# Patient Record
Sex: Male | Born: 1950 | Race: White | Hispanic: No | Smoking: Former smoker
Health system: Southern US, Community
[De-identification: ages and names within clinical notes are randomized; demographics above are authoritative.]

## PROBLEM LIST (undated history)

## (undated) DIAGNOSIS — I4821 Permanent atrial fibrillation: Secondary | ICD-10-CM

## (undated) DIAGNOSIS — I219 Acute myocardial infarction, unspecified: Secondary | ICD-10-CM

## (undated) DIAGNOSIS — J449 Chronic obstructive pulmonary disease, unspecified: Secondary | ICD-10-CM

## (undated) DIAGNOSIS — I4891 Unspecified atrial fibrillation: Secondary | ICD-10-CM

## (undated) DIAGNOSIS — K635 Polyp of colon: Secondary | ICD-10-CM

## (undated) DIAGNOSIS — E785 Hyperlipidemia, unspecified: Secondary | ICD-10-CM

## (undated) DIAGNOSIS — J8410 Pulmonary fibrosis, unspecified: Secondary | ICD-10-CM

## (undated) DIAGNOSIS — J45909 Unspecified asthma, uncomplicated: Secondary | ICD-10-CM

## (undated) DIAGNOSIS — K219 Gastro-esophageal reflux disease without esophagitis: Secondary | ICD-10-CM

## (undated) DIAGNOSIS — Z9289 Personal history of other medical treatment: Secondary | ICD-10-CM

## (undated) DIAGNOSIS — D649 Anemia, unspecified: Secondary | ICD-10-CM

## (undated) DIAGNOSIS — I2510 Atherosclerotic heart disease of native coronary artery without angina pectoris: Secondary | ICD-10-CM

## (undated) DIAGNOSIS — I10 Essential (primary) hypertension: Secondary | ICD-10-CM

## (undated) HISTORY — PX: CORONARY ARTERY BYPASS GRAFT: SHX141

## (undated) HISTORY — DX: Permanent atrial fibrillation: I48.21

## (undated) HISTORY — PX: CARDIAC CATHETERIZATION: SHX172

## (undated) HISTORY — PX: TESTICLE SURGERY: SHX794

## (undated) HISTORY — DX: Pulmonary fibrosis, unspecified: J84.10

## (undated) HISTORY — DX: Atherosclerotic heart disease of native coronary artery without angina pectoris: I25.10

## (undated) HISTORY — DX: Hyperlipidemia, unspecified: E78.5

## (undated) HISTORY — PX: COLONOSCOPY: SHX174

## (undated) HISTORY — DX: Morbid (severe) obesity due to excess calories: E66.01

## (undated) HISTORY — DX: Polyp of colon: K63.5

## (undated) HISTORY — PX: WRIST SURGERY: SHX841

## (undated) HISTORY — DX: Chronic obstructive pulmonary disease, unspecified: J44.9

## (undated) HISTORY — DX: Unspecified asthma, uncomplicated: J45.909

## (undated) HISTORY — PX: TUMOR EXCISION: SHX421

## (undated) HISTORY — DX: Anemia, unspecified: D64.9

## (undated) HISTORY — DX: Gastro-esophageal reflux disease without esophagitis: K21.9

## (undated) HISTORY — DX: Essential (primary) hypertension: I10

## (undated) HISTORY — DX: Personal history of other medical treatment: Z92.89

## (undated) HISTORY — PX: ANKLE SURGERY: SHX546

---

## 2004-10-07 ENCOUNTER — Emergency Department: Payer: Self-pay | Admitting: General Practice

## 2005-07-20 ENCOUNTER — Other Ambulatory Visit: Payer: Self-pay

## 2005-07-26 ENCOUNTER — Ambulatory Visit: Payer: Self-pay | Admitting: Urology

## 2006-08-04 ENCOUNTER — Emergency Department: Payer: Self-pay | Admitting: Emergency Medicine

## 2007-01-25 ENCOUNTER — Other Ambulatory Visit: Payer: Self-pay

## 2007-02-01 ENCOUNTER — Ambulatory Visit: Payer: Self-pay | Admitting: Orthopedic Surgery

## 2010-04-27 ENCOUNTER — Encounter: Payer: Self-pay | Admitting: Cardiovascular Disease

## 2010-04-27 LAB — CONVERTED CEMR LAB: Hgb A1c MFr Bld: 6.2 %

## 2010-05-18 HISTORY — PX: CARDIAC CATHETERIZATION: SHX172

## 2010-05-27 ENCOUNTER — Encounter: Payer: Self-pay | Admitting: Cardiovascular Disease

## 2010-05-27 LAB — CONVERTED CEMR LAB
ALT: 32 units/L
ALT: 49 units/L
AST: 49 units/L
Albumin: 3.9 g/dL
Alkaline Phosphatase: 67 units/L
BUN: 20 mg/dL
CO2: 29 meq/L
Chloride: 110 meq/L
Cholesterol: 105 mg/dL
Creatinine, Ser: 1.2 mg/dL
Eosinophils Relative: 0.2 %
GFR calc Af Amer: 79 mL/min
GFR calc non Af Amer: 66 mL/min
HCT: 42.9 %
HDL: 25 mg/dL
Hemoglobin: 14.2 g/dL
LDL Cholesterol: 31 mg/dL
Lymphocytes, automated: 4.1 %
MCV: 86 fL
Monocytes Relative: 0.7 %
Neutrophils Relative %: 5 %
Platelets: 183 10*3/uL
Potassium: 3.6 meq/L
RBC: 4.96 M/uL
Sodium: 144 meq/L
TSH: 2.59 microintl units/mL
Total Protein: 6.5 g/dL
Triglyceride fasting, serum: 249 mg/dL
WBC: 9.9 10*3/uL

## 2010-06-03 ENCOUNTER — Ambulatory Visit: Payer: Self-pay | Admitting: Cardiovascular Disease

## 2010-06-03 DIAGNOSIS — E785 Hyperlipidemia, unspecified: Secondary | ICD-10-CM | POA: Insufficient documentation

## 2010-06-03 DIAGNOSIS — R0602 Shortness of breath: Secondary | ICD-10-CM | POA: Insufficient documentation

## 2010-06-03 DIAGNOSIS — I4891 Unspecified atrial fibrillation: Secondary | ICD-10-CM | POA: Insufficient documentation

## 2010-06-03 DIAGNOSIS — E1169 Type 2 diabetes mellitus with other specified complication: Secondary | ICD-10-CM | POA: Insufficient documentation

## 2010-06-07 ENCOUNTER — Observation Stay: Payer: Self-pay | Admitting: Internal Medicine

## 2010-06-07 ENCOUNTER — Telehealth: Payer: Self-pay | Admitting: Cardiovascular Disease

## 2010-06-07 ENCOUNTER — Ambulatory Visit: Payer: Self-pay | Admitting: Cardiovascular Disease

## 2010-06-08 ENCOUNTER — Encounter: Payer: Self-pay | Admitting: Cardiovascular Disease

## 2010-06-09 ENCOUNTER — Encounter: Payer: Self-pay | Admitting: Cardiovascular Disease

## 2010-06-10 ENCOUNTER — Encounter: Payer: Self-pay | Admitting: Cardiovascular Disease

## 2010-06-16 ENCOUNTER — Ambulatory Visit: Payer: Self-pay | Admitting: Cardiovascular Disease

## 2010-06-16 DIAGNOSIS — I25111 Atherosclerotic heart disease of native coronary artery with angina pectoris with documented spasm: Secondary | ICD-10-CM | POA: Insufficient documentation

## 2010-06-23 ENCOUNTER — Emergency Department: Payer: Self-pay | Admitting: Hematology & Oncology

## 2010-06-27 ENCOUNTER — Encounter: Payer: Self-pay | Admitting: Cardiovascular Disease

## 2010-06-30 ENCOUNTER — Ambulatory Visit: Payer: Self-pay | Admitting: Cardiovascular Disease

## 2010-07-27 ENCOUNTER — Telehealth: Payer: Self-pay | Admitting: Cardiovascular Disease

## 2010-07-27 ENCOUNTER — Ambulatory Visit: Payer: Self-pay | Admitting: Internal Medicine

## 2010-07-27 ENCOUNTER — Inpatient Hospital Stay (HOSPITAL_COMMUNITY): Admission: EM | Admit: 2010-07-27 | Discharge: 2010-07-30 | Payer: Self-pay | Admitting: Emergency Medicine

## 2010-08-11 ENCOUNTER — Telehealth: Payer: Self-pay | Admitting: Cardiovascular Disease

## 2010-08-12 ENCOUNTER — Ambulatory Visit: Payer: Self-pay | Admitting: Cardiovascular Disease

## 2010-09-20 ENCOUNTER — Ambulatory Visit: Payer: Self-pay | Admitting: Cardiovascular Disease

## 2010-09-20 DIAGNOSIS — R0789 Other chest pain: Secondary | ICD-10-CM | POA: Insufficient documentation

## 2010-09-20 DIAGNOSIS — R079 Chest pain, unspecified: Secondary | ICD-10-CM | POA: Insufficient documentation

## 2010-09-21 ENCOUNTER — Encounter: Payer: Self-pay | Admitting: Cardiovascular Disease

## 2010-09-21 ENCOUNTER — Emergency Department: Payer: Self-pay | Admitting: Internal Medicine

## 2010-10-10 ENCOUNTER — Encounter: Payer: Self-pay | Admitting: Cardiovascular Disease

## 2010-10-10 ENCOUNTER — Ambulatory Visit: Payer: Self-pay | Admitting: Cardiovascular Disease

## 2011-01-17 NOTE — Letter (Signed)
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 [address]   [place], [place] [address]   Phone: [phone]  Fax: [phone]    09/20/2010  TO: Leodis Sias IT MAY CONCERN   RE: Ernest [name] [address] [facility] [address]   The above named individual is under my medical care and may return to work on: October 5,2011 without restrictions.   If you have any further questions or need additional information, please call.     Sincerely,     Dossie Arbour, MD

## 2011-01-17 NOTE — Letter (Signed)
Summary: Discharge Summary  Discharge Summary   Imported By: Park Breed 06/13/2010 08:38:40  _____________________________________________________________________  External Attachment:    Type:   Image     Comment:   External Document

## 2011-01-17 NOTE — Assessment & Plan Note (Signed)
Summary: F3W/AMD   Visit Type:  Follow-up Primary Provider:  Dossie Arbour  CC:  c/o always having chest pain;" somedays worse than others"..  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 Dossie Arbour, h/o chronic atrial fibrillation, diabetes, hypertension, OSA who does not use CPAP, hyperlipidemia, had a cardiac catheterization at [facility] 8 years ago at which time he was told his arteries are narrowing., With recent cardiac catheterization at [facility] , also with repeat cath at [facility] this summer for chest pain. He presents for routine followup.  On his last visit to the clinic, he had significant chest pain. He could not go to the emergency room that day and had no relief with nitroglycerin. He went to the emergency room the next day and had relief with morphine. He was started on Ranexa and continued on Imdur 90 mg daily. Since then, he reports very occasional episodes of chest discomfort and feels well. He is active, and working.  The cardiac cath in 2011 showed small tone/narrowed vessels.  mild dampening on engagement of the left main, improved after intracoronary nitroglycerin. 30% left main disease, diffuse 50% mid third LAD disease, with moderate diffuse plaquing. The first diagonal had 30% disease at the ostium. The circumflex had mild to moderate disease diffusely in the proximal to mid region, 60% stenosis in the mid third of the vessel. The RCA had diffuse 50% disease in the mid third of the vessel. Normal LV gram. Medical management was recommended.  EKG shows atrial fibrillation with ventricular rate 70s beats per minute, nonspecific ST and T wave changes in leads 2, 3, aVF  Current Medications (verified): 1)  Aspirin 81 Mg Tbec (Aspirin) .... Take One Tablet By Mouth Daily 2)  Atenolol 25 Mg Tabs (Atenolol) .... Take One Tablet By Mouth Daily 3)  Gabapentin 600 Mg Tabs  (Gabapentin) .... Take 2-3  Tablet By Mouth At Bedtime 4)  Metformin Hcl 500 Mg Tabs (Metformin Hcl) .... Take 1 Tablet By Mouth Two Times A Day 5)  Ramipril 5 Mg Caps (Ramipril) .... Take One Capsule By Mouth Daily 6)  Lipitor 40 Mg Tabs (Atorvastatin Calcium) .... Take One Tablet By Mouth Daily. 7)  Digoxin 0.25 Mg Tabs (Digoxin) .... Take Two Tablets X One Dose Today Then Take One Tablet By Mouth Daily 8)  Nitroglycerin 0.4 Mg/hr Pt24 (Nitroglycerin) .... As Needed 9)  Isosorbide Mononitrate Cr 30 Mg Xr24h-Tab (Isosorbide Mononitrate) .... Take Three Tablets Once Daily 10)  Amlodipine Besylate 10 Mg Tabs (Amlodipine Besylate) .... Take One Tablet By Mouth Daily 11)  Furosemide 40 Mg Tabs (Furosemide) .... Take 1 Tablet By Mouth Once A Day Can Take 1 Extra Dose Daily As Needed For Sob or Edema. 12)  Potassium Chloride Crys Cr 20 Meq Cr-Tabs (Potassium Chloride Crys Cr) .... Take One Tablet By Mouth Twice A Day 13)  Ranexa 500 Mg Xr12h-Tab (Ranolazine) .... One Tablet Once Daily 14)  Coumadin 5 Mg Tabs (Warfarin Sodium) .... Take As Directed 15)  Ranexa 1000 Mg Xr12h-Tab (Ranolazine) .... Take One Tablet By Mouth Twice A Day 16)  Prilosec 20 Mg Cpdr (Omeprazole) .... Take 1 Tablet By Mouth Once A Day  Allergies (verified): No Known Drug Allergies  Past History:  Past Medical History: Last updated: 06/16/2010 Hypertension Hyperlipidemia CAD Atrial Fibrillation G E R D sleep apnea Type II Diabetes  Past Surgical History: Last updated: 06/16/2010 wrist surg. benign  tumor neck and finger  Family History: Last updated: 06/03/2010 Family History of Alcoholism:  Family History of Coronary Artery Disease:  Family History of Depression:  Family History of Diabetes:  Family History of Hyperlipidemia:  Family History of Hypertension:   Social History: Last updated: 06/16/2010 Full Time Married  Tobacco Use - Former.  Alcohol Use - no Regular Exercise - no Drug Use -  no  Risk Factors: Exercise: no (06/03/2010)  Risk Factors: Smoking Status: quit (06/03/2010)  Review of Systems  The patient denies fever, weight loss, weight gain, vision loss, decreased hearing, hoarseness, chest pain, syncope, dyspnea on exertion, peripheral edema, prolonged cough, abdominal pain, incontinence, muscle weakness, depression, and enlarged lymph nodes.         Rare chest pains  Vital Signs:  Patient profile:   60 year old male Height:      70 inches Weight:      253 pounds BMI:     36.43 Pulse (ortho):   80 / minute BP sitting:   110 / 72  (left arm) Cuff size:   large  Vitals Entered By: Bishop Dublin, CMA (October 10, 2010 11:26 AM)  Physical Exam  General:  Well developed, well nourished, in no acute distress. Head:  normocephalic and atraumatic Neck:  Neck supple, no JVD. No masses, thyromegaly or abnormal cervical nodes. Lungs:  Clear bilaterally to auscultation and percussion. Heart:  Non-displaced PMI, chest non-tender; regular rate and rhythm, S1, S2 without murmurs, rubs or gallops. Carotid upstroke normal, no bruit.  Pedals normal pulses. Trace edema, no varicosities. Abdomen:   abdomen soft and non-tender without masses, obese Msk:  Back normal, normal gait. Muscle strength and tone normal. Pulses:  pulses normal in all 4 extremities Extremities:  No clubbing or cyanosis. Neurologic:  Alert and oriented x 3. Skin:  Intact without lesions or rashes. Psych:  Normal affect.   Impression & Recommendations:  Problem # 1:  CHEST PAIN-UNSPECIFIED (ICD-786.50) no significant chest pain since his last visit. He appears to be doing well on Ranexa and isosorbide. We will continue these medications.  His updated medication list for this problem includes:    Aspirin 81 Mg Tbec (Aspirin) .Marland Kitchen... Take one tablet by mouth daily    Atenolol 25 Mg Tabs (Atenolol) .Marland Kitchen... Take one tablet by mouth daily    Ramipril 5 Mg Caps (Ramipril) .Marland Kitchen... Take one capsule by  mouth daily    Nitroglycerin 0.4 Mg/hr Pt24 (Nitroglycerin) .Marland Kitchen... As needed    Isosorbide Mononitrate Cr 30 Mg Xr24h-tab (Isosorbide mononitrate) .Marland Kitchen... Take three tablets once daily    Amlodipine Besylate 10 Mg Tabs (Amlodipine besylate) .Marland Kitchen... Take one tablet by mouth daily    Ranexa 1000 Mg Xr12h-tab (Ranolazine) .Marland Kitchen... Take 1 tablet by mouth two times a day    Coumadin 5 Mg Tabs (Warfarin sodium) .Marland Kitchen... Take as directed    Ranexa 1000 Mg Xr12h-tab (Ranolazine) .Marland Kitchen... Take one tablet by mouth twice a day  Problem # 2:  CAD, NATIVE VESSEL (ICD-414.01) He does have mild diffuse disease with significant narrowing/small tapered vessels. Cardiac catheter x2 showing no lesions to intervene upon.  His updated medication list for this problem includes:    Aspirin 81 Mg Tbec (Aspirin) .Marland Kitchen... Take one tablet by mouth daily    Atenolol 25 Mg Tabs (Atenolol) .Marland Kitchen... Take one tablet by mouth daily    Ramipril 5 Mg Caps (Ramipril) .Marland Kitchen... Take one capsule by mouth daily    Nitroglycerin 0.4 Mg/hr Pt24 (Nitroglycerin) .Marland Kitchen... As  needed    Isosorbide Mononitrate Cr 30 Mg Xr24h-tab (Isosorbide mononitrate) .Marland Kitchen... Take three tablets once daily    Amlodipine Besylate 10 Mg Tabs (Amlodipine besylate) .Marland Kitchen... Take one tablet by mouth daily    Ranexa 1000 Mg Xr12h-tab (Ranolazine) .Marland Kitchen... Take 1 tablet by mouth two times a day    Coumadin 5 Mg Tabs (Warfarin sodium) .Marland Kitchen... Take as directed    Ranexa 1000 Mg Xr12h-tab (Ranolazine) .Marland Kitchen... Take one tablet by mouth twice a day  Problem # 3:  HYPERLIPIDEMIA-MIXED (ICD-272.4) We'll continue him on Lipitor and this is becoming generic medication later this year.  His updated medication list for this problem includes:    Lipitor 40 Mg Tabs (Atorvastatin calcium) .Marland Kitchen... Take one tablet by mouth daily.  Problem # 4:  ATRIAL FIBRILLATION (ICD-427.31) He has good rate control. He is on anticoagulation. We have mentioned to him that if he would like to consider a cardioversion next  month, if he has shortness of breath, this could be done. He would like to think about this.  His updated medication list for this problem includes:    Aspirin 81 Mg Tbec (Aspirin) .Marland Kitchen... Take one tablet by mouth daily    Atenolol 25 Mg Tabs (Atenolol) .Marland Kitchen... Take one tablet by mouth daily    Digoxin 0.25 Mg Tabs (Digoxin) .Marland Kitchen... Take two tablets x one dose today then take one tablet by mouth daily    Coumadin 5 Mg Tabs (Warfarin sodium) .Marland Kitchen... Take as directed  Patient Instructions: 1)  Your physician has recommended you make the following change in your medication: INCREASE ranexa 1000mg  two times a day  2)  Your physician wants you to follow-up in:   6 months You will receive a reminder letter in the mail two months in advance. If you don't receive a letter, please call our office to schedule the follow-up appointment. Prescriptions: RANEXA 1000 MG XR12H-TAB (RANOLAZINE) Take 1 tablet by mouth two times a day  #60 x 12   Entered by:   Benedict Needy, RN   Authorized by:   Dossie Arbour MD   Signed by:   Benedict Needy, RN on 10/10/2010   Method used:   Electronically to        [facility]. # [phone]* (retail)       [address]       [place], [place]  [address]       Ph: [phone]       Fax: [phone]   RxID:   [id number]

## 2011-01-17 NOTE — Assessment & Plan Note (Signed)
Summary: F/U POST ER/ALT   Visit Type:  post ER Primary Provider:  Dossie Arbour  CC:  chest pain everyday....sob does says it is better though now that he is on Lasix....no edema.  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  The cardiac catheterization showed a left coronary system that had small tone/narrowed vessels. There was mild dampening on engagement of the left main, improved after intracoronary nitroglycerin. There was 30% left main disease, diffuse 50% mid third LAD disease, with moderate diffuse plaquing. The first diagonal had 30% disease at the ostium. The circumflex had mild to moderate disease diffusely in the proximal to mid region, 60% stenosis in the mid third of the vessel. The RCA had diffuse 50% disease in the mid third of the vessel. Normal LV gram, mild mitral regurgitation. Medical management was recommended with the start of long-acting nitroglycerin.  He was recently evaluated in the emergency room after a episode of significant shortness of breath with some syncope. He reports at nighttime having shortness of breath and having to breathe very hard. In the emergency room, I discussed the case with the hospitalist and his Imdur was increased to b.i.d. and Lasix was continued on a daily basis.  Today he reports having continued shortness of breath though it is improved. He is very hot at work, working in [employer] and has drink plenty of fluids. He is able to sleep better, does have significant snoring but does not use his CPAP.  EKG shows atrial  fibrillation with ventricular rate 62 beats per minute, ST-T wave changes seen in V1 through V6, 2, 3, aVF  Current Medications (verified): 1)  Aspirin 81 Mg Tbec (Aspirin) .... Take One Tablet By Mouth Daily 2)  Atenolol 25 Mg Tabs (Atenolol) .... Take One Tablet By Mouth Daily 3)  Gabapentin 600 Mg Tabs (Gabapentin) .... Take 2-3  Tablet By Mouth At Bedtime 4)  Metformin Hcl 500 Mg Tabs (Metformin Hcl) .... Take 1 Tablet By Mouth Two Times A Day 5)  Ramipril 5 Mg Caps (Ramipril) .... Take One Capsule By Mouth Daily 6)  Lipitor 40 Mg Tabs (Atorvastatin Calcium) .... Take One Tablet By Mouth Daily. 7)  Pradaxa 150 Mg Caps (Dabigatran Etexilate Mesylate) .... Take 1 Tablet By Mouth Two Times A Day 8)  Digoxin 0.25 Mg Tabs (Digoxin) .... Take Two Tablets X One Dose Today Then Take One Tablet By Mouth Daily 9)  Nitroglycerin 0.4 Mg/hr Pt24 (Nitroglycerin) .... As Needed 10)  Imdur 30 Mg Xr24h-Tab (Isosorbide Mononitrate) .Marland Kitchen.. 1 Once Daily 11)  Isosorbide Mononitrate Cr 30 Mg Xr24h-Tab (Isosorbide Mononitrate) .... Take 1 Tablet By Mouth Two Times A Day 12)  Amlodipine Besylate 10 Mg Tabs (Amlodipine Besylate) .... Take One Tablet By Mouth Daily 13)  Furosemide 20 Mg Tabs (Furosemide) .Marland Kitchen.. 1 Tab Once Daily  Allergies: No Known Drug Allergies  Past History:  Past Surgical History: Last updated: 06/16/2010 wrist surg. benign tumor neck and finger  Review of Systems       The patient complains of syncope, dyspnea on exertion, and peripheral edema.  The patient denies fever, weight loss, weight gain, vision  loss, decreased hearing, hoarseness, chest pain, prolonged cough, abdominal pain, incontinence, muscle weakness, depression, and enlarged lymph nodes.    Vital Signs:  Patient profile:   60 year old male Height:      70 inches Weight:      268 pounds BMI:     38.59 Pulse rate:   62 / minute Pulse rhythm:   irregular BP sitting:   157 / 76  (left arm) Cuff size:   large  Vitals  Entered By: Danielle Rankin, CMA (June 30, 2010 10:38 AM)  Physical Exam  General:  Well developed, well nourished, in no acute distress. Head:  normocephalic and atraumatic Neck:  Neck supple, no JVD. No masses, thyromegaly or abnormal cervical nodes. Lungs:  Clear bilaterally to auscultation and percussion. Heart:  Non-displaced PMI, chest non-tender; regular rate and rhythm, S1, S2 without murmurs, rubs or gallops. Carotid upstroke normal, no bruit. Pedals normal pulses. 1 to 2+ edema LE b/l, no varicosities. Abdomen:  Bowel sounds positive; abdomen soft and non-tender without masses Msk:  Back normal, normal gait. Muscle strength and tone normal. Pulses:  pulses normal in all 4 extremities Extremities:  No clubbing or cyanosis. Neurologic:  Alert and oriented x 3. Skin:  Intact without lesions or rashes. Psych:  Normal affect.    Impression & Recommendations:  Problem # 1:  SHORTNESS OF BREATH (ICD-786.05) shortness of breath is likely due to underlying diastolic dysfunction from his atrial fibrillation. We will increase his Lasix to 20 mg b.i.d. and add potassium 20 mEq b.i.d. We have talked to him about salt and water/fluid intake and that he needs to check his legs for edema to determine when he can go back to one water pill a day.  The following medications were removed from the medication list:    Aspirin 81 Mg Tbec (Aspirin) .Marland Kitchen... Take one tablet by mouth daily His updated medication list for this problem includes:    Aspirin 81 Mg Tbec (Aspirin) .Marland Kitchen... Take one tablet by mouth daily    Atenolol 25 Mg Tabs (Atenolol) .Marland Kitchen... Take one tablet by mouth daily    Ramipril 5 Mg Caps (Ramipril) .Marland Kitchen... Take one capsule by mouth daily    Digoxin 0.25 Mg Tabs (Digoxin) .Marland Kitchen... Take two tablets x one dose today then take one tablet by mouth daily    Amlodipine Besylate 10 Mg Tabs (Amlodipine besylate) .Marland Kitchen... Take one tablet by mouth daily    Furosemide 20 Mg Tabs (Furosemide) .Marland Kitchen... Take 1  tablet by mouth two times a day  Problem # 2:  ATRIAL FIBRILLATION (ICD-427.31) We discussed cardioversion. He does have obstructive sleep apnea and has fluid overload currently. He would likely not hold his sinus rhythm if we need to diuresis him more. He is not interested in going on his CPAP on a consistent basis. This would likely make him prone to convert back to atrial fibrillation in the future. He will call us in the next few weeks if you would like to consider cardioversion. He needs to have a foot surgery in the near future and will need to come off his anticoagulation.  The following medications were removed from the medication list:    Aspirin 81 Mg Tbec (Aspirin) .Marland Kitchen... Take one tablet by mouth daily His updated medication list for this problem includes:    Aspirin 81 Mg Tbec (Aspirin) .Marland Kitchen... Take one tablet by mouth daily    Atenolol 25 Mg Tabs (Atenolol) .Marland Kitchen... Take one tablet by mouth daily  Digoxin 0.25 Mg Tabs (Digoxin) .Marland Kitchen... Take two tablets x one dose today then take one tablet by mouth daily  Problem # 3:  CAD, NATIVE VESSEL (ICD-414.01) He has improved chest discomfort on the higher dose isosorbide b.i.d. We'll continue to watch him and treat him aggressively  The following medications were removed from the medication list:    Aspirin 81 Mg Tbec (Aspirin) .Marland Kitchen... Take one tablet by mouth daily His updated medication list for this problem includes:    Aspirin 81 Mg Tbec (Aspirin) .Marland Kitchen... Take one tablet by mouth daily    Atenolol 25 Mg Tabs (Atenolol) .Marland Kitchen... Take one tablet by mouth daily    Ramipril 5 Mg Caps (Ramipril) .Marland Kitchen... Take one capsule by mouth daily    Nitroglycerin 0.4 Mg/hr Pt24 (Nitroglycerin) .Marland Kitchen... As needed    Imdur 30 Mg Xr24h-tab (Isosorbide mononitrate) .Marland Kitchen... 1 once daily    Isosorbide Mononitrate Cr 30 Mg Xr24h-tab (Isosorbide mononitrate) .Marland Kitchen... Take 1 tablet by mouth two times a day    Amlodipine Besylate 10 Mg Tabs (Amlodipine besylate) .Marland Kitchen... Take one tablet  by mouth daily  Problem # 4:  HYPERLIPIDEMIA-MIXED (ICD-272.4) Goal LDL is less than 70, total cholesterol less than 150. We'll continue the Lipitor for now  His updated medication list for this problem includes:    Lipitor 40 Mg Tabs (Atorvastatin calcium) .Marland Kitchen... Take one tablet by mouth daily.  Patient Instructions: 1)  Your physician recommends that you schedule a follow-up appointment in: 6 months 2)  Your physician has recommended you make the following change in your medication: change furosemide to two times a day and start Potassium 20 MEQ two times a day  Prescriptions: POTASSIUM CHLORIDE CRYS CR 20 MEQ CR-TABS (POTASSIUM CHLORIDE CRYS CR) Take one tablet by mouth twice a day  #60 x 6   Entered by:   Hardin Negus, RMA   Authorized by:   Dossie Arbour MD   Signed by:   Hardin Negus, RMA on 06/30/2010   Method used:   Electronically to        [facility]. # [phone]* (retail)       [address]       [place], [place]  [address]       Ph: [phone]       Fax: [phone]   RxID:   [id number] FUROSEMIDE 20 MG TABS (FUROSEMIDE) Take 1 tablet by mouth two times a day  #60 x 6   Entered by:   Hardin Negus, RMA   Authorized by:   Dossie Arbour MD   Signed by:   Hardin Negus, RMA on 06/30/2010   Method used:   Electronically to        [facility]. # [phone]* (retail)       [address]       [place], [place]  [address]       Ph: [phone]       Fax: [phone]   RxID:   [id number]

## 2011-01-17 NOTE — Letter (Signed)
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 [address]   [place], [place] [address]   Phone: [phone]  Fax: [phone]    06/10/2010  TO: Leodis Sias IT MAY CONCERN   RE: Ernest [name] [address] [id number]   The above named individual is under my medical care and may return to work on:  June 13, 2010 with no restrictions.  After cardiac catherization on June 21,2011 no heavy lifting for 1 week.   If you have any further questions or need additional information, please call.     Sincerely,      Dossie Arbour, MD

## 2011-01-17 NOTE — Letter (Signed)
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 [address]   [place], [place] [address]   Phone: [phone]  Fax: [phone]    06/27/2010  TO: Leodis Sias IT MAY CONCERN   RE: Ernest [name] [address] [id number]   The above named individual is under my medical care and may return to work on: June 27, 2010 with no restrictions  If you have any further questions or need additional information, please call.     Sincerely,      Dossie Arbour, MD

## 2011-01-17 NOTE — Consult Note (Signed)
SummaryScientist, physiological [facility]   [facility]   Imported By: Roderic Ovens 10/03/2010 10:58:13  _____________________________________________________________________  External Attachment:    Type:   Image     Comment:   External Document

## 2011-01-17 NOTE — Letter (Signed)
Summary: PHI  PHI   Imported By: Harlon Flor 06/08/2010 08:32:50  _____________________________________________________________________  External Attachment:    Type:   Image     Comment:   External Document

## 2011-01-17 NOTE — Cardiovascular Report (Signed)
Summary: Cardiac Cath Other  Cardiac Cath Other   Imported By: West Carbo 06/16/2010 07:41:52  _____________________________________________________________________  External Attachment:    Type:   Image     Comment:   External Document

## 2011-01-17 NOTE — Medication Information (Signed)
Summary: Tax adviser   Imported By: West Carbo 06/16/2010 14:28:55  _____________________________________________________________________  External Attachment:    Type:   Image     Comment:   External Document

## 2011-01-17 NOTE — Progress Notes (Signed)
  Phone Note Call from Patient   Caller: Patient Call For: [facility] Summary of Call: PT called is having chest pain that started last night is also having numbness in hand.  Advised him to go to ER to be evaluated.  Initial call taken by: Benedict Needy, RN,  June 07, 2010 1:19 PM     Appended Document:  Seen at [facility]. Cath on Thursday

## 2011-01-17 NOTE — Progress Notes (Signed)
Summary: Chest pain  Phone Note Call from Patient   Caller: Patient Call For: [place] Summary of Call: Pt called reporting sharpe chest pain, nausea and tingling in left hand. The pain is worse when he is working and moving but does not completly go away with rest.  Has prescription for nitro but pt does not have the medication with him. Asked pt to go to ER for evaluation.  Initial call taken by: Benedict Needy, RN,  July 27, 2010 12:16 PM     Appended Document: Chest pain At [facility] for cath tomrrow

## 2011-01-17 NOTE — Assessment & Plan Note (Signed)
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 Dossie [name], h/o recent chronic atrial fibrillation, diabetes, hypertension, OSA, hyperlipidemia, had a cardiac catheterization at [facility] 8 years ago at which time he was told his arteries are narrowing., With recent cardiac catheterization at [facility] , also with repeat cath at [facility] this past month for chest pain. He presents for routine followup.  he states that since he has left the hospital, he has been feeling well. He denies having to use nitroglycerin for chest pain. He is tolerating his medications well. He sometimes has lower extremity edema, sometimes shortness of breath. He has been back at work and just goes slower as not able to keep his usual.  The cardiac catheterization showed a left coronary system that had small tone/narrowed vessels. There was mild dampening on engagement of the left main, improved after intracoronary nitroglycerin. There was 30% left main disease, diffuse 50% mid third LAD disease, with moderate diffuse plaquing. The first diagonal had 30% disease at the ostium. The circumflex had mild to moderate disease diffusely in the proximal to mid region, 60% stenosis in the mid third of the vessel. The RCA had diffuse 50% disease in the mid third of the vessel. Normal LV gram, mild mitral regurgitation. Medical management was recommended with the start of long-acting nitroglycerin.   EKG shows atrial fibrillation with ventricular rate 60 beats per minute, nonspecific ST and T wave changes in leads 2, 3, aVF  Current Medications (verified): 1)  Aspirin 81 Mg Tbec (Aspirin) .... Take One Tablet By Mouth Daily 2)  Atenolol 25 Mg Tabs (Atenolol) .... Take One Tablet By Mouth Daily 3)  Gabapentin 600 Mg  Tabs (Gabapentin) .... Take 2-3  Tablet By Mouth At Bedtime 4)  Metformin Hcl 500 Mg Tabs (Metformin Hcl) .... Take 1 Tablet By Mouth Two Times A Day 5)  Ramipril 5 Mg Caps (Ramipril) .... Take One Capsule By Mouth Daily 6)  Lipitor 40 Mg Tabs (Atorvastatin Calcium) .... Take One Tablet By Mouth Daily. 7)  Digoxin 0.25 Mg Tabs (Digoxin) .... Take Two Tablets X One Dose Today Then Take One Tablet By Mouth Daily 8)  Nitroglycerin 0.4 Mg/hr Pt24 (Nitroglycerin) .... As Needed 9)  Isosorbide Mononitrate Cr 30 Mg Xr24h-Tab (Isosorbide Mononitrate) .... Take Three Tablets Once Daily 10)  Amlodipine Besylate 10 Mg Tabs (Amlodipine Besylate) .... Take One Tablet By Mouth Daily 11)  Furosemide 40 Mg Tabs (Furosemide) .... Take One Tablet Once Daily 12)  Potassium Chloride Crys Cr 20 Meq Cr-Tabs (Potassium Chloride Crys Cr) .... Take One Tablet By Mouth Twice A Day 13)  Ranexa 500 Mg Xr12h-Tab (Ranolazine) .... One Tablet Once Daily 14)  Coumadin 6 Mg Tabs (Warfarin Sodium) .... One Tablet Once Daily  Allergies (verified): No Known Drug Allergies  Past History:  Past Medical History: Last updated: 06/16/2010 Hypertension Hyperlipidemia CAD Atrial Fibrillation G E R D sleep apnea Type II Diabetes  Past Surgical History: Last updated: 06/16/2010 wrist surg. benign tumor neck and finger  Family History: Last updated: 06/03/2010 Family History of Alcoholism:  Family History of Coronary Artery Disease:  Family History of Depression:  Family History of Diabetes:  Family History of Hyperlipidemia:  Family History of Hypertension:   Social History: Last updated: 06/16/2010 Full Time Married  Tobacco  Use - Former.  Alcohol Use - no Regular Exercise - no Drug Use - no  Risk Factors: Exercise: no (06/03/2010)  Risk Factors: Smoking Status: quit (06/03/2010)  Review of Systems       The patient complains of dyspnea on exertion and peripheral edema.  The patient denies fever,  weight loss, weight gain, vision loss, decreased hearing, hoarseness, chest pain, syncope, prolonged cough, abdominal pain, incontinence, muscle weakness, depression, and enlarged lymph nodes.    Vital Signs:  Patient profile:   60 year old male Height:      70 inches Weight:      258 pounds BMI:     37.15 Pulse rate:   75 / minute BP sitting:   116 / 73  (left arm) Cuff size:   large  Vitals Entered By: Bishop Dublin, CMA (August 12, 2010 10:17 AM)  Physical Exam  General:  Well developed, well nourished, in no acute distress. Obese Head:  normocephalic and atraumatic Neck:  Neck supple, no JVD. No masses, thyromegaly or abnormal cervical nodes. Lungs:  Clear bilaterally to auscultation and percussion. Heart:  Non-displaced PMI, chest non-tender; regular rate and rhythm, S1, S2 without murmurs, rubs or gallops. Carotid upstroke normal, no bruit.  Pedals normal pulses. Trace edema, no varicosities. Abdomen:   abdomen soft and non-tender without masses Msk:  Back normal, normal gait. Muscle strength and tone normal. Pulses:  pulses normal in all 4 extremities Extremities:  No clubbing or cyanosis. Neurologic:  Alert and oriented x 3. Skin:  Intact without lesions or rashes. Psych:  Normal affect.   Impression & Recommendations:  Problem # 1:  CAD, NATIVE VESSEL (ICD-414.01) we will continue him on aggressive lipid management, nitrates, ranexa for small vessel disease, decreased tone of the coronary arterie also with suspected coronary spasm. Symptoms are relatively stable. We will increase his ranexa to 1000 mg b.i.d. with a co-pay Card.  The following medications were removed from the medication list:    Imdur 30 Mg Xr24h-tab (Isosorbide mononitrate) .Marland Kitchen... 1 once daily His updated medication list for this problem includes:    Aspirin 81 Mg Tbec (Aspirin) .Marland Kitchen... Take one tablet by mouth daily    Atenolol 25 Mg Tabs (Atenolol) .Marland Kitchen... Take one tablet by mouth daily    Ramipril 5  Mg Caps (Ramipril) .Marland Kitchen... Take one capsule by mouth daily    Nitroglycerin 0.4 Mg/hr Pt24 (Nitroglycerin) .Marland Kitchen... As needed    Isosorbide Mononitrate Cr 30 Mg Xr24h-tab (Isosorbide mononitrate) .Marland Kitchen... Take three tablets once daily    Amlodipine Besylate 10 Mg Tabs (Amlodipine besylate) .Marland Kitchen... Take one tablet by mouth daily    Ranexa 500 Mg Xr12h-tab (Ranolazine) ..... One tablet once daily    Coumadin 6 Mg Tabs (Warfarin sodium) ..... One tablet once daily    Ranexa 1000 Mg Xr12h-tab (Ranolazine) .Marland Kitchen... Take one tablet by mouth twice a day  Orders: EKG w/ Interpretation (93000)  Problem # 2:  HYPERLIPIDEMIA-MIXED (ICD-272.4) Goal LDL is less than 70, total cholesterol less than 150.  His updated medication list for this problem includes:    Lipitor 40 Mg Tabs (Atorvastatin calcium) .Marland Kitchen... Take one tablet by mouth daily.  Problem # 3:  ATRIAL FIBRILLATION (ICD-427.31) He continues to be in atrial fibrillation with rate controlled. No significant symptoms. We will not pursue cardioversion at this time as he is asymptomatic and I am concerned that it would not hold.  His updated medication list for this problem includes:    Aspirin 81 Mg  Tbec (Aspirin) .Marland Kitchen... Take one tablet by mouth daily    Atenolol 25 Mg Tabs (Atenolol) .Marland Kitchen... Take one tablet by mouth daily    Digoxin 0.25 Mg Tabs (Digoxin) .Marland Kitchen... Take two tablets x one dose today then take one tablet by mouth daily    Coumadin 6 Mg Tabs (Warfarin sodium) ..... One tablet once daily  Orders: EKG w/ Interpretation (93000)  Problem # 4:  SHORTNESS OF BREATH (ICD-786.05) He does not have a smoking history. I suspect his shortness of breath is due to underlying obesity, deconditioning, mild fluid overload with diastolic dysfunction  His updated medication list for this problem includes:    Aspirin 81 Mg Tbec (Aspirin) .Marland Kitchen... Take one tablet by mouth daily    Atenolol 25 Mg Tabs (Atenolol) .Marland Kitchen... Take one tablet by mouth daily    Ramipril 5 Mg  Caps (Ramipril) .Marland Kitchen... Take one capsule by mouth daily    Digoxin 0.25 Mg Tabs (Digoxin) .Marland Kitchen... Take two tablets x one dose today then take one tablet by mouth daily    Amlodipine Besylate 10 Mg Tabs (Amlodipine besylate) .Marland Kitchen... Take one tablet by mouth daily    Furosemide 40 Mg Tabs (Furosemide) .Marland Kitchen... Take 1 tablet by mouth once a day can take 1 extra dose daily as needed for sob or edema.  Patient Instructions: 1)  Your physician has recommended you make the following change in your medication: START ranexa, can also take extra dose of lasix for sob or edema.  2)  Your physician wants you to follow-up in:   6 months You will receive a reminder letter in the mail two months in advance. If you don't receive a letter, please call our office to schedule the follow-up appointment. Prescriptions: RANEXA 1000 MG XR12H-TAB (RANOLAZINE) Take one tablet by mouth twice a day  #60 x 4   Entered by:   Benedict Needy, RN   Authorized by:   Ernest Arbour MD   Signed by:   Benedict Needy, RN on 08/12/2010   Method used:   Electronically to        [facility] [address] (retail)       [address], [address]       [place], [place]  [address]       Ph: [phone]       Fax: [phone]   RxID:   [id number]

## 2011-01-17 NOTE — Progress Notes (Signed)
Summary: rx verification  Phone Note From Pharmacy   Caller: Patient Reason for Call: Talk to Nurse Caller: Candice Camp. # [phone]* Summary of Call: pt's rx from 8-13 for lasix-was written for 1 tab to take an extra 1/2 tab the 13th, 14th and 15th, however he just brought it in today-so they want to verfiy the instructions-pls call [phone] Initial call taken by: Glynda Jaeger,  August 11, 2010 2:09 PM  Follow-up for Phone Call        discharge paperwork says  .Lasix 40 mg p.o. daily (please take 20 mg p.o. q.p.m. for the next     3 days, July 30, 2010, July 31, 2010, and August 01, 2010).     Appended Document: rx verification attempted to call back line busy  Appended Document: rx verification pharmacy aware of MD recomendation for furosemide

## 2011-01-17 NOTE — Assessment & Plan Note (Signed)
Summary: POST HOSPITAL F/U 1 WEEK   Visit Type:  Follow-up Primary Provider:  Crissman  CC:  c/o chest pain .  History of Present Illness: 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 Given his stuttering symptoms of pain, he had a cardiac catheter.  The cardiac catheterization showed a left coronary system that had small tone/narrowed vessels. There was mild dampening on engagement of the left main, improved after intracoronary nitroglycerin. There was 30% left main disease, diffuse 50% mid third LAD disease, with moderate diffuse plaquing. The first diagonal had 30% disease at the ostium. The circumflex had mild to moderate disease diffusely in the proximal to mid region, 60% stenosis in the mid third of the vessel. The RCA had diffuse 50% disease in the mid third of the vessel. Normal LV gram, mild mitral regurgitation. Medical management was recommended with the start of long-acting nitroglycerin for his decreased tone.  at discharge, he was started on Imdur 30 mg daily. His Peridex was only being taken 150 mg daily and we will change this to 150 mg b.i.d. He has had good rate control on atenolol 25 mg daily with heart rates in the 60s to 70s. Overall he is back at work or driving his truck with very rare episodes of chest pain. He had to take his nitroglycerin on one occasion but otherwise feels well.  he does have a history of obstructive sleep apnea but does not use his CPAP on a regular basis   EKG shows atrial fibrillation with ventricular rate of 74 beats per minute, T-wave abnormality through the  anterior and precordial leads, leads 2, 3, aVF  Current Medications (verified): 1)  Aspirin 81 Mg Tbec (Aspirin) .... Take One Tablet By Mouth Daily 2)  Atenolol 25 Mg Tabs (Atenolol) .... Take One Tablet By Mouth Daily 3)  Gabapentin 600 Mg Tabs (Gabapentin) .... Take 2-3  Tablet By Mouth At Bedtime 4)  Hydrochlorothiazide 25 Mg Tabs (Hydrochlorothiazide) .... Take One Tablet By Mouth Daily. 5)  Metformin Hcl 500 Mg Tabs (Metformin Hcl) .... Take 1 Tablet By Mouth Two Times A Day 6)  Ramipril 5 Mg Caps (Ramipril) .... Take One Capsule By Mouth Daily 7)  Lipitor 40 Mg Tabs (Atorvastatin Calcium) .... Take One Tablet By Mouth Daily. 8)  Pradaxa 150 Mg Caps (Dabigatran Etexilate Mesylate) .... Take 1 Tablet By Mouth Once A Day 9)  Digoxin 0.25 Mg Tabs (Digoxin) .... Take Two Tablets X One Dose Today Then Take One Tablet By Mouth Daily 10)  Nitroglycerin 0.4 Mg/hr Pt24 (Nitroglycerin) .... As Needed 11)  Imdur 30 Mg Xr24h-Tab (Isosorbide Mononitrate) .Marland Kitchen.. 1 Once Daily  Allergies (verified): No Known Drug Allergies  Past History:  Family History: Last updated: 06/03/2010 Family History of Alcoholism:  Family History of Coronary Artery Disease:  Family History of Depression:  Family History of Diabetes:  Family History of Hyperlipidemia:  Family History of Hypertension:   Social History: Last updated: 06/16/2010 Full Time Married  Tobacco Use - Former.  Alcohol Use - no Regular Exercise - no Drug Use - no  Risk Factors: Exercise: no (06/03/2010)  Risk Factors: Smoking Status: quit (06/03/2010)  Past  Medical History: Hypertension Hyperlipidemia CAD Atrial Fibrillation G E R D sleep apnea Type II Diabetes  Past Surgical History: wrist surg. benign tumor neck and finger  Social History: Full Time Married  Tobacco Use - Former.  Alcohol Use - no Regular Exercise - no Drug Use - no  Review of Systems       The patient complains of chest pain.  The patient  denies fever, weight loss, weight gain, vision loss, decreased hearing, hoarseness, syncope, dyspnea on exertion, peripheral edema, prolonged cough, abdominal pain, incontinence, muscle weakness, depression, and enlarged lymph nodes.    Vital Signs:  Patient profile:   60 year old male Height:      70 inches Weight:      265 pounds BMI:     38.16 Pulse rate:   89 / minute BP sitting:   135 / 94  (right arm) Cuff size:   large  Vitals Entered By: Bishop Dublin, CMA (June 16, 2010 10:08 AM)  Physical Exam  General:  Well developed, well nourished, in no acute distress. Head:  normocephalic and atraumatic Neck:  Neck supple, no JVD. No masses, thyromegaly or abnormal cervical nodes. Lungs:  Clear bilaterally to auscultation and percussion. Heart:  Non-displaced PMI, chest non-tender; regular rate and rhythm, S1, S2 without murmurs, rubs or gallops. Carotid upstroke normal, no bruit. Pedals normal pulses. No edema, no varicosities. Abdomen:  Bowel sounds positive; abdomen soft and non-tender without masses Msk:  Back normal, normal gait. Muscle strength and tone normal. Pulses:  pulses normal in all 4 extremities Extremities:  No clubbing or cyanosis. Neurologic:  Alert and oriented x 3. Skin:  Intact without lesions or rashes. Psych:  Normal affect.   Impression & Recommendations:  Problem # 1:  CAD, NATIVE VESSEL (ICD-414.01) cardiac catheterization showing moderate diffuse three-vessel disease with significant decreased tone of his left main, LAD and left circumflex. His chest pain has improved on long-acting nitroglycerin though he continues to have some symptoms.  We will increase his isosorbide nitrate to 30 mg b.i.d. We'll also change his HCTZ to amlodipine 10 mg daily to help reduce any spasm.  I have asked him to contact me if he continues to have worsening episodes of chest discomfort.  His updated medication list for this problem includes:    Aspirin 81 Mg Tbec  (Aspirin) .Marland Kitchen... Take one tablet by mouth daily    Atenolol 25 Mg Tabs (Atenolol) .Marland Kitchen... Take one tablet by mouth daily    Ramipril 5 Mg Caps (Ramipril) .Marland Kitchen... Take one capsule by mouth daily    Nitroglycerin 0.4 Mg/hr Pt24 (Nitroglycerin) .Marland Kitchen... As needed    Imdur 30 Mg Xr24h-tab (Isosorbide mononitrate) .Marland Kitchen... 1 once daily    Isosorbide Mononitrate Cr 30 Mg Xr24h-tab (Isosorbide mononitrate) .Marland Kitchen... Take 1 tablet by mouth two times a day    Amlodipine Besylate 10 Mg Tabs (Amlodipine besylate) .Marland Kitchen... Take one tablet by mouth daily  Problem # 2:  ATRIAL FIBRILLATION (ICD-427.31) We will change his prescription for Pradaxa to 150 mg by mouth two times a day. He has reasonable rate control on atenolol We will also add aspirin 81 mg daily given his underlying coronary disease.  His updated medication list for this problem includes:    Aspirin 81 Mg Tbec (Aspirin) .Marland Kitchen... Take one tablet by mouth daily    Atenolol 25 Mg Tabs (Atenolol) .Marland Kitchen... Take one tablet by mouth daily    Digoxin 0.25 Mg Tabs (Digoxin) .Marland Kitchen... Take two tablets x one dose  today then take one tablet by mouth daily  Problem # 3:  HYPERLIPIDEMIA-MIXED (ICD-272.4) cholesterol is well controlled on Lipitor.  His updated medication list for this problem includes:    Lipitor 40 Mg Tabs (Atorvastatin calcium) .Marland Kitchen... Take one tablet by mouth daily.  Patient Instructions: 1)  Your physician has recommended you make the following change in your medication: HOLD Hctz until further notice. START pradaxa two times a day, isosorbide two times a day and amolodipine daily  2)  Your physician wants you to follow-up in: 6 months   You will receive a reminder letter in the mail two months in advance. If you don't receive a letter, please call our office to schedule the follow-up appointment. Prescriptions: PRADAXA 150 MG CAPS (DABIGATRAN ETEXILATE MESYLATE) Take 1 tablet by mouth two times a day  #90 x 0   Entered by:   Benedict Needy, RN   Authorized  by:   Dossie Arbour MD   Signed by:   Dossie Arbour MD on 06/16/2010   Method used:   Samples Given   RxID:   [id number] AMLODIPINE BESYLATE 10 MG TABS (AMLODIPINE BESYLATE) Take one tablet by mouth daily  #30 x 6   Entered by:   Benedict Needy, RN   Authorized by:   Dossie Arbour MD   Signed by:   Benedict Needy, RN on 06/16/2010   Method used:   Electronically to        [facility]  [address] (retail)       [address]       [place], [place]  [address]       Ph: [phone]       Fax: [phone]   RxID:   [id number] ISOSORBIDE MONONITRATE CR 30 MG XR24H-TAB (ISOSORBIDE MONONITRATE) Take 1 tablet by mouth two times a day  #60 x 6   Entered by:   Benedict Needy, RN   Authorized by:   Dossie Arbour MD   Signed by:   Benedict Needy, RN on 06/16/2010   Method used:   Electronically to        [facility]  [address] (retail)       [address]       [place], [place]  [address]       Ph: [phone]       Fax: [phone]   RxID:   [id number] PRADAXA 150 MG CAPS (DABIGATRAN ETEXILATE MESYLATE) Take 1 tablet by mouth two times a day  #60 x 6   Entered by:   Benedict Needy, RN   Authorized by:   Dossie Arbour MD   Signed by:   Benedict Needy, RN on 06/16/2010   Method used:   Electronically to        [facility]  [address] (retail)       [address]       [place], [place]  [address]       Ph: [phone]       Fax: [phone]   RxID:   [id number]   Appended Document: [facility] F/U 1 WEEK echocardiogram from 06/08/2010: Normal LV and RV size and systolic function, ejection fraction greater than 55%, normal left atrium and right atrium. Trace mitral and tricuspid valve regurgitation. Normal right ventricular systolic pressure.

## 2011-01-17 NOTE — Letter (Signed)
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 [address]   [place], [place] [address]   Phone: [phone]  Fax: [phone]    June 03, 2010   Employee:  DELEON PASSE    To Whom It May Concern:   For Medical reasons, please excuse the above named employee from work for the following dates:  Start:   05/27/10  End:   06/05/10    Patient may return to work on 06/06/10.  If you need additional information, please feel free to contact our office.         Sincerely,       Dossie Arbour, M.D.

## 2011-01-17 NOTE — Assessment & Plan Note (Signed)
Summary: NEW PT   Visit Type:  Initial Consult Primary Provider:  Crissman  CC:  Adnormal heartrate.  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 He has a history of diabetes, hypertension, OSA, hyperlipidemia, had a cardiac catheterization at [facility] 8 years ago at which time he was told his arteries are narrowing.  He reports having shortness of breath for the past several months. He is unable to work hard on his job and 2 to moderate fatigue and breathing problems with exertion. He was recently changed from atenolol 25 mg to metoprolol XL 50 mg daily.he denies any coughing, no lower extremity edema, no significant lightheadedness or dizziness. Predominantly his symptoms are shortness of breath with exertion.  he does have a history of obstructive sleep apnea but does not use his CPAP on a regular basis as he takes at Dr. Terrilee Croak and toes on the floor.  EKG from June 10 shows atrial fibrillation with ventricular rate 92 beats per minute, rare PVC, diffuse ST and T wave abnormality noted in anterolateral leads, inferiorly  Preventive Screening-Counseling & Management  Alcohol-Tobacco     Smoking Status: quit  Caffeine-Diet-Exercise     Does Patient Exercise: no      Drug Use:  no.    Current Medications (verified): 1)  Aspirin 81 Mg Tbec (Aspirin) .... Take One Tablet By Mouth Daily 2)  Atenolol 25 Mg Tabs (Atenolol) .... Take One Tablet By Mouth Daily 3)  Gabapentin 600 Mg Tabs (Gabapentin) .... Take 2-3  Tablet By Mouth At Bedtime 4)  Hydrochlorothiazide 25 Mg Tabs (Hydrochlorothiazide) .... Take One Tablet By Mouth Daily. 5)  Metformin Hcl 500 Mg Tabs (Metformin Hcl) .... Take 1 Tablet By Mouth Two Times A Day 6)  Ramipril 5 Mg Caps (Ramipril) .... Take One Capsule By Mouth Daily 7)  Lipitor 40 Mg Tabs  (Atorvastatin Calcium) .... Take One Tablet By Mouth Daily. 8)  Pradaxa 150 Mg Caps (Dabigatran Etexilate Mesylate) .... Take 1 Tablet By Mouth Once A Day  Allergies (verified): No Known Drug Allergies  Family History: Family History of Alcoholism:  Family History of Coronary Artery Disease:  Family History of Depression:  Family History of Diabetes:  Family History of Hyperlipidemia:  Family History of Hypertension:   Social History: Full Time Married  Tobacco Use - Former. Stopped 16 years ago Alcohol Use - no Regular Exercise - no Drug Use - no Smoking Status:  quit Does Patient Exercise:  no Drug Use:  no  Review of Systems       The patient complains of dyspnea on exertion.  The patient denies fever, weight loss, weight gain, vision loss, decreased hearing, hoarseness, chest pain, syncope, peripheral edema, prolonged cough, abdominal pain, incontinence, muscle weakness, depression, and enlarged lymph nodes.    Vital Signs:  Patient profile:   60 year old male Height:      70 inches Weight:      267 pounds BMI:     38.45 Pulse rate:   66 / minute BP sitting:   132 / 87  (left arm) Cuff size:   large  Vitals Entered By: Ernest Needy, RN (June 03, 2010 3:04 PM)  Physical Exam  General:  Well developed, well nourished, in no acute distress. Head:  normocephalic and atraumatic Neck:  Neck supple, no JVD. No masses, thyromegaly or abnormal cervical  nodes. Lungs:  Clear bilaterally to auscultation and percussion. Heart:  Non-displaced PMI, chest non-tender; regular rate and rhythm, S1, S2 without murmurs, rubs or gallops. Carotid upstroke normal, no bruit. Normal abdominal aortic size, no bruits. . Pedals normal pulses. No edema, no varicosities. Abdomen:  Bowel sounds positive; abdomen soft and non-tender without masses Msk:  Back normal, normal gait. Muscle strength and tone normal. Pulses:  pulses normal in all 4 extremities Extremities:  No clubbing or  cyanosis. Neurologic:  Alert and oriented x 3. Skin:  Intact without lesions or rashes. Psych:  Normal affect.   Impression & Recommendations:  Problem # 1:  ATRIAL FIBRILLATION (ICD-427.31) atrial fibrillation of uncertain duration, likely several months as he has had dyslipidemia for this time. His heart rate is mildly elevated and we will add digoxin to his regiment.  He does have a history of obstructive sleep apnea and is not using his CPAP. This may have contributed to the onset of his atrial fibrillation. We will obtain an echocardiogram to evaluate his valves, left atrial size to determine if we could cardiovert him in the future. He is on pradaxa with no symptoms.  we will try to obtain the cardiac catheter report from 8 years ago at [facility]. He is possible that he may have underlying ischemia and we will talk to him about a stress test on followup evaluation.  His updated medication list for this problem includes:    Aspirin 81 Mg Tbec (Aspirin) .Marland Kitchen... Take one tablet by mouth daily   Metoprolol XL 50 mg by mouth daily    Digoxin 0.25 Mg Tabs (Digoxin) .Marland Kitchen... Take two tablets x one dose today then take one tablet by mouth daily  Orders: Echocardiogram (Echo)  Problem # 2:  HYPERLIPIDEMIA-MIXED (ICD-272.4) currently tolerating Lipitor well. Most recent LDL is 31, total cholesterol 105. Triglycerides 249. I did mention to him that a very low cholesterol may also decrease his testosterone level though to talk with Dr. Dossie Arbour whether he needs a testosterone level check.  His updated medication list for this problem includes:    Lipitor 40 Mg Tabs (Atorvastatin calcium) .Marland Kitchen... Take one tablet by mouth daily.  Patient Instructions: 1)  Your physician recommends that you schedule a follow-up appointment in: 1 month 2)  Your physician has requested that you have an echocardiogram.  Echocardiography is a painless test that uses sound waves to create images of your heart. It  provides your doctor with information about the size and shape of your heart and how well your heart's chambers and valves are working.  This procedure takes approximately one hour. There are no restrictions for this procedure. 3)  Your physician has recommended you make the following change in your medication: Start taking Digoxin 0.25mg  take two tablets x 1 dose today then Take 1 tablet by mouth once a day  Prescriptions: DIGOXIN 0.25 MG TABS (DIGOXIN) Take two tablets x one dose today then take one tablet by mouth daily  #32 x 6   Entered by:   Ernest [name], CMA   Authorized by:   Dossie Arbour MD   Signed by:   Ernest [name], CMA on 06/03/2010   Method used:   Electronically to        [facility]  [address] (retail)       [address]       [place], [place]  [address]       Ph:  [phone]       Fax: [phone]   RxID:   [id number]

## 2011-01-17 NOTE — Assessment & Plan Note (Signed)
Summary: F1M/AMD   Visit Type:  Follow-up Primary Ernest [name]:  Ernest [name]  CC:  c/o chest pain and shortness of breath with feeling fatigue.  Symptoms started yesterday and had some chest pain while waiting in the lobby on a scale from 1-10 a (7); denies chest pain now. He also states had some pounding in chest last night. .  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 He presents for routine followup.  He reports that he has been feeling very fatigued and with malaise the past 2 days. Symptoms started on Sunday. He had similar symptoms yesterday while at work. People at work reported that he did not look well. He reported having tachycardia palpitations. Today in the office, he reports having 7/10 chest pain. It started earlier this morning as well as a little bit last night. Nitroglycerin spray x3 was given in the office with no improvement. We offered to transition him to the emergency room but he preferred to go home. We did discuss that the emergency room would likely start him on a blood thinner, nitro paste, check cardiac enzymes and possibly perform a chest CT.   work has given him a difficult time about missing time due to doctor visits.  The cardiac cath showed small tone/narrowed vessels.  mild dampening on engagement of the left main, improved after intracoronary nitroglycerin. 30% left main disease, diffuse 50% mid third LAD disease, with moderate diffuse plaquing. The first diagonal had 30% disease at the ostium. The circumflex had mild to moderate disease  diffusely in the proximal to mid region, 60% stenosis in the mid third of the vessel. The RCA had diffuse 50% disease in the mid third of the vessel. Normal LV gram. Medical management was recommended.  EKG shows atrial fibrillation with ventricular rate 70s beats per minute, nonspecific ST and T wave changes in leads 2, 3, aVF  Current Medications (verified): 1)  Aspirin 81 Mg Tbec (Aspirin) .... Take One Tablet By Mouth Daily 2)  Atenolol 25 Mg Tabs (Atenolol) .... Take One Tablet By Mouth Daily 3)  Gabapentin 600 Mg Tabs (Gabapentin) .... Take 2-3  Tablet By Mouth At Bedtime 4)  Metformin Hcl 500 Mg Tabs (Metformin Hcl) .... Take 1 Tablet By Mouth Two Times A Day 5)  Ramipril 5 Mg Caps (Ramipril) .... Take One Capsule By Mouth Daily 6)  Lipitor 40 Mg Tabs (Atorvastatin Calcium) .... Take One Tablet By Mouth Daily. 7)  Digoxin 0.25 Mg Tabs (Digoxin) .... Take Two Tablets X One Dose Today Then Take One Tablet By Mouth Daily 8)  Nitroglycerin 0.4 Mg/hr Pt24 (Nitroglycerin) .... As Needed 9)  Isosorbide Mononitrate Cr 30 Mg Xr24h-Tab (Isosorbide Mononitrate) .... Take Three Tablets Once Daily 10)  Amlodipine Besylate 10 Mg Tabs (Amlodipine Besylate) .... Take One Tablet By Mouth Daily 11)  Furosemide 40 Mg Tabs (Furosemide) .... Take 1 Tablet By Mouth Once A Day Can Take 1 Extra Dose Daily As Needed For Sob or Edema. 12)  Potassium Chloride Crys Cr 20 Meq Cr-Tabs (Potassium Chloride Crys Cr) .... Take One Tablet By Mouth Twice A Day 13)  Ranexa 500  Mg Xr12h-Tab (Ranolazine) .... One Tablet Once Daily 14)  Coumadin 6 Mg Tabs (Warfarin Sodium) .... One Tablet Once Daily 15)  Ranexa 1000 Mg Xr12h-Tab (Ranolazine) .... Take One Tablet By Mouth Twice A Day  Allergies (verified): No Known Drug Allergies  Past History:  Past Medical History: Last updated: 06/16/2010 Hypertension Hyperlipidemia CAD Atrial Fibrillation G E R D sleep apnea Type II Diabetes  Past Surgical History: Last  updated: 06/16/2010 wrist surg. benign tumor neck and finger  Family History: Last updated: 06/03/2010 Family History of Alcoholism:  Family History of Coronary Artery Disease:  Family History of Depression:  Family History of Diabetes:  Family History of Hyperlipidemia:  Family History of Hypertension:   Social History: Last updated: 06/16/2010 Full Time Married  Tobacco Use - Former.  Alcohol Use - no Regular Exercise - no Drug Use - no  Risk Factors: Exercise: no (06/03/2010)  Risk Factors: Smoking Status: quit (06/03/2010)  Review of Systems       The patient complains of chest pain and peripheral edema.  The patient denies fever, weight loss, weight gain, vision loss, decreased hearing, hoarseness, syncope, dyspnea on exertion, prolonged cough, abdominal pain, incontinence, muscle weakness, depression, and enlarged lymph nodes.    Vital Signs:  Patient profile:   59 year old male Height:      70 inches Weight:      255 pounds BMI:     36.72 Pulse rate:   76 / minute BP sitting:   130 / 72  (left arm) Cuff size:   large  Vitals Entered By: Bishop Dublin, CMA (September 20, 2010 11:50 AM)  Physical Exam  General:  Well developed, well nourished, in no acute distress. Obese Head:  normocephalic and atraumatic Neck:  Neck supple, no JVD. No masses, thyromegaly or abnormal cervical nodes. Lungs:  Clear bilaterally to auscultation and percussion. Heart:  Non-displaced PMI, chest non-tender; regular rate and rhythm, S1, S2 without murmurs, rubs or gallops. Carotid upstroke normal, no bruit.  Pedals normal pulses. Trace edema, no varicosities. Abdomen:   abdomen soft and non-tender without masses Msk:  Back normal, normal gait. Muscle strength and tone normal. Pulses:  pulses normal in all 4 extremities Extremities:  No clubbing or cyanosis. Neurologic:  Alert and oriented x 3. Skin:  Intact without lesions or rashes. Psych:  Normal affect.   Impression &  Recommendations:  Problem # 1:  CHEST PAIN-UNSPECIFIED (ICD-786.50) etiology of his chest pain is uncertain. He does have coronary spasm. Recent cardiac catheter x2 has shown no specific lesions requiring intervention. He has otherwise been doing well on long-acting nitrates and ranexa.  We did give him nitroglycerin x3 with no improvement of his discomfort. No significant EKG changes apart from chronic atrial fibrillation. We have suggested that he could go to the emergency room. He prefers to go home. We will have him call us in several hours time to let us know if there is further workup that needs to be done at the hospital. We have excused him from work today and suggested if he is pain free, he could go back tomorrow.  His updated medication list for this problem includes:    Aspirin 81 Mg Tbec (Aspirin) .Marland Kitchen... Take one tablet by mouth daily    Atenolol 25 Mg Tabs (Atenolol) .Marland Kitchen... Take one tablet by mouth daily    Ramipril 5 Mg Caps (Ramipril) .Marland Kitchen... Take one capsule by mouth daily    Nitroglycerin 0.4 Mg/hr Pt24 (Nitroglycerin) .Marland Kitchen... As  needed    Isosorbide Mononitrate Cr 30 Mg Xr24h-tab (Isosorbide mononitrate) .Marland Kitchen... Take three tablets once daily    Amlodipine Besylate 10 Mg Tabs (Amlodipine besylate) .Marland Kitchen... Take one tablet by mouth daily    Ranexa 500 Mg Xr12h-tab (Ranolazine) ..... One tablet once daily    Coumadin 6 Mg Tabs (Warfarin sodium) ..... One tablet once daily    Ranexa 1000 Mg Xr12h-tab (Ranolazine) .Marland Kitchen... Take one tablet by mouth twice a day  Problem # 2:  CAD, NATIVE VESSEL (ICD-414.01) Mild diffuse disease. We'll continue aggressive medical management.  His updated medication list for this problem includes:    Aspirin 81 Mg Tbec (Aspirin) .Marland Kitchen... Take one tablet by mouth daily    Atenolol 25 Mg Tabs (Atenolol) .Marland Kitchen... Take one tablet by mouth daily    Ramipril 5 Mg Caps (Ramipril) .Marland Kitchen... Take one capsule by mouth daily    Nitroglycerin 0.4 Mg/hr Pt24 (Nitroglycerin) .Marland Kitchen...  As needed    Isosorbide Mononitrate Cr 30 Mg Xr24h-tab (Isosorbide mononitrate) .Marland Kitchen... Take three tablets once daily    Amlodipine Besylate 10 Mg Tabs (Amlodipine besylate) .Marland Kitchen... Take one tablet by mouth daily    Ranexa 500 Mg Xr12h-tab (Ranolazine) ..... One tablet once daily    Coumadin 6 Mg Tabs (Warfarin sodium) ..... One tablet once daily    Ranexa 1000 Mg Xr12h-tab (Ranolazine) .Marland Kitchen... Take one tablet by mouth twice a day  Problem # 3:  ATRIAL FIBRILLATION (ICD-427.31) He did run out of his warfarin for one week and has recently restarted. We did discuss whether he would like a cardioversion that we cannot perform this for at least one month  His updated medication list for this problem includes:    Aspirin 81 Mg Tbec (Aspirin) .Marland Kitchen... Take one tablet by mouth daily    Atenolol 25 Mg Tabs (Atenolol) .Marland Kitchen... Take one tablet by mouth daily    Digoxin 0.25 Mg Tabs (Digoxin) .Marland Kitchen... Take two tablets x one dose today then take one tablet by mouth daily    Coumadin 6 Mg Tabs (Warfarin sodium) ..... One tablet once daily

## 2011-03-03 LAB — CARDIAC PANEL(CRET KIN+CKTOT+MB+TROPI)
CK, MB: 1 ng/mL (ref 0.3–4.0)
CK, MB: 1.1 ng/mL (ref 0.3–4.0)
CK, MB: 1.3 ng/mL (ref 0.3–4.0)
Relative Index: INVALID (ref 0.0–2.5)
Relative Index: INVALID (ref 0.0–2.5)
Relative Index: INVALID (ref 0.0–2.5)
Total CK: 58 U/L (ref 7–232)
Total CK: 70 U/L (ref 7–232)
Total CK: 78 U/L (ref 7–232)
Troponin I: 0.01 ng/mL (ref 0.00–0.06)
Troponin I: 0.01 ng/mL (ref 0.00–0.06)
Troponin I: <0.01 ng/mL (ref 0.00–0.06)

## 2011-03-03 LAB — CBC
HCT: 38.6 % — ABNORMAL LOW (ref 39.0–52.0)
HCT: 40.1 % (ref 39.0–52.0)
HCT: 40.1 % (ref 39.0–52.0)
Hemoglobin: 12.9 g/dL — ABNORMAL LOW (ref 13.0–17.0)
Hemoglobin: 13.1 g/dL (ref 13.0–17.0)
Hemoglobin: 13.7 g/dL (ref 13.0–17.0)
MCH: 28.5 pg (ref 26.0–34.0)
MCH: 28.9 pg (ref 26.0–34.0)
MCH: 29.3 pg (ref 26.0–34.0)
MCHC: 32.7 g/dL (ref 30.0–36.0)
MCHC: 33.4 g/dL (ref 30.0–36.0)
MCHC: 34.2 g/dL (ref 30.0–36.0)
MCV: 85.7 fL (ref 78.0–100.0)
MCV: 86.5 fL (ref 78.0–100.0)
MCV: 87.4 fL (ref 78.0–100.0)
Platelets: 187 10*3/uL (ref 150–400)
Platelets: 189 10*3/uL (ref 150–400)
Platelets: 201 10*3/uL (ref 150–400)
RBC: 4.46 MIL/uL (ref 4.22–5.81)
RBC: 4.59 MIL/uL (ref 4.22–5.81)
RBC: 4.68 MIL/uL (ref 4.22–5.81)
RDW: 14 % (ref 11.5–15.5)
RDW: 14.2 % (ref 11.5–15.5)
RDW: 14.4 % (ref 11.5–15.5)
WBC: 8 10*3/uL (ref 4.0–10.5)
WBC: 8.3 10*3/uL (ref 4.0–10.5)
WBC: 9.1 10*3/uL (ref 4.0–10.5)

## 2011-03-03 LAB — POCT CARDIAC MARKERS
CKMB, poc: <1 ng/mL — ABNORMAL LOW (ref 1.0–8.0)
CKMB, poc: <1 ng/mL — ABNORMAL LOW (ref 1.0–8.0)
Myoglobin, poc: 60.5 ng/mL (ref 12–200)
Myoglobin, poc: 70.1 ng/mL (ref 12–200)
Troponin i, poc: <0.05 ng/mL (ref 0.00–0.09)
Troponin i, poc: <0.05 ng/mL (ref 0.00–0.09)

## 2011-03-03 LAB — CK TOTAL AND CKMB (NOT AT ARMC)
CK, MB: 1.2 ng/mL (ref 0.3–4.0)
Relative Index: INVALID (ref 0.0–2.5)
Total CK: 89 U/L (ref 7–232)

## 2011-03-03 LAB — BASIC METABOLIC PANEL
BUN: 13 mg/dL (ref 6–23)
BUN: 14 mg/dL (ref 6–23)
BUN: 9 mg/dL (ref 6–23)
CO2: 29 mEq/L (ref 19–32)
CO2: 31 mEq/L (ref 19–32)
CO2: 32 mEq/L (ref 19–32)
Calcium: 8.5 mg/dL (ref 8.4–10.5)
Calcium: 8.7 mg/dL (ref 8.4–10.5)
Calcium: 8.8 mg/dL (ref 8.4–10.5)
Chloride: 101 mEq/L (ref 96–112)
Chloride: 101 mEq/L (ref 96–112)
Chloride: 103 mEq/L (ref 96–112)
Creatinine, Ser: 1 mg/dL (ref 0.4–1.5)
Creatinine, Ser: 1.13 mg/dL (ref 0.4–1.5)
Creatinine, Ser: 1.17 mg/dL (ref 0.4–1.5)
GFR calc Af Amer: >60 mL/min (ref >60)
GFR calc Af Amer: >60 mL/min (ref >60)
GFR calc Af Amer: >60 mL/min (ref >60)
GFR calc non Af Amer: >60 mL/min (ref >60)
GFR calc non Af Amer: >60 mL/min (ref >60)
GFR calc non Af Amer: >60 mL/min (ref >60)
Glucose, Bld: 118 mg/dL — ABNORMAL HIGH (ref 70–99)
Glucose, Bld: 139 mg/dL — ABNORMAL HIGH (ref 70–99)
Glucose, Bld: 159 mg/dL — ABNORMAL HIGH (ref 70–99)
Potassium: 3.5 mEq/L (ref 3.5–5.1)
Potassium: 3.6 mEq/L (ref 3.5–5.1)
Potassium: 3.9 mEq/L (ref 3.5–5.1)
Sodium: 139 mEq/L (ref 135–145)
Sodium: 141 mEq/L (ref 135–145)
Sodium: 142 mEq/L (ref 135–145)

## 2011-03-03 LAB — DIGOXIN LEVEL: Digoxin Level: 1 ng/mL (ref 0.8–2.0)

## 2011-03-03 LAB — GLUCOSE, CAPILLARY
Glucose-Capillary: 108 mg/dL — ABNORMAL HIGH (ref 70–99)
Glucose-Capillary: 117 mg/dL — ABNORMAL HIGH (ref 70–99)
Glucose-Capillary: 130 mg/dL — ABNORMAL HIGH (ref 70–99)
Glucose-Capillary: 137 mg/dL — ABNORMAL HIGH (ref 70–99)
Glucose-Capillary: 146 mg/dL — ABNORMAL HIGH (ref 70–99)
Glucose-Capillary: 151 mg/dL — ABNORMAL HIGH (ref 70–99)

## 2011-03-03 LAB — DIFFERENTIAL
Basophils Absolute: 0 10*3/uL (ref 0.0–0.1)
Basophils Relative: 0 % (ref 0–1)
Eosinophils Absolute: 0.1 10*3/uL (ref 0.0–0.7)
Eosinophils Relative: 2 % (ref 0–5)
Lymphocytes Relative: 37 % (ref 12–46)
Lymphs Abs: 3.3 10*3/uL (ref 0.7–4.0)
Monocytes Absolute: 0.7 10*3/uL (ref 0.1–1.0)
Monocytes Relative: 8 % (ref 3–12)
Neutro Abs: 4.8 10*3/uL (ref 1.7–7.7)
Neutrophils Relative %: 54 % (ref 43–77)

## 2011-03-03 LAB — POCT I-STAT, CHEM 8
BUN: 18 mg/dL (ref 6–23)
Calcium, Ion: 1.08 mmol/L — ABNORMAL LOW (ref 1.12–1.32)
Chloride: 106 mEq/L (ref 96–112)
Creatinine, Ser: 1.1 mg/dL (ref 0.4–1.5)
Glucose, Bld: 111 mg/dL — ABNORMAL HIGH (ref 70–99)
HCT: 40 % (ref 39.0–52.0)
Hemoglobin: 13.6 g/dL (ref 13.0–17.0)
Potassium: 3.7 mEq/L (ref 3.5–5.1)
Sodium: 143 mEq/L (ref 135–145)
TCO2: 28 mmol/L (ref 0–100)

## 2011-03-03 LAB — APTT: aPTT: 33 seconds (ref 24–37)

## 2011-03-03 LAB — BRAIN NATRIURETIC PEPTIDE: Pro B Natriuretic peptide (BNP): 222 pg/mL — ABNORMAL HIGH (ref 0.0–100.0)

## 2011-03-03 LAB — TROPONIN I: Troponin I: 0.01 ng/mL (ref 0.00–0.06)

## 2011-03-03 LAB — MRSA PCR SCREENING: MRSA by PCR: NEGATIVE

## 2011-03-03 LAB — PROTIME-INR
INR: 1.11 (ref 0.00–1.49)
Prothrombin Time: 14.5 seconds (ref 11.6–15.2)

## 2011-03-16 ENCOUNTER — Ambulatory Visit: Payer: Self-pay | Admitting: Nephrology

## 2011-04-10 ENCOUNTER — Emergency Department: Payer: Self-pay | Admitting: Emergency Medicine

## 2011-07-06 ENCOUNTER — Encounter: Payer: Self-pay | Admitting: Cardiovascular Disease

## 2012-03-13 ENCOUNTER — Ambulatory Visit: Payer: Self-pay | Admitting: Family Medicine

## 2012-09-12 ENCOUNTER — Ambulatory Visit: Payer: Self-pay | Admitting: Gastroenterology

## 2012-09-13 LAB — PATHOLOGY REPORT

## 2013-12-26 ENCOUNTER — Ambulatory Visit: Payer: Self-pay

## 2014-01-02 ENCOUNTER — Ambulatory Visit: Payer: Self-pay | Admitting: Family Medicine

## 2014-03-03 ENCOUNTER — Ambulatory Visit: Payer: Self-pay | Admitting: Gastroenterology

## 2014-07-18 HISTORY — PX: CARDIAC CATHETERIZATION: SHX172

## 2014-07-20 ENCOUNTER — Encounter: Payer: Self-pay | Admitting: *Deleted

## 2014-07-21 ENCOUNTER — Ambulatory Visit (INDEPENDENT_AMBULATORY_CARE_PROVIDER_SITE_OTHER): Payer: BC Managed Care – PPO | Admitting: Cardiovascular Disease

## 2014-07-21 ENCOUNTER — Encounter: Payer: Self-pay | Admitting: Cardiovascular Disease

## 2014-07-21 ENCOUNTER — Encounter (INDEPENDENT_AMBULATORY_CARE_PROVIDER_SITE_OTHER): Payer: Self-pay

## 2014-07-21 VITALS — BP 136/92 | HR 100 | Ht 70.0 in | Wt 242.2 lb

## 2014-07-21 DIAGNOSIS — E785 Hyperlipidemia, unspecified: Secondary | ICD-10-CM

## 2014-07-21 DIAGNOSIS — R079 Chest pain, unspecified: Secondary | ICD-10-CM

## 2014-07-21 DIAGNOSIS — I4891 Unspecified atrial fibrillation: Secondary | ICD-10-CM

## 2014-07-21 DIAGNOSIS — I2510 Atherosclerotic heart disease of native coronary artery without angina pectoris: Secondary | ICD-10-CM

## 2014-07-21 DIAGNOSIS — I25119 Atherosclerotic heart disease of native coronary artery with unspecified angina pectoris: Secondary | ICD-10-CM

## 2014-07-21 DIAGNOSIS — I209 Angina pectoris, unspecified: Secondary | ICD-10-CM

## 2014-07-21 MED ORDER — METOPROLOL TARTRATE 25 MG PO TABS: 25.0000 mg | ORAL_TABLET | Freq: Two times a day (BID) | ORAL | Status: DC

## 2014-07-21 NOTE — Assessment & Plan Note (Signed)
The patient has known history of moderate nonobstructive coronary artery disease with no recent cardiac evaluation. He is currently having symptoms with some anginal and some atypical features. I recommend evaluation with a pharmacologic nuclear stress test. He is not able to exercise on a treadmill to get his heart rate up high enough and also he has chronic atrial fibrillation. I will have a low threshold for cardiac catheterization.

## 2014-07-21 NOTE — Assessment & Plan Note (Signed)
Continue treatment with atorvastatin with a target LDL of less than 70. 

## 2014-07-21 NOTE — Patient Instructions (Addendum)
Your physician has recommended you make the following change in your medication:  Start Metoprolol 25 mg one tablet twice daily   [place] caregiver has ordered a Stress Test with nuclear imaging. The purpose of this test is to evaluate the blood supply to your heart muscle. This procedure is referred to as a "Non-Invasive Stress Test." This is because other than having an IV started in your vein, nothing is inserted or "invades" your body. Cardiac stress tests are done to find areas of poor blood flow to the heart by determining the extent of coronary artery disease (CAD). Some patients exercise on a treadmill, which naturally increases the blood flow to your heart, while others who are  unable to walk on a treadmill due to physical limitations have a pharmacologic/chemical stress agent called Lexiscan . This medicine will mimic walking on a treadmill by temporarily increasing your coronary blood flow.   Please note: these test may take anywhere between 2-4 hours to complete  PLEASE REPORT TO [facility] AT THE FIRST DESK WILL DIRECT YOU WHERE TO GO  Date of Procedure:_______8/11/15______________________________  Arrival Time for Procedure:_________0745am_____________________  Instructions regarding medication:   __x__ : Hold diabetes medication morning of procedure    PLEASE NOTIFY THE OFFICE AT LEAST 24 HOURS IN ADVANCE IF YOU ARE UNABLE TO KEEP YOUR APPOINTMENT.  [phone] AND  PLEASE NOTIFY NUCLEAR MEDICINE AT [facility] AT LEAST 24 HOURS IN ADVANCE IF YOU ARE UNABLE TO KEEP YOUR APPOINTMENT. [phone]  How to prepare for your Myoview test:  1. Do not eat or drink after midnight 2. No caffeine for 24 hours prior to test 3. No smoking 24 hours prior to test. 4. Your medication may be taken with water.  If your doctor stopped a medication because of this test, do not take that medication. 5. Ladies, please do not wear dresses.  Skirts or  pants are appropriate. Please wear a short sleeve shirt. 6. No perfume, cologne or lotion. 7. Wear comfortable walking shoes. No heels!

## 2014-07-21 NOTE — Assessment & Plan Note (Signed)
He has chronic atrial fibrillation. Ventricular rate seems to be on the high side. I started metoprolol 25 mg twice daily. Continue anticoagulation with Eliquis.

## 2014-07-21 NOTE — Progress Notes (Signed)
Primary care physician: Dr. 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 He used to be seen by Dr. Rockey Situ but has not been seen since 2011. He has known history of chronic atrial fibrillation, diabetes, hypertension, OSA who does not use CPAP, hyperlipidemia, and coronary artery disease. He had a cardiac catheterization multiple times in the past. Most recent cardiac catheterization in 2011 showed mild dampening on engagement of the left main, improved after intracoronary nitroglycerin. 30% left main disease, diffuse 50% mid third LAD disease, with moderate diffuse plaquing. The first diagonal had 30% disease at the ostium. The circumflex had mild to moderate disease diffusely in the proximal to mid region, 60% stenosis in the mid third of the vessel. The RCA had diffuse 50% disease in the mid third of the vessel. He had noticed recent episodes of substernal chest pain and jaw discomfort with dizziness. He reports having these symptoms both at rest and with physical activities. He is not aware of palpitations. The symptoms are associated with significant dyspnea. He does have family history of premature coronary artery disease.   No Known Allergies   Current Outpatient Prescriptions on File Prior to Visit  Medication Sig Dispense Refill  . amLODipine (NORVASC) 10 MG tablet Take 10 mg by mouth daily.        . furosemide (LASIX) 40 MG tablet Take 40 mg by mouth 2 (two) times daily.       Marland Kitchen gabapentin (NEURONTIN) 600 MG tablet Take 1,200 mg by mouth at bedtime.       . metFORMIN (GLUCOPHAGE) 500 MG tablet Take 1,000 mg by mouth 2 (two) times daily.       Marland Kitchen omeprazole (PRILOSEC) 20 MG capsule Take 20 mg by mouth 2 (two) times daily before a meal.       . ramipril (ALTACE) 5 MG capsule Take 5 mg by mouth daily.         No current facility-administered medications on file  prior to visit.     Past Medical History  Diagnosis Date  . Hypertension   . Hyperlipidemia   . Coronary artery disease   . Atrial fibrillation   . GERD (gastroesophageal reflux disease)   . Sleep apnea   . Diabetes mellitus     Type II  . Asthma   . Carotid artery occlusion      Past Surgical History  Procedure Laterality Date  . Wrist surgery    . Tumor excision      Neck and finger; benign  . Cardiac catheterization  05/2010    [facility]  . Cardiac catheterization      [facility]  . Cardiac catheterization      [facility]  . Ankle surgery       Family History  Problem Relation Age of Onset  . Diabetes Other   . Depression Other   . Coronary artery disease Other   . Alcohol abuse Other   . Hypertension Other   . Hyperlipidemia Other      History   Social History  . Marital Status: Married    Spouse Name: N/A    Number of Children: N/A  . Years of Education: N/A   Occupational History  . Full time    Social History Main Topics  . Smoking status: Former Research scientist (life sciences)  . Smokeless tobacco: Not on file  . Alcohol Use: No  .  Drug Use: No  . Sexual Activity: Not on file   Other Topics Concern  . Not on file   Social History Narrative   No regular exercise.     ROS A 10 point review of system was performed. It is negative other than that mentioned in the history of present illness.   PHYSICAL EXAM   BP 136/92  Pulse 100  Ht 5\' 10"  (1.778 m)  Wt 242 lb 4 oz (109.884 kg)  BMI 34.76 kg/m2 Constitutional: He is oriented to person, place, and time. He appears well-developed and well-nourished. No distress.  HENT: No nasal discharge.  Head: Normocephalic and atraumatic.  Eyes: Pupils are equal and round.  No discharge. Neck: Normal range of motion. Neck supple. No JVD present. No thyromegaly present.  Cardiovascular: Normal rate, regular rhythm, normal heart sounds. Exam reveals no gallop and no friction rub. No murmur heard.  Pulmonary/Chest: Effort normal and  breath sounds normal. No stridor. No respiratory distress. He has no wheezes. He has no rales. He exhibits no tenderness.  Abdominal: Soft. Bowel sounds are normal. He exhibits no distension. There is no tenderness. There is no rebound and no guarding.  Musculoskeletal: Normal range of motion. He exhibits no edema and no tenderness.  Neurological: He is alert and oriented to person, place, and time. Coordination normal.  Skin: Skin is warm and dry. No rash noted. He is not diaphoretic. No erythema. No pallor.  Psychiatric: He has a normal mood and affect. His behavior is normal. Judgment and thought content normal.       KVQ:QVZDGL fibrillation  -irregular conduction  -Nonspecific ST depression   +   Diffuse nonspecific T-abnormality  -Nondiagnostic.   ABNORMAL     ASSESSMENT AND PLAN

## 2014-07-24 ENCOUNTER — Encounter: Payer: Self-pay | Admitting: Nurse Practitioner

## 2014-07-24 ENCOUNTER — Inpatient Hospital Stay: Payer: Self-pay | Admitting: Internal Medicine

## 2014-07-24 DIAGNOSIS — I4891 Unspecified atrial fibrillation: Secondary | ICD-10-CM

## 2014-07-24 DIAGNOSIS — I200 Unstable angina: Secondary | ICD-10-CM

## 2014-07-24 DIAGNOSIS — I10 Essential (primary) hypertension: Secondary | ICD-10-CM

## 2014-07-24 LAB — CBC WITH DIFFERENTIAL/PLATELET
Basophil #: 0.1 10*3/uL (ref 0.0–0.1)
Basophil %: 0.8 %
Eosinophil #: 0.2 10*3/uL (ref 0.0–0.7)
Eosinophil %: 2.5 %
HCT: 40 % (ref 40.0–52.0)
HGB: 12.9 g/dL — ABNORMAL LOW (ref 13.0–18.0)
Lymphocyte #: 4.9 10*3/uL — ABNORMAL HIGH (ref 1.0–3.6)
Lymphocyte %: 52.2 %
MCH: 26.2 pg (ref 26.0–34.0)
MCHC: 32.2 g/dL (ref 32.0–36.0)
MCV: 81 fL (ref 80–100)
Monocyte #: 0.5 x10 3/mm (ref 0.2–1.0)
Monocyte %: 5.2 %
Neutrophil #: 3.7 10*3/uL (ref 1.4–6.5)
Neutrophil %: 39.3 %
Platelet: 170 10*3/uL (ref 150–440)
RBC: 4.91 10*6/uL (ref 4.40–5.90)
RDW: 15.1 % — ABNORMAL HIGH (ref 11.5–14.5)
WBC: 9.3 10*3/uL (ref 3.8–10.6)

## 2014-07-24 LAB — CBC
HCT: 41.5 % (ref 40.0–52.0)
HGB: 13.5 g/dL (ref 13.0–18.0)
MCH: 26.6 pg (ref 26.0–34.0)
MCHC: 32.6 g/dL (ref 32.0–36.0)
MCV: 82 fL (ref 80–100)
Platelet: 172 10*3/uL (ref 150–440)
RBC: 5.08 10*6/uL (ref 4.40–5.90)
RDW: 14.8 % — ABNORMAL HIGH (ref 11.5–14.5)
WBC: 8.5 10*3/uL (ref 3.8–10.6)

## 2014-07-24 LAB — CK TOTAL AND CKMB (NOT AT ARMC)
CK, Total: 111 U/L
CK, Total: 102 U/L
CK, Total: 96 U/L
CK-MB: 1.4 ng/mL (ref 0.5–3.6)
CK-MB: 1.3 ng/mL (ref 0.5–3.6)
CK-MB: 1.2 ng/mL (ref 0.5–3.6)

## 2014-07-24 LAB — BASIC METABOLIC PANEL
Anion Gap: 6 — ABNORMAL LOW (ref 7–16)
BUN: 20 mg/dL — ABNORMAL HIGH (ref 7–18)
Calcium, Total: 8.6 mg/dL (ref 8.5–10.1)
Chloride: 106 mmol/L (ref 98–107)
Co2: 30 mmol/L (ref 21–32)
Creatinine: 1.39 mg/dL — ABNORMAL HIGH (ref 0.60–1.30)
EGFR (African American): >60
EGFR (Non-African Amer.): 53 — ABNORMAL LOW
Glucose: 151 mg/dL — ABNORMAL HIGH (ref 65–99)
Osmolality: 289 (ref 275–301)
Potassium: 3.7 mmol/L (ref 3.5–5.1)
Sodium: 142 mmol/L (ref 136–145)

## 2014-07-24 LAB — DIGOXIN LEVEL: Digoxin: <0.1 ng/mL — ABNORMAL LOW

## 2014-07-24 LAB — PROTIME-INR
INR: 1
INR: 1.1
Prothrombin Time: 13.4 secs (ref 11.5–14.7)
Prothrombin Time: 13.9 secs (ref 11.5–14.7)

## 2014-07-24 LAB — APTT: Activated PTT: 31.8 secs (ref 23.6–35.9)

## 2014-07-24 LAB — HEPARIN LEVEL (UNFRACTIONATED): Anti-Xa(Unfractionated): 0.43 IU/mL (ref 0.30–0.70)

## 2014-07-24 LAB — PRO B NATRIURETIC PEPTIDE: B-Type Natriuretic Peptide: 604 pg/mL — ABNORMAL HIGH (ref 0–125)

## 2014-07-24 LAB — TROPONIN I
Troponin-I: <0.02 ng/mL
Troponin-I: <0.02 ng/mL
Troponin-I: <0.02 ng/mL

## 2014-07-25 LAB — BASIC METABOLIC PANEL
Anion Gap: 5 — ABNORMAL LOW (ref 7–16)
BUN: 16 mg/dL (ref 7–18)
Calcium, Total: 8.9 mg/dL (ref 8.5–10.1)
Chloride: 105 mmol/L (ref 98–107)
Co2: 29 mmol/L (ref 21–32)
Creatinine: 1.21 mg/dL (ref 0.60–1.30)
EGFR (African American): >60
EGFR (Non-African Amer.): >60
Glucose: 125 mg/dL — ABNORMAL HIGH (ref 65–99)
Osmolality: 280 (ref 275–301)
Potassium: 3.7 mmol/L (ref 3.5–5.1)
Sodium: 139 mmol/L (ref 136–145)

## 2014-07-25 LAB — CBC WITH DIFFERENTIAL/PLATELET
Basophil #: 0.1 10*3/uL (ref 0.0–0.1)
Basophil %: 0.6 %
Eosinophil #: 0.2 10*3/uL (ref 0.0–0.7)
Eosinophil %: 2.5 %
HCT: 40.7 % (ref 40.0–52.0)
HGB: 13.6 g/dL (ref 13.0–18.0)
Lymphocyte #: 4.8 10*3/uL — ABNORMAL HIGH (ref 1.0–3.6)
Lymphocyte %: 52.7 %
MCH: 27.1 pg (ref 26.0–34.0)
MCHC: 33.4 g/dL (ref 32.0–36.0)
MCV: 81 fL (ref 80–100)
Monocyte #: 0.4 x10 3/mm (ref 0.2–1.0)
Monocyte %: 4.4 %
Neutrophil #: 3.6 10*3/uL (ref 1.4–6.5)
Neutrophil %: 39.8 %
Platelet: 165 10*3/uL (ref 150–440)
RBC: 5.02 10*6/uL (ref 4.40–5.90)
RDW: 15.4 % — ABNORMAL HIGH (ref 11.5–14.5)
WBC: 9.2 10*3/uL (ref 3.8–10.6)

## 2014-07-25 LAB — TSH: Thyroid Stimulating Horm: 2.53 u[IU]/mL

## 2014-07-25 LAB — LIPID PANEL
Cholesterol: 104 mg/dL (ref 0–200)
HDL Cholesterol: 25 mg/dL — ABNORMAL LOW (ref 40–60)
Ldl Cholesterol, Calc: 30 mg/dL (ref 0–100)
Triglycerides: 243 mg/dL — ABNORMAL HIGH (ref 0–200)
VLDL Cholesterol, Calc: 49 mg/dL — ABNORMAL HIGH (ref 5–40)

## 2014-07-25 LAB — HEPARIN LEVEL (UNFRACTIONATED): Anti-Xa(Unfractionated): 0.38 IU/mL (ref 0.30–0.70)

## 2014-07-25 LAB — MAGNESIUM: Magnesium: 1.6 mg/dL — ABNORMAL LOW

## 2014-07-25 LAB — HEMOGLOBIN A1C: Hemoglobin A1C: 7.5 % — ABNORMAL HIGH (ref 4.2–6.3)

## 2014-07-26 LAB — CBC WITH DIFFERENTIAL/PLATELET
Basophil #: 0 10*3/uL (ref 0.0–0.1)
Basophil %: 0.4 %
Eosinophil #: 0.2 10*3/uL (ref 0.0–0.7)
Eosinophil %: 2.4 %
HCT: 42.9 % (ref 40.0–52.0)
HGB: 14.4 g/dL (ref 13.0–18.0)
Lymphocyte #: 5 10*3/uL — ABNORMAL HIGH (ref 1.0–3.6)
Lymphocyte %: 51.8 %
MCH: 27.1 pg (ref 26.0–34.0)
MCHC: 33.7 g/dL (ref 32.0–36.0)
MCV: 81 fL (ref 80–100)
Monocyte #: 0.5 x10 3/mm (ref 0.2–1.0)
Monocyte %: 5.4 %
Neutrophil #: 3.9 10*3/uL (ref 1.4–6.5)
Neutrophil %: 40 %
Platelet: 170 10*3/uL (ref 150–440)
RBC: 5.32 10*6/uL (ref 4.40–5.90)
RDW: 14.8 % — ABNORMAL HIGH (ref 11.5–14.5)
WBC: 9.7 10*3/uL (ref 3.8–10.6)

## 2014-07-26 LAB — HEPARIN LEVEL (UNFRACTIONATED)
Anti-Xa(Unfractionated): 0.23 IU/mL — ABNORMAL LOW (ref 0.30–0.70)
Anti-Xa(Unfractionated): 0.32 IU/mL (ref 0.30–0.70)

## 2014-07-27 ENCOUNTER — Encounter: Payer: Self-pay | Admitting: Cardiovascular Disease

## 2014-07-27 DIAGNOSIS — I2510 Atherosclerotic heart disease of native coronary artery without angina pectoris: Secondary | ICD-10-CM

## 2014-07-27 DIAGNOSIS — I10 Essential (primary) hypertension: Secondary | ICD-10-CM

## 2014-07-27 DIAGNOSIS — I4891 Unspecified atrial fibrillation: Secondary | ICD-10-CM

## 2014-07-27 LAB — CBC WITH DIFFERENTIAL/PLATELET
Basophil #: 0 10*3/uL (ref 0.0–0.1)
Basophil %: 0.4 %
Eosinophil #: 0.2 10*3/uL (ref 0.0–0.7)
Eosinophil %: 1.6 %
HCT: 43.8 % (ref 40.0–52.0)
HGB: 14.4 g/dL (ref 13.0–18.0)
Lymphocyte #: 4.7 10*3/uL — ABNORMAL HIGH (ref 1.0–3.6)
Lymphocyte %: 43.8 %
MCH: 26.7 pg (ref 26.0–34.0)
MCHC: 32.9 g/dL (ref 32.0–36.0)
MCV: 81 fL (ref 80–100)
Monocyte #: 0.7 x10 3/mm (ref 0.2–1.0)
Monocyte %: 6.5 %
Neutrophil #: 5.2 10*3/uL (ref 1.4–6.5)
Neutrophil %: 47.7 %
Platelet: 186 10*3/uL (ref 150–440)
RBC: 5.4 10*6/uL (ref 4.40–5.90)
RDW: 15 % — ABNORMAL HIGH (ref 11.5–14.5)
WBC: 10.8 10*3/uL — ABNORMAL HIGH (ref 3.8–10.6)

## 2014-07-27 LAB — HEPARIN LEVEL (UNFRACTIONATED): Anti-Xa(Unfractionated): 0.1 IU/mL — ABNORMAL LOW (ref 0.30–0.70)

## 2014-07-28 ENCOUNTER — Telehealth: Payer: Self-pay | Admitting: *Deleted

## 2014-07-28 NOTE — Telephone Encounter (Signed)
  Patient contacted regarding discharge from [facility] on 07/27/14.  Patient understands to follow up with provider Arida on  at 08/11/14 at [facility] . Patient understands discharge instructions? YES Patient understands medications and regiment? YES Patient understands to bring all medications to this visit? YES   Patients [place] discharge instructions state that patient was discharged on Digoxin .25 mg once daily  He states that he was not told about this and is currently not taking this medication  He will take it if needed but wanted me to check with the doctor first

## 2014-07-29 ENCOUNTER — Encounter: Payer: Self-pay | Admitting: Cardiovascular Disease

## 2014-07-29 NOTE — Telephone Encounter (Signed)
Stay on Digoxin until I see him.

## 2014-07-29 NOTE — Telephone Encounter (Signed)
Patients phone number is not currently working 8/12

## 2014-08-04 ENCOUNTER — Ambulatory Visit: Payer: Self-pay | Admitting: Cardiovascular Disease

## 2014-08-05 ENCOUNTER — Encounter: Payer: Self-pay | Admitting: *Deleted

## 2014-08-10 NOTE — Telephone Encounter (Signed)
LVM 8/24

## 2014-08-11 ENCOUNTER — Encounter: Payer: Self-pay | Admitting: Cardiovascular Disease

## 2014-08-11 ENCOUNTER — Ambulatory Visit (INDEPENDENT_AMBULATORY_CARE_PROVIDER_SITE_OTHER): Payer: BC Managed Care – PPO | Admitting: Cardiovascular Disease

## 2014-08-11 VITALS — BP 120/80 | HR 89 | Ht 68.0 in | Wt 262.5 lb

## 2014-08-11 DIAGNOSIS — I209 Angina pectoris, unspecified: Secondary | ICD-10-CM

## 2014-08-11 DIAGNOSIS — I25118 Atherosclerotic heart disease of native coronary artery with other forms of angina pectoris: Secondary | ICD-10-CM

## 2014-08-11 DIAGNOSIS — I10 Essential (primary) hypertension: Secondary | ICD-10-CM | POA: Insufficient documentation

## 2014-08-11 DIAGNOSIS — I2510 Atherosclerotic heart disease of native coronary artery without angina pectoris: Secondary | ICD-10-CM

## 2014-08-11 DIAGNOSIS — I4891 Unspecified atrial fibrillation: Secondary | ICD-10-CM

## 2014-08-11 DIAGNOSIS — R0602 Shortness of breath: Secondary | ICD-10-CM

## 2014-08-11 MED ORDER — AMLODIPINE BESYLATE 5 MG PO TABS: 5.0000 mg | ORAL_TABLET | Freq: Every day | ORAL | Status: DC

## 2014-08-11 MED ORDER — METOPROLOL TARTRATE 50 MG PO TABS: 50.0000 mg | ORAL_TABLET | Freq: Two times a day (BID) | ORAL | Status: DC

## 2014-08-11 NOTE — Assessment & Plan Note (Signed)
The etiology of this is still not entirely clear. Cardiac findings do not explain his symptoms. Possibilities include lung disease or physical deconditioning. Given his occupational exposure, on referring him to pulmonary for evaluation.

## 2014-08-11 NOTE — Assessment & Plan Note (Signed)
I decreased the dose of amlodipine 5 mg daily and increased metoprolol as outlined above.

## 2014-08-11 NOTE — Assessment & Plan Note (Signed)
Continue long-term anticoagulation and rate control. I stopped digoxin and increase metoprolol to 50 mg twice daily.

## 2014-08-11 NOTE — Patient Instructions (Addendum)
Your physician has recommended you make the following change in your medication:  Stop Digoxin  Increase Metoprolol to 50 mg twice daily  Decrease Amlodipine to 5 mg once daily   You have been referred to Pulmonary Clinic  They will call you for appt  If you do not hear from them within a week call me [place] physician recommends that you schedule a follow-up appointment in:  3 months

## 2014-08-11 NOTE — Progress Notes (Signed)
Primary care physician: Dr. Jeananne [name]  HPI  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 He has known history of chronic atrial fibrillation, diabetes, hypertension, OSA who does not use CPAP, hyperlipidemia, and coronary artery disease. He had a cardiac catheterization multiple times in the past.  He was seen recently for episodes of substernal chest pain and jaw discomfort with dizziness. I requested a nuclear stress test. However, he presented to the emergency room at [facility] with worsening chest pain and shortness of breath at rest suggestive of unstable angina. Cardiac enzymes were negative. I proceeded with cardiac catheterization which showed mild left main and RCA disease with no evidence of obstructive disease to explain his symptoms. Left ventricular end-diastolic pressure was slightly elevated at 14. Echocardiogram showed normal LV systolic function with mild tricuspid regurgitation and no evidence of pulmonary hypertension. Digoxin was added before hospital discharge for better rate control. He continues to have dyspnea with minimal activities but no chest pain. He is exposed to dust at work. He quit smoking many years ago.  No Known Allergies   Current Outpatient Prescriptions on File Prior to Visit  Medication Sig Dispense Refill  . amLODipine (NORVASC) 10 MG tablet Take 10 mg by mouth daily.        Ernest [name] apixaban (ELIQUIS) 5 MG TABS tablet Take 5 mg by mouth 2 (two) times daily.      Ernest [name] atorvastatin (LIPITOR) 40 MG tablet Take 40 mg by mouth daily.      . digoxin (LANOXIN) 0.25 MG tablet Take 0.25 mg by mouth daily.      . furosemide (LASIX) 40 MG tablet Take 20 mg by mouth 2 (two) times daily.       Ernest [name] gabapentin (NEURONTIN) 600 MG tablet Take 1,200 mg by mouth at bedtime.       Ernest [name] glipiZIDE (GLUCOTROL) 5 MG tablet Take 5 mg by mouth daily before  breakfast.      . isosorbide mononitrate (IMDUR) 60 MG 24 hr tablet Take 60 mg by mouth daily.      . metFORMIN (GLUCOPHAGE) 500 MG tablet Take 1,000 mg by mouth 2 (two) times daily.       . metoprolol tartrate (LOPRESSOR) 25 MG tablet Take 1 tablet (25 mg total) by mouth 2 (two) times daily.  180 tablet  3  . omeprazole (PRILOSEC) 20 MG capsule Take 20 mg by mouth 2 (two) times daily before a meal.       . ramipril (ALTACE) 5 MG capsule Take 5 mg by mouth daily.        . saxagliptin HCl (ONGLYZA) 5 MG TABS tablet Take 5 mg by mouth daily.       No current facility-administered medications on file prior to visit.     Past Medical History  Diagnosis Date  . Hypertension   . Hyperlipidemia   . Coronary artery disease     a. 05/2010 Cath: LM 30, LAD 50 diff, D1 30 ost, LCX 68m, RCA 45m.  Ernest [name] Atrial fibrillation     a. previously on pradaxa-->switched to eliquis.  Ernest [name] GERD (gastroesophageal reflux disease)   . Sleep apnea     a. does not use CPAP.  . Diabetes mellitus     Type II  . Asthma   . Carotid artery occlusion      Past Surgical History  Procedure Laterality Date  .  Wrist surgery    . Tumor excision      Neck and finger; benign  . Cardiac catheterization  05/2010    [facility]  . Cardiac catheterization      [facility]  . Cardiac catheterization      [facility]  . Ankle surgery    . Cardiac catheterization  07/2014    [facility]     Family History  Problem Relation Age of Onset  . Diabetes Other   . Depression Other   . Coronary artery disease Other   . Alcohol abuse Other   . Hypertension Other   . Hyperlipidemia Other   . Hypertension Mother   . Hyperlipidemia Mother   . Diabetes Mother   . Heart disease Mother     CABG     History   Social History  . Marital Status: Widowed    Spouse Name: N/A    Number of Children: N/A  . Years of Education: N/A   Occupational History  . Full time    Social History Main Topics  . Smoking status: Former Research scientist (life sciences)  . Smokeless tobacco:  Not on file  . Alcohol Use: No  . Drug Use: No  . Sexual Activity: Not on file   Other Topics Concern  . Not on file   Social History Narrative   No regular exercise.     ROS A 10 point review of system was performed. It is negative other than that mentioned in the history of present illness.   PHYSICAL EXAM   BP 120/80  Pulse 89  Ht 5\' 8"  (1.727 m)  Wt 262 lb 8 oz (119.069 kg)  BMI 39.92 kg/m2 Constitutional: He is oriented to person, place, and time. He appears well-developed and well-nourished. No distress.  HENT: No nasal discharge.  Head: Normocephalic and atraumatic.  Eyes: Pupils are equal and round.  No discharge. Neck: Normal range of motion. Neck supple. No JVD present. No thyromegaly present.  Cardiovascular: Normal rate, regular rhythm, normal heart sounds. Exam reveals no gallop and no friction rub. No murmur heard.  Pulmonary/Chest: Effort normal and breath sounds normal. No stridor. No respiratory distress. He has no wheezes. He has no rales. He exhibits no tenderness.  Abdominal: Soft. Bowel sounds are normal. He exhibits no distension. There is no tenderness. There is no rebound and no guarding.  Musculoskeletal: Normal range of motion. He exhibits no edema and no tenderness.  Neurological: He is alert and oriented to person, place, and time. Coordination normal.  Skin: Skin is warm and dry. No rash noted. He is not diaphoretic. No erythema. No pallor.  Psychiatric: He has a normal mood and affect. His behavior is normal. Judgment and thought content normal.  Right radial pulse is normal no hematoma     FFM:BWGYKZ fibrillation  -irregular conduction  - occasional ectopic ventricular beat    -Nonspecific ST depression   +   Nonspecific T-abnormality  -Nondiagnostic.   ABNORMAL     ASSESSMENT AND PLAN

## 2014-08-11 NOTE — Assessment & Plan Note (Signed)
Recent cardiac catheterization showed no significant progression of disease. If anything, there was improvement likely due to previous spasm. Continue medical therapy.

## 2014-08-14 ENCOUNTER — Telehealth: Payer: Self-pay | Admitting: *Deleted

## 2014-08-14 NOTE — Telephone Encounter (Signed)
Informed patient of his appt with Dr. Stevenson Clinch  Sept 8, 2015 at 2:30

## 2014-08-21 NOTE — Telephone Encounter (Signed)
Patient seen in clinic

## 2014-08-25 ENCOUNTER — Ambulatory Visit (INDEPENDENT_AMBULATORY_CARE_PROVIDER_SITE_OTHER): Payer: BC Managed Care – PPO | Admitting: Internal Medicine

## 2014-08-25 ENCOUNTER — Encounter (INDEPENDENT_AMBULATORY_CARE_PROVIDER_SITE_OTHER): Payer: Self-pay

## 2014-08-25 ENCOUNTER — Encounter: Payer: Self-pay | Admitting: Internal Medicine

## 2014-08-25 VITALS — BP 128/70 | HR 83 | Ht 68.0 in | Wt 258.0 lb

## 2014-08-25 DIAGNOSIS — E669 Obesity, unspecified: Secondary | ICD-10-CM

## 2014-08-25 DIAGNOSIS — R0609 Other forms of dyspnea: Secondary | ICD-10-CM

## 2014-08-25 DIAGNOSIS — R0989 Other specified symptoms and signs involving the circulatory and respiratory systems: Secondary | ICD-10-CM

## 2014-08-25 DIAGNOSIS — R05 Cough: Secondary | ICD-10-CM

## 2014-08-25 DIAGNOSIS — R0600 Dyspnea, unspecified: Secondary | ICD-10-CM

## 2014-08-25 DIAGNOSIS — Z9989 Dependence on other enabling machines and devices: Secondary | ICD-10-CM

## 2014-08-25 DIAGNOSIS — R059 Cough, unspecified: Secondary | ICD-10-CM | POA: Insufficient documentation

## 2014-08-25 DIAGNOSIS — R0602 Shortness of breath: Secondary | ICD-10-CM

## 2014-08-25 DIAGNOSIS — G4733 Obstructive sleep apnea (adult) (pediatric): Secondary | ICD-10-CM

## 2014-08-25 DIAGNOSIS — Z6835 Body mass index (BMI) 35.0-35.9, adult: Secondary | ICD-10-CM | POA: Insufficient documentation

## 2014-08-25 DIAGNOSIS — Z6841 Body Mass Index (BMI) 40.0 and over, adult: Secondary | ICD-10-CM | POA: Insufficient documentation

## 2014-08-25 NOTE — Assessment & Plan Note (Signed)
Multifactorial Plan as stated with dyspnea on exertion

## 2014-08-25 NOTE — Assessment & Plan Note (Signed)
Patient states that he wears a CPAP on a nightly basis. From the history it seems as though he may have missed a CPAP titration study this past May, will obtain records from his last sleep study and adjust as necessary.   Encouraged proper weight management.  Excessive weight may contribute to snoring.  Monitor sedative use.  Discussed driving precautions and its relationship with hypersomnolence.  Discussed operating dangerous equipment and its relationship with hypersomnolence.  Discussed sleep hygiene, and benefits of a fixed sleep waked time.  The importance of getting eight or more hours of sleep discussed with patient.  Discussed limiting the use of the computer and television before bedtime.  Decrease naps during the day, so night time sleep will become enhanced.  Limit caffeine, and sleep deprivation.  HTN, stroke, and heart failure are potential risk factors.

## 2014-08-25 NOTE — Assessment & Plan Note (Signed)
see plan as stated for dyspnea on exertion

## 2014-08-25 NOTE — Progress Notes (Signed)
Date: 08/25/2014  MRN# [id number] Ernest [name] [birth year]  Referring Physician:   Norval [name] is a 63 y.o. old male seen in consultation for worsening shortness of breath with exertion  CC:  Chief Complaint  Patient presents with  . Advice Only    Referred for SOB.    HPI:  Patient is a pleasant 63 year old male with a past medical history as stated below primarily known for atrial fibrillation acid reflux disease, sleep apnea, and diabetes. He is seen in consultation for worsening shortness of breath with exertion over the past 2-3 months. Of note patient does have a significant history of coronary artery disease with a recent cardiac cath that showed mild diffuse stenosis (see summary below). Patient states about 2-3 months ago he started noticing worsening shortness of breath, can walk about 20-30 yards before having take a break, the shortness of breath is not associated with any cough or sputum production mostly associated with exertion. He has noted intermittent leg swelling for which his Lasix dose has been increased by his cardiologist. He does admit any weight gain, fever, night sweats. He states that in January of 2015, he had a upper respiratory tract infection and was laced on antibiotics for 7 days at that time. He cannot isolate any sole event that may have been an inciting factor to his shortness of breath. Patient states that he smoked for about 28 years at the highest 4 packs per day, however he has quit for the last 21 years. He admits to having 2 cats at home. 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 He states that he was diagnosed with obstructive sleep apnea in early 2000, he wears a CPAP intermittently , had a sleep titration study done about 2 years ago but cannot recall  his AHI or settings for his CPAP machine.   Past Medical History  Diagnosis Date  . Hypertension   . Hyperlipidemia   . Atrial fibrillation     a. previously on pradaxa-->switched to eliquis.  Ernest [name] GERD (gastroesophageal reflux disease)   . Sleep apnea     a. does not use CPAP.  . Diabetes mellitus     Type II  . Asthma   . Carotid artery occlusion   . Coronary artery disease     a. 05/2010 Cath: LM 30, LAD 50 diff, D1 30 ost, LCX 23m, RCA 62m. . Cardiac catheterization in August 2015 showed 30% ostial left main stenosis and 30% proximal and mid RCA stenosis. LVEDP was 14.   Surgical Hx:  Past Surgical History  Procedure Laterality Date  . Wrist surgery    . Tumor excision      Neck and finger; benign  . Cardiac catheterization  05/2010    [facility]  . Cardiac catheterization      [facility]  . Cardiac catheterization      [facility]  . Ankle surgery    . Cardiac catheterization  07/2014    [facility]   Family Hx:  Family History  Problem Relation Age of Onset  . Diabetes Other   . Depression Other   . Coronary artery disease Other   . Alcohol abuse Other   . Hypertension Other   . Hyperlipidemia Other   . Hypertension Mother   . Hyperlipidemia Mother   .  Diabetes Mother   . Heart disease Mother     CABG   Social Hx:   History  Substance Use Topics  . Smoking status: Former Smoker -- 4.00 packs/day for 28 years    Types: Cigarettes    Quit date: 08/25/1993  . Smokeless tobacco: Never Used  . Alcohol Use: No   Medication:   Current Outpatient Rx  Name  Route  Sig  Dispense  Refill  . amLODipine (NORVASC) 5 MG tablet   Oral   Take 1 tablet (5 mg total) by mouth daily.   30 tablet   6   . apixaban (ELIQUIS) 5 MG TABS tablet   Oral   Take 5 mg by mouth 2 (two) times daily.         Ernest [name] atorvastatin (LIPITOR) 40 MG tablet   Oral   Take 40 mg by mouth daily.         . furosemide (LASIX) 40 MG tablet   Oral   Take 20 mg by mouth 2 (two) times daily.          Ernest [name] gabapentin  (NEURONTIN) 600 MG tablet   Oral   Take 1,200 mg by mouth at bedtime.          Ernest [name] glipiZIDE (GLUCOTROL) 5 MG tablet   Oral   Take 5 mg by mouth daily before breakfast.         . isosorbide mononitrate (IMDUR) 60 MG 24 hr tablet   Oral   Take 60 mg by mouth daily.         . metFORMIN (GLUCOPHAGE) 500 MG tablet   Oral   Take 1,000 mg by mouth 2 (two) times daily.          . metoprolol (LOPRESSOR) 50 MG tablet   Oral   Take 1 tablet (50 mg total) by mouth 2 (two) times daily.   60 tablet   6   . omeprazole (PRILOSEC) 20 MG capsule   Oral   Take 20 mg by mouth 2 (two) times daily before a meal.          . ramipril (ALTACE) 5 MG capsule   Oral   Take 5 mg by mouth daily.           . saxagliptin HCl (ONGLYZA) 5 MG TABS tablet   Oral   Take 5 mg by mouth daily.             Allergies:  Review of patient's allergies indicates no known allergies.  Review of Systems: Gen:  Denies  fever, sweats, chills HEENT: Denies blurred vision, double vision, ear pain, eye pain, hearing loss, nose bleeds, sore throat Cvc:  No dizziness, chest pain or heaviness Resp:   Denies cough or sputum porduction, shortness of breath Gi: Denies swallowing difficulty, stomach pain, nausea or vomiting, diarrhea, constipation, bowel incontinence Gu:  Denies bladder incontinence, burning urine Ext:   No Joint pain, stiffness or swelling Skin: No skin rash, easy bruising or bleeding or hives Endoc:  No polyuria, polydipsia , polyphagia or weight change Psych: No depression, insomnia or hallucinations  Other:  All other systems negative  Physical Examination:   VS: BP 128/70  Pulse 83  Ht 5\' 8"  (1.727 m)  Wt 258 lb (117.028 kg)  BMI 39.24 kg/m2  SpO2 100%  General Appearance: No distress  Neuro:without focal findings, mental status, speech normal, alert and oriented, cranial nerves 2-12 intact, reflexes normal and symmetric, sensation grossly normal  HEENT:  PERRLA, EOM intact, no  ptosis, no other lesions noticed; Mallampati 3 Pulmonary: normal breath sounds., diaphragmatic excursion normal.No wheezing, No rales;   Sputum Production:   CardiovascularNormal S1,S2.  No m/r/g.  Abdominal aorta pulsation normal.    Abdomen: Benign, Soft, non-tender, No masses, hepatosplenomegaly, No lymphadenopathy, obese abdomen Renal:  No costovertebral tenderness  GU:  No performed at this time. Endoc: No evident thyromegaly, no signs of acromegaly or Cushing features Skin:   warm, no rashes, no ecchymosis  Extremities: normal, no cyanosis, clubbing, no edema, warm with normal capillary refill.    Labs results:   Rad results:    EKG:     Other:   Assessment and Plan: DOE (dyspnea on exertion) Multifactorial: Obesity, deconditioning, history of heart disease, OSA, environmental factors  Plan: -check PFTs -Will also check 6 minute walk test - Patient counseled on diet and exercise, he states that he has bad knees and probably cannot walk 30 minutes 3 times a week. Advised that he can do exercises in the swimming pool, then this will take pressure off is joints; he said he would look into it. -Review of his previous chest x-rays over the last 4-5 months does not show any interstitial process, hyperexpansion, alveolar or bronchial process. 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 However, his symptoms are semiacute and current chest x-rays does not show a chronic process. This can be further evaluated in the future with possible CT scan of the chest. 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 -Review of his echo showed he does have left atrial dilation a small left ventricular cavity and mild right atrial dilation; this could also be adding to dyspnea. He is currently being followed by cardiology, and his  heart regiment is optimize.  SHORTNESS OF BREATH see plan as stated for dyspnea on exertion  OSA on CPAP Patient states that he wears a CPAP on a nightly basis. From the history it seems as though he may have missed a CPAP titration study this past May, will obtain records from his last sleep study and adjust as necessary.   Encouraged proper weight management.  Excessive weight may contribute to snoring.  Monitor sedative use.  Discussed driving precautions and its relationship with hypersomnolence.  Discussed operating dangerous equipment and its relationship with hypersomnolence.  Discussed sleep hygiene, and benefits of a fixed sleep waked time.  The importance of getting eight or more hours of sleep discussed with patient.  Discussed limiting the use of the computer and television before bedtime.  Decrease naps during the day, so night time sleep will become enhanced.  Limit caffeine, and sleep deprivation.  HTN, stroke, and heart failure are potential risk factors.    Cough Mild cough Multifactorial Plan as stated in dyspnea  SOB (shortness of breath) Multifactorial Plan as stated with dyspnea on exertion  Obesity, unspecified OBESITY  Wt: 258 lbs BMI: 39 Discussed importance of weight reduction.  Educated regarding limitation of  intake of greasy/fried foods.  Instructed on benefit of  a low-impact exercise program, starting slowly.  Discussed benefits of 30-45 minutes of some form of exercise daily as well as benefit of supervised exercise program.       Updated Medication List Outpatient Encounter Prescriptions as of 08/25/2014  Medication Sig  . amLODipine (NORVASC)  5 MG tablet Take 1 tablet (5 mg total) by mouth daily.  Ernest [name] apixaban (ELIQUIS) 5 MG TABS tablet Take 5 mg by mouth 2 (two) times daily.  Ernest [name] atorvastatin (LIPITOR) 40 MG tablet Take 40 mg by mouth daily.  . furosemide (LASIX) 40 MG tablet Take 20 mg by mouth 2 (two) times daily.   Ernest [name] gabapentin  (NEURONTIN) 600 MG tablet Take 1,200 mg by mouth at bedtime.   Ernest [name] glipiZIDE (GLUCOTROL) 5 MG tablet Take 5 mg by mouth daily before breakfast.  . isosorbide mononitrate (IMDUR) 60 MG 24 hr tablet Take 60 mg by mouth daily.  . metFORMIN (GLUCOPHAGE) 500 MG tablet Take 1,000 mg by mouth 2 (two) times daily.   . metoprolol (LOPRESSOR) 50 MG tablet Take 1 tablet (50 mg total) by mouth 2 (two) times daily.  Ernest [name] omeprazole (PRILOSEC) 20 MG capsule Take 20 mg by mouth 2 (two) times daily before a meal.   . ramipril (ALTACE) 5 MG capsule Take 5 mg by mouth daily.    . saxagliptin HCl (ONGLYZA) 5 MG TABS tablet Take 5 mg by mouth daily.    Orders for this visit: Orders Placed This Encounter  Procedures  . Pulmonary function test    Standing Status: Future     Number of Occurrences:      Standing Expiration Date: 08/26/2015    Scheduling Instructions:     Please schedule at [facility].  Thank you.    Order Specific Question:  Where should this test be performed?    Answer:  Other    Order Specific Question:  Full PFT: includes the following: basic spirometry, spirometry pre & post bronchodilator, diffusion capacity (DLCO), lung volumes    Answer:  Full PFT    Order Specific Question:  MIP/MEP    Answer:  No    Order Specific Question:  6 minute walk    Answer:  Yes    Order Specific Question:  ABG    Answer:  No    Order Specific Question:  Diffusion capacity (DLCO)    Answer:  No    Order Specific Question:  Lung volumes    Answer:  No    Order Specific Question:  Methacholine challenge    Answer:  No     Thank  you for the consultation and for allowing [facility] to assist in the care of your patient. Our recommendations are noted above.  Please contact us if we can be of further service.   Vilinda Boehringer, MD [facility] Office Number: [phone]

## 2014-08-25 NOTE — Patient Instructions (Signed)
We will schedule you for a pulmonary function test and 6 minute walk at [facility]. We will request your sleep study records.  We will follow up in 1 month to review findings.

## 2014-08-25 NOTE — Assessment & Plan Note (Signed)
OBESITY  Wt: 258 lbs BMI: 39 Discussed importance of weight reduction.  Educated regarding limitation of  intake of greasy/fried foods.  Instructed on benefit of  a low-impact exercise program, starting slowly.  Discussed benefits of 30-45 minutes of some form of exercise daily as well as benefit of supervised exercise program.

## 2014-08-25 NOTE — Assessment & Plan Note (Signed)
Multifactorial: Obesity, deconditioning, history of heart disease, OSA, environmental factors  Plan: -check PFTs -Will also check 6 minute walk test - Patient counseled on diet and exercise, he states that he has bad knees and probably cannot walk 30 minutes 3 times a week. Advised that he can do exercises in the swimming pool, then this will take pressure off is joints; he said he would look into it. -Review of his previous chest x-rays over the last 4-5 months does not show any interstitial process, hyperexpansion, alveolar or bronchial process. 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 However, his symptoms are semiacute and current chest x-rays does not show a chronic process. This can be further evaluated in the future with possible CT scan of the chest. 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 -Review of his echo showed he does have left atrial dilation a small left ventricular cavity and mild right atrial dilation; this could also be adding to dyspnea. He is currently being followed by cardiology, and his heart regiment is optimize.

## 2014-08-25 NOTE — Assessment & Plan Note (Signed)
Mild cough Multifactorial Plan as stated in dyspnea

## 2014-08-28 ENCOUNTER — Ambulatory Visit: Payer: BC Managed Care – PPO | Admitting: Cardiovascular Disease

## 2014-08-31 ENCOUNTER — Ambulatory Visit: Payer: Self-pay | Admitting: Internal Medicine

## 2014-09-23 ENCOUNTER — Encounter: Payer: BC Managed Care – PPO | Admitting: Internal Medicine

## 2014-09-23 NOTE — Progress Notes (Signed)
Erroneous encounter. Disregard.

## 2014-09-24 ENCOUNTER — Ambulatory Visit: Payer: BC Managed Care – PPO | Admitting: Internal Medicine

## 2014-11-16 ENCOUNTER — Ambulatory Visit: Payer: BC Managed Care – PPO | Admitting: Cardiovascular Disease

## 2014-11-27 ENCOUNTER — Encounter: Payer: Self-pay | Admitting: Cardiovascular Disease

## 2014-11-27 ENCOUNTER — Ambulatory Visit (INDEPENDENT_AMBULATORY_CARE_PROVIDER_SITE_OTHER): Payer: BC Managed Care – PPO | Admitting: Cardiovascular Disease

## 2014-11-27 VITALS — BP 120/84 | HR 76 | Ht 68.0 in | Wt 255.0 lb

## 2014-11-27 DIAGNOSIS — E785 Hyperlipidemia, unspecified: Secondary | ICD-10-CM

## 2014-11-27 DIAGNOSIS — I4891 Unspecified atrial fibrillation: Secondary | ICD-10-CM

## 2014-11-27 DIAGNOSIS — I10 Essential (primary) hypertension: Secondary | ICD-10-CM

## 2014-11-27 NOTE — Patient Instructions (Signed)
Continue same medications.   Your physician wants you to follow-up in: 6 months.  You will receive a reminder letter in the mail two months in advance. If you don't receive a letter, please call our office to schedule the follow-up appointment.  

## 2014-11-27 NOTE — Progress Notes (Signed)
Primary care physician: Dr. Jeananne Rama  HPI  Ernest [name] is a pleasant 63year-old gentleman who works at [employer] . He is here today for a followup visit regarding shortness of breath and chronic atrial fibrillation. He has known history of chronic atrial fibrillation, diabetes, hypertension, OSA who does not use CPAP, hyperlipidemia, and coronary artery disease. He had a cardiac catheterization multiple times in the past.  Most recent cardiac catheterization in August of this year showed mild left main and RCA disease with no evidence of obstructive disease . Left ventricular end-diastolic pressure was slightly elevated at 14. Echocardiogram showed normal LV systolic function with mild tricuspid regurgitation and no evidence of pulmonary hypertension. Digoxin was added before hospital discharge for better rate control. Subsequently, I stopped the digoxin and increased metoprolol. He was referred to pulmonary for evaluation of dyspnea but did not go to his follow-up appointment. Overall, he is feeling better and denies any chest pain. He reports improvement in dyspnea.  No Known Allergies   Current Outpatient Prescriptions on File Prior to Visit  Medication Sig Dispense Refill  . amLODipine (NORVASC) 5 MG tablet Take 1 tablet (5 mg total) by mouth daily. 30 tablet 6  . apixaban (ELIQUIS) 5 MG TABS tablet Take 5 mg by mouth 2 (two) times daily.    Marland Kitchen atorvastatin (LIPITOR) 40 MG tablet Take 40 mg by mouth daily.    . furosemide (LASIX) 40 MG tablet Take 20 mg by mouth 2 (two) times daily.     Marland Kitchen gabapentin (NEURONTIN) 600 MG tablet Take 1,200 mg by mouth at bedtime.     Marland Kitchen glipiZIDE (GLUCOTROL) 5 MG tablet Take 5 mg by mouth daily before breakfast.    . isosorbide mononitrate (IMDUR) 60 MG 24 hr tablet Take 60 mg by mouth daily.    . metFORMIN (GLUCOPHAGE) 500 MG tablet Take 1,000 mg by mouth 2 (two) times daily.     . metoprolol (LOPRESSOR) 50 MG tablet Take 1 tablet (50 mg total) by mouth  2 (two) times daily. 60 tablet 6  . omeprazole (PRILOSEC) 20 MG capsule Take 20 mg by mouth 2 (two) times daily before a meal.     . ramipril (ALTACE) 5 MG capsule Take 5 mg by mouth daily.      . saxagliptin HCl (ONGLYZA) 5 MG TABS tablet Take 5 mg by mouth daily.     No current facility-administered medications on file prior to visit.     Past Medical History  Diagnosis Date  . Hypertension   . Hyperlipidemia   . Atrial fibrillation     a. previously on pradaxa-->switched to eliquis.  Marland Kitchen GERD (gastroesophageal reflux disease)   . Sleep apnea     a. does not use CPAP.  . Diabetes mellitus     Type II  . Asthma   . Carotid artery occlusion   . Coronary artery disease     a. 05/2010 Cath: LM 30, LAD 50 diff, D1 30 ost, LCX 28m, RCA 43m. . Cardiac catheterization in August 2015 showed 30% ostial left main stenosis and 30% proximal and mid RCA stenosis. LVEDP was 14.     Past Surgical History  Procedure Laterality Date  . Wrist surgery    . Tumor excision      Neck and finger; benign  . Cardiac catheterization  05/2010    [facility]  . Cardiac catheterization      [facility]  . Cardiac catheterization      [facility]  . Ankle  surgery    . Cardiac catheterization  07/2014    [facility]     Family History  Problem Relation Age of Onset  . Diabetes Other   . Depression Other   . Coronary artery disease Other   . Alcohol abuse Other   . Hypertension Other   . Hyperlipidemia Other   . Hypertension Mother   . Hyperlipidemia Mother   . Diabetes Mother   . Heart disease Mother     CABG     History   Social History  . Marital Status: Widowed    Spouse Name: N/A    Number of Children: N/A  . Years of Education: N/A   Occupational History  . Full time    Social History Main Topics  . Smoking status: Former Smoker -- 4.00 packs/day for 28 years    Types: Cigarettes    Quit date: 08/25/1993  . Smokeless tobacco: Never Used  . Alcohol Use: No  . Drug Use: No  . Sexual Activity: Not on  file   Other Topics Concern  . Not on file   Social History Narrative   No regular exercise.     ROS A 10 point review of system was performed. It is negative other than that mentioned in the history of present illness.   PHYSICAL EXAM   BP 120/84 mmHg  Pulse 76  Ht 5\' 8"  (1.727 m)  Wt 255 lb (115.667 kg)  BMI 38.78 kg/m2 Constitutional: He is oriented to person, place, and time. He appears well-developed and well-nourished. No distress.  HENT: No nasal discharge.  Head: Normocephalic and atraumatic.  Eyes: Pupils are equal and round.  No discharge. Neck: Normal range of motion. Neck supple. No JVD present. No thyromegaly present.  Cardiovascular: Normal rate, regular rhythm, normal heart sounds. Exam reveals no gallop and no friction rub. No murmur heard.  Pulmonary/Chest: Effort normal and breath sounds normal. No stridor. No respiratory distress. He has no wheezes. He has no rales. He exhibits no tenderness.  Abdominal: Soft. Bowel sounds are normal. He exhibits no distension. There is no tenderness. There is no rebound and no guarding.  Musculoskeletal: Normal range of motion. He exhibits no edema and no tenderness.  Neurological: He is alert and oriented to person, place, and time. Coordination normal.  Skin: Skin is warm and dry. No rash noted. He is not diaphoretic. No erythema. No pallor.  Psychiatric: He has a normal mood and affect. His behavior is normal. Judgment and thought content normal.      EKG: Atrial fibrillation  -irregular conduction  -  Nonspecific T-abnormality.   ABNORMAL      ASSESSMENT AND PLAN

## 2014-11-29 NOTE — Assessment & Plan Note (Signed)
Blood pressure is well controlled on current medications. 

## 2014-11-29 NOTE — Assessment & Plan Note (Addendum)
He is doing well from a cardiac standpoint with controlled ventricular rate. He is tolerating anticoagulation with no reported side effects. Exertional dyspnea has improved.

## 2014-11-29 NOTE — Assessment & Plan Note (Signed)
Continue treatment with atorvastatin with a target LDL of less than 100. 

## 2015-01-05 NOTE — Telephone Encounter (Signed)
This encounter was created in error - please disregard.

## 2015-04-10 NOTE — Consult Note (Signed)
Chief Complaint:  Subjective/Chief Complaint No CP or SOB   VITAL SIGNS/ANCILLARY NOTES: **Vital Signs.:   09-Aug-15 13:13  Temperature Temperature (F) 97.4  Celsius 36.3  Temperature Source oral  Pulse Pulse 82  Respirations Respirations 18  Systolic BP Systolic BP 090  Diastolic BP (mmHg) Diastolic BP (mmHg) 70  Mean BP 82  Pulse Ox % Pulse Ox % 96  Pulse Ox Activity Level  At rest  Oxygen Delivery Room Air/ 21 %   Physical Exam:  GEN no acute distress   RESP clear BS   CARD irregular rate  No LE edema   ABD denies tenderness   Lab Results: Thyroid:  08-Aug-15 04:25   Thyroid Stimulating Hormone 2.53 (0.45-4.50 (International Unit)  ----------------------- Pregnant patients have  different reference  ranges for TSH:  - - - - - - - - - -  Pregnant, first trimetser:  0.36 - 2.50 uIU/mL)  LabObservation:  08-Aug-15 07:12   OBSERVATION Reason for Test  Cardiology:  08-Aug-15 07:12   Echo Doppler REASON FOR EXAM:     COMMENTS:     PROCEDURE: [facility] - ECHO DOPPLER COMPLETE(TRANSTHOR)  - Jul 25 2014  7:12AM   RESULT: Echocardiogram Report  Patient Name:   Ernest [name] Date of Exam: 07/25/2014 Medical Rec #:  [id number]              Custom1: Date of Birth:  05/05/1951           Height:       68.0 in Patient Age:    64 years            Weight:       260.0 lb Patient Gender: M                   BSA:          2.29 m??  Indications: Atrial Fib Sonographer:    Arville Go RDCS Referring Phys: Myrtis Ser, P  Sonographer Comments: Technically difficult study due to poor echo  windows.  Summary:  1. Left ventricular ejection fraction, by visual estimation, is 55 to  60%.  2. Moderate to severely increased left ventricular internal cavity size.  3. Moderately dilated left atrium.  4. Mildly dilated right atrium.  5. Mild aortic valve sclerosis without stenosis.  6. Mildly increased left ventricular posterior wall thickness.  7. Mild tricuspid  regurgitation. 2D AND M-MODE MEASUREMENTS (normal ranges within parentheses): Left Ventricle:          Normal IVSd (2D):      1.10 cm (0.7-1.1) LVPWd (2D):     1.13 cm (0.7-1.1) Aorta/LA:                  Normal LVIDd (2D):     5.91 cm (3.4-5.7) Aortic Root (2D): 3.10 cm (2.4-3.7) LVIDs (2D):     4.17 cm           Left Atrium (2D): 4.80 cm (1.9-4.0) LV FS (2D):     29.4 %   (>25%) LV EF (2D):     55.6 %   (>50%)                                   Right Ventricle:  RVd (2D): LV DIASTOLIC FUNCTION: MV Peak E: 1.18 m/s SPECTRAL DOPPLER ANALYSIS (where applicable): Aortic Valve: AoV Max Vel: 1.49 m/s AoV Peak PG: 8.9 mmHg AoV Mean PG: LVOT Vmax: 0.73 m/s LVOT VTI:  LVOT Diameter: 1.90 cm AoV Area, Vmax: 1.38 cm?? AoVArea, VTI:  AoV Area, Vmn: Tricuspid Valve and PA/RV Systolic Pressure: TR Max Velocity: 2.13 m/s RA  Pressure: 5 mmHg RVSP/PASP: 23.1 mmHg Pulmonic Valve: PV Max Velocity: 1.00 m/s PV Max PG: 4.0 mmHg PV Mean PG:  PHYSICIAN INTERPRETATION: Left Ventricle: The left ventricular internal cavity size was moderate to  severely increased. LV septal wall thickness was normal. LV posterior  wall thickness was mildly increased. Left ventricular ejection fraction,  by visual estimation, is 55 to60%. Left Atrium: The left atrium is moderately dilated. Right Atrium: The right atrium is mildly dilated. Pericardium: There is no evidence of pericardial effusion. Mitral Valve: The mitral valve is normal in structure. Trace mitral valve  regurgitation is seen. Tricuspid Valve: The tricuspid valve is normal. Mild tricuspid  regurgitation is visualized. The tricuspid regurgitant velocity is 2.13  m/s, and with an assumed right atrial pressure of 5 mmHg, the estimated  right ventricular systolic pressure is normal at 23.1 mmHg. Aortic Valve: Mild aortic valve sclerosis is present, with no evidence of  aortic valve stenosis. No evidence of aortic valve  regurgitation is seen. Pulmonic Valve: The pulmonic valve is not well seen. No indication of  pulmonic valve regurgitation. Venous: The inferior vena cava was normal.  [id number] Dorris Carnes MD Electronically signed by [id number] Dorris Carnes MD Signature Date/Time: 07/25/2014/2:01:35 PM  *** Final *** IMPRESSION: .    Verified PH:XTAVW Alcus Dad, M.D., MD  Routine Chem:  08-Aug-15 04:25   Magnesium, Serum  1.6 (1.8-2.4 THERAPEUTIC RANGE: 4-7 mg/dL TOXIC: > 10 mg/dL  -----------------------)  Glucose, Serum  125  BUN 16  Creatinine (comp) 1.21  Sodium, Serum 139  Potassium, Serum 3.7  Chloride, Serum 105  CO2, Serum 29  Calcium (Total), Serum 8.9  Anion Gap  5  Osmolality (calc) 280  eGFR (African American) >60  eGFR (Non-African American) >60 (eGFR values <61m/min/1.73 m2 may be an indication of chronic kidney disease (CKD). Calculated eGFR is useful in patients with stable renal function. The eGFR calculation will not be reliable in acutely ill patients when serum creatinine is changing rapidly. It is not useful in  patients on dialysis. The eGFR calculation may not be applicable to patients at the low and high extremes of body sizes, pregnant women, and vegetarians.)  Hemoglobin A1c ([facility])  7.5 (The American Diabetes Association recommends that a primary goal of therapy should be <7% and that physicians should reevaluate the treatment regimen in patients with HbA1c values consistently >8%.)  Cholesterol, Serum 104  Triglycerides, Serum  243  HDL (INHOUSE)  25  VLDL Cholesterol Calculated  49  LDL Cholesterol Calculated 30 (Result(s) reported on 25 Jul 2014 at 05:01AM.)  Routine Hem:  08-Aug-15 04:25   WBC (CBC) 9.2  RBC (CBC) 5.02  Hemoglobin (CBC) 13.6  Hematocrit (CBC) 40.7  Platelet Count (CBC) 165  MCV 81  MCH 27.1  MCHC 33.4  RDW  15.4  Neutrophil % 39.8  Lymphocyte % 52.7  Monocyte % 4.4  Eosinophil % 2.5  Basophil % 0.6  Neutrophil # 3.6  Lymphocyte #  4.8   Monocyte # 0.4  Eosinophil # 0.2  Basophil # 0.1 (Result(s) reported on 25 Jul 2014 at 04:51AM.)   Assessment/Plan:  Assessment/Plan:  Assessment 1.  Unstable angina.  Remains rel symptom free.  Plan for L heart cath to define anatomy  2.  DM  Per IM  3.  HTN  Follow  4.  Renal  WIll need to follow post cath.  5.  Afib  Rates controlled  On heparin.   Electronic Signatures: Dorris Carnes (MD)  (Signed 09-Aug-15 13:28)  Authored: Chief Complaint, VITAL SIGNS/ANCILLARY NOTES, Physical Exam, Lab Results, Assessment/Plan   Last Updated: 09-Aug-15 13:28 by Dorris Carnes (MD)

## 2015-04-10 NOTE — Discharge Summary (Signed)
PATIENT NAME:  Ernest [name], Ernest [name] MR#:  [id number] DATE OF BIRTH:  [birth year]  DATE OF ADMISSION:  07/24/2014 DATE OF DISCHARGE:  07/27/2014  PRESENTING COMPLAINT: Chest pain.   DISCHARGE DIAGNOSES: 1.  Unstable angina, status post cardiac catheterization with stable coronary artery disease.  2.  History of hypertension.  3.  Type 2 diabetes.  4.  Morbid obesity.   PROCEDURES: Cardiac catheterization showed no angiographic evidence for occlusive coronary artery disease, mild left main and RCA disease, which is unchanged from 2011. EF by echocardiogram is 60%.   CODE STATUS: Full code.  CONSULTATION:  Cardiology consultation with [facility] A. Fletcher Anon, MD  MEDICATIONS: 1.  Lipitor 40 mg at bedtime.  2.  Amlodipine 10 mg daily.  3.  Neurontin 600 mg 2 tablets daily at bedtime.  4.  Imdur 60 mg extended release p.o. daily.  5.  Prilosec 20 mg at bedtime.  6.  Ferrous sulfate 325 p.o. b.i.d.  7.  Ramipril 5 mg at bedtime.  8.  Onglyza 5 mg at bedtime.  9.  Glipizide 5 mg at bedtime.  10.  Lasix 20 mg p.o. b.i.d.  11.  Metoprolol 25 mg b.i.d.  12.  Metformin 500 mg 2 tablets 2 times a day from August 12th.  13.  Eliquis 5 mg 1 tablet b.i.d., begin from 07/28/2014.  14.  Digoxin 250 mcg p.o. daily.   FOLLOWUP:   1.  With Dr. Fletcher Anon in 1-2 weeks.   2.  Follow up with Dr. Jeananne Rama in 2 weeks.   BRIEF SUMMARY OF HOSPITAL COURSE: Mr. Nin is a 64 year old Caucasian gentleman with history of hypertension, CAD, and diabetes, comes in with:  1.  Unstable angina. The patient from was admitted on medical telemetry floor, started on IV heparin drip. Continued his cardiac meds including beta blockers, nitrates, and statins. He underwent cardiac catheterization which was done by Dr. Fletcher Anon.  It showed chronic CAD, no acute intervention needed. The patient was discharged to home and was asked to resume his Eliquis on 07/28/2014. Follow up with Dr. Fletcher Anon as outpatient.  2.  Chronic atrial  fibrillation, rate controlled. Continue beta blockers and digoxin and Eliquis was resumed at discharge.  3.  Hypertension, remained stable.  4.  Hyperlipidemia. Continue statins.  5. Type 2 diabetes. The patient was resumed back on his insulin along with metformin, which will be started 48 hours after discharge.  6.  History of obstructive sleep apnea; however, patient is not compliant with CPAP. He will follow up with primary care physician and cardiology as instructed.   TIME SPENT: Forty minutes.    ____________________________ Hart Rochester Posey Pronto, MD sap:LT D: 07/28/2014 14:50:14 ET T: 07/28/2014 16:56:53 ET JOB#: [id number]  cc: Clois Treanor A. Posey Pronto, MD, <Dictator> Ilda Basset MD ELECTRONICALLY SIGNED 07/29/2014 13:51

## 2015-04-10 NOTE — Consult Note (Signed)
General Aspect 64 y/o male with a h/o nonobstructive CAD with recent increase in chest pain who presented to the ED today 2/2 worsening angina. ________________  Past Medical History  1.  Nonobstructive CAD      a. ~ 2003 Cath @ [facility] - nonobs dzs.      b. 05/2010 Cath @ [facility]: LM 30, LAD 50 diff, D1 30 ost, LCX 78m RCA 547m>Med Rx. 2.  DM 3.  HTN 4.  HL 5.  Carotid Occlusion 6.  OSA - not using CPAP 7.  Permanent Afib      a. CHA2DS2VASc = 3-->On eliquis. 8.  Iron Deficiency Anemia      a. 02/2014 Colonoscopy - poor prep, no bleeding, EGD - normal. __________________   Present Illness [age]/o male with the above complex problem list.  He was recently seen in clinic by Dr. ArFletcher Anonn 8/4 secondary to progressive dyspnea and chest pain occurring on an almost daily basis over the past few mos.  Ss have been requiring less and less provocation prior to occurring.  Decision was made to pursue a lexiscan myoview with a low threshold to pursue cath.  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  He was then taken to ED by co-worker for evaluation.  Here, he was treated with NTG patch.  He says c/p is just now easing off, after 3 hrs of persistent Ss.  Initial troponin is nl.  ECG is non-acute.  C/p currently rated as 1-2/10.   Physical Exam:  GEN pleasant, nad.   HEENT pink conjunctivae, moist oral mucosa   NECK supple  no bruits, obese - difficult to assess jvp.   RESP normal resp effort  clear BS   CARD Regular rate and rhythm  Normal, S1, S2  No murmur   ABD denies tenderness  soft  normal BS   LYMPH negative neck   EXTR negative cyanosis/clubbing, negative edema    SKIN normal to palpation   NEURO cranial nerves intact, motor/sensory function intact   PSYCH alert, A+O to time, place, person, good insight   Review of Systems:  General: No Complaints   Skin: No Complaints   ENT: No Complaints   Eyes: No Complaints   Neck: No Complaints   Respiratory: DOE   Cardiovascular: Exertional c/p and dyspnea as above.  Also presyncope today.   Gastrointestinal: Has had nausea with chest pain in past - not today.   Genitourinary: Difficulty starting stream.   Vascular: No Complaints   Musculoskeletal: No Complaints   Neurologic: No Complaints   Hematologic: No Complaints   Endocrine: No Complaints   Psychiatric: No Complaints   Review of Systems: All other systems were reviewed and found to be negative   Medications/Allergies Reviewed Medications/Allergies reviewed   Family & Social History:  Family and Social History:  Family History +++ CAD on mother's side.  Mother had CABG as did two aunts. Father and brother died of cancer.  + FH of depression , DM, HTN, HL.   Social History Truck drGeophysicist/field seismologist Quit smoking in 1995.  No etoh/drugs.   Place of Living Home   Lab Results:  TDMs:  07-Aug-15 11:11   Digoxin, Serum  < 0.1 (Therapeutic range for digoxin in patients with atrial fibrillation: 0.8 - 2.0 ng/mL. In patients with congestive heart failure a therapeutic range of 0.5 - 0.8 ng/mL is suggested as higher levels are associated with an increased risk of toxicity without clear evidence of enhanced efficacy. Digoxin toxicity is commonly associated with serum levels > 2.0 ng/mL but may occur with lower levels, including those in the therapeutic range. Blood samples should be obtained 6-8 hours after administration to assure a reasonable volume of distribution.)  Routine Chem:  07-Aug-15 11:11   Glucose, Serum  151  BUN  20  Creatinine (comp)  1.39  Sodium, Serum 142  Potassium, Serum 3.7  Chloride, Serum 106  CO2, Serum 30   Calcium (Total), Serum 8.6  Anion Gap  6  Osmolality (calc) 289  eGFR (African American) >60  eGFR (Non-African American)  53 (eGFR values <27m/min/1.73 m2 may be an indication of chronic kidney disease (CKD). Calculated eGFR is useful in patients with stable renal function. The eGFR calculation will not be reliable in acutely ill patients when serum creatinine is changing rapidly. It is not useful in  patients on dialysis. The eGFR calculation may not be applicable to patients at the low and high extremes of body sizes, pregnant women, and vegetarians.)  B-Type Natriuretic Peptide ([facility])  604 (Result(s) reported on 24 Jul 2014 at 11:38AM.)  Cardiac:  07-Aug-15 11:11   Troponin I < 0.02 (0.00-0.05 0.05 ng/mL or less: NEGATIVE  Repeat testing in 3-6 hrs  if clinically indicated. >0.05 ng/mL: POTENTIAL  MYOCARDIAL INJURY. Repeat  testing in 3-6 hrs if  clinically indicated. NOTE: An increase or decrease  of 30% or more on serial  testing suggests a  clinically important change)  Routine Coag:  07-Aug-15 11:11   Prothrombin 13.4  INR 1.0 (INR reference interval applies to patients on anticoagulant therapy. A single INR therapeutic range for coumarins is not optimal for all indications; however, the suggested range for most indications is 2.0 - 3.0. Exceptions to the INR Reference Range may include: Prosthetic heart valves, acute myocardial infarction, prevention of myocardial infarction, and combinations of aspirin and anticoagulant. The need for a higher or lower target INR must be assessed individually. Reference: The Pharmacology and Management of the Vitamin K  antagonists: the seventh ACCP Conference on Antithrombotic and Thrombolytic Therapy. CVPXTG.6269Sept:126 (3suppl): [id number] A HCT value >55% may artifactually increase the PT.  In one study,  the increase was an average of 25%. Reference:  "Effect on Routine and Special Coagulation Testing Values of Citrate  Anticoagulant Adjustment in Patients with High HCT Values." American Journal of Clinical Pathology 2006;126:400-405.)  Routine Hem:  07-Aug-15 11:11   WBC (CBC) 8.5  RBC (CBC) 5.08  Hemoglobin (CBC) 13.5  Hematocrit (CBC) 41.5  Platelet Count (CBC) 172 (Result(s) reported on 24 Jul 2014 at 11:25AM.)  MCV 82  MCH 26.6  MCHC 32.6  RDW  14.8   EKG:  EKG Interp. by me   Interpretation afib, 100, no acute st/t changes.   Radiology Results: XRay:    07-Aug-15 11:22, Chest Portable Single View  Chest Portable Single View   REASON FOR EXAM:    chest tightness  COMMENTS:       PROCEDURE: DXR - DXR PORTABLE CHEST SINGLE VIEW  - Jul 24 2014 11:22AM     CLINICAL DATA:  Chest pain.    EXAM:  PORTABLE CHEST - 1 VIEW  COMPARISON:  01/02/2014    FINDINGS:  Cardiac silhouette remains mildly to moderately enlarged, unchanged.  There is improved aeration of the lung bases. There is no evidence  of new airspace opacity, pleural effusion, edema, or pneumothorax.  No acute osseous abnormality is seen.     IMPRESSION:  No activedisease.      Electronically Signed    By: Logan Bores    On: 07/24/2014 11:40         Verified By: Ferol Luz, M.D.,    Ibuprofen: Other   Impression 1.  [place]/CAD:  Pt presents with a several month h/o progressive exertional angina and dyspnea that worsened today and was associated with diaphoresis and presyncope.  He has a h/o nonobs CAD by his last cath in 05/2010.  Ongoing RF include HTN, HL, and morbid obesity.  Given duration and severity of Ss, would not be surprised if he rules in.  Recommend - admit, cycle CE (initial nl), cont bb, nitrate, statin.  Add ASA and heparin and hold eliquis.  Hold metformin.  Plan cath for Monday.  2.  Afib:  Permanent.  Rate-controlled.  Cont BB/Digoxin.  Hold eliquis pending cath.  Heparin as above.  3.  HTN:  Stable.  Cont BB/CCB.  4.  HL:  Check lipids/lft's.  Cont statin - increase dose to 60m daily in  setting of ACS.  5.  DM:  Hold metformin.  Add SSI.  6.  Morbid Obesity:  Would benefit from cardiac rehab.  7.  OSA:  Not using CPAP.  Will need outpt discussion and f/u.   Electronic Signatures for Addendum Section:  AKathlyn Sacramento(MD) (Signed Addendum 07-Aug-15 16:41)  The patient was seen and examined. Agree with the above. He presented with worsening exertional chest pain with a prolonged episode at rest.  Symptoms are suggestive of unstable angina. Facial droop was reported but I don;t appreciate that on exam. Also no other neuro symptoms. CT heard was negative.  Hold Eliquis. Continue Heparin.  Obtain echo.  Cardiac cath on Monday.   Electronic Signatures: AKathlyn Sacramento(MD)  (Signed 07-Aug-15 16:41)  Co-Signer: General Aspect/Present Illness, Family & Social History, Home Medications, Allergies BRogelia Mire(NP)  (Signed 07-Aug-15 13:54)  Authored: General Aspect/Present Illness, History and Physical Exam, Review of System, Family & Social History, Home Medications, Labs, EKG , Radiology, Allergies, Impression/Plan   Last Updated: 07-Aug-15 16:41 by AKathlyn Sacramento(MD)

## 2015-04-10 NOTE — H&P (Signed)
PATIENT NAME:  Ernest [name], Ernest [name] MR#:  [id number] DATE OF BIRTH:  03-09-1951  DATE OF ADMISSION:  07/24/2014  REFERRING PHYSICIAN: Dr. Wynetta Emery   CHIEF COMPLAINT: Chest pain.   HISTORY OF PRESENT ILLNESS: This is very pleasant 64 year old man with past medical history of coronary artery disease nonobstructive by cardiac catheterization in June 2011, atrial fibrillation on Eliquis, and diabetes presents with worsening anginal symptoms over the past few weeks with an acute episode of anginal type chest pain with exertion this morning. 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 He reports that at that time he was stumbling, unable to walk when his friend found him and insisted that he come to the Emergency Department. He reports that he has had similar symptoms over the past few days which have been accompanied by profound diaphoresis, nausea, no vomiting. Some of these episodes have occurred at rest.   PAST MEDICAL HISTORY:  1.  Coronary artery disease with last cardiac catheterization June 2011 at [facility] showing nonobstructive disease.  2.  Diabetes mellitus.  3.  Hypertension.  4.  Hyperlipidemia.  5.  Carotid occlusion.  6.  Obstructive sleep apnea, not using CPAP.  7.  Permanent atrial fibrillation, on Eliquis. 8.  Iron deficiency anemia with a colonoscopy in March of 2015 and EGD March 2015, both normal.  HOME MEDICATIONS:  1.  Ramipril 5 mg once a day.  2.  Prilosec 20 mg once a day.  3.  Onglyza 5 mg once a day.  4.  Neurontin 600 mg 3 tablets once a day at bedtime.  5.  Metoprolol tartrate 25 mg twice a day.  6.  Metformin 500 mg 2 tablets twice a day.  7.  Lipitor 40 mg once a day.  8.  Imdur 60 mg once a day.  9.  Glipizide 5 mg 1 tablet once a day.  10.  Furosemide 20 mg 1 tablet twice a day.  11.  Ferrous sulfate 325 mg 1 tablet twice a day.  12.  Amlodipine  10 mg 1 tablet once a day.  13.  Eliquis 5 mg 1 tablet twice a day.   ALLERGIES: IBUPROFEN.   SOCIAL HISTORY: The patient denies current smoking, quit in 1995. He denies alcohol or drug use. He does state that he very rarely drinks alcohol. He is a current truck Geophysicist/field seismologist. He lives at home with his daughter. He ambulates without any assistance.  FAMILY HISTORY: He reports that there are multiple members of his mother's side of the family with coronary artery disease. His mother had a coronary artery bypass surgery. He reports that his father and brother died of cancer, brother with colon cancer.   REVIEW OF SYSTEMS: CONSTITUTIONAL: Positive for diaphoresis. Negative for weakness, fevers, chills, weight change.  HEENT: Negative for change in vision, pain in the eyes, change in hearing, pain in the ears, oral pain, difficulty swallowing.  PULMONARY: Positive for episodes of shortness of breath. No cough, wheezing, orthopnea, hemoptysis.  CARDIOVASCULAR: Positive for chest pain, as described above. Negative for palpitations. GASTROENTEROLOGY: Positive for episodes of nausea. Negative for vomiting and diarrhea, no hematemesis, no hematochezia, no abdominal pain.  GENITOURINARY: Negative for frequency or dysuria.  MUSCULOSKELETAL: Positive for pain in the jaw during episodes of chest pain. Negative for any tender or swollen joints. Negative for any history of trauma. No sore muscles.  NEUROLOGIC:  Negative for seizure, headache, focal numbness or weakness.  PSYCHIATRIC: Negative for signs of anxiety or depression.   PHYSICAL EXAMINATION: VITAL SIGNS: Temperature 98.1, pulse 82, respirations 16, blood pressure 132/82, pulse 98.  GENERAL: The patient is alert, oriented, resting comfortably in the bed.  HEENT: Pupils are equal, round, and reactive. Extraocular motion is intact. Left ptosis is noted. Oral mucosa are pink and moist, poor dentition, no oral lesions, oropharynx is clear, trachea is midline.   PULMONARY: Lungs are clear to auscultation bilaterally with good air movement.  CARDIOVASCULAR: Irregularly irregular rhythm, no murmurs, rubs or gallops, no edema.  SKIN: No rash, normal to palpation. NEUROLOGIC: Cranial nerves are grossly intact. Motor and sensory function is intact.  PSYCHIATRIC: The patient is alert, normal affect, normal mentation, good insight.  DIAGNOSTIC DATA: Labs: Sodium 142, potassium 3.7, chloride 106, bicarb 30, BUN 20, creatinine 1.39, glucose 151. Troponin 0.02. BNP 604. Digoxin level is less than 0.1. White blood cells 8.5, hemoglobin 13.5, platelets 172,000, MCV 82. INR is 1.   Imaging: CT scan of the head shows no acute intracranial changes.  Chest x-ray shows no active cardiopulmonary disease.   ASSESSMENT AND PLAN: 1.  Unstable angina with known coronary artery disease: Cardiology is following. [place] cardiology consultation. We will continue to cycle cardiac enzymes. We will continue beta blocker, nitrates, statin. We will add aspirin and heparin and hold Eliquis at this time. We will hold metformin in preparation for cardiac catheterization, most likely on Monday.  2.  Atrial fibrillation: Rate controlled at this time. Continue with beta blocker and digoxin. Holding Eliquis pending catheterization. 3.  Hypertension: Blood pressure is stable at this time. Continue with home regimen.  4.  Hyperlipidemia: Check LFTs in the morning. Continue statin. Increase dose as recommended by cardiology to 80 mg daily in the setting of acute coronary syndrome.   5.  Diabetes: Hold metformin, add sliding scale insulin, carb modified diet.  6.  Obstructive sleep apnea: Currently not compliant with CPAP at home. We will discuss with his primary care physician.  TIME SPENT ON THIS ADMISSION: 45 minutes. ____________________________ Earleen Newport. Volanda Napoleon, MD cpw:sb D: 07/24/2014 15:25:38 ET T: 07/24/2014 16:19:02 ET JOB#: [id number]  cc: Barnetta Chapel P. Volanda Napoleon, MD,  <Dictator> Aldean Jewett MD ELECTRONICALLY SIGNED 08/01/2014 13:55

## 2015-04-10 NOTE — Consult Note (Signed)
Chief Complaint:  Subjective/Chief Complaint A little dizzy while bathing (sponge bath).  Tightness in chest earlier  None now.  No SOB   VITAL SIGNS/ANCILLARY NOTES: **Vital Signs.:   08-Aug-15 11:15  Vital Signs Type Routine  Temperature Temperature (F) 97.8  Celsius 36.5  Temperature Source oral  Pulse Pulse 78  Respirations Respirations 20  Systolic BP Systolic BP 382  Diastolic BP (mmHg) Diastolic BP (mmHg) 71  Mean BP 87  Pulse Ox % Pulse Ox % 92  Pulse Ox Activity Level  At rest  Oxygen Delivery 2L   Physical Exam:  GEN no acute distress, obese   NECK JVP is normal   RESP clear BS   CARD regular rate  no murmur   ABD Obese   EXTR negative edema   Lab Results: Thyroid:  08-Aug-15 04:25   Thyroid Stimulating Hormone 2.53 (0.45-4.50 (International Unit)  ----------------------- Pregnant patients have  different reference  ranges for TSH:  - - - - - - - - - -  Pregnant, first trimetser:  0.36 - 2.50 uIU/mL)  LabObservation:  08-Aug-15 07:41   OBSERVATION Reason for Test  Routine Chem:  08-Aug-15 04:25   Magnesium, Serum  1.6 (1.8-2.4 THERAPEUTIC RANGE: 4-7 mg/dL TOXIC: > 10 mg/dL  -----------------------)  Glucose, Serum  125  BUN 16  Creatinine (comp) 1.21  Sodium, Serum 139  Potassium, Serum 3.7  Chloride, Serum 105  CO2, Serum 29  Calcium (Total), Serum 8.9  Anion Gap  5  Osmolality (calc) 280  eGFR (African American) >60  eGFR (Non-African American) >60 (eGFR values <36m/min/1.73 m2 may be an indication of chronic kidney disease (CKD). Calculated eGFR is useful in patients with stable renal function. The eGFR calculation will not be reliable in acutely ill patients when serum creatinine is changing rapidly. It is not useful in  patients on dialysis. The eGFR calculation may not be applicable to patients at the low and high extremes of body sizes, pregnant women, and vegetarians.)  Hemoglobin A1c ([facility])  7.5 (The American Diabetes  Association recommends that a primary goal of therapy should be <7% and that physicians should reevaluate the treatment regimen in patients with HbA1c values consistently >8%.)  Cholesterol, Serum 104  Triglycerides, Serum  243  HDL (INHOUSE)  25  VLDL Cholesterol Calculated  49  LDL Cholesterol Calculated 30 (Result(s) reported on 25 Jul 2014 at 05:01AM.)  Cardiac:  07-Aug-15 20:03   Troponin I < 0.02 (0.00-0.05 0.05 ng/mL or less: NEGATIVE  Repeat testing in 3-6 hrs  if clinically indicated. >0.05 ng/mL: POTENTIAL  MYOCARDIAL INJURY. Repeat  testing in 3-6 hrs if  clinically indicated. NOTE: An increase or decrease  of 30% or more on serial  testing suggests a  clinically important change)  CK, Total 96 (39-308 NOTE: NEW REFERENCE RANGE  01/19/2014)  CPK-MB, Serum 1.2 (Result(s) reported on 24 Jul 2014 at 08:33PM.)  Routine Hem:  08-Aug-15 04:25   WBC (CBC) 9.2  RBC (CBC) 5.02  Hemoglobin (CBC) 13.6  Hematocrit (CBC) 40.7  Platelet Count (CBC) 165  MCV 81  MCH 27.1  MCHC 33.4  RDW  15.4  Neutrophil % 39.8  Lymphocyte % 52.7  Monocyte % 4.4  Eosinophil % 2.5  Basophil % 0.6  Neutrophil # 3.6  Lymphocyte #  4.8  Monocyte # 0.4  Eosinophil # 0.2  Basophil # 0.1 (Result(s) reported on 25 Jul 2014 at 04:51AM.)   Assessment/Plan:  Assessment/Plan:  Assessment 1.  Unstable angina  patient with  history of nonobstructive CAD (last cath 2011)  Now with symptoms concerning for unstable angina Plan for heparin, NTG  Cath on Monday 11 beat NSVT  Follow on tele.    2.  HTN  Follow  SOme dizziness with bathing  Will follow today  3.  CV disease  Continue meds   4.  OSA  Will need to reeval CPAP as outpt  5  Afib  Rates good  On heparin  Eliquis on hold   Electronic Signatures: Dorris Carnes (MD)  (Signed 08-Aug-15 13:11)  Authored: Chief Complaint, VITAL SIGNS/ANCILLARY NOTES, Physical Exam, Lab Results, Assessment/Plan   Last Updated: 08-Aug-15 13:11 by Dorris Carnes (MD)

## 2015-05-21 ENCOUNTER — Other Ambulatory Visit (INDEPENDENT_AMBULATORY_CARE_PROVIDER_SITE_OTHER): Payer: Medicare Other

## 2015-05-21 ENCOUNTER — Encounter (INDEPENDENT_AMBULATORY_CARE_PROVIDER_SITE_OTHER): Payer: Self-pay

## 2015-05-21 VITALS — BP 114/68 | HR 94

## 2015-05-21 DIAGNOSIS — I482 Chronic atrial fibrillation, unspecified: Secondary | ICD-10-CM

## 2015-05-21 LAB — COAGUCHEK XS/INR WAIVED
INR: 2 — ABNORMAL HIGH (ref 0.9–1.1)
Prothrombin Time: 23.7 s

## 2015-05-21 MED ORDER — WARFARIN SODIUM 5 MG PO TABS: 5.0000 mg | ORAL_TABLET | Freq: Every day | ORAL | Status: DC

## 2015-05-21 NOTE — Progress Notes (Unsigned)
Filed Vitals:   05/21/15 1117  BP: 114/68  Pulse: 94      CC: Coumadin management  Subjective:  This patient is a 64 year old male who presents for coumadin management. The expected duration of coumadin treatment long term   The reason for anticoagulation is. H6 mech heart valve  Present Coumadin dose:5 Goal: 2.5-3.5 Excessive bruising: no H8  Nose bleeding: noH8  Rectal bleeding: noH8   Eating diet with consistent amounts of foods containing Vitamin K:no Any recent antibiotic use? no  Objective: General:normal Skin:normal  Current INR:  INR: 2  Last CBC: HGB: 14.7 on 12/23/2014   HCT: 44.1 on 12/23/2014      Assessment:valve  Plan: Discussed current plan face-to-face with patient. For coumadin dosing, elected to cont and repeat in  2 weeks

## 2015-05-21 NOTE — Progress Notes (Unsigned)
Current results: INR 2.0 PT 23.7

## 2015-06-04 ENCOUNTER — Other Ambulatory Visit: Payer: Medicare Other

## 2015-07-06 ENCOUNTER — Ambulatory Visit (INDEPENDENT_AMBULATORY_CARE_PROVIDER_SITE_OTHER): Payer: Medicare Other | Admitting: Family Medicine

## 2015-07-06 ENCOUNTER — Encounter: Payer: Self-pay | Admitting: Family Medicine

## 2015-07-06 DIAGNOSIS — E785 Hyperlipidemia, unspecified: Secondary | ICD-10-CM | POA: Diagnosis not present

## 2015-07-06 DIAGNOSIS — I482 Chronic atrial fibrillation, unspecified: Secondary | ICD-10-CM

## 2015-07-06 DIAGNOSIS — E1159 Type 2 diabetes mellitus with other circulatory complications: Secondary | ICD-10-CM | POA: Insufficient documentation

## 2015-07-06 DIAGNOSIS — I10 Essential (primary) hypertension: Secondary | ICD-10-CM | POA: Diagnosis not present

## 2015-07-06 DIAGNOSIS — E1122 Type 2 diabetes mellitus with diabetic chronic kidney disease: Secondary | ICD-10-CM | POA: Insufficient documentation

## 2015-07-06 DIAGNOSIS — I152 Hypertension secondary to endocrine disorders: Secondary | ICD-10-CM | POA: Insufficient documentation

## 2015-07-06 DIAGNOSIS — I2510 Atherosclerotic heart disease of native coronary artery without angina pectoris: Secondary | ICD-10-CM

## 2015-07-06 DIAGNOSIS — I129 Hypertensive chronic kidney disease with stage 1 through stage 4 chronic kidney disease, or unspecified chronic kidney disease: Secondary | ICD-10-CM

## 2015-07-06 DIAGNOSIS — N183 Chronic kidney disease, stage 3 (moderate): Secondary | ICD-10-CM

## 2015-07-06 DIAGNOSIS — E119 Type 2 diabetes mellitus without complications: Secondary | ICD-10-CM

## 2015-07-06 MED ORDER — WARFARIN SODIUM 4 MG PO TABS: 4.0000 mg | ORAL_TABLET | Freq: Every day | ORAL | Status: DC

## 2015-07-06 NOTE — Assessment & Plan Note (Addendum)
The current medical regimen is effective;  continue present plan and medications. Continue monthly INR INR 4.1 today which is way too high Patient's diet has changed dramatically as his pocketbook is empty and eating awful thin. We will hold warfarin for 2 days then restarted at 4 mg until patient gets back on his regular diet then we'll restart 5 mg as patient has been well controlled on that.

## 2015-07-06 NOTE — Progress Notes (Signed)
BP 120/76 mmHg  Pulse 103  Temp(Src) 98.2 F (36.8 C)  Ht 5' 8.6" (1.742 m)  Wt 252 lb (114.306 kg)  BMI 37.67 kg/m2  SpO2 97%   Subjective:    Patient ID: Ernest [name], male    DOB: 04/23/1951, 64 y.o.   MRN: [id number]  HPI: Ernest [name] is a 64 y.o. male  Chief Complaint  Patient presents with  . Atrial Fibrillation  . Diabetes  . Hyperlipidemia  . Hypertension   Patient for recheck medical problems doing well but not taking Onglyza. Has stopped due to insurance difficulty. Should [place] get prescription back at some point. Other medical issues stable with good blood pressure. Relevant past medical, surgical, family and social history reviewed and updated as indicated. Interim medical history since our last visit reviewed. Allergies and medications reviewed and updated.  Review of Systems  Constitutional: Negative.   Respiratory: Negative.   Cardiovascular: Negative.     Per HPI unless specifically indicated above     Objective:    BP 120/76 mmHg  Pulse 103  Temp(Src) 98.2 F (36.8 C)  Ht 5' 8.6" (1.742 m)  Wt 252 lb (114.306 kg)  BMI 37.67 kg/m2  SpO2 97%  Wt Readings from Last 3 Encounters:  07/06/15 252 lb (114.306 kg)  03/22/15 261 lb (118.389 kg)  11/27/14 255 lb (115.667 kg)    Physical Exam  Constitutional: He is oriented to person, place, and time. He appears well-developed and well-nourished. No distress.  HENT:  Head: Normocephalic and atraumatic.  Right Ear: Hearing normal.  Left Ear: Hearing normal.  Nose: Nose normal.  Eyes: Conjunctivae and lids are normal. Right eye exhibits no discharge. Left eye exhibits no discharge. No scleral icterus.  Cardiovascular: Normal rate, regular rhythm and normal heart sounds.   Pulmonary/Chest: Effort normal and breath sounds normal. No respiratory distress.  Musculoskeletal: Normal range of motion.  Neurological: He is alert and oriented to person, place, and time.  Skin: Skin is intact.  No rash noted.  Psychiatric: He has a normal mood and affect. His speech is normal and behavior is normal. Judgment and thought content normal. Cognition and memory are normal.    Results for orders placed or performed in visit on 05/21/15  CoaguChek XS/INR Waived  Result Value Ref Range   INR 2.0 (H) 0.9 - 1.1   Prothrombin Time 23.7 sec      Assessment & Plan:   Problem List Items Addressed This Visit      Cardiovascular and Mediastinum   Atrial fibrillation    The current medical regimen is effective;  continue present plan and medications. Continue monthly INR INR 4.1 today which is way too high Patient's diet has changed dramatically as his pocketbook is empty and eating awful thin. We will hold warfarin for 2 days then restarted at 4 mg until patient gets back on his regular diet then we'll restart 5 mg as patient has been well controlled on that.       Relevant Medications   amLODipine (NORVASC) 10 MG tablet   warfarin (COUMADIN) 4 MG tablet   Essential hypertension    The current medical regimen is effective;  continue present plan and medications.       Relevant Medications   amLODipine (NORVASC) 10 MG tablet   warfarin (COUMADIN) 4 MG tablet     Endocrine   Diabetes mellitus - Primary    Discussed with patient good control for now but is been off  Onglyza for 3 weeks patient will shop her insurance may cover that or one of its cousins will notify me at that point and I'll be up to refill        Other   Hyperlipidemia    The current medical regimen is effective;  continue present plan and medications.       Relevant Medications   amLODipine (NORVASC) 10 MG tablet   warfarin (COUMADIN) 4 MG tablet    Other Visit Diagnoses    Diabetes mellitus without complication        Relevant Orders    Basic metabolic panel    Bayer DCA Hb A1c Waived    LP+ALT+AST Piccolo, Waived    Hyperlipemia        Relevant Medications    amLODipine (NORVASC) 10 MG tablet     warfarin (COUMADIN) 4 MG tablet    Other Relevant Orders    Basic metabolic panel    Bayer DCA Hb A1c Waived    LP+ALT+AST Piccolo, Waived    Disease of cardiovascular system        Relevant Medications    amLODipine (NORVASC) 10 MG tablet    warfarin (COUMADIN) 4 MG tablet    Other Relevant Orders    Basic metabolic panel    Bayer DCA Hb A1c Waived    LP+ALT+AST Piccolo, Waived        Follow up plan: Return in about 3 months (around 10/06/2015), or if symptoms worsen or fail to improve, for INR 2 weeks DM 3 mo.

## 2015-07-06 NOTE — Assessment & Plan Note (Signed)
Discussed with patient good control for now but is been off Onglyza for 3 weeks patient will shop her insurance may cover that or one of its cousins will notify me at that point and I'll be up to refill

## 2015-07-06 NOTE — Assessment & Plan Note (Signed)
The current medical regimen is effective;  continue present plan and medications.  

## 2015-07-07 LAB — LP+ALT+AST PICCOLO, WAIVED
ALT (SGPT) Piccolo, Waived: 35 U/L (ref 10–47)
AST (SGOT) Piccolo, Waived: 36 U/L (ref 11–38)
Chol/HDL Ratio Piccolo,Waive: 2.9 mg/dL
Cholesterol Piccolo, Waived: 91 mg/dL (ref <200)
HDL Chol Piccolo, Waived: 31 mg/dL — ABNORMAL LOW (ref >59)
LDL Chol Calc Piccolo Waived: 27 mg/dL (ref <100)
Triglycerides Piccolo,Waived: 167 mg/dL — ABNORMAL HIGH (ref <150)
VLDL Chol Calc Piccolo,Waive: 33 mg/dL — ABNORMAL HIGH (ref <30)

## 2015-07-07 LAB — BASIC METABOLIC PANEL
BUN/Creatinine Ratio: 9 — ABNORMAL LOW (ref 10–22)
BUN: 11 mg/dL (ref 8–27)
CO2: 24 mmol/L (ref 18–29)
Calcium: 9.3 mg/dL (ref 8.6–10.2)
Chloride: 103 mmol/L (ref 97–108)
Creatinine, Ser: 1.28 mg/dL — ABNORMAL HIGH (ref 0.76–1.27)
GFR calc Af Amer: 67 mL/min/{1.73_m2} (ref >59)
GFR calc non Af Amer: 58 mL/min/{1.73_m2} — ABNORMAL LOW (ref >59)
Glucose: 121 mg/dL — ABNORMAL HIGH (ref 65–99)
Potassium: 3.5 mmol/L (ref 3.5–5.2)
Sodium: 146 mmol/L — ABNORMAL HIGH (ref 134–144)

## 2015-07-07 LAB — PROTIME-INR
INR: 4.1 — ABNORMAL HIGH (ref 0.8–1.2)
Prothrombin Time: 49.2 s — ABNORMAL HIGH (ref 9.1–12.0)

## 2015-07-07 LAB — BAYER DCA HB A1C WAIVED: HB A1C (BAYER DCA - WAIVED): 6 % (ref <7.0)

## 2015-07-20 ENCOUNTER — Ambulatory Visit (INDEPENDENT_AMBULATORY_CARE_PROVIDER_SITE_OTHER): Payer: Medicare Other | Admitting: Family Medicine

## 2015-07-20 ENCOUNTER — Encounter: Payer: Self-pay | Admitting: Family Medicine

## 2015-07-20 VITALS — BP 142/90 | HR 98 | Temp 98.0°F | Wt 257.8 lb

## 2015-07-20 DIAGNOSIS — I2510 Atherosclerotic heart disease of native coronary artery without angina pectoris: Secondary | ICD-10-CM

## 2015-07-20 DIAGNOSIS — I4891 Unspecified atrial fibrillation: Secondary | ICD-10-CM

## 2015-07-20 LAB — COAGUCHEK XS/INR WAIVED
INR: 2.4 — ABNORMAL HIGH (ref 0.9–1.1)
Prothrombin Time: 28.2 s

## 2015-07-20 NOTE — Progress Notes (Signed)
   BP 142/90 mmHg  Pulse 98  Temp(Src) 98 F (36.7 C)  Wt 257 lb 12.8 oz (116.937 kg)  SpO2 97%   Subjective:    Patient ID: Ernest [name], male    DOB: 1951-08-03, 64 y.o.   MRN: [id number]  CC: Coumadin management  HPI: This patient is a 64 y.o. male who presents for coumadin management. The expected duration of coumadin treatment is lifelong The reason for anticoagulation is  A. Fib.  Present Coumadin dose: Goal: 2.0-3.0  Excessive bruising: no Nose bleeding: no Rectal bleeding: no Prolonged menstrual cycles: N/A Eating diet with consistent amounts of foods containing Vitamin K:yes Any recent antibiotic use? no  ROS: Per HPI unless specifically indicated above     Objective:    BP 142/90 mmHg  Pulse 98  Temp(Src) 98 F (36.7 C)  Wt 257 lb 12.8 oz (116.937 kg)  SpO2 97%  Wt Readings from Last 3 Encounters:  07/20/15 257 lb 12.8 oz (116.937 kg)  07/06/15 252 lb (114.306 kg)  03/22/15 261 lb (118.389 kg)     General: Well appearing, well nourished in no distress.  Normal mood and affect. Skin: No excessive bruising or rash  Last INR: 2.4 Last PT: 28.2   Last CBC:  Lab Results  Component Value Date   WBC 10.8* 07/27/2014   HGB 14.4 07/27/2014   HCT 43.8 07/27/2014   MCV 81 07/27/2014   PLT 186 07/27/2014    Results for orders placed or performed in visit on 07/06/15  Protime-INR  Result Value Ref Range   INR 4.1 (H) 0.8 - 1.2   Prothrombin Time 49.2 (H) 9.1 - 12.0 sec  Basic metabolic panel  Result Value Ref Range   Glucose 121 (H) 65 - 99 mg/dL   BUN 11 8 - 27 mg/dL   Creatinine, Ser 1.28 (H) 0.76 - 1.27 mg/dL   GFR calc non Af Amer 58 (L) >59 mL/min/1.73   GFR calc Af Amer 67 >59 mL/min/1.73   BUN/Creatinine Ratio 9 (L) 10 - 22   Sodium 146 (H) 134 - 144 mmol/L   Potassium 3.5 3.5 - 5.2 mmol/L   Chloride 103 97 - 108 mmol/L   CO2 24 18 - 29 mmol/L   Calcium 9.3 8.6 - 10.2 mg/dL  Bayer DCA Hb A1c Waived  Result Value Ref Range   Bayer  DCA Hb A1c Waived 6.0 <7.0 %  LP+ALT+AST Piccolo, Waived  Result Value Ref Range   ALT (SGPT) Piccolo, Waived 35 10 - 47 U/L   AST (SGOT) Piccolo, Waived 36 11 - 38 U/L   Cholesterol Piccolo, Waived 91 <200 mg/dL   HDL Chol Piccolo, Waived 31 (L) >59 mg/dL   Triglycerides Piccolo,Waived 167 (H) <150 mg/dL   Chol/HDL Ratio Piccolo,Waive 2.9 mg/dL   LDL Chol Calc Piccolo Waived 27 <100 mg/dL   VLDL Chol Calc Piccolo,Waive 33 (H) <30 mg/dL       Assessment:     ICD-9-CM ICD-10-CM   1. Atrial fibrillation, unspecified 427.31 I48.91 CoaguChek XS/INR Waived    Plan:   Discussed current plan face-to-face with patient. For coumadin dosing, elected to continue current dose. Will plan to recheck INR in 1 month.

## 2015-07-29 ENCOUNTER — Telehealth: Payer: Self-pay | Admitting: Family Medicine

## 2015-07-29 MED ORDER — SITAGLIPTIN PHOSPHATE 100 MG PO TABS: 100.0000 mg | ORAL_TABLET | Freq: Every day | ORAL | Status: DC

## 2015-07-29 NOTE — Telephone Encounter (Signed)
Pt called stated Onglyza is too expensive, wants to know if something else can be sent to the pharmacy. Pharmacy is [facility] in [place]. Thanks.

## 2015-07-29 NOTE — Telephone Encounter (Signed)
Pt came in and would like to have the cheapest version of onglyza sent to [facility] [place]

## 2015-08-19 ENCOUNTER — Ambulatory Visit: Payer: Medicare Other | Admitting: Family Medicine

## 2015-10-06 ENCOUNTER — Ambulatory Visit: Payer: Medicare Other | Admitting: Family Medicine

## 2015-10-10 ENCOUNTER — Other Ambulatory Visit: Payer: Self-pay | Admitting: Family Medicine

## 2015-10-11 ENCOUNTER — Other Ambulatory Visit: Payer: Self-pay | Admitting: Family Medicine

## 2015-10-12 ENCOUNTER — Telehealth: Payer: Self-pay | Admitting: Cardiovascular Disease

## 2015-10-12 NOTE — Telephone Encounter (Signed)
3 attempts made to schedule OD fu appt.  LMOV to call office for scheduling    Deleting Recall.

## 2015-11-17 ENCOUNTER — Encounter: Payer: Self-pay | Admitting: Family Medicine

## 2015-11-17 ENCOUNTER — Ambulatory Visit (INDEPENDENT_AMBULATORY_CARE_PROVIDER_SITE_OTHER): Payer: Medicare Other | Admitting: Family Medicine

## 2015-11-17 VITALS — BP 120/77 | HR 89 | Temp 97.7°F | Ht 68.3 in | Wt 254.0 lb

## 2015-11-17 DIAGNOSIS — E119 Type 2 diabetes mellitus without complications: Secondary | ICD-10-CM | POA: Diagnosis not present

## 2015-11-17 DIAGNOSIS — I482 Chronic atrial fibrillation, unspecified: Secondary | ICD-10-CM

## 2015-11-17 DIAGNOSIS — Z23 Encounter for immunization: Secondary | ICD-10-CM

## 2015-11-17 LAB — BAYER DCA HB A1C WAIVED: HB A1C (BAYER DCA - WAIVED): 5.5 % (ref <7.0)

## 2015-11-17 LAB — COAGUCHEK XS/INR WAIVED
INR: 1.5 — ABNORMAL HIGH (ref 0.9–1.1)
Prothrombin Time: 18.5 s

## 2015-11-17 MED ORDER — WARFARIN SODIUM 5 MG PO TABS: 5.0000 mg | ORAL_TABLET | Freq: Every day | ORAL | Status: DC

## 2015-11-17 NOTE — Assessment & Plan Note (Signed)
Discussed with patient risks benefits of warfarin and need for intense monitoring and continuous monitoring. Will restart warfarin 5 mg Recheck 2 weeks for INR

## 2015-11-17 NOTE — Assessment & Plan Note (Signed)
Patient doing real well with diabetes no hypoglycemic spells will continue current care. Recheck hemoglobin A1c 3 months

## 2015-11-17 NOTE — Progress Notes (Signed)
BP 120/77 mmHg  Pulse 89  Temp(Src) 97.7 F (36.5 C)  Ht 5' 8.3" (1.735 m)  Wt 254 lb (115.214 kg)  BMI 38.27 kg/m2  SpO2 99%   Subjective:    Patient ID: Ernest [name], male    DOB: 07-Dec-1951, 64 y.o.   MRN: [id number]  HPI: Ernest [name] is a 64 y.o. male  Chief Complaint  Patient presents with  . Diabetes  . Anticoagulation   Asian due to looking after his daughter with severe coronary artery disease has been out of touch with the office since July. Diabetes is done well and noted low blood sugar spells Hemoglobin A1c here today of 5.5 Patient is lost some weight Eating a little healthier Patient on chart review for INR patient was to stay on 4 mg total his diet improved and then go back to 5 mg patient is out of 5 mg tablets and INR is low at 1.5. Discussed forcefully with patient risk of stroke etc. with poor control. Reiterated he would be unable to help his daughter at all.  Relevant past medical, surgical, family and social history reviewed and updated as indicated. Interim medical history since our last visit reviewed. Allergies and medications reviewed and updated.  Review of Systems  Constitutional: Negative.   Respiratory: Negative.   Cardiovascular: Negative.     Per HPI unless specifically indicated above     Objective:    BP 120/77 mmHg  Pulse 89  Temp(Src) 97.7 F (36.5 C)  Ht 5' 8.3" (1.735 m)  Wt 254 lb (115.214 kg)  BMI 38.27 kg/m2  SpO2 99%  Wt Readings from Last 3 Encounters:  11/17/15 254 lb (115.214 kg)  07/20/15 257 lb 12.8 oz (116.937 kg)  07/06/15 252 lb (114.306 kg)    Physical Exam  Constitutional: He is oriented to person, place, and time. He appears well-developed and well-nourished. No distress.  HENT:  Head: Normocephalic and atraumatic.  Right Ear: Hearing normal.  Left Ear: Hearing normal.  Nose: Nose normal.  Eyes: Conjunctivae and lids are normal. Right eye exhibits no discharge. Left eye exhibits no  discharge. No scleral icterus.  Cardiovascular: Normal heart sounds.   Pulmonary/Chest: Effort normal and breath sounds normal. No respiratory distress.  Musculoskeletal: Normal range of motion.  Neurological: He is alert and oriented to person, place, and time.  Skin: Skin is intact. No rash noted.  Psychiatric: He has a normal mood and affect. His speech is normal and behavior is normal. Judgment and thought content normal. Cognition and memory are normal.    Results for orders placed or performed in visit on 07/20/15  CoaguChek XS/INR Waived  Result Value Ref Range   INR 2.4 (H) 0.9 - 1.1   Prothrombin Time 28.2 sec      Assessment & Plan:   Problem List Items Addressed This Visit      Cardiovascular and Mediastinum   Atrial fibrillation ([place])    Discussed with patient risks benefits of warfarin and need for intense monitoring and continuous monitoring. Will restart warfarin 5 mg Recheck 2 weeks for INR      Relevant Medications   warfarin (COUMADIN) 5 MG tablet   Other Relevant Orders   CoaguChek XS/INR Waived    Other Visit Diagnoses    Diabetes mellitus without complication ([place])    -  Primary    Relevant Medications    ONGLYZA 5 MG TABS tablet    Other Relevant Orders    Bayer [facility]  Hb A1c Waived    Immunization due        Relevant Orders    Flu Vaccine QUAD 36+ mos PF IM (Fluarix & Fluzone Quad PF) (Completed)        Follow up plan: Return for Will schedule physical, needs INR in 2 weeks.

## 2015-11-25 ENCOUNTER — Ambulatory Visit (INDEPENDENT_AMBULATORY_CARE_PROVIDER_SITE_OTHER): Payer: Medicare Other | Admitting: Family Medicine

## 2015-11-25 ENCOUNTER — Encounter: Payer: Self-pay | Admitting: Family Medicine

## 2015-11-25 VITALS — BP 114/79 | HR 94 | Temp 98.5°F | Ht 68.8 in | Wt 254.0 lb

## 2015-11-25 DIAGNOSIS — I482 Chronic atrial fibrillation, unspecified: Secondary | ICD-10-CM

## 2015-11-25 DIAGNOSIS — Z23 Encounter for immunization: Secondary | ICD-10-CM | POA: Diagnosis not present

## 2015-11-25 DIAGNOSIS — N183 Chronic kidney disease, stage 3 (moderate): Secondary | ICD-10-CM

## 2015-11-25 DIAGNOSIS — I25111 Atherosclerotic heart disease of native coronary artery with angina pectoris with documented spasm: Secondary | ICD-10-CM

## 2015-11-25 DIAGNOSIS — Z0000 Encounter for general adult medical examination without abnormal findings: Secondary | ICD-10-CM | POA: Diagnosis not present

## 2015-11-25 DIAGNOSIS — I10 Essential (primary) hypertension: Secondary | ICD-10-CM

## 2015-11-25 DIAGNOSIS — E785 Hyperlipidemia, unspecified: Secondary | ICD-10-CM

## 2015-11-25 DIAGNOSIS — G2581 Restless legs syndrome: Secondary | ICD-10-CM

## 2015-11-25 DIAGNOSIS — E1122 Type 2 diabetes mellitus with diabetic chronic kidney disease: Secondary | ICD-10-CM

## 2015-11-25 DIAGNOSIS — K219 Gastro-esophageal reflux disease without esophagitis: Secondary | ICD-10-CM

## 2015-11-25 DIAGNOSIS — N400 Enlarged prostate without lower urinary tract symptoms: Secondary | ICD-10-CM | POA: Insufficient documentation

## 2015-11-25 DIAGNOSIS — I129 Hypertensive chronic kidney disease with stage 1 through stage 4 chronic kidney disease, or unspecified chronic kidney disease: Secondary | ICD-10-CM | POA: Diagnosis not present

## 2015-11-25 DIAGNOSIS — Z113 Encounter for screening for infections with a predominantly sexual mode of transmission: Secondary | ICD-10-CM

## 2015-11-25 LAB — URINALYSIS, ROUTINE W REFLEX MICROSCOPIC
Bilirubin, UA: NEGATIVE
Glucose, UA: NEGATIVE
Ketones, UA: NEGATIVE
Nitrite, UA: NEGATIVE
Protein, UA: NEGATIVE
Specific Gravity, UA: 1.015 (ref 1.005–1.030)
Urobilinogen, Ur: 1 mg/dL (ref 0.2–1.0)
pH, UA: 7 (ref 5.0–7.5)

## 2015-11-25 LAB — MICROSCOPIC EXAMINATION

## 2015-11-25 MED ORDER — METFORMIN HCL 500 MG PO TABS: 1000.0000 mg | ORAL_TABLET | Freq: Two times a day (BID) | ORAL | Status: DC

## 2015-11-25 MED ORDER — RAMIPRIL 5 MG PO CAPS: 5.0000 mg | ORAL_CAPSULE | Freq: Every day | ORAL | Status: DC

## 2015-11-25 MED ORDER — GLIPIZIDE 5 MG PO TABS
5.0000 mg | ORAL_TABLET | Freq: Every day | ORAL | Status: DC
Start: 2015-11-25 — End: 2016-09-26

## 2015-11-25 MED ORDER — ATORVASTATIN CALCIUM 40 MG PO TABS: 40.0000 mg | ORAL_TABLET | Freq: Every day | ORAL | Status: DC

## 2015-11-25 MED ORDER — ONGLYZA 5 MG PO TABS: 5.0000 mg | ORAL_TABLET | Freq: Every day | ORAL | Status: DC

## 2015-11-25 MED ORDER — GABAPENTIN 600 MG PO TABS: 1200.0000 mg | ORAL_TABLET | Freq: Every day | ORAL | Status: DC

## 2015-11-25 MED ORDER — FUROSEMIDE 40 MG PO TABS: 20.0000 mg | ORAL_TABLET | Freq: Two times a day (BID) | ORAL | Status: DC

## 2015-11-25 MED ORDER — SITAGLIPTIN PHOSPHATE 100 MG PO TABS: 100.0000 mg | ORAL_TABLET | Freq: Every day | ORAL | Status: DC

## 2015-11-25 MED ORDER — METOPROLOL TARTRATE 50 MG PO TABS
50.0000 mg | ORAL_TABLET | Freq: Two times a day (BID) | ORAL | Status: DC
Start: 2015-11-25 — End: 2017-01-30

## 2015-11-25 MED ORDER — ISOSORBIDE MONONITRATE ER 60 MG PO TB24: 60.0000 mg | ORAL_TABLET | Freq: Every day | ORAL | Status: DC

## 2015-11-25 MED ORDER — AMLODIPINE BESYLATE 10 MG PO TABS: 10.0000 mg | ORAL_TABLET | Freq: Every day | ORAL | Status: DC

## 2015-11-25 NOTE — Assessment & Plan Note (Signed)
No chest pain takes I'm door faithfully

## 2015-11-25 NOTE — Assessment & Plan Note (Addendum)
The current medical regimen is effective;  continue present plan and medications. Takes OTC prilosec

## 2015-11-25 NOTE — Assessment & Plan Note (Signed)
Controlled with gabapentin

## 2015-11-25 NOTE — Assessment & Plan Note (Signed)
Taking wafarin

## 2015-11-25 NOTE — Progress Notes (Signed)
BP 114/79 mmHg  Pulse 94  Temp(Src) 98.5 F (36.9 C)  Ht 5' 8.8" (1.748 m)  Wt 254 lb (115.214 kg)  BMI 37.71 kg/m2  SpO2 98%   Subjective:    Patient ID: Ernest [name], male    DOB: 07/02/1951, 64 y.o.   MRN: [id number]  HPI: Ernest [name] is a 64 y.o. male  Chief Complaint  Patient presents with  . Annual Exam   patient also for recheck medical problems been doing well no complaints of low blood sugars taking medications for diabetes cholesterol hypertension atrial fibrillation all doing well. Takes medicines faithfully and with no side effects.  Relevant past medical, surgical, family and social history reviewed and updated as indicated. Interim medical history since our last visit reviewed. Allergies and medications reviewed and updated.  Review of Systems  Constitutional: Negative.   HENT: Negative.   Eyes: Negative.   Respiratory: Negative.   Cardiovascular: Negative.   Gastrointestinal: Negative.   Endocrine: Negative.   Genitourinary: Negative.   Musculoskeletal: Negative.   Skin: Negative.   Allergic/Immunologic: Negative.   Neurological: Negative.   Hematological: Negative.   Psychiatric/Behavioral: Negative.     Per HPI unless specifically indicated above     Objective:    BP 114/79 mmHg  Pulse 94  Temp(Src) 98.5 F (36.9 C)  Ht 5' 8.8" (1.748 m)  Wt 254 lb (115.214 kg)  BMI 37.71 kg/m2  SpO2 98%  Wt Readings from Last 3 Encounters:  11/25/15 254 lb (115.214 kg)  11/17/15 254 lb (115.214 kg)  07/20/15 257 lb 12.8 oz (116.937 kg)    Physical Exam  Constitutional: He is oriented to person, place, and time. He appears well-developed and well-nourished.  HENT:  Head: Normocephalic and atraumatic.  Right Ear: External ear normal.  Left Ear: External ear normal.  Eyes: Conjunctivae and EOM are normal. Pupils are equal, round, and reactive to light.  Neck: Normal range of motion. Neck supple.  Cardiovascular: Normal rate, regular  rhythm, normal heart sounds and intact distal pulses.   Pulmonary/Chest: Effort normal and breath sounds normal.  Abdominal: Soft. Bowel sounds are normal. There is no splenomegaly or hepatomegaly.  Genitourinary: Rectum normal and penis normal.  Prostate enlarged  Musculoskeletal: Normal range of motion.  Neurological: He is alert and oriented to person, place, and time. He has normal reflexes.  Skin: No rash noted. No erythema.  Psychiatric: He has a normal mood and affect. His behavior is normal. Judgment and thought content normal.    Results for orders placed or performed in visit on 11/17/15  Bayer DCA Hb A1c Waived  Result Value Ref Range   Bayer DCA Hb A1c Waived 5.5 <7.0 %  CoaguChek XS/INR Waived  Result Value Ref Range   INR 1.5 (H) 0.9 - 1.1   Prothrombin Time 18.5 sec      Assessment & Plan:   Problem List Items Addressed This Visit      Cardiovascular and Mediastinum   Type 2 DM with CKD stage 3 and hypertension ([place])    The current medical regimen is effective;  continue present plan and medications.       Relevant Medications   furosemide (LASIX) 40 MG tablet   metFORMIN (GLUCOPHAGE) 500 MG tablet   ramipril (ALTACE) 5 MG capsule   atorvastatin (LIPITOR) 40 MG tablet   glipiZIDE (GLUCOTROL) 5 MG tablet   metoprolol (LOPRESSOR) 50 MG tablet   amLODipine (NORVASC) 10 MG tablet   isosorbide mononitrate (IMDUR)  60 MG 24 hr tablet   sitaGLIPtin (JANUVIA) 100 MG tablet   ONGLYZA 5 MG TABS tablet   Essential hypertension    The current medical regimen is effective;  continue present plan and medications.       Relevant Medications   furosemide (LASIX) 40 MG tablet   ramipril (ALTACE) 5 MG capsule   atorvastatin (LIPITOR) 40 MG tablet   metoprolol (LOPRESSOR) 50 MG tablet   amLODipine (NORVASC) 10 MG tablet   isosorbide mononitrate (IMDUR) 60 MG 24 hr tablet   Other Relevant Orders   Comprehensive metabolic panel   CBC with Differential/Platelet    Urinalysis, Routine w reflex microscopic (not at [facility])   TSH   CAD, NATIVE VESSEL    No chest pain takes I'm door faithfully      Relevant Medications   furosemide (LASIX) 40 MG tablet   ramipril (ALTACE) 5 MG capsule   atorvastatin (LIPITOR) 40 MG tablet   metoprolol (LOPRESSOR) 50 MG tablet   amLODipine (NORVASC) 10 MG tablet   isosorbide mononitrate (IMDUR) 60 MG 24 hr tablet   Atrial fibrillation (HCC)    Taking wafarin      Relevant Medications   furosemide (LASIX) 40 MG tablet   ramipril (ALTACE) 5 MG capsule   atorvastatin (LIPITOR) 40 MG tablet   metoprolol (LOPRESSOR) 50 MG tablet   amLODipine (NORVASC) 10 MG tablet   isosorbide mononitrate (IMDUR) 60 MG 24 hr tablet   Other Relevant Orders   Comprehensive metabolic panel   CBC with Differential/Platelet   Urinalysis, Routine w reflex microscopic (not at [facility])   TSH     Digestive   GERD (gastroesophageal reflux disease)    The current medical regimen is effective;  continue present plan and medications. Takes OTC prilosec        Genitourinary   BPH (benign prostatic hyperplasia)   Relevant Orders   PSA     Other   Restless legs    Controlled with gabapentin      Relevant Medications   gabapentin (NEURONTIN) 600 MG tablet   Other Relevant Orders   TSH   Hyperlipidemia    The current medical regimen is effective;  continue present plan and medications.       Relevant Medications   furosemide (LASIX) 40 MG tablet   ramipril (ALTACE) 5 MG capsule   atorvastatin (LIPITOR) 40 MG tablet   metoprolol (LOPRESSOR) 50 MG tablet   amLODipine (NORVASC) 10 MG tablet   isosorbide mononitrate (IMDUR) 60 MG 24 hr tablet   Other Relevant Orders   Comprehensive metabolic panel   Lipid panel   CBC with Differential/Platelet   Urinalysis, Routine w reflex microscopic (not at [facility])   TSH    Other Visit Diagnoses    Routine screening for STI (sexually transmitted infection)    -  Primary    Relevant Orders     Hepatitis C Antibody    Immunization due        Relevant Orders    Pneumococcal conjugate vaccine 13-valent IM    PE (physical exam), annual            Follow up plan: Return in about 3 months (around 02/23/2016) for PT INR 1-2 weeks 3 mo for a1c.

## 2015-11-25 NOTE — Assessment & Plan Note (Signed)
The current medical regimen is effective;  continue present plan and medications.  

## 2015-11-26 LAB — COMPREHENSIVE METABOLIC PANEL
ALT: 19 IU/L (ref 0–44)
AST: 17 IU/L (ref 0–40)
Albumin/Globulin Ratio: 1.9 (ref 1.1–2.5)
Albumin: 4.4 g/dL (ref 3.6–4.8)
Alkaline Phosphatase: 85 IU/L (ref 39–117)
BUN/Creatinine Ratio: 15 (ref 10–22)
BUN: 13 mg/dL (ref 8–27)
Bilirubin Total: 0.8 mg/dL (ref 0.0–1.2)
CO2: 25 mmol/L (ref 18–29)
Calcium: 8.8 mg/dL (ref 8.6–10.2)
Chloride: 101 mmol/L (ref 97–106)
Creatinine, Ser: 0.87 mg/dL (ref 0.76–1.27)
GFR calc Af Amer: 105 mL/min/{1.73_m2} (ref >59)
GFR calc non Af Amer: 91 mL/min/{1.73_m2} (ref >59)
Globulin, Total: 2.3 g/dL (ref 1.5–4.5)
Glucose: 112 mg/dL — ABNORMAL HIGH (ref 65–99)
Potassium: 3.9 mmol/L (ref 3.5–5.2)
Sodium: 143 mmol/L (ref 136–144)
Total Protein: 6.7 g/dL (ref 6.0–8.5)

## 2015-11-26 LAB — CBC WITH DIFFERENTIAL/PLATELET
Basophils Absolute: 0 10*3/uL (ref 0.0–0.2)
Basos: 0 %
EOS (ABSOLUTE): 0.2 10*3/uL (ref 0.0–0.4)
Eos: 2 %
Hematocrit: 41.8 % (ref 37.5–51.0)
Hemoglobin: 14.1 g/dL (ref 12.6–17.7)
Immature Grans (Abs): 0 10*3/uL (ref 0.0–0.1)
Immature Granulocytes: 0 %
Lymphocytes Absolute: 4.7 10*3/uL — ABNORMAL HIGH (ref 0.7–3.1)
Lymphs: 45 %
MCH: 28 pg (ref 26.6–33.0)
MCHC: 33.7 g/dL (ref 31.5–35.7)
MCV: 83 fL (ref 79–97)
Monocytes Absolute: 0.6 10*3/uL (ref 0.1–0.9)
Monocytes: 6 %
Neutrophils Absolute: 4.9 10*3/uL (ref 1.4–7.0)
Neutrophils: 47 %
Platelets: 226 10*3/uL (ref 150–379)
RBC: 5.04 x10E6/uL (ref 4.14–5.80)
RDW: 14.9 % (ref 12.3–15.4)
WBC: 10.5 10*3/uL (ref 3.4–10.8)

## 2015-11-26 LAB — LIPID PANEL
Chol/HDL Ratio: 4 ratio units (ref 0.0–5.0)
Cholesterol, Total: 109 mg/dL (ref 100–199)
HDL: 27 mg/dL — ABNORMAL LOW (ref >39)
LDL Calculated: 36 mg/dL (ref 0–99)
Triglycerides: 228 mg/dL — ABNORMAL HIGH (ref 0–149)
VLDL Cholesterol Cal: 46 mg/dL — ABNORMAL HIGH (ref 5–40)

## 2015-11-26 LAB — TSH: TSH: 3.34 u[IU]/mL (ref 0.450–4.500)

## 2015-11-26 LAB — HEPATITIS C ANTIBODY: Hep C Virus Ab: <0.1 s/co ratio (ref 0.0–0.9)

## 2015-11-26 LAB — PSA: Prostate Specific Ag, Serum: 0.9 ng/mL (ref 0.0–4.0)

## 2015-11-29 ENCOUNTER — Encounter: Payer: Self-pay | Admitting: Family Medicine

## 2015-12-01 ENCOUNTER — Encounter: Payer: Self-pay | Admitting: Family Medicine

## 2015-12-01 ENCOUNTER — Ambulatory Visit (INDEPENDENT_AMBULATORY_CARE_PROVIDER_SITE_OTHER): Payer: Medicare Other | Admitting: Family Medicine

## 2015-12-01 VITALS — BP 118/79 | HR 88 | Temp 98.5°F | Ht 68.8 in | Wt 256.0 lb

## 2015-12-01 DIAGNOSIS — I482 Chronic atrial fibrillation, unspecified: Secondary | ICD-10-CM

## 2015-12-01 LAB — COAGUCHEK XS/INR WAIVED
INR: 2.3 — ABNORMAL HIGH (ref 0.9–1.1)
Prothrombin Time: 27.7 s

## 2015-12-01 NOTE — Assessment & Plan Note (Signed)
Patient doing well on warfarin INR of 2.3 today will continue current medication and diet recheck INR in 1 month

## 2015-12-01 NOTE — Progress Notes (Signed)
BP 118/79 mmHg  Pulse 88  Temp(Src) 98.5 F (36.9 C)  Ht 5' 8.8" (1.748 m)  Wt 256 lb (116.121 kg)  BMI 38.00 kg/m2  SpO2 98%   Subjective:    Patient ID: Ernest [name], male    DOB: 1951-04-23, 64 y.o.   MRN: [id number]  HPI: Ernest [name] is a 64 y.o. male  Chief Complaint  Patient presents with  . Atrial Fibrillation   patient doing well taking warfarin faithfully with no side effects no bleeding no bruising no complaints or concerns Diet is been steady  Relevant past medical, surgical, family and social history reviewed and updated as indicated. Interim medical history since our last visit reviewed. Allergies and medications reviewed and updated.  Review of Systems  Constitutional: Negative.   Respiratory: Negative.   Cardiovascular: Negative.     Per HPI unless specifically indicated above     Objective:    BP 118/79 mmHg  Pulse 88  Temp(Src) 98.5 F (36.9 C)  Ht 5' 8.8" (1.748 m)  Wt 256 lb (116.121 kg)  BMI 38.00 kg/m2  SpO2 98%  Wt Readings from Last 3 Encounters:  12/01/15 256 lb (116.121 kg)  11/25/15 254 lb (115.214 kg)  11/17/15 254 lb (115.214 kg)    Physical Exam  Constitutional: He is oriented to person, place, and time. He appears well-developed and well-nourished. No distress.  HENT:  Head: Normocephalic and atraumatic.  Right Ear: Hearing normal.  Left Ear: Hearing normal.  Nose: Nose normal.  Eyes: Conjunctivae and lids are normal. Right eye exhibits no discharge. Left eye exhibits no discharge. No scleral icterus.  Pulmonary/Chest: Effort normal. No respiratory distress.  Musculoskeletal: Normal range of motion.  Neurological: He is alert and oriented to person, place, and time.  Skin: Skin is intact. No rash noted.  Psychiatric: He has a normal mood and affect. His speech is normal and behavior is normal. Judgment and thought content normal. Cognition and memory are normal.    Results for orders placed or performed in  visit on 11/25/15  Microscopic Examination  Result Value Ref Range   WBC, UA 0-5 0 -  5 /hpf   RBC, UA 0-2 0 -  2 /hpf   Epithelial Cells (non renal) 0-10 0 - 10 /hpf   Bacteria, UA Few None seen/Few  Hepatitis C Antibody  Result Value Ref Range   Hep C Virus Ab <0.1 0.0 - 0.9 s/co ratio  Comprehensive metabolic panel  Result Value Ref Range   Glucose 112 (H) 65 - 99 mg/dL   BUN 13 8 - 27 mg/dL   Creatinine, Ser 0.87 0.76 - 1.27 mg/dL   GFR calc non Af Amer 91 >59 mL/min/1.73   GFR calc Af Amer 105 >59 mL/min/1.73   BUN/Creatinine Ratio 15 10 - 22   Sodium 143 136 - 144 mmol/L   Potassium 3.9 3.5 - 5.2 mmol/L   Chloride 101 97 - 106 mmol/L   CO2 25 18 - 29 mmol/L   Calcium 8.8 8.6 - 10.2 mg/dL   Total Protein 6.7 6.0 - 8.5 g/dL   Albumin 4.4 3.6 - 4.8 g/dL   Globulin, Total 2.3 1.5 - 4.5 g/dL   Albumin/Globulin Ratio 1.9 1.1 - 2.5   Bilirubin Total 0.8 0.0 - 1.2 mg/dL   Alkaline Phosphatase 85 39 - 117 IU/L   AST 17 0 - 40 IU/L   ALT 19 0 - 44 IU/L  Lipid panel  Result Value Ref Range  Cholesterol, Total 109 100 - 199 mg/dL   Triglycerides 228 (H) 0 - 149 mg/dL   HDL 27 (L) >39 mg/dL   VLDL Cholesterol Cal 46 (H) 5 - 40 mg/dL   LDL Calculated 36 0 - 99 mg/dL   Chol/HDL Ratio 4.0 0.0 - 5.0 ratio units  CBC with Differential/Platelet  Result Value Ref Range   WBC 10.5 3.4 - 10.8 x10E3/uL   RBC 5.04 4.14 - 5.80 x10E6/uL   Hemoglobin 14.1 12.6 - 17.7 g/dL   Hematocrit 41.8 37.5 - 51.0 %   MCV 83 79 - 97 fL   MCH 28.0 26.6 - 33.0 pg   MCHC 33.7 31.5 - 35.7 g/dL   RDW 14.9 12.3 - 15.4 %   Platelets 226 150 - 379 x10E3/uL   Neutrophils 47 %   Lymphs 45 %   Monocytes 6 %   Eos 2 %   Basos 0 %   Neutrophils Absolute 4.9 1.4 - 7.0 x10E3/uL   Lymphocytes Absolute 4.7 (H) 0.7 - 3.1 x10E3/uL   Monocytes Absolute 0.6 0.1 - 0.9 x10E3/uL   EOS (ABSOLUTE) 0.2 0.0 - 0.4 x10E3/uL   Basophils Absolute 0.0 0.0 - 0.2 x10E3/uL   Immature Granulocytes 0 %   Immature Grans (Abs)  0.0 0.0 - 0.1 x10E3/uL  Urinalysis, Routine w reflex microscopic (not at [facility])  Result Value Ref Range   Specific Gravity, UA 1.015 1.005 - 1.030   pH, UA 7.0 5.0 - 7.5   Color, UA Yellow Yellow   Appearance Ur Clear Clear   Leukocytes, UA Trace (A) Negative   Protein, UA Negative Negative/Trace   Glucose, UA Negative Negative   Ketones, UA Negative Negative   RBC, UA Trace (A) Negative   Bilirubin, UA Negative Negative   Urobilinogen, Ur 1.0 0.2 - 1.0 mg/dL   Nitrite, UA Negative Negative   Microscopic Examination See below:   PSA  Result Value Ref Range   Prostate Specific Ag, Serum 0.9 0.0 - 4.0 ng/mL  TSH  Result Value Ref Range   TSH 3.340 0.450 - 4.500 uIU/mL      Assessment & Plan:   Problem List Items Addressed This Visit      Cardiovascular and Mediastinum   Atrial fibrillation ([place]) - Primary    Patient doing well on warfarin INR of 2.3 today will continue current medication and diet recheck INR in 1 month      Relevant Orders   CoaguChek XS/INR Waived       Follow up plan: Return in about 4 weeks (around 12/29/2015) for PT INR.

## 2015-12-30 ENCOUNTER — Ambulatory Visit: Payer: Medicare Other | Admitting: Family Medicine

## 2016-01-14 ENCOUNTER — Encounter: Payer: Self-pay | Admitting: Family Medicine

## 2016-02-23 ENCOUNTER — Telehealth: Payer: Self-pay | Admitting: Family Medicine

## 2016-02-23 ENCOUNTER — Encounter: Payer: Self-pay | Admitting: Family Medicine

## 2016-02-23 ENCOUNTER — Ambulatory Visit (INDEPENDENT_AMBULATORY_CARE_PROVIDER_SITE_OTHER): Payer: Medicare Other | Admitting: Family Medicine

## 2016-02-23 VITALS — BP 114/76 | HR 88 | Temp 98.1°F | Ht 68.7 in | Wt 252.0 lb

## 2016-02-23 DIAGNOSIS — N183 Chronic kidney disease, stage 3 (moderate): Secondary | ICD-10-CM | POA: Diagnosis not present

## 2016-02-23 DIAGNOSIS — I10 Essential (primary) hypertension: Secondary | ICD-10-CM | POA: Diagnosis not present

## 2016-02-23 DIAGNOSIS — E1122 Type 2 diabetes mellitus with diabetic chronic kidney disease: Secondary | ICD-10-CM | POA: Diagnosis not present

## 2016-02-23 DIAGNOSIS — I482 Chronic atrial fibrillation, unspecified: Secondary | ICD-10-CM

## 2016-02-23 DIAGNOSIS — I25111 Atherosclerotic heart disease of native coronary artery with angina pectoris with documented spasm: Secondary | ICD-10-CM | POA: Diagnosis not present

## 2016-02-23 DIAGNOSIS — I129 Hypertensive chronic kidney disease with stage 1 through stage 4 chronic kidney disease, or unspecified chronic kidney disease: Secondary | ICD-10-CM | POA: Diagnosis not present

## 2016-02-23 LAB — BAYER DCA HB A1C WAIVED: HB A1C (BAYER DCA - WAIVED): 6.3 % (ref <7.0)

## 2016-02-23 LAB — COAGUCHEK XS/INR WAIVED
INR: 1.4 — ABNORMAL HIGH (ref 0.9–1.1)
Prothrombin Time: 16.6 s

## 2016-02-23 MED ORDER — RIVAROXABAN 20 MG PO TABS: 20.0000 mg | ORAL_TABLET | Freq: Every day | ORAL | Status: DC

## 2016-02-23 NOTE — Assessment & Plan Note (Signed)
Due to atrial fibrillation risk of stroke and other available agents. Patient has insurance now will start Xarelto discontinue warfarin tonight and start Xarelto tonight one a day Discuss risks benefits of Xarelto

## 2016-02-23 NOTE — Assessment & Plan Note (Signed)
The current medical regimen is effective;  continue present plan and medications.  

## 2016-02-23 NOTE — Telephone Encounter (Signed)
Prior Authorization submitted and is pending review.

## 2016-02-23 NOTE — Progress Notes (Signed)
BP 114/76 mmHg  Pulse 88  Temp(Src) 98.1 F (36.7 C)  Ht 5' 8.7" (1.745 m)  Wt 252 lb (114.306 kg)  BMI 37.54 kg/m2  SpO2 96%   Subjective:    Patient ID: Ernest [name], male    DOB: 1951/08/14, 65 y.o.   MRN: [id number]  HPI: Ernest [name] is a 65 y.o. male  Chief Complaint  Patient presents with  . Atrial Fibrillation  . Diabetes   Patient all in all doing well diabetes blood pressure cluster all. No chest pain chest tightness noted low blood sugar spells taking medications faithfully including warfarin takes every evening without fail. Diet is remaining consistent Review patient's INR is low at 1.4 today last time was normal time before that was low. Viewed patient consistently anticoagulated less than half the time and risk of stroke.  Relevant past medical, surgical, family and social history reviewed and updated as indicated. Interim medical history since our last visit reviewed. Allergies and medications reviewed and updated.  Review of Systems  Constitutional: Negative.   Respiratory: Negative.   Cardiovascular: Negative.     Per HPI unless specifically indicated above     Objective:    BP 114/76 mmHg  Pulse 88  Temp(Src) 98.1 F (36.7 C)  Ht 5' 8.7" (1.745 m)  Wt 252 lb (114.306 kg)  BMI 37.54 kg/m2  SpO2 96%  Wt Readings from Last 3 Encounters:  02/23/16 252 lb (114.306 kg)  12/01/15 256 lb (116.121 kg)  11/25/15 254 lb (115.214 kg)    Physical Exam  Constitutional: He is oriented to person, place, and time. He appears well-developed and well-nourished. No distress.  HENT:  Head: Normocephalic and atraumatic.  Right Ear: Hearing normal.  Left Ear: Hearing normal.  Nose: Nose normal.  Eyes: Conjunctivae and lids are normal. Right eye exhibits no discharge. Left eye exhibits no discharge. No scleral icterus.  Cardiovascular: Normal rate, regular rhythm and normal heart sounds.   Pulmonary/Chest: Effort normal and breath sounds normal.  No respiratory distress.  Musculoskeletal: Normal range of motion.  Neurological: He is alert and oriented to person, place, and time.  Skin: Skin is intact. No rash noted.  Psychiatric: He has a normal mood and affect. His speech is normal and behavior is normal. Judgment and thought content normal. Cognition and memory are normal.    Results for orders placed or performed in visit on 12/01/15  CoaguChek XS/INR Waived  Result Value Ref Range   INR 2.3 (H) 0.9 - 1.1   Prothrombin Time 27.7 sec      Assessment & Plan:   Problem List Items Addressed This Visit      Cardiovascular and Mediastinum   Type 2 DM with CKD stage 3 and hypertension ([place])    The current medical regimen is effective;  continue present plan and medications.       Relevant Medications   rivaroxaban (XARELTO) 20 MG TABS tablet   Other Relevant Orders   Bayer DCA Hb A1c Waived   Essential hypertension    The current medical regimen is effective;  continue present plan and medications.       Relevant Medications   rivaroxaban (XARELTO) 20 MG TABS tablet   CAD, NATIVE VESSEL    The current medical regimen is effective;  continue present plan and medications.       Relevant Medications   rivaroxaban (XARELTO) 20 MG TABS tablet   Atrial fibrillation (HCC) - Primary    Due to  atrial fibrillation risk of stroke and other available agents. Patient has insurance now will start Xarelto discontinue warfarin tonight and start Xarelto tonight one a day Discuss risks benefits of Xarelto      Relevant Medications   rivaroxaban (XARELTO) 20 MG TABS tablet   Other Relevant Orders   CoaguChek XS/INR Waived       Follow up plan: Return in about 3 months (around 05/25/2016) for CBC, BMP, lipids, ALt, ast, a1c.

## 2016-02-23 NOTE — Telephone Encounter (Signed)
Pt came in stated he needs Prior Authorization for his Xarelto RX. Pharm is [facility] libr[facility] in [place] is [employer] Thanks.

## 2016-06-08 ENCOUNTER — Ambulatory Visit (INDEPENDENT_AMBULATORY_CARE_PROVIDER_SITE_OTHER): Payer: Medicare Other | Admitting: Family Medicine

## 2016-06-08 ENCOUNTER — Encounter: Payer: Self-pay | Admitting: Family Medicine

## 2016-06-08 VITALS — BP 118/79 | HR 98 | Temp 97.9°F | Ht 68.7 in | Wt 263.0 lb

## 2016-06-08 DIAGNOSIS — E785 Hyperlipidemia, unspecified: Secondary | ICD-10-CM | POA: Diagnosis not present

## 2016-06-08 DIAGNOSIS — I10 Essential (primary) hypertension: Secondary | ICD-10-CM

## 2016-06-08 DIAGNOSIS — I482 Chronic atrial fibrillation, unspecified: Secondary | ICD-10-CM

## 2016-06-08 DIAGNOSIS — E1122 Type 2 diabetes mellitus with diabetic chronic kidney disease: Secondary | ICD-10-CM | POA: Diagnosis not present

## 2016-06-08 DIAGNOSIS — I129 Hypertensive chronic kidney disease with stage 1 through stage 4 chronic kidney disease, or unspecified chronic kidney disease: Secondary | ICD-10-CM

## 2016-06-08 DIAGNOSIS — N183 Chronic kidney disease, stage 3 (moderate): Secondary | ICD-10-CM | POA: Diagnosis not present

## 2016-06-08 LAB — CBC WITH DIFFERENTIAL/PLATELET
Hematocrit: 42.1 % (ref 37.5–51.0)
Hemoglobin: 14.5 g/dL (ref 12.6–17.7)
Lymphocytes Absolute: 4.8 10*3/uL — ABNORMAL HIGH (ref 0.7–3.1)
Lymphs: 51 %
MCH: 28.8 pg (ref 26.6–33.0)
MCHC: 34.4 g/dL (ref 31.5–35.7)
MCV: 84 fL (ref 79–97)
MID (Absolute): 0.6 10*3/uL (ref 0.1–1.6)
MID: 6 %
Neutrophils Absolute: 4 10*3/uL (ref 1.4–7.0)
Neutrophils: 43 %
Platelets: 208 10*3/uL (ref 150–379)
RBC: 5.04 x10E6/uL (ref 4.14–5.80)
RDW: 15 % (ref 12.3–15.4)
WBC: 9.4 10*3/uL (ref 3.4–10.8)

## 2016-06-08 LAB — BAYER DCA HB A1C WAIVED: HB A1C (BAYER DCA - WAIVED): 6.1 % (ref <7.0)

## 2016-06-08 LAB — LP+ALT+AST PICCOLO, WAIVED
ALT (SGPT) Piccolo, Waived: 34 U/L (ref 10–47)
AST (SGOT) Piccolo, Waived: 39 U/L — ABNORMAL HIGH (ref 11–38)
Chol/HDL Ratio Piccolo,Waive: 3.9 mg/dL
Cholesterol Piccolo, Waived: 120 mg/dL (ref <200)
HDL Chol Piccolo, Waived: 31 mg/dL — ABNORMAL LOW (ref >59)
LDL Chol Calc Piccolo Waived: 38 mg/dL (ref <100)
Triglycerides Piccolo,Waived: 253 mg/dL — ABNORMAL HIGH (ref <150)
VLDL Chol Calc Piccolo,Waive: 51 mg/dL — ABNORMAL HIGH (ref <30)

## 2016-06-08 NOTE — Assessment & Plan Note (Signed)
The current medical regimen is effective;  continue present plan and medications.  

## 2016-06-08 NOTE — Progress Notes (Signed)
BP 118/79 mmHg  Pulse 98  Temp(Src) 97.9 F (36.6 C)  Ht 5' 8.7" (1.745 m)  Wt 263 lb (119.296 kg)  BMI 39.18 kg/m2  SpO2 98%   Subjective:    Patient ID: Ernest [name], male    DOB: Feb 27, 1951, 65 y.o.   MRN: [id number]  HPI: Ernest [name] is a 65 y.o. male  Chief Complaint  Patient presents with  . Diabetes  . Hypertension  . Hyperlipidemia  . Atrial Fibrillation   Patient presents today for routine follow up.  A fib - on xarelto, no bleeding or bruising issues noted HTN - BPs WNL when checked. No CP, dizziness, or syncopal episodes DM - BS doing well, no low blood sugar spells.  Hyperlipidemia - Continuing to work on diet and exercise Compliant with all medications, no side effects reported   Relevant past medical, surgical, family and social history reviewed and updated as indicated. Interim medical history since our last visit reviewed. Allergies and medications reviewed and updated.  Review of Systems  Constitutional: Negative.   HENT: Negative.   Eyes: Negative.   Respiratory: Negative.   Cardiovascular: Negative.   Gastrointestinal: Negative.   Endocrine: Negative.   Genitourinary: Negative.   Musculoskeletal: Negative.   Skin: Negative.   Allergic/Immunologic: Negative.   Neurological: Negative.   Hematological: Negative.   Psychiatric/Behavioral: Negative.     Per HPI unless specifically indicated above     Objective:    BP 118/79 mmHg  Pulse 98  Temp(Src) 97.9 F (36.6 C)  Ht 5' 8.7" (1.745 m)  Wt 263 lb (119.296 kg)  BMI 39.18 kg/m2  SpO2 98%  Wt Readings from Last 3 Encounters:  06/08/16 263 lb (119.296 kg)  02/23/16 252 lb (114.306 kg)  12/01/15 256 lb (116.121 kg)    Physical Exam  Constitutional: He is oriented to person, place, and time. He appears well-developed and well-nourished. No distress.  HENT:  Head: Normocephalic and atraumatic.  Right Ear: Hearing normal.  Left Ear: Hearing normal.  Nose: Nose normal.   Eyes: Conjunctivae and lids are normal. Right eye exhibits no discharge. Left eye exhibits no discharge. No scleral icterus.  Cardiovascular: Normal heart sounds.   Pulmonary/Chest: Effort normal and breath sounds normal. No respiratory distress.  Musculoskeletal: Normal range of motion.  Neurological: He is alert and oriented to person, place, and time.  Skin: Skin is intact. No rash noted.  Psychiatric: He has a normal mood and affect. His speech is normal and behavior is normal. Judgment and thought content normal. Cognition and memory are normal.    Results for orders placed or performed in visit on 02/23/16  CoaguChek XS/INR Waived  Result Value Ref Range   INR 1.4 (H) 0.9 - 1.1   Prothrombin Time 16.6 sec  Bayer DCA Hb A1c Waived  Result Value Ref Range   Bayer DCA Hb A1c Waived 6.3 <7.0 %      Assessment & Plan:   Problem List Items Addressed This Visit      Cardiovascular and Mediastinum   Atrial fibrillation ([place])    The current medical regimen is effective;  continue present plan and medications.       Relevant Orders   CBC With Differential/Platelet   LP+ALT+AST Piccolo, Waived   Bayer DCA Hb A1c Waived   Basic metabolic panel   Essential hypertension - Primary    The current medical regimen is effective;  continue present plan and medications.  Relevant Orders   CBC With Differential/Platelet   LP+ALT+AST Piccolo, Waived   Bayer DCA Hb A1c Waived   Basic metabolic panel   Type 2 DM with CKD stage 3 and hypertension (H[facility])    The current medical regimen is effective;  continue present plan and medications.       Relevant Orders   CBC With Differential/Platelet   LP+ALT+AST Piccolo, Waived   Bayer DCA Hb A1c Waived   Basic metabolic panel     Other   Hyperlipidemia    The current medical regimen is effective;  continue present plan and medications.           Follow up plan: Return in about 3 months (around 09/08/2016) for  A1c.

## 2016-06-09 ENCOUNTER — Encounter: Payer: Self-pay | Admitting: Family Medicine

## 2016-06-09 LAB — BASIC METABOLIC PANEL WITH GFR
BUN/Creatinine Ratio: 18 (ref 10–24)
BUN: 17 mg/dL (ref 8–27)
CO2: 24 mmol/L (ref 18–29)
Calcium: 10.1 mg/dL (ref 8.6–10.2)
Chloride: 99 mmol/L (ref 96–106)
Creatinine, Ser: 0.95 mg/dL (ref 0.76–1.27)
GFR calc Af Amer: 96 mL/min/{1.73_m2}
GFR calc non Af Amer: 83 mL/min/{1.73_m2}
Glucose: 129 mg/dL — ABNORMAL HIGH (ref 65–99)
Potassium: 4.4 mmol/L (ref 3.5–5.2)
Sodium: 142 mmol/L (ref 134–144)

## 2016-07-10 ENCOUNTER — Ambulatory Visit (INDEPENDENT_AMBULATORY_CARE_PROVIDER_SITE_OTHER): Payer: Medicare Other | Admitting: Family Medicine

## 2016-07-10 ENCOUNTER — Encounter: Payer: Self-pay | Admitting: Family Medicine

## 2016-07-10 VITALS — BP 137/81 | HR 89 | Temp 98.0°F | Wt 263.0 lb

## 2016-07-10 DIAGNOSIS — J181 Lobar pneumonia, unspecified organism: Principal | ICD-10-CM

## 2016-07-10 DIAGNOSIS — J189 Pneumonia, unspecified organism: Secondary | ICD-10-CM

## 2016-07-10 MED ORDER — AZITHROMYCIN 250 MG PO TABS: 250.0000 mg | ORAL_TABLET | Freq: Every day | ORAL | 0 refills | Status: DC

## 2016-07-10 MED ORDER — ALBUTEROL SULFATE HFA 108 (90 BASE) MCG/ACT IN AERS: 2.0000 | INHALATION_SPRAY | Freq: Four times a day (QID) | RESPIRATORY_TRACT | 0 refills | Status: DC | PRN

## 2016-07-10 MED ORDER — BENZONATATE 100 MG PO CAPS: 100.0000 mg | ORAL_CAPSULE | Freq: Three times a day (TID) | ORAL | 0 refills | Status: DC | PRN

## 2016-07-10 MED ORDER — HYDROCOD POLST-CPM POLST ER 10-8 MG/5ML PO SUER: 5.0000 mL | Freq: Two times a day (BID) | ORAL | 0 refills | Status: DC | PRN

## 2016-07-10 NOTE — Progress Notes (Signed)
   BP 137/81   Pulse 89   Temp 98 F (36.7 C)   Wt 263 lb (119.3 kg)   SpO2 98%   BMI 39.18 kg/m    Subjective:    Patient ID: Ernest [name], male    DOB: 12/29/1950, 65 y.o.   MRN: [id number]  HPI: Ernest [name] is a 65 y.o. male  Chief Complaint  Patient presents with  . Cough    Started yesterday, chest congestion, some head congestion and sore throat to start with, coughing up some blood. No fever.     Patient c/o 2 weeks of productive cough, fatigue, wheezing, SOB, chest soreness. Has been coughing up blood since yesterday - started as brown sputum and now is frank red blood. Unsure if he has run a fever. Has been on vacation with grandkids over the past few weeks and some of them had been sick. Taking nyquil to help him sleep at night.   Relevant past medical, surgical, family and social history reviewed and updated as indicated. Interim medical history since our last visit reviewed. Allergies and medications reviewed and updated.  Review of Systems  Constitutional: Positive for fatigue.  HENT: Positive for sore throat.   Eyes: Negative.   Respiratory: Positive for cough, chest tightness, shortness of breath and wheezing.   Cardiovascular: Positive for chest pain (soreness).  Gastrointestinal: Negative for nausea and vomiting.  Genitourinary: Negative.   Musculoskeletal: Negative.   Neurological: Negative.   Psychiatric/Behavioral: Negative.     Per HPI unless specifically indicated above     Objective:    BP 137/81   Pulse 89   Temp 98 F (36.7 C)   Wt 263 lb (119.3 kg)   SpO2 98%   BMI 39.18 kg/m   Wt Readings from Last 3 Encounters:  07/10/16 263 lb (119.3 kg)  06/08/16 263 lb (119.3 kg)  02/23/16 252 lb (114.3 kg)    Physical Exam  Constitutional: He is oriented to person, place, and time. He appears well-developed and well-nourished.  HENT:  Head: Atraumatic.  B/l TMs intact, non-erythematous Oropharynx erythematous without exudate   Neck: Normal range of motion. Neck supple.  Cardiovascular: Normal rate and normal heart sounds.   Pulmonary/Chest: Effort normal. He has rales (RLL).  Abdominal: Soft. Bowel sounds are normal.  Musculoskeletal: Normal range of motion.  Lymphadenopathy:    He has no cervical adenopathy.  Neurological: He is alert and oriented to person, place, and time.  Skin: Skin is warm and dry.  Psychiatric: He has a normal mood and affect. His behavior is normal.  Nursing note and vitals reviewed.       Assessment & Plan:   Problem List Items Addressed This Visit    None    Visit Diagnoses    Right lower lobe pneumonia    -  Primary   Relevant Medications   azithromycin (ZITHROMAX) 250 MG tablet   benzonatate (TESSALON) 100 MG capsule   albuterol (PROVENTIL HFA;VENTOLIN HFA) 108 (90 Base) MCG/ACT inhaler   chlorpheniramine-HYDROcodone (TUSSIONEX PENNKINETIC ER) 10-8 MG/5ML SUER     Discussed utility of CXR as I will treat him regardless given duration and severity of symptoms. Azithromycin sent, along with symptomatic treatments for cough and SOB. Discussed careful use of tussionex and not driving while taking. Patient to f/u in the next 3 days if no improvement for further investigation.    Follow up plan: No Follow-up on file.

## 2016-07-10 NOTE — Patient Instructions (Signed)
Follow up if no improvement in the next few days.

## 2016-07-11 ENCOUNTER — Telehealth: Payer: Self-pay | Admitting: Family Medicine

## 2016-07-11 NOTE — Telephone Encounter (Signed)
Pt called stating he had forgotten to get a work note yesterday.  I can type it up if you give me the days you'd like him out of work.

## 2016-07-12 ENCOUNTER — Ambulatory Visit
Admission: RE | Admit: 2016-07-12 | Discharge: 2016-07-12 | Disposition: A | Discharge status: Home / Self Care | Payer: Medicare Other | Source: Ambulatory Visit | Attending: Family Medicine | Admitting: Family Medicine

## 2016-07-12 ENCOUNTER — Telehealth: Payer: Self-pay | Admitting: Family Medicine

## 2016-07-12 ENCOUNTER — Other Ambulatory Visit: Payer: Self-pay | Admitting: Family Medicine

## 2016-07-12 ENCOUNTER — Encounter: Payer: Self-pay | Admitting: Family Medicine

## 2016-07-12 DIAGNOSIS — R042 Hemoptysis: Secondary | ICD-10-CM

## 2016-07-12 DIAGNOSIS — R05 Cough: Secondary | ICD-10-CM | POA: Diagnosis not present

## 2016-07-12 DIAGNOSIS — I517 Cardiomegaly: Secondary | ICD-10-CM | POA: Diagnosis not present

## 2016-07-12 DIAGNOSIS — R0602 Shortness of breath: Secondary | ICD-10-CM | POA: Diagnosis not present

## 2016-07-12 NOTE — Telephone Encounter (Signed)
Patient notified. He states he is feeling much better today.

## 2016-07-12 NOTE — Telephone Encounter (Signed)
Please let patient know that his chest x-ray showed bronchitis, no pneumonia. Continue plan as discussed and follow up if no improvement. Thanks

## 2016-07-12 NOTE — Telephone Encounter (Signed)
Ernest [name] notified patient to go have the chest xray done. He will go this afternoon.

## 2016-07-12 NOTE — Telephone Encounter (Signed)
Please call pt and ask him to go for a CXR sometime today if possible.

## 2016-07-12 NOTE — Telephone Encounter (Signed)
I had told him to at least refrain from working yesterday, if not longer depending on his response to medication. I'm ok with whatever he decided on that - thanks!

## 2016-08-16 DIAGNOSIS — E119 Type 2 diabetes mellitus without complications: Secondary | ICD-10-CM | POA: Diagnosis not present

## 2016-08-16 LAB — HM DIABETES EYE EXAM

## 2016-09-20 ENCOUNTER — Encounter: Payer: Self-pay | Admitting: Family Medicine

## 2016-09-20 ENCOUNTER — Ambulatory Visit (INDEPENDENT_AMBULATORY_CARE_PROVIDER_SITE_OTHER): Payer: Medicare Other | Admitting: Family Medicine

## 2016-09-20 VITALS — BP 119/80 | HR 109 | Temp 98.0°F | Ht 69.5 in | Wt 268.8 lb

## 2016-09-20 DIAGNOSIS — I10 Essential (primary) hypertension: Secondary | ICD-10-CM | POA: Diagnosis not present

## 2016-09-20 DIAGNOSIS — Z23 Encounter for immunization: Secondary | ICD-10-CM

## 2016-09-20 DIAGNOSIS — I129 Hypertensive chronic kidney disease with stage 1 through stage 4 chronic kidney disease, or unspecified chronic kidney disease: Secondary | ICD-10-CM

## 2016-09-20 DIAGNOSIS — Z6839 Body mass index (BMI) 39.0-39.9, adult: Secondary | ICD-10-CM | POA: Diagnosis not present

## 2016-09-20 DIAGNOSIS — I482 Chronic atrial fibrillation, unspecified: Secondary | ICD-10-CM

## 2016-09-20 DIAGNOSIS — E1122 Type 2 diabetes mellitus with diabetic chronic kidney disease: Secondary | ICD-10-CM | POA: Diagnosis not present

## 2016-09-20 DIAGNOSIS — N183 Chronic kidney disease, stage 3 unspecified: Secondary | ICD-10-CM

## 2016-09-20 LAB — BAYER DCA HB A1C WAIVED: HB A1C (BAYER DCA - WAIVED): 6.3 % (ref <7.0)

## 2016-09-20 LAB — MICROALBUMIN, URINE WAIVED
Creatinine, Urine Waived: 100 mg/dL (ref 10–300)
Microalb, Ur Waived: 80 mg/L — ABNORMAL HIGH (ref 0–19)

## 2016-09-20 NOTE — Assessment & Plan Note (Addendum)
discuss diet exercise weight loss nutrition

## 2016-09-20 NOTE — Assessment & Plan Note (Signed)
The current medical regimen is effective;  continue present plan and medications.  

## 2016-09-20 NOTE — Progress Notes (Signed)
BP 119/80 (BP Location: Left Arm, Patient Position: Sitting, Cuff Size: Normal)   Pulse (!) 109   Temp 98 F (36.7 C)   Ht 5' 9.5" (1.765 m)   Wt 268 lb 12.8 oz (121.9 kg)   SpO2 95%   BMI 39.13 kg/m    Subjective:    Patient ID: Ernest [name], male    DOB: [birth year], 65 y.o.   MRN: [id number]  HPI: Ernest [name] is a 65 y.o. male  Chief Complaint  Patient presents with  . Follow-up  . Diabetes  . Hypertension   Patient follow-up doing well no complaints taking blood thinner without any issues no bleeding bruising concerns or problems. Blood sugar doing well no complaints or issues with medications noted low blood sugar spells Blood pressure good control no problems with medications again Relevant past medical, surgical, family and social history reviewed and updated as indicated. Interim medical history since our last visit reviewed. Allergies and medications reviewed and updated.  Review of Systems  Constitutional: Negative.   Respiratory: Negative.   Cardiovascular: Negative.     Per HPI unless specifically indicated above     Objective:    BP 119/80 (BP Location: Left Arm, Patient Position: Sitting, Cuff Size: Normal)   Pulse (!) 109   Temp 98 F (36.7 C)   Ht 5' 9.5" (1.765 m)   Wt 268 lb 12.8 oz (121.9 kg)   SpO2 95%   BMI 39.13 kg/m   Wt Readings from Last 3 Encounters:  09/20/16 268 lb 12.8 oz (121.9 kg)  07/10/16 263 lb (119.3 kg)  06/08/16 263 lb (119.3 kg)    Physical Exam  Constitutional: He is oriented to person, place, and time. He appears well-developed and well-nourished. No distress.  HENT:  Head: Normocephalic and atraumatic.  Right Ear: Hearing normal.  Left Ear: Hearing normal.  Nose: Nose normal.  Eyes: Conjunctivae and lids are normal. Right eye exhibits no discharge. Left eye exhibits no discharge. No scleral icterus.  Cardiovascular: Normal heart sounds.   Pulmonary/Chest: Effort normal and breath sounds normal. No  respiratory distress.  Musculoskeletal: Normal range of motion.  Neurological: He is alert and oriented to person, place, and time.  Skin: Skin is intact. No rash noted.  Psychiatric: He has a normal mood and affect. His speech is normal and behavior is normal. Judgment and thought content normal. Cognition and memory are normal.    Results for orders placed or performed in visit on 08/17/16  HM DIABETES EYE EXAM  Result Value Ref Range   HM Diabetic Eye Exam No Retinopathy No Retinopathy      Assessment & Plan:   Problem List Items Addressed This Visit      Cardiovascular and Mediastinum   Atrial fibrillation ([place])    The current medical regimen is effective;  continue present plan and medications.       Relevant Medications   atorvastatin (LIPITOR) 40 MG tablet   Essential hypertension    The current medical regimen is effective;  continue present plan and medications.       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       Relevant Medications   atorvastatin (LIPITOR) 40 MG tablet     Other   BMI 39.0-39.9,adult    discuss diet exercise weight loss  nutrition       Other Visit Diagnoses    Diabetes mellitus with stage 3 chronic kidney disease ([place])    -  Primary   Relevant Medications   atorvastatin (LIPITOR) 40 MG tablet   Other Relevant Orders   Bayer DCA Hb A1c Waived   Microalbumin, Urine Waived   Need for influenza vaccination       Relevant Orders   Flu vaccine HIGH DOSE PF (Fluzone High dose) (Completed)       Follow up plan: Return in about 3 months (around 12/21/2016) for Physical Exam, Hemoglobin A1c.

## 2016-09-20 NOTE — Patient Instructions (Signed)

## 2016-09-22 ENCOUNTER — Other Ambulatory Visit: Payer: Self-pay | Admitting: Family Medicine

## 2016-09-22 NOTE — Telephone Encounter (Signed)
Pt came by stated pharmacy has been sending requests for refills on his Xarelto. Pt needs this ASAP. Took his last one this morning. [facility] [place]. [employer]

## 2016-09-22 NOTE — Telephone Encounter (Signed)
Routing to provider  

## 2016-09-26 ENCOUNTER — Other Ambulatory Visit: Payer: Self-pay

## 2016-09-26 DIAGNOSIS — I129 Hypertensive chronic kidney disease with stage 1 through stage 4 chronic kidney disease, or unspecified chronic kidney disease: Secondary | ICD-10-CM

## 2016-09-26 DIAGNOSIS — E1122 Type 2 diabetes mellitus with diabetic chronic kidney disease: Secondary | ICD-10-CM

## 2016-09-26 DIAGNOSIS — I482 Chronic atrial fibrillation, unspecified: Secondary | ICD-10-CM

## 2016-09-26 DIAGNOSIS — N183 Chronic kidney disease, stage 3 (moderate): Secondary | ICD-10-CM

## 2016-09-26 DIAGNOSIS — I10 Essential (primary) hypertension: Secondary | ICD-10-CM

## 2016-09-26 DIAGNOSIS — G2581 Restless legs syndrome: Secondary | ICD-10-CM

## 2016-09-26 MED ORDER — SITAGLIPTIN PHOSPHATE 100 MG PO TABS: 100.0000 mg | ORAL_TABLET | Freq: Every day | ORAL | 4 refills | Status: DC

## 2016-09-26 MED ORDER — METFORMIN HCL 500 MG PO TABS: 1000.0000 mg | ORAL_TABLET | Freq: Two times a day (BID) | ORAL | 4 refills | Status: DC

## 2016-09-26 MED ORDER — AMLODIPINE BESYLATE 10 MG PO TABS: 10.0000 mg | ORAL_TABLET | Freq: Every day | ORAL | 4 refills | Status: DC

## 2016-09-26 MED ORDER — ATORVASTATIN CALCIUM 40 MG PO TABS: 40.0000 mg | ORAL_TABLET | Freq: Every day | ORAL | 4 refills | Status: DC

## 2016-09-26 MED ORDER — ISOSORBIDE MONONITRATE ER 60 MG PO TB24: 60.0000 mg | ORAL_TABLET | Freq: Every day | ORAL | 4 refills | Status: DC

## 2016-09-26 MED ORDER — GABAPENTIN 600 MG PO TABS: 1200.0000 mg | ORAL_TABLET | Freq: Every day | ORAL | 4 refills | Status: DC

## 2016-09-26 MED ORDER — GLIPIZIDE 5 MG PO TABS: 5.0000 mg | ORAL_TABLET | Freq: Every day | ORAL | 4 refills | Status: DC

## 2016-09-26 MED ORDER — RAMIPRIL 5 MG PO CAPS: 5.0000 mg | ORAL_CAPSULE | Freq: Every day | ORAL | 4 refills | Status: DC

## 2016-09-26 NOTE — Telephone Encounter (Signed)
Medication refill. Multiple.  Last date written 11/2016 [facility].

## 2016-10-18 ENCOUNTER — Ambulatory Visit (INDEPENDENT_AMBULATORY_CARE_PROVIDER_SITE_OTHER): Payer: Medicare Other | Admitting: Family Medicine

## 2016-10-18 ENCOUNTER — Encounter: Payer: Self-pay | Admitting: Family Medicine

## 2016-10-18 VITALS — BP 145/84 | HR 102 | Temp 98.4°F | Wt 269.0 lb

## 2016-10-18 DIAGNOSIS — J40 Bronchitis, not specified as acute or chronic: Secondary | ICD-10-CM | POA: Diagnosis not present

## 2016-10-18 MED ORDER — BENZONATATE 100 MG PO CAPS: 200.0000 mg | ORAL_CAPSULE | Freq: Three times a day (TID) | ORAL | 0 refills | Status: DC | PRN

## 2016-10-18 MED ORDER — ALBUTEROL SULFATE HFA 108 (90 BASE) MCG/ACT IN AERS: 2.0000 | INHALATION_SPRAY | Freq: Four times a day (QID) | RESPIRATORY_TRACT | 3 refills | Status: DC | PRN

## 2016-10-18 MED ORDER — HYDROCOD POLST-CPM POLST ER 10-8 MG/5ML PO SUER: 5.0000 mL | Freq: Two times a day (BID) | ORAL | 0 refills | Status: DC | PRN

## 2016-10-18 MED ORDER — AZITHROMYCIN 250 MG PO TABS: ORAL_TABLET | ORAL | 0 refills | Status: DC

## 2016-10-18 NOTE — Progress Notes (Signed)
BP (!) 145/84   Pulse (!) 102   Temp 98.4 F (36.9 C)   Wt 269 lb (122 kg)   SpO2 95%   BMI 39.15 kg/m    Subjective:    Patient ID: Ernest [name], male    DOB: Jan 01, 1951, 65 y.o.   MRN: [id number]  HPI: Ernest [name] is a 65 y.o. male  Chief Complaint  Patient presents with  . Cough    x 2 weeks, but yesterday started with productive cough with some blood, chest congestion, some head congestion, some sore throat. Felt like he did have some fever in the beginning.    Patient presents with 2 week history of productive cough, fever/chills, congestion, and sore throat. Fevers were mostly in the first few days. Now is mostly just having severe, persistent coughing, SOB, chest tightness, and red tinged sputum. Ran out of his albuterol. Has not been taking anything OTC. Very similar scenario occurred back in July. CXR showed bronchitis, treated with z-pak, cough medicines, and albuterol with good relief. Does have a hx of asthma.   Past Medical History:  Diagnosis Date  . Asthma   . Diabetes mellitus    Type II  . GERD (gastroesophageal reflux disease)   . Hyperlipidemia    Social History   Social History  . Marital status: Widowed    Spouse name: N/A  . Number of children: N/A  . Years of education: N/A   Occupational History  . Full time    Social History Main Topics  . Smoking status: Former Smoker    Packs/day: 4.00    Years: 28.00    Types: Cigarettes    Quit date: 08/25/1993  . Smokeless tobacco: Never Used  . Alcohol use No  . Drug use: No  . Sexual activity: Not on file   Other Topics Concern  . Not on file   Social History Narrative   No regular exercise.    Relevant past medical, surgical, family and social history reviewed and updated as indicated. Interim medical history since our last visit reviewed. Allergies and medications reviewed and updated.  Review of Systems  Constitutional: Positive for chills and fever.  HENT: Positive for  congestion, postnasal drip and sore throat.   Eyes: Negative.   Respiratory: Positive for cough, chest tightness, shortness of breath and wheezing.   Gastrointestinal: Negative.   Genitourinary: Negative.   Musculoskeletal: Negative.   Skin: Negative.   Neurological: Negative.   Psychiatric/Behavioral: Negative.     Per HPI unless specifically indicated above     Objective:    BP (!) 145/84   Pulse (!) 102   Temp 98.4 F (36.9 C)   Wt 269 lb (122 kg)   SpO2 95%   BMI 39.15 kg/m   Wt Readings from Last 3 Encounters:  10/18/16 269 lb (122 kg)  09/20/16 268 lb 12.8 oz (121.9 kg)  07/10/16 263 lb (119.3 kg)    Physical Exam  Constitutional: He is oriented to person, place, and time. He appears well-developed and well-nourished.  HENT:  Head: Atraumatic.  Right Ear: External ear normal.  Left Ear: External ear normal.  Nose: Nose normal.  Oropharynx erythematous  Eyes: Conjunctivae are normal. No scleral icterus.  Neck: Normal range of motion. Neck supple.  Pulmonary/Chest: Effort normal. He has wheezes (Mild expiratory wheezes diffusely).  Abdominal: Soft. Bowel sounds are normal.  Musculoskeletal: Normal range of motion.  Neurological: He is alert and oriented to person, place, and time.  Skin: Skin is warm and dry.  Psychiatric: He has a normal mood and affect. His behavior is normal.  Nursing note and vitals reviewed.      Assessment & Plan:   Problem List Items Addressed This Visit    None    Visit Diagnoses    Bronchitis    -  Primary   Given duration and severity, will treat with z-pak, tessalon, tussionex, and albuterol. Recommended rest, good hydration.     Suspect asthma exacerbation from severe URI. Follow up for CXR in about a week if no improvement.   Follow up plan: Return if symptoms worsen or fail to improve.

## 2016-10-18 NOTE — Patient Instructions (Signed)
Follow up as needed

## 2016-12-06 ENCOUNTER — Other Ambulatory Visit: Payer: Self-pay | Admitting: Family Medicine

## 2016-12-06 DIAGNOSIS — G2581 Restless legs syndrome: Secondary | ICD-10-CM

## 2016-12-06 DIAGNOSIS — E1122 Type 2 diabetes mellitus with diabetic chronic kidney disease: Secondary | ICD-10-CM

## 2016-12-06 DIAGNOSIS — N183 Chronic kidney disease, stage 3 (moderate): Secondary | ICD-10-CM

## 2016-12-06 DIAGNOSIS — E785 Hyperlipidemia, unspecified: Secondary | ICD-10-CM

## 2016-12-06 DIAGNOSIS — I482 Chronic atrial fibrillation, unspecified: Secondary | ICD-10-CM

## 2016-12-06 DIAGNOSIS — I10 Essential (primary) hypertension: Secondary | ICD-10-CM

## 2016-12-06 DIAGNOSIS — I129 Hypertensive chronic kidney disease with stage 1 through stage 4 chronic kidney disease, or unspecified chronic kidney disease: Secondary | ICD-10-CM

## 2016-12-06 NOTE — Telephone Encounter (Signed)
Tried calling patient.  Both of patient's numbers in his chart are not the right numbers.  The 512 and the 525. Other people answered explaining I had the wrong number.   The number he called from [phone], when I call it says "Welcome to [facility]."

## 2016-12-06 NOTE — Telephone Encounter (Signed)
Patient called to see if we had received the refill requests from [facility]. He states he is completely out of his medications.  Thank You  Ernest [name]

## 2016-12-07 ENCOUNTER — Other Ambulatory Visit: Payer: Self-pay | Admitting: Family Medicine

## 2016-12-07 DIAGNOSIS — E785 Hyperlipidemia, unspecified: Secondary | ICD-10-CM

## 2016-12-07 DIAGNOSIS — G2581 Restless legs syndrome: Secondary | ICD-10-CM

## 2016-12-07 DIAGNOSIS — N183 Chronic kidney disease, stage 3 (moderate): Principal | ICD-10-CM

## 2016-12-07 DIAGNOSIS — E1122 Type 2 diabetes mellitus with diabetic chronic kidney disease: Secondary | ICD-10-CM

## 2016-12-07 DIAGNOSIS — I10 Essential (primary) hypertension: Secondary | ICD-10-CM

## 2016-12-07 DIAGNOSIS — I129 Hypertensive chronic kidney disease with stage 1 through stage 4 chronic kidney disease, or unspecified chronic kidney disease: Secondary | ICD-10-CM

## 2016-12-07 DIAGNOSIS — I482 Chronic atrial fibrillation, unspecified: Secondary | ICD-10-CM

## 2016-12-07 NOTE — Telephone Encounter (Deleted)
Dr. Jeananne Rama,   On our end you gave the patient a 1 year supply.  But in their system it said patient has no further refills.

## 2016-12-26 ENCOUNTER — Telehealth: Payer: Self-pay | Admitting: Family Medicine

## 2016-12-26 NOTE — Telephone Encounter (Signed)
Patient was given a prescription for Ernest [name] and out of pocket expense was 290.00 Patient is unable to purchase at this time. Please advise alternative resources such as  new prescription until patient is able to purchase on 01/03/2017.  Thank You  [name]

## 2016-12-26 NOTE — Telephone Encounter (Signed)
Routing to provider  

## 2016-12-26 NOTE — Telephone Encounter (Signed)
Rx for eliquis to take BID for the week until he is able to pick up his Rx

## 2017-01-09 ENCOUNTER — Encounter: Payer: Medicare Other | Admitting: Family Medicine

## 2017-01-30 ENCOUNTER — Ambulatory Visit (INDEPENDENT_AMBULATORY_CARE_PROVIDER_SITE_OTHER): Payer: Medicare Other | Admitting: Family Medicine

## 2017-01-30 ENCOUNTER — Encounter: Payer: Self-pay | Admitting: Family Medicine

## 2017-01-30 VITALS — BP 138/78 | HR 85 | Ht 70.08 in | Wt 276.0 lb

## 2017-01-30 DIAGNOSIS — N401 Enlarged prostate with lower urinary tract symptoms: Secondary | ICD-10-CM

## 2017-01-30 DIAGNOSIS — Z125 Encounter for screening for malignant neoplasm of prostate: Secondary | ICD-10-CM | POA: Diagnosis not present

## 2017-01-30 DIAGNOSIS — Z0000 Encounter for general adult medical examination without abnormal findings: Secondary | ICD-10-CM | POA: Diagnosis not present

## 2017-01-30 DIAGNOSIS — I129 Hypertensive chronic kidney disease with stage 1 through stage 4 chronic kidney disease, or unspecified chronic kidney disease: Secondary | ICD-10-CM | POA: Diagnosis not present

## 2017-01-30 DIAGNOSIS — Z1329 Encounter for screening for other suspected endocrine disorder: Secondary | ICD-10-CM | POA: Diagnosis not present

## 2017-01-30 DIAGNOSIS — E785 Hyperlipidemia, unspecified: Secondary | ICD-10-CM

## 2017-01-30 DIAGNOSIS — G4733 Obstructive sleep apnea (adult) (pediatric): Secondary | ICD-10-CM

## 2017-01-30 DIAGNOSIS — I10 Essential (primary) hypertension: Secondary | ICD-10-CM

## 2017-01-30 DIAGNOSIS — G2581 Restless legs syndrome: Secondary | ICD-10-CM | POA: Diagnosis not present

## 2017-01-30 DIAGNOSIS — N183 Chronic kidney disease, stage 3 unspecified: Secondary | ICD-10-CM

## 2017-01-30 DIAGNOSIS — Z9989 Dependence on other enabling machines and devices: Secondary | ICD-10-CM | POA: Diagnosis not present

## 2017-01-30 DIAGNOSIS — R042 Hemoptysis: Secondary | ICD-10-CM | POA: Insufficient documentation

## 2017-01-30 DIAGNOSIS — E1122 Type 2 diabetes mellitus with diabetic chronic kidney disease: Secondary | ICD-10-CM

## 2017-01-30 DIAGNOSIS — I482 Chronic atrial fibrillation, unspecified: Secondary | ICD-10-CM

## 2017-01-30 MED ORDER — SITAGLIPTIN PHOSPHATE 100 MG PO TABS: 100.0000 mg | ORAL_TABLET | Freq: Every day | ORAL | 4 refills | Status: DC

## 2017-01-30 MED ORDER — ATORVASTATIN CALCIUM 40 MG PO TABS: 40.0000 mg | ORAL_TABLET | Freq: Every day | ORAL | 4 refills | Status: DC

## 2017-01-30 MED ORDER — FUROSEMIDE 40 MG PO TABS: 20.0000 mg | ORAL_TABLET | Freq: Two times a day (BID) | ORAL | 4 refills | Status: DC

## 2017-01-30 MED ORDER — ISOSORBIDE MONONITRATE ER 60 MG PO TB24: 60.0000 mg | ORAL_TABLET | Freq: Every day | ORAL | 4 refills | Status: DC

## 2017-01-30 MED ORDER — RAMIPRIL 5 MG PO CAPS: 5.0000 mg | ORAL_CAPSULE | Freq: Every day | ORAL | 4 refills | Status: DC

## 2017-01-30 MED ORDER — METFORMIN HCL 500 MG PO TABS: 1000.0000 mg | ORAL_TABLET | Freq: Two times a day (BID) | ORAL | 4 refills | Status: DC

## 2017-01-30 MED ORDER — GABAPENTIN 600 MG PO TABS: 1200.0000 mg | ORAL_TABLET | Freq: Every day | ORAL | 4 refills | Status: DC

## 2017-01-30 MED ORDER — METOPROLOL TARTRATE 50 MG PO TABS: 50.0000 mg | ORAL_TABLET | Freq: Two times a day (BID) | ORAL | 4 refills | Status: DC

## 2017-01-30 MED ORDER — AMLODIPINE BESYLATE 10 MG PO TABS: 10.0000 mg | ORAL_TABLET | Freq: Every day | ORAL | 4 refills | Status: DC

## 2017-01-30 MED ORDER — ONGLYZA 5 MG PO TABS: 5.0000 mg | ORAL_TABLET | Freq: Every day | ORAL | 4 refills | Status: DC

## 2017-01-30 MED ORDER — GLIPIZIDE 5 MG PO TABS: 5.0000 mg | ORAL_TABLET | Freq: Every day | ORAL | 4 refills | Status: DC

## 2017-01-30 MED ORDER — RIVAROXABAN 20 MG PO TABS: 20.0000 mg | ORAL_TABLET | Freq: Every day | ORAL | 7 refills | Status: DC

## 2017-01-30 NOTE — Assessment & Plan Note (Signed)
Not using

## 2017-01-30 NOTE — Assessment & Plan Note (Signed)
The current medical regimen is effective;  continue present plan and medications.  

## 2017-01-30 NOTE — Progress Notes (Signed)
BP 138/78 (BP Location: Left Arm)   Pulse 85   Ht 5' 10.08" (1.78 m)   Wt 276 lb (125.2 kg)   SpO2 97%   BMI 39.51 kg/m    Subjective:    Patient ID: Ernest [name], male    DOB: 1951-02-28, 66 y.o.   MRN: [id number]  HPI: Ernest [name] is a 66 y.o. male  Chief Complaint  Patient presents with  . Annual Exam   Patient with complaints of coughing and tickle in his throat with sometimes hard coughing and then coughing up a little bit of blood tinged sputum. This is been ongoing off and on since this summer. Maybe a little bit worse this winter with some mild URI symptoms. Patient is a nonsmoker. Quit smoking over 20 years ago. Reviewed chest x-ray from this summer which was normal. 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 Patient taking Xarelto faithfully. Which is taking for atrial fibrillation. Other medical problems diabetes noted low blood sugar spells no issues with medications. Blood pressure cholesterol also doing well with no issues. Gabapentin helps restless legs and some back discomfort.  Relevant past medical, surgical, family and social history reviewed and updated as indicated. Interim medical history since our last visit reviewed. Allergies and medications reviewed and updated.  Review of Systems  Constitutional: Negative.   HENT: Negative.   Eyes: Negative.   Respiratory: Negative.   Cardiovascular: Negative.   Gastrointestinal: Negative.   Endocrine: Negative.   Genitourinary: Negative.   Musculoskeletal: Negative.   Skin: Negative.   Allergic/Immunologic: Negative.   Neurological: Negative.   Hematological: Negative.   Psychiatric/Behavioral: Negative.     Per HPI unless specifically indicated above     Objective:    BP 138/78 (BP Location: Left Arm)   Pulse 85   Ht 5' 10.08" (1.78 m)   Wt 276 lb (125.2 kg)   SpO2 97%   BMI 39.51 kg/m   Wt Readings from Last 3 Encounters:  01/30/17 276 lb (125.2 kg)    10/18/16 269 lb (122 kg)  09/20/16 268 lb 12.8 oz (121.9 kg)    Physical Exam  Constitutional: He is oriented to person, place, and time. He appears well-developed and well-nourished.  HENT:  Head: Normocephalic and atraumatic.  Right Ear: External ear normal.  Left Ear: External ear normal.  Eyes: Conjunctivae and EOM are normal. Pupils are equal, round, and reactive to light.  Neck: Normal range of motion. Neck supple.  Cardiovascular: Normal rate, regular rhythm, normal heart sounds and intact distal pulses.   Pulmonary/Chest: Effort normal and breath sounds normal.  Abdominal: Soft. Bowel sounds are normal. There is no splenomegaly or hepatomegaly.  Genitourinary: Rectum normal and penis normal.  Genitourinary Comments: BPH changes  Musculoskeletal: Normal range of motion.  Neurological: He is alert and oriented to person, place, and time. He has normal reflexes.  Skin: No rash noted. No erythema.  Psychiatric: He has a normal mood and affect. His behavior is normal. Judgment and thought content normal.    Results for orders placed or performed in visit on 09/20/16  Bayer DCA Hb A1c Waived  Result Value Ref Range   Bayer DCA Hb A1c Waived 6.3 <7.0 %  Microalbumin, Urine Waived  Result Value Ref Range   Microalb, Ur Waived 80 (H) 0 - 19 mg/L   Creatinine, Urine Waived 100 10 - 300 mg/dL   Microalb/Creat Ratio 30-300 (H) <30 mg/g  Assessment & Plan:   Problem List Items Addressed This Visit      Cardiovascular and Mediastinum   Atrial fibrillation [facility])    The current medical regimen is effective;  continue present plan and medications.       Relevant Medications   amLODipine (NORVASC) 10 MG tablet   atorvastatin (LIPITOR) 40 MG tablet   furosemide (LASIX) 40 MG tablet   isosorbide mononitrate (IMDUR) 60 MG 24 hr tablet   metoprolol (LOPRESSOR) 50 MG tablet   ramipril (ALTACE) 5 MG capsule   rivaroxaban (XARELTO) 20 MG TABS tablet   Essential hypertension     The current medical regimen is effective;  continue present plan and medications.       Relevant Medications   amLODipine (NORVASC) 10 MG tablet   atorvastatin (LIPITOR) 40 MG tablet   furosemide (LASIX) 40 MG tablet   isosorbide mononitrate (IMDUR) 60 MG 24 hr tablet   metoprolol (LOPRESSOR) 50 MG tablet   ramipril (ALTACE) 5 MG capsule   rivaroxaban (XARELTO) 20 MG TABS tablet   Other Relevant Orders   Bayer DCA Hb A1c Waived   Comprehensive metabolic panel   CBC with Differential/Platelet   Lipid panel   Urinalysis, Routine w reflex microscopic   Type 2 DM with CKD stage 3 and hypertension ([facility])    The current medical regimen is effective;  continue present plan and medications.       Relevant Medications   amLODipine (NORVASC) 10 MG tablet   atorvastatin (LIPITOR) 40 MG tablet   furosemide (LASIX) 40 MG tablet   glipiZIDE (GLUCOTROL) 5 MG tablet   isosorbide mononitrate (IMDUR) 60 MG 24 hr tablet   metoprolol (LOPRESSOR) 50 MG tablet   metFORMIN (GLUCOPHAGE) 500 MG tablet   ONGLYZA 5 MG TABS tablet   ramipril (ALTACE) 5 MG capsule   sitaGLIPtin (JANUVIA) 100 MG tablet   rivaroxaban (XARELTO) 20 MG TABS tablet   Other Relevant Orders   Bayer DCA Hb A1c Waived   Comprehensive metabolic panel   CBC with Differential/Platelet   Lipid panel   Urinalysis, Routine w reflex microscopic     Respiratory   OSA on CPAP    Not using        Genitourinary   BPH (benign prostatic hyperplasia)     Other   Hyperlipidemia    The current medical regimen is effective;  continue present plan and medications.       Relevant Medications   amLODipine (NORVASC) 10 MG tablet   atorvastatin (LIPITOR) 40 MG tablet   furosemide (LASIX) 40 MG tablet   isosorbide mononitrate (IMDUR) 60 MG 24 hr tablet   metoprolol (LOPRESSOR) 50 MG tablet   ramipril (ALTACE) 5 MG capsule   rivaroxaban (XARELTO) 20 MG TABS tablet   Other Relevant Orders   Bayer DCA Hb A1c Waived   Comprehensive  metabolic panel   CBC with Differential/Platelet   Lipid panel   Urinalysis, Routine w reflex microscopic   Restless legs   Relevant Medications   gabapentin (NEURONTIN) 600 MG tablet   Cough with hemoptysis     because of chronic nature of hemoptysis not getting better with antibiotics remote history of smoking we will schedule for CT of lungs. Patient will continue Xarelto for now.      Relevant Orders   CT Chest W Contrast    Other Visit Diagnoses    Annual physical exam    -  Primary   Relevant Orders   Bayer  DCA Hb A1c Waived   Comprehensive metabolic panel   CBC with Differential/Platelet   Lipid panel   PSA   TSH   Urinalysis, Routine w reflex microscopic   Prostate cancer screening       Relevant Orders   PSA   Thyroid disorder screen       Relevant Orders   TSH       Follow up plan: Return for BMP,  Lipids, ALT, AST, Hemoglobin A1c.

## 2017-01-30 NOTE — Assessment & Plan Note (Addendum)
because of chronic nature of hemoptysis not getting better with antibiotics remote history of smoking we will schedule for CT of lungs. Patient will continue Xarelto for now.

## 2017-01-31 ENCOUNTER — Encounter: Payer: Self-pay | Admitting: Family Medicine

## 2017-01-31 LAB — CBC WITH DIFFERENTIAL/PLATELET
Basophils Absolute: 0 10*3/uL (ref 0.0–0.2)
Basos: 1 %
EOS (ABSOLUTE): 0.2 10*3/uL (ref 0.0–0.4)
Eos: 3 %
Hematocrit: 39.9 % (ref 37.5–51.0)
Hemoglobin: 13.4 g/dL (ref 13.0–17.7)
Immature Grans (Abs): 0 10*3/uL (ref 0.0–0.1)
Immature Granulocytes: 0 %
Lymphocytes Absolute: 4.3 10*3/uL — ABNORMAL HIGH (ref 0.7–3.1)
Lymphs: 50 %
MCH: 26.5 pg — ABNORMAL LOW (ref 26.6–33.0)
MCHC: 33.6 g/dL (ref 31.5–35.7)
MCV: 79 fL (ref 79–97)
Monocytes Absolute: 0.5 10*3/uL (ref 0.1–0.9)
Monocytes: 5 %
Neutrophils Absolute: 3.6 10*3/uL (ref 1.4–7.0)
Neutrophils: 41 %
Platelets: 205 10*3/uL (ref 150–379)
RBC: 5.06 x10E6/uL (ref 4.14–5.80)
RDW: 15.8 % — ABNORMAL HIGH (ref 12.3–15.4)
WBC: 8.6 10*3/uL (ref 3.4–10.8)

## 2017-01-31 LAB — URINALYSIS, ROUTINE W REFLEX MICROSCOPIC
Bilirubin, UA: NEGATIVE
Glucose, UA: NEGATIVE
Ketones, UA: NEGATIVE
Leukocytes, UA: NEGATIVE
Nitrite, UA: NEGATIVE
Protein, UA: NEGATIVE
Specific Gravity, UA: 1.01 (ref 1.005–1.030)
Urobilinogen, Ur: 0.2 mg/dL (ref 0.2–1.0)
pH, UA: 6 (ref 5.0–7.5)

## 2017-01-31 LAB — COMPREHENSIVE METABOLIC PANEL
ALT: 29 IU/L (ref 0–44)
AST: 32 IU/L (ref 0–40)
Albumin/Globulin Ratio: 1.7 (ref 1.2–2.2)
Albumin: 4.5 g/dL (ref 3.6–4.8)
Alkaline Phosphatase: 81 IU/L (ref 39–117)
BUN/Creatinine Ratio: 14 (ref 10–24)
BUN: 14 mg/dL (ref 8–27)
Bilirubin Total: 0.9 mg/dL (ref 0.0–1.2)
CO2: 25 mmol/L (ref 18–29)
Calcium: 9.8 mg/dL (ref 8.6–10.2)
Chloride: 100 mmol/L (ref 96–106)
Creatinine, Ser: 0.97 mg/dL (ref 0.76–1.27)
GFR calc Af Amer: 94 mL/min/{1.73_m2} (ref >59)
GFR calc non Af Amer: 81 mL/min/{1.73_m2} (ref >59)
Globulin, Total: 2.6 g/dL (ref 1.5–4.5)
Glucose: 147 mg/dL — ABNORMAL HIGH (ref 65–99)
Potassium: 4.1 mmol/L (ref 3.5–5.2)
Sodium: 143 mmol/L (ref 134–144)
Total Protein: 7.1 g/dL (ref 6.0–8.5)

## 2017-01-31 LAB — LIPID PANEL
Chol/HDL Ratio: 3.7 ratio units (ref 0.0–5.0)
Cholesterol, Total: 112 mg/dL (ref 100–199)
HDL: 30 mg/dL — ABNORMAL LOW (ref >39)
LDL Calculated: 44 mg/dL (ref 0–99)
Triglycerides: 191 mg/dL — ABNORMAL HIGH (ref 0–149)
VLDL Cholesterol Cal: 38 mg/dL (ref 5–40)

## 2017-01-31 LAB — TSH: TSH: 2.76 u[IU]/mL (ref 0.450–4.500)

## 2017-01-31 LAB — PSA: Prostate Specific Ag, Serum: 1 ng/mL (ref 0.0–4.0)

## 2017-01-31 LAB — BAYER DCA HB A1C WAIVED: HB A1C (BAYER DCA - WAIVED): 6.8 % (ref <7.0)

## 2017-02-07 ENCOUNTER — Other Ambulatory Visit: Payer: Self-pay | Admitting: Family Medicine

## 2017-02-07 ENCOUNTER — Ambulatory Visit
Admission: RE | Admit: 2017-02-07 | Discharge: 2017-02-07 | Disposition: A | Discharge status: Home / Self Care | Payer: Medicare Other | Source: Ambulatory Visit | Attending: Family Medicine | Admitting: Family Medicine

## 2017-02-07 DIAGNOSIS — K760 Fatty (change of) liver, not elsewhere classified: Secondary | ICD-10-CM | POA: Diagnosis not present

## 2017-02-07 DIAGNOSIS — R042 Hemoptysis: Secondary | ICD-10-CM | POA: Insufficient documentation

## 2017-02-07 DIAGNOSIS — I2510 Atherosclerotic heart disease of native coronary artery without angina pectoris: Secondary | ICD-10-CM | POA: Diagnosis not present

## 2017-02-07 DIAGNOSIS — K8020 Calculus of gallbladder without cholecystitis without obstruction: Secondary | ICD-10-CM | POA: Diagnosis not present

## 2017-02-07 DIAGNOSIS — I700 Atherosclerosis of aorta: Secondary | ICD-10-CM | POA: Diagnosis not present

## 2017-02-07 DIAGNOSIS — I517 Cardiomegaly: Secondary | ICD-10-CM | POA: Diagnosis not present

## 2017-02-07 DIAGNOSIS — R918 Other nonspecific abnormal finding of lung field: Secondary | ICD-10-CM | POA: Diagnosis not present

## 2017-02-07 DIAGNOSIS — I318 Other specified diseases of pericardium: Secondary | ICD-10-CM | POA: Insufficient documentation

## 2017-02-07 MED ORDER — IOPAMIDOL (ISOVUE-300) INJECTION 61%
75.0000 mL | Freq: Once | INTRAVENOUS | Status: AC | PRN
  Administered 2017-02-07: 75 mL via INTRAVENOUS

## 2017-02-07 NOTE — Progress Notes (Signed)
Phone call Discussed with patient results of CT not showing any cause of hemoptysis. Incidentally found was some pericardial calcium which may cause some restrictive heart disease. Discussed with patient Will refer to cardiology to further evaluate. If hemoptysis persists will consider pulmonary consult.

## 2017-02-07 NOTE — Assessment & Plan Note (Signed)
Shows up on lung CT scan February 2018 will refer to cardiology.

## 2017-02-13 ENCOUNTER — Encounter: Payer: Self-pay | Admitting: Internal Medicine

## 2017-02-13 ENCOUNTER — Ambulatory Visit (INDEPENDENT_AMBULATORY_CARE_PROVIDER_SITE_OTHER): Payer: Medicare Other | Admitting: Internal Medicine

## 2017-02-13 VITALS — BP 150/84 | HR 88 | Ht 70.0 in | Wt 272.5 lb

## 2017-02-13 DIAGNOSIS — I318 Other specified diseases of pericardium: Secondary | ICD-10-CM

## 2017-02-13 DIAGNOSIS — I482 Chronic atrial fibrillation: Secondary | ICD-10-CM | POA: Diagnosis not present

## 2017-02-13 DIAGNOSIS — R0789 Other chest pain: Secondary | ICD-10-CM

## 2017-02-13 DIAGNOSIS — I4821 Permanent atrial fibrillation: Secondary | ICD-10-CM

## 2017-02-13 DIAGNOSIS — I10 Essential (primary) hypertension: Secondary | ICD-10-CM | POA: Diagnosis not present

## 2017-02-13 MED ORDER — METOPROLOL TARTRATE 75 MG PO TABS: 1.0000 | ORAL_TABLET | Freq: Two times a day (BID) | ORAL | 3 refills | Status: DC

## 2017-02-13 NOTE — Progress Notes (Signed)
Follow-up Outpatient Visit Date: 02/13/2017  Primary Care Provider: Golden Pop, MD [address] [place] [address]  Chief Complaint: Abnormal chest CT  HPI:  Ernest [name] is a 66 y.o. year-old male with history of permanent atrial fibrillation, nonobstructive coronary artery disease, hypertension, hyperlipidemia, diabetes mellitus, chronic kidney disease stage III, and obstruct sleep apnea, who presents for evaluation of incidentally noted pericardial calcification on chest CT. He was previously followed in our office by Dr. Fletcher Anon, having last been seen in 11/2014. Over the last few years, [name] has noted low energy at times. This typically happens once or twice a day forces him to sit down and rest for a while. He is then able to continue with his usual activities. Overall, frequency and severity has not changed for over a year. He also notes occasional chest pain that he describes as a sharp sensation under the sternum. The last for a few seconds and then resolves. It is different than the tightness that he experienced at the time of his heart catheterization in 2015. His current pain can occur both with exertion or when resting, including lying down and at times is associated with brief palpitations.. Mr. [name] notes some exertional dyspnea and shortness of breath when bending over. He also has occasional orthostatic lightheadedness. He remains compliant with his medications, including rivaroxaban for permanent atrial fibrillation.  The patient denies history of chest surgery. He is. Chest wall trauma many years ago with several rib fractures. He denies a history of tuberculosis or other fungal infections. 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 He is able to carry out his usual activities without significant limitation.  --------------------------------------------------------------------------------------------------  Cardiovascular History &  Procedures: Cardiovascular Problems:  Pericardial calcification  Nonobstructive coronary artery disease  Permanent atrial fibrillation  Risk Factors:  Known coronary artery disease, hypertension, hyperlipidemia, diabetes mellitus, male gender, and age greater than 41  Cath/PCI:  LHC (07/27/14): Ostial LMCA with 30% stenosis. RCA with 20-30% proximal and mid vessel disease. No significant change from 2011. LVEF 60% with mildly elevated LVEDP.  CV Surgery:  None  EP Procedures and Devices:  None  Non-Invasive Evaluation(s):  TTE (07/25/14): Moderate to severely dilated left ventricle with LVEF 55-60%. Mild to moderate biatrial enlargement. Aortic sclerosis without stenosis. Mild TR.  Recent CV Pertinent Labs: Lab Results  Component Value Date   CHOL 112 01/30/2017   CHOL 120 06/08/2016   CHOL 104 07/25/2014   HDL 30 (L) 01/30/2017   HDL 25 (L) 07/25/2014   LDLCALC 44 01/30/2017   LDLCALC 30 07/25/2014   TRIG 191 (H) 01/30/2017   TRIG 253 (H) 06/08/2016   TRIG 243 (H) 07/25/2014   TRIG 249 05/27/2010   CHOLHDL 3.7 01/30/2017   INR 1.4 (H) 02/23/2016   INR 1.1 07/24/2014   BNP 604 (H) 07/24/2014   K 4.1 01/30/2017   K 3.7 07/25/2014   MG 1.6 (L) 07/25/2014   BUN 14 01/30/2017   BUN 16 07/25/2014   CREATININE 0.97 01/30/2017   CREATININE 1.21 07/25/2014    Past medical and surgical history were reviewed and updated in EPIC.  Outpatient Encounter Prescriptions as of 02/13/2017  Medication Sig  . albuterol (PROVENTIL HFA;VENTOLIN HFA) 108 (90 Base) MCG/ACT inhaler Inhale 2 puffs into the lungs every 6 (six) hours as needed for wheezing or shortness of breath.  Marland Kitchen amLODipine (NORVASC) 10 MG tablet Take 1 tablet (10 mg total) by mouth daily.  Marland Kitchen atorvastatin (LIPITOR)  40 MG tablet Take 1 tablet (40 mg total) by mouth daily at 6 PM.  . furosemide (LASIX) 40 MG tablet Take 0.5 tablets (20 mg total) by mouth 2 (two) times daily.  Marland Kitchen gabapentin (NEURONTIN) 600 MG tablet  Take 2 tablets (1,200 mg total) by mouth at bedtime.  Marland Kitchen glipiZIDE (GLUCOTROL) 5 MG tablet Take 1 tablet (5 mg total) by mouth daily before breakfast.  . isosorbide mononitrate (IMDUR) 60 MG 24 hr tablet Take 1 tablet (60 mg total) by mouth daily.  . metFORMIN (GLUCOPHAGE) 500 MG tablet Take 2 tablets (1,000 mg total) by mouth 2 (two) times daily.  . metoprolol (LOPRESSOR) 50 MG tablet Take 1 tablet (50 mg total) by mouth 2 (two) times daily.  Marland Kitchen omeprazole (PRILOSEC) 20 MG capsule Take 20 mg by mouth 2 (two) times daily before a meal.   . ONGLYZA 5 MG TABS tablet Take 1 tablet (5 mg total) by mouth daily.  . ramipril (ALTACE) 5 MG capsule Take 1 capsule (5 mg total) by mouth daily.  . rivaroxaban (XARELTO) 20 MG TABS tablet Take 1 tablet (20 mg total) by mouth daily with supper.  . sitaGLIPtin (JANUVIA) 100 MG tablet Take 1 tablet (100 mg total) by mouth daily.   No facility-administered encounter medications on file as of 02/13/2017.     Allergies: Patient has no known allergies.  Social History   Social History  . Marital status: Widowed    Spouse name: N/A  . Number of children: N/A  . Years of education: N/A   Occupational History  . Full time    Social History Main Topics  . Smoking status: Former Smoker    Packs/day: 4.00    Years: 28.00    Types: Cigarettes    Quit date: 08/25/1993  . Smokeless tobacco: Never Used  . Alcohol use No  . Drug use: No  . Sexual activity: Not on file   Other Topics Concern  . Not on file   Social History Narrative   No regular exercise.    Family History  Problem Relation Age of Onset  . Hypertension Mother   . Hyperlipidemia Mother   . Diabetes Mother   . Heart disease Mother     CABG  . Pneumonia Father     rare type  . Diabetes Other   . Depression Other   . Coronary artery disease Other   . Alcohol abuse Other   . Hypertension Other   . Hyperlipidemia Other   . Cancer Brother   . Kidney Stones Brother     Review of  Systems: Patient reports occasional vesicles that he pops along the dorsum of his left foot. He denies claudication. Otherwise, a 12-system review of systems was performed and was negative except as noted in the HPI.  --------------------------------------------------------------------------------------------------  Physical Exam: BP (!) 150/84 (BP Location: Right Arm, Patient Position: Sitting, Cuff Size: Normal)   Pulse 88   Ht 5\' 10"  (1.778 m)   Wt 272 lb 8 oz (123.6 kg)   BMI 39.10 kg/m   General:  Obese man, seated comfortably in the exam room. HEENT: No conjunctival pallor or scleral icterus.  Moist mucous membranes.  OP clear. Neck: Supple without lymphadenopathy, thyromegaly, JVD, or HJR.  No carotid bruit. Lungs: Normal work of breathing.  Clear to auscultation bilaterally without wheezes or crackles. Heart: Irregularly irregular rhythm without murmurs, rubs, or gallops.  Non-displaced PMI. Abd: Bowel sounds present.  Soft, NT/ND without hepatosplenomegaly Ext: No lower  extremity edema.  Radial, PT, and DP pulses are 2+ bilaterally. Skin: warm and dry without rash  EKG:  Atrial fibrillation (ventricular rate 88 bpm) with nonspecific ST/T changes. No significant change from prior tracing on 11/27/14 (I have personally reviewed both tracings).  Lab Results  Component Value Date   WBC 8.6 01/30/2017   HGB 14.4 07/27/2014   HCT 39.9 01/30/2017   MCV 79 01/30/2017   PLT 205 01/30/2017    Lab Results  Component Value Date   NA 143 01/30/2017   K 4.1 01/30/2017   CL 100 01/30/2017   CO2 25 01/30/2017   BUN 14 01/30/2017   CREATININE 0.97 01/30/2017   GLUCOSE 147 (H) 01/30/2017   ALT 29 01/30/2017    Lab Results  Component Value Date   CHOL 112 01/30/2017   HDL 30 (L) 01/30/2017   LDLCALC 44 01/30/2017   TRIG 191 (H) 01/30/2017   CHOLHDL 3.7 01/30/2017     --------------------------------------------------------------------------------------------------  ASSESSMENT AND PLAN: Pericardial calcification Findings are nonspecific and could be seen with prior pericarditis, including infectious cause, chest wall trauma leading to hemopericardium, or idiopathic etiology. The patient notes some fatigue but does not have clinical evidence of constrictive physiology (no lower extremity edema or JVP with Kussmaul sign). I personally reviewed his chest CT and noted the calcification to be quite focal along the anterior pericardium behind the sternum. The pericardium otherwise does not appear significantly thickened. There is no pericardial effusion on the CT. We have agreed to obtain a transthoracic echocardiogram for further evaluation, including evidence of restrictive/constrictive physiology.  Permanent atrial fibrillation Patient remains in atrial fibrillation with adequate rate control. He is tolerating anticoagulation with rivaroxaban well. Heart rate is reasonably well controlled, though we certainly have some room for escalation of rate control. Given his blood pressure is elevated today, we will increase metoprolol tartrate to 75 mg twice a day.  Atypical chest pain The patient reports intermittent brief episodes of sharp pain with accompanying palpitations. I question if he is having brief episodes of rapid ventricular response that are uncomfortable. He does, notably, have nonobstructive CAD noted by catheterization in 2015. We will proceed with echocardiogram first. If there is no evidence of constriction, we can consider myocardial perfusion stress test. If possible constriction is noted, we may instead proceed with left and right heart catheterization to better define his coronary anatomy and hemodynamics.  Hypertension Blood pressure is suboptimally controlled. We will increase metoprolol tartrate to 75 mg twice a day.  Follow-up: Return to  clinic in 6 weeks.  Nelva Bush, MD 02/14/2017 7:48 PM

## 2017-02-13 NOTE — Patient Instructions (Addendum)
Medication Instructions:  Your physician has recommended you make the following change in your medication:  1- INCREASE Metoprolol to 75 mg by mouth 2 times a day.   Labwork: none  Testing/Procedures: Your physician has requested that you have an echocardiogram. Echocardiography is a painless test that uses sound waves to create images of your heart. It provides your doctor with information about the size and shape of your heart and how well your heart's chambers and valves are working. This procedure takes approximately one hour. There are no restrictions for this procedure.    Follow-Up: Your physician recommends that you schedule a follow-up appointment in: [place].   If you need a refill on your cardiac medications before your next appointment, please call your pharmacy.   Echocardiogram An echocardiogram, or echocardiography, uses sound waves (ultrasound) to produce an image of your heart. The echocardiogram is simple, painless, obtained within a short period of time, and offers valuable information to your health care provider. The images from an echocardiogram can provide information such as:  Evidence of coronary artery disease (CAD).  Heart size.  Heart muscle function.  Heart valve function.  Aneurysm detection.  Evidence of a past heart attack.  Fluid buildup around the heart.  Heart muscle thickening.  Assess heart valve function. Tell a health care provider about:  Any allergies you have.  All medicines you are taking, including vitamins, herbs, eye drops, creams, and over-the-counter medicines.  Any problems you or family members have had with anesthetic medicines.  Any blood disorders you have.  Any surgeries you have had.  Any medical conditions you have.  Whether you are pregnant or may be pregnant. What happens before the procedure? No special preparation is needed. Eat and drink normally. What happens during the procedure?  In  order to produce an image of your heart, gel will be applied to your chest and a wand-like tool (transducer) will be moved over your chest. The gel will help transmit the sound waves from the transducer. The sound waves will harmlessly bounce off your heart to allow the heart images to be captured in real-time motion. These images will then be recorded.  You may need an IV to receive a medicine that improves the quality of the pictures. What happens after the procedure? You may return to your normal schedule including diet, activities, and medicines, unless your health care provider tells you otherwise. This information is not intended to replace advice given to you by your health care provider. Make sure you discuss any questions you have with your health care provider. Document Released: 12/01/2000 Document Revised: 07/22/2016 Document Reviewed: 08/11/2013 Elsevier Interactive Patient Education  2017 [employer].

## 2017-03-12 ENCOUNTER — Other Ambulatory Visit: Payer: Self-pay

## 2017-03-12 ENCOUNTER — Ambulatory Visit (INDEPENDENT_AMBULATORY_CARE_PROVIDER_SITE_OTHER): Payer: Medicare Other

## 2017-03-12 DIAGNOSIS — I318 Other specified diseases of pericardium: Secondary | ICD-10-CM | POA: Diagnosis not present

## 2017-03-28 ENCOUNTER — Encounter: Payer: Self-pay | Admitting: Cardiovascular Disease

## 2017-03-28 ENCOUNTER — Ambulatory Visit (INDEPENDENT_AMBULATORY_CARE_PROVIDER_SITE_OTHER): Payer: Medicare Other | Admitting: Cardiovascular Disease

## 2017-03-28 VITALS — BP 128/68 | HR 76 | Ht 70.0 in | Wt 275.8 lb

## 2017-03-28 DIAGNOSIS — I4821 Permanent atrial fibrillation: Secondary | ICD-10-CM

## 2017-03-28 DIAGNOSIS — R0789 Other chest pain: Secondary | ICD-10-CM | POA: Diagnosis not present

## 2017-03-28 DIAGNOSIS — I482 Chronic atrial fibrillation: Secondary | ICD-10-CM

## 2017-03-28 DIAGNOSIS — I318 Other specified diseases of pericardium: Secondary | ICD-10-CM

## 2017-03-28 DIAGNOSIS — I10 Essential (primary) hypertension: Secondary | ICD-10-CM

## 2017-03-28 DIAGNOSIS — I25111 Atherosclerotic heart disease of native coronary artery with angina pectoris with documented spasm: Secondary | ICD-10-CM | POA: Diagnosis not present

## 2017-03-28 DIAGNOSIS — E1122 Type 2 diabetes mellitus with diabetic chronic kidney disease: Secondary | ICD-10-CM | POA: Diagnosis not present

## 2017-03-28 DIAGNOSIS — N183 Chronic kidney disease, stage 3 (moderate): Secondary | ICD-10-CM

## 2017-03-28 DIAGNOSIS — I700 Atherosclerosis of aorta: Secondary | ICD-10-CM | POA: Diagnosis not present

## 2017-03-28 DIAGNOSIS — I129 Hypertensive chronic kidney disease with stage 1 through stage 4 chronic kidney disease, or unspecified chronic kidney disease: Secondary | ICD-10-CM | POA: Diagnosis not present

## 2017-03-28 DIAGNOSIS — E785 Hyperlipidemia, unspecified: Secondary | ICD-10-CM

## 2017-03-28 MED ORDER — METOPROLOL SUCCINATE ER 50 MG PO TB24: 50.0000 mg | ORAL_TABLET | Freq: Every day | ORAL | 3 refills | Status: DC

## 2017-03-28 MED ORDER — DILTIAZEM HCL ER 120 MG PO CP12: 120.0000 mg | ORAL_CAPSULE | Freq: Two times a day (BID) | ORAL | 3 refills | Status: DC

## 2017-03-28 NOTE — Progress Notes (Signed)
Cardiology Office Note  Date:  03/28/2017   ID:  Ernest [name], DOB 07/26/1951, MRN [id number]  PCP:  Golden Pop, MD   Chief Complaint  Patient presents with  . other    6 week follow up. Patient c/o swelling in both legs. Patient stated right leg swells worse. Meds reviewed verbally with patient.     HPI:  Ernest [name] is a 66 y.o. 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 Doses was making him tired On his last clinic visit dose was increased up to 75 mg but again continue to take this only in the evening. On the higher dose, has worsening fatigue in the daytime, struggling to get through, stay awake  Periodically has tachycardia, typically resolves with resting Does not take extra metoprolol for breakthrough palpitations  EKG personally reviewed by myself on todays visit Shows atrial fibrillation with ventricular rate 76 bpm, nonspecific T wave abnormality  Other past medical history reviewed heart catheterization in 2015.    PMH:   has a past medical history of Asthma; Diabetes mellitus; GERD (gastroesophageal reflux disease); Hyperlipidemia; and Hypertension.  PSH:    Past Surgical History:  Procedure Laterality Date  . ANKLE SURGERY    . CARDIAC CATHETERIZATION  05/2010   [facility]  . CARDIAC CATHETERIZATION     [facility]  . CARDIAC CATHETERIZATION     [facility]  . CARDIAC CATHETERIZATION  07/2014   [facility]  . TESTICLE SURGERY  70's  . TUMOR EXCISION     Neck and finger; benign  . WRIST SURGERY      Current Outpatient Prescriptions   Medication Sig Dispense Refill  . albuterol (PROVENTIL HFA;VENTOLIN HFA) 108 (90 Base) MCG/ACT inhaler Inhale 2 puffs into the lungs every 6 (six) hours as needed for wheezing or shortness of breath. 1 Inhaler 3  . atorvastatin (LIPITOR) 40 MG tablet Take 1 tablet (40 mg total) by mouth daily at 6 PM. 90 tablet 4  . furosemide (LASIX) 40 MG tablet Take 0.5 tablets (20 mg total) by mouth 2 (two) times daily. 180 tablet 4  . gabapentin (NEURONTIN) 600 MG tablet Take 2 tablets (1,200 mg total) by mouth at bedtime. 180 tablet 4  . glipiZIDE (GLUCOTROL) 5 MG tablet Take 1 tablet (5 mg total) by mouth daily before breakfast. 90 tablet 4  . isosorbide mononitrate (IMDUR) 60 MG 24 hr tablet Take 1 tablet (60 mg total) by mouth daily. 90 tablet 4  . metFORMIN (GLUCOPHAGE) 500 MG tablet Take 2 tablets (1,000 mg total) by mouth 2 (two) times daily. 360 tablet 4  . omeprazole (PRILOSEC) 20 MG capsule Take 20 mg by mouth 2 (two) times daily before a meal.     . ONGLYZA 5 MG TABS tablet Take 1 tablet (5 mg total) by mouth daily. 90 tablet 4  . ramipril (ALTACE) 5 MG capsule Take 1 capsule (5 mg total) by mouth daily. 90 capsule 4  . rivaroxaban (XARELTO) 20 MG TABS tablet Take 1 tablet (20 mg total) by mouth daily with supper. 30 tablet 7  . sitaGLIPtin (JANUVIA) 100 MG tablet Take 1 tablet (100 mg total)  by mouth daily. 90 tablet 4  . diltiazem (CARDIZEM SR) 120 MG 12 hr capsule Take 1 capsule (120 mg total) by mouth 2 (two) times daily. 180 capsule 3  . metoprolol succinate (TOPROL-XL) 50 MG 24 hr tablet Take 1 tablet (50 mg total) by mouth daily. Take with or immediately following a meal. 90 tablet 3   No current facility-administered medications for this visit.      Allergies:   Patient has no known allergies.   Social History:  The patient  reports that he quit smoking about 23 years ago. His smoking use included Cigarettes. He has a 112.00 pack-year smoking history. He has never used smokeless  tobacco. He reports that he does not drink alcohol or use drugs.   Family History:   family history includes Alcohol abuse in his other; Cancer in his brother; Coronary artery disease in his other; Depression in his other; Diabetes in his mother and other; Heart disease in his mother; Hyperlipidemia in his mother and other; Hypertension in his mother and other; Kidney Stones in his brother; Pneumonia in his father.    Review of Systems: Review of Systems  Constitutional: Positive for malaise/fatigue.  Respiratory: Negative.   Cardiovascular: Positive for palpitations.  Gastrointestinal: Negative.   Musculoskeletal: Negative.   Neurological: Negative.   Psychiatric/Behavioral: Negative.   All other systems reviewed and are negative.    PHYSICAL EXAM: VS:  BP 128/68 (BP Location: Left Arm, Patient Position: Sitting, Cuff Size: Normal)   Pulse 76   Ht 5\' 10"  (1.778 m)   Wt 275 lb 12 oz (125.1 kg)   BMI 39.57 kg/m  , BMI Body mass index is 39.57 kg/m. GEN: Well nourished, well developed, in no acute distress  HEENT: normal  Neck: no JVD, carotid bruits, or masses Cardiac: RRR; no murmurs, rubs, or gallops,no edema  Respiratory:  clear to auscultation bilaterally, normal work of breathing GI: soft, nontender, nondistended, + BS MS: no deformity or atrophy  Skin: warm and dry, no rash Neuro:  Strength and sensation are intact Psych: euthymic mood, full affect    Recent Labs: 01/30/2017: ALT 29; BUN 14; Creatinine, Ser 0.97; Platelets 205; Potassium 4.1; Sodium 143; TSH 2.760    Lipid Panel Lab Results  Component Value Date   CHOL 112 01/30/2017   HDL 30 (L) 01/30/2017   LDLCALC 44 01/30/2017   TRIG 191 (H) 01/30/2017      Wt Readings from Last 3 Encounters:  03/28/17 275 lb 12 oz (125.1 kg)  02/13/17 272 lb 8 oz (123.6 kg)  01/30/17 276 lb (125.2 kg)       ASSESSMENT AND PLAN:  Pericardial calcification - Plan: EKG 12-Lead Seen on CT scan  Calcification of  abdominal aorta (HCC) - Plan: EKG 12-Lead Cholesterol at goal  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  Essential hypertension - Plan: EKG 12-Lead Significant fatigue on metoprolol, skipping doses, only taking this once a day Recommended we hold the metoprolol tartrate, Recommended he try metoprolol succinate 50 mg in the evening Hold the amlodipine and change to diltiazem 120 mg daily If he continues to have significant fatigue, we could decrease the metoprolol succinate down to 25 mg in the evening. Diltiazem could be titrated upwards if needed but he does have mild lower extremity edema, trace pitting  Atherosclerosis of native coronary artery of native heart with  angina pectoris with documented spasm ([place]) - Plan: EKG 12-Lead  Permanent atrial fibrillation ([place]) - Plan: EKG 12-Lead Medication changes as above, Made to minimize fatigue, control heart rate  Hyperlipidemia, unspecified hyperlipidemia type - Plan: EKG 12-Lead Cholesterol is at goal on the current lipid regimen. No changes to the medications were made.  Atypical chest pain - Plan: EKG 12-Lead Denies having any significant chest pain on today's visit  Chronic diastolic CHF [id number] mg Lasix twice a day, unable to tolerate potassium pill Occasionally takes extra Lasix for ankle swelling Likely exacerbated by underlying atrial fibrillation   Total encounter time more than 25 minutes  Greater than 50% was spent in counseling and coordination of care with the patient    Disposition:   F/U  6 months  Orders Placed This Encounter  Procedures  . EKG 12-Lead     Signed, Esmond Plants, M.D., Ph.D. 03/28/2017  [place], [place]

## 2017-03-28 NOTE — Patient Instructions (Addendum)
Medication Instructions:   Stop the metoprolol tartrate Stop the amlodipine  Start metoprolol succinate 50 one a day Start diltiazem 120 mg one a day  If you have significant fatigue, Cut the metoprolol succinate in 1/2 daily   Labwork:  No new labs needed  Testing/Procedures:  No further testing at this time   I recommend watching educational videos on topics of interest to you at:       [email or web address].goemmi.com  Enter code: [email or web address]    Follow-Up: It was a pleasure seeing you in the office today. Please call us if you have new issues that need to be addressed before your next appt.  [phone]  Your physician wants you to follow-up in:  3 months with Dr. Saunders Revel   If you need a refill on your cardiac medications before your next appointment, please call your pharmacy.

## 2017-08-01 ENCOUNTER — Encounter: Payer: Self-pay | Admitting: Family Medicine

## 2017-08-01 ENCOUNTER — Ambulatory Visit (INDEPENDENT_AMBULATORY_CARE_PROVIDER_SITE_OTHER): Payer: Medicare Other | Admitting: Family Medicine

## 2017-08-01 VITALS — BP 129/81 | HR 86 | Temp 98.1°F | Ht 70.0 in | Wt 273.3 lb

## 2017-08-01 DIAGNOSIS — I10 Essential (primary) hypertension: Secondary | ICD-10-CM | POA: Diagnosis not present

## 2017-08-01 DIAGNOSIS — N183 Chronic kidney disease, stage 3 (moderate): Secondary | ICD-10-CM | POA: Diagnosis not present

## 2017-08-01 DIAGNOSIS — E785 Hyperlipidemia, unspecified: Secondary | ICD-10-CM

## 2017-08-01 DIAGNOSIS — I129 Hypertensive chronic kidney disease with stage 1 through stage 4 chronic kidney disease, or unspecified chronic kidney disease: Secondary | ICD-10-CM | POA: Diagnosis not present

## 2017-08-01 DIAGNOSIS — I482 Chronic atrial fibrillation, unspecified: Secondary | ICD-10-CM

## 2017-08-01 DIAGNOSIS — E1122 Type 2 diabetes mellitus with diabetic chronic kidney disease: Secondary | ICD-10-CM | POA: Diagnosis not present

## 2017-08-01 MED ORDER — RIVAROXABAN 20 MG PO TABS: 20.0000 mg | ORAL_TABLET | Freq: Every day | ORAL | 7 refills | Status: DC

## 2017-08-01 MED ORDER — CLOTRIMAZOLE-BETAMETHASONE 1-0.05 % EX CREA: 1.0000 "application " | TOPICAL_CREAM | Freq: Two times a day (BID) | CUTANEOUS | 0 refills | Status: DC

## 2017-08-01 NOTE — Assessment & Plan Note (Signed)
The current medical regimen is effective;  continue present plan and medications.  

## 2017-08-01 NOTE — Progress Notes (Signed)
BP 129/81   Pulse 86   Temp 98.1 F (36.7 C)   Ht 5\' 10"  (1.778 m)   Wt 273 lb 4.8 oz (124 kg)   BMI 39.21 kg/m    Subjective:    Patient ID: Ernest [name], male    DOB: 25-Dec-1950, 66 y.o.   MRN: [id number]  HPI: Ernest [name] is a 66 y.o. male  Chief Complaint  Patient presents with  . Follow-up  Patient with intermittent chronic blistering itching burning sensation of his foot area of increased calluses. Has been using some over-the-counter cream 2-3 times a day for 2 months hasn't done anything. Otherwise diabetes blood pressures been doing well no complaints noted low blood sugar spells no bleeding bruising issues with Xarelto which he takes faithfully. Relevant past medical, surgical, family and social history reviewed and updated as indicated. Interim medical history since our last visit reviewed. Allergies and medications reviewed and updated.  Review of Systems  Constitutional: Negative.   Respiratory: Negative.   Cardiovascular: Negative.     Per HPI unless specifically indicated above     Objective:    BP 129/81   Pulse 86   Temp 98.1 F (36.7 C)   Ht 5\' 10"  (1.778 m)   Wt 273 lb 4.8 oz (124 kg)   BMI 39.21 kg/m   Wt Readings from Last 3 Encounters:  08/01/17 273 lb 4.8 oz (124 kg)  03/28/17 275 lb 12 oz (125.1 kg)  02/13/17 272 lb 8 oz (123.6 kg)    Physical Exam  Constitutional: He is oriented to person, place, and time. He appears well-developed and well-nourished.  HENT:  Head: Normocephalic and atraumatic.  Eyes: Conjunctivae and EOM are normal.  Neck: Normal range of motion.  Cardiovascular: Normal rate, regular rhythm and normal heart sounds.   Pulmonary/Chest: Effort normal and breath sounds normal.  Musculoskeletal: Normal range of motion.  Neurological: He is alert and oriented to person, place, and time.  Skin: No erythema.  Changes of tinea on foot  Psychiatric: He has a normal mood and affect. His behavior is normal.  Judgment and thought content normal.    Results for orders placed or performed in visit on 01/30/17  Bayer DCA Hb A1c Waived  Result Value Ref Range   Bayer DCA Hb A1c Waived 6.8 <7.0 %  Comprehensive metabolic panel  Result Value Ref Range   Glucose 147 (H) 65 - 99 mg/dL   BUN 14 8 - 27 mg/dL   Creatinine, Ser 0.97 0.76 - 1.27 mg/dL   GFR calc non Af Amer 81 >59 mL/min/1.73   GFR calc Af Amer 94 >59 mL/min/1.73   BUN/Creatinine Ratio 14 10 - 24   Sodium 143 134 - 144 mmol/L   Potassium 4.1 3.5 - 5.2 mmol/L   Chloride 100 96 - 106 mmol/L   CO2 25 18 - 29 mmol/L   Calcium 9.8 8.6 - 10.2 mg/dL   Total Protein 7.1 6.0 - 8.5 g/dL   Albumin 4.5 3.6 - 4.8 g/dL   Globulin, Total 2.6 1.5 - 4.5 g/dL   Albumin/Globulin Ratio 1.7 1.2 - 2.2   Bilirubin Total 0.9 0.0 - 1.2 mg/dL   Alkaline Phosphatase 81 39 - 117 IU/L   AST 32 0 - 40 IU/L   ALT 29 0 - 44 IU/L  CBC with Differential/Platelet  Result Value Ref Range   WBC 8.6 3.4 - 10.8 x10E3/uL   RBC 5.06 4.14 - 5.80 x10E6/uL   Hemoglobin 13.4  13.0 - 17.7 g/dL   Hematocrit 39.9 37.5 - 51.0 %   MCV 79 79 - 97 fL   MCH 26.5 (L) 26.6 - 33.0 pg   MCHC 33.6 31.5 - 35.7 g/dL   RDW 15.8 (H) 12.3 - 15.4 %   Platelets 205 150 - 379 x10E3/uL   Neutrophils 41 Not Estab. %   Lymphs 50 Not Estab. %   Monocytes 5 Not Estab. %   Eos 3 Not Estab. %   Basos 1 Not Estab. %   Neutrophils Absolute 3.6 1.4 - 7.0 x10E3/uL   Lymphocytes Absolute 4.3 (H) 0.7 - 3.1 x10E3/uL   Monocytes Absolute 0.5 0.1 - 0.9 x10E3/uL   EOS (ABSOLUTE) 0.2 0.0 - 0.4 x10E3/uL   Basophils Absolute 0.0 0.0 - 0.2 x10E3/uL   Immature Granulocytes 0 Not Estab. %   Immature Grans (Abs) 0.0 0.0 - 0.1 x10E3/uL  Lipid panel  Result Value Ref Range   Cholesterol, Total 112 100 - 199 mg/dL   Triglycerides 191 (H) 0 - 149 mg/dL   HDL 30 (L) >39 mg/dL   VLDL Cholesterol Cal 38 5 - 40 mg/dL   LDL Calculated 44 0 - 99 mg/dL   Chol/HDL Ratio 3.7 0.0 - 5.0 ratio units  PSA  Result  Value Ref Range   Prostate Specific Ag, Serum 1.0 0.0 - 4.0 ng/mL  TSH  Result Value Ref Range   TSH 2.760 0.450 - 4.500 uIU/mL  Urinalysis, Routine w reflex microscopic  Result Value Ref Range   Specific Gravity, UA 1.010 1.005 - 1.030   pH, UA 6.0 5.0 - 7.5   Color, UA Yellow Yellow   Appearance Ur Clear Clear   Leukocytes, UA Negative Negative   Protein, UA Negative Negative/Trace   Glucose, UA Negative Negative   Ketones, UA Negative Negative   RBC, UA Trace (A) Negative   Bilirubin, UA Negative Negative   Urobilinogen, Ur 0.2 0.2 - 1.0 mg/dL   Nitrite, UA Negative Negative      Assessment & Plan:   Problem List Items Addressed This Visit      Cardiovascular and Mediastinum   Atrial fibrillation ([place])    The current medical regimen is effective;  continue present plan and medications.       Relevant Medications   rivaroxaban (XARELTO) 20 MG TABS tablet   Essential hypertension    The current medical regimen is effective;  continue present plan and medications.       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       Relevant Medications   rivaroxaban (XARELTO) 20 MG TABS tablet   Other Relevant Orders   Bayer DCA Hb A1c Waived   Basic metabolic panel   LP+ALT+AST Piccolo, Waived     Other   Hyperlipidemia    The current medical regimen is effective;  continue present plan and medications.       Relevant Medications   rivaroxaban (XARELTO) 20 MG TABS tablet   Other Relevant Orders   Bayer DCA Hb A1c Waived   Basic metabolic panel   LP+ALT+AST Piccolo, Waived       Follow up plan: Return in about 3 months (  around 11/01/2017) for Hemoglobin A1c.

## 2017-08-02 ENCOUNTER — Encounter: Payer: Self-pay | Admitting: Family Medicine

## 2017-08-02 LAB — BASIC METABOLIC PANEL
BUN/Creatinine Ratio: 14 (ref 10–24)
BUN: 15 mg/dL (ref 8–27)
CO2: 23 mmol/L (ref 20–29)
Calcium: 9.3 mg/dL (ref 8.6–10.2)
Chloride: 103 mmol/L (ref 96–106)
Creatinine, Ser: 1.08 mg/dL (ref 0.76–1.27)
GFR calc Af Amer: 82 mL/min/{1.73_m2} (ref >59)
GFR calc non Af Amer: 71 mL/min/{1.73_m2} (ref >59)
Glucose: 143 mg/dL — ABNORMAL HIGH (ref 65–99)
Potassium: 3.9 mmol/L (ref 3.5–5.2)
Sodium: 142 mmol/L (ref 134–144)

## 2017-08-02 LAB — LP+ALT+AST PICCOLO, WAIVED
ALT (SGPT) Piccolo, Waived: 33 U/L (ref 10–47)
AST (SGOT) Piccolo, Waived: 34 U/L (ref 11–38)
Chol/HDL Ratio Piccolo,Waive: 6.3 mg/dL — ABNORMAL HIGH
Cholesterol Piccolo, Waived: 195 mg/dL (ref <200)
HDL Chol Piccolo, Waived: 31 mg/dL — ABNORMAL LOW (ref >59)
LDL Chol Calc Piccolo Waived: 110 mg/dL — ABNORMAL HIGH (ref <100)
Triglycerides Piccolo,Waived: 268 mg/dL — ABNORMAL HIGH (ref <150)
VLDL Chol Calc Piccolo,Waive: 54 mg/dL — ABNORMAL HIGH (ref <30)

## 2017-08-02 LAB — BAYER DCA HB A1C WAIVED: HB A1C (BAYER DCA - WAIVED): 6.1 % (ref <7.0)

## 2017-10-13 ENCOUNTER — Other Ambulatory Visit: Payer: Self-pay | Admitting: Family Medicine

## 2017-10-13 DIAGNOSIS — I482 Chronic atrial fibrillation, unspecified: Secondary | ICD-10-CM

## 2017-11-01 ENCOUNTER — Ambulatory Visit: Payer: Medicare Other | Admitting: Family Medicine

## 2017-11-02 ENCOUNTER — Ambulatory Visit (INDEPENDENT_AMBULATORY_CARE_PROVIDER_SITE_OTHER): Payer: Medicare Other | Admitting: Family Medicine

## 2017-11-02 ENCOUNTER — Encounter: Payer: Self-pay | Admitting: Family Medicine

## 2017-11-02 VITALS — BP 138/86 | HR 93 | Wt 270.0 lb

## 2017-11-02 DIAGNOSIS — I129 Hypertensive chronic kidney disease with stage 1 through stage 4 chronic kidney disease, or unspecified chronic kidney disease: Secondary | ICD-10-CM

## 2017-11-02 DIAGNOSIS — N183 Chronic kidney disease, stage 3 (moderate): Secondary | ICD-10-CM

## 2017-11-02 DIAGNOSIS — I482 Chronic atrial fibrillation: Secondary | ICD-10-CM | POA: Diagnosis not present

## 2017-11-02 DIAGNOSIS — E1122 Type 2 diabetes mellitus with diabetic chronic kidney disease: Secondary | ICD-10-CM

## 2017-11-02 DIAGNOSIS — I10 Essential (primary) hypertension: Secondary | ICD-10-CM | POA: Diagnosis not present

## 2017-11-02 DIAGNOSIS — Z23 Encounter for immunization: Secondary | ICD-10-CM | POA: Diagnosis not present

## 2017-11-02 DIAGNOSIS — I4821 Permanent atrial fibrillation: Secondary | ICD-10-CM

## 2017-11-02 LAB — BAYER DCA HB A1C WAIVED: HB A1C (BAYER DCA - WAIVED): 6.8 % (ref <7.0)

## 2017-11-02 NOTE — Progress Notes (Signed)
BP 138/86 (BP Location: Left Arm)   Pulse 93   Wt 270 lb (122.5 kg)   SpO2 99%   BMI 38.74 kg/m    Subjective:    Patient ID: Ernest [name], male    DOB: [birth year], 66 y.o.   MRN: [id number]  HPI: Ernest [name] is a 66 y.o. male  Recheck diabetes medical problems. Blood sugar doing well with no complaints in spite of under great deal of stress with loss of his daughter and birth of her granddaughter. taking Xarelto without problems bleeding or bruising. No low blood sugar spells. Blood pressure doing stable without issues taking medications without problems and faithfully.  Relevant past medical, surgical, family and social history reviewed and updated as indicated. Interim medical history since our last visit reviewed. Allergies and medications reviewed and updated.  Review of Systems  Constitutional: Negative.   Respiratory: Negative.   Cardiovascular: Negative.     Per HPI unless specifically indicated above     Objective:    BP 138/86 (BP Location: Left Arm)   Pulse 93   Wt 270 lb (122.5 kg)   SpO2 99%   BMI 38.74 kg/m   Wt Readings from Last 3 Encounters:  11/02/17 270 lb (122.5 kg)  08/01/17 273 lb 4.8 oz (124 kg)  03/28/17 275 lb 12 oz (125.1 kg)    Physical Exam  Constitutional: He is oriented to person, place, and time. He appears well-developed and well-nourished.  HENT:  Head: Normocephalic and atraumatic.  Eyes: Conjunctivae and EOM are normal.  Neck: Normal range of motion.  Cardiovascular: Normal rate, regular rhythm and normal heart sounds.  Pulmonary/Chest: Effort normal and breath sounds normal.  Musculoskeletal: Normal range of motion.  Neurological: He is alert and oriented to person, place, and time.  Skin: No erythema.  Psychiatric: He has a normal mood and affect. His behavior is normal. Judgment and thought content normal.    Results for orders placed or performed in visit on 08/01/17  Bayer DCA Hb A1c Waived  Result  Value Ref Range   Bayer DCA Hb A1c Waived 6.1 <0.0 %  Basic metabolic panel  Result Value Ref Range   Glucose 143 (H) 65 - 99 mg/dL   BUN 15 8 - 27 mg/dL   Creatinine, Ser 1.08 0.76 - 1.27 mg/dL   GFR calc non Af Amer 71 >59 mL/min/1.73   GFR calc Af Amer 82 >59 mL/min/1.73   BUN/Creatinine Ratio 14 10 - 24   Sodium 142 134 - 144 mmol/L   Potassium 3.9 3.5 - 5.2 mmol/L   Chloride 103 96 - 106 mmol/L   CO2 23 20 - 29 mmol/L   Calcium 9.3 8.6 - 10.2 mg/dL  LP+ALT+AST Piccolo, Waived  Result Value Ref Range   ALT (SGPT) Piccolo, Waived 33 10 - 47 U/L   AST (SGOT) Piccolo, Waived 34 11 - 38 U/L   Cholesterol Piccolo, Waived 195 <200 mg/dL   HDL Chol Piccolo, Waived 31 (L) >59 mg/dL   Triglycerides Piccolo,Waived 268 (H) <150 mg/dL   Chol/HDL Ratio Piccolo,Waive 6.3 (H) mg/dL   LDL Chol Calc Piccolo Waived 110 (H) <100 mg/dL   VLDL Chol Calc Piccolo,Waive 54 (H) <30 mg/dL      Assessment & Plan:   Problem List Items Addressed This Visit      Cardiovascular and Mediastinum   Atrial fibrillation ([facility])    Stable on Xarelto with no bleeding bruising issues      Essential hypertension -  Primary    The current medical regimen is effective;  continue present plan and medications.       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       Relevant Orders   Bayer DCA Hb A1c Waived    Other Visit Diagnoses    Needs flu shot       Relevant Orders   Flu vaccine HIGH DOSE PF (Fluzone High dose) (Completed)       Follow up plan: Return in about 3 months (around 02/02/2018) for Physical Exam, Hemoglobin A1c.

## 2017-11-02 NOTE — Assessment & Plan Note (Signed)
The current medical regimen is effective;  continue present plan and medications.  

## 2017-11-02 NOTE — Assessment & Plan Note (Signed)
Stable on Xarelto with no bleeding bruising issues

## 2017-12-13 ENCOUNTER — Encounter: Payer: Self-pay | Admitting: Family Medicine

## 2018-01-31 ENCOUNTER — Encounter: Payer: Medicare Other | Admitting: Family Medicine

## 2018-02-01 ENCOUNTER — Ambulatory Visit (INDEPENDENT_AMBULATORY_CARE_PROVIDER_SITE_OTHER): Payer: Medicare Other

## 2018-02-01 VITALS — BP 138/72 | HR 88 | Temp 97.9°F | Resp 16 | Ht 70.0 in | Wt 278.4 lb

## 2018-02-01 DIAGNOSIS — R5383 Other fatigue: Secondary | ICD-10-CM

## 2018-02-01 DIAGNOSIS — E785 Hyperlipidemia, unspecified: Secondary | ICD-10-CM

## 2018-02-01 DIAGNOSIS — Z0000 Encounter for general adult medical examination without abnormal findings: Secondary | ICD-10-CM

## 2018-02-01 DIAGNOSIS — N400 Enlarged prostate without lower urinary tract symptoms: Secondary | ICD-10-CM

## 2018-02-01 DIAGNOSIS — I10 Essential (primary) hypertension: Secondary | ICD-10-CM | POA: Diagnosis not present

## 2018-02-01 DIAGNOSIS — E119 Type 2 diabetes mellitus without complications: Secondary | ICD-10-CM | POA: Diagnosis not present

## 2018-02-01 LAB — URINALYSIS, ROUTINE W REFLEX MICROSCOPIC
Bilirubin, UA: NEGATIVE
Leukocytes, UA: NEGATIVE
Nitrite, UA: NEGATIVE
Specific Gravity, UA: 1.025 (ref 1.005–1.030)
Urobilinogen, Ur: 2 mg/dL — ABNORMAL HIGH (ref 0.2–1.0)
pH, UA: 6 (ref 5.0–7.5)

## 2018-02-01 LAB — MICROSCOPIC EXAMINATION

## 2018-02-01 NOTE — Progress Notes (Signed)
Subjective:   Ernest [name] is a 67 y.o. male who presents for Medicare Annual/Subsequent preventive examination.  Review of Systems:   Cardiac Risk Factors include: hypertension;advanced age (>20men, >32 women);male gender;diabetes mellitus;obesity (BMI >30kg/m2);dyslipidemia;smoking/ tobacco exposure     Objective:    Vitals: BP 138/72 (BP Location: Left Arm, Patient Position: Sitting)   Pulse 88   Temp 97.9 F (36.6 C) (Temporal)   Resp 16   Ht 5\' 10"  (1.778 m)   Wt 278 lb 6.4 oz (126.3 kg)   BMI 39.95 kg/m   Body mass index is 39.95 kg/m.  Advanced Directives 02/01/2018  Does Patient Have a Medical Advance Directive? No  Would patient like information on creating a medical advance directive? Yes (MAU/Ambulatory/Procedural Areas - Information given)    Tobacco Social History   Tobacco Use  Smoking Status Former Smoker  . Packs/day: 4.00  . Years: 28.00  . Pack years: 112.00  . Types: Cigarettes  . Last attempt to quit: 08/25/1993  . Years since quitting: 24.4  Smokeless Tobacco Never Used     Counseling given: Not Answered   Clinical Intake:  Pre-visit preparation completed: Yes  Pain : No/denies pain     Nutritional Status: BMI > 30  Obese Nutritional Risks: None Diabetes: Yes CBG done?: No Did pt. bring in CBG monitor from home?: No  How often do you need to have someone help you when you read instructions, pamphlets, or other written materials from your doctor or pharmacy?: 1 - Never What is the last grade level you completed in school?: 8th grade  Interpreter Needed?: No  Information entered by :: Marigene Erler,LPN   Past Medical History:  Diagnosis Date  . Asthma   . Diabetes mellitus    Type II  . GERD (gastroesophageal reflux disease)   . Hyperlipidemia   . Hypertension    Past Surgical History:  Procedure Laterality Date  . ANKLE SURGERY    . CARDIAC CATHETERIZATION  05/2010   [facility]  . CARDIAC CATHETERIZATION     [facility]  .  CARDIAC CATHETERIZATION     [facility]  . CARDIAC CATHETERIZATION  07/2014   [facility]  . TESTICLE SURGERY  70's  . TUMOR EXCISION     Neck and finger; benign  . WRIST SURGERY     Family History  Problem Relation Age of Onset  . Hypertension Mother   . Hyperlipidemia Mother   . Diabetes Mother   . Heart disease Mother        CABG  . Pneumonia Father        rare type  . Diabetes Other   . Depression Other   . Coronary artery disease Other   . Alcohol abuse Other   . Hypertension Other   . Hyperlipidemia Other   . Cancer Brother   . Kidney Stones Brother    Social History   Socioeconomic History  . Marital status: Widowed    Spouse name: None  . Number of children: None  . Years of education: None  . Highest education level: None  Social Needs  . Financial resource strain: Not hard at all  . Food insecurity - worry: Never true  . Food insecurity - inability: Never true  . Transportation needs - medical: No  . Transportation needs - non-medical: No  Occupational History  . Occupation: Full time  Tobacco Use  . Smoking status: Former Smoker    Packs/day: 4.00    Years: 28.00  Pack years: 112.00    Types: Cigarettes    Last attempt to quit: 08/25/1993    Years since quitting: 24.4  . Smokeless tobacco: Never Used  Substance and Sexual Activity  . Alcohol use: No  . Drug use: No  . Sexual activity: None  Other Topics Concern  . None  Social History Narrative   No regular exercise.    Outpatient Encounter Medications as of 02/01/2018  Medication Sig  . albuterol (PROVENTIL HFA;VENTOLIN HFA) 108 (90 Base) MCG/ACT inhaler Inhale 2 puffs into the lungs every 6 (six) hours as needed for wheezing or shortness of breath.  Marland Kitchen atorvastatin (LIPITOR) 40 MG tablet Take 1 tablet (40 mg total) by mouth daily at 6 PM.  . clotrimazole-betamethasone (LOTRISONE) cream Apply 1 application topically 2 (two) times daily.  Marland Kitchen diltiazem (CARDIZEM SR) 120 MG 12 hr capsule Take 1 capsule (120 mg  total) by mouth 2 (two) times daily.  . furosemide (LASIX) 40 MG tablet Take 0.5 tablets (20 mg total) by mouth 2 (two) times daily.  Marland Kitchen gabapentin (NEURONTIN) 600 MG tablet Take 2 tablets (1,200 mg total) by mouth at bedtime.  Marland Kitchen glipiZIDE (GLUCOTROL) 5 MG tablet Take 1 tablet (5 mg total) by mouth daily before breakfast.  . isosorbide mononitrate (IMDUR) 60 MG 24 hr tablet Take 1 tablet (60 mg total) by mouth daily.  . metFORMIN (GLUCOPHAGE) 500 MG tablet Take 2 tablets (1,000 mg total) by mouth 2 (two) times daily.  Marland Kitchen omeprazole (PRILOSEC) 20 MG capsule Take 20 mg by mouth 2 (two) times daily before a meal.   . ramipril (ALTACE) 5 MG capsule Take 1 capsule (5 mg total) by mouth daily.  . rivaroxaban (XARELTO) 20 MG TABS tablet Take 1 tablet (20 mg total) by mouth daily with supper.  . sitaGLIPtin (JANUVIA) 100 MG tablet Take 1 tablet (100 mg total) by mouth daily.  . [DISCONTINUED] ONGLYZA 5 MG TABS tablet Take 1 tablet (5 mg total) by mouth daily. (Patient not taking: Reported on 02/01/2018)   No facility-administered encounter medications on file as of 02/01/2018.     Activities of Daily Living In your present state of health, do you have any difficulty performing the following activities: 02/01/2018  Hearing? 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 Y  Difficulty concentrating or making decisions? N  Walking or climbing stairs? N  Dressing or bathing? N  Doing errands, shopping? N  Preparing Food and eating ? N  Using the Toilet? N  In the past six months, have you accidently leaked urine? N  Do you have problems with loss of bowel control? N  Managing your Medications? N  Managing your Finances? N  Housekeeping or managing your Housekeeping? N  Some recent data might be hidden    Patient Care Team: Guadalupe Maple, MD as PCP - General (Family Medicine)   Assessment:   This is a routine wellness examination for [employer].  Exercise Activities and Dietary  recommendations Current Exercise Habits: The patient does not participate in regular exercise at present, Exercise limited by: None identified  Goals    . DIET - INCREASE WATER INTAKE      recommend drinking at least 6-8 glasses of water a day        Fall Risk Fall Risk  02/01/2018 11/02/2017 01/30/2017 11/25/2015  Falls in the past year? Yes No No Yes  Number falls in past yr: 1 - - 1  Injury with Fall? Yes - -  Yes  Risk Factor Category  High Fall Risk - - -  Follow up Falls prevention discussed - - -   Is the patient's home free of loose throw rugs in walkways, pet beds, electrical cords, etc?   no      Grab bars in the bathroom? no      Handrails on the stairs?   yes      Adequate lighting?   yes  Timed Get Up and Go Performed: Completed in 8 seconds with no use of assistive devices, steady gait. No intervention needed at this time.   Depression Screen PHQ 2/9 Scores 02/01/2018 11/02/2017 01/30/2017 11/25/2015  PHQ - 2 Score 0 0 0 0    Cognitive Function     6CIT Screen 02/01/2018  What Year? 0 points  What month? 0 points  What time? 0 points  Count back from 20 0 points  Months in reverse 0 points  Repeat phrase 2 points  Total Score 2    Immunization History  Administered Date(s) Administered  . Influenza Split 08/25/2013  . Influenza, High Dose Seasonal PF 09/20/2016, 11/02/2017  . Influenza,inj,Quad PF,6+ Mos 11/17/2015  . Influenza-Unspecified 12/23/2014  . Pneumococcal Conjugate-13 11/25/2015  . Pneumococcal Polysaccharide-23 10/25/2013  . Pneumococcal-Unspecified 10/26/2009  . Tdap 11/06/2012  . Zoster 02/04/2013    Qualifies for Shingles Vaccine? Yes, discussed shingrix vaccine   Screening Tests Health Maintenance  Topic Date Due  . FOOT EXAM  11/24/2016  . OPHTHALMOLOGY EXAM  08/16/2017  . COLONOSCOPY  02/01/2019 (Originally 09/12/2017)  . HEMOGLOBIN A1C  05/02/2018  . PNA vac Low Risk Adult (2 of 2 - PPSV23) 10/25/2018  . TETANUS/TDAP   11/06/2022  . INFLUENZA VACCINE  Completed  . Hepatitis C Screening  Completed   Cancer Screenings: Lung: Low Dose CT Chest recommended if Age 62-80 years, 30 pack-year currently smoking OR have quit w/in 15years. Patient does not qualify. Colorectal: completed 09/19/2012, declined rescreening   Additional Screenings:  Hepatitis B/HIV/Syphillis: not indicated Hepatitis C Screening: completed 01/25/2015    Plan:    I have personally reviewed and addressed the Medicare Annual Wellness questionnaire and have noted the following in the patient's chart:  A. Medical and social history B. Use of alcohol, tobacco or illicit drugs  C. Current medications and supplements D. Functional ability and status E.  Nutritional status F.  Physical activity G. Advance directives H. List of other physicians I.  Hospitalizations, surgeries, and ER visits in previous 12 months J.  [place] such as hearing and vision if needed, cognitive and depression L. Referrals and appointments   In addition, I have reviewed and discussed with patient certain preventive protocols, quality metrics, and best practice recommendations. A written personalized care plan for preventive services as well as general preventive health recommendations were provided to patient.   Signed,  Tyler Aas, LPN Nurse Health Advisor   Nurse Notes: needs refill on albuterol inhaler , due for diabetic foot exam.

## 2018-02-01 NOTE — Patient Instructions (Signed)
Ernest [name] , Thank you for taking time to come for your Medicare Wellness Visit. I appreciate your ongoing commitment to your health goals. Please review the following plan we discussed and let me know if I can assist you in the future.   Screening recommendations/referrals: Colonoscopy: completed 09/19/2012 Recommended yearly ophthalmology/optometry visit for glaucoma screening and checkup Recommended yearly dental visit for hygiene and checkup  Vaccinations: Influenza vaccine: up to date Pneumococcal vaccine: up to date Tdap vaccine: up to date Shingles vaccine: up to date  Advanced directives: Advance directive discussed with you today. I have provided a copy for you to complete at home and have notarized. Once this is complete please bring a copy in to our office so we can scan it into your chart.  Conditions/risks identified: recommend drinking at least 6-8 glasses of water a day   Next appointment: Follow up on 02/07/2018 at 1:00pm with Dr.Crissman, Follow up in one year for your annual wellness exam.   Preventive Care 65 Years and Older, Male Preventive care refers to lifestyle choices and visits with your health care provider that can promote health and wellness. What does preventive care include?  A yearly physical exam. This is also called an annual well check.  Dental exams once or twice a year.  Routine eye exams. Ask your health care provider how often you should have your eyes checked.  Personal lifestyle choices, including:  Daily care of your teeth and gums.  Regular physical activity.  Eating a healthy diet.  Avoiding tobacco and drug use.  Limiting alcohol use.  Practicing safe sex.  Taking low doses of aspirin every day.  Taking vitamin and mineral supplements as recommended by your health care provider. What happens during an annual well check? The services and screenings done by your health care provider during your annual well check will depend on  your age, overall health, lifestyle risk factors, and family history of disease. Counseling  Your health care provider may ask you questions about your:  Alcohol use.  Tobacco use.  Drug use.  Emotional well-being.  Home and relationship well-being.  Sexual activity.  Eating habits.  History of falls.  Memory and ability to understand (cognition).  Work and work Statistician. Screening  You may have the following tests or measurements:  Height, weight, and BMI.  Blood pressure.  Lipid and cholesterol levels. These may be checked every 5 years, or more frequently if you are over 64 years old.  Skin check.  Lung cancer screening. You may have this screening every year starting at age 46 if you have a 30-pack-year history of smoking and currently smoke or have quit within the past 15 years.  Fecal occult blood test (FOBT) of the stool. You may have this test every year starting at age 37.  Flexible sigmoidoscopy or colonoscopy. You may have a sigmoidoscopy every 5 years or a colonoscopy every 10 years starting at age 44.  Prostate cancer screening. Recommendations will vary depending on your family history and other risks.  Hepatitis C blood test.  Hepatitis B blood test.  Sexually transmitted disease (STD) testing.  Diabetes screening. This is done by checking your blood sugar (glucose) after you have not eaten for a while (fasting). You may have this done every 1-3 years.  Abdominal aortic aneurysm (AAA) screening. You may need this if you are a current or former smoker.  Osteoporosis. You may be screened starting at age 80 if you are at high risk. Talk with  your health care provider about your test results, treatment options, and if necessary, the need for more tests. Vaccines  Your health care provider may recommend certain vaccines, such as:  Influenza vaccine. This is recommended every year.  Tetanus, diphtheria, and acellular pertussis (Tdap, Td) vaccine.  You may need a Td booster every 10 years.  Zoster vaccine. You may need this after age 47.  Pneumococcal 13-valent conjugate (PCV13) vaccine. One dose is recommended after age 34.  Pneumococcal polysaccharide (PPSV23) vaccine. One dose is recommended after age 34. Talk to your health care provider about which screenings and vaccines you need and how often you need them. This information is not intended to replace advice given to you by your health care provider. Make sure you discuss any questions you have with your health care provider. Document Released: 12/31/2015 Document Revised: 08/23/2016 Document Reviewed: 10/05/2015 Elsevier Interactive Patient Education  2017 [place] Prevention in the Home Falls can cause injuries. They can happen to people of all ages. There are many things you can do to make your home safe and to help prevent falls. What can I do on the outside of my home?  Regularly fix the edges of walkways and driveways and fix any cracks.  Remove anything that might make you trip as you walk through a door, such as a raised step or threshold.  Trim any bushes or trees on the path to your home.  Use bright outdoor lighting.  Clear any walking paths of anything that might make someone trip, such as rocks or tools.  Regularly check to see if handrails are loose or broken. Make sure that both sides of any steps have handrails.  Any raised decks and porches should have guardrails on the edges.  Have any leaves, snow, or ice cleared regularly.  Use sand or salt on walking paths during winter.  Clean up any spills in your garage right away. This includes oil or grease spills. What can I do in the bathroom?  Use night lights.  Install grab bars by the toilet and in the tub and shower. Do not use towel bars as grab bars.  Use non-skid mats or decals in the tub or shower.  If you need to sit down in the shower, use a plastic, non-slip stool.  Keep the  floor dry. Clean up any water that spills on the floor as soon as it happens.  Remove soap buildup in the tub or shower regularly.  Attach bath mats securely with double-sided non-slip rug tape.  Do not have throw rugs and other things on the floor that can make you trip. What can I do in the bedroom?  Use night lights.  Make sure that you have a light by your bed that is easy to reach.  Do not use any sheets or blankets that are too big for your bed. They should not hang down onto the floor.  Have a firm chair that has side arms. You can use this for support while you get dressed.  Do not have throw rugs and other things on the floor that can make you trip. What can I do in the kitchen?  Clean up any spills right away.  Avoid walking on wet floors.  Keep items that you use a lot in easy-to-reach places.  If you need to reach something above you, use a strong step stool that has a grab bar.  Keep electrical cords out of the way.  Do not  use floor polish or wax that makes floors slippery. If you must use wax, use non-skid floor wax.  Do not have throw rugs and other things on the floor that can make you trip. What can I do with my stairs?  Do not leave any items on the stairs.  Make sure that there are handrails on both sides of the stairs and use them. Fix handrails that are broken or loose. Make sure that handrails are as long as the stairways.  Check any carpeting to make sure that it is firmly attached to the stairs. Fix any carpet that is loose or worn.  Avoid having throw rugs at the top or bottom of the stairs. If you do have throw rugs, attach them to the floor with carpet tape.  Make sure that you have a light switch at the top of the stairs and the bottom of the stairs. If you do not have them, ask someone to add them for you. What else can I do to help prevent falls?  Wear shoes that:  Do not have high heels.  Have rubber bottoms.  Are comfortable and fit  you well.  Are closed at the toe. Do not wear sandals.  If you use a stepladder:  Make sure that it is fully opened. Do not climb a closed stepladder.  Make sure that both sides of the stepladder are locked into place.  Ask someone to hold it for you, if possible.  Clearly mark and make sure that you can see:  Any grab bars or handrails.  First and last steps.  Where the edge of each step is.  Use tools that help you move around (mobility aids) if they are needed. These include:  Canes.  Walkers.  Scooters.  Crutches.  Turn on the lights when you go into a dark area. Replace any light bulbs as soon as they burn out.  Set up your furniture so you have a clear path. Avoid moving your furniture around.  If any of your floors are uneven, fix them.  If there are any pets around you, be aware of where they are.  Review your medicines with your doctor. Some medicines can make you feel dizzy. This can increase your chance of falling. Ask your doctor what other things that you can do to help prevent falls. This information is not intended to replace advice given to you by your health care provider. Make sure you discuss any questions you have with your health care provider. Document Released: 09/30/2009 Document Revised: 05/11/2016 Document Reviewed: 01/08/2015 Elsevier Interactive Patient Education  2017 [employer].

## 2018-02-02 LAB — CBC WITH DIFFERENTIAL/PLATELET
Basophils Absolute: 0 10*3/uL (ref 0.0–0.2)
Basos: 0 %
EOS (ABSOLUTE): 0.2 10*3/uL (ref 0.0–0.4)
Eos: 2 %
Hematocrit: 39.7 % (ref 37.5–51.0)
Hemoglobin: 12.7 g/dL — ABNORMAL LOW (ref 13.0–17.7)
Immature Grans (Abs): 0 10*3/uL (ref 0.0–0.1)
Immature Granulocytes: 0 %
Lymphocytes Absolute: 3.2 10*3/uL — ABNORMAL HIGH (ref 0.7–3.1)
Lymphs: 41 %
MCH: 25.6 pg — ABNORMAL LOW (ref 26.6–33.0)
MCHC: 32 g/dL (ref 31.5–35.7)
MCV: 80 fL (ref 79–97)
Monocytes Absolute: 0.4 10*3/uL (ref 0.1–0.9)
Monocytes: 5 %
Neutrophils Absolute: 4.1 10*3/uL (ref 1.4–7.0)
Neutrophils: 52 %
Platelets: 190 10*3/uL (ref 150–379)
RBC: 4.97 x10E6/uL (ref 4.14–5.80)
RDW: 15.7 % — ABNORMAL HIGH (ref 12.3–15.4)
WBC: 7.9 10*3/uL (ref 3.4–10.8)

## 2018-02-02 LAB — COMPREHENSIVE METABOLIC PANEL
ALT: 23 IU/L (ref 0–44)
AST: 24 IU/L (ref 0–40)
Albumin/Globulin Ratio: 1.7 (ref 1.2–2.2)
Albumin: 4.3 g/dL (ref 3.6–4.8)
Alkaline Phosphatase: 83 IU/L (ref 39–117)
BUN/Creatinine Ratio: 18 (ref 10–24)
BUN: 20 mg/dL (ref 8–27)
Bilirubin Total: 0.7 mg/dL (ref 0.0–1.2)
CO2: 24 mmol/L (ref 20–29)
Calcium: 9.2 mg/dL (ref 8.6–10.2)
Chloride: 101 mmol/L (ref 96–106)
Creatinine, Ser: 1.11 mg/dL (ref 0.76–1.27)
GFR calc Af Amer: 79 mL/min/{1.73_m2} (ref >59)
GFR calc non Af Amer: 68 mL/min/{1.73_m2} (ref >59)
Globulin, Total: 2.5 g/dL (ref 1.5–4.5)
Glucose: 203 mg/dL — ABNORMAL HIGH (ref 65–99)
Potassium: 3.8 mmol/L (ref 3.5–5.2)
Sodium: 143 mmol/L (ref 134–144)
Total Protein: 6.8 g/dL (ref 6.0–8.5)

## 2018-02-02 LAB — LIPID PANEL W/O CHOL/HDL RATIO
Cholesterol, Total: 105 mg/dL (ref 100–199)
HDL: 26 mg/dL — ABNORMAL LOW (ref >39)
LDL Calculated: 34 mg/dL (ref 0–99)
Triglycerides: 224 mg/dL — ABNORMAL HIGH (ref 0–149)
VLDL Cholesterol Cal: 45 mg/dL — ABNORMAL HIGH (ref 5–40)

## 2018-02-02 LAB — PSA: Prostate Specific Ag, Serum: 0.8 ng/mL (ref 0.0–4.0)

## 2018-02-02 LAB — TSH: TSH: 2.07 u[IU]/mL (ref 0.450–4.500)

## 2018-02-05 ENCOUNTER — Ambulatory Visit (INDEPENDENT_AMBULATORY_CARE_PROVIDER_SITE_OTHER): Payer: Medicare Other | Admitting: Family Medicine

## 2018-02-05 ENCOUNTER — Telehealth: Payer: Self-pay | Admitting: Family Medicine

## 2018-02-05 ENCOUNTER — Other Ambulatory Visit: Payer: Medicare Other

## 2018-02-05 ENCOUNTER — Encounter: Payer: Self-pay | Admitting: Family Medicine

## 2018-02-05 VITALS — BP 158/100 | HR 103 | Wt 278.0 lb

## 2018-02-05 DIAGNOSIS — D649 Anemia, unspecified: Secondary | ICD-10-CM

## 2018-02-05 DIAGNOSIS — I129 Hypertensive chronic kidney disease with stage 1 through stage 4 chronic kidney disease, or unspecified chronic kidney disease: Secondary | ICD-10-CM | POA: Diagnosis not present

## 2018-02-05 DIAGNOSIS — E119 Type 2 diabetes mellitus without complications: Secondary | ICD-10-CM

## 2018-02-05 DIAGNOSIS — E1122 Type 2 diabetes mellitus with diabetic chronic kidney disease: Secondary | ICD-10-CM | POA: Diagnosis not present

## 2018-02-05 DIAGNOSIS — N183 Chronic kidney disease, stage 3 unspecified: Secondary | ICD-10-CM

## 2018-02-05 DIAGNOSIS — I700 Atherosclerosis of aorta: Secondary | ICD-10-CM | POA: Diagnosis not present

## 2018-02-05 DIAGNOSIS — I10 Essential (primary) hypertension: Secondary | ICD-10-CM | POA: Diagnosis not present

## 2018-02-05 DIAGNOSIS — I482 Chronic atrial fibrillation: Secondary | ICD-10-CM

## 2018-02-05 DIAGNOSIS — I4821 Permanent atrial fibrillation: Secondary | ICD-10-CM

## 2018-02-05 LAB — COAGUCHEK XS/INR WAIVED
INR: 1.1 (ref 0.9–1.1)
Prothrombin Time: 13.3 s

## 2018-02-05 LAB — BAYER DCA HB A1C WAIVED: HB A1C (BAYER DCA - WAIVED): 7.3 % — ABNORMAL HIGH (ref <7.0)

## 2018-02-05 NOTE — Telephone Encounter (Signed)
Phone call Discussed with patient anemia developing we will check iron studies plus hemoglobin A1c

## 2018-02-05 NOTE — Assessment & Plan Note (Signed)
Takes Xarelto without problems except for cost

## 2018-02-05 NOTE — Progress Notes (Signed)
BP (!) 158/100   Pulse (!) 103   Wt 278 lb (126.1 kg)   SpO2 95%   BMI 39.89 kg/m    Subjective:    Patient ID: Ernest [name], male    DOB: 02-07-1951, 67 y.o.   MRN: [id number]  HPI: [name] is a 67 y.o. male  Chief Complaint  Patient presents with  . Coagulation Disorder   Patient follow-up requesting INR because of some concern about bleeding also eye issues reviewed INR is normal and really not necessary unless he does take warfarin or some specialty type conditions. Patient taking Xarelto with no signs of bleeding bruising type issues Patient had repeat CBC today and iron studies to review for iron deficiency anemia secondary to chronic low-grade bleeding. Hemoglobin A1c also being checked today especially at recommendation of patient's eye doctor as he has had some bilateral vision changes.  Relevant past medical, surgical, family and social history reviewed and updated as indicated. Interim medical history since our last visit reviewed. Allergies and medications reviewed and updated.  Review of Systems  Constitutional: Negative.   Respiratory: Negative.   Cardiovascular: Negative.     Per HPI unless specifically indicated above     Objective:    BP (!) 158/100   Pulse (!) 103   Wt 278 lb (126.1 kg)   SpO2 95%   BMI 39.89 kg/m   Wt Readings from Last 3 Encounters:  02/05/18 278 lb (126.1 kg)  02/01/18 278 lb 6.4 oz (126.3 kg)  11/02/17 270 lb (122.5 kg)    Physical Exam  Constitutional: He is oriented to person, place, and time. He appears well-developed and well-nourished.  HENT:  Head: Normocephalic and atraumatic.  Eyes: Conjunctivae and EOM are normal.  Neck: Normal range of motion.  Cardiovascular: Normal rate, regular rhythm and normal heart sounds.  Pulmonary/Chest: Effort normal and breath sounds normal.  Musculoskeletal: Normal range of motion.  Neurological: He is alert and oriented to person, place, and time.  Skin: No  erythema.  Psychiatric: He has a normal mood and affect. His behavior is normal. Judgment and thought content normal.    Results for orders placed or performed in visit on 02/01/18  Microscopic Examination  Result Value Ref Range   WBC, UA 0-5 0 - 5 /hpf   RBC, UA 3-10 (A) 0 - 2 /hpf   Epithelial Cells (non renal) 0-10 0 - 10 /hpf   Bacteria, UA Few (A) None seen/Few  CBC with Differential  Result Value Ref Range   WBC 7.9 3.4 - 10.8 x10E3/uL   RBC 4.97 4.14 - 5.80 x10E6/uL   Hemoglobin 12.7 (L) 13.0 - 17.7 g/dL   Hematocrit 39.7 37.5 - 51.0 %   MCV 80 79 - 97 fL   MCH 25.6 (L) 26.6 - 33.0 pg   MCHC 32.0 31.5 - 35.7 g/dL   RDW 15.7 (H) 12.3 - 15.4 %   Platelets 190 150 - 379 x10E3/uL   Neutrophils 52 Not Estab. %   Lymphs 41 Not Estab. %   Monocytes 5 Not Estab. %   Eos 2 Not Estab. %   Basos 0 Not Estab. %   Neutrophils Absolute 4.1 1.4 - 7.0 x10E3/uL   Lymphocytes Absolute 3.2 (H) 0.7 - 3.1 x10E3/uL   Monocytes Absolute 0.4 0.1 - 0.9 x10E3/uL   EOS (ABSOLUTE) 0.2 0.0 - 0.4 x10E3/uL   Basophils Absolute 0.0 0.0 - 0.2 x10E3/uL   Immature Granulocytes 0 Not Estab. %  Immature Grans (Abs) 0.0 0.0 - 0.1 x10E3/uL  Comp Met (CMET)  Result Value Ref Range   Glucose 203 (H) 65 - 99 mg/dL   BUN 20 8 - 27 mg/dL   Creatinine, Ser 1.11 0.76 - 1.27 mg/dL   GFR calc non Af Amer 68 >59 mL/min/1.73   GFR calc Af Amer 79 >59 mL/min/1.73   BUN/Creatinine Ratio 18 10 - 24   Sodium 143 134 - 144 mmol/L   Potassium 3.8 3.5 - 5.2 mmol/L   Chloride 101 96 - 106 mmol/L   CO2 24 20 - 29 mmol/L   Calcium 9.2 8.6 - 10.2 mg/dL   Total Protein 6.8 6.0 - 8.5 g/dL   Albumin 4.3 3.6 - 4.8 g/dL   Globulin, Total 2.5 1.5 - 4.5 g/dL   Albumin/Globulin Ratio 1.7 1.2 - 2.2   Bilirubin Total 0.7 0.0 - 1.2 mg/dL   Alkaline Phosphatase 83 39 - 117 IU/L   AST 24 0 - 40 IU/L   ALT 23 0 - 44 IU/L  Lipid Panel w/o Chol/HDL Ratio  Result Value Ref Range   Cholesterol, Total 105 100 - 199 mg/dL    Triglycerides 224 (H) 0 - 149 mg/dL   HDL 26 (L) >39 mg/dL   VLDL Cholesterol Cal 45 (H) 5 - 40 mg/dL   LDL Calculated 34 0 - 99 mg/dL  TSH  Result Value Ref Range   TSH 2.070 0.450 - 4.500 uIU/mL  PSA  Result Value Ref Range   Prostate Specific Ag, Serum 0.8 0.0 - 4.0 ng/mL  Urinalysis, Routine w reflex microscopic  Result Value Ref Range   Specific Gravity, UA 1.025 1.005 - 1.030   pH, UA 6.0 5.0 - 7.5   Color, UA Yellow Yellow   Appearance Ur Clear Clear   Leukocytes, UA Negative Negative   Protein, UA 1+ (A) Negative/Trace   Glucose, UA 1+ (A) Negative   Ketones, UA Trace Negative   RBC, UA Trace Negative   Bilirubin, UA Negative Negative   Urobilinogen, Ur 2.0 (H) 0.2 - 1.0 mg/dL   Nitrite, UA Negative Negative   Microscopic Examination See below:       Assessment & Plan:   Problem List Items Addressed This Visit      Cardiovascular and Mediastinum   Atrial fibrillation ([place]) - Primary    Takes Xarelto without problems except for cost      Relevant Orders   CoaguChek XS/INR Waived   Essential hypertension    The current medical regimen is effective;  continue present plan and medications.       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  Reviewed diet nutrition weight loss to control diabetes      Relevant Orders   Bayer DCA Hb A1c Waived   Calcification of abdominal aorta (HCC)    Stable         Other   Anemia, unspecified    Anemia is noted elsewhere in the chart will check labs      Relevant Orders   Ferritin   Reticulocytes   Iron and TIBC   CBC with Differential/Platelet       Follow up plan: Return for Physical Exam, As scheduled.

## 2018-02-05 NOTE — Assessment & Plan Note (Signed)
Discussed diabetes poor control and cost of medications patient not buying Januvia because of too expensive.  Reviewed diet nutrition weight loss to control diabetes

## 2018-02-05 NOTE — Assessment & Plan Note (Signed)
The current medical regimen is effective;  continue present plan and medications.  

## 2018-02-05 NOTE — Assessment & Plan Note (Signed)
Discussed patient's declining hemoglobin will check for iron deficiency as patient is on blood thinners.

## 2018-02-05 NOTE — Assessment & Plan Note (Signed)
Stable

## 2018-02-05 NOTE — Assessment & Plan Note (Signed)
Anemia is noted elsewhere in the chart will check labs

## 2018-02-06 ENCOUNTER — Telehealth: Payer: Self-pay | Admitting: Family Medicine

## 2018-02-06 DIAGNOSIS — D509 Iron deficiency anemia, unspecified: Secondary | ICD-10-CM

## 2018-02-06 LAB — CBC WITH DIFFERENTIAL/PLATELET
Basophils Absolute: 0 10*3/uL (ref 0.0–0.2)
Basos: 0 %
EOS (ABSOLUTE): 0.2 10*3/uL (ref 0.0–0.4)
Eos: 2 %
Hematocrit: 40.7 % (ref 37.5–51.0)
Hemoglobin: 13.4 g/dL (ref 13.0–17.7)
Immature Grans (Abs): 0 10*3/uL (ref 0.0–0.1)
Immature Granulocytes: 0 %
Lymphocytes Absolute: 3.2 10*3/uL — ABNORMAL HIGH (ref 0.7–3.1)
Lymphs: 44 %
MCH: 25.1 pg — ABNORMAL LOW (ref 26.6–33.0)
MCHC: 32.9 g/dL (ref 31.5–35.7)
MCV: 76 fL — ABNORMAL LOW (ref 79–97)
Monocytes Absolute: 0.4 10*3/uL (ref 0.1–0.9)
Monocytes: 6 %
Neutrophils Absolute: 3.5 10*3/uL (ref 1.4–7.0)
Neutrophils: 48 %
Platelets: 182 10*3/uL (ref 150–379)
RBC: 5.34 x10E6/uL (ref 4.14–5.80)
RDW: 15.6 % — ABNORMAL HIGH (ref 12.3–15.4)
WBC: 7.3 10*3/uL (ref 3.4–10.8)

## 2018-02-06 LAB — RETICULOCYTES: Retic Ct Pct: 1.5 % (ref 0.6–2.6)

## 2018-02-06 LAB — IRON AND TIBC
Iron Saturation: 8 % — CL (ref 15–55)
Iron: 45 ug/dL (ref 38–169)
Total Iron Binding Capacity: 535 ug/dL — ABNORMAL HIGH (ref 250–450)
UIBC: 490 ug/dL — ABNORMAL HIGH (ref 111–343)

## 2018-02-06 LAB — FERRITIN: Ferritin: 35 ng/mL (ref 30–400)

## 2018-02-06 MED ORDER — FERROUS SULFATE 325 (65 FE) MG PO TABS: 325.0000 mg | ORAL_TABLET | Freq: Every day | ORAL | 2 refills | Status: DC

## 2018-02-06 NOTE — Telephone Encounter (Signed)
Phone call Discussed with patient iron deficiency anemia start iron GI further evaluate.

## 2018-02-07 ENCOUNTER — Encounter: Payer: Medicare Other | Admitting: Family Medicine

## 2018-02-13 ENCOUNTER — Other Ambulatory Visit: Payer: Self-pay | Admitting: Family Medicine

## 2018-02-13 DIAGNOSIS — G2581 Restless legs syndrome: Secondary | ICD-10-CM

## 2018-02-13 DIAGNOSIS — E785 Hyperlipidemia, unspecified: Secondary | ICD-10-CM

## 2018-02-19 ENCOUNTER — Ambulatory Visit: Payer: Medicare Other | Admitting: Gastroenterology

## 2018-02-19 ENCOUNTER — Other Ambulatory Visit: Payer: Self-pay

## 2018-02-19 ENCOUNTER — Encounter: Payer: Self-pay | Admitting: Gastroenterology

## 2018-02-19 VITALS — BP 142/93 | HR 96 | Ht 70.0 in | Wt 277.2 lb

## 2018-02-19 DIAGNOSIS — D500 Iron deficiency anemia secondary to blood loss (chronic): Secondary | ICD-10-CM | POA: Diagnosis not present

## 2018-02-19 DIAGNOSIS — Q2730 Arteriovenous malformation, site unspecified: Secondary | ICD-10-CM | POA: Diagnosis not present

## 2018-02-19 NOTE — Progress Notes (Signed)
Ernest [name]  [place], [place] [address]  Main: [phone]  Fax: [phone]   Gastroenterology Consultation  Referring Provider:     Guadalupe Maple, MD Primary Care Physician:  Ernest Maple, MD Primary Gastroenterologist:  Dr. [name] Reason for Consultation:     Iron deficiency anemia        HPI:   Ernest [name] is a 67 y.o. y/o male referred for consultation & management  by Dr. Jeananne Rama, Ernest How, MD.  Patient reports intermittent BRBPR that he states started about 3 months ago.  Occurs once a week to once a month.  No correlation with straining.  Reports soft bowel movements daily.  Patient does report that he has noticed hemoptysis, occurring intermittently over the last 2 months.  No shortness of breath.  No fever or chills.  He states he discussed this with his primary care doctor as well.  He does not smoke, quit smoking 28 years ago.  He is on Xarelto for A. fib.  Reports intermittent dysphagia with solid foods.  Denies any fatigue, abdominal pain, altered bowel habits, loss of appetite or loss of weight.  Denies any family history of colon cancer.  Denies any nausea vomiting or dyspepsia.  Denies any heartburn.  Please see below records for previous endoscopies.  Patient had a colonoscopy and EGD in March 2015 by Dr. Latron [name] for iron deficiency anemia and heme positive stool.  EGD was reported to be normal.  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  Treated with APC.  Colonoscopy prep was noted to be poor.  Patient also had a colonoscopy for heme positive stool and EGD for heartburn in September 2013 by Dr. Candace [name].  Single sigmoid polyp was removed with cold snare during the colonoscopy.  Pathology report showed fibroblastic polyp EGD was normal.  Past Medical History:  Diagnosis Date  . Asthma   . Diabetes mellitus    Type II  . GERD (gastroesophageal reflux disease)   .  Hyperlipidemia   . Hypertension     Past Surgical History:  Procedure Laterality Date  . ANKLE SURGERY    . CARDIAC CATHETERIZATION  05/2010   [facility]  . CARDIAC CATHETERIZATION     [facility]  . CARDIAC CATHETERIZATION     [facility]  . CARDIAC CATHETERIZATION  07/2014   [facility]  . TESTICLE SURGERY  70's  . TUMOR EXCISION     Neck and finger; benign  . WRIST SURGERY      Prior to Admission medications   Medication Sig Start Date End Date Taking? Authorizing Provider  albuterol (PROVENTIL HFA;VENTOLIN HFA) 108 (90 Base) MCG/ACT inhaler Inhale 2 puffs into the lungs every 6 (six) hours as needed for wheezing or shortness of breath. 10/18/16   Volney American, PA-C  atorvastatin (LIPITOR) 40 MG tablet TAKE 1 TABLET BY MOUTH  DAILY AT 6 PM. 02/14/18   Ernest Maple, MD  clotrimazole-betamethasone (LOTRISONE) cream Apply 1 application topically 2 (two) times daily. 08/01/17   Ernest Maple, MD  diltiazem (CARDIZEM SR) 120 MG 12 hr capsule Take 1 capsule (120 mg total) by mouth 2 (two) times daily. 03/28/17   Minna Merritts, MD  ferrous sulfate 325 (65 FE) MG tablet Take 1 tablet (325 mg total) by mouth daily with breakfast. 02/06/18   Ernest Maple, MD  furosemide (LASIX) 40 MG tablet Take 0.5 tablets (20 mg total) by mouth 2 (two) times  daily. 01/30/17   Ernest Maple, MD  gabapentin (NEURONTIN) 600 MG tablet TAKE 2 TABLETS BY MOUTH AT  BEDTIME 02/14/18   Ernest Maple, MD  glipiZIDE (GLUCOTROL) 5 MG tablet Take 1 tablet (5 mg total) by mouth daily before breakfast. 01/30/17   Ernest Maple, MD  isosorbide mononitrate (IMDUR) 60 MG 24 hr tablet Take 1 tablet (60 mg total) by mouth daily. 01/30/17   Ernest Maple, MD  metFORMIN (GLUCOPHAGE) 500 MG tablet Take 2 tablets (1,000 mg total) by mouth 2 (two) times daily. 01/30/17   Ernest Maple, MD  omeprazole (PRILOSEC) 20 MG capsule Take 20 mg by mouth 2 (two) times daily before a meal.     [provider]  ramipril (ALTACE) 5  MG capsule Take 1 capsule (5 mg total) by mouth daily. 01/30/17   Ernest Maple, MD  rivaroxaban (XARELTO) 20 MG TABS tablet Take 1 tablet (20 mg total) by mouth daily with supper. 08/01/17   Ernest Maple, MD  sitaGLIPtin (JANUVIA) 100 MG tablet Take 1 tablet (100 mg total) by mouth daily. 01/30/17   Ernest Maple, MD    Family History  Problem Relation Age of Onset  . Hypertension Mother   . Hyperlipidemia Mother   . Diabetes Mother   . Heart disease Mother        CABG  . Pneumonia Father        rare type  . Diabetes Other   . Depression Other   . Coronary artery disease Other   . Alcohol abuse Other   . Hypertension Other   . Hyperlipidemia Other   . Cancer Brother   . Kidney Stones Brother      Social History   Tobacco Use  . Smoking status: Former Smoker    Packs/day: 4.00    Years: 28.00    Pack years: 112.00    Types: Cigarettes    Last attempt to quit: 08/25/1993    Years since quitting: 24.5  . Smokeless tobacco: Never Used  Substance Use Topics  . Alcohol use: No  . Drug use: No    Allergies as of 02/19/2018  . (No Known Allergies)    Review of Systems:    All systems reviewed and negative except where noted in HPI.   Physical Exam:  There were no vitals taken for this visit. No LMP for male patient. Psych:  Alert and cooperative. Normal mood and affect. General:   Alert,  Well-developed, well-nourished, pleasant and cooperative in NAD Head:  Normocephalic and atraumatic. Eyes:  Sclera clear, no icterus.   Conjunctiva pink. Ears:  Normal auditory acuity. Nose:  No deformity, discharge, or lesions. Mouth:  No deformity or lesions,oropharynx pink & moist. Neck:  Supple; no masses or thyromegaly. Lungs:  Respirations even and unlabored.  Clear throughout to auscultation.   No wheezes, crackles, or rhonchi. No acute distress. Heart:  Regular rate and rhythm; no murmurs, clicks, rubs, or gallops. Abdomen:  Normal bowel sounds.  No bruits.  Soft,  non-tender and non-distended without masses, hepatosplenomegaly or hernias noted.  No guarding or rebound tenderness.    Msk:  Symmetrical without gross deformities. Good, equal movement & strength bilaterally. Pulses:  Normal pulses noted. Extremities:  No clubbing or edema.  No cyanosis. Neurologic:  Alert and oriented x3;  grossly normal neurologically. Skin:  Intact without significant lesions or rashes. No jaundice. Lymph Nodes:  No significant cervical adenopathy. Psych:  Alert and cooperative. Normal  mood and affect.   Labs: CBC    Component Value Date/Time   WBC 7.3 02/05/2018 1456   WBC 10.8 (H) 07/27/2014 0502   WBC 8.3 WHITE COUNT CONFIRMED ON SMEAR 07/30/2010 0345   RBC 5.34 02/05/2018 1456   RBC 5.40 07/27/2014 0502   RBC 4.59 07/30/2010 0345   HGB 13.4 02/05/2018 1456   HCT 40.7 02/05/2018 1456   PLT 182 02/05/2018 1456   MCV 76 (L) 02/05/2018 1456   MCV 81 07/27/2014 0502   MCH 25.1 (L) 02/05/2018 1456   MCH 26.7 07/27/2014 0502   MCH 28.5 07/30/2010 0345   MCHC 32.9 02/05/2018 1456   MCHC 32.9 07/27/2014 0502   MCHC 32.7 07/30/2010 0345   RDW 15.6 (H) 02/05/2018 1456   RDW 15.0 (H) 07/27/2014 0502   LYMPHSABS 3.2 (H) 02/05/2018 1456   LYMPHSABS 4.7 (H) 07/27/2014 0502   MONOABS 0.7 07/27/2014 0502   EOSABS 0.2 02/05/2018 1456   EOSABS 0.2 07/27/2014 0502   BASOSABS 0.0 02/05/2018 1456   BASOSABS 0.0 07/27/2014 0502   CMP     Component Value Date/Time   NA 143 02/01/2018 0846   NA 139 07/25/2014 0425   K 3.8 02/01/2018 0846   K 3.7 07/25/2014 0425   CL 101 02/01/2018 0846   CL 105 07/25/2014 0425   CO2 24 02/01/2018 0846   CO2 29 07/25/2014 0425   GLUCOSE 203 (H) 02/01/2018 0846   GLUCOSE 125 (H) 07/25/2014 0425   BUN 20 02/01/2018 0846   BUN 16 07/25/2014 0425   CREATININE 1.11 02/01/2018 0846   CREATININE 1.21 07/25/2014 0425   CALCIUM 9.2 02/01/2018 0846   CALCIUM 8.9 07/25/2014 0425   PROT 6.8 02/01/2018 0846   ALBUMIN 4.3 02/01/2018  0846   AST 24 02/01/2018 0846   AST 34 08/01/2017 0902   ALT 23 02/01/2018 0846   ALT 33 08/01/2017 0902   ALKPHOS 83 02/01/2018 0846   BILITOT 0.7 02/01/2018 0846   GFRNONAA 68 02/01/2018 0846   GFRNONAA >60 07/25/2014 0425   GFRAA 79 02/01/2018 0846   GFRAA >60 07/25/2014 0425    Imaging Studies: No results found.  Assessment and Plan:   Aryeh Butterfield is a 67 y.o. y/o male has been referred for iron deficiency anemia, with reports of intermittent bright red blood per rectum, and recent intermittent hemoptysis (that he states he discussed with his primary care provider) with last colonoscopy and EGD in March 2015 for iron deficiency anemia and heme positive stool showing angiodysplasia in the cecum and ascending colon status post APC  Iron deficiency anemia likely due to underlying AVMs Intermittent bright red blood per rectum likely due to hemorrhoids Prior to any endoscopies, patient will need pulmonology referral, and evaluation with his primary care doctor for his intermittent hemoptysis.  He does not have any shortness of breath, or alarming symptoms at this time to indicate emergent workup for this.  However should be evaluated by primary care provider and pulmonology.  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 patient may need small bowel capsule if these are negative to evaluate for any small bowel AVMs.  We will refer to hematology due to iron deficiency anemia as well to see if he is a candidate for IV iron transfusions Avoid  NSAIDs  I have discussed alternative options, risks & benefits,  which include, but are not limited to, bleeding, infection, perforation,respiratory complication & drug reaction.  The patient agrees with this plan & written  consent will be obtained.     Dr Ernest Antigua

## 2018-02-20 ENCOUNTER — Telehealth: Payer: Self-pay

## 2018-02-21 ENCOUNTER — Ambulatory Visit (INDEPENDENT_AMBULATORY_CARE_PROVIDER_SITE_OTHER): Payer: Medicare Other | Admitting: Family Medicine

## 2018-02-21 ENCOUNTER — Encounter: Payer: Self-pay | Admitting: Family Medicine

## 2018-02-21 VITALS — BP 153/81 | HR 92 | Temp 98.5°F | Wt 280.3 lb

## 2018-02-21 DIAGNOSIS — R042 Hemoptysis: Secondary | ICD-10-CM

## 2018-02-21 LAB — COAGUCHEK XS/INR WAIVED
INR: 1.1 (ref 0.9–1.1)
Prothrombin Time: 13.3 s

## 2018-02-21 NOTE — Assessment & Plan Note (Signed)
CT last year was normal. Given 58-76 pack year smoking history and continued hemoptysis, will obtain CT chest and will check labs including quantiferon. He does have some blood in his nose, so this may be coming from his sinuses. Labs drawn today. Call with any concerns.

## 2018-02-21 NOTE — Patient Instructions (Addendum)
Hemoptysis Hemoptysis is coughing up blood. It can be mild or serious. With mild hemoptysis, you may cough up blood-streaked saliva and mucus (sputum) when you have an infection in your nose, throat, or lungs (respiratory tract). Coughing up 1-2 cups (240-480 mL) of blood within 24 hours (massive hemoptysis) is a medical emergency. The most common cause of hemoptysis is a respiratory tract infection, such as bronchitis or pneumonia. Other common causes include:  A lung tumor or upper airway tumor.  A medical condition that damages the large air passageways (bronchiectasis).  A blood clot in the lungs (pulmonary embolism).  A medical condition that keeps your blood from clotting normally.  Breathing in (inhaling) a small foreign object.  Sometimes the cause is not known. Hemoptysis can be a sign of a minor or serious medical condition, so it is important to see your health care provider. Follow these instructions at home:  Watch your condition for any changes.  Take over-the-counter and prescription medicines only as told by your health care provider.  If you were prescribed an antibiotic medicine, take it as told by your health care provider. Do not stop taking the antibiotic even if you start to feel better.  Return to your normal activities as told by your health care provider. Ask your health care provider what activities are safe for you.  Do not use any products that contain nicotine or tobacco, such as cigarettes and e-cigarettes. If you need help quitting, ask your health care provider.  Keep all follow-up visits as told by your health care provider. This is important. Contact a health care provider if:  You have a fever.  You cough up blood-streaked (blood-tinged) sputum. Get help right away if:  You cough up fresh blood or blood clots.  You have trouble breathing.  You have chest pain. This information is not intended to replace advice given to you by your health care  provider. Make sure you discuss any questions you have with your health care provider. Document Released: 02/12/2002 Document Revised: 09/01/2016 Document Reviewed: 09/01/2016 Elsevier Interactive Patient Education  2018 [employer] Inc[employer]  

## 2018-02-21 NOTE — Progress Notes (Signed)
BP (!) 153/81 (BP Location: Left Arm, Patient Position: Sitting, Cuff Size: Large)   Pulse 92   Temp 98.5 F (36.9 C)   Wt 280 lb 5 oz (127.1 kg)   SpO2 96%   BMI 40.22 kg/m    Subjective:    Patient ID: Ernest [name], male    DOB: 06-Jan-1951, 67 y.o.   MRN: [id number]  HPI: Ernest [name] is a 67 y.o. male  No chief complaint on file.  Has been coughing up blood occasionally over the last 6-7 months. Will go away for a little bit, then comes back. Usually about every other week. Coughing up good sized clots. He usually does it a couple of times He went to GI for evaluation for BRBPR for the last 3 months and they wanted him worked up for the hemoptysis. He notes that he has not had any SOB, no fevers or chills, no weight loss. He is a former smoker, but quit 28 years ago. Was smoking 4 ppd for 10 years, was smoking for 28 years (1-2ppd during that time). He is on xarelto for a. Fib. Not noticing any other bruising.  Relevant past medical, surgical, family and social history reviewed and updated as indicated. Interim medical history since our last visit reviewed. Allergies and medications reviewed and updated.  Review of Systems  Constitutional: Negative.   HENT: Positive for congestion, nosebleeds and rhinorrhea. Negative for dental problem, drooling, ear discharge, ear pain, facial swelling, hearing loss, mouth sores, postnasal drip, sinus pressure, sinus pain, sneezing, sore throat, tinnitus, trouble swallowing and voice change.   Respiratory: Positive for shortness of breath. Negative for apnea, cough, choking, chest tightness, wheezing and stridor.   Cardiovascular: Negative.   Psychiatric/Behavioral: Negative.     Per HPI unless specifically indicated above     Objective:    BP (!) 153/81 (BP Location: Left Arm, Patient Position: Sitting, Cuff Size: Large)   Pulse 92   Temp 98.5 F (36.9 C)   Wt 280 lb 5 oz (127.1 kg)   SpO2 96%   BMI 40.22 kg/m   Wt  Readings from Last 3 Encounters:  02/21/18 280 lb 5 oz (127.1 kg)  02/19/18 277 lb 3.2 oz (125.7 kg)  02/05/18 278 lb (126.1 kg)    Physical Exam  Constitutional: He is oriented to person, place, and time. He appears well-developed and well-nourished. No distress.  HENT:  Head: Normocephalic and atraumatic.  Right Ear: Hearing and external ear normal.  Left Ear: Hearing and external ear normal.  Nose: Mucosal edema and rhinorrhea present. No nose lacerations, sinus tenderness, nasal deformity, septal deviation or nasal septal hematoma. Epistaxis is observed.  No foreign bodies. Right sinus exhibits no maxillary sinus tenderness and no frontal sinus tenderness. Left sinus exhibits no maxillary sinus tenderness and no frontal sinus tenderness.  Mouth/Throat: Uvula is midline, oropharynx is clear and moist and mucous membranes are normal. No oropharyngeal exudate.  Eyes: Conjunctivae, EOM and lids are normal. Pupils are equal, round, and reactive to light. Right eye exhibits no discharge. Left eye exhibits no discharge. No scleral icterus.  Neck: Normal range of motion. Neck supple. No JVD present. No tracheal deviation present. No thyromegaly present.  Cardiovascular: Normal rate, regular rhythm, normal heart sounds and intact distal pulses. Exam reveals no gallop and no friction rub.  No murmur heard. Pulmonary/Chest: Effort normal and breath sounds normal. No stridor. No respiratory distress. He has no wheezes. He has no rales. He exhibits no tenderness.  Musculoskeletal: Normal range of motion.  Lymphadenopathy:    He has no cervical adenopathy.  Neurological: He is alert and oriented to person, place, and time.  Skin: Skin is intact. No rash noted.  Psychiatric: He has a normal mood and affect. His speech is normal and behavior is normal. Judgment and thought content normal. Cognition and memory are normal.  Nursing note and vitals reviewed.   Results for orders placed or performed in  visit on 02/05/18  CoaguChek XS/INR Waived  Result Value Ref Range   INR 1.1 0.9 - 1.1   Prothrombin Time 13.3 sec  Bayer DCA Hb A1c Waived  Result Value Ref Range   Bayer DCA Hb A1c Waived 7.3 (H) <7.0 %  Ferritin  Result Value Ref Range   Ferritin 35 30 - 400 ng/mL  Reticulocytes  Result Value Ref Range   Retic Ct Pct 1.5 0.6 - 2.6 %  Iron and TIBC  Result Value Ref Range   Total Iron Binding Capacity 535 (H) 250 - 450 ug/dL   UIBC 490 (H) 111 - 343 ug/dL   Iron 45 38 - 169 ug/dL   Iron Saturation 8 (LL) 15 - 55 %  CBC with Differential/Platelet  Result Value Ref Range   WBC 7.3 3.4 - 10.8 x10E3/uL   RBC 5.34 4.14 - 5.80 x10E6/uL   Hemoglobin 13.4 13.0 - 17.7 g/dL   Hematocrit 40.7 37.5 - 51.0 %   MCV 76 (L) 79 - 97 fL   MCH 25.1 (L) 26.6 - 33.0 pg   MCHC 32.9 31.5 - 35.7 g/dL   RDW 15.6 (H) 12.3 - 15.4 %   Platelets 182 150 - 379 x10E3/uL   Neutrophils 48 Not Estab. %   Lymphs 44 Not Estab. %   Monocytes 6 Not Estab. %   Eos 2 Not Estab. %   Basos 0 Not Estab. %   Neutrophils Absolute 3.5 1.4 - 7.0 x10E3/uL   Lymphocytes Absolute 3.2 (H) 0.7 - 3.1 x10E3/uL   Monocytes Absolute 0.4 0.1 - 0.9 x10E3/uL   EOS (ABSOLUTE) 0.2 0.0 - 0.4 x10E3/uL   Basophils Absolute 0.0 0.0 - 0.2 x10E3/uL   Immature Granulocytes 0 Not Estab. %   Immature Grans (Abs) 0.0 0.0 - 0.1 x10E3/uL      Assessment & Plan:   Problem List Items Addressed This Visit      Other   Cough with hemoptysis - Primary    CT last year was normal. Given 58-76 pack year smoking history and continued hemoptysis, will obtain CT chest and will check labs including quantiferon. He does have some blood in his nose, so this may be coming from his sinuses. Labs drawn today. Call with any concerns.        Other Visit Diagnoses    Hemoptysis       Relevant Orders   CBC with Differential/Platelet   CoaguChek XS/INR Waived   QuantiFERON-TB Gold Plus   Basic metabolic panel   CT Chest Wo Contrast        Follow up plan: Return if symptoms worsen or fail to improve.

## 2018-02-22 NOTE — Telephone Encounter (Signed)
Left message for pt to contact PCP next week.

## 2018-02-22 NOTE — Telephone Encounter (Signed)
-----   Message from Virgel Manifold, MD sent at 02/19/2018  3:32 PM EST ----- Great Thank you Dr. Wynetta Emery! Jackelyn Poling, can you please let him know to call his PCP! Thank you!  ----- Message ----- From: Valerie Roys, DO Sent: 02/19/2018   2:15 PM To: Virgel Manifold, MD  Dr. Bonna Gains,  Dr. Jeananne Rama is out of the office for another 2 weeks. I'll be happy to see him and get him evaluated. I've got my front office calling him, but if you speak to him, just have him call and we'll get him in ASAP. Have a great day and thanks for taking care of him!  ----- Message ----- From: Virgel Manifold, MD Sent: 02/19/2018   1:45 PM To: Guadalupe Maple, MD  Hi Dr. Jeananne Rama, Thank you for referring this patient to me.  He does need an EGD and colonoscopy.  But, he states he has been having intermittent hemoptysis for the last 2 months.  Could you please evaluate him for this?  We will need this addressed prior to any endoscopies, as they entail risks of sedation.  I will refer him to pulmonology as well.  Thank you

## 2018-02-25 ENCOUNTER — Telehealth: Payer: Self-pay | Admitting: Family Medicine

## 2018-02-25 ENCOUNTER — Telehealth: Payer: Self-pay

## 2018-02-25 DIAGNOSIS — R042 Hemoptysis: Secondary | ICD-10-CM

## 2018-02-25 NOTE — Telephone Encounter (Signed)
I had left message for pt to contact PCP and he has and been seen.

## 2018-02-25 NOTE — Telephone Encounter (Signed)
Pt has been seen by PCP for coughing with blood noted.

## 2018-02-25 NOTE — Telephone Encounter (Signed)
Transferring boxes

## 2018-02-25 NOTE — Telephone Encounter (Signed)
Left message on machine for pt to return call to the office.  

## 2018-02-25 NOTE — Telephone Encounter (Signed)
Patient was called from imaging center in regards to his appt. On the 03/05/2018. Pt stated that imaging center informed him to contact his provider in regards to appointment that was made. Patient unsure of why. Informed patient I would let the provider know.   Pt. Saw Dr. Wynetta Emery 02/21/2018.  Please Advise.  Thank you

## 2018-02-25 NOTE — Telephone Encounter (Signed)
I don't know what's going on here. He's getting the CT scan for his coughing up blood- as I discussed with him at his appointment. Can you find out what he needs?

## 2018-02-26 ENCOUNTER — Other Ambulatory Visit: Payer: Self-pay | Admitting: Family Medicine

## 2018-02-26 ENCOUNTER — Encounter: Payer: Self-pay | Admitting: Family Medicine

## 2018-02-26 DIAGNOSIS — I129 Hypertensive chronic kidney disease with stage 1 through stage 4 chronic kidney disease, or unspecified chronic kidney disease: Secondary | ICD-10-CM

## 2018-02-26 DIAGNOSIS — I10 Essential (primary) hypertension: Secondary | ICD-10-CM

## 2018-02-26 DIAGNOSIS — I482 Chronic atrial fibrillation, unspecified: Secondary | ICD-10-CM

## 2018-02-26 DIAGNOSIS — N183 Chronic kidney disease, stage 3 (moderate): Principal | ICD-10-CM

## 2018-02-26 DIAGNOSIS — E1122 Type 2 diabetes mellitus with diabetic chronic kidney disease: Secondary | ICD-10-CM

## 2018-02-26 LAB — QUANTIFERON-TB GOLD PLUS
QuantiFERON Mitogen Value: >10 IU/mL
QuantiFERON Nil Value: 0.02 IU/mL
QuantiFERON TB1 Ag Value: 0.02 IU/mL
QuantiFERON TB2 Ag Value: 0.02 IU/mL
QuantiFERON-TB Gold Plus: NEGATIVE

## 2018-02-26 LAB — BASIC METABOLIC PANEL
BUN/Creatinine Ratio: 18 (ref 10–24)
BUN: 17 mg/dL (ref 8–27)
CO2: 25 mmol/L (ref 20–29)
Calcium: 9.7 mg/dL (ref 8.6–10.2)
Chloride: 100 mmol/L (ref 96–106)
Creatinine, Ser: 0.96 mg/dL (ref 0.76–1.27)
GFR calc Af Amer: 94 mL/min/{1.73_m2} (ref >59)
GFR calc non Af Amer: 81 mL/min/{1.73_m2} (ref >59)
Glucose: 168 mg/dL — ABNORMAL HIGH (ref 65–99)
Potassium: 4.1 mmol/L (ref 3.5–5.2)
Sodium: 141 mmol/L (ref 134–144)

## 2018-02-26 LAB — CBC WITH DIFFERENTIAL/PLATELET
Basophils Absolute: 0 10*3/uL (ref 0.0–0.2)
Basos: 1 %
EOS (ABSOLUTE): 0.1 10*3/uL (ref 0.0–0.4)
Eos: 2 %
Hematocrit: 40 % (ref 37.5–51.0)
Hemoglobin: 12.9 g/dL — ABNORMAL LOW (ref 13.0–17.7)
Immature Grans (Abs): 0 10*3/uL (ref 0.0–0.1)
Immature Granulocytes: 0 %
Lymphocytes Absolute: 3.9 10*3/uL — ABNORMAL HIGH (ref 0.7–3.1)
Lymphs: 44 %
MCH: 25.2 pg — ABNORMAL LOW (ref 26.6–33.0)
MCHC: 32.3 g/dL (ref 31.5–35.7)
MCV: 78 fL — ABNORMAL LOW (ref 79–97)
Monocytes Absolute: 0.4 10*3/uL (ref 0.1–0.9)
Monocytes: 5 %
Neutrophils Absolute: 4.2 10*3/uL (ref 1.4–7.0)
Neutrophils: 48 %
Platelets: 227 10*3/uL (ref 150–379)
RBC: 5.11 x10E6/uL (ref 4.14–5.80)
RDW: 16.1 % — ABNORMAL HIGH (ref 12.3–15.4)
WBC: 8.7 10*3/uL (ref 3.4–10.8)

## 2018-02-27 NOTE — Telephone Encounter (Signed)
Left message on machine for pt to return call to the office.  

## 2018-03-05 ENCOUNTER — Telehealth: Payer: Self-pay | Admitting: Family Medicine

## 2018-03-05 ENCOUNTER — Ambulatory Visit
Admission: RE | Admit: 2018-03-05 | Discharge: 2018-03-05 | Disposition: A | Discharge status: Home / Self Care | Payer: Medicare Other | Source: Ambulatory Visit | Attending: Family Medicine | Admitting: Family Medicine

## 2018-03-05 DIAGNOSIS — I2510 Atherosclerotic heart disease of native coronary artery without angina pectoris: Secondary | ICD-10-CM | POA: Diagnosis not present

## 2018-03-05 DIAGNOSIS — I700 Atherosclerosis of aorta: Secondary | ICD-10-CM | POA: Insufficient documentation

## 2018-03-05 DIAGNOSIS — R0602 Shortness of breath: Secondary | ICD-10-CM | POA: Diagnosis not present

## 2018-03-05 DIAGNOSIS — K760 Fatty (change of) liver, not elsewhere classified: Secondary | ICD-10-CM | POA: Insufficient documentation

## 2018-03-05 DIAGNOSIS — I517 Cardiomegaly: Secondary | ICD-10-CM | POA: Insufficient documentation

## 2018-03-05 DIAGNOSIS — K8020 Calculus of gallbladder without cholecystitis without obstruction: Secondary | ICD-10-CM | POA: Diagnosis not present

## 2018-03-05 DIAGNOSIS — R042 Hemoptysis: Secondary | ICD-10-CM | POA: Insufficient documentation

## 2018-03-05 NOTE — Telephone Encounter (Signed)
Please let him know that his CT of his chest was normal. Everything looks good.

## 2018-03-05 NOTE — Telephone Encounter (Signed)
Patient notified

## 2018-03-13 ENCOUNTER — Encounter: Payer: Self-pay | Admitting: Family Medicine

## 2018-03-13 ENCOUNTER — Ambulatory Visit (INDEPENDENT_AMBULATORY_CARE_PROVIDER_SITE_OTHER): Payer: Medicare Other | Admitting: Family Medicine

## 2018-03-13 VITALS — BP 139/90 | HR 87 | Ht 70.0 in | Wt 280.0 lb

## 2018-03-13 DIAGNOSIS — E1122 Type 2 diabetes mellitus with diabetic chronic kidney disease: Secondary | ICD-10-CM

## 2018-03-13 DIAGNOSIS — I10 Essential (primary) hypertension: Secondary | ICD-10-CM | POA: Diagnosis not present

## 2018-03-13 DIAGNOSIS — Z7189 Other specified counseling: Secondary | ICD-10-CM

## 2018-03-13 DIAGNOSIS — I129 Hypertensive chronic kidney disease with stage 1 through stage 4 chronic kidney disease, or unspecified chronic kidney disease: Secondary | ICD-10-CM | POA: Diagnosis not present

## 2018-03-13 DIAGNOSIS — R042 Hemoptysis: Secondary | ICD-10-CM | POA: Diagnosis not present

## 2018-03-13 DIAGNOSIS — N183 Chronic kidney disease, stage 3 (moderate): Secondary | ICD-10-CM | POA: Diagnosis not present

## 2018-03-13 DIAGNOSIS — I482 Chronic atrial fibrillation, unspecified: Secondary | ICD-10-CM

## 2018-03-13 DIAGNOSIS — Z6841 Body Mass Index (BMI) 40.0 and over, adult: Secondary | ICD-10-CM

## 2018-03-13 DIAGNOSIS — I4821 Permanent atrial fibrillation: Secondary | ICD-10-CM

## 2018-03-13 MED ORDER — RAMIPRIL 5 MG PO CAPS: 5.0000 mg | ORAL_CAPSULE | Freq: Every day | ORAL | 4 refills | Status: DC

## 2018-03-13 MED ORDER — METFORMIN HCL 500 MG PO TABS: 1000.0000 mg | ORAL_TABLET | Freq: Two times a day (BID) | ORAL | 4 refills | Status: DC

## 2018-03-13 MED ORDER — FUROSEMIDE 40 MG PO TABS: 20.0000 mg | ORAL_TABLET | Freq: Two times a day (BID) | ORAL | 4 refills | Status: DC

## 2018-03-13 MED ORDER — SITAGLIPTIN PHOSPHATE 100 MG PO TABS: 100.0000 mg | ORAL_TABLET | Freq: Every day | ORAL | 4 refills | Status: DC

## 2018-03-13 MED ORDER — RIVAROXABAN 20 MG PO TABS: 20.0000 mg | ORAL_TABLET | Freq: Every day | ORAL | 7 refills | Status: DC

## 2018-03-13 NOTE — Assessment & Plan Note (Signed)
As discussed last month

## 2018-03-13 NOTE — Assessment & Plan Note (Signed)
Discussed diet weight loss nutrition

## 2018-03-13 NOTE — Assessment & Plan Note (Signed)
The current medical regimen is effective;  continue present plan and medications.  

## 2018-03-13 NOTE — Progress Notes (Signed)
BP 139/90   Pulse 87   Ht 5\' 10"  (1.778 m)   Wt 280 lb (127 kg)   SpO2 98%   BMI 40.18 kg/m    Subjective:    Patient ID: Ernest [name], male    DOB: 1951-06-11, 67 y.o.   MRN: [id number]  HPI: Ernest [name] is a 67 y.o. male  Chief Complaint  Patient presents with  . Annual Exam  . Fall    Pt fell yesterday at work, got tangled in a water hose and fell. Hurt Right shoulder.   in arm splint from fall yesterday doing okay. Hemoptysis has stopped for now.  Reviewed chest CT and no evidence of lung lesions. Patient's other multiple medical problems stable for now.  Has not gained weight has not been able to lose weight yet. Better with diabetes.  At least he is thinking about it.  Relevant past medical, surgical, family and social history reviewed and updated as indicated. Interim medical history since our last visit reviewed. Allergies and medications reviewed and updated.  Review of Systems  Constitutional: Negative.   HENT: Negative.   Eyes: Negative.   Respiratory: Negative.   Cardiovascular: Negative.   Gastrointestinal: Negative.   Endocrine: Negative.   Genitourinary: Negative.   Musculoskeletal: Negative.   Skin: Negative.   Allergic/Immunologic: Negative.   Neurological: Negative.   Hematological: Negative.   Psychiatric/Behavioral: Negative.     Per HPI unless specifically indicated above     Objective:    BP 139/90   Pulse 87   Ht 5\' 10"  (1.778 m)   Wt 280 lb (127 kg)   SpO2 98%   BMI 40.18 kg/m   Wt Readings from Last 3 Encounters:  03/13/18 280 lb (127 kg)  02/21/18 280 lb 5 oz (127.1 kg)  02/19/18 277 lb 3.2 oz (125.7 kg)    Physical Exam  Constitutional: He is oriented to person, place, and time. He appears well-developed and well-nourished.  HENT:  Head: Normocephalic and atraumatic.  Right Ear: External ear normal.  Left Ear: External ear normal.  Eyes: Pupils are equal, round, and reactive to light. Conjunctivae and EOM  are normal.  Neck: Normal range of motion. Neck supple.  Cardiovascular: Normal rate, regular rhythm, normal heart sounds and intact distal pulses.  Pulmonary/Chest: Effort normal and breath sounds normal.  Abdominal: Soft. Bowel sounds are normal. There is no splenomegaly or hepatomegaly.  Genitourinary: Rectum normal, prostate normal and penis normal.  Musculoskeletal: Normal range of motion.  Right arm in sling  Neurological: He is alert and oriented to person, place, and time. He has normal reflexes.  Skin: No rash noted. No erythema.  Psychiatric: He has a normal mood and affect. His behavior is normal. Judgment and thought content normal.    Results for orders placed or performed in visit on 02/21/18  CBC with Differential/Platelet  Result Value Ref Range   WBC 8.7 3.4 - 10.8 x10E3/uL   RBC 5.11 4.14 - 5.80 x10E6/uL   Hemoglobin 12.9 (L) 13.0 - 17.7 g/dL   Hematocrit 40.0 37.5 - 51.0 %   MCV 78 (L) 79 - 97 fL   MCH 25.2 (L) 26.6 - 33.0 pg   MCHC 32.3 31.5 - 35.7 g/dL   RDW 16.1 (H) 12.3 - 15.4 %   Platelets 227 150 - 379 x10E3/uL   Neutrophils 48 Not Estab. %   Lymphs 44 Not Estab. %   Monocytes 5 Not Estab. %   Eos 2 Not  Estab. %   Basos 1 Not Estab. %   Neutrophils Absolute 4.2 1.4 - 7.0 x10E3/uL   Lymphocytes Absolute 3.9 (H) 0.7 - 3.1 x10E3/uL   Monocytes Absolute 0.4 0.1 - 0.9 x10E3/uL   EOS (ABSOLUTE) 0.1 0.0 - 0.4 x10E3/uL   Basophils Absolute 0.0 0.0 - 0.2 x10E3/uL   Immature Granulocytes 0 Not Estab. %   Immature Grans (Abs) 0.0 0.0 - 0.1 x10E3/uL  CoaguChek XS/INR Waived  Result Value Ref Range   INR 1.1 0.9 - 1.1   Prothrombin Time 13.3 sec  QuantiFERON-TB Gold Plus  Result Value Ref Range   QuantiFERON Incubation Incubation performed.    QuantiFERON Criteria Comment    QuantiFERON TB1 Ag Value 0.02 IU/mL   QuantiFERON TB2 Ag Value 0.02 IU/mL   QuantiFERON Nil Value 0.02 IU/mL   QuantiFERON Mitogen Value >10.00 IU/mL   QuantiFERON-TB Gold Plus  Negative Negative  Basic metabolic panel  Result Value Ref Range   Glucose 168 (H) 65 - 99 mg/dL   BUN 17 8 - 27 mg/dL   Creatinine, Ser 0.96 0.76 - 1.27 mg/dL   GFR calc non Af Amer 81 >59 mL/min/1.73   GFR calc Af Amer 94 >59 mL/min/1.73   BUN/Creatinine Ratio 18 10 - 24   Sodium 141 134 - 144 mmol/L   Potassium 4.1 3.5 - 5.2 mmol/L   Chloride 100 96 - 106 mmol/L   CO2 25 20 - 29 mmol/L   Calcium 9.7 8.6 - 10.2 mg/dL      Assessment & Plan:   Problem List Items Addressed This Visit      Cardiovascular and Mediastinum   Atrial fibrillation (HCC)    The current medical regimen is effective;  continue present plan and medications.       Relevant Medications   rivaroxaban (XARELTO) 20 MG TABS tablet   ramipril (ALTACE) 5 MG capsule   furosemide (LASIX) 40 MG tablet   Essential hypertension    The current medical regimen is effective;  continue present plan and medications.       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

## 2018-03-13 NOTE — Assessment & Plan Note (Signed)
A voluntary discussion about advance care planning including the explanation and discussion of advance directives was extensively discussed  with the patient.  Explanation about the health care proxy and Living will was reviewed and packet with forms with explanation of how to fill them out was given.    Time spent:   Encounter 16+ min     Individuals present pt  

## 2018-03-13 NOTE — Assessment & Plan Note (Signed)
Stable for now and taking iron tablets

## 2018-03-14 ENCOUNTER — Ambulatory Visit
Admit: 2018-03-14 | Discharge: 2018-03-14 | Disposition: A | Discharge status: Home / Self Care | Payer: Medicare Other | Attending: Emergency Medicine | Admitting: Emergency Medicine

## 2018-03-14 ENCOUNTER — Ambulatory Visit: Payer: Self-pay | Admitting: *Deleted

## 2018-03-14 ENCOUNTER — Encounter: Payer: Self-pay | Admitting: *Deleted

## 2018-03-14 ENCOUNTER — Ambulatory Visit
Admission: EM | Admit: 2018-03-14 | Discharge: 2018-03-14 | Disposition: A | Discharge status: Home / Self Care | Payer: Medicare Other | Attending: Family Medicine | Admitting: Family Medicine

## 2018-03-14 DIAGNOSIS — I2510 Atherosclerotic heart disease of native coronary artery without angina pectoris: Secondary | ICD-10-CM | POA: Diagnosis not present

## 2018-03-14 DIAGNOSIS — Z79899 Other long term (current) drug therapy: Secondary | ICD-10-CM | POA: Insufficient documentation

## 2018-03-14 DIAGNOSIS — J45909 Unspecified asthma, uncomplicated: Secondary | ICD-10-CM | POA: Insufficient documentation

## 2018-03-14 DIAGNOSIS — R42 Dizziness and giddiness: Secondary | ICD-10-CM | POA: Insufficient documentation

## 2018-03-14 DIAGNOSIS — Z8249 Family history of ischemic heart disease and other diseases of the circulatory system: Secondary | ICD-10-CM | POA: Diagnosis not present

## 2018-03-14 DIAGNOSIS — Z7984 Long term (current) use of oral hypoglycemic drugs: Secondary | ICD-10-CM | POA: Diagnosis not present

## 2018-03-14 DIAGNOSIS — Z87891 Personal history of nicotine dependence: Secondary | ICD-10-CM | POA: Insufficient documentation

## 2018-03-14 DIAGNOSIS — Z833 Family history of diabetes mellitus: Secondary | ICD-10-CM | POA: Insufficient documentation

## 2018-03-14 DIAGNOSIS — R51 Headache: Secondary | ICD-10-CM | POA: Diagnosis present

## 2018-03-14 DIAGNOSIS — Z811 Family history of alcohol abuse and dependence: Secondary | ICD-10-CM | POA: Diagnosis not present

## 2018-03-14 DIAGNOSIS — E785 Hyperlipidemia, unspecified: Secondary | ICD-10-CM | POA: Insufficient documentation

## 2018-03-14 DIAGNOSIS — G4733 Obstructive sleep apnea (adult) (pediatric): Secondary | ICD-10-CM | POA: Diagnosis not present

## 2018-03-14 DIAGNOSIS — K219 Gastro-esophageal reflux disease without esophagitis: Secondary | ICD-10-CM | POA: Insufficient documentation

## 2018-03-14 DIAGNOSIS — D509 Iron deficiency anemia, unspecified: Secondary | ICD-10-CM | POA: Insufficient documentation

## 2018-03-14 DIAGNOSIS — N400 Enlarged prostate without lower urinary tract symptoms: Secondary | ICD-10-CM | POA: Insufficient documentation

## 2018-03-14 DIAGNOSIS — S0231XA Fracture of orbital floor, right side, initial encounter for closed fracture: Secondary | ICD-10-CM | POA: Diagnosis not present

## 2018-03-14 DIAGNOSIS — W01198A Fall on same level from slipping, tripping and stumbling with subsequent striking against other object, initial encounter: Secondary | ICD-10-CM | POA: Diagnosis not present

## 2018-03-14 DIAGNOSIS — I129 Hypertensive chronic kidney disease with stage 1 through stage 4 chronic kidney disease, or unspecified chronic kidney disease: Secondary | ICD-10-CM | POA: Diagnosis not present

## 2018-03-14 DIAGNOSIS — Z818 Family history of other mental and behavioral disorders: Secondary | ICD-10-CM | POA: Diagnosis not present

## 2018-03-14 DIAGNOSIS — E1122 Type 2 diabetes mellitus with diabetic chronic kidney disease: Secondary | ICD-10-CM | POA: Insufficient documentation

## 2018-03-14 DIAGNOSIS — W06XXXA Fall from bed, initial encounter: Secondary | ICD-10-CM | POA: Diagnosis not present

## 2018-03-14 DIAGNOSIS — N183 Chronic kidney disease, stage 3 (moderate): Secondary | ICD-10-CM | POA: Diagnosis not present

## 2018-03-14 DIAGNOSIS — Z7901 Long term (current) use of anticoagulants: Secondary | ICD-10-CM | POA: Diagnosis not present

## 2018-03-14 DIAGNOSIS — I4891 Unspecified atrial fibrillation: Secondary | ICD-10-CM | POA: Diagnosis not present

## 2018-03-14 DIAGNOSIS — S0993XA Unspecified injury of face, initial encounter: Secondary | ICD-10-CM | POA: Diagnosis not present

## 2018-03-14 DIAGNOSIS — S0083XA Contusion of other part of head, initial encounter: Secondary | ICD-10-CM | POA: Diagnosis not present

## 2018-03-14 DIAGNOSIS — G2581 Restless legs syndrome: Secondary | ICD-10-CM | POA: Diagnosis not present

## 2018-03-14 DIAGNOSIS — S0990XA Unspecified injury of head, initial encounter: Secondary | ICD-10-CM | POA: Diagnosis not present

## 2018-03-14 NOTE — ED Provider Notes (Signed)
[facility]    CSN: [id number] Arrival date & time: 03/14/18  1027     History   Chief Complaint Chief Complaint  Patient presents with  . Fall  . Facial Pain  . Dizziness    HPI Ernest [name] is a 67 y.o. male.   HPI 67 year old male presents with a trauma to his right eye  with a hematoma under his right eye, a small laceration just adjacent to the nose bridge.  States that about 2 AM this morning he went  to lie down in bed when he became very dizzy and in order to abate the dizziness  sat up, lost his balance fell out of the bed striking his right eye on a corner of a [employer].  The patient is taking Xarelto and had difficulty stopping the bleeding from his eye as well as inside his nose in the right nares.  Does not have any recollection of loss of consciousness.  Now he presents with a large hematoma of the infraorbital and lateral portion of his eye, scleral hemorrhage small laceration on the bridge of his nose on the right.  The nosebleed has stopped.  He has swelling on that side of the face along with tenderness; no palpable deformity is seen.  He is oriented to 3.  He has not noticed any vision changes and specifically has had no double vision.  He has a shoulder injury on the right from an industrial injury at work.              Past Medical History:  Diagnosis Date  . Asthma   . Diabetes mellitus    Type II  . GERD (gastroesophageal reflux disease)   . Hyperlipidemia   . Hypertension     Patient Active Problem List   Diagnosis Date Noted  . Advanced care planning/counseling discussion 03/13/2018  . Anemia, unspecified 02/05/2018  . Pericardial calcification 02/07/2017  . Calcification of abdominal aorta (HCC) 02/07/2017  . Cough with hemoptysis 01/30/2017  . GERD (gastroesophageal reflux disease) 11/25/2015  . Restless legs 11/25/2015  . BPH (benign prostatic hyperplasia) 11/25/2015  . Type 2 DM with CKD stage 3 and  hypertension ([place]) 07/06/2015  . OSA on CPAP 08/25/2014  . BMI 40.0-44.9, adult ([place]) 08/25/2014  . Essential hypertension 08/11/2014  . Atypical chest pain 09/20/2010  . CAD, NATIVE VESSEL 06/16/2010  . Hyperlipidemia 06/03/2010  . Atrial fibrillation ([place]) 06/03/2010    Past Surgical History:  Procedure Laterality Date  . ANKLE SURGERY    . CARDIAC CATHETERIZATION  05/2010   [facility]  . CARDIAC CATHETERIZATION     [facility]  . CARDIAC CATHETERIZATION     [facility]  . CARDIAC CATHETERIZATION  07/2014   [facility]  . TESTICLE SURGERY  70's  . TUMOR EXCISION     Neck and finger; benign  . WRIST SURGERY         Home Medications    Prior to Admission medications   Medication Sig Start Date End Date Taking? Authorizing Provider  atorvastatin (LIPITOR) 40 MG tablet TAKE 1 TABLET BY MOUTH  DAILY AT 6 PM. 02/14/18  Yes Crissman, Jeannette How, MD  diltiazem (CARDIZEM SR) 120 MG 12 hr capsule Take 1 capsule (120 mg total) by mouth 2 (two) times daily. 03/28/17  Yes Minna Merritts, MD  ferrous sulfate 325 (65 FE) MG tablet Take 1 tablet (325 mg total) by mouth daily with breakfast. 02/06/18  Yes Crissman, Jeannette How, MD  furosemide (LASIX) 40 MG tablet Take 0.5 tablets (20 mg total) by mouth 2 (two) times daily. 03/13/18  Yes Crissman, Jeannette How, MD  gabapentin (NEURONTIN) 600 MG tablet TAKE 2 TABLETS BY MOUTH AT  BEDTIME 02/14/18  Yes Crissman, Mark A, MD  glipiZIDE (GLUCOTROL) 5 MG tablet TAKE 1 TABLET BY MOUTH  DAILY BEFORE BREAKFAST 02/26/18  Yes Crissman, Jeannette How, MD  isosorbide mononitrate (IMDUR) 60 MG 24 hr tablet TAKE 1 TABLET BY MOUTH  DAILY 02/26/18  Yes Crissman, Jeannette How, MD  metFORMIN (GLUCOPHAGE) 500 MG tablet Take 2 tablets (1,000 mg total) by mouth 2 (two) times daily. 03/13/18  Yes Crissman, Jeannette How, MD  omeprazole (PRILOSEC) 20 MG capsule Take 20 mg by mouth 2 (two) times daily before a meal.    Yes [provider]  ramipril (ALTACE) 5 MG capsule Take 1 capsule (5 mg total) by mouth daily. 03/13/18   Yes Crissman, Jeannette How, MD  rivaroxaban (XARELTO) 20 MG TABS tablet Take 1 tablet (20 mg total) by mouth daily with supper. 03/13/18  Yes Crissman, Jeannette How, MD  clotrimazole-betamethasone (LOTRISONE) cream Apply 1 application topically 2 (two) times daily. 08/01/17   Guadalupe Maple, MD    Family History Family History  Problem Relation Age of Onset  . Hypertension Mother   . Hyperlipidemia Mother   . Diabetes Mother   . Heart disease Mother        CABG  . Pneumonia Father        rare type  . Diabetes Other   . Depression Other   . Coronary artery disease Other   . Alcohol abuse Other   . Hypertension Other   . Hyperlipidemia Other   . Cancer Brother   . Kidney Stones Brother     Social History Social History   Tobacco Use  . Smoking status: Former Smoker    Packs/day: 4.00    Years: 28.00    Pack years: 112.00    Types: Cigarettes    Last attempt to quit: 08/25/1993    Years since quitting: 24.5  . Smokeless tobacco: Never Used  Substance Use Topics  . Alcohol use: No  . Drug use: No     Allergies   Patient has no known allergies.   Review of Systems Review of Systems  Constitutional: Positive for activity change. Negative for chills, fatigue and fever.  HENT: Positive for facial swelling.   Eyes: Positive for pain and redness. Negative for photophobia and visual disturbance.  All other systems reviewed and are negative.    Physical Exam Triage Vital Signs ED Triage Vitals  Enc Vitals Group     BP 03/14/18 1114 (!) 161/88     Pulse Rate 03/14/18 1114 86     Resp 03/14/18 1114 16     Temp 03/14/18 1114 98.2 F (36.8 C)     Temp Source 03/14/18 1114 Oral     SpO2 03/14/18 1114 96 %     Weight 03/14/18 1117 280 lb (127 kg)     Height 03/14/18 1117 5\' 10"  (1.778 m)     Head Circumference --      Peak Flow --      Pain Score 03/14/18 1117 8     Pain Loc --      Pain Edu? --      Excl. in [place]? --    No data found.  Updated Vital Signs BP (!) 161/88  (BP Location: Left Arm)   Pulse  86   Temp 98.2 F (36.8 C) (Oral)   Resp 16   Ht 5\' 10"  (1.778 m)   Wt 280 lb (127 kg)   SpO2 96%   BMI 40.18 kg/m   Visual Acuity Right Eye Distance: 20/40 Left Eye Distance: 20/25 Bilateral Distance: 20/20  Right Eye Near:   Left Eye Near:    Bilateral Near:     Physical Exam  Constitutional: He is oriented to person, place, and time. He appears well-developed and well-nourished.  HENT:  Head: Normocephalic.  Right Ear: External ear normal.  Left Ear: External ear normal.  Mouth/Throat: Oropharynx is clear and moist.  Eyes: Pupils are equal, round, and reactive to light. Right eye exhibits no discharge. Left eye exhibits no discharge.  PERRLA.  Show a questionable lag vertical EOM  Compared to the uninvolved left eye.  Large hematoma infra orbital and  lateral right eye.  There is tenderness and swelling in this area.  Small laceration on the bridge of the nose on the right not requiring sutures.  There is scleral hemorrhage does not affect  the iris.  Visual acuity is 20/40 right eye 20/25 left eye 20/20 bilateral.  No crepitus of the orbit.  He does have tenderness along the heel aspect of the infraorbital as well as the lateral where the hematoma is largest.  Lateral gaze to the right does cause him to have pain in the eye that he localizes over the lateral canthus area.  Neck: Normal range of motion. Neck supple.  Is no tenderness to palpation of the cervical spine.  There is no deformities palpable of the skull.  Pulmonary/Chest: Effort normal and breath sounds normal.  Musculoskeletal: Normal range of motion.  Patient's right arm is in a sling from previous injury  Neurological: He is alert and oriented to person, place, and time.  Skin: Skin is warm and dry.  Psychiatric: He has a normal mood and affect. His behavior is normal. Judgment and thought content normal.  Nursing note and vitals reviewed.        UC Treatments / Results    Labs (all labs ordered are listed, but only abnormal results are displayed) Labs Reviewed - No data to display  EKG None Radiology Ct Head Wo Contrast  Result Date: 03/14/2018 CLINICAL DATA:  Head trauma on anti coagulation. EXAM: CT HEAD WITHOUT CONTRAST CT MAXILLOFACIAL WITHOUT CONTRAST TECHNIQUE: Multidetector CT imaging of the head and maxillofacial structures were performed using the standard protocol without intravenous contrast. Multiplanar CT image reconstructions of the maxillofacial structures were also generated. COMPARISON:  None. FINDINGS: CT HEAD FINDINGS Brain: No mass lesion, intraparenchymal hemorrhage or extra-axial collection. No evidence of acute cortical infarct. Brain parenchyma and CSF-containing spaces are normal for age. Vascular: No hyperdense vessel or unexpected calcification. Skull: No calvarial fracture. Normal skull base. CT MAXILLOFACIAL FINDINGS Osseous: --Complex facial fracture types: No LeFort, zygomaticomaxillary complex or nasoorbitoethmoidal fracture. --Simple fracture types: Minimally displaced fracture of the floor of the right orbit that traverses the infraorbital foramen. No herniation of the extraocular muscles. Intraconal structures are normal. --Mandible, hard palate and teeth: No acute abnormality. Orbits: The globes are intact. Normal appearance of the intra- and extraconal fat. Symmetric extraocular muscles. Fracture of the right orbital floor as above. Sinuses: Mild mucosal thickening in and probable blood products in the right maxillary sinus. Soft tissues: Small right periorbital hematoma. IMPRESSION: 1. No intracranial abnormality. 2. Fracture of the right orbital floor, traversing the infraorbital foramen, with small associated  periorbital hematoma. Electronically Signed   By: Ulyses Jarred M.D.   On: 03/14/2018 13:40   Ct Maxillofacial Wo Contrast  Result Date: 03/14/2018 CLINICAL DATA:  Head trauma on anti coagulation. EXAM: CT HEAD WITHOUT  CONTRAST CT MAXILLOFACIAL WITHOUT CONTRAST TECHNIQUE: Multidetector CT imaging of the head and maxillofacial structures were performed using the standard protocol without intravenous contrast. Multiplanar CT image reconstructions of the maxillofacial structures were also generated. COMPARISON:  None. FINDINGS: CT HEAD FINDINGS Brain: No mass lesion, intraparenchymal hemorrhage or extra-axial collection. No evidence of acute cortical infarct. Brain parenchyma and CSF-containing spaces are normal for age. Vascular: No hyperdense vessel or unexpected calcification. Skull: No calvarial fracture. Normal skull base. CT MAXILLOFACIAL FINDINGS Osseous: --Complex facial fracture types: No LeFort, zygomaticomaxillary complex or nasoorbitoethmoidal fracture. --Simple fracture types: Minimally displaced fracture of the floor of the right orbit that traverses the infraorbital foramen. No herniation of the extraocular muscles. Intraconal structures are normal. --Mandible, hard palate and teeth: No acute abnormality. Orbits: The globes are intact. Normal appearance of the intra- and extraconal fat. Symmetric extraocular muscles. Fracture of the right orbital floor as above. Sinuses: Mild mucosal thickening in and probable blood products in the right maxillary sinus. Soft tissues: Small right periorbital hematoma. IMPRESSION: 1. No intracranial abnormality. 2. Fracture of the right orbital floor, traversing the infraorbital foramen, with small associated periorbital hematoma. Electronically Signed   By: Ulyses Jarred M.D.   On: 03/14/2018 13:40    Procedures Procedures (including critical care time)  Medications Ordered in UC Medications - No data to display   Initial Impression / Assessment and Plan / UC Course  I have reviewed the triage vital signs and the nursing notes.  Pertinent labs & imaging results that were available during my care of the patient were reviewed by me and considered in my medical decision  making (see chart for details).   We contacted ENT maxillofacial surgeon on call ,Dr. Constance Holster.  Was advised through his office that the patient could wait 5-7 days and see him in his office.  This was arranged and all information given to the patient so that he may make the appointment.  In addition we have arranged an appointment with [facility] tomorrow in the [facility] office to see Dr. Edison Pace, ophthalmologist.  He develops any double vision or loss of vision any changes increased bleeding etc. he should go immediately to the emergency room.  He understands.  He left our office in stable condition.  Friend of his came to drive him home.  And has tramadol at home that he may take for pain.   Final Clinical Impressions(s) / UC Diagnoses   Final diagnoses:  Closed fracture of right orbital floor, initial encounter [facility])  Hematoma of face, initial encounter    ED Discharge Orders        Ordered    CT HEAD WO CONTRAST     03/14/18 1303    CT MAXILLOFACIAL WO CONTRAST     03/14/18 1303       Controlled Substance Prescriptions  Controlled Substance Registry consulted? Not Applicable   Lorin Picket, PA-C 03/14/18 2037

## 2018-03-14 NOTE — Discharge Instructions (Addendum)
Use ice on the eye 20 minutes out of every 2 hours 4-5 times daily.  Make sure you follow-up with the ear nose and throat physician.  If you have any change in your vision such as double vision or loss of vision go to the emergency room immediately.

## 2018-03-14 NOTE — ED Triage Notes (Signed)
Hx of dizziness. Pt became dizzy while in bed last night and fell out of bed. Now c/o right facial pain, hematoma under right eye with small laceration near nose under right eye.

## 2018-03-14 NOTE — ED Triage Notes (Signed)
[employer] Complete for prior authorization of CT head and CT face without contrast. Spoke with Marjory Lies A. No prior auth required.

## 2018-03-14 NOTE — Telephone Encounter (Signed)
Pt called stating that he fell and hit is face last night; he has a split on the corner of his eye and on his black bubble under his eye, he also has a black eye, and his face is swollen and right eye are swollen; he also states that he takes xarelto; recommendations made per nurse protocol; also spoke with [place] [facility] and confirmed that pt should go to urgent care; he states that he will proceed to urgent care; will route to office for notification of this encounter and pt triaged to ED/urgent care.   Reason for Disposition . Taking Coumadin (warfarin) or other strong blood thinner, or known bleeding disorder (e.g., thrombocytopenia)  Answer Assessment - Initial Assessment Questions 1. MECHANISM: "How did the injury happen?"      03/14/18 0200 2. ONSET: "When did the injury happen?" (Minutes or hours ago)     hours 3. LOCATION: "What part of the face is injured?"     Right eye swollen closed, split lips, facial swelling 4. APPEARANCE of INJURY: "What does the face look like?"     Big black bubble 5. BLEEDING: "Is it bleeding now?" If so, ask, "Is it difficult to stop?"     no 6. PAIN: "Is there pain?" If so, ask: "How bad is the pain?"  (e.g., Scale 1-10; or mild, moderate, severe)     Moderate to severe 7. SIZE: For cuts, bruises, or swelling, ask: "How large is it?" (e.g., inches or centimeters)      1/4 - 1/2 inch 8. TETANUS: For any breaks in the skin, ask: "When was the last tetanus booster?"     unsure 9. OTHER SYMPTOMS: "Do you have any other symptoms?" (e.g., neck pain, headache, loss of consciousness)     no 10. PREGNANCY: "Is there any chance you are pregnant?" "When was your last menstrual period?"      n/a  Protocols used: FACE INJURY-A-AH

## 2018-03-15 DIAGNOSIS — S0281XA Fracture of other specified skull and facial bones, right side, initial encounter for closed fracture: Secondary | ICD-10-CM | POA: Diagnosis not present

## 2018-03-19 DIAGNOSIS — S0230XA Fracture of orbital floor, unspecified side, initial encounter for closed fracture: Secondary | ICD-10-CM | POA: Diagnosis not present

## 2018-04-18 ENCOUNTER — Institutional Professional Consult (permissible substitution): Payer: Medicare Other | Admitting: Internal Medicine

## 2018-04-19 DIAGNOSIS — E119 Type 2 diabetes mellitus without complications: Secondary | ICD-10-CM | POA: Diagnosis not present

## 2018-04-19 LAB — HM DIABETES EYE EXAM

## 2018-04-22 ENCOUNTER — Other Ambulatory Visit: Payer: Self-pay

## 2018-04-22 ENCOUNTER — Encounter: Payer: Self-pay | Admitting: Gastroenterology

## 2018-04-22 ENCOUNTER — Ambulatory Visit: Payer: Medicare Other | Admitting: Gastroenterology

## 2018-04-22 VITALS — BP 149/92 | HR 114 | Temp 97.7°F | Ht 70.0 in | Wt 276.0 lb

## 2018-04-22 DIAGNOSIS — K760 Fatty (change of) liver, not elsewhere classified: Secondary | ICD-10-CM

## 2018-04-22 DIAGNOSIS — D500 Iron deficiency anemia secondary to blood loss (chronic): Secondary | ICD-10-CM

## 2018-04-22 MED ORDER — PEG 3350-KCL-NABCB-NACL-NASULF 236 G PO SOLR: 4000.0000 mL | Freq: Once | ORAL | 0 refills | Status: AC

## 2018-04-22 MED ORDER — BISACODYL 5 MG PO TBEC: 10.0000 mg | DELAYED_RELEASE_TABLET | Freq: Once | ORAL | 0 refills | Status: AC

## 2018-04-22 NOTE — Progress Notes (Signed)
[name], MD [address]  [place]  [place], [place] [address]  Main: [phone]  Fax: [phone]   Primary Care Physician: Guadalupe Maple, MD  Primary Gastroenterologist:  Dr. [name]  Chief Complaint  Patient presents with  . Follow-up    Iron Def. Anemia, coughing blood    HPI: Ernest [name] is a 67 y.o. male here for follow-up of iron deficiency anemia.  Patient was initially seen on February 19, 2018.  Plan was to schedule for EGD and colonoscopy, once he is cleared by primary care pulmonology, as he was complaining of hemoptysis.  The hemoptysis had resolved at this time.  Primary care physician ordered a CT chest that did not show any significant abnormalities to explain his hemoptysis.  He has a pulmonology appointment in this regard in 2 weeks.  Denies any recent blood in stool, or melena.  No abdominal pain.  No heartburn or dysphagia.  No weight loss.  Patient was also recently in the ER on March 14, 2018, with a right thigh hematoma after a fall.  Patient states he has seen surgery since then, and no surgical intervention is planned.  As per previous notes: Patient had a colonoscopy and EGD in March 2015 by Dr. Filiberto Norris for iron deficiency anemia and heme positive stool.  EGD was reported to be normal.  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  Treated with APC.  Colonoscopy prep was noted to be poor.  Patient also had a colonoscopy for heme positive stool and EGD for heartburn in September 2013 by Dr. Candace Cruise.  Single sigmoid polyp was removed with cold snare during the colonoscopy.  Pathology report showed fibroblastic polyp EGD was normal.     Current Outpatient Medications  Medication Sig Dispense Refill  . atorvastatin (LIPITOR) 40 MG tablet TAKE 1 TABLET BY MOUTH  DAILY AT 6 PM. 90 tablet 4  . clotrimazole-betamethasone (LOTRISONE) cream Apply 1 application topically 2 (two) times daily. 30 g 0    . diltiazem (CARDIZEM SR) 120 MG 12 hr capsule Take 1 capsule (120 mg total) by mouth 2 (two) times daily. 180 capsule 3  . ferrous sulfate 325 (65 FE) MG tablet Take 1 tablet (325 mg total) by mouth daily with breakfast. 90 tablet 2  . furosemide (LASIX) 40 MG tablet Take 0.5 tablets (20 mg total) by mouth 2 (two) times daily. 180 tablet 4  . gabapentin (NEURONTIN) 600 MG tablet TAKE 2 TABLETS BY MOUTH AT  BEDTIME 180 tablet 4  . glipiZIDE (GLUCOTROL) 5 MG tablet TAKE 1 TABLET BY MOUTH  DAILY BEFORE BREAKFAST 90 tablet 4  . isosorbide mononitrate (IMDUR) 60 MG 24 hr tablet TAKE 1 TABLET BY MOUTH  DAILY 90 tablet 4  . metFORMIN (GLUCOPHAGE) 500 MG tablet Take 2 tablets (1,000 mg total) by mouth 2 (two) times daily. 360 tablet 4  . omeprazole (PRILOSEC) 20 MG capsule Take 20 mg by mouth 2 (two) times daily before a meal.     . ramipril (ALTACE) 5 MG capsule Take 1 capsule (5 mg total) by mouth daily. 90 capsule 4  . rivaroxaban (XARELTO) 20 MG TABS tablet Take 1 tablet (20 mg total) by mouth daily with supper. 30 tablet 7   No current facility-administered medications for this visit.     Allergies as of 04/22/2018  . (No Known Allergies)    ROS:  General: Negative for anorexia, weight loss, fever, chills, fatigue, weakness. ENT: Negative for  hoarseness, difficulty swallowing , nasal congestion. CV: Negative for chest pain, angina, palpitations, dyspnea on exertion, peripheral edema.  Respiratory: Negative for dyspnea at rest, dyspnea on exertion, cough, sputum, wheezing.  GI: See history of present illness. GU:  Negative for dysuria, hematuria, urinary incontinence, urinary frequency, nocturnal urination.  Endo: Negative for unusual weight change.    Physical Examination:   BP (!) 149/92   Pulse (!) 114   Temp 97.7 F (36.5 C) (Oral)   Ht 5\' 10"  (1.778 m)   Wt 276 lb (125.2 kg)   BMI 39.60 kg/m   General: Well-nourished, well-developed in no acute distress.  Eyes: No icterus.  Conjunctivae pink. Mouth: Oropharyngeal mucosa moist and pink , no lesions erythema or exudate. Neck: Supple, Trachea midline Abdomen: Bowel sounds are normal, nontender, obese, distended abdomen, no hepatosplenomegaly or masses, no abdominal bruits or hernia , no rebound or guarding.   Extremities: No lower extremity edema. No clubbing or deformities. Neuro: Alert and oriented x 3.  Grossly intact. Skin: Warm and dry, no jaundice.   Psych: Alert and cooperative, normal mood and affect.   Labs: CMP     Component Value Date/Time   NA 141 02/21/2018 1533   NA 139 07/25/2014 0425   K 4.1 02/21/2018 1533   K 3.7 07/25/2014 0425   CL 100 02/21/2018 1533   CL 105 07/25/2014 0425   CO2 25 02/21/2018 1533   CO2 29 07/25/2014 0425   GLUCOSE 168 (H) 02/21/2018 1533   GLUCOSE 125 (H) 07/25/2014 0425   BUN 17 02/21/2018 1533   BUN 16 07/25/2014 0425   CREATININE 0.96 02/21/2018 1533   CREATININE 1.21 07/25/2014 0425   CALCIUM 9.7 02/21/2018 1533   CALCIUM 8.9 07/25/2014 0425   PROT 6.8 02/01/2018 0846   ALBUMIN 4.3 02/01/2018 0846   AST 24 02/01/2018 0846   AST 34 08/01/2017 0902   ALT 23 02/01/2018 0846   ALT 33 08/01/2017 0902   ALKPHOS 83 02/01/2018 0846   BILITOT 0.7 02/01/2018 0846   GFRNONAA 81 02/21/2018 1533   GFRNONAA >60 07/25/2014 0425   GFRAA 94 02/21/2018 1533   GFRAA >60 07/25/2014 0425   Lab Results  Component Value Date   WBC 8.7 02/21/2018   HGB 12.9 (L) 02/21/2018   HCT 40.0 02/21/2018   MCV 78 (L) 02/21/2018   PLT 227 02/21/2018    Imaging Studies: No results found.  Assessment and Plan:   Ernest [name] is a 67 y.o. y/o male here for follow-up of iron deficiency anemia  Iron deficiency anemia is chronic, and is likely due to underlying AVMs Hemoptysis is resolved, and CT chest did not show any abnormalities attributing to hemoptysis.  Continue to follow with pulmonology to complete work-up We will schedule for EGD and colonoscopy as hemoptysis  is resolved Will need cardiology clearance about his Xarelto prior to procedure Will refer to hematology to evaluate if he is a candidate for IV iron transfusions  Weight loss diet and exercise encouraged, as CT chest reported fatty liver  No signs of cirrhosis Liver enzymes were normal in February 2019   Dr [name]

## 2018-04-25 ENCOUNTER — Inpatient Hospital Stay: Payer: Medicare Other

## 2018-04-25 ENCOUNTER — Encounter: Payer: Self-pay | Admitting: Oncology

## 2018-04-25 ENCOUNTER — Telehealth: Payer: Self-pay

## 2018-04-25 ENCOUNTER — Other Ambulatory Visit: Payer: Self-pay

## 2018-04-25 ENCOUNTER — Inpatient Hospital Stay: Payer: Medicare Other | Attending: Oncology | Admitting: Oncology

## 2018-04-25 VITALS — BP 160/96 | HR 103 | Temp 98.5°F | Resp 18 | Ht 70.0 in | Wt 274.8 lb

## 2018-04-25 DIAGNOSIS — Z7901 Long term (current) use of anticoagulants: Secondary | ICD-10-CM | POA: Diagnosis not present

## 2018-04-25 DIAGNOSIS — Z7984 Long term (current) use of oral hypoglycemic drugs: Secondary | ICD-10-CM | POA: Diagnosis not present

## 2018-04-25 DIAGNOSIS — Z79899 Other long term (current) drug therapy: Secondary | ICD-10-CM | POA: Diagnosis not present

## 2018-04-25 DIAGNOSIS — I129 Hypertensive chronic kidney disease with stage 1 through stage 4 chronic kidney disease, or unspecified chronic kidney disease: Secondary | ICD-10-CM | POA: Insufficient documentation

## 2018-04-25 DIAGNOSIS — I4891 Unspecified atrial fibrillation: Secondary | ICD-10-CM | POA: Diagnosis not present

## 2018-04-25 DIAGNOSIS — D500 Iron deficiency anemia secondary to blood loss (chronic): Secondary | ICD-10-CM

## 2018-04-25 DIAGNOSIS — K219 Gastro-esophageal reflux disease without esophagitis: Secondary | ICD-10-CM | POA: Insufficient documentation

## 2018-04-25 DIAGNOSIS — N183 Chronic kidney disease, stage 3 (moderate): Secondary | ICD-10-CM | POA: Insufficient documentation

## 2018-04-25 DIAGNOSIS — Z87891 Personal history of nicotine dependence: Secondary | ICD-10-CM | POA: Diagnosis not present

## 2018-04-25 DIAGNOSIS — E1122 Type 2 diabetes mellitus with diabetic chronic kidney disease: Secondary | ICD-10-CM | POA: Insufficient documentation

## 2018-04-25 DIAGNOSIS — E785 Hyperlipidemia, unspecified: Secondary | ICD-10-CM | POA: Diagnosis not present

## 2018-04-25 LAB — URINALYSIS, COMPLETE (UACMP) WITH MICROSCOPIC
Bacteria, UA: NONE SEEN
Bilirubin Urine: NEGATIVE
Glucose, UA: 50 mg/dL — AB
Ketones, ur: NEGATIVE mg/dL
Leukocytes, UA: NEGATIVE
Nitrite: NEGATIVE
Protein, ur: NEGATIVE mg/dL
Specific Gravity, Urine: 1.018 (ref 1.005–1.030)
pH: 5 (ref 5.0–8.0)

## 2018-04-25 LAB — CBC WITH DIFFERENTIAL/PLATELET
Basophils Absolute: 0.1 10*3/uL (ref 0–0.1)
Basophils Relative: 1 %
Eosinophils Absolute: 0.2 10*3/uL (ref 0–0.7)
Eosinophils Relative: 3 %
HCT: 43.2 % (ref 40.0–52.0)
Hemoglobin: 14.8 g/dL (ref 13.0–18.0)
Lymphocytes Relative: 45 %
Lymphs Abs: 3.3 10*3/uL (ref 1.0–3.6)
MCH: 27.3 pg (ref 26.0–34.0)
MCHC: 34.3 g/dL (ref 32.0–36.0)
MCV: 79.6 fL — ABNORMAL LOW (ref 80.0–100.0)
Monocytes Absolute: 0.4 10*3/uL (ref 0.2–1.0)
Monocytes Relative: 5 %
Neutro Abs: 3.5 10*3/uL (ref 1.4–6.5)
Neutrophils Relative %: 46 %
Platelets: 158 10*3/uL (ref 150–440)
RBC: 5.43 MIL/uL (ref 4.40–5.90)
RDW: 16.6 % — ABNORMAL HIGH (ref 11.5–14.5)
WBC: 7.4 10*3/uL (ref 3.8–10.6)

## 2018-04-25 LAB — FERRITIN: Ferritin: 75 ng/mL (ref 24–336)

## 2018-04-25 LAB — FOLATE: Folate: 9.8 ng/mL (ref >5.9)

## 2018-04-25 LAB — IRON AND TIBC
Iron: 57 ug/dL (ref 45–182)
Saturation Ratios: 14 % — ABNORMAL LOW (ref 17.9–39.5)
TIBC: 402 ug/dL (ref 250–450)
UIBC: 345 ug/dL

## 2018-04-25 LAB — VITAMIN B12: Vitamin B-12: 282 pg/mL (ref 180–914)

## 2018-04-25 NOTE — Telephone Encounter (Signed)
-----   Message from Sindy Guadeloupe, MD sent at 04/25/2018  3:13 PM EDT ----- Please have him take 1000 mcg po b12 daily

## 2018-04-25 NOTE — Progress Notes (Signed)
Hematology/Oncology Consult note [facility] [phone] [phone]  Patient Care Team: Guadalupe Maple, MD as PCP - General (Family Medicine)   Name of the patient: Ernest [name]  [id number]  [birth year]    Reason for referral- iron deficiency anemia   Referring physician- Dr. Bonna Gains  Date of visit: 04/25/18   History of presenting illness-patient is a 67 year old male who was recently seen by GI for iron deficiency anemia.  He was also found to have some hemoptysis in March 2019 and is being followed by pulmonary.  CT chest did not reveal any significant abnormalities.  He is on Xarelto for his A. fib.  He has had EGD and colonoscopy in 2013 and 2015.  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  EGD was normal.  He is scheduled to get a repeat endoscopy and colonoscopy by Dr. Bonna Gains after getting pulmonary clearance.  Also his Xarelto needs to be held prior to the procedure.  He has been referred to [place] for iron deficiency anemia.  He reports taking 1 iron pill daily.  Denies any consistent use of NSAIDs.  His stools have been dark since he has been on oral iron.  Denies any bright red blood in stools.  Recent CBC from 02/21/2018 showed white count of 8.7, H&H of 12.9/40 with an MCV of 78 and a platelet count of 227.  Patient's hemoglobin has been between 12-13 over the last 1 year.Last iron studies checked in February 2019 revealed a low normal ferritin of 35.  Iron studies showed an elevated TIBC of 535 and a low iron saturation of 8%  ECOG PS- 1  Pain scale- 0   Review of systems- Review of Systems  Constitutional: Positive for malaise/fatigue. Negative for chills, fever and weight loss.  HENT: Negative for congestion, ear discharge and nosebleeds.   Eyes: Negative for blurred vision.  Respiratory: Negative for cough, hemoptysis, sputum production,  shortness of breath and wheezing.   Cardiovascular: Negative for chest pain, palpitations, orthopnea and claudication.  Gastrointestinal: Negative for abdominal pain, blood in stool, constipation, diarrhea, heartburn, melena, nausea and vomiting.  Genitourinary: Negative for dysuria, flank pain, frequency, hematuria and urgency.  Musculoskeletal: Negative for back pain, joint pain and myalgias.  Skin: Negative for rash.  Neurological: Negative for dizziness, tingling, focal weakness, seizures, weakness and headaches.  Endo/Heme/Allergies: Does not bruise/bleed easily.  Psychiatric/Behavioral: Negative for depression and suicidal ideas. The patient does not have insomnia.     No Known Allergies  Patient Active Problem List   Diagnosis Date Noted  . Iron deficiency anemia due to chronic blood loss 04/25/2018  . Advanced care planning/counseling discussion 03/13/2018  . Anemia, unspecified 02/05/2018  . Pericardial calcification 02/07/2017  . Calcification of abdominal aorta (HCC) 02/07/2017  . Cough with hemoptysis 01/30/2017  . GERD (gastroesophageal reflux disease) 11/25/2015  . Restless legs 11/25/2015  . BPH (benign prostatic hyperplasia) 11/25/2015  . 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 OSA on CPAP 08/25/2014  . BMI 40.0-44.9, adult ([place]) 08/25/2014  . Essential hypertension 08/11/2014  . Atypical chest pain 09/20/2010  . CAD, NATIVE VESSEL 06/16/2010  . Hyperlipidemia 06/03/2010  . Atrial fibrillation ([place]) 06/03/2010     Past Medical History:  Diagnosis Date  . Asthma   . Diabetes mellitus    Type II  . GERD (gastroesophageal reflux disease)   . Hyperlipidemia   .  Hypertension      Past Surgical History:  Procedure Laterality Date  . ANKLE SURGERY    . CARDIAC CATHETERIZATION  05/2010   [facility]  . CARDIAC CATHETERIZATION     [facility]  . CARDIAC CATHETERIZATION     [facility]  . CARDIAC CATHETERIZATION  07/2014   [facility]  . TESTICLE SURGERY  70's    . TUMOR EXCISION     Neck and finger; benign  . WRIST SURGERY      Social History   Socioeconomic History  . Marital status: Widowed    Spouse name: Not on file  . Number of children: Not on file  . Years of education: Not on file  . Highest education level: Not on file  Occupational History  . Occupation: Full time  Social Needs  . Financial resource strain: Not hard at all  . Food insecurity:    Worry: Never true    Inability: Never true  . Transportation needs:    Medical: No    Non-medical: No  Tobacco Use  . Smoking status: Former Smoker    Packs/day: 4.00    Years: 28.00    Pack years: 112.00    Types: Cigarettes    Last attempt to quit: 08/25/1993    Years since quitting: 24.6  . Smokeless tobacco: Never Used  Substance and Sexual Activity  . Alcohol use: No  . Drug use: No  . Sexual activity: Not on file  Lifestyle  . Physical activity:    Days per week: 0 days    Minutes per session: 0 min  . Stress: Not at all  Relationships  . Social connections:    Talks on phone: More than three times a week    Gets together: More than three times a week    Attends religious service: Never    Active member of club or organization: No    Attends meetings of clubs or organizations: Never    Relationship status: Widowed  . Intimate partner violence:    Fear of current or ex partner: No    Emotionally abused: No    Physically abused: No    Forced sexual activity: No  Other Topics Concern  . Not on file  Social History Narrative   No regular exercise.     Family History  Problem Relation Age of Onset  . Hypertension Mother   . Hyperlipidemia Mother   . Diabetes Mother   . Heart disease Mother        CABG  . Pneumonia Father        rare type  . Diabetes Other   . Depression Other   . Coronary artery disease Other   . Alcohol abuse Other   . Hypertension Other   . Hyperlipidemia Other   . Cancer Brother   . Kidney Stones Brother      Current  Outpatient Medications:  .  atorvastatin (LIPITOR) 40 MG tablet, TAKE 1 TABLET BY MOUTH  DAILY AT 6 PM., Disp: 90 tablet, Rfl: 4 .  diltiazem (CARDIZEM SR) 120 MG 12 hr capsule, Take 1 capsule (120 mg total) by mouth 2 (two) times daily., Disp: 180 capsule, Rfl: 3 .  ferrous sulfate 325 (65 FE) MG tablet, Take 1 tablet (325 mg total) by mouth daily with breakfast., Disp: 90 tablet, Rfl: 2 .  furosemide (LASIX) 40 MG tablet, Take 0.5 tablets (20 mg total) by mouth 2 (two) times daily., Disp: 180 tablet, Rfl: 4 .  gabapentin (NEURONTIN)  600 MG tablet, TAKE 2 TABLETS BY MOUTH AT  BEDTIME, Disp: 180 tablet, Rfl: 4 .  glipiZIDE (GLUCOTROL) 5 MG tablet, TAKE 1 TABLET BY MOUTH  DAILY BEFORE BREAKFAST, Disp: 90 tablet, Rfl: 4 .  isosorbide mononitrate (IMDUR) 60 MG 24 hr tablet, TAKE 1 TABLET BY MOUTH  DAILY, Disp: 90 tablet, Rfl: 4 .  metFORMIN (GLUCOPHAGE) 500 MG tablet, Take 2 tablets (1,000 mg total) by mouth 2 (two) times daily., Disp: 360 tablet, Rfl: 4 .  omeprazole (PRILOSEC) 20 MG capsule, Take 20 mg by mouth 2 (two) times daily before a meal. , Disp: , Rfl:  .  ramipril (ALTACE) 5 MG capsule, Take 1 capsule (5 mg total) by mouth daily., Disp: 90 capsule, Rfl: 4 .  rivaroxaban (XARELTO) 20 MG TABS tablet, Take 1 tablet (20 mg total) by mouth daily with supper., Disp: 30 tablet, Rfl: 7 .  clotrimazole-betamethasone (LOTRISONE) cream, Apply 1 application topically 2 (two) times daily. (Patient not taking: Reported on 04/25/2018), Disp: 30 g, Rfl: 0   Physical exam:  Vitals:   04/25/18 0919  BP: (!) 160/96  Pulse: (!) 103  Resp: 18  Temp: 98.5 F (36.9 C)  TempSrc: Tympanic  SpO2: 95%  Weight: 274 lb 12.8 oz (124.6 kg)  Height: 5\' 10"  (1.778 m)   Physical Exam  Constitutional: He is oriented to person, place, and time.  Patient is obese.  Does not appear to be in any acute distress  HENT:  Head: Normocephalic and atraumatic.  Eyes: Pupils are equal, round, and reactive to light. EOM are  normal.  Neck: Normal range of motion.  Cardiovascular: Normal rate and normal heart sounds.  Irregular  Pulmonary/Chest: Effort normal and breath sounds normal.  Abdominal: Soft. Bowel sounds are normal.  Neurological: He is alert and oriented to person, place, and time.  Skin: Skin is warm and dry.       CMP Latest Ref Rng & Units 02/21/2018  Glucose 65 - 99 mg/dL 168(H)  BUN 8 - 27 mg/dL 17  Creatinine 0.76 - 1.27 mg/dL 0.96  Sodium 134 - 144 mmol/L 141  Potassium 3.5 - 5.2 mmol/L 4.1  Chloride 96 - 106 mmol/L 100  CO2 20 - 29 mmol/L 25  Calcium 8.6 - 10.2 mg/dL 9.7  Total Protein 6.0 - 8.5 g/dL -  Total Bilirubin 0.0 - 1.2 mg/dL -  Alkaline Phos 39 - 117 IU/L -  AST 0 - 40 IU/L -  ALT 0 - 44 IU/L -   CBC Latest Ref Rng & Units 04/25/2018  WBC 3.8 - 10.6 K/uL 7.4  Hemoglobin 13.0 - 18.0 g/dL 14.8  Hematocrit 40.0 - 52.0 % 43.2  Platelets 150 - 440 K/uL 158     Assessment and plan- Patient is a 67 y.o. male referred for iron deficiency anemia.  Today I will check repeat CBC, ferritin, iron studies B12 and folate.  He has mild microcytosis and mild anemia based on his recent labs in March 2019.  He has been on oral iron once a day for a while.  If repeat labs from today show evidence of iron deficiency I will proceed with 2 doses of Feraheme 510 mg IV weekly.  Discussed risks and benefits of Feraheme including all but not limited to headache, leg swelling, possible risk of infusion reaction.  Patient understands and agrees to proceed.  I will also check celiac disease panel, stool H. pylori antigen and urinalysis to complete his iron deficiency work-up  I will  see him back in 2 months time with repeat CBC ferritin and iron studies.   Thank you for this kind referral and the opportunity to participate in the care of this patient   Visit Diagnosis 1. Iron deficiency anemia due to chronic blood loss     Dr. [name], MD, MPH [facility] [phone] 04/25/2018

## 2018-04-25 NOTE — Telephone Encounter (Signed)
L/M on the patient voice mail, per Dr Janese Banks the patient b-12 level were low with labs today. Per Dr Janese Banks request the patient need to start B -12 1,000 mcg daily. I Will re call the patient to make sure he understands the message. I left a message to inform the patien if any question or concerns please feel free to contact Dr. Janese Banks office and speak with myself/ [name].

## 2018-04-25 NOTE — Telephone Encounter (Signed)
Pt called to reschedule colonoscopy/EGD on 06/04/18 due to change in provider schedule. Pt agreed to move to 6/19, Almyra Free in scheduling notified and to give Trish message in am.

## 2018-04-26 ENCOUNTER — Other Ambulatory Visit: Payer: Self-pay

## 2018-04-26 DIAGNOSIS — Z87891 Personal history of nicotine dependence: Secondary | ICD-10-CM | POA: Diagnosis not present

## 2018-04-26 DIAGNOSIS — E1122 Type 2 diabetes mellitus with diabetic chronic kidney disease: Secondary | ICD-10-CM | POA: Diagnosis not present

## 2018-04-26 DIAGNOSIS — K219 Gastro-esophageal reflux disease without esophagitis: Secondary | ICD-10-CM | POA: Diagnosis not present

## 2018-04-26 DIAGNOSIS — E785 Hyperlipidemia, unspecified: Secondary | ICD-10-CM | POA: Diagnosis not present

## 2018-04-26 DIAGNOSIS — Z7984 Long term (current) use of oral hypoglycemic drugs: Secondary | ICD-10-CM | POA: Diagnosis not present

## 2018-04-26 DIAGNOSIS — I4891 Unspecified atrial fibrillation: Secondary | ICD-10-CM | POA: Diagnosis not present

## 2018-04-26 DIAGNOSIS — Z7901 Long term (current) use of anticoagulants: Secondary | ICD-10-CM | POA: Diagnosis not present

## 2018-04-26 DIAGNOSIS — Z79899 Other long term (current) drug therapy: Secondary | ICD-10-CM | POA: Diagnosis not present

## 2018-04-26 DIAGNOSIS — D500 Iron deficiency anemia secondary to blood loss (chronic): Secondary | ICD-10-CM | POA: Diagnosis not present

## 2018-04-26 DIAGNOSIS — I129 Hypertensive chronic kidney disease with stage 1 through stage 4 chronic kidney disease, or unspecified chronic kidney disease: Secondary | ICD-10-CM | POA: Diagnosis not present

## 2018-04-26 DIAGNOSIS — N183 Chronic kidney disease, stage 3 (moderate): Secondary | ICD-10-CM | POA: Diagnosis not present

## 2018-04-27 LAB — CELIAC DISEASE PANEL
Endomysial Ab, IgA: NEGATIVE
IgA: 339 mg/dL (ref 61–437)
Tissue Transglutaminase Ab, IgA: <2 U/mL (ref 0–3)

## 2018-04-28 LAB — H. PYLORI ANTIGEN, STOOL: H. Pylori Stool Ag, Eia: NEGATIVE

## 2018-04-29 ENCOUNTER — Institutional Professional Consult (permissible substitution): Payer: Medicare Other | Admitting: Internal Medicine

## 2018-05-02 ENCOUNTER — Inpatient Hospital Stay: Payer: Medicare Other

## 2018-05-02 VITALS — BP 150/93 | HR 93 | Temp 98.1°F | Resp 18

## 2018-05-02 DIAGNOSIS — Z7984 Long term (current) use of oral hypoglycemic drugs: Secondary | ICD-10-CM | POA: Diagnosis not present

## 2018-05-02 DIAGNOSIS — Z7901 Long term (current) use of anticoagulants: Secondary | ICD-10-CM | POA: Diagnosis not present

## 2018-05-02 DIAGNOSIS — E1122 Type 2 diabetes mellitus with diabetic chronic kidney disease: Secondary | ICD-10-CM | POA: Diagnosis not present

## 2018-05-02 DIAGNOSIS — D500 Iron deficiency anemia secondary to blood loss (chronic): Secondary | ICD-10-CM | POA: Diagnosis not present

## 2018-05-02 DIAGNOSIS — Z79899 Other long term (current) drug therapy: Secondary | ICD-10-CM | POA: Diagnosis not present

## 2018-05-02 DIAGNOSIS — E785 Hyperlipidemia, unspecified: Secondary | ICD-10-CM | POA: Diagnosis not present

## 2018-05-02 DIAGNOSIS — N183 Chronic kidney disease, stage 3 (moderate): Secondary | ICD-10-CM | POA: Diagnosis not present

## 2018-05-02 DIAGNOSIS — Z87891 Personal history of nicotine dependence: Secondary | ICD-10-CM | POA: Diagnosis not present

## 2018-05-02 DIAGNOSIS — I129 Hypertensive chronic kidney disease with stage 1 through stage 4 chronic kidney disease, or unspecified chronic kidney disease: Secondary | ICD-10-CM | POA: Diagnosis not present

## 2018-05-02 DIAGNOSIS — K219 Gastro-esophageal reflux disease without esophagitis: Secondary | ICD-10-CM | POA: Diagnosis not present

## 2018-05-02 DIAGNOSIS — I4891 Unspecified atrial fibrillation: Secondary | ICD-10-CM | POA: Diagnosis not present

## 2018-05-02 MED ORDER — SODIUM CHLORIDE 0.9 % IV SOLN
Freq: Once | INTRAVENOUS | Status: AC
  Administered 2018-05-02: 14:00:00 via INTRAVENOUS
  Filled 2018-05-02: qty 1000

## 2018-05-02 MED ORDER — SODIUM CHLORIDE 0.9 % IV SOLN
510.0000 mg | Freq: Once | INTRAVENOUS | Status: AC
  Administered 2018-05-02: 510 mg via INTRAVENOUS
  Filled 2018-05-02: qty 17

## 2018-05-02 NOTE — Progress Notes (Signed)
Pt tolerated infusion well. Pt monitored 30 minutes post infusion. Pt stable at discharge.

## 2018-05-03 ENCOUNTER — Ambulatory Visit: Payer: Medicare Other | Admitting: Internal Medicine

## 2018-05-03 ENCOUNTER — Institutional Professional Consult (permissible substitution): Payer: Medicare Other | Admitting: Internal Medicine

## 2018-05-03 ENCOUNTER — Encounter: Payer: Self-pay | Admitting: Internal Medicine

## 2018-05-03 VITALS — BP 142/78 | HR 78 | Ht 70.0 in | Wt 278.0 lb

## 2018-05-03 DIAGNOSIS — J849 Interstitial pulmonary disease, unspecified: Secondary | ICD-10-CM

## 2018-05-03 DIAGNOSIS — J449 Chronic obstructive pulmonary disease, unspecified: Secondary | ICD-10-CM | POA: Diagnosis not present

## 2018-05-03 MED ORDER — ALBUTEROL SULFATE 108 (90 BASE) MCG/ACT IN AEPB: 2.0000 | INHALATION_SPRAY | RESPIRATORY_TRACT | 12 refills | Status: DC | PRN

## 2018-05-03 MED ORDER — BUDESONIDE-FORMOTEROL FUMARATE 160-4.5 MCG/ACT IN AERO: 2.0000 | INHALATION_SPRAY | Freq: Two times a day (BID) | RESPIRATORY_TRACT | 12 refills | Status: DC

## 2018-05-03 MED ORDER — PREDNISONE 20 MG PO TABS: 20.0000 mg | ORAL_TABLET | Freq: Every day | ORAL | 0 refills | Status: DC

## 2018-05-03 MED ORDER — ALBUTEROL SULFATE HFA 108 (90 BASE) MCG/ACT IN AERS: 2.0000 | INHALATION_SPRAY | RESPIRATORY_TRACT | 6 refills | Status: DC | PRN

## 2018-05-03 NOTE — Patient Instructions (Addendum)
START PREDNISONE 20 mg daily for 10 days START SYMBICORT INHALER TWICE DAILY ALBUTEROL AS NEEDED  Check OFFICE SPIRO at NEXT VISIT

## 2018-05-03 NOTE — Progress Notes (Signed)
Name: Ernest [name] MRN: [id number] DOB: 09-08-1951     CONSULTATION DATE: 5.17.19 REFERRING MD : Jeananne Rama  CHIEF COMPLAINT: SOB and hemoptysis  STUDIES:       3.2019   CT chest Independently reviewed by Me B/l interstitial lung disease Consider early ILD Findings compared to 2018 CT scans-with similar findings   HISTORY OF PRESENT ILLNESS: 67 year old pleasant white male seen today for abnormal CAT scan findings along with chronic shortness of breath chronic dyspnea on exertion with intermittent hemoptysis for the last several years His shortness of breath has been going on for many years Patient had previous history of influenza pneumonia 2 years ago Patient also has associated intermittent wheezing with intermittent cough Patient had's previously smoked 4 packs a day for 20 years however quit 29 years ago patient still works part-time as a Administrator  Overall his shortness of breath and dyspnea on exertion is somewhat debilitating at this time I have reviewed his CAT scan and reviewed findings with the patient as he has early interstitial lung disease and based on his symptoms of wheezing and shortness of breath with cough he may have underlying COPD as well   At this time there is no signs of acute infection at this time There is no signs of acute heart failure at this time   PAST MEDICAL HISTORY :   has a past medical history of Asthma, Diabetes mellitus, GERD (gastroesophageal reflux disease), Hyperlipidemia, and Hypertension.  has a past surgical history that includes Wrist surgery; Tumor excision; Cardiac catheterization (05/2010); Cardiac catheterization; Cardiac catheterization; Ankle surgery; Cardiac catheterization (07/2014); and Testicle surgery (70's). Prior to Admission medications   Medication Sig Start Date End Date Taking? Authorizing Provider  atorvastatin (LIPITOR) 40 MG tablet TAKE 1 TABLET BY MOUTH  DAILY AT 6 PM. 02/14/18  Yes Crissman, Jeannette How, MD    furosemide (LASIX) 40 MG tablet Take 0.5 tablets (20 mg total) by mouth 2 (two) times daily. 03/13/18  Yes Crissman, Jeannette How, MD  gabapentin (NEURONTIN) 600 MG tablet TAKE 2 TABLETS BY MOUTH AT  BEDTIME 02/14/18  Yes Crissman, Mark A, MD  glipiZIDE (GLUCOTROL) 5 MG tablet TAKE 1 TABLET BY MOUTH  DAILY BEFORE BREAKFAST 02/26/18  Yes Crissman, Jeannette How, MD  isosorbide mononitrate (IMDUR) 60 MG 24 hr tablet TAKE 1 TABLET BY MOUTH  DAILY 02/26/18  Yes Crissman, Jeannette How, MD  metFORMIN (GLUCOPHAGE) 500 MG tablet Take 2 tablets (1,000 mg total) by mouth 2 (two) times daily. 03/13/18  Yes Crissman, Jeannette How, MD  omeprazole (PRILOSEC) 20 MG capsule Take 20 mg by mouth 2 (two) times daily before a meal.    Yes [provider]  ramipril (ALTACE) 5 MG capsule Take 1 capsule (5 mg total) by mouth daily. 03/13/18  Yes Crissman, Jeannette How, MD  rivaroxaban (XARELTO) 20 MG TABS tablet Take 1 tablet (20 mg total) by mouth daily with supper. 03/13/18  Yes Crissman, Jeannette How, MD  albuterol (PROVENTIL HFA;VENTOLIN HFA) 108 (90 Base) MCG/ACT inhaler Inhale 2 puffs into the lungs every 4 (four) hours as needed for wheezing or shortness of breath. 05/03/18   Flora Lipps, MD   No Known Allergies  FAMILY HISTORY:  family history includes Alcohol abuse in his other; Cancer in his brother; Coronary artery disease in his other; Depression in his other; Diabetes in his mother and other; Heart disease in his mother; Hyperlipidemia in his mother and other; Hypertension in his mother and other; Kidney Stones in his  brother; Pneumonia in his father. SOCIAL HISTORY:  reports that he quit smoking about 24 years ago. His smoking use included cigarettes. He has a 112.00 pack-year smoking history. He has never used smokeless tobacco. He reports that he does not drink alcohol or use drugs.  REVIEW OF SYSTEMS:   Constitutional: Negative for fever, chills, weight loss, malaise/fatigue and diaphoresis.  HENT: Negative for hearing loss, ear pain,  nosebleeds, congestion, sore throat, neck pain, tinnitus and ear discharge.   Eyes: Negative for blurred vision, double vision, photophobia, pain, discharge and redness.  Respiratory: +cough, +hemoptysis,+ sputum production, +shortness of breath, +wheezing and stridor.   Cardiovascular: Negative for chest pain, palpitations, orthopnea, claudication, leg swelling and PND.  Gastrointestinal: Negative for heartburn, nausea, vomiting, abdominal pain, diarrhea, constipation, blood in stool and melena.  Genitourinary: Negative for dysuria, urgency, frequency, hematuria and flank pain.  Musculoskeletal: Negative for myalgias, back pain, joint pain and falls.  Skin: Negative for itching and rash.  Neurological: Negative for dizziness, tingling, tremors, sensory change, speech change, focal weakness, seizures, loss of consciousness, weakness and headaches.  Endo/Heme/Allergies: Negative for environmental allergies and polydipsia. Does not bruise/bleed easily.  ALL OTHER ROS ARE NEGATIVE   BP (!) 142/78 (BP Location: Left Arm, Cuff Size: Normal)   Pulse 78   Ht 5\' 10"  (1.778 m)   Wt 278 lb (126.1 kg)   SpO2 97%   BMI 39.89 kg/m    Physical Examination:   GENERAL:NAD, no fevers, chills, no weakness no fatigue HEAD: Normocephalic, atraumatic.  EYES: Pupils equal, round, reactive to light. Extraocular muscles intact. No scleral icterus.  MOUTH: Moist mucosal membrane.   EAR, NOSE, THROAT: Clear without exudates. No external lesions.  NECK: Supple. No thyromegaly. No nodules. No JVD.  PULMONARY:CTA B/L +wheezes, no crackles, no rhonchi CARDIOVASCULAR: S1 and S2. Regular rate and rhythm. No murmurs, rubs, or gallops. No edema.  GASTROINTESTINAL: Soft, nontender, nondistended. No masses. Positive bowel sounds.  MUSCULOSKELETAL: No swelling, clubbing, or edema. Range of motion full in all extremities.  NEUROLOGIC: Cranial nerves II through XII are intact. No gross focal neurological deficits.    SKIN: No ulceration, lesions, rashes, or cyanosis. Skin warm and dry. Turgor intact.  PSYCHIATRIC: Mood, affect within normal limits. The patient is awake, alert and oriented x 3. Insight, judgment intact.      ASSESSMENT / PLAN: 68 year old pleasant white male with a history of extensive smoking history with underlying interstitial lung disease consistent with early pulmonary fibrosis on the setting of CT findings with a history of chronic hemoptysis with intermittent wheezing and cough most likely related to underlying obstructive airways disease and COPD in the setting of deconditioned state and morbid obesity  Most of his pulmonary findings with probable COPD with chronic bronchitis and early interstitial lung disease is definitely related to his extensive smoking history  #1 shortness of breath and dyspnea on exertion multifactorial related to deconditioned state, obesity, COPD and ILD  #2 acute wheezing with mild COPD exacerbation Prednisone 20 mg daily for 10 days Recommending starting Symbicort inhaler therapy Also start albuterol inhaler therapy as needed  #3 Obesity -recommend significant weight loss -recommend changing diet  #4 Deconditioned state -Recommend increased daily activity and exercise    Patient satisfied with Plan of action and management. All questions answered Follow up in 3 months   Brandi Armato [name], M.D.  [name]  Medical Director [place] Director [facility] Cardio-Pulmonary Department

## 2018-05-06 ENCOUNTER — Telehealth: Payer: Self-pay | Admitting: Cardiovascular Disease

## 2018-05-06 NOTE — Telephone Encounter (Signed)
Lmov for patient to call back ° °

## 2018-05-06 NOTE — Telephone Encounter (Signed)
-----   Message from Vanessa Ralphs, RN sent at 05/03/2018  3:21 PM EDT ----- Patient needs follow up please.  THanks so much!  ----- Message ----- From: Minna Merritts, MD Sent: 05/03/2018  10:29 AM To: Martie Lee, LPN, [facility] Triage  preop notes from GI coming to wrong basket   triage, He was last seen 03/2017 Needs office visit for preop Previous instructions were for 6 mo f/u, in 09/2017 thx TG   ----- Message ----- From: Martie Lee, LPN Sent: 06/23/8087   3:55 PM To: Minna Merritts, MD

## 2018-05-07 NOTE — Telephone Encounter (Signed)
Pt has been scheduled for 06/14/18    Nothing else needed.

## 2018-05-09 ENCOUNTER — Inpatient Hospital Stay: Payer: Medicare Other

## 2018-05-09 VITALS — BP 148/84 | HR 99 | Temp 98.1°F | Resp 18

## 2018-05-09 DIAGNOSIS — E785 Hyperlipidemia, unspecified: Secondary | ICD-10-CM | POA: Diagnosis not present

## 2018-05-09 DIAGNOSIS — Z7901 Long term (current) use of anticoagulants: Secondary | ICD-10-CM | POA: Diagnosis not present

## 2018-05-09 DIAGNOSIS — Z7984 Long term (current) use of oral hypoglycemic drugs: Secondary | ICD-10-CM | POA: Diagnosis not present

## 2018-05-09 DIAGNOSIS — Z87891 Personal history of nicotine dependence: Secondary | ICD-10-CM | POA: Diagnosis not present

## 2018-05-09 DIAGNOSIS — I129 Hypertensive chronic kidney disease with stage 1 through stage 4 chronic kidney disease, or unspecified chronic kidney disease: Secondary | ICD-10-CM | POA: Diagnosis not present

## 2018-05-09 DIAGNOSIS — N183 Chronic kidney disease, stage 3 (moderate): Secondary | ICD-10-CM | POA: Diagnosis not present

## 2018-05-09 DIAGNOSIS — D500 Iron deficiency anemia secondary to blood loss (chronic): Secondary | ICD-10-CM

## 2018-05-09 DIAGNOSIS — E1122 Type 2 diabetes mellitus with diabetic chronic kidney disease: Secondary | ICD-10-CM | POA: Diagnosis not present

## 2018-05-09 DIAGNOSIS — K219 Gastro-esophageal reflux disease without esophagitis: Secondary | ICD-10-CM | POA: Diagnosis not present

## 2018-05-09 DIAGNOSIS — Z79899 Other long term (current) drug therapy: Secondary | ICD-10-CM | POA: Diagnosis not present

## 2018-05-09 DIAGNOSIS — I4891 Unspecified atrial fibrillation: Secondary | ICD-10-CM | POA: Diagnosis not present

## 2018-05-09 MED ORDER — SODIUM CHLORIDE 0.9 % IV SOLN
510.0000 mg | Freq: Once | INTRAVENOUS | Status: AC
  Administered 2018-05-09: 510 mg via INTRAVENOUS
  Filled 2018-05-09: qty 17

## 2018-05-09 MED ORDER — SODIUM CHLORIDE 0.9 % IV SOLN
Freq: Once | INTRAVENOUS | Status: AC
  Administered 2018-05-09: 14:00:00 via INTRAVENOUS
  Filled 2018-05-09: qty 1000

## 2018-05-09 NOTE — Progress Notes (Signed)
Pt tolerated infusion well. Pt and VS stable at discharge.  

## 2018-05-28 ENCOUNTER — Ambulatory Visit (INDEPENDENT_AMBULATORY_CARE_PROVIDER_SITE_OTHER): Payer: Medicare Other | Admitting: Family Medicine

## 2018-05-28 ENCOUNTER — Encounter: Payer: Self-pay | Admitting: Family Medicine

## 2018-05-28 VITALS — BP 163/97 | HR 112 | Ht 71.0 in | Wt 270.0 lb

## 2018-05-28 DIAGNOSIS — N183 Chronic kidney disease, stage 3 (moderate): Secondary | ICD-10-CM | POA: Diagnosis not present

## 2018-05-28 DIAGNOSIS — I10 Essential (primary) hypertension: Secondary | ICD-10-CM

## 2018-05-28 DIAGNOSIS — I482 Chronic atrial fibrillation: Secondary | ICD-10-CM

## 2018-05-28 DIAGNOSIS — E1122 Type 2 diabetes mellitus with diabetic chronic kidney disease: Secondary | ICD-10-CM

## 2018-05-28 DIAGNOSIS — I129 Hypertensive chronic kidney disease with stage 1 through stage 4 chronic kidney disease, or unspecified chronic kidney disease: Secondary | ICD-10-CM | POA: Diagnosis not present

## 2018-05-28 DIAGNOSIS — I4821 Permanent atrial fibrillation: Secondary | ICD-10-CM

## 2018-05-28 LAB — BAYER DCA HB A1C WAIVED: HB A1C (BAYER DCA - WAIVED): 8.6 % — ABNORMAL HIGH (ref <7.0)

## 2018-05-28 MED ORDER — METOPROLOL SUCCINATE ER 25 MG PO TB24: 25.0000 mg | ORAL_TABLET | Freq: Every day | ORAL | 3 refills | Status: DC

## 2018-05-28 MED ORDER — CANAGLIFLOZIN 300 MG PO TABS: 300.0000 mg | ORAL_TABLET | Freq: Every day | ORAL | 3 refills | Status: DC

## 2018-05-28 MED ORDER — RAMIPRIL 10 MG PO CAPS: 10.0000 mg | ORAL_CAPSULE | Freq: Every day | ORAL | 3 refills | Status: DC

## 2018-05-28 NOTE — Assessment & Plan Note (Signed)
Atrial fibrillation patient will continue blood thinner with rapid pulse today we will add metoprolol 25 mg which will help with blood pressure also.

## 2018-05-28 NOTE — Progress Notes (Signed)
BP (!) 163/97   Pulse (!) 112   Ht 5\' 11"  (1.803 m)   Wt 270 lb (122.5 kg)   SpO2 98%   BMI 37.66 kg/m    Subjective:    Patient ID: Ernest [name], male    DOB: Jun 06, 1951, 67 y.o.   MRN: [id number]  HPI: Ernest [name] is a 67 y.o. male  Neck multiple medical problems. Patient's diabetes patient's been trying to do better has lost some weight but was down at [employer] and enjoyed himself last month. Been taking medications blood pressure Xarelto diabetes medicines without problems or low blood sugar spells.   Relevant past medical, surgical, family and social history reviewed and updated as indicated. Interim medical history since our last visit reviewed. Allergies and medications reviewed and updated.  Review of Systems  Constitutional: Negative.   Respiratory: Negative.   Cardiovascular: Negative.     Per HPI unless specifically indicated above     Objective:    BP (!) 163/97   Pulse (!) 112   Ht 5\' 11"  (1.803 m)   Wt 270 lb (122.5 kg)   SpO2 98%   BMI 37.66 kg/m   Wt Readings from Last 3 Encounters:  05/28/18 270 lb (122.5 kg)  05/03/18 278 lb (126.1 kg)  04/25/18 274 lb 12.8 oz (124.6 kg)    Physical Exam  Constitutional: He is oriented to person, place, and time. He appears well-developed and well-nourished.  HENT:  Head: Normocephalic and atraumatic.  Eyes: Conjunctivae and EOM are normal.  Neck: Normal range of motion.  Cardiovascular: Normal rate, regular rhythm and normal heart sounds.  Pulmonary/Chest: Effort normal and breath sounds normal.  Musculoskeletal: Normal range of motion.  Neurological: He is alert and oriented to person, place, and time.  Skin: No erythema.  Psychiatric: He has a normal mood and affect. His behavior is normal. Judgment and thought content normal.    Results for orders placed or performed in visit on 04/26/18  H. pylori antigen, stool  Result Value Ref Range   H. Pylori Stool Ag, Eia Negative  Negative      Assessment & Plan:   Problem List Items Addressed This Visit      Cardiovascular and Mediastinum   Atrial fibrillation ([place])    Atrial fibrillation patient will continue blood thinner with rapid pulse today we will add metoprolol 25 mg which will help with blood pressure also.      Relevant Medications   ramipril (ALTACE) 10 MG capsule   metoprolol succinate (TOPROL-XL) 25 MG 24 hr tablet   Essential hypertension - Primary    Discussed hypertension poor control will increase Altace to 10 mg patient will take 2 a day of what he has home and give new prescription for 10 mg.      Relevant Medications   ramipril (ALTACE) 10 MG capsule   metoprolol succinate (TOPROL-XL) 25 MG 24 hr tablet   Other Relevant Orders   Bayer DCA Hb A1c Waived   Type 2 DM with CKD stage 3 and hypertension ([place])    Discussed diabetes poor control discussed diet exercise nutrition weight loss will start Invokana is may be covered on patient's insurance.      Relevant Medications   ramipril (ALTACE) 10 MG capsule   canagliflozin (INVOKANA) 300 MG TABS tablet   metoprolol succinate (TOPROL-XL) 25 MG 24 hr tablet   Other Relevant Orders   Bayer DCA Hb A1c Waived       Follow  up plan: Return in about 3 months (around 08/28/2018) for Hemoglobin A1c, BMP.

## 2018-05-28 NOTE — Assessment & Plan Note (Signed)
Discussed hypertension poor control will increase Altace to 10 mg patient will take 2 a day of what he has home and give new prescription for 10 mg.

## 2018-05-28 NOTE — Assessment & Plan Note (Signed)
Discussed diabetes poor control discussed diet exercise nutrition weight loss will start Ernest [name] is may be covered on patient's insurance.

## 2018-06-03 ENCOUNTER — Ambulatory Visit: Payer: Medicare Other | Admitting: Internal Medicine

## 2018-06-04 ENCOUNTER — Encounter: Payer: Self-pay | Admitting: Internal Medicine

## 2018-06-06 ENCOUNTER — Ambulatory Visit: Payer: Medicare Other | Admitting: Internal Medicine

## 2018-06-06 ENCOUNTER — Encounter: Payer: Self-pay | Admitting: Internal Medicine

## 2018-06-06 VITALS — BP 132/76 | HR 110 | Resp 16 | Ht 71.0 in | Wt 267.0 lb

## 2018-06-06 DIAGNOSIS — J449 Chronic obstructive pulmonary disease, unspecified: Secondary | ICD-10-CM | POA: Diagnosis not present

## 2018-06-06 MED ORDER — AZITHROMYCIN 250 MG PO TABS
ORAL_TABLET | ORAL | 0 refills | Status: DC
Start: 2018-06-06 — End: 2018-06-14

## 2018-06-06 MED ORDER — PREDNISONE 20 MG PO TABS: 40.0000 mg | ORAL_TABLET | Freq: Every day | ORAL | 0 refills | Status: DC

## 2018-06-06 NOTE — Progress Notes (Signed)
Name: Ernest [name] MRN: [id number] DOB: 1951-11-28     CONSULTATION DATE: 5.17.19 REFERRING MD : Jeananne Rama  CHIEF COMPLAINT: SOB and hemoptysis  STUDIES:       3.2019   CT chest Independently reviewed by Me B/l interstitial lung disease Consider early ILD Findings compared to 2018 CT scans-with similar findings   PREVIOUS HPI 67 year old pleasant white male seen today for abnormal CAT scan findings along with chronic shortness of breath chronic dyspnea on exertion with intermittent hemoptysis for the last several years His shortness of breath has been going on for many years Patient had previous history of influenza pneumonia 2 years ago Patient also has associated intermittent wheezing with intermittent cough Patient had's previously smoked 4 packs a day for 20 years however quit 29 years ago patient still works part-time as a Administrator  Overall his shortness of breath and dyspnea on exertion is somewhat debilitating at this time I have reviewed his CAT scan and reviewed findings with the patient as he has early interstitial lung disease and based on his symptoms of wheezing and shortness of breath with cough he may have underlying COPD as well  HPI + signs of acute infection at this time +produictive cough and wheezing There is no signs of acute heart failure at this time Feeling bad for 2 weeks He is stubborn and did not call the office Patient states that Symbicort and albuterol has helped alot  REVIEW OF SYSTEMS:   Constitutional: Negative for fever, chills, weight loss, malaise/fatigue and diaphoresis.  HENT: Negative for hearing loss, ear pain, nosebleeds, congestion, sore throat, neck pain, tinnitus and ear discharge.   Eyes: Negative for blurred vision, double vision, photophobia, pain, discharge and redness.  Respiratory: +cough, +hemoptysis,+ sputum production, +shortness of breath, +wheezing and stridor.   Cardiovascular: Negative for chest pain,  palpitations, orthopnea, claudication, leg swelling and PND.  ALL OTHER ROS ARE NEGATIVE   BP 132/76 (BP Location: Left Arm, Cuff Size: Large)   Pulse (!) 110   Resp 16   Ht 5\' 11"  (1.803 m)   Wt 267 lb (121.1 kg)   SpO2 91%   BMI 37.24 kg/m    Physical Examination:   GENERAL:NAD, no fevers, chills, no weakness no fatigue HEAD: Normocephalic, atraumatic.  EYES: Pupils equal, round, reactive to light. Extraocular muscles intact. No scleral icterus.  MOUTH: Moist mucosal membrane.   EAR, NOSE, THROAT: Clear without exudates. No external lesions.  NECK: Supple. No thyromegaly. No nodules. No JVD.  PULMONARY:CTA B/L +wheezes, no crackles, no rhonchi CARDIOVASCULAR: S1 and S2. Regular rate and rhythm. No murmurs, rubs, or gallops. No edema.  GASTROINTESTINAL: Soft, nontender, nondistended. No masses. Positive bowel sounds.  MUSCULOSKELETAL: No swelling, clubbing, or edema. Range of motion full in all extremities.  NEUROLOGIC: Cranial nerves II through XII are intact. No gross focal neurological deficits.  SKIN: No ulceration, lesions, rashes, or cyanosis. Skin warm and dry. Turgor intact.  PSYCHIATRIC: Mood, affect within normal limits. The patient is awake, alert and oriented x 3. Insight, judgment intact.      ASSESSMENT / PLAN: 67 year old pleasant white male with a history of extensive smoking history with underlying interstitial lung disease consistent with early pulmonary fibrosis on the setting of CT findings with a history of chronic hemoptysis with intermittent wheezing and cough  related to underlying obstructive airways disease and COPD in the setting of deconditioned state and morbid obesity  Most of his pulmonary findings with probable COPD with chronic bronchitis and  early interstitial lung disease is definitely related to his extensive smoking history  #1 shortness of breath and dyspnea on exertion multifactorial related to deconditioned state, obesity, COPD and  ILD  #2 acute wheezing with mild COPD exacerbation Prednisone 40 mg daily for 10 days Recommending continuing Symbicort inhaler therapy albuterol inhaler therapy as needed z pak  #3 Obesity -recommend significant weight loss -recommend changing diet  #4 Deconditioned state -Recommend increased daily activity and exercise    Patient satisfied with Plan of action and management. All questions answered Follow up in 3 months   Jacquelyn Shadrick [name], M.D.  [name]  Medical Director [place] Director [facility] Cardio-Pulmonary Department

## 2018-06-06 NOTE — Patient Instructions (Signed)
Start Prednisone 40 mg daily for 10 days Start Z pak

## 2018-06-14 ENCOUNTER — Encounter: Payer: Self-pay | Admitting: Nurse Practitioner

## 2018-06-14 ENCOUNTER — Ambulatory Visit: Payer: Medicare Other | Admitting: Nurse Practitioner

## 2018-06-14 VITALS — BP 150/80 | HR 103 | Ht 70.0 in | Wt 271.0 lb

## 2018-06-14 DIAGNOSIS — I482 Chronic atrial fibrillation: Secondary | ICD-10-CM

## 2018-06-14 DIAGNOSIS — I4821 Permanent atrial fibrillation: Secondary | ICD-10-CM

## 2018-06-14 DIAGNOSIS — I10 Essential (primary) hypertension: Secondary | ICD-10-CM

## 2018-06-14 MED ORDER — DILTIAZEM HCL ER COATED BEADS 180 MG PO CP24: 180.0000 mg | ORAL_CAPSULE | Freq: Every day | ORAL | 3 refills | Status: DC

## 2018-06-14 NOTE — Progress Notes (Signed)
Office Visit    Patient Name: Ernest [name] Date of Encounter: 06/14/2018  Primary Care Provider:  Guadalupe Maple, MD Primary Cardiologist:  Ida Rogue, MD  Chief Complaint    67 year old male with a history of permanent atrial fibrillation, remote tobacco abuse, COPD, early pulmonary fibrosis, GERD, hypertension, hyperlipidemia, obesity, and nonobstructive CAD, who presents for follow-up.  Past Medical History    Past Medical History:  Diagnosis Date  . Asthma   . COPD (chronic obstructive pulmonary disease) ([place])   . Diabetes mellitus    Type II  . Early Pulmonary fibrosis ([place])   . GERD (gastroesophageal reflux disease)   . History of echocardiogram    a. 02/2017 Echo: EF 60-65%, no rwma, mild MR, mod dil LA. Nl RV fxn. PASP 33mmHg.  Marland Kitchen Hyperlipidemia   . Hypertension   . Morbid obesity ([place])   . Non-obstructive CAD (coronary artery disease)    a. 2011 Cath: nonobs dzs; b. 07/2014 Cath: LM 30, LAD nl, LCX nl, RCA 20p, 66m.  Marland Kitchen Permanent atrial fibrillation ([facility])    a. CHA2DS2VASc = 4-->xarelto.   Past Surgical History:  Procedure Laterality Date  . ANKLE SURGERY    . CARDIAC CATHETERIZATION  05/2010   [facility]  . CARDIAC CATHETERIZATION     [facility]  . CARDIAC CATHETERIZATION     [facility]  . CARDIAC CATHETERIZATION  07/2014   [facility]  . TESTICLE SURGERY  70's  . TUMOR EXCISION     Neck and finger; benign  . WRIST SURGERY      Allergies  No Known Allergies  History of Present Illness    67 year old male with the above complex past medical history including permanent atrial fibrillation on chronic Xarelto, remote tobacco abuse, COPD, early pulmonary fibrosis, GERD, hypertension, hyperlipidemia, obesity, and nonobstructive CAD by catheterization in 2015.  He was last seen in cardiology clinic in April 2018.  Since then, he reports doing well from a cardiac standpoint.  He sometimes experiences palpitations and is only on those days that he will actually take a  beta-blocker.  When he was last here, he was advised to try taking metoprolol succinate in the evening and then he was placed on diltiazem.  He says he has no recollection of ever taking diltiazem.  He only takes metoprolol as needed for palpitations because it causes him to feel very fatigued no matter when he takes it.  That said, he is accustomed to having frequent elevations in heart rates, in the low 100s.  Heart rate is 103 today.  He was recently evaluated by pulmonology secondary to ongoing dyspnea on exertion in the setting of COPD.  Recent CT showed early pulmonary fibrosis.  He is currently on a prednisone taper secondary to bronchitis that he developed earlier this month.  He is improving some.  He went to [place] recently and said he was walking quite a bit without any significant dyspnea on exertion.  He only has a cough that is bothersome.  He denies any chest pain, PND, orthopnea, dizziness, syncope, edema, or early satiety.  Home Medications    Prior to Admission medications   Medication Sig Start Date End Date Taking? Authorizing Provider  albuterol (PROVENTIL HFA;VENTOLIN HFA) 108 (90 Base) MCG/ACT inhaler Inhale 2 puffs into the lungs every 4 (four) hours as needed for wheezing or shortness of breath. 05/03/18  Yes Flora Lipps, MD  Albuterol Sulfate 108 (90 Base) MCG/ACT AEPB Inhale 2 Act into the lungs every 4 (  four) hours as needed. 05/03/18  Yes Kasa, Maretta Bees, MD  atorvastatin (LIPITOR) 40 MG tablet TAKE 1 TABLET BY MOUTH  DAILY AT 6 PM. 02/14/18  Yes Crissman, Jeannette How, MD  budesonide-formoterol (SYMBICORT) 160-4.5 MCG/ACT inhaler Inhale 2 puffs into the lungs 2 (two) times daily. 05/03/18  Yes Flora Lipps, MD  canagliflozin (INVOKANA) 300 MG TABS tablet Take 1 tablet (300 mg total) by mouth daily before breakfast. 05/28/18  Yes Crissman, Jeannette How, MD  furosemide (LASIX) 40 MG tablet Take 0.5 tablets (20 mg total) by mouth 2 (two) times daily. 03/13/18  Yes Crissman, Jeannette How, MD    gabapentin (NEURONTIN) 600 MG tablet TAKE 2 TABLETS BY MOUTH AT  BEDTIME 02/14/18  Yes Crissman, Mark A, MD  glipiZIDE (GLUCOTROL) 5 MG tablet TAKE 1 TABLET BY MOUTH  DAILY BEFORE BREAKFAST 02/26/18  Yes Crissman, Jeannette How, MD  isosorbide mononitrate (IMDUR) 60 MG 24 hr tablet TAKE 1 TABLET BY MOUTH  DAILY 02/26/18  Yes Crissman, Jeannette How, MD  metFORMIN (GLUCOPHAGE) 500 MG tablet Take 2 tablets (1,000 mg total) by mouth 2 (two) times daily. 03/13/18  Yes Crissman, Jeannette How, MD  metoprolol succinate (TOPROL-XL) 25 MG 24 hr tablet Take 1 tablet (25 mg total) by mouth daily. 05/28/18  Yes Crissman, Jeannette How, MD  omeprazole (PRILOSEC) 20 MG capsule Take 20 mg by mouth 2 (two) times daily before a meal.    Yes [provider]  predniSONE (DELTASONE) 20 MG tablet Take 2 tablets (40 mg total) by mouth daily with breakfast. 10 days 06/06/18  Yes Flora Lipps, MD  ramipril (ALTACE) 10 MG capsule Take 1 capsule (10 mg total) by mouth daily. 05/28/18  Yes Crissman, Jeannette How, MD  rivaroxaban (XARELTO) 20 MG TABS tablet Take 1 tablet (20 mg total) by mouth daily with supper. 03/13/18  Yes Crissman, Jeannette How, MD  diltiazem (CARDIZEM CD) 180 MG 24 hr capsule Take 1 capsule (180 mg total) by mouth daily. 06/14/18 09/12/18  Theora Gianotti, NP    Review of Systems    Chronic dyspnea on exertion.  Recently dealing with a cough.  Occasional palpitations.  All other systems reviewed and are otherwise negative except as noted above.  Physical Exam    VS:  BP (!) 150/80 (BP Location: Left Arm, Patient Position: Sitting, Cuff Size: Large)   Pulse (!) 103   Ht 5\' 10"  (1.778 m)   Wt 271 lb (122.9 kg)   BMI 38.88 kg/m  , BMI Body mass index is 38.88 kg/m. GEN: Obese, in no acute distress.  HEENT: normal.  Neck: Supple, no JVD, carotid bruits, or masses. Cardiac: Irregularly irregular, no murmurs, rubs, or gallops. No clubbing, cyanosis, edema.  Radials/DP/PT 2+ and equal bilaterally.  Respiratory:  Respirations  regular and unlabored, clear to auscultation bilaterally. GI: Soft, nontender, nondistended, BS + x 4. MS: no deformity or atrophy. Skin: warm and dry, no rash. Neuro:  Strength and sensation are intact. Psych: Normal affect.  Accessory Clinical Findings    ECG -atrial fibrillation, 103, PVC, nonspecific T changes.  Assessment & Plan    1.  Permanent atrial fibrillation: Not ideally rate controlled in the setting of noncompliance with beta-blocker.  He says metoprolol causes him to feel very fatigued and thus he only takes it on as-needed basis if heart rates are markedly elevated.  He is supposed to take diltiazem however, he is not sure that he ever was on this.  Notes indicate that he was taking 120  mg of SR twice daily up until mid May, when the prescription ran out.  Again, he does not think he was ever taking it.  I am going to give him a prescription for diltiazem CD 180 mg daily.  We will see how he tolerates this and consider additional titration.  He remains anticoagulated with Xarelto.  He is pending colonoscopy and may come off of Xarelto 3 days prior to colonoscopy without bridging.  2.  Essential hypertension: Blood pressure elevated today.  Adding diltiazem as outlined above.  Continue ACE inhibitor.  3.  Hyperlipidemia: Continue statin therapy.  4.  CAD with coronary vasospasm: No significant chest pain.  Continue beta-blocker, nitrate, and statin therapy.  No aspirin in the setting of Xarelto.  5.  COPD/early pulmonary fibrosis: Followed by pulmonology.  6.  Disposition: Follow-up in 1 year or sooner if necessary.   Murray Hodgkins, NP 06/14/2018, 12:42 PM

## 2018-06-14 NOTE — Patient Instructions (Signed)
Medication Instructions:  Your physician has recommended you make the following change in your medication:  1. START Diltiazem 180 mg once daily   Follow-Up: Your physician wants you to follow-up in: 1 year with Dr. Rockey Situ. You will receive a reminder letter in the mail two months in advance. If you don't receive a letter, please call our office to schedule the follow-up appointment.   It was a pleasure seeing you today here in the office. Please do not hesitate to give [place] a call back if you have any further questions. [place], BSN

## 2018-06-25 ENCOUNTER — Inpatient Hospital Stay: Payer: Medicare Other | Admitting: Oncology

## 2018-06-25 ENCOUNTER — Inpatient Hospital Stay: Payer: Medicare Other

## 2018-06-26 ENCOUNTER — Encounter
Admission: RE | Disposition: A | Discharge status: Home / Self Care | Payer: Self-pay | Source: Ambulatory Visit | Attending: Gastroenterology

## 2018-06-26 ENCOUNTER — Other Ambulatory Visit: Payer: Self-pay

## 2018-06-26 ENCOUNTER — Ambulatory Visit: Payer: Medicare Other | Admitting: Registered Nurse

## 2018-06-26 ENCOUNTER — Encounter: Payer: Self-pay | Admitting: *Deleted

## 2018-06-26 ENCOUNTER — Ambulatory Visit
Admission: RE | Admit: 2018-06-26 | Discharge: 2018-06-26 | Disposition: A | Discharge status: Home / Self Care | Payer: Medicare Other | Source: Ambulatory Visit | Attending: Gastroenterology | Admitting: Gastroenterology

## 2018-06-26 DIAGNOSIS — Z79899 Other long term (current) drug therapy: Secondary | ICD-10-CM | POA: Insufficient documentation

## 2018-06-26 DIAGNOSIS — I482 Chronic atrial fibrillation: Secondary | ICD-10-CM | POA: Diagnosis not present

## 2018-06-26 DIAGNOSIS — Z87891 Personal history of nicotine dependence: Secondary | ICD-10-CM | POA: Diagnosis not present

## 2018-06-26 DIAGNOSIS — K6389 Other specified diseases of intestine: Secondary | ICD-10-CM

## 2018-06-26 DIAGNOSIS — D125 Benign neoplasm of sigmoid colon: Secondary | ICD-10-CM | POA: Diagnosis not present

## 2018-06-26 DIAGNOSIS — K228 Other specified diseases of esophagus: Secondary | ICD-10-CM | POA: Diagnosis not present

## 2018-06-26 DIAGNOSIS — K317 Polyp of stomach and duodenum: Secondary | ICD-10-CM | POA: Diagnosis not present

## 2018-06-26 DIAGNOSIS — E119 Type 2 diabetes mellitus without complications: Secondary | ICD-10-CM | POA: Insufficient documentation

## 2018-06-26 DIAGNOSIS — I2510 Atherosclerotic heart disease of native coronary artery without angina pectoris: Secondary | ICD-10-CM | POA: Diagnosis not present

## 2018-06-26 DIAGNOSIS — D500 Iron deficiency anemia secondary to blood loss (chronic): Secondary | ICD-10-CM | POA: Diagnosis not present

## 2018-06-26 DIAGNOSIS — D509 Iron deficiency anemia, unspecified: Secondary | ICD-10-CM

## 2018-06-26 DIAGNOSIS — N183 Chronic kidney disease, stage 3 (moderate): Secondary | ICD-10-CM | POA: Diagnosis not present

## 2018-06-26 DIAGNOSIS — K3189 Other diseases of stomach and duodenum: Secondary | ICD-10-CM | POA: Insufficient documentation

## 2018-06-26 DIAGNOSIS — K210 Gastro-esophageal reflux disease with esophagitis: Secondary | ICD-10-CM | POA: Diagnosis not present

## 2018-06-26 DIAGNOSIS — E785 Hyperlipidemia, unspecified: Secondary | ICD-10-CM | POA: Diagnosis not present

## 2018-06-26 DIAGNOSIS — Z7984 Long term (current) use of oral hypoglycemic drugs: Secondary | ICD-10-CM | POA: Insufficient documentation

## 2018-06-26 DIAGNOSIS — Z7901 Long term (current) use of anticoagulants: Secondary | ICD-10-CM | POA: Insufficient documentation

## 2018-06-26 DIAGNOSIS — G4730 Sleep apnea, unspecified: Secondary | ICD-10-CM | POA: Diagnosis not present

## 2018-06-26 DIAGNOSIS — K635 Polyp of colon: Secondary | ICD-10-CM

## 2018-06-26 DIAGNOSIS — Z7951 Long term (current) use of inhaled steroids: Secondary | ICD-10-CM | POA: Diagnosis not present

## 2018-06-26 DIAGNOSIS — D123 Benign neoplasm of transverse colon: Secondary | ICD-10-CM | POA: Diagnosis not present

## 2018-06-26 DIAGNOSIS — I129 Hypertensive chronic kidney disease with stage 1 through stage 4 chronic kidney disease, or unspecified chronic kidney disease: Secondary | ICD-10-CM | POA: Diagnosis not present

## 2018-06-26 DIAGNOSIS — I10 Essential (primary) hypertension: Secondary | ICD-10-CM | POA: Diagnosis not present

## 2018-06-26 DIAGNOSIS — K2289 Other specified disease of esophagus: Secondary | ICD-10-CM

## 2018-06-26 DIAGNOSIS — E1122 Type 2 diabetes mellitus with diabetic chronic kidney disease: Secondary | ICD-10-CM | POA: Diagnosis not present

## 2018-06-26 DIAGNOSIS — J449 Chronic obstructive pulmonary disease, unspecified: Secondary | ICD-10-CM | POA: Diagnosis not present

## 2018-06-26 HISTORY — PX: COLONOSCOPY WITH PROPOFOL: SHX5780

## 2018-06-26 HISTORY — PX: ESOPHAGOGASTRODUODENOSCOPY (EGD) WITH PROPOFOL: SHX5813

## 2018-06-26 LAB — GLUCOSE, CAPILLARY: Glucose-Capillary: 217 mg/dL — ABNORMAL HIGH (ref 70–99)

## 2018-06-26 SURGERY — COLONOSCOPY WITH PROPOFOL
Anesthesia: General

## 2018-06-26 MED ORDER — PROPOFOL 10 MG/ML IV BOLUS
INTRAVENOUS | Status: DC | PRN
  Administered 2018-06-26: 20 mg via INTRAVENOUS
  Administered 2018-06-26: 80 mg via INTRAVENOUS
  Administered 2018-06-26: 30 mg via INTRAVENOUS

## 2018-06-26 MED ORDER — MIDAZOLAM HCL 2 MG/2ML IJ SOLN
INTRAMUSCULAR | Status: AC
  Filled 2018-06-26: qty 2

## 2018-06-26 MED ORDER — SODIUM CHLORIDE 0.9 % IV SOLN
INTRAVENOUS | Status: DC
  Administered 2018-06-26: 11:00:00 via INTRAVENOUS

## 2018-06-26 MED ORDER — PROPOFOL 500 MG/50ML IV EMUL
INTRAVENOUS | Status: AC
  Filled 2018-06-26: qty 50

## 2018-06-26 MED ORDER — PROPOFOL 500 MG/50ML IV EMUL
INTRAVENOUS | Status: DC | PRN
  Administered 2018-06-26: 150 ug/kg/min via INTRAVENOUS

## 2018-06-26 MED ORDER — GLYCOPYRROLATE 0.2 MG/ML IJ SOLN
INTRAMUSCULAR | Status: DC | PRN
  Administered 2018-06-26: 0.2 mg via INTRAVENOUS

## 2018-06-26 MED ORDER — LIDOCAINE HCL (CARDIAC) PF 100 MG/5ML IV SOSY
PREFILLED_SYRINGE | INTRAVENOUS | Status: DC | PRN
  Administered 2018-06-26 (×2): 40 mg via INTRAVENOUS

## 2018-06-26 NOTE — Op Note (Signed)
[facility] Patient Name: Ernest [name] Procedure Date: 06/26/2018 11:32 AM MRN: [id number] Account #: [id number] Date of Birth: 11/26/1951 Admit Type: Outpatient Age: 67 Room: [facility] ENDO ROOM 2 Gender: Male Note Status: Finalized Procedure:            Upper GI endoscopy Indications:          Iron deficiency anemia Providers:            Constantin Hillery B. Bonna Gains MD, MD Referring MD:         Guadalupe Maple, MD (Referring MD) Medicines:            Monitored Anesthesia Care Complications:        No immediate complications. Procedure:            Pre-Anesthesia Assessment:                       - Prior to the procedure, a History and Physical was                        performed, and patient medications, allergies and                        sensitivities were reviewed. The patient's tolerance of                        previous anesthesia was reviewed.                       - The risks and benefits of the procedure and the                        sedation options and risks were discussed with the                        patient. All questions were answered and informed                        consent was obtained.                       - Patient identification and proposed procedure were                        verified prior to the procedure by the physician, the                        nurse, the anesthesiologist, the anesthetist and the                        technician. The procedure was verified in the procedure                        room.                       - ASA Grade Assessment: III - A patient with severe                        systemic disease.  After obtaining informed consent, the endoscope was                        passed under direct vision. Throughout the procedure,                        the patient's blood pressure, pulse, and oxygen                        saturations were monitored continuously. The Endoscope                   was introduced through the mouth, and advanced to the                        second part of duodenum. The upper GI endoscopy was                        accomplished with ease. The patient tolerated the                        procedure well. Findings:      One tongue of salmon-colored mucosa was present from 38 to 39 cm. No       other visible abnormalities were present. The maximum longitudinal       extent of these esophageal mucosal changes was 1 cm in length. Mucosa       was biopsied with a cold forceps for histology in 4 quadrants at the       gastroesophageal junction.      Patchy mildly erythematous mucosa without bleeding was found in the       gastric antrum. Biopsies were taken with a cold forceps for histology.       Biopsies were obtained in the gastric body, at the incisura and in the       gastric antrum with cold forceps for histology.      A few 3 to 4 mm sessile polyps with no bleeding and no stigmata of       recent bleeding were found in the gastric body. Biopsies were taken with       a cold forceps for histology.      The duodenal bulb, second portion of the duodenum and examined duodenum       were normal. Biopsies for histology were taken with a cold forceps for       evaluation of celiac disease. Impression:           - Salmon-colored mucosa suspicious for short-segment                        Barrett's esophagus. Biopsied.                       - Erythematous mucosa in the antrum. Biopsied.                       - A few gastric polyps. Biopsied.                       - Normal duodenal bulb, second portion of the duodenum                        and  examined duodenum. Biopsied.                       - Biopsies were obtained in the gastric body, at the                        incisura and in the gastric antrum. Recommendation:       - Await pathology results.                       - Discharge patient to home (with escort).                       -  Advance diet as tolerated.                       - Continue present medications.                       - Patient has a contact number available for                        emergencies. The signs and symptoms of potential                        delayed complications were discussed with the patient.                        Return to normal activities tomorrow. Written discharge                        instructions were provided to the patient.                       - Discharge patient to home (with escort).                       - The findings and recommendations were discussed with                        the patient.                       - The findings and recommendations were discussed with                        the patient's family. Procedure Code(s):    --- Professional ---                       [phone], Esophagogastroduodenoscopy, flexible, transoral;                        with biopsy, single or multiple Diagnosis Code(s):    --- Professional ---                       K22.8, Other specified diseases of esophagus                       K31.89, Other diseases of stomach and duodenum                       K31.7, Polyp of stomach  and duodenum                       D50.9, Iron deficiency anemia, unspecified CPT copyright 2017 American Medical Association. All rights reserved. The codes documented in this report are preliminary and upon coder review may  be revised to meet current compliance requirements.  [name], MD Margretta Sidle B. Bonna Gains MD, MD 06/26/2018 11:54:48 AM This report has been signed electronically. Number of Addenda: 0 Note Initiated On: 06/26/2018 11:32 AM Estimated Blood Loss: Estimated blood loss: none.      [facility]

## 2018-06-26 NOTE — Op Note (Signed)
[facility]astroenterology Patient Name: Akoni Parton Procedure Date: 06/26/2018 11:31 AM MRN: [id number] Account #: [id number] Date of Birth: 02-26-1951 Admit Type: Outpatient Age: 67 Room: [facility] ENDO ROOM 2 Gender: Male Note Status: Finalized Procedure:            Colonoscopy Indications:          Iron deficiency anemia Providers:            Varnita B. Bonna Gains MD, MD Referring MD:         Guadalupe Maple, MD (Referring MD) Medicines:            Monitored Anesthesia Care Complications:        No immediate complications. Procedure:            Pre-Anesthesia Assessment:                       - ASA Grade Assessment: IV - A patient with severe                        systemic disease that is a constant threat to life.                       - Prior to the procedure, a History and Physical was                        performed, and patient medications, allergies and                        sensitivities were reviewed. The patient's tolerance of                        previous anesthesia was reviewed.                       - The risks and benefits of the procedure and the                        sedation options and risks were discussed with the                        patient. All questions were answered and informed                        consent was obtained.                       - Patient identification and proposed procedure were                        verified prior to the procedure by the physician, the                        nurse, the anesthesiologist, the anesthetist and the                        technician. The procedure was verified in the procedure                        room.  After obtaining informed consent, the colonoscope was                        passed under direct vision. Throughout the procedure,                        the patient's blood pressure, pulse, and oxygen                        saturations were monitored  continuously. The                        Colonoscope was introduced through the anus and                        advanced to the the terminal ileum. The colonoscopy was                        performed with ease. The patient tolerated the                        procedure well. The quality of the bowel preparation                        was fair. Findings:      The perianal and digital rectal examinations were normal.      Three flat polyps were found in the transverse colon. The polyps were 4       to 5 mm in size. These polyps were removed with a cold snare. Resection       and retrieval were complete.      A 5 mm polyp was found in the sigmoid colon. The polyp was flat. The       polyp was removed with a cold snare. Resection and retrieval were       complete.      A localized area of mildly thickened folds of the mucosa was found in       the sigmoid colon. Biopsies were taken with a cold forceps for       histology. This was seen at 28 cm from the anal verge      The exam was otherwise without abnormality.      The rectum, sigmoid colon, descending colon, transverse colon, ascending       colon and cecum appeared normal.      The retroflexed view of the distal rectum and anal verge was normal and       showed no anal or rectal abnormalities. Impression:           - Preparation of the colon was fair.                       - Three 4 to 5 mm polyps in the transverse colon,                        removed with a cold snare. Resected and retrieved.                       - One 5 mm polyp in the sigmoid colon, removed with a  cold snare. Resected and retrieved.                       - Thickened folds of the mucosa in the sigmoid colon.                        Biopsied.                       - The examination was otherwise normal.                       - The rectum, sigmoid colon, descending colon,                        transverse colon, ascending colon and cecum are  normal.                       - The distal rectum and anal verge are normal on                        retroflexion view. Recommendation:       - Discharge patient to home (with escort).                       - Advance diet as tolerated.                       - Continue present medications.                       - Await pathology results.                       - Repeat colonoscopy in 3 years for surveillance with 2                        day prep.                       - The findings and recommendations were discussed with                        the patient.                       - The findings and recommendations were discussed with                        the patient's family.                       - Return to primary care physician as previously                        scheduled.                       - Return to GI clinic as previously scheduled.                       - Discuss small bowel capsule in clinic Procedure Code(s):    --- Professional ---  45385, Colonoscopy, flexible; with removal of tumor(s),                        polyp(s), or other lesion(s) by snare technique                       45380, 59, Colonoscopy, flexible; with biopsy, single                        or multiple Diagnosis Code(s):    --- Professional ---                       D12.3, Benign neoplasm of transverse colon (hepatic                        flexure or splenic flexure)                       D12.5, Benign neoplasm of sigmoid colon                       K63.89, Other specified diseases of intestine                       D50.9, Iron deficiency anemia, unspecified CPT copyright 2017 American Medical Association. All rights reserved. The codes documented in this report are preliminary and upon coder review may  be revised to meet current compliance requirements.  [name], MD Margretta Sidle B. Bonna Gains MD, MD 06/26/2018 12:36:12 PM This report has been signed electronically. Number  of Addenda: 0 Note Initiated On: 06/26/2018 11:31 AM Scope Withdrawal Time: 0 hours 30 minutes 14 seconds  Total Procedure Duration: 0 hours 33 minutes 59 seconds  Estimated Blood Loss: Estimated blood loss: none.      [facility]

## 2018-06-26 NOTE — Anesthesia Post-op Follow-up Note (Signed)
Anesthesia QCDR form completed.        

## 2018-06-26 NOTE — Anesthesia Postprocedure Evaluation (Signed)
Anesthesia Post Note  Patient: Ernest [name]  Procedure(s) Performed: COLONOSCOPY WITH PROPOFOL (N/A ) ESOPHAGOGASTRODUODENOSCOPY (EGD) WITH PROPOFOL (N/A )  Patient location during evaluation: PACU Anesthesia Type: General Level of consciousness: awake and alert Pain management: pain level controlled Vital Signs Assessment: post-procedure vital signs reviewed and stable Respiratory status: spontaneous breathing, nonlabored ventilation, respiratory function stable and patient connected to nasal cannula oxygen Cardiovascular status: blood pressure returned to baseline and stable Postop Assessment: no apparent nausea or vomiting Anesthetic complications: no     Last Vitals:  Vitals:   06/26/18 1056  BP: (!) 140/96  Pulse: (!) 110  Resp: 16  Temp: (!) 36.1 C  SpO2: 98%    Last Pain:  Vitals:   06/26/18 1056  TempSrc: Tympanic  PainSc: 0-No pain                 Durenda Hurt

## 2018-06-26 NOTE — Anesthesia Procedure Notes (Signed)
Date/Time: 06/26/2018 11:34 AM Performed by: Doreen Salvage, CRNA Pre-anesthesia Checklist: Patient identified, Emergency Drugs available, Suction available and Patient being monitored Patient Re-evaluated:Patient Re-evaluated prior to induction Oxygen Delivery Method: Nasal cannula Induction Type: IV induction Dental Injury: Teeth and Oropharynx as per pre-operative assessment  Comments: Nasal cannula with etCO2 monitoring

## 2018-06-26 NOTE — H&P (Signed)
[name], MD [address] [place], [place], [name], [address] [address], [place], [place], [name], [address] Phone: [phone]  Fax: [phone]  Primary Care Physician:  Guadalupe Maple, MD   Pre-Procedure History & Physical: HPI:  Ernest [name] is a 67 y.o. male is here for a colonoscopy and EGD.   Past Medical History:  Diagnosis Date  . Asthma   . COPD (chronic obstructive pulmonary disease) ([place])   . Diabetes mellitus    Type II  . Early Pulmonary fibrosis ([place])   . GERD (gastroesophageal reflux disease)   . History of echocardiogram    a. 02/2017 Echo: EF 60-65%, no rwma, mild MR, mod dil LA. Nl RV fxn. PASP 21mmHg.  Marland Kitchen Hyperlipidemia   . Hypertension   . Morbid obesity ([place])   . Non-obstructive CAD (coronary artery disease)    a. 2011 Cath: nonobs dzs; b. 07/2014 Cath: LM 30, LAD nl, LCX nl, RCA 20p, 69m.  Marland Kitchen Permanent atrial fibrillation (HCC)    a. CHA2DS2VASc = 4-->xarelto.    Past Surgical History:  Procedure Laterality Date  . ANKLE SURGERY    . CARDIAC CATHETERIZATION  05/2010   [facility]  . CARD[facility]AC CAT[facility]TERIZATION     [facility]  . CARD[facility]AC CATHETERIZATION     [facility]  . CARDIAC CATHETERIZATION  07/2014   [facility]  . COLONOSCOPY    . TESTICLE SURGERY  70's  . TUMOR EXCISION     Neck and finger; benign  . WRIST SURGERY      Prior to Admission medications   Medication Sig Start Date End Date Taking? Authorizing Provider  albuterol (PROVENTIL HFA;VENTOLIN HFA) 108 (90 Base) MCG/ACT inhaler Inhale 2 puffs into the lungs every 4 (four) hours as needed for wheezing or shortness of breath. 05/03/18  Yes Flora Lipps, MD  Albuterol Sulfate 108 (90 Base) MCG/ACT AEPB Inhale 2 Act into the lungs every 4 (four) hours as needed. 05/03/18  Yes Kasa, Maretta Bees, MD  atorvastatin (LIPITOR) 40 MG tablet TAKE 1 TABLET BY MOUTH  DAILY AT 6 PM. 02/14/18  Yes Crissman, Jeannette How, MD  budesonide-formoterol (SYMBICORT) 160-4.5 MCG/ACT inhaler Inhale 2 puffs into the lungs 2  (two) times daily. 05/03/18  Yes Flora Lipps, MD  canagliflozin (INVOKANA) 300 MG TABS tablet Take 1 tablet (300 mg total) by mouth daily before breakfast. 05/28/18  Yes Crissman, Jeannette How, MD  diltiazem (CARDIZEM CD) 180 MG 24 hr capsule Take 1 capsule (180 mg total) by mouth daily. 06/14/18 09/12/18 Yes Theora Gianotti, NP  furosemide (LASIX) 40 MG tablet Take 0.5 tablets (20 mg total) by mouth 2 (two) times daily. 03/13/18  Yes Crissman, Jeannette How, MD  gabapentin (NEURONTIN) 600 MG tablet TAKE 2 TABLETS BY MOUTH AT  BEDTIME 02/14/18  Yes Crissman, Mark A, MD  glipiZIDE (GLUCOTROL) 5 MG tablet TAKE 1 TABLET BY MOUTH  DAILY BEFORE BREAKFAST 02/26/18  Yes Crissman, Jeannette How, MD  isosorbide mononitrate (IMDUR) 60 MG 24 hr tablet TAKE 1 TABLET BY MOUTH  DAILY 02/26/18  Yes Crissman, Jeannette How, MD  metFORMIN (GLUCOPHAGE) 500 MG tablet Take 2 tablets (1,000 mg total) by mouth 2 (two) times daily. 03/13/18  Yes Crissman, Jeannette How, MD  metoprolol succinate (TOPROL-XL) 25 MG 24 hr tablet Take 1 tablet (25 mg total) by mouth daily. 05/28/18  Yes Crissman, Jeannette How, MD  omeprazole (PRILOSEC) 20 MG capsule Take 20 mg by mouth 2 (two) times daily before a meal.    Yes [provider]  ramipril (ALTACE) 10 MG capsule Take 1 capsule (10 mg total) by mouth daily. 05/28/18  Yes Crissman, Jeannette How, MD  rivaroxaban (XARELTO) 20 MG TABS tablet Take 1 tablet (20 mg total) by mouth daily with supper. 03/13/18  Yes Crissman, Jeannette How, MD  predniSONE (DELTASONE) 20 MG tablet Take 2 tablets (40 mg total) by mouth daily with breakfast. 10 days Patient not taking: Reported on 06/26/2018 06/06/18   Flora Lipps, MD    Allergies as of 04/23/2018  . (No Known Allergies)    Family History  Problem Relation Age of Onset  . Hypertension Mother   . Hyperlipidemia Mother   . Diabetes Mother   . Heart disease Mother        CABG  . Pneumonia Father        rare type  . Diabetes Other   . Depression Other   . Coronary artery disease  Other   . Alcohol abuse Other   . Hypertension Other   . Hyperlipidemia Other   . Cancer Brother   . Kidney Stones Brother     Social History   Socioeconomic History  . Marital status: Widowed    Spouse name: Not on file  . Number of children: Not on file  . Years of education: Not on file  . Highest education level: Not on file  Occupational History  . Occupation: Full time  Social Needs  . Financial resource strain: Not hard at all  . Food insecurity:    Worry: Never true    Inability: Never true  . Transportation needs:    Medical: No    Non-medical: No  Tobacco Use  . Smoking status: Former Smoker    Packs/day: 4.00    Years: 28.00    Pack years: 112.00    Types: Cigarettes    Last attempt to quit: 08/25/1993    Years since quitting: 24.8  . Smokeless tobacco: Never Used  Substance and Sexual Activity  . Alcohol use: Yes    Comment: rarely  . Drug use: No  . Sexual activity: Not on file  Lifestyle  . Physical activity:    Days per week: 0 days    Minutes per session: 0 min  . Stress: Not at all  Relationships  . Social connections:    Talks on phone: More than three times a week    Gets together: More than three times a week    Attends religious service: Never    Active member of club or organization: No    Attends meetings of clubs or organizations: Never    Relationship status: Widowed  . Intimate partner violence:    Fear of current or ex partner: No    Emotionally abused: No    Physically abused: No    Forced sexual activity: No  Other Topics Concern  . Not on file  Social History Narrative   No regular exercise.    Review of Systems: See HPI, otherwise negative ROS  Physical Exam: BP (!) 140/96   Pulse (!) 110   Temp (!) 96.9 F (36.1 C) (Tympanic) Comment: 96.9  Resp 16   Ht 5\' 10"  (1.778 m)   Wt 271 lb (122.9 kg)   SpO2 98%   BMI 38.88 kg/m  General:   Alert,  pleasant and cooperative in NAD Head:  Normocephalic and  atraumatic. Neck:  Supple; no masses or thyromegaly. Lungs:  Clear throughout to auscultation, normal respiratory effort.    Heart:  +  S1, +S2, Regular rate and rhythm, No edema. Abdomen:  Soft, nontender and nondistended. Normal bowel sounds, without guarding, and without rebound.   Neurologic:  Alert and  oriented x4;  grossly normal neurologically.  Impression/Plan: Ernest [name] is here for a colonoscopy and EGD for iron deficiency anemia.   Risks, benefits, limitations, and alternatives regarding the procedures have been reviewed with the patient.  Questions have been answered.  All parties agreeable.   Virgel Manifold, MD  06/26/2018, 11:06 AM

## 2018-06-26 NOTE — Anesthesia Preprocedure Evaluation (Signed)
Anesthesia Evaluation  Patient identified by MRN, date of birth, ID band Patient awake    Reviewed: Allergy & Precautions, H&P , NPO status , Patient's Chart, lab work & pertinent test results, reviewed documented beta blocker date and time   History of Anesthesia Complications Negative for: history of anesthetic complications  Airway Mallampati: III  TM Distance: >3 FB Neck ROM: full    Dental  (+) Dental Advidsory Given, Edentulous Lower, Edentulous Upper   Pulmonary neg pulmonary ROS, asthma , sleep apnea , COPD, former smoker,     + decreased breath sounds      Cardiovascular Exercise Tolerance: Good hypertension, + CAD (non-obstructive)  negative cardio ROS  (-) dysrhythmias  Rhythm:irregular Rate:Normal     Neuro/Psych negative neurological ROS  negative psych ROS   GI/Hepatic negative GI ROS, Neg liver ROS, GERD  ,  Endo/Other  negative endocrine ROSdiabetes, Type 2  Renal/GU CRFRenal diseasenegative Renal ROS  negative genitourinary   Musculoskeletal   Abdominal   Peds  Hematology negative hematology ROS (+) anemia ,   Anesthesia Other Findings Past Medical History: No date: Asthma No date: COPD (chronic obstructive pulmonary disease) (HCC) No date: Diabetes mellitus     Comment:  Type II No date: Early Pulmonary fibrosis (HCC) No date: GERD (gastroesophageal reflux disease) No date: History of echocardiogram     Comment:  a. 02/2017 Echo: EF 60-65%, no rwma, mild MR, mod dil LA.              Nl RV fxn. PASP 13mmHg. No date: Hyperlipidemia No date: Hypertension No date: Morbid obesity ([place]) No date: Non-obstructive CAD (coronary artery disease)     Comment:  a. 2011 Cath: nonobs dzs; b. 07/2014 Cath: LM 30, LAD nl,              LCX nl, RCA 20p, 1m. No date: Permanent atrial fibrillation (HCC)     Comment:  a. CHA2DS2VASc = 4-->xarelto.  Past Surgical History: No date: ANKLE SURGERY 05/2010:  CARDIAC CATHETERIZATION     Comment:  [facility] No date: CARDIAC CATHETERIZATION     Comment:  [facility] No date: CARDIAC CATHETERIZATION     Comment:  [facility] 07/2014: CARDIAC CATHETERIZATION     Comment:  [facility] No date: COLONOSCOPY 70's: TESTICLE SURGERY No date: TUMOR EXCISION     Comment:  Neck and finger; benign No date: WRIST SURGERY  BMI    Body Mass Index:  38.88 kg/m     Reproductive/Obstetrics negative OB ROS                             Anesthesia Physical Anesthesia Plan  ASA: IV  Anesthesia Plan: General   Post-op Pain Management:    Induction:   PONV Risk Score and Plan:   Airway Management Planned:   Additional Equipment:   Intra-op Plan:   Post-operative Plan:   Informed Consent:   Dental Advisory Given  Plan Discussed with: Anesthesiologist, CRNA and Surgeon  Anesthesia Plan Comments:         Anesthesia Quick Evaluation

## 2018-06-26 NOTE — Transfer of Care (Signed)
Immediate Anesthesia Transfer of Care Note  Patient: Ernest [name]  Procedure(s) Performed: Procedure(s): COLONOSCOPY WITH PROPOFOL (N/A) ESOPHAGOGASTRODUODENOSCOPY (EGD) WITH PROPOFOL (N/A)  Patient Location: PACU and Endoscopy Unit  Anesthesia Type:General  Level of Consciousness: sedated  Airway & Oxygen Therapy: Patient Spontanous Breathing and Patient connected to nasal cannula oxygen  Post-op Assessment: Report given to RN and Post -op Vital signs reviewed and stable  Post vital signs: Reviewed and stable  Last Vitals:  Vitals:   06/26/18 1056 06/26/18 1240  BP: (!) 140/96 109/80  Pulse: (!) 110   Resp: 16 16  Temp: (!) 36.1 C (!) 36.2 C  SpO2: 40% 68%    Complications: No apparent anesthesia complications

## 2018-06-28 ENCOUNTER — Telehealth: Payer: Self-pay

## 2018-06-28 ENCOUNTER — Other Ambulatory Visit: Payer: Self-pay

## 2018-06-28 DIAGNOSIS — D500 Iron deficiency anemia secondary to blood loss (chronic): Secondary | ICD-10-CM

## 2018-06-28 LAB — SURGICAL PATHOLOGY

## 2018-06-28 NOTE — Telephone Encounter (Signed)
LVM for pt to contact office to inform him of capsule study scheduled for 07/11/18.  Thanks [employer]

## 2018-06-30 ENCOUNTER — Encounter: Payer: Self-pay | Admitting: Gastroenterology

## 2018-07-02 ENCOUNTER — Inpatient Hospital Stay (HOSPITAL_BASED_OUTPATIENT_CLINIC_OR_DEPARTMENT_OTHER): Payer: Medicare Other | Admitting: Oncology

## 2018-07-02 ENCOUNTER — Encounter: Payer: Self-pay | Admitting: Oncology

## 2018-07-02 ENCOUNTER — Inpatient Hospital Stay: Payer: Medicare Other | Attending: Oncology

## 2018-07-02 VITALS — BP 130/80 | HR 92 | Temp 96.8°F | Resp 18 | Ht 70.0 in | Wt 275.5 lb

## 2018-07-02 DIAGNOSIS — I482 Chronic atrial fibrillation: Secondary | ICD-10-CM | POA: Diagnosis not present

## 2018-07-02 DIAGNOSIS — J8410 Pulmonary fibrosis, unspecified: Secondary | ICD-10-CM | POA: Insufficient documentation

## 2018-07-02 DIAGNOSIS — E538 Deficiency of other specified B group vitamins: Secondary | ICD-10-CM

## 2018-07-02 DIAGNOSIS — Z7901 Long term (current) use of anticoagulants: Secondary | ICD-10-CM

## 2018-07-02 DIAGNOSIS — I10 Essential (primary) hypertension: Secondary | ICD-10-CM

## 2018-07-02 DIAGNOSIS — K219 Gastro-esophageal reflux disease without esophagitis: Secondary | ICD-10-CM

## 2018-07-02 DIAGNOSIS — Z87891 Personal history of nicotine dependence: Secondary | ICD-10-CM | POA: Insufficient documentation

## 2018-07-02 DIAGNOSIS — Z7984 Long term (current) use of oral hypoglycemic drugs: Secondary | ICD-10-CM

## 2018-07-02 DIAGNOSIS — D509 Iron deficiency anemia, unspecified: Secondary | ICD-10-CM | POA: Insufficient documentation

## 2018-07-02 DIAGNOSIS — Z86010 Personal history of colonic polyps: Secondary | ICD-10-CM | POA: Diagnosis not present

## 2018-07-02 DIAGNOSIS — E785 Hyperlipidemia, unspecified: Secondary | ICD-10-CM | POA: Insufficient documentation

## 2018-07-02 DIAGNOSIS — D500 Iron deficiency anemia secondary to blood loss (chronic): Secondary | ICD-10-CM

## 2018-07-02 DIAGNOSIS — E119 Type 2 diabetes mellitus without complications: Secondary | ICD-10-CM

## 2018-07-02 DIAGNOSIS — J449 Chronic obstructive pulmonary disease, unspecified: Secondary | ICD-10-CM

## 2018-07-02 DIAGNOSIS — Z79899 Other long term (current) drug therapy: Secondary | ICD-10-CM | POA: Diagnosis not present

## 2018-07-02 LAB — CBC WITH DIFFERENTIAL/PLATELET
Basophils Absolute: 0 10*3/uL (ref 0–0.1)
Basophils Relative: 1 %
Eosinophils Absolute: 0.1 10*3/uL (ref 0–0.7)
Eosinophils Relative: 2 %
HCT: 41.1 % (ref 40.0–52.0)
Hemoglobin: 13.9 g/dL (ref 13.0–18.0)
Lymphocytes Relative: 49 %
Lymphs Abs: 3.8 10*3/uL — ABNORMAL HIGH (ref 1.0–3.6)
MCH: 29.4 pg (ref 26.0–34.0)
MCHC: 33.9 g/dL (ref 32.0–36.0)
MCV: 86.7 fL (ref 80.0–100.0)
Monocytes Absolute: 0.5 10*3/uL (ref 0.2–1.0)
Monocytes Relative: 6 %
Neutro Abs: 3.3 10*3/uL (ref 1.4–6.5)
Neutrophils Relative %: 42 %
Platelets: 155 10*3/uL (ref 150–440)
RBC: 4.74 MIL/uL (ref 4.40–5.90)
RDW: 16.5 % — ABNORMAL HIGH (ref 11.5–14.5)
WBC: 7.7 10*3/uL (ref 3.8–10.6)

## 2018-07-02 LAB — IRON AND TIBC
Iron: 73 ug/dL (ref 45–182)
Saturation Ratios: 21 % (ref 17.9–39.5)
TIBC: 351 ug/dL (ref 250–450)
UIBC: 278 ug/dL

## 2018-07-02 LAB — FERRITIN: Ferritin: 385 ng/mL — ABNORMAL HIGH (ref 24–336)

## 2018-07-02 NOTE — Progress Notes (Signed)
Hematology/Oncology Consult note [facility]  [phone] [phone]  Patient Care Team: Guadalupe Maple, MD as PCP - General (Family Medicine) Minna Merritts, MD as PCP - Cardiology (Cardiology)   Name of the patient: Chapman Matteucci  [id number]  04-Nov-1951   Date of visit: 07/02/18  Diagnosis- iron deficiency anemia  Chief complaint/ Reason for visit- routine f/u of iron deficiency anemia  Heme/Onc history: patient is a 67 year old male who was recently seen by GI for iron deficiency anemia.  He was also found to have some hemoptysis in March 2019 and is being followed by pulmonary.  CT chest did not reveal any significant abnormalities.  He is on Xarelto for his A. fib.  He has had EGD and colonoscopy in 2013 and 2015.  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  EGD was normal.      Colonoscopy on 06/26/2018 showed 3 polyps in the transverse colon and one polyp in the sigmoid colon.  They were negative for malignancy.  No active bleeding noted. EGD showed salmon-colored mucosa suspicious for short segment Barrett's esophagus.  Erythematous mucosa in the antrum.  Few gastric polyps.  Normal duodenal bulb  He has been referred to [place] for iron deficiency anemia.  He reports taking 1 iron pill daily.  Denies any consistent use of NSAIDs.  His stools have been dark since he has been on oral iron.  Denies any bright red blood in stools.  Recent CBC from 02/21/2018 showed white count of 8.7, H&H of 12.9/40 with an MCV of 78 and a platelet count of 227.  Patient's hemoglobin has been between 12-13 over the last 1 year.Last iron studies checked in February 2019 revealed a low normal ferritin of 35.  Iron studies showed an elevated TIBC of 535 and a low iron saturation of 8%.  Repeat blood work in May 2019 showed low normal B12 levels at 282.  Celiac disease panel was  negative.  Stool antigen for H. pylori was negative.  Urinalysis did not reveal any hematuria.  Folate level was normal.  Iron saturation was low normal at 14%  Patient received 2 doses of Feraheme in May 2019  Interval history- patient had a good trip to [place] where he walked for 4 miles without any difficulty. Reports his enrgy levels are better  ECOG PS- 0 Pain scale- 0   Review of systems- Review of Systems  Constitutional: Negative for chills, fever, malaise/fatigue and weight loss.  HENT: Negative for congestion, ear discharge and nosebleeds.   Eyes: Negative for blurred vision.  Respiratory: Negative for cough, hemoptysis, sputum production, shortness of breath and wheezing.   Cardiovascular: Negative for chest pain, palpitations, orthopnea and claudication.  Gastrointestinal: Negative for abdominal pain, blood in stool, constipation, diarrhea, heartburn, melena, nausea and vomiting.  Genitourinary: Negative for dysuria, flank pain, frequency, hematuria and urgency.  Musculoskeletal: Negative for back pain, joint pain and myalgias.  Skin: Negative for rash.  Neurological: Negative for dizziness, tingling, focal weakness, seizures, weakness and headaches.  Endo/Heme/Allergies: Does not bruise/bleed easily.  Psychiatric/Behavioral: Negative for depression and suicidal ideas. The patient does not have insomnia.       No Known Allergies   Past Medical History:  Diagnosis Date  . Asthma   . COPD (chronic obstructive pulmonary disease) ([place])   . Diabetes mellitus    Type II  . Early Pulmonary fibrosis ([place])   .  GERD (gastroesophageal reflux disease)   . History of echocardiogram    a. 02/2017 Echo: EF 60-65%, no rwma, mild MR, mod dil LA. Nl RV fxn. PASP 71mmHg.  Marland Kitchen Hyperlipidemia   . Hypertension   . Morbid obesity ([place])   . Non-obstructive CAD (coronary artery disease)    a. 2011 Cath: nonobs dzs; b. 07/2014 Cath: LM 30, LAD nl, LCX nl, RCA 20p, 55m.  Marland Kitchen Permanent atrial  fibrillation (HCC)    a. CHA2DS2VASc = 4-->xarelto.     Past Surgical History:  Procedure Laterality Date  . ANKLE SURGERY    . CARDIAC CATHETERIZATION  05/2010   [facility]  . CARDIAC CATHETERIZATION     [facility]  . C[facility]IZATION     [facility]  . CARDIAC CATHETERIZATION  07/2014   [facility]  . COLONOSCOPY    . COLONOSCOPY WITH PROPOFOL N/A 06/26/2018   Procedure: COLONOSCOPY WITH PROPOFOL;  Surgeon: Virgel Manifold, MD;  Location: [facility] ENDOSCOPY;  Service: Endoscopy;  Laterality: N/A;  . ESOPHAGOGASTRODUODENOSCOPY (EGD) WITH PROPOFOL N/A 06/26/2018   Procedure: ESOPHAGOGASTRODUODENOSCOPY (EGD) WITH PROPOFOL;  Surgeon: Virgel Manifold, MD;  Location: [facility] ENDOSCOPY;  Service: Endoscopy;  Laterality: N/A;  . TESTICLE SURGERY  70's  . TUMOR EXCISION     Neck and finger; benign  . WRIST SURGERY      Social History   Socioeconomic History  . Marital status: Widowed    Spouse name: Not on file  . Number of children: Not on file  . Years of education: Not on file  . Highest education level: Not on file  Occupational History  . Occupation: Full time  Social Needs  . Financial resource strain: Not hard at all  . Food insecurity:    Worry: Never true    Inability: Never true  . Transportation needs:    Medical: No    Non-medical: No  Tobacco Use  . Smoking status: Former Smoker    Packs/day: 4.00    Years: 28.00    Pack years: 112.00    Types: Cigarettes    Last attempt to quit: 08/25/1993    Years since quitting: 24.8  . Smokeless tobacco: Never Used  Substance and Sexual Activity  . Alcohol use: Yes    Comment: rarely  . Drug use: No  . Sexual activity: Not on file  Lifestyle  . Physical activity:    Days per week: 0 days    Minutes per session: 0 min  . Stress: Not at all  Relationships  . Social connections:    Talks on phone: More than three times a week    Gets together: More than three times a week    Attends religious service: Never    Active member of  club or organization: No    Attends meetings of clubs or organizations: Never    Relationship status: Widowed  . Intimate partner violence:    Fear of current or ex partner: No    Emotionally abused: No    Physically abused: No    Forced sexual activity: No  Other Topics Concern  . Not on file  Social History Narrative   No regular exercise.    Family History  Problem Relation Age of Onset  . Hypertension Mother   . Hyperlipidemia Mother   . Diabetes Mother   . Heart disease Mother        CABG  . Pneumonia Father        rare type  . Diabetes Other   .  Depression Other   . Coronary artery disease Other   . Alcohol abuse Other   . Hypertension Other   . Hyperlipidemia Other   . Cancer Brother   . Kidney Stones Brother      Current Outpatient Medications:  .  Albuterol Sulfate 108 (90 Base) MCG/ACT AEPB, Inhale 2 Act into the lungs every 4 (four) hours as needed., Disp: 1 each, Rfl: 12 .  atorvastatin (LIPITOR) 40 MG tablet, TAKE 1 TABLET BY MOUTH  DAILY AT 6 PM., Disp: 90 tablet, Rfl: 4 .  budesonide-formoterol (SYMBICORT) 160-4.5 MCG/ACT inhaler, Inhale 2 puffs into the lungs 2 (two) times daily., Disp: 1 Inhaler, Rfl: 12 .  canagliflozin (INVOKANA) 300 MG TABS tablet, Take 1 tablet (300 mg total) by mouth daily before breakfast., Disp: 30 tablet, Rfl: 3 .  diltiazem (CARDIZEM CD) 180 MG 24 hr capsule, Take 1 capsule (180 mg total) by mouth daily., Disp: 90 capsule, Rfl: 3 .  furosemide (LASIX) 40 MG tablet, Take 0.5 tablets (20 mg total) by mouth 2 (two) times daily., Disp: 180 tablet, Rfl: 4 .  gabapentin (NEURONTIN) 600 MG tablet, TAKE 2 TABLETS BY MOUTH AT  BEDTIME, Disp: 180 tablet, Rfl: 4 .  glipiZIDE (GLUCOTROL) 5 MG tablet, TAKE 1 TABLET BY MOUTH  DAILY BEFORE BREAKFAST, Disp: 90 tablet, Rfl: 4 .  isosorbide mononitrate (IMDUR) 60 MG 24 hr tablet, TAKE 1 TABLET BY MOUTH  DAILY, Disp: 90 tablet, Rfl: 4 .  metFORMIN (GLUCOPHAGE) 500 MG tablet, Take 2 tablets (1,000 mg  total) by mouth 2 (two) times daily., Disp: 360 tablet, Rfl: 4 .  metoprolol succinate (TOPROL-XL) 25 MG 24 hr tablet, Take 1 tablet (25 mg total) by mouth daily., Disp: 30 tablet, Rfl: 3 .  omeprazole (PRILOSEC) 20 MG capsule, Take 20 mg by mouth 2 (two) times daily before a meal. , Disp: , Rfl:  .  ramipril (ALTACE) 10 MG capsule, Take 1 capsule (10 mg total) by mouth daily., Disp: 30 capsule, Rfl: 3 .  rivaroxaban (XARELTO) 20 MG TABS tablet, Take 1 tablet (20 mg total) by mouth daily with supper., Disp: 30 tablet, Rfl: 7 .  albuterol (PROVENTIL HFA;VENTOLIN HFA) 108 (90 Base) MCG/ACT inhaler, Inhale 2 puffs into the lungs every 4 (four) hours as needed for wheezing or shortness of breath. (Patient not taking: Reported on 07/02/2018), Disp: 1 Inhaler, Rfl: 6 .  predniSONE (DELTASONE) 20 MG tablet, Take 2 tablets (40 mg total) by mouth daily with breakfast. 10 days (Patient not taking: Reported on 06/26/2018), Disp: 20 tablet, Rfl: 0  Physical exam:  Vitals:   07/02/18 1426  BP: 130/80  Pulse: 92  Resp: 18  Temp: (!) 96.8 F (36 C)  TempSrc: Tympanic  Weight: 275 lb 8 oz (125 kg)  Height: 5\' 10"  (1.778 m)   Physical Exam  Constitutional: He is oriented to person, place, and time. He appears well-developed and well-nourished.  HENT:  Head: Normocephalic and atraumatic.  Eyes: Pupils are equal, round, and reactive to light. EOM are normal.  Neck: Normal range of motion.  Cardiovascular: Normal rate, regular rhythm and normal heart sounds.  Pulmonary/Chest: Effort normal and breath sounds normal.  Abdominal: Soft. Bowel sounds are normal.  Neurological: He is alert and oriented to person, place, and time.  Skin: Skin is warm and dry.     CMP Latest Ref Rng & Units 02/21/2018  Glucose 65 - 99 mg/dL 168(H)  BUN 8 - 27 mg/dL 17  Creatinine 0.76 - 1.27  mg/dL 0.96  Sodium 134 - 144 mmol/L 141  Potassium 3.5 - 5.2 mmol/L 4.1  Chloride 96 - 106 mmol/L 100  CO2 20 - 29 mmol/L 25  Calcium  8.6 - 10.2 mg/dL 9.7  Total Protein 6.0 - 8.5 g/dL -  Total Bilirubin 0.0 - 1.2 mg/dL -  Alkaline Phos 39 - 117 IU/L -  AST 0 - 40 IU/L -  ALT 0 - 44 IU/L -   CBC Latest Ref Rng & Units 07/02/2018  WBC 3.8 - 10.6 K/uL 7.7  Hemoglobin 13.0 - 18.0 g/dL 13.9  Hematocrit 40.0 - 52.0 % 41.1  Platelets 150 - 440 K/uL 155      Assessment and plan- Patient is a 67 y.o. male with iron deficiency anemia possibley due to barretts esophagus  Microcytosis has resolved. Hb is normal. Iron levels are pending. I do not foresee that he will need IV iron at this time. He is on oral b12 for borderline low b12 levels.  I will repeat cbc ferritin and iron studies in 4 and 8 months. See him back in 8 months. Check b12 in 8 months as well   Visit Diagnosis 1. Iron deficiency anemia, unspecified iron deficiency anemia type   2. B12 deficiency      Dr. [name], MD, MPH [facility] at [facility] [phone] 07/02/2018 2:58 PM

## 2018-07-02 NOTE — Progress Notes (Signed)
No new changes noted today 

## 2018-07-04 NOTE — Telephone Encounter (Signed)
Left message again regarding small bowel caps study is scheduled for 7/25. Also have results from colonoscopy.

## 2018-07-08 ENCOUNTER — Ambulatory Visit (INDEPENDENT_AMBULATORY_CARE_PROVIDER_SITE_OTHER): Payer: Medicare Other | Admitting: Family Medicine

## 2018-07-08 ENCOUNTER — Encounter: Payer: Self-pay | Admitting: Family Medicine

## 2018-07-08 VITALS — BP 146/92 | HR 87 | Temp 98.0°F | Wt 273.4 lb

## 2018-07-08 DIAGNOSIS — J342 Deviated nasal septum: Secondary | ICD-10-CM | POA: Diagnosis not present

## 2018-07-08 DIAGNOSIS — W57XXXA Bitten or stung by nonvenomous insect and other nonvenomous arthropods, initial encounter: Secondary | ICD-10-CM

## 2018-07-08 DIAGNOSIS — L237 Allergic contact dermatitis due to plants, except food: Secondary | ICD-10-CM

## 2018-07-08 MED ORDER — DOXYCYCLINE HYCLATE 100 MG PO TABS: 100.0000 mg | ORAL_TABLET | Freq: Two times a day (BID) | ORAL | 0 refills | Status: DC

## 2018-07-08 MED ORDER — TRIAMCINOLONE ACETONIDE 0.5 % EX OINT: 1.0000 "application " | TOPICAL_OINTMENT | Freq: Two times a day (BID) | CUTANEOUS | 0 refills | Status: DC

## 2018-07-08 MED ORDER — PREDNISONE 50 MG PO TABS: 50.0000 mg | ORAL_TABLET | Freq: Every day | ORAL | 0 refills | Status: DC

## 2018-07-08 NOTE — Telephone Encounter (Signed)
Pt aware and prep instructions given.

## 2018-07-08 NOTE — Progress Notes (Signed)
BP (!) 146/92 (BP Location: Left Arm, Patient Position: Sitting, Cuff Size: Large)   Pulse 87   Temp 98 F (36.7 C)   Wt 273 lb 6 oz (124 kg)   SpO2 97%   BMI 39.23 kg/m    Subjective:    Patient ID: Ernest [name], male    DOB: May 01, 1951, 67 y.o.   MRN: [id number]  HPI: Ernest [name] is a 67 y.o. male  Chief Complaint  Patient presents with  . Rash  . Tick Bite   Bit by a tick about 1.5 weeks ago. No additional joint pain. No fevers. No headaches. Otherwise feeling well.   Has been having trouble breathing out of the L side of his nose since he fell and had a facial injury. Keeping him up at night. He would like to see ENT.  RASH Duration:  1.5 weeks  Location: ankles  Itching: yes Burning: no Redness: yes Oozing: no Scaling: no Blisters: no Painful: no Fevers: no Change in detergents/soaps/personal care products: no Recent illness: no Recent travel:no History of same: no Context: stable Alleviating factors: nothing Treatments attempted:nothing Shortness of breath: no  Throat/tongue swelling: no Myalgias/arthralgias: no   Relevant past medical, surgical, family and social history reviewed and updated as indicated. Interim medical history since our last visit reviewed. Allergies and medications reviewed and updated.  Review of Systems  Constitutional: Negative.   Respiratory: Negative.   Cardiovascular: Negative.   Musculoskeletal: Positive for arthralgias. Negative for back pain, gait problem, joint swelling, myalgias, neck pain and neck stiffness.  Skin: Positive for rash. Negative for color change, pallor and wound.  Hematological: Negative.   Psychiatric/Behavioral: Negative.     Per HPI unless specifically indicated above     Objective:    BP (!) 146/92 (BP Location: Left Arm, Patient Position: Sitting, Cuff Size: Large)   Pulse 87   Temp 98 F (36.7 C)   Wt 273 lb 6 oz (124 kg)   SpO2 97%   BMI 39.23 kg/m   Wt Readings from  Last 3 Encounters:  07/08/18 273 lb 6 oz (124 kg)  07/02/18 275 lb 8 oz (125 kg)  06/26/18 271 lb (122.9 kg)    Physical Exam  Constitutional: He is oriented to person, place, and time. He appears well-developed and well-nourished. No distress.  HENT:  Head: Normocephalic and atraumatic.  Right Ear: Hearing normal.  Left Ear: Hearing normal.  Nose: Nose normal.  Eyes: Conjunctivae and lids are normal. Right eye exhibits no discharge. Left eye exhibits no discharge. No scleral icterus.  Cardiovascular: Normal rate, regular rhythm, normal heart sounds and intact distal pulses. Exam reveals no gallop and no friction rub.  No murmur heard. Pulmonary/Chest: Effort normal and breath sounds normal. No stridor. No respiratory distress. He has no wheezes. He has no rales. He exhibits no tenderness.  Musculoskeletal: Normal range of motion.  Neurological: He is alert and oriented to person, place, and time.  Skin: Skin is warm, dry and intact. Capillary refill takes less than 2 seconds. Rash noted. He is not diaphoretic. No erythema. No pallor.  Psychiatric: He has a normal mood and affect. His speech is normal and behavior is normal. Judgment and thought content normal. Cognition and memory are normal.  Nursing note and vitals reviewed.   Results for orders placed or performed in visit on 07/02/18  Iron and TIBC  Result Value Ref Range   Iron 73 45 - 182 ug/dL   TIBC 351 250 -  450 ug/dL   Saturation Ratios 21 17.9 - 39.5 %   UIBC 278 ug/dL  Ferritin  Result Value Ref Range   Ferritin 385 (H) 24 - 336 ng/mL  CBC with Differential  Result Value Ref Range   WBC 7.7 3.8 - 10.6 K/uL   RBC 4.74 4.40 - 5.90 MIL/uL   Hemoglobin 13.9 13.0 - 18.0 g/dL   HCT 41.1 40.0 - 52.0 %   MCV 86.7 80.0 - 100.0 fL   MCH 29.4 26.0 - 34.0 pg   MCHC 33.9 32.0 - 36.0 g/dL   RDW 16.5 (H) 11.5 - 14.5 %   Platelets 155 150 - 440 K/uL   Neutrophils Relative % 42 %   Neutro Abs 3.3 1.4 - 6.5 K/uL   Lymphocytes  Relative 49 %   Lymphs Abs 3.8 (H) 1.0 - 3.6 K/uL   Monocytes Relative 6 %   Monocytes Absolute 0.5 0.2 - 1.0 K/uL   Eosinophils Relative 2 %   Eosinophils Absolute 0.1 0 - 0.7 K/uL   Basophils Relative 1 %   Basophils Absolute 0.0 0 - 0.1 K/uL      Assessment & Plan:   Problem List Items Addressed This Visit    None    Visit Diagnoses    Allergic contact dermatitis due to plants, except food    -  Primary   Looks like poison ivy. Will treat with burst of prednisone followed by PRN triamcinalone cream. Call with any concerns.    Tick bite, initial encounter       Will treat empirically with doxy. If starts to feel really bad in 4-5 weeks, call and we will do blood work.    Deviated septum       Can't breath out of L nostril. Having a lot of issues with it. Would like to see ENT. Referral generated today.   Relevant Orders   Ambulatory referral to ENT       Follow up plan: Return if symptoms worsen or fail to improve.

## 2018-07-10 ENCOUNTER — Telehealth: Payer: Self-pay | Admitting: Gastroenterology

## 2018-07-10 NOTE — Telephone Encounter (Signed)
Pt left vm to find out what time his procedure is

## 2018-07-11 NOTE — Telephone Encounter (Signed)
I spoke with pt yesterday and due to a conflict procedure was rescheduled to 8/5. He is to call the endo unit for time of procedure the day before.

## 2018-07-19 DIAGNOSIS — R233 Spontaneous ecchymoses: Secondary | ICD-10-CM | POA: Diagnosis not present

## 2018-07-19 DIAGNOSIS — J342 Deviated nasal septum: Secondary | ICD-10-CM | POA: Diagnosis not present

## 2018-07-19 DIAGNOSIS — T444X5A Adverse effect of predominantly alpha-adrenoreceptor agonists, initial encounter: Secondary | ICD-10-CM | POA: Diagnosis not present

## 2018-07-19 DIAGNOSIS — R0981 Nasal congestion: Secondary | ICD-10-CM | POA: Diagnosis not present

## 2018-07-22 ENCOUNTER — Encounter
Admission: RE | Disposition: A | Discharge status: Home / Self Care | Payer: Self-pay | Source: Ambulatory Visit | Attending: Gastroenterology

## 2018-07-22 ENCOUNTER — Ambulatory Visit
Admission: RE | Admit: 2018-07-22 | Discharge: 2018-07-22 | Disposition: A | Discharge status: Home / Self Care | Payer: Medicare Other | Source: Ambulatory Visit | Attending: Gastroenterology | Admitting: Gastroenterology

## 2018-07-22 DIAGNOSIS — D509 Iron deficiency anemia, unspecified: Secondary | ICD-10-CM

## 2018-07-22 SURGERY — IMAGING PROCEDURE, GI TRACT, INTRALUMINAL, VIA CAPSULE
Anesthesia: General

## 2018-07-23 SURGERY — IMAGING PROCEDURE, GI TRACT, INTRALUMINAL, VIA CAPSULE

## 2018-07-30 ENCOUNTER — Encounter: Payer: Self-pay | Admitting: Gastroenterology

## 2018-07-31 ENCOUNTER — Other Ambulatory Visit: Payer: Self-pay

## 2018-07-31 ENCOUNTER — Telehealth: Payer: Self-pay

## 2018-07-31 DIAGNOSIS — K5521 Angiodysplasia of colon with hemorrhage: Secondary | ICD-10-CM

## 2018-07-31 NOTE — Telephone Encounter (Signed)
Pt came by office today (lost number) and scheduled his procedure (enteroscopy) due to findings on his Giv[place] caps study. He had active bleeding AVM's.  This has been scheduled for 08/05/2018 with Dr. Bonna Gains in pm. Pt given prep instructions. I encouraged pt to go to ER if he develops any signs of active GI bleeding. He said he would.

## 2018-08-02 ENCOUNTER — Encounter: Payer: Self-pay | Admitting: Emergency Medicine

## 2018-08-05 ENCOUNTER — Ambulatory Visit: Payer: Medicare Other | Admitting: Anesthesiology

## 2018-08-05 ENCOUNTER — Ambulatory Visit
Admission: RE | Admit: 2018-08-05 | Discharge: 2018-08-05 | Disposition: A | Discharge status: Home / Self Care | Payer: Medicare Other | Source: Ambulatory Visit | Attending: Gastroenterology | Admitting: Gastroenterology

## 2018-08-05 ENCOUNTER — Encounter: Payer: Self-pay | Admitting: *Deleted

## 2018-08-05 ENCOUNTER — Encounter
Admission: RE | Disposition: A | Discharge status: Home / Self Care | Payer: Self-pay | Source: Ambulatory Visit | Attending: Gastroenterology

## 2018-08-05 DIAGNOSIS — Z87891 Personal history of nicotine dependence: Secondary | ICD-10-CM | POA: Diagnosis not present

## 2018-08-05 DIAGNOSIS — I10 Essential (primary) hypertension: Secondary | ICD-10-CM | POA: Insufficient documentation

## 2018-08-05 DIAGNOSIS — Z6838 Body mass index (BMI) 38.0-38.9, adult: Secondary | ICD-10-CM | POA: Insufficient documentation

## 2018-08-05 DIAGNOSIS — G4730 Sleep apnea, unspecified: Secondary | ICD-10-CM | POA: Diagnosis not present

## 2018-08-05 DIAGNOSIS — J8410 Pulmonary fibrosis, unspecified: Secondary | ICD-10-CM | POA: Insufficient documentation

## 2018-08-05 DIAGNOSIS — I482 Chronic atrial fibrillation: Secondary | ICD-10-CM | POA: Diagnosis not present

## 2018-08-05 DIAGNOSIS — E119 Type 2 diabetes mellitus without complications: Secondary | ICD-10-CM | POA: Diagnosis not present

## 2018-08-05 DIAGNOSIS — K5521 Angiodysplasia of colon with hemorrhage: Secondary | ICD-10-CM

## 2018-08-05 DIAGNOSIS — K31819 Angiodysplasia of stomach and duodenum without bleeding: Secondary | ICD-10-CM | POA: Diagnosis not present

## 2018-08-05 DIAGNOSIS — Z7984 Long term (current) use of oral hypoglycemic drugs: Secondary | ICD-10-CM | POA: Insufficient documentation

## 2018-08-05 DIAGNOSIS — I2510 Atherosclerotic heart disease of native coronary artery without angina pectoris: Secondary | ICD-10-CM | POA: Diagnosis not present

## 2018-08-05 DIAGNOSIS — E785 Hyperlipidemia, unspecified: Secondary | ICD-10-CM | POA: Insufficient documentation

## 2018-08-05 DIAGNOSIS — K5520 Angiodysplasia of colon without hemorrhage: Secondary | ICD-10-CM

## 2018-08-05 DIAGNOSIS — Z7901 Long term (current) use of anticoagulants: Secondary | ICD-10-CM | POA: Insufficient documentation

## 2018-08-05 DIAGNOSIS — K31811 Angiodysplasia of stomach and duodenum with bleeding: Secondary | ICD-10-CM | POA: Diagnosis not present

## 2018-08-05 DIAGNOSIS — J449 Chronic obstructive pulmonary disease, unspecified: Secondary | ICD-10-CM | POA: Insufficient documentation

## 2018-08-05 DIAGNOSIS — Z7951 Long term (current) use of inhaled steroids: Secondary | ICD-10-CM | POA: Diagnosis not present

## 2018-08-05 DIAGNOSIS — Q2733 Arteriovenous malformation of digestive system vessel: Secondary | ICD-10-CM

## 2018-08-05 DIAGNOSIS — I129 Hypertensive chronic kidney disease with stage 1 through stage 4 chronic kidney disease, or unspecified chronic kidney disease: Secondary | ICD-10-CM | POA: Diagnosis not present

## 2018-08-05 DIAGNOSIS — E1122 Type 2 diabetes mellitus with diabetic chronic kidney disease: Secondary | ICD-10-CM | POA: Diagnosis not present

## 2018-08-05 DIAGNOSIS — K219 Gastro-esophageal reflux disease without esophagitis: Secondary | ICD-10-CM | POA: Insufficient documentation

## 2018-08-05 DIAGNOSIS — N183 Chronic kidney disease, stage 3 (moderate): Secondary | ICD-10-CM | POA: Diagnosis not present

## 2018-08-05 HISTORY — PX: ENTEROSCOPY: SHX5533

## 2018-08-05 LAB — GLUCOSE, CAPILLARY: Glucose-Capillary: 177 mg/dL — ABNORMAL HIGH (ref 70–99)

## 2018-08-05 SURGERY — ENTEROSCOPY
Anesthesia: General

## 2018-08-05 MED ORDER — PROPOFOL 500 MG/50ML IV EMUL
INTRAVENOUS | Status: DC | PRN
  Administered 2018-08-05: 175 ug/kg/min via INTRAVENOUS

## 2018-08-05 MED ORDER — PROPOFOL 500 MG/50ML IV EMUL
INTRAVENOUS | Status: AC
  Filled 2018-08-05: qty 50

## 2018-08-05 MED ORDER — PROPOFOL 10 MG/ML IV BOLUS
INTRAVENOUS | Status: DC | PRN
  Administered 2018-08-05: 80 mg via INTRAVENOUS

## 2018-08-05 MED ORDER — LIDOCAINE HCL (CARDIAC) PF 100 MG/5ML IV SOSY
PREFILLED_SYRINGE | INTRAVENOUS | Status: DC | PRN
  Administered 2018-08-05: 50 mg via INTRAVENOUS

## 2018-08-05 MED ORDER — SODIUM CHLORIDE 0.9 % IV SOLN
INTRAVENOUS | Status: DC
  Administered 2018-08-05: 13:00:00 via INTRAVENOUS

## 2018-08-05 MED ORDER — LIDOCAINE HCL (PF) 2 % IJ SOLN
INTRAMUSCULAR | Status: AC
  Filled 2018-08-05: qty 10

## 2018-08-05 NOTE — Anesthesia Procedure Notes (Signed)
Date/Time: 08/05/2018 2:10 PM Performed by: Johnna Acosta, CRNA Pre-anesthesia Checklist: Patient identified, Emergency Drugs available, Suction available, Patient being monitored and Timeout performed Patient Re-evaluated:Patient Re-evaluated prior to induction Oxygen Delivery Method: Nasal cannula Preoxygenation: Pre-oxygenation with 100% oxygen

## 2018-08-05 NOTE — Anesthesia Preprocedure Evaluation (Addendum)
Anesthesia Evaluation  Patient identified by MRN, date of birth, ID band Patient awake    Reviewed: Allergy & Precautions, H&P , NPO status , Patient's Chart, lab work & pertinent test results, reviewed documented beta blocker date and time   History of Anesthesia Complications Negative for: history of anesthetic complications  Airway Mallampati: III  TM Distance: >3 FB Neck ROM: full    Dental  (+) Dental Advidsory Given, Edentulous Lower, Edentulous Upper   Pulmonary neg pulmonary ROS, asthma , sleep apnea , COPD, former smoker,     + decreased breath sounds      Cardiovascular Exercise Tolerance: Good hypertension, + CAD (non-obstructive)  negative cardio ROS  (-) dysrhythmias  Rhythm:irregular Rate:Normal     Neuro/Psych negative neurological ROS  negative psych ROS   GI/Hepatic negative GI ROS, Neg liver ROS, GERD  ,  Endo/Other  negative endocrine ROSdiabetes, Type 2  Renal/GU CRFRenal diseasenegative Renal ROS  negative genitourinary   Musculoskeletal   Abdominal   Peds  Hematology negative hematology ROS (+) anemia ,   Anesthesia Other Findings Past Medical History: No date: Asthma No date: COPD (chronic obstructive pulmonary disease) (HCC) No date: Diabetes mellitus     Comment:  Type II No date: Early Pulmonary fibrosis (HCC) No date: GERD (gastroesophageal reflux disease) No date: History of echocardiogram     Comment:  a. 02/2017 Echo: EF 60-65%, no rwma, mild MR, mod dil LA.              Nl RV fxn. PASP 84mmHg. No date: Hyperlipidemia No date: Hypertension No date: Morbid obesity ([place]) No date: Non-obstructive CAD (coronary artery disease)     Comment:  a. 2011 Cath: nonobs dzs; b. 07/2014 Cath: LM 30, LAD nl,              LCX nl, RCA 20p, 75m. No date: Permanent atrial fibrillation (HCC)     Comment:  a. CHA2DS2VASc = 4-->xarelto.  Past Surgical History: No date: ANKLE SURGERY 05/2010:  CARDIAC CATHETERIZATION     Comment:  [facility] No date: CARDIAC CATHETERIZATION     Comment:  [facility] No date: CARDIAC CATHETERIZATION     Comment:  [facility] 07/2014: CARDIAC CATHETERIZATION     Comment:  [facility] No date: COLONOSCOPY 70's: TESTICLE SURGERY No date: TUMOR EXCISION     Comment:  Neck and finger; benign No date: WRIST SURGERY  BMI    Body Mass Index:  38.88 kg/m     Reproductive/Obstetrics negative OB ROS                             Anesthesia Physical  Anesthesia Plan  ASA: III  Anesthesia Plan: General   Post-op Pain Management:    Induction:   PONV Risk Score and Plan: TIVA and Propofol infusion  Airway Management Planned:   Additional Equipment:   Intra-op Plan:   Post-operative Plan:   Informed Consent: I have reviewed the patients History and Physical, chart, labs and discussed the procedure including the risks, benefits and alternatives for the proposed anesthesia with the patient or authorized representative who has indicated his/her understanding and acceptance.   Dental Advisory Given  Plan Discussed with: Anesthesiologist, CRNA and Surgeon  Anesthesia Plan Comments:         Anesthesia Quick Evaluation

## 2018-08-05 NOTE — Anesthesia Post-op Follow-up Note (Signed)
Anesthesia QCDR form completed.        

## 2018-08-05 NOTE — Anesthesia Postprocedure Evaluation (Signed)
Anesthesia Post Note  Patient: Ernest [name]  Procedure(s) Performed: ENTEROSCOPY (N/A )  Patient location during evaluation: PACU Anesthesia Type: General Level of consciousness: awake and alert Pain management: pain level controlled Vital Signs Assessment: post-procedure vital signs reviewed and stable Respiratory status: spontaneous breathing, nonlabored ventilation and respiratory function stable Cardiovascular status: blood pressure returned to baseline and stable Postop Assessment: no apparent nausea or vomiting Anesthetic complications: no     Last Vitals:  Vitals:   08/05/18 1501 08/05/18 1511  BP: 132/71 125/76  Pulse: 79 82  Resp: 17 19  Temp:    SpO2: 97% 97%    Last Pain:  Vitals:   08/05/18 1511  TempSrc:   PainSc: 0-No pain                 Durenda Hurt

## 2018-08-05 NOTE — H&P (Signed)
[name], MD [address], [place], [place], [name], [address] [address], [place], [place], [name], [address] Phone: [phone]  Fax: [phone]  Primary Care Physician:  Guadalupe Maple, MD   Pre-Procedure History & Physical: HPI:  Ernest [name] is a 67 y.o. male is here for an enteroscopy.   Past Medical History:  Diagnosis Date  . Asthma   . COPD (chronic obstructive pulmonary disease) ([place])   . Diabetes mellitus    Type II  . Early Pulmonary fibrosis ([place])   . GERD (gastroesophageal reflux disease)   . History of echocardiogram    a. 02/2017 Echo: EF 60-65%, no rwma, mild MR, mod dil LA. Nl RV fxn. PASP 73mmHg.  Marland Kitchen Hyperlipidemia   . Hypertension   . Morbid obesity ([place])   . Non-obstructive CAD (coronary artery disease)    a. 2011 Cath: nonobs dzs; b. 07/2014 Cath: LM 30, LAD nl, LCX nl, RCA 20p, 21m.  Marland Kitchen Permanent atrial fibrillation (HCC)    a. CHA2DS2VASc = 4-->xarelto.    Past Surgical History:  Procedure Laterality Date  . ANKLE SURGERY    . CARDIAC CATHETERIZATION  05/2010   [facility]  . CARDIAC CATHETERIZATION     [facility]  . CARDIAC CATHETERIZATION     [facility]  . CARDIAC CATHETERIZATION  07/2014   [facility]  . COLONOSCOPY    . COLONOSCOPY WITH PROPOFOL N/A 06/26/2018   Procedure: COLONOSCOPY WITH PROPOFOL;  Surgeon: Virgel Manifold, MD;  Location: [facility] ENDOSCOPY;  Service: Endoscopy;  Laterality: N/A;  . ESOPHAGOGASTRODUODENOSCOPY (EGD) WITH PROPOFOL N/A 06/26/2018   Procedure: ESOPHAGOGASTRODUODENOSCOPY (EGD) WITH PROPOFOL;  Surgeon: Virgel Manifold, MD;  Location: [facility] ENDOSCOPY;  Service: Endoscopy;  Laterality: N/A;  . TESTICLE SURGERY  70's  . TUMOR EXCISION     Neck and finger; benign  . WRIST SURGERY      Prior to Admission medications   Medication Sig Start Date End Date Taking? Authorizing Provider  Albuterol Sulfate 108 (90 Base) MCG/ACT AEPB Inhale 2 Act into the lungs every 4 (four) hours as needed. 05/03/18  Yes Kasa,  Maretta Bees, MD  atorvastatin (LIPITOR) 40 MG tablet TAKE 1 TABLET BY MOUTH  DAILY AT 6 PM. 02/14/18  Yes Crissman, Jeannette How, MD  budesonide-formoterol (SYMBICORT) 160-4.5 MCG/ACT inhaler Inhale 2 puffs into the lungs 2 (two) times daily. 05/03/18  Yes Flora Lipps, MD  canagliflozin (INVOKANA) 300 MG TABS tablet Take 1 tablet (300 mg total) by mouth daily before breakfast. 05/28/18  Yes Crissman, Jeannette How, MD  diltiazem (CARDIZEM CD) 180 MG 24 hr capsule Take 1 capsule (180 mg total) by mouth daily. 06/14/18 09/12/18 Yes Theora Gianotti, NP  furosemide (LASIX) 40 MG tablet Take 0.5 tablets (20 mg total) by mouth 2 (two) times daily. 03/13/18  Yes Crissman, Jeannette How, MD  gabapentin (NEURONTIN) 600 MG tablet TAKE 2 TABLETS BY MOUTH AT  BEDTIME 02/14/18  Yes Crissman, Mark A, MD  glipiZIDE (GLUCOTROL) 5 MG tablet TAKE 1 TABLET BY MOUTH  DAILY BEFORE BREAKFAST 02/26/18  Yes Crissman, Jeannette How, MD  isosorbide mononitrate (IMDUR) 60 MG 24 hr tablet TAKE 1 TABLET BY MOUTH  DAILY 02/26/18  Yes Crissman, Jeannette How, MD  metFORMIN (GLUCOPHAGE) 500 MG tablet Take 2 tablets (1,000 mg total) by mouth 2 (two) times daily. 03/13/18  Yes Crissman, Jeannette How, MD  metoprolol succinate (TOPROL-XL) 25 MG 24 hr tablet Take 1 tablet (25 mg total) by mouth daily. 05/28/18  Yes Guadalupe Maple, MD  omeprazole (PRILOSEC) 20 MG capsule Take 20 mg by mouth 2 (two) times daily before a meal.    Yes [provider]  ramipril (ALTACE) 10 MG capsule Take 1 capsule (10 mg total) by mouth daily. 05/28/18  Yes Crissman, Jeannette How, MD  rivaroxaban (XARELTO) 20 MG TABS tablet Take 1 tablet (20 mg total) by mouth daily with supper. 03/13/18  Yes Crissman, Jeannette How, MD  triamcinolone ointment (KENALOG) 0.5 % Apply 1 application topically 2 (two) times daily. 07/08/18  Yes Johnson, Megan P, DO  albuterol (PROVENTIL HFA;VENTOLIN HFA) 108 (90 Base) MCG/ACT inhaler Inhale 2 puffs into the lungs every 4 (four) hours as needed for wheezing or shortness of  breath. Patient not taking: Reported on 07/02/2018 05/03/18   Flora Lipps, MD  doxycycline (VIBRA-TABS) 100 MG tablet Take 1 tablet (100 mg total) by mouth 2 (two) times daily. Patient not taking: Reported on 08/05/2018 07/08/18   Park Liter P, DO  predniSONE (DELTASONE) 50 MG tablet Take 1 tablet (50 mg total) by mouth daily with breakfast. Patient not taking: Reported on 08/05/2018 07/08/18   Park Liter P, DO    Allergies as of 08/01/2018  . (No Known Allergies)    Family History  Problem Relation Age of Onset  . Hypertension Mother   . Hyperlipidemia Mother   . Diabetes Mother   . Heart disease Mother        CABG  . Pneumonia Father        rare type  . Diabetes Other   . Depression Other   . Coronary artery disease Other   . Alcohol abuse Other   . Hypertension Other   . Hyperlipidemia Other   . Cancer Brother   . Kidney Stones Brother     Social History   Socioeconomic History  . Marital status: Widowed    Spouse name: Not on file  . Number of children: Not on file  . Years of education: Not on file  . Highest education level: Not on file  Occupational History  . Occupation: Full time  Social Needs  . Financial resource strain: Not hard at all  . Food insecurity:    Worry: Never true    Inability: Never true  . Transportation needs:    Medical: No    Non-medical: No  Tobacco Use  . Smoking status: Former Smoker    Packs/day: 4.00    Years: 28.00    Pack years: 112.00    Types: Cigarettes    Last attempt to quit: 08/25/1993    Years since quitting: 24.9  . Smokeless tobacco: Never Used  Substance and Sexual Activity  . Alcohol use: Yes    Comment: rarely  . Drug use: No  . Sexual activity: Not on file  Lifestyle  . Physical activity:    Days per week: 0 days    Minutes per session: 0 min  . Stress: Not at all  Relationships  . Social connections:    Talks on phone: More than three times a week    Gets together: More than three times a week     Attends religious service: Never    Active member of club or organization: No    Attends meetings of clubs or organizations: Never    Relationship status: Widowed  . Intimate partner violence:    Fear of current or ex partner: No    Emotionally abused: No    Physically abused: No    Forced sexual activity:  No  Other Topics Concern  . Not on file  Social History Narrative   No regular exercise.    Review of Systems: See HPI, otherwise negative ROS  Physical Exam: BP (!) 157/95   Pulse 80   Temp (!) 96.2 F (35.7 C) (Tympanic)   Resp 16   Ht 5\' 10"  (1.778 m)   Wt 122.5 kg   SpO2 97%   BMI 38.74 kg/m  General:   Alert,  pleasant and cooperative in NAD Head:  Normocephalic and atraumatic. Neck:  Supple; no masses or thyromegaly. Lungs:  Clear throughout to auscultation, normal respiratory effort.    Heart:  +S1, +S2, Regular rate and rhythm, No edema. Abdomen:  Soft, nontender and nondistended. Normal bowel sounds, without guarding, and without rebound.   Neurologic:  Alert and  oriented x4;  grossly normal neurologically.  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  Risks, benefits, limitations, and alternatives regarding the procedure have been reviewed with the patient.  Questions have been answered.  All parties agreeable.   Virgel Manifold, MD  08/05/2018, 1:19 PM

## 2018-08-05 NOTE — Op Note (Signed)
[facility] Gastroenterology Patient Name: Ernest [name] Procedure Date: 08/05/2018 2:09 PM MRN: [id number] Account #: [id number] Date of Birth: 1951-07-05 Admit Type: Outpatient Age: 67 Room: [facility] ENDO ROOM 4 Gender: Male Note Status: Finalized Procedure:            Small bowel enteroscopy Indications:          Arteriovenous malformation in the small intestine,                        Video capsule study positive for bleeding 1% into the                        small bowel and 2% into the study. Non bleeding AVMs                        also noted 2% into the small bowel. See capsule report                        under procedure report in epic. Providers:            Lennette Bihari. Bonna Gains MD, MD Referring MD:         Guadalupe Maple, MD (Referring MD) Medicines:            Monitored Anesthesia Care Complications:        No immediate complications. Procedure:            Pre-Anesthesia Assessment:                       - Prior to the procedure, a History and Physical was                        performed, and patient medications and allergies were                        reviewed. The patient is competent. The risks and                        benefits of the procedure and the sedation options and                        risks were discussed with the patient. All questions                        were answered and informed consent was obtained.                        Patient identification and proposed procedure were                        verified by the physician, the nurse, the                        anesthesiologist, the anesthetist and the technician in                        the pre-procedure area in the procedure room in the                        endoscopy suite.  Mental Status Examination: alert and                        oriented. Airway Examination: normal oropharyngeal                        airway and neck mobility. Respiratory Examination:     clear to auscultation. CV Examination: normal.                        Prophylactic Antibiotics: The patient does not require                        prophylactic antibiotics. Prior Anticoagulants: The                        patient has taken no previous anticoagulant or                        antiplatelet agents. ASA Grade Assessment: III - A                        patient with severe systemic disease. After reviewing                        the risks and benefits, the patient was deemed in                        satisfactory condition to undergo the procedure. The                        anesthesia plan was to use monitored anesthesia care                        (MAC). Immediately prior to administration of                        medications, the patient was re-assessed for adequacy                        to receive sedatives. The heart rate, respiratory rate,                        oxygen saturations, blood pressure, adequacy of                        pulmonary ventilation, and response to care were                        monitored throughout the procedure. The physical status                        of the patient was re-assessed after the procedure.                       After obtaining informed consent, the endoscope was                        passed under direct vision. Throughout the procedure,  the patient's blood pressure, pulse, and oxygen                        saturations were monitored continuously. The                        Enteroscope was introduced through the mouth and                        advanced to the jejunum, to the 200 cm mark (from the                        incisors). The small bowel enteroscopy was accomplished                        with ease. The patient tolerated the procedure well. Findings:      The examined esophagus was normal.      A single 4 mm no bleeding angioectasia was found in the stomach.       Coagulation for bleeding  prevention using argon plasma was successful.      Three angioectasias with no bleeding were found in the second portion of       the duodenum. Coagulation for bleeding prevention using argon plasma was       successful.      A single angioectasia with no bleeding was found in the proximal       jejunum. Coagulation for bleeding prevention using argon plasma was       successful.      No actively bleeding lesions seen throughout the exam. Bile fluid seen       without evidence of old or new blood, consistent with resolution of       bleeding seen on small bowel capsule. Impression:           - Normal esophagus.                       - A single non-bleeding angioectasia in the stomach.                        Treated with argon plasma coagulation (APC).                       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                        Treated with argon plasma coagulation (APC).                       - A single non-bleeding angioectasia in the jejunum.                        Treated with argon plasma coagulation (APC).                       - No actively bleeding lesions seen throughout the                        exam. Bile fluid seen without evidence of old or new  blood, consistent with resolution of bleeding seen on                        small bowel capsule.                       - No specimens collected. Recommendation:       - Continue present medications.                       - Return to my office in 4 weeks.                       - Follow up with Hematology/Dr. Janese Banks as previously                        scheduled.                       - Return to primary care physician as previously                        scheduled.                       - Iron levels are being monitored by Hematology,                        continue follow up with them.                       - Resume previous diet.                       - The findings and recommendations were  discussed with                        the patient.                       - The findings and recommendations were discussed with                        the patient's family. Procedure Code(s):    --- Professional ---                       [phone], Small intestinal endoscopy, enteroscopy beyond                        second portion of duodenum, not including ileum; with                        control of bleeding (eg, injection, bipolar cautery,                        unipolar cautery, laser, heater probe, stapler, plasma                        coagulator) Diagnosis Code(s):    --- Professional ---                       K31.819, Angiodysplasia of stomach and duodenum without  bleeding                       K55.20, Angiodysplasia of colon without hemorrhage                       Q27.33, Arteriovenous malformation of digestive system                        vessel CPT copyright 2017 [employer]. All rights reserved. The codes documented in this report are preliminary and upon coder review may  be revised to meet current compliance requirements. Attending Participation:      I personally performed the entire procedure.  [name], MD Margretta Sidle B. Bonna Gains MD, MD 08/05/2018 2:56:41 PM This report has been signed electronically. Number of Addenda: 0 Note Initiated On: 08/05/2018 2:09 PM Estimated Blood Loss: Estimated blood loss: none.      [facility]

## 2018-08-05 NOTE — Transfer of Care (Signed)
Immediate Anesthesia Transfer of Care Note  Patient: Curtez Brallier  Procedure(s) Performed: ENTEROSCOPY (N/A )  Patient Location: PACU  Anesthesia Type:General  Level of Consciousness: sedated  Airway & Oxygen Therapy: Patient Spontanous Breathing and Patient connected to nasal cannula oxygen  Post-op Assessment: Report given to RN and Post -op Vital signs reviewed and stable  Post vital signs: Reviewed and stable  Last Vitals:  Vitals Value Taken Time  BP 117/72 08/05/2018  2:52 PM  Temp 36.2 C 08/05/2018  2:51 PM  Pulse 80 08/05/2018  2:53 PM  Resp 17 08/05/2018  2:53 PM  SpO2 97 % 08/05/2018  2:53 PM  Vitals shown include unvalidated device data.  Last Pain:  Vitals:   08/05/18 1451  TempSrc: Tympanic  PainSc: Asleep         Complications: No apparent anesthesia complications

## 2018-08-07 ENCOUNTER — Encounter: Payer: Self-pay | Admitting: Gastroenterology

## 2018-09-18 ENCOUNTER — Ambulatory Visit: Payer: Medicare Other | Admitting: Family Medicine

## 2018-10-01 ENCOUNTER — Inpatient Hospital Stay: Payer: Medicare Other | Attending: Oncology

## 2018-10-01 DIAGNOSIS — D509 Iron deficiency anemia, unspecified: Secondary | ICD-10-CM

## 2018-10-01 LAB — CBC WITH DIFFERENTIAL/PLATELET
Abs Immature Granulocytes: 0.02 10*3/uL (ref 0.00–0.07)
Basophils Absolute: 0 10*3/uL (ref 0.0–0.1)
Basophils Relative: 1 %
Eosinophils Absolute: 0.2 10*3/uL (ref 0.0–0.5)
Eosinophils Relative: 2 %
HCT: 40.7 % (ref 39.0–52.0)
Hemoglobin: 13.6 g/dL (ref 13.0–17.0)
Immature Granulocytes: 0 %
Lymphocytes Relative: 43 %
Lymphs Abs: 3.4 10*3/uL (ref 0.7–4.0)
MCH: 28.1 pg (ref 26.0–34.0)
MCHC: 33.4 g/dL (ref 30.0–36.0)
MCV: 84.1 fL (ref 80.0–100.0)
Monocytes Absolute: 0.5 10*3/uL (ref 0.1–1.0)
Monocytes Relative: 7 %
Neutro Abs: 3.8 10*3/uL (ref 1.7–7.7)
Neutrophils Relative %: 47 %
Platelets: 160 10*3/uL (ref 150–400)
RBC: 4.84 MIL/uL (ref 4.22–5.81)
RDW: 12.9 % (ref 11.5–15.5)
WBC: 8 10*3/uL (ref 4.0–10.5)
nRBC: 0 % (ref 0.0–0.2)

## 2018-10-01 LAB — FERRITIN: Ferritin: 213 ng/mL (ref 24–336)

## 2018-10-01 LAB — IRON AND TIBC
Iron: 54 ug/dL (ref 45–182)
Saturation Ratios: 14 % — ABNORMAL LOW (ref 17.9–39.5)
TIBC: 390 ug/dL (ref 250–450)
UIBC: 336 ug/dL

## 2018-10-10 ENCOUNTER — Ambulatory Visit: Payer: Medicare Other | Admitting: Internal Medicine

## 2018-10-10 ENCOUNTER — Encounter: Payer: Self-pay | Admitting: Internal Medicine

## 2018-10-10 ENCOUNTER — Encounter: Payer: Self-pay | Admitting: Family Medicine

## 2018-10-10 VITALS — BP 150/92 | HR 98 | Ht 71.0 in | Wt 266.6 lb

## 2018-10-10 DIAGNOSIS — J449 Chronic obstructive pulmonary disease, unspecified: Secondary | ICD-10-CM | POA: Diagnosis not present

## 2018-10-10 DIAGNOSIS — Z23 Encounter for immunization: Secondary | ICD-10-CM

## 2018-10-10 DIAGNOSIS — J439 Emphysema, unspecified: Secondary | ICD-10-CM | POA: Insufficient documentation

## 2018-10-10 NOTE — Progress Notes (Signed)
Name: Ernest [name] MRN: [id number] DOB: [birth year]     CONSULTATION DATE: 5.17.19 REFERRING MD : Jeananne Rama  CHIEF COMPLAINT: SOB and hemoptysis  STUDIES:       3.2019   CT chest Independently reviewed by Me B/l interstitial lung disease Consider early ILD Findings compared to 2018 CT scans-with similar findings   PREVIOUS HPI 67 year old pleasant white male seen today for abnormal CAT scan findings along with chronic shortness of breath chronic dyspnea on exertion with intermittent hemoptysis for the last several years His shortness of breath has been going on for many years Patient had previous history of influenza pneumonia 2 years ago Patient also has associated intermittent wheezing with intermittent cough Patient had's previously smoked 4 packs a day for 20 years however quit 29 years ago patient still works part-time as a Administrator  Overall his shortness of breath and dyspnea on exertion is somewhat debilitating at this time I have reviewed his CAT scan and reviewed findings with the patient as he has early interstitial lung disease and based on his symptoms of wheezing and shortness of breath with cough he may have underlying COPD as well   CC follow up COPD  HPI Follow-up for COPD assessment today Chronic shortness of breath and progressive dyspnea on exertion seems to be stable at this time Is on Symbicort daily and uses albuterol as needed States that he wheezed several times over the last 6 months No signs of heart failure at this time  Patient did have a CAT scan of his chest and March 2019 Early signs of interstitial lung disease  Findings reported to patient today    Review of Systems:  Gen:  Denies  fever, sweats, chills weigh loss  HEENT: Denies blurred vision, double vision, ear pain, eye pain, hearing loss, nose bleeds, sore throat Cardiac:  No dizziness, chest pain or heaviness, chest tightness,edema, No JVD Resp:  +shortness of  breath Gi: Denies swallowing difficulty, stomach pain, nausea or vomiting, diarrhea, constipation, bowel incontinence Gu:  Denies bladder incontinence, burning urine Ext:   Denies Joint pain, stiffness or swelling Skin: Denies  skin rash, easy bruising or bleeding or hives Endoc:  Denies polyuria, polydipsia , polyphagia or weight change Psych:   Denies depression, insomnia or hallucinations  Other:  All other systems negative    BP (!) 150/92 (BP Location: Left Arm, Cuff Size: Normal)   Pulse 98   Ht 5\' 11"  (1.803 m)   Wt 266 lb 9.6 oz (120.9 kg)   SpO2 97%   BMI 37.18 kg/m    Physical Examination:   GENERAL:NAD, no fevers, chills, no weakness no fatigue HEAD: Normocephalic, atraumatic.  EYES: Pupils equal, round, reactive to light. Extraocular muscles intact. No scleral icterus.  MOUTH: Moist mucosal membrane. Dentition intact. No abscess noted.  EAR, NOSE, THROAT: Clear without exudates. No external lesions.  NECK: Supple. No thyromegaly. No nodules. No JVD.  PULMONARY: CTA B/L no wheezing, rhonchi, crackles CARDIOVASCULAR: S1 and S2. Regular rate and rhythm. No murmurs, rubs, or gallops. No edema. Pedal pulses 2+ bilaterally.  GASTROINTESTINAL: Soft, nontender, nondistended. No masses. Positive bowel sounds. No hepatosplenomegaly.  MUSCULOSKELETAL: No swelling, clubbing, or edema. Range of motion full in all extremities.  NEUROLOGIC: Cranial nerves II through XII are intact. No gross focal neurological deficits. Sensation intact. Reflexes intact.  SKIN: No ulceration, lesions, rashes, or cyanosis. Skin warm and dry. Turgor intact.  PSYCHIATRIC: Mood, affect within normal limits. The patient is awake, alert and oriented  x 3. Insight, judgment intact.  ALL OTHER ROS ARE NEGATIVE          ASSESSMENT / PLAN: 67 year old pleasant white male with a history of extensive smoking history with underlying interstitial lung disease early based on CT scan findings with probable  underlying pulmonary fibrosis with history of COPD in the setting of deconditioned state and morbid obesity   Most of his pulmonary findings with probable COPD with chronic bronchitis and early interstitial lung disease is definitely related to his extensive smoking history   #1 chronic shortness of breath and dyspnea on exertion Multifactorial related to deconditioned state obesity COPD and ILD  #2 COPD Continue inhalers as prescribed Albuterol as needed No indication for steroids No signs of exacerbation at this time  #3 obesity -recommend significant weight loss -recommend changing diet  #4 deconditioned state -Recommend increased daily activity and exercise   #5 we will evaluate for sleep apnea and next office visit   Patient satisfied with his visit with follow-up in 6 months   [name] [name], M.D.  [name]  Medical Director [place] Director [facility] Cardio-Pulmonary Department

## 2018-10-10 NOTE — Patient Instructions (Signed)
Continue Inhalers as prescribed  Flu shot up to date

## 2018-11-18 ENCOUNTER — Other Ambulatory Visit: Payer: Self-pay | Admitting: Family Medicine

## 2018-11-18 DIAGNOSIS — I482 Chronic atrial fibrillation, unspecified: Secondary | ICD-10-CM

## 2018-12-24 ENCOUNTER — Other Ambulatory Visit: Payer: Self-pay | Admitting: Family Medicine

## 2018-12-24 DIAGNOSIS — I10 Essential (primary) hypertension: Secondary | ICD-10-CM

## 2018-12-31 ENCOUNTER — Inpatient Hospital Stay: Payer: Medicare Other | Attending: Oncology | Admitting: Oncology

## 2018-12-31 ENCOUNTER — Inpatient Hospital Stay: Payer: Medicare Other

## 2018-12-31 DIAGNOSIS — J449 Chronic obstructive pulmonary disease, unspecified: Secondary | ICD-10-CM | POA: Insufficient documentation

## 2018-12-31 DIAGNOSIS — Z7984 Long term (current) use of oral hypoglycemic drugs: Secondary | ICD-10-CM | POA: Insufficient documentation

## 2018-12-31 DIAGNOSIS — Z79899 Other long term (current) drug therapy: Secondary | ICD-10-CM | POA: Insufficient documentation

## 2018-12-31 DIAGNOSIS — I2510 Atherosclerotic heart disease of native coronary artery without angina pectoris: Secondary | ICD-10-CM | POA: Insufficient documentation

## 2018-12-31 DIAGNOSIS — I4891 Unspecified atrial fibrillation: Secondary | ICD-10-CM | POA: Insufficient documentation

## 2018-12-31 DIAGNOSIS — E119 Type 2 diabetes mellitus without complications: Secondary | ICD-10-CM | POA: Insufficient documentation

## 2018-12-31 DIAGNOSIS — Z7901 Long term (current) use of anticoagulants: Secondary | ICD-10-CM | POA: Insufficient documentation

## 2018-12-31 DIAGNOSIS — K219 Gastro-esophageal reflux disease without esophagitis: Secondary | ICD-10-CM | POA: Insufficient documentation

## 2018-12-31 DIAGNOSIS — Z86010 Personal history of colonic polyps: Secondary | ICD-10-CM | POA: Insufficient documentation

## 2018-12-31 DIAGNOSIS — J8410 Pulmonary fibrosis, unspecified: Secondary | ICD-10-CM | POA: Insufficient documentation

## 2018-12-31 DIAGNOSIS — E669 Obesity, unspecified: Secondary | ICD-10-CM | POA: Insufficient documentation

## 2018-12-31 DIAGNOSIS — E785 Hyperlipidemia, unspecified: Secondary | ICD-10-CM | POA: Insufficient documentation

## 2018-12-31 DIAGNOSIS — I10 Essential (primary) hypertension: Secondary | ICD-10-CM | POA: Insufficient documentation

## 2018-12-31 DIAGNOSIS — D509 Iron deficiency anemia, unspecified: Secondary | ICD-10-CM | POA: Insufficient documentation

## 2018-12-31 DIAGNOSIS — Z87891 Personal history of nicotine dependence: Secondary | ICD-10-CM | POA: Insufficient documentation

## 2018-12-31 DIAGNOSIS — R195 Other fecal abnormalities: Secondary | ICD-10-CM | POA: Insufficient documentation

## 2019-01-01 ENCOUNTER — Other Ambulatory Visit: Payer: Self-pay | Admitting: Family Medicine

## 2019-01-01 DIAGNOSIS — I10 Essential (primary) hypertension: Secondary | ICD-10-CM

## 2019-01-01 DIAGNOSIS — G2581 Restless legs syndrome: Secondary | ICD-10-CM

## 2019-01-01 DIAGNOSIS — I482 Chronic atrial fibrillation, unspecified: Secondary | ICD-10-CM

## 2019-01-01 NOTE — Telephone Encounter (Signed)
Patient has refills at the drugstore for all his medicines.  If once he gets there there are couple that needs further refills have the drugstore contact us.

## 2019-01-01 NOTE — Telephone Encounter (Signed)
Ernest [name] presented in office stating that he is out of all of his meds except for Xarelto. Patient would like to have Ramipril 10MG , Omeprazole 20MG , metoprolol 25MG , metformin 500MG , glipizide 5MG , gabapentin 600MG , furosemide 40MG ,diltiazem 180MG , canagliflozin 300MG , symbicort 160-4.5MG , and albuterol 108 90 base. Pt would like medicine sent [facility] in [place]

## 2019-01-02 ENCOUNTER — Other Ambulatory Visit: Payer: Self-pay | Admitting: Family Medicine

## 2019-01-02 MED ORDER — METOPROLOL SUCCINATE ER 25 MG PO TB24: 25.0000 mg | ORAL_TABLET | Freq: Every day | ORAL | 3 refills | Status: DC

## 2019-01-02 MED ORDER — RAMIPRIL 10 MG PO CAPS: 10.0000 mg | ORAL_CAPSULE | Freq: Every day | ORAL | 3 refills | Status: DC

## 2019-01-02 MED ORDER — OMEPRAZOLE 20 MG PO CPDR: 20.0000 mg | DELAYED_RELEASE_CAPSULE | Freq: Two times a day (BID) | ORAL | 4 refills | Status: DC

## 2019-01-02 MED ORDER — ALBUTEROL SULFATE HFA 108 (90 BASE) MCG/ACT IN AERS: 2.0000 | INHALATION_SPRAY | RESPIRATORY_TRACT | 6 refills | Status: DC | PRN

## 2019-01-02 MED ORDER — CANAGLIFLOZIN 300 MG PO TABS: 300.0000 mg | ORAL_TABLET | Freq: Every day | ORAL | 3 refills | Status: DC

## 2019-01-02 MED ORDER — RIVAROXABAN 20 MG PO TABS: ORAL_TABLET | ORAL | 1 refills | Status: DC

## 2019-01-02 MED ORDER — GABAPENTIN 600 MG PO TABS: 1200.0000 mg | ORAL_TABLET | Freq: Every day | ORAL | 4 refills | Status: DC

## 2019-01-02 NOTE — Telephone Encounter (Signed)
Spoke with pharmacy. He does need refill. Most have not been received since June  W/ 30 days w/ 3 refills. Some are do in March. Those have have not been T'd up. Pt needs new Xarelto RX because his insurance will not cover a 90 day supply

## 2019-01-02 NOTE — Telephone Encounter (Signed)
Called and spoke with pharmacy tech [place]. Medication is ready for pickup. Pharmacy will notify patient.

## 2019-01-07 ENCOUNTER — Inpatient Hospital Stay (HOSPITAL_BASED_OUTPATIENT_CLINIC_OR_DEPARTMENT_OTHER): Payer: Medicare Other | Admitting: Oncology

## 2019-01-07 ENCOUNTER — Inpatient Hospital Stay: Payer: Medicare Other

## 2019-01-07 ENCOUNTER — Encounter: Payer: Self-pay | Admitting: Oncology

## 2019-01-07 ENCOUNTER — Other Ambulatory Visit: Payer: Self-pay

## 2019-01-07 VITALS — BP 155/87 | HR 87 | Temp 98.2°F | Resp 20 | Ht 70.0 in | Wt 266.1 lb

## 2019-01-07 DIAGNOSIS — E785 Hyperlipidemia, unspecified: Secondary | ICD-10-CM

## 2019-01-07 DIAGNOSIS — E119 Type 2 diabetes mellitus without complications: Secondary | ICD-10-CM | POA: Diagnosis not present

## 2019-01-07 DIAGNOSIS — E669 Obesity, unspecified: Secondary | ICD-10-CM

## 2019-01-07 DIAGNOSIS — K219 Gastro-esophageal reflux disease without esophagitis: Secondary | ICD-10-CM | POA: Diagnosis not present

## 2019-01-07 DIAGNOSIS — Z79899 Other long term (current) drug therapy: Secondary | ICD-10-CM | POA: Diagnosis not present

## 2019-01-07 DIAGNOSIS — E538 Deficiency of other specified B group vitamins: Secondary | ICD-10-CM

## 2019-01-07 DIAGNOSIS — D509 Iron deficiency anemia, unspecified: Secondary | ICD-10-CM

## 2019-01-07 DIAGNOSIS — R195 Other fecal abnormalities: Secondary | ICD-10-CM | POA: Diagnosis not present

## 2019-01-07 DIAGNOSIS — Z7901 Long term (current) use of anticoagulants: Secondary | ICD-10-CM

## 2019-01-07 DIAGNOSIS — J449 Chronic obstructive pulmonary disease, unspecified: Secondary | ICD-10-CM | POA: Diagnosis not present

## 2019-01-07 DIAGNOSIS — I2510 Atherosclerotic heart disease of native coronary artery without angina pectoris: Secondary | ICD-10-CM

## 2019-01-07 DIAGNOSIS — Z7984 Long term (current) use of oral hypoglycemic drugs: Secondary | ICD-10-CM | POA: Diagnosis not present

## 2019-01-07 DIAGNOSIS — I10 Essential (primary) hypertension: Secondary | ICD-10-CM | POA: Diagnosis not present

## 2019-01-07 DIAGNOSIS — I4891 Unspecified atrial fibrillation: Secondary | ICD-10-CM | POA: Diagnosis not present

## 2019-01-07 DIAGNOSIS — Z87891 Personal history of nicotine dependence: Secondary | ICD-10-CM

## 2019-01-07 DIAGNOSIS — J8410 Pulmonary fibrosis, unspecified: Secondary | ICD-10-CM | POA: Diagnosis not present

## 2019-01-07 DIAGNOSIS — Z86010 Personal history of colonic polyps: Secondary | ICD-10-CM

## 2019-01-07 LAB — CBC WITH DIFFERENTIAL/PLATELET
Abs Immature Granulocytes: 0.02 10*3/uL (ref 0.00–0.07)
Basophils Absolute: 0.1 10*3/uL (ref 0.0–0.1)
Basophils Relative: 1 %
Eosinophils Absolute: 0.2 10*3/uL (ref 0.0–0.5)
Eosinophils Relative: 2 %
HCT: 43.9 % (ref 39.0–52.0)
Hemoglobin: 14.5 g/dL (ref 13.0–17.0)
Immature Granulocytes: 0 %
Lymphocytes Relative: 44 %
Lymphs Abs: 3.9 10*3/uL (ref 0.7–4.0)
MCH: 27.8 pg (ref 26.0–34.0)
MCHC: 33 g/dL (ref 30.0–36.0)
MCV: 84.1 fL (ref 80.0–100.0)
Monocytes Absolute: 0.6 10*3/uL (ref 0.1–1.0)
Monocytes Relative: 6 %
Neutro Abs: 4.1 10*3/uL (ref 1.7–7.7)
Neutrophils Relative %: 47 %
Platelets: 172 10*3/uL (ref 150–400)
RBC: 5.22 MIL/uL (ref 4.22–5.81)
RDW: 13.6 % (ref 11.5–15.5)
WBC: 8.9 10*3/uL (ref 4.0–10.5)
nRBC: 0 % (ref 0.0–0.2)

## 2019-01-07 LAB — IRON AND TIBC
Iron: 65 ug/dL (ref 45–182)
Saturation Ratios: 17 % — ABNORMAL LOW (ref 17.9–39.5)
TIBC: 381 ug/dL (ref 250–450)
UIBC: 316 ug/dL

## 2019-01-07 LAB — VITAMIN B12: Vitamin B-12: 580 pg/mL (ref 180–914)

## 2019-01-07 LAB — FERRITIN: Ferritin: 217 ng/mL (ref 24–336)

## 2019-01-07 NOTE — Progress Notes (Signed)
Patient here for follow up patient reports dizziness for the past couple months, denies dyspnea and chills.

## 2019-01-09 NOTE — Progress Notes (Signed)
Hematology/Oncology Consult note [facility]  Telephone:([phone] [phone]  Patient Care Team: Guadalupe Maple, MD as PCP - General (Family Medicine) Minna Merritts, MD as PCP - Cardiology (Cardiology)   Name of the patient: Ernest [name]  [id number]  06-05-1951   Date of visit: 01/09/19  Diagnosis- iron deficiency anemia  Chief complaint/ Reason for visit-routine follow-up of iron deficiency anemia  Heme/Onc history: patient is a 68 year old male who was recently seen by GI for iron deficiency anemia. He was also found to have some hemoptysis in March 2019 and is being followed by pulmonary. CT chest did not reveal any significant abnormalities. He is on Xarelto for his A. fib. He has had EGD and colonoscopy in 2013 and 2015. 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 EGD was normal.    Colonoscopy on 06/26/2018 showed 3 polyps in the transverse colon and one polyp in the sigmoid colon.  They were negative for malignancy.  No active bleeding noted. EGD showed salmon-colored mucosa suspicious for short segment Barrett's esophagus.  Erythematous mucosa in the antrum.  Few gastric polyps.  Normal duodenal bulb  He has been referred to [place] for iron deficiency anemia. He reports taking 1 iron pill daily. Denies any consistent use of NSAIDs. His stools have been dark since he has been on oral iron. Denies any bright red blood in stools.  Recent CBC from 02/21/2018 showed white count of 8.7, H&H of 12.9/40 with an MCV of 78 and a platelet count of 227. Patient's hemoglobin has been between 12-13 over the last 1 year.Last iron studies checked in February 2019 revealed a low normal ferritin of 35. Iron studies showed an elevated TIBC of 535 and a low iron saturation of 8%.  Repeat blood work in May 2019 showed low normal B12 levels at 282.  Celiac disease panel  was negative.  Stool antigen for H. pylori was negative.  Urinalysis did not reveal any hematuria.  Folate level was normal.  Iron saturation was low normal at 14%  Patient received 2 doses of Feraheme in May 2019  Patient underwent EGD and colonoscopy in August 2019 which did not reveal any evidence of bleeding or malignancy.  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  Denies any dark melanotic stools.  His energy levels are good he has mild chronic fatigue which is essentially stable at his baseline  ECOG PS- 1 Pain scale- 0   Review of systems- Review of Systems  Constitutional: Positive for malaise/fatigue. Negative for chills, fever and weight loss.  HENT: Negative for congestion, ear discharge and nosebleeds.   Eyes: Negative for blurred vision.  Respiratory: Negative for cough, hemoptysis, sputum production, shortness of breath and wheezing.   Cardiovascular: Negative for chest pain, palpitations, orthopnea and claudication.  Gastrointestinal: Negative for abdominal pain, blood in stool, constipation, diarrhea, heartburn, melena, nausea and vomiting.  Genitourinary: Negative for dysuria, flank pain, frequency, hematuria and urgency.  Musculoskeletal: Negative for back pain, joint pain and myalgias.  Skin: Negative for rash.  Neurological: Negative for dizziness, tingling, focal weakness, seizures, weakness and headaches.  Endo/Heme/Allergies: Does not bruise/bleed easily.  Psychiatric/Behavioral: Negative for depression and suicidal ideas. The patient  does not have insomnia.        No Known Allergies   Past Medical History:  Diagnosis Date  . Asthma   . COPD (chronic obstructive pulmonary disease) ([place])   . Diabetes mellitus    Type II  .  Early Pulmonary fibrosis ([place])   . GERD (gastroesophageal reflux disease)   . History of echocardiogram    a. 02/2017 Echo: EF 60-65%, no rwma, mild MR, mod dil LA. Nl RV fxn. PASP 62mmHg.  Marland Kitchen Hyperlipidemia   . Hypertension   . Morbid obesity ([place])   . Non-obstructive CAD (coronary artery disease)    a. 2011 Cath: nonobs dzs; b. 07/2014 Cath: LM 30, LAD nl, LCX nl, RCA 20p, 78m.  Marland Kitchen Permanent atrial fibrillation    a. CHA2DS2VASc = 4-->xarelto.     Past Surgical History:  Procedure Laterality Date  . ANKLE SURGERY    . CARDIAC CATHETERIZATION  05/2010   [facility]  . CARDIAC CATHETERIZATION     [facility]  . CARDIAC CATHETERIZATION     [facility]  . CARDIAC CATHETERIZATION  07/2014   [facility]  . COLONOSCOPY    . COLONOSCOPY WITH PROPOFOL N/A 06/26/2018   Procedure: COLONOSCOPY WITH PROPOFOL;  Surgeon: Virgel Manifold, MD;  Location: [facility] ENDOSCOPY;  Service: Endoscopy;  Laterality: N/A;  . ENTEROSCOPY N/A 08/05/2018   Procedure: ENTEROSCOPY;  Surgeon: Virgel Manifold, MD;  Location: [facility] ENDOSCOPY;  Service: Endoscopy;  Laterality: N/A;  . ESOPHAGOGASTRODUODENOSCOPY (EGD) WITH PROPOFOL N/A 06/26/2018   Procedure: ESOPHAGOGASTRODUODENOSCOPY (EGD) WITH PROPOFOL;  Surgeon: Virgel Manifold, MD;  Location: [facility] ENDOSCOPY;  Service: Endoscopy;  Laterality: N/A;  . TESTICLE SURGERY  70's  . TUMOR EXCISION     Neck and finger; benign  . WRIST SURGERY      Social History   Socioeconomic History  . Marital status: Widowed    Spouse name: Not on file  . Number of children: Not on file  . Years of education: Not on file  . Highest education level: Not on file  Occupational History  . Occupation: Full time  Social Needs  . Financial resource strain: Not hard at all  . Food insecurity:    Worry: Never true    Inability: Never true  . Transportation needs:    Medical: No    Non-medical: No  Tobacco Use  . Smoking status: Former Smoker    Packs/day: 4.00    Years: 28.00    Pack years:  112.00    Types: Cigarettes    Last attempt to quit: 08/25/1993    Years since quitting: 25.3  . Smokeless tobacco: Never Used  Substance and Sexual Activity  . Alcohol use: Yes    Comment: rarely  . Drug use: No  . Sexual activity: Not on file  Lifestyle  . Physical activity:    Days per week: 0 days    Minutes per session: 0 min  . Stress: Not at all  Relationships  . Social connections:    Talks on phone: More than three times a week    Gets together: More than three times a week    Attends religious service: Never    Active member of club or organization: No    Attends meetings of clubs or organizations: Never    Relationship status: Widowed  . Intimate partner violence:    Fear of current or ex partner: No    Emotionally abused: No    Physically abused: No    Forced  sexual activity: No  Other Topics Concern  . Not on file  Social History Narrative   No regular exercise.    Family History  Problem Relation Age of Onset  . Hypertension Mother   . Hyperlipidemia Mother   . Diabetes Mother   . Heart disease Mother        CABG  . Pneumonia Father        rare type  . Diabetes Other   . Depression Other   . Coronary artery disease Other   . Alcohol abuse Other   . Hypertension Other   . Hyperlipidemia Other   . Cancer Brother   . Kidney Stones Brother      Current Outpatient Medications:  .  albuterol (PROVENTIL HFA;VENTOLIN HFA) 108 (90 Base) MCG/ACT inhaler, Inhale 2 puffs into the lungs every 4 (four) hours as needed for wheezing or shortness of breath., Disp: 1 Inhaler, Rfl: 6 .  Albuterol Sulfate 108 (90 Base) MCG/ACT AEPB, Inhale 2 Act into the lungs every 4 (four) hours as needed., Disp: 1 each, Rfl: 12 .  budesonide-formoterol (SYMBICORT) 160-4.5 MCG/ACT inhaler, Inhale 2 puffs into the lungs 2 (two) times daily., Disp: 1 Inhaler, Rfl: 12 .  canagliflozin (INVOKANA) 300 MG TABS tablet, Take 1 tablet (300 mg total) by mouth daily before breakfast., Disp:  30 tablet, Rfl: 3 .  diltiazem (CARDIZEM CD) 180 MG 24 hr capsule, Take 1 capsule (180 mg total) by mouth daily., Disp: 90 capsule, Rfl: 3 .  doxycycline (VIBRA-TABS) 100 MG tablet, Take 1 tablet (100 mg total) by mouth 2 (two) times daily., Disp: 20 tablet, Rfl: 0 .  furosemide (LASIX) 40 MG tablet, Take 0.5 tablets (20 mg total) by mouth 2 (two) times daily., Disp: 180 tablet, Rfl: 4 .  gabapentin (NEURONTIN) 600 MG tablet, Take 2 tablets (1,200 mg total) by mouth at bedtime., Disp: 180 tablet, Rfl: 4 .  glipiZIDE (GLUCOTROL) 5 MG tablet, TAKE 1 TABLET BY MOUTH  DAILY BEFORE BREAKFAST, Disp: 90 tablet, Rfl: 4 .  isosorbide mononitrate (IMDUR) 60 MG 24 hr tablet, TAKE 1 TABLET BY MOUTH  DAILY, Disp: 90 tablet, Rfl: 4 .  metFORMIN (GLUCOPHAGE) 500 MG tablet, Take 2 tablets (1,000 mg total) by mouth 2 (two) times daily., Disp: 360 tablet, Rfl: 4 .  metoprolol succinate (TOPROL-XL) 25 MG 24 hr tablet, Take 1 tablet (25 mg total) by mouth daily., Disp: 30 tablet, Rfl: 3 .  omeprazole (PRILOSEC) 20 MG capsule, Take 1 capsule (20 mg total) by mouth 2 (two) times daily before a meal., Disp: 30 capsule, Rfl: 4 .  ramipril (ALTACE) 10 MG capsule, Take 1 capsule (10 mg total) by mouth daily., Disp: 30 capsule, Rfl: 3 .  rivaroxaban (XARELTO) 20 MG TABS tablet, TAKE 1 TABLET(20 MG) BY MOUTH DAILY WITH SUPPER, Disp: 30 tablet, Rfl: 1 .  triamcinolone ointment (KENALOG) 0.5 %, Apply 1 application topically 2 (two) times daily., Disp: 30 g, Rfl: 0  Physical exam:  Vitals:   01/07/19 1507 01/07/19 1516  BP:  (!) 155/87  Pulse:  87  Resp: 20   Temp:  98.2 F (36.8 C)  TempSrc:  Tympanic  Weight: 266 lb 1.6 oz (120.7 kg)   Height: 5\' 10"  (1.778 m)    Physical Exam HENT:     Head: Normocephalic and atraumatic.  Eyes:     Pupils: Pupils are equal, round, and reactive to light.  Neck:     Musculoskeletal: Normal range of motion.  Cardiovascular:  Rate and Rhythm: Normal rate and regular rhythm.      Heart sounds: Normal heart sounds.  Pulmonary:     Effort: Pulmonary effort is normal.     Breath sounds: Normal breath sounds.  Abdominal:     General: Bowel sounds are normal.     Palpations: Abdomen is soft.  Skin:    General: Skin is warm and dry.  Neurological:     Mental Status: He is alert and oriented to person, place, and time.      CMP Latest Ref Rng & Units 02/21/2018  Glucose 65 - 99 mg/dL 168(H)  BUN 8 - 27 mg/dL 17  Creatinine 0.76 - 1.27 mg/dL 0.96  Sodium 134 - 144 mmol/L 141  Potassium 3.5 - 5.2 mmol/L 4.1  Chloride 96 - 106 mmol/L 100  CO2 20 - 29 mmol/L 25  Calcium 8.6 - 10.2 mg/dL 9.7  Total Protein 6.0 - 8.5 g/dL -  Total Bilirubin 0.0 - 1.2 mg/dL -  Alkaline Phos 39 - 117 IU/L -  AST 0 - 40 IU/L -  ALT 0 - 44 IU/L -   CBC Latest Ref Rng & Units 01/07/2019  WBC 4.0 - 10.5 K/uL 8.9  Hemoglobin 13.0 - 17.0 g/dL 14.5  Hematocrit 39.0 - 52.0 % 43.9  Platelets 150 - 400 K/uL 172     Assessment and plan- Patient is a 68 y.o. male with iron deficiency anemia likely secondary to small bowel bleeding AVMs  Patient has not required any IV iron for the last 7 months.  His hemoglobin is stable between 13-14.  His iron studies are normal.  He does not require IV iron at this time.  We will repeat his CBC ferritin and iron studies in 4 months in 8 months and I will see him back in 8 months   Visit Diagnosis 1. Iron deficiency anemia, unspecified iron deficiency anemia type      Dr. [name], MD, MPH [facility] [phone] 01/09/2019 1:37 PM

## 2019-01-27 ENCOUNTER — Encounter: Payer: Self-pay | Admitting: Family Medicine

## 2019-01-27 ENCOUNTER — Ambulatory Visit (INDEPENDENT_AMBULATORY_CARE_PROVIDER_SITE_OTHER): Payer: Medicare Other | Admitting: Family Medicine

## 2019-01-27 VITALS — BP 123/83 | HR 88 | Temp 98.1°F | Ht 70.0 in | Wt 264.4 lb

## 2019-01-27 DIAGNOSIS — J439 Emphysema, unspecified: Secondary | ICD-10-CM | POA: Diagnosis not present

## 2019-01-27 DIAGNOSIS — E782 Mixed hyperlipidemia: Secondary | ICD-10-CM

## 2019-01-27 DIAGNOSIS — N183 Chronic kidney disease, stage 3 (moderate): Secondary | ICD-10-CM

## 2019-01-27 DIAGNOSIS — I25111 Atherosclerotic heart disease of native coronary artery with angina pectoris with documented spasm: Secondary | ICD-10-CM | POA: Diagnosis not present

## 2019-01-27 DIAGNOSIS — I129 Hypertensive chronic kidney disease with stage 1 through stage 4 chronic kidney disease, or unspecified chronic kidney disease: Secondary | ICD-10-CM | POA: Diagnosis not present

## 2019-01-27 DIAGNOSIS — I4821 Permanent atrial fibrillation: Secondary | ICD-10-CM | POA: Diagnosis not present

## 2019-01-27 DIAGNOSIS — I10 Essential (primary) hypertension: Secondary | ICD-10-CM

## 2019-01-27 DIAGNOSIS — E1122 Type 2 diabetes mellitus with diabetic chronic kidney disease: Secondary | ICD-10-CM

## 2019-01-27 LAB — COAGUCHEK XS/INR WAIVED
INR: 1 (ref 0.9–1.1)
Prothrombin Time: 12.2 s

## 2019-01-27 MED ORDER — BUDESONIDE-FORMOTEROL FUMARATE 160-4.5 MCG/ACT IN AERO: 2.0000 | INHALATION_SPRAY | Freq: Two times a day (BID) | RESPIRATORY_TRACT | 12 refills | Status: DC

## 2019-01-27 MED ORDER — ISOSORBIDE MONONITRATE ER 60 MG PO TB24: 60.0000 mg | ORAL_TABLET | Freq: Every day | ORAL | 0 refills | Status: DC

## 2019-01-27 MED ORDER — ATORVASTATIN CALCIUM 40 MG PO TABS: 40.0000 mg | ORAL_TABLET | Freq: Every day | ORAL | 0 refills | Status: DC

## 2019-01-27 NOTE — Progress Notes (Signed)
BP 123/83 (BP Location: Right Arm, Patient Position: Sitting, Cuff Size: Normal)   Pulse 88   Temp 98.1 F (36.7 C) (Oral)   Ht _0  (1.778 m)   Wt 264 lb 6.4 oz (119.9 kg)   SpO2 97%   BMI 37.94 kg/m    Subjective:    Patient ID: Ernest [name], male    DOB: December 17, 1951, 67 y.o.   MRN: [id number]  HPI: Ernest [name] is a 68 y.o. male  Chief Complaint  Patient presents with  . Coagulation Disorder   Here today because he's overdue for f/u chronic conditions. 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 No bleeding or bruising issues noted. Rate well controlled with metoprolol and diltiazem.   HTN - BPs stable and WNL when checked. Taking metoprolol, lasix, diltiazem. Denies CP, palpitations, SOB.   DM - currently on glipizide, metformin and invokana. Does not watch diet carefully or exercise. Does not check home sugars. Denies low blood sugar spells.   Hyperlipidemia/CAD - taking lipitor without issue. No myalgias, claudication.   Emphysema - Using symbicort and albuterol without recent flares. Former smoker.   Relevant past medical, surgical, family and social history reviewed and updated as indicated. Interim medical history since our last visit reviewed. Allergies and medications reviewed and updated.  Review of Systems  Per HPI unless specifically indicated above     Objective:    BP 123/83 (BP Location: Right Arm, Patient Position: Sitting, Cuff Size: Normal)   Pulse 88   Temp 98.1 F (36.7 C) (Oral)   Ht _1  (1.778 m)   Wt 264 lb 6.4 oz (119.9 kg)   SpO2 97%   BMI 37.94 kg/m   Wt Readings from Last 3 Encounters:  01/27/19 264 lb 6.4 oz (119.9 kg)  01/07/19 266 lb 1.6 oz (120.7 kg)  10/10/18 266 lb 9.6 oz (120.9 kg)    Physical Exam Vitals signs and nursing note reviewed.  Constitutional:      Appearance: Normal appearance.  HENT:     Head: Atraumatic.  Eyes:     Extraocular  Movements: Extraocular movements intact.     Conjunctiva/sclera: Conjunctivae normal.  Neck:     Musculoskeletal: Normal range of motion and neck supple.  Cardiovascular:     Rate and Rhythm: Normal rate.     Pulses: Normal pulses.  Pulmonary:     Effort: Pulmonary effort is normal.     Breath sounds: Normal breath sounds.  Musculoskeletal: Normal range of motion.  Skin:    General: Skin is warm and dry.  Neurological:     General: No focal deficit present.     Mental Status: He is oriented to person, place, and time.  Psychiatric:        Mood and Affect: Mood normal.        Thought Content: Thought content normal.        Judgment: Judgment normal.     Results for orders placed or performed in visit on 01/27/19  CoaguChek XS/INR Waived  Result Value Ref Range   INR 1.0 0.9 - 1.1   Prothrombin Time 12.2 sec  Lipid Panel w/o Chol/HDL Ratio  Result Value Ref Range   Cholesterol, Total 209 (H) 100 - 199 mg/dL   Triglycerides 340 (H) 0 - 149 mg/dL   HDL 30 (L) >39 mg/dL   VLDL Cholesterol Cal 68 (H) 5 - 40 mg/dL   LDL Calculated  111 (H) 0 - 99 mg/dL  Comprehensive metabolic panel  Result Value Ref Range   Glucose 199 (H) 65 - 99 mg/dL   BUN 14 8 - 27 mg/dL   Creatinine, Ser 1.17 0.76 - 1.27 mg/dL   GFR calc non Af Amer 64 >59 mL/min/1.73   GFR calc Af Amer 74 >59 mL/min/1.73   BUN/Creatinine Ratio 12 10 - 24   Sodium 142 134 - 144 mmol/L   Potassium 3.8 3.5 - 5.2 mmol/L   Chloride 101 96 - 106 mmol/L   CO2 23 20 - 29 mmol/L   Calcium 9.5 8.6 - 10.2 mg/dL   Total Protein 6.7 6.0 - 8.5 g/dL   Albumin 4.4 3.8 - 4.8 g/dL   Globulin, Total 2.3 1.5 - 4.5 g/dL   Albumin/Globulin Ratio 1.9 1.2 - 2.2   Bilirubin Total 0.9 0.0 - 1.2 mg/dL   Alkaline Phosphatase 82 39 - 117 IU/L   AST 32 0 - 40 IU/L   ALT 39 0 - 44 IU/L  HgB A1c  Result Value Ref Range   Hgb A1c MFr Bld 8.8 (H) 4.8 - 5.6 %   Est. average glucose Bld gHb Est-mCnc 206 mg/dL      Assessment & Plan:    Problem List Items Addressed This Visit      Cardiovascular and Mediastinum   CAD, NATIVE VESSEL    Stable with no recent angina. Continue current regimen      Relevant Medications   atorvastatin (LIPITOR) 40 MG tablet   isosorbide mononitrate (IMDUR) 60 MG 24 hr tablet   Atrial fibrillation (HCC) - Primary    INR done today per pt request for DOT physical. Rate and sxs under good control. Continue current regimen      Relevant Medications   atorvastatin (LIPITOR) 40 MG tablet   isosorbide mononitrate (IMDUR) 60 MG 24 hr tablet   Other Relevant Orders   CoaguChek XS/INR Waived (Completed)   Essential hypertension    Stable and WNL, continue current regimen      Relevant Medications   atorvastatin (LIPITOR) 40 MG tablet   isosorbide mononitrate (IMDUR) 60 MG 24 hr tablet   Type 2 DM with CKD stage 3 and hypertension (HCC)    Recheck A1C, adjust as needed. Work on diet and exercise improvements      Relevant Medications   atorvastatin (LIPITOR) 40 MG tablet   isosorbide mononitrate (IMDUR) 60 MG 24 hr tablet   Other Relevant Orders   Comprehensive metabolic panel (Completed)   HgB A1c (Completed)     Respiratory   COPD (chronic obstructive pulmonary disease) with emphysema (HCC)    Continue inhaler regimen      Relevant Medications   budesonide-formoterol (SYMBICORT) 160-4.5 MCG/ACT inhaler     Other   Hyperlipidemia    Recheck lipids, adjust as needed. Lifestyle modifications reviewed      Relevant Medications   atorvastatin (LIPITOR) 40 MG tablet   isosorbide mononitrate (IMDUR) 60 MG 24 hr tablet   Other Relevant Orders   Lipid Panel w/o Chol/HDL Ratio (Completed)       Follow up plan: Return in about 3 months (around 04/27/2019) for CPE with MAC.

## 2019-01-28 LAB — COMPREHENSIVE METABOLIC PANEL
ALT: 39 IU/L (ref 0–44)
AST: 32 IU/L (ref 0–40)
Albumin/Globulin Ratio: 1.9 (ref 1.2–2.2)
Albumin: 4.4 g/dL (ref 3.8–4.8)
Alkaline Phosphatase: 82 IU/L (ref 39–117)
BUN/Creatinine Ratio: 12 (ref 10–24)
BUN: 14 mg/dL (ref 8–27)
Bilirubin Total: 0.9 mg/dL (ref 0.0–1.2)
CO2: 23 mmol/L (ref 20–29)
Calcium: 9.5 mg/dL (ref 8.6–10.2)
Chloride: 101 mmol/L (ref 96–106)
Creatinine, Ser: 1.17 mg/dL (ref 0.76–1.27)
GFR calc Af Amer: 74 mL/min/{1.73_m2} (ref >59)
GFR calc non Af Amer: 64 mL/min/{1.73_m2} (ref >59)
Globulin, Total: 2.3 g/dL (ref 1.5–4.5)
Glucose: 199 mg/dL — ABNORMAL HIGH (ref 65–99)
Potassium: 3.8 mmol/L (ref 3.5–5.2)
Sodium: 142 mmol/L (ref 134–144)
Total Protein: 6.7 g/dL (ref 6.0–8.5)

## 2019-01-28 LAB — HEMOGLOBIN A1C
Est. average glucose Bld gHb Est-mCnc: 206 mg/dL
Hgb A1c MFr Bld: 8.8 % — ABNORMAL HIGH (ref 4.8–5.6)

## 2019-01-28 LAB — LIPID PANEL W/O CHOL/HDL RATIO
Cholesterol, Total: 209 mg/dL — ABNORMAL HIGH (ref 100–199)
HDL: 30 mg/dL — ABNORMAL LOW (ref >39)
LDL Calculated: 111 mg/dL — ABNORMAL HIGH (ref 0–99)
Triglycerides: 340 mg/dL — ABNORMAL HIGH (ref 0–149)
VLDL Cholesterol Cal: 68 mg/dL — ABNORMAL HIGH (ref 5–40)

## 2019-01-30 NOTE — Assessment & Plan Note (Signed)
Recheck A1C, adjust as needed. Work on diet and exercise improvements

## 2019-01-30 NOTE — Assessment & Plan Note (Signed)
Stable with no recent angina. Continue current regimen

## 2019-01-30 NOTE — Assessment & Plan Note (Signed)
Stable and WNL, continue current regimen 

## 2019-01-30 NOTE — Assessment & Plan Note (Signed)
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 Rate and sxs under good control. Continue current regimen

## 2019-01-30 NOTE — Assessment & Plan Note (Signed)
Recheck lipids, adjust as needed. Lifestyle modifications reviewed 

## 2019-01-30 NOTE — Assessment & Plan Note (Signed)
Continue inhaler regimen

## 2019-02-03 ENCOUNTER — Ambulatory Visit (INDEPENDENT_AMBULATORY_CARE_PROVIDER_SITE_OTHER): Payer: Medicare Other

## 2019-02-03 VITALS — BP 138/78 | HR 58 | Temp 98.9°F | Resp 17 | Ht 70.0 in | Wt 271.0 lb

## 2019-02-03 DIAGNOSIS — Z0000 Encounter for general adult medical examination without abnormal findings: Secondary | ICD-10-CM

## 2019-02-03 DIAGNOSIS — Z23 Encounter for immunization: Secondary | ICD-10-CM

## 2019-02-03 NOTE — Patient Instructions (Addendum)
Ernest [name] , Thank you for taking time to come for your Medicare Wellness Visit. I appreciate your ongoing commitment to your health goals. Please review the following plan we discussed and let me know if I can assist you in the future.   Screening recommendations/referrals: Colonoscopy: completed 06/26/2018 Recommended yearly ophthalmology/optometry visit for glaucoma screening and checkup Recommended yearly dental visit for hygiene and checkup  Vaccinations: Influenza vaccine: up to date  Pneumococcal vaccine: pneumovax 23 done  Tdap vaccine: up to date Shingles vaccine: up to date     Advanced directives: Please bring a copy of your health care power of attorney and living will to the office at your convenience.  Conditions/risks identified: recommend reducing sugar intake.   Next appointment: Follow up in one year for your annual wellness exam.   Preventive Care 65 Years and Older, Male Preventive care refers to lifestyle choices and visits with your health care provider that can promote health and wellness. What does preventive care include?  A yearly physical exam. This is also called an annual well check.  Dental exams once or twice a year.  Routine eye exams. Ask your health care provider how often you should have your eyes checked.  Personal lifestyle choices, including:  Daily care of your teeth and gums.  Regular physical activity.  Eating a healthy diet.  Avoiding tobacco and drug use.  Limiting alcohol use.  Practicing safe sex.  Taking low doses of aspirin every day.  Taking vitamin and mineral supplements as recommended by your health care provider. What happens during an annual well check? The services and screenings done by your health care provider during your annual well check will depend on your age, overall health, lifestyle risk factors, and family history of disease. Counseling  Your health care provider may ask you questions about  your:  Alcohol use.  Tobacco use.  Drug use.  Emotional well-being.  Home and relationship well-being.  Sexual activity.  Eating habits.  History of falls.  Memory and ability to understand (cognition).  Work and work Statistician. Screening  You may have the following tests or measurements:  Height, weight, and BMI.  Blood pressure.  Lipid and cholesterol levels. These may be checked every 5 years, or more frequently if you are over 78 years old.  Skin check.  Lung cancer screening. You may have this screening every year starting at age 58 if you have a 30-pack-year history of smoking and currently smoke or have quit within the past 15 years.  Fecal occult blood test (FOBT) of the stool. You may have this test every year starting at age 59.  Flexible sigmoidoscopy or colonoscopy. You may have a sigmoidoscopy every 5 years or a colonoscopy every 10 years starting at age 32.  Prostate cancer screening. Recommendations will vary depending on your family history and other risks.  Hepatitis C blood test.  Hepatitis B blood test.  Sexually transmitted disease (STD) testing.  Diabetes screening. This is done by checking your blood sugar (glucose) after you have not eaten for a while (fasting). You may have this done every 1-3 years.  Abdominal aortic aneurysm (AAA) screening. You may need this if you are a current or former smoker.  Osteoporosis. You may be screened starting at age 31 if you are at high risk. Talk with your health care provider about your test results, treatment options, and if necessary, the need for more tests. Vaccines  Your health care provider may recommend certain vaccines, such  as:  Influenza vaccine. This is recommended every year.  Tetanus, diphtheria, and acellular pertussis (Tdap, Td) vaccine. You may need a Td booster every 10 years.  Zoster vaccine. You may need this after age 58.  Pneumococcal 13-valent conjugate (PCV13) vaccine.  One dose is recommended after age 48.  Pneumococcal polysaccharide (PPSV23) vaccine. One dose is recommended after age 52. Talk to your health care provider about which screenings and vaccines you need and how often you need them. This information is not intended to replace advice given to you by your health care provider. Make sure you discuss any questions you have with your health care provider. Document Released: 12/31/2015 Document Revised: 08/23/2016 Document Reviewed: 10/05/2015 Elsevier Interactive Patient Education  2017 [place] Prevention in the Home Falls can cause injuries. They can happen to people of all ages. There are many things you can do to make your home safe and to help prevent falls. What can I do on the outside of my home?  Regularly fix the edges of walkways and driveways and fix any cracks.  Remove anything that might make you trip as you walk through a door, such as a raised step or threshold.  Trim any bushes or trees on the path to your home.  Use bright outdoor lighting.  Clear any walking paths of anything that might make someone trip, such as rocks or tools.  Regularly check to see if handrails are loose or broken. Make sure that both sides of any steps have handrails.  Any raised decks and porches should have guardrails on the edges.  Have any leaves, snow, or ice cleared regularly.  Use sand or salt on walking paths during winter.  Clean up any spills in your garage right away. This includes oil or grease spills. What can I do in the bathroom?  Use night lights.  Install grab bars by the toilet and in the tub and shower. Do not use towel bars as grab bars.  Use non-skid mats or decals in the tub or shower.  If you need to sit down in the shower, use a plastic, non-slip stool.  Keep the floor dry. Clean up any water that spills on the floor as soon as it happens.  Remove soap buildup in the tub or shower regularly.  Attach bath  mats securely with double-sided non-slip rug tape.  Do not have throw rugs and other things on the floor that can make you trip. What can I do in the bedroom?  Use night lights.  Make sure that you have a light by your bed that is easy to reach.  Do not use any sheets or blankets that are too big for your bed. They should not hang down onto the floor.  Have a firm chair that has side arms. You can use this for support while you get dressed.  Do not have throw rugs and other things on the floor that can make you trip. What can I do in the kitchen?  Clean up any spills right away.  Avoid walking on wet floors.  Keep items that you use a lot in easy-to-reach places.  If you need to reach something above you, use a strong step stool that has a grab bar.  Keep electrical cords out of the way.  Do not use floor polish or wax that makes floors slippery. If you must use wax, use non-skid floor wax.  Do not have throw rugs and other things on the  floor that can make you trip. What can I do with my stairs?  Do not leave any items on the stairs.  Make sure that there are handrails on both sides of the stairs and use them. Fix handrails that are broken or loose. Make sure that handrails are as long as the stairways.  Check any carpeting to make sure that it is firmly attached to the stairs. Fix any carpet that is loose or worn.  Avoid having throw rugs at the top or bottom of the stairs. If you do have throw rugs, attach them to the floor with carpet tape.  Make sure that you have a light switch at the top of the stairs and the bottom of the stairs. If you do not have them, ask someone to add them for you. What else can I do to help prevent falls?  Wear shoes that:  Do not have high heels.  Have rubber bottoms.  Are comfortable and fit you well.  Are closed at the toe. Do not wear sandals.  If you use a stepladder:  Make sure that it is fully opened. Do not climb a closed  stepladder.  Make sure that both sides of the stepladder are locked into place.  Ask someone to hold it for you, if possible.  Clearly mark and make sure that you can see:  Any grab bars or handrails.  First and last steps.  Where the edge of each step is.  Use tools that help you move around (mobility aids) if they are needed. These include:  Canes.  Walkers.  Scooters.  Crutches.  Turn on the lights when you go into a dark area. Replace any light bulbs as soon as they burn out.  Set up your furniture so you have a clear path. Avoid moving your furniture around.  If any of your floors are uneven, fix them.  If there are any pets around you, be aware of where they are.  Review your medicines with your doctor. Some medicines can make you feel dizzy. This can increase your chance of falling. Ask your doctor what other things that you can do to help prevent falls. This information is not intended to replace advice given to you by your health care provider. Make sure you discuss any questions you have with your health care provider. Document Released: 09/30/2009 Document Revised: 05/11/2016 Document Reviewed: 01/08/2015 Elsevier Interactive Patient Education  2017 [place].   Diabetes Mellitus and Nutrition, Adult When you have diabetes (diabetes mellitus), it is very important to have healthy eating habits because your blood sugar (glucose) levels are greatly affected by what you eat and drink. Eating healthy foods in the appropriate amounts, at about the same times every day, can help you:  Control your blood glucose.  Lower your risk of heart disease.  Improve your blood pressure.  Reach or maintain a healthy weight. Every person with diabetes is different, and each person has different needs for a meal plan. Your health care provider may recommend that you work with a diet and nutrition specialist (dietitian) to make a meal plan that is best for you. Your meal  plan may vary depending on factors such as:  The calories you need.  The medicines you take.  Your weight.  Your blood glucose, blood pressure, and cholesterol levels.  Your activity level.  Other health conditions you have, such as heart or kidney disease. How do carbohydrates affect me? Carbohydrates, also called carbs, affect your blood  glucose level more than any other type of food. Eating carbs naturally raises the amount of glucose in your blood. Carb counting is a method for keeping track of how many carbs you eat. Counting carbs is important to keep your blood glucose at a healthy level, especially if you use insulin or take certain oral diabetes medicines. It is important to know how many carbs you can safely have in each meal. This is different for every person. Your dietitian can help you calculate how many carbs you should have at each meal and for each snack. Foods that contain carbs include:  Bread, cereal, rice, pasta, and crackers.  Potatoes and corn.  Peas, beans, and lentils.  Milk and yogurt.  Fruit and juice.  Desserts, such as cakes, cookies, ice cream, and candy. How does alcohol affect me? Alcohol can cause a sudden decrease in blood glucose (hypoglycemia), especially if you use insulin or take certain oral diabetes medicines. Hypoglycemia can be a life-threatening condition. Symptoms of hypoglycemia (sleepiness, dizziness, and confusion) are similar to symptoms of having too much alcohol. If your health care provider says that alcohol is safe for you, follow these guidelines:  Limit alcohol intake to no more than 1 drink per day for nonpregnant women and 2 drinks per day for men. One drink equals 12 oz of beer, 5 oz of wine, or 1 oz of hard liquor.  Do not drink on an empty stomach.  Keep yourself hydrated with water, diet soda, or unsweetened iced tea.  Keep in mind that regular soda, juice, and other mixers may contain a lot of sugar and must be  counted as carbs. What are tips for following this plan?  Reading food labels  Start by checking the serving size on the "Nutrition Facts" label of packaged foods and drinks. The amount of calories, carbs, fats, and other nutrients listed on the label is based on one serving of the item. Many items contain more than one serving per package.  Check the total grams (g) of carbs in one serving. You can calculate the number of servings of carbs in one serving by dividing the total carbs by 15. For example, if a food has 30 g of total carbs, it would be equal to 2 servings of carbs.  Check the number of grams (g) of saturated and trans fats in one serving. Choose foods that have low or no amount of these fats.  Check the number of milligrams (mg) of salt (sodium) in one serving. Most people should limit total sodium intake to less than 2,300 mg per day.  Always check the nutrition information of foods labeled as "low-fat" or "nonfat". These foods may be higher in added sugar or refined carbs and should be avoided.  Talk to your dietitian to identify your daily goals for nutrients listed on the label. Shopping  Avoid buying canned, premade, or processed foods. These foods tend to be high in fat, sodium, and added sugar.  Shop around the outside edge of the grocery store. This includes fresh fruits and vegetables, bulk grains, fresh meats, and fresh dairy. Cooking  Use low-heat cooking methods, such as baking, instead of high-heat cooking methods like deep frying.  Cook using healthy oils, such as olive, canola, or sunflower oil.  Avoid cooking with butter, cream, or high-fat meats. Meal planning  Eat meals and snacks regularly, preferably at the same times every day. Avoid going long periods of time without eating.  Eat foods high in fiber, such  as fresh fruits, vegetables, beans, and whole grains. Talk to your dietitian about how many servings of carbs you can eat at each meal.  Eat 4-6  ounces (oz) of lean protein each day, such as lean meat, chicken, fish, eggs, or tofu. One oz of lean protein is equal to: ? 1 oz of meat, chicken, or fish. ? 1 egg. ?  cup of tofu.  Eat some foods each day that contain healthy fats, such as avocado, nuts, seeds, and fish. Lifestyle  Check your blood glucose regularly.  Exercise regularly as told by your health care provider. This may include: ? 150 minutes of moderate-intensity or vigorous-intensity exercise each week. This could be brisk walking, biking, or water aerobics. ? Stretching and doing strength exercises, such as yoga or weightlifting, at least 2 times a week.  Take medicines as told by your health care provider.  Do not use any products that contain nicotine or tobacco, such as cigarettes and e-cigarettes. If you need help quitting, ask your health care provider.  Work with a Social worker or diabetes educator to identify strategies to manage stress and any emotional and social challenges. Questions to ask a health care provider  Do I need to meet with a diabetes educator?  Do I need to meet with a dietitian?  What number can I call if I have questions?  When are the best times to check my blood glucose? Where to find more information:  American Diabetes Association: diabetes.org  Academy of Nutrition and Dietetics: www.eatright.[employer] of Diabetes and Digestive and Kidney Diseases (NIH): [employer] Summary  A healthy meal plan will help you control your blood glucose and maintain a healthy lifestyle.  Working with a diet and nutrition specialist (dietitian) can help you make a meal plan that is best for you.  Keep in mind that carbohydrates (carbs) and alcohol have immediate effects on your blood glucose levels. It is important to count carbs and to use alcohol carefully. This information is not intended to replace advice given to you by your health care provider. Make sure you discuss any  questions you have with your health care provider. Document Released: 08/31/2005 Document Revised: 07/04/2017 Document Reviewed: 01/08/2017 Elsevier Interactive Patient Education  2019 [place].    Carbohydrate Counting for Diabetes Mellitus, Adult  Carbohydrate counting is a method of keeping track of how many carbohydrates you eat. Eating carbohydrates naturally increases the amount of sugar (glucose) in the blood. Counting how many carbohydrates you eat helps keep your blood glucose within normal limits, which helps you manage your diabetes (diabetes mellitus). It is important to know how many carbohydrates you can safely have in each meal. This is different for every person. A diet and nutrition specialist (registered dietitian) can help you make a meal plan and calculate how many carbohydrates you should have at each meal and snack. Carbohydrates are found in the following foods:  Grains, such as breads and cereals.  Dried beans and soy products.  Starchy vegetables, such as potatoes, peas, and corn.  Fruit and fruit juices.  Milk and yogurt.  Sweets and snack foods, such as cake, cookies, candy, chips, and soft drinks. How do I count carbohydrates? There are two ways to count carbohydrates in food. You can use either of the methods or a combination of both. Reading "Nutrition Facts" on packaged food The "Nutrition Facts" list is included on the labels of almost all packaged foods and beverages in the [place] It includes:  The  serving size.  Information about nutrients in each serving, including the grams (g) of carbohydrate per serving. To use the "Nutrition Facts":  Decide how many servings you will have.  Multiply the number of servings by the number of carbohydrates per serving.  The resulting number is the total amount of carbohydrates that you will be having. Learning standard serving sizes of other foods When you eat carbohydrate foods that are not packaged or do  not include "Nutrition Facts" on the label, you need to measure the servings in order to count the amount of carbohydrates:  Measure the foods that you will eat with a food scale or measuring cup, if needed.  Decide how many standard-size servings you will eat.  Multiply the number of servings by 15. Most carbohydrate-rich foods have about 15 g of carbohydrates per serving. ? For example, if you eat 8 oz (170 g) of strawberries, you will have eaten 2 servings and 30 g of carbohydrates (2 servings x 15 g = 30 g).  For foods that have more than one food mixed, such as soups and casseroles, you must count the carbohydrates in each food that is included. The following list contains standard serving sizes of common carbohydrate-rich foods. Each of these servings has about 15 g of carbohydrates:   hamburger bun or  English muffin.   oz (15 mL) syrup.   oz (14 g) jelly.  1 slice of bread.  1 six-inch tortilla.  3 oz (85 g) cooked rice or pasta.  4 oz (113 g) cooked dried beans.  4 oz (113 g) starchy vegetable, such as peas, corn, or potatoes.  4 oz (113 g) hot cereal.  4 oz (113 g) mashed potatoes or  of a large baked potato.  4 oz (113 g) canned or frozen fruit.  4 oz (120 mL) fruit juice.  4-6 crackers.  6 chicken nuggets.  6 oz (170 g) unsweetened dry cereal.  6 oz (170 g) plain fat-free yogurt or yogurt sweetened with artificial sweeteners.  8 oz (240 mL) milk.  8 oz (170 g) fresh fruit or one small piece of fruit.  24 oz (680 g) popped popcorn. Example of carbohydrate counting Sample meal  3 oz (85 g) chicken breast.  6 oz (170 g) brown rice.  4 oz (113 g) corn.  8 oz (240 mL) milk.  8 oz (170 g) strawberries with sugar-free whipped topping. Carbohydrate calculation 1. Identify the foods that contain carbohydrates: ? Rice. ? Corn. ? Milk. ? Strawberries. 2. Calculate how many servings you have of each food: ? 2 servings rice. ? 1 serving  corn. ? 1 serving milk. ? 1 serving strawberries. 3. Multiply each number of servings by 15 g: ? 2 servings rice x 15 g = 30 g. ? 1 serving corn x 15 g = 15 g. ? 1 serving milk x 15 g = 15 g. ? 1 serving strawberries x 15 g = 15 g. 4. Add together all of the amounts to find the total grams of carbohydrates eaten: ? 30 g + 15 g + 15 g + 15 g = 75 g of carbohydrates total. Summary  Carbohydrate counting is a method of keeping track of how many carbohydrates you eat.  Eating carbohydrates naturally increases the amount of sugar (glucose) in the blood.  Counting how many carbohydrates you eat helps keep your blood glucose within normal limits, which helps you manage your diabetes.  A diet and nutrition specialist (registered dietitian) can help  you make a meal plan and calculate how many carbohydrates you should have at each meal and snack. This information is not intended to replace advice given to you by your health care provider. Make sure you discuss any questions you have with your health care provider. Document Released: 12/04/2005 Document Revised: 06/13/2017 Document Reviewed: 05/17/2016 Elsevier Interactive Patient Education  2019 [employer].

## 2019-02-03 NOTE — Progress Notes (Signed)
Subjective:   Ernest [name] is a 68 y.o. male who presents for Medicare Annual/Subsequent preventive examination.  Review of Systems:  Cardiac Risk Factors include: advanced age (>62men, >44 women);diabetes mellitus;hypertension;male gender;obesity (BMI >30kg/m2);dyslipidemia     Objective:    Vitals: BP 138/78 (BP Location: Left Arm, Patient Position: Sitting, Cuff Size: Normal)   Pulse (!) 58   Temp 98.9 F (37.2 C) (Temporal)   Resp 17   Ht 5\' 10"  (1.778 m)   Wt 271 lb (122.9 kg)   BMI 38.88 kg/m   Body mass index is 38.88 kg/m.  Advanced Directives 02/03/2019 01/07/2019 08/05/2018 07/02/2018 07/02/2018 06/26/2018 04/25/2018  Does Patient Have a Medical Advance Directive? No No No No No No No  Would patient like information on creating a medical advance directive? Yes (MAU/Ambulatory/Procedural Areas - Information given) Yes (MAU/Ambulatory/Procedural Areas - Information given) No - Patient declined No - Patient declined No - Patient declined No - Patient declined No - Patient declined    Tobacco Social History   Tobacco Use  Smoking Status Former Smoker  . Packs/day: 4.00  . Years: 28.00  . Pack years: 112.00  . Types: Cigarettes  . Last attempt to quit: 08/25/1993  . Years since quitting: 25.4  Smokeless Tobacco Never Used     Counseling given: Not Answered   Clinical Intake:  Pre-visit preparation completed: Yes  Pain : No/denies pain     Nutritional Status: BMI > 30  Obese Nutritional Risks: None Diabetes: Yes CBG done?: No Did pt. bring in CBG monitor from home?: No  Nutrition Risk Assessment:  Has the patient had any N/V/D within the last 2 months?  No  Does the patient have any non-healing wounds?  No  Has the patient had any unintentional weight loss or weight gain?  No   Diabetes:  Is the patient diabetic?  Yes  If diabetic, was a CBG obtained today?  No  Did the patient bring in their glucometer from home?  No  How often do you monitor  your CBG's? As needed.   Financial Strains and Diabetes Management:  Are you having any financial strains with the device, your supplies or your medication? No .  Does the patient want to be seen by Chronic Care Management for management of their diabetes?  No  Would the patient like to be referred to a Nutritionist or for Diabetic Management?  Yes , patient agreed to see CCM when they come to the office next month, will place referral when they start at our office.   Diabetic Exams:  Diabetic Eye Exam: Completed 04/19/2018. Pt has been advised about the importance in completing this exam.   Diabetic Foot Exam: Pt has been advised about the importance in completing this exam. Pt is scheduled for diabetic foot exam on 05/27/2019  How often do you need to have someone help you when you read instructions, pamphlets, or other written materials from your doctor or pharmacy?: 1 - Never What is the last grade level you completed in school?: 8th grade  Interpreter Needed?: No  Information entered by :: Tiffany Hill,LPN   Past Medical History:  Diagnosis Date  . Asthma   . COPD (chronic obstructive pulmonary disease) ([place])   . Diabetes mellitus    Type II  . Early Pulmonary fibrosis ([place])   . GERD (gastroesophageal reflux disease)   . History of echocardiogram    a. 02/2017 Echo: EF 60-65%, no rwma, mild MR, mod dil LA. Nl  RV fxn. PASP 91mmHg.  Marland Kitchen Hyperlipidemia   . Hypertension   . Morbid obesity ([place])   . Non-obstructive CAD (coronary artery disease)    a. 2011 Cath: nonobs dzs; b. 07/2014 Cath: LM 30, LAD nl, LCX nl, RCA 20p, 78m.  Marland Kitchen Permanent atrial fibrillation    a. CHA2DS2VASc = 4-->xarelto.   Past Surgical History:  Procedure Laterality Date  . ANKLE SURGERY    . CARDIAC CATHETERIZATION  05/2010   [facility]  . CARDIAC CATHETERIZATION     [facility]  . CARDIAC CATHETERIZATION     [facility]  . CARDIAC CATHETERIZATION  07/2014   [facility]  . COLONOSCOPY    . COLONOSCOPY WITH PROPOFOL N/A 06/26/2018    Procedure: COLONOSCOPY WITH PROPOFOL;  Surgeon: Virgel Manifold, MD;  Location: [facility] ENDOSCOPY;  Service: Endoscopy;  Laterality: N/A;  . ENTEROSCOPY N/A 08/05/2018   Procedure: ENTEROSCOPY;  Surgeon: Virgel Manifold, MD;  Location: [facility] ENDOSCOPY;  Service: Endoscopy;  Laterality: N/A;  . ESOPHAGOGASTRODUODENOSCOPY (EGD) WITH PROPOFOL N/A 06/26/2018   Procedure: ESOPHAGOGASTRODUODENOSCOPY (EGD) WITH PROPOFOL;  Surgeon: Virgel Manifold, MD;  Location: [facility] ENDOSCOPY;  Service: Endoscopy;  Laterality: N/A;  . TESTICLE SURGERY  70's  . TUMOR EXCISION     Neck and finger; benign  . WRIST SURGERY     Family History  Problem Relation Age of Onset  . Hypertension Mother   . Hyperlipidemia Mother   . Diabetes Mother   . Heart disease Mother        CABG  . Pneumonia Father        rare type  . Diabetes Other   . Depression Other   . Coronary artery disease Other   . Alcohol abuse Other   . Hypertension Other   . Hyperlipidemia Other   . Cancer Brother   . Kidney Stones Brother    Social History   Socioeconomic History  . Marital status: Widowed    Spouse name: Not on file  . Number of children: Not on file  . Years of education: Not on file  . Highest education level: 8th grade  Occupational History  . Occupation: part time   Social Needs  . Financial resource strain: Not hard at all  . Food insecurity:    Worry: Never true    Inability: Never true  . Transportation needs:    Medical: No    Non-medical: No  Tobacco Use  . Smoking status: Former Smoker    Packs/day: 4.00    Years: 28.00    Pack years: 112.00    Types: Cigarettes    Last attempt to quit: 08/25/1993    Years since quitting: 25.4  . Smokeless tobacco: Never Used  Substance and Sexual Activity  . Alcohol use: Not Currently    Comment: rarely, occasional beer   . Drug use: No  . Sexual activity: Not on file  Lifestyle  . Physical activity:    Days per week: 0 days    Minutes per session: 0  min  . Stress: Not at all  Relationships  . Social connections:    Talks on phone: More than three times a week    Gets together: More than three times a week    Attends religious service: Never    Active member of club or organization: No    Attends meetings of clubs or organizations: Never    Relationship status: Widowed  Other Topics Concern  . Not on file  Social History Narrative  No regular exercise.    Outpatient Encounter Medications as of 02/03/2019  Medication Sig  . albuterol (PROVENTIL HFA;VENTOLIN HFA) 108 (90 Base) MCG/ACT inhaler Inhale 2 puffs into the lungs every 4 (four) hours as needed for wheezing or shortness of breath.  Marland Kitchen atorvastatin (LIPITOR) 40 MG tablet Take 1 tablet (40 mg total) by mouth daily.  . budesonide-formoterol (SYMBICORT) 160-4.5 MCG/ACT inhaler Inhale 2 puffs into the lungs 2 (two) times daily.  . canagliflozin (INVOKANA) 300 MG TABS tablet Take 1 tablet (300 mg total) by mouth daily before breakfast.  . diltiazem (CARDIZEM CD) 180 MG 24 hr capsule Take 1 capsule (180 mg total) by mouth daily.  . furosemide (LASIX) 40 MG tablet Take 0.5 tablets (20 mg total) by mouth 2 (two) times daily.  Marland Kitchen gabapentin (NEURONTIN) 600 MG tablet Take 2 tablets (1,200 mg total) by mouth at bedtime.  Marland Kitchen glipiZIDE (GLUCOTROL) 5 MG tablet TAKE 1 TABLET BY MOUTH  DAILY BEFORE BREAKFAST  . isosorbide mononitrate (IMDUR) 60 MG 24 hr tablet Take 1 tablet (60 mg total) by mouth daily.  . metFORMIN (GLUCOPHAGE) 500 MG tablet Take 2 tablets (1,000 mg total) by mouth 2 (two) times daily.  . metoprolol succinate (TOPROL-XL) 25 MG 24 hr tablet Take 1 tablet (25 mg total) by mouth daily.  Marland Kitchen omeprazole (PRILOSEC) 20 MG capsule Take 1 capsule (20 mg total) by mouth 2 (two) times daily before a meal.  . ramipril (ALTACE) 10 MG capsule Take 1 capsule (10 mg total) by mouth daily.  . rivaroxaban (XARELTO) 20 MG TABS tablet TAKE 1 TABLET(20 MG) BY MOUTH DAILY WITH SUPPER  . triamcinolone  ointment (KENALOG) 0.5 % Apply 1 application topically 2 (two) times daily. (Patient not taking: Reported on 02/03/2019)   No facility-administered encounter medications on file as of 02/03/2019.     Activities of Daily Living In your present state of health, do you have any difficulty performing the following activities: 02/03/2019  Hearing? Y  Comment difficulty from left ear  Vision? Y  Comment sometimes   Difficulty concentrating or making decisions? N  Walking or climbing stairs? N  Dressing or bathing? N  Doing errands, shopping? N  Preparing Food and eating ? N  Using the Toilet? N  In the past six months, have you accidently leaked urine? N  Do you have problems with loss of bowel control? N  Managing your Medications? N  Managing your Finances? N  Housekeeping or managing your Housekeeping? N  Some recent data might be hidden    Patient Care Team: Guadalupe Maple, MD as PCP - General (Family Medicine) Minna Merritts, MD as PCP - Cardiology (Cardiology)   Assessment:   This is a routine wellness examination for [employer].  Exercise Activities and Dietary recommendations Current Exercise Habits: The patient has a physically strenuous job, but has no regular exercise apart from work., Exercise limited by: None identified  Goals    . DIET - INCREASE WATER INTAKE      recommend drinking at least 6-8 glasses of water a day     . DIET - REDUCE SUGAR INTAKE       Fall Risk Fall Risk  02/03/2019 05/28/2018 03/13/2018 02/05/2018 02/01/2018  Falls in the past year? 1 Yes Yes Yes Yes  Comment falls at work  - - - -  Number falls in past yr: 1 1 2  or more 1 1  Injury with Fall? 1 Yes Yes Yes Yes  Risk Factor Category  - -  High Fall Risk High Fall Risk High Fall Risk  Risk for fall due to : History of fall(s) - - - -  Follow up Falls prevention discussed;Falls evaluation completed;Education provided Falls evaluation completed Falls evaluation completed - Falls prevention  discussed   FALL RISK PREVENTION PERTAINING TO THE HOME:  Any stairs in or around the home WITH handrails? Yes stairs outside with handrails  Home free of loose throw rugs in walkways, pet beds, electrical cords, etc? No  Adequate lighting in your home to reduce risk of falls? Yes   ASSISTIVE DEVICES UTILIZED TO PREVENT FALLS:  Life alert? No  Use of a cane, walker or w/c? No  Grab bars in the bathroom? No  Shower chair or bench in shower? No  Elevated toilet seat or a handicapped toilet? No   DME ORDERS:  DME order needed?  No   TIMED UP AND GO:  Was the test performed? Yes .  Length of time to ambulate 10 feet: 12 sec.   GAIT:  Appearance of gait: Gait stead-fast without the use of an assistive device.  Education: Fall risk prevention has been discussed.  Intervention(s) required? No    Depression Screen PHQ 2/9 Scores 02/03/2019 05/28/2018 02/01/2018 11/02/2017  PHQ - 2 Score 1 0 0 0    Cognitive Function     6CIT Screen 02/03/2019 02/01/2018  What Year? 0 points 0 points  What month? 0 points 0 points  What time? 0 points 0 points  Count back from 20 0 points 0 points  Months in reverse 0 points 0 points  Repeat phrase 2 points 2 points  Total Score 2 2    Immunization History  Administered Date(s) Administered  . Influenza Split 08/25/2013  . Influenza, High Dose Seasonal PF 09/20/2016, 11/02/2017, 10/10/2018  . Influenza,inj,Quad PF,6+ Mos 11/17/2015  . Influenza-Unspecified 12/23/2014  . Pneumococcal Conjugate-13 11/25/2015  . Pneumococcal Polysaccharide-23 10/25/2013, 02/03/2019  . Pneumococcal-Unspecified 10/26/2009  . Tdap 11/06/2012  . Zoster 02/04/2013    Qualifies for Shingles Vaccine? Yes  Zostavax completed 02/04/2013. Due for Shingrix. Education has been provided regarding the importance of this vaccine. Pt has been advised to call insurance company to determine out of pocket expense. Advised may also receive vaccine at local pharmacy or Health  Dept. Verbalized acceptance and understanding.  Tdap: up to date   Flu Vaccine: up to date   Pneumococcal Vaccine: Due for Pneumococcal vaccine. Does the patient want to receive this vaccine today?  Yes . Done today   Screening Tests Health Maintenance  Topic Date Due  . FOOT EXAM  11/24/2016  . PNA vac Low Risk Adult (2 of 2 - PPSV23) 10/25/2018  . OPHTHALMOLOGY EXAM  04/20/2019  . HEMOGLOBIN A1C  07/28/2019  . TETANUS/TDAP  11/06/2022  . COLONOSCOPY  06/27/2023  . INFLUENZA VACCINE  Completed  . Hepatitis C Screening  Completed   Cancer Screenings:  Colorectal Screening: Completed 06/26/2018. Repeat every 5 years  Lung Cancer Screening: (Low Dose CT Chest recommended if Age 57-80 years, 30 pack-year currently smoking OR have quit w/in 15years.) does not qualify.    Additional Screening:  Hepatitis C Screening: does qualify; Completed 11/25/2015  Vision Screening: Recommended annual ophthalmology exams for early detection of glaucoma and other disorders of the eye. Is the patient up to date with their annual eye exam?  Yes  Who is the provider or what is the name of the office in which the pt attends annual eye exams? [facility]  Dental Screening: Recommended annual dental exams for proper oral hygiene  Community Resource Referral:  CRR required this visit?  No       Plan:    I have personally reviewed and addressed the Medicare Annual Wellness questionnaire and have noted the following in the patient's chart:  A. Medical and social history B. Use of alcohol, tobacco or illicit drugs  C. Current medications and supplements D. Functional ability and status E.  Nutritional status F.  Physical activity G. Advance directives H. List of other physicians I.  Hospitalizations, surgeries, and ER visits in previous 12 months J.  [place] such as hearing and vision if needed, cognitive and depression L. Referrals and appointments   In addition, I  have reviewed and discussed with patient certain preventive protocols, quality metrics, and best practice recommendations. A written personalized care plan for preventive services as well as general preventive health recommendations were provided to patient.   Signed,  Tyler Aas, LPN Nurse Health Advisor   Nurse Notes: requesting sleep study per DOT physical (Done at [facility] 01/29/2019) , has had in the 80's at [facility]. Needs before the 12th of May. Advised to make an appt with Dr.Crissman for referral.

## 2019-02-04 ENCOUNTER — Ambulatory Visit (INDEPENDENT_AMBULATORY_CARE_PROVIDER_SITE_OTHER): Payer: Medicare Other | Admitting: Family Medicine

## 2019-02-04 ENCOUNTER — Encounter: Payer: Self-pay | Admitting: Family Medicine

## 2019-02-04 VITALS — BP 137/75 | HR 75 | Temp 98.4°F | Ht 70.0 in | Wt 268.3 lb

## 2019-02-04 DIAGNOSIS — Z8669 Personal history of other diseases of the nervous system and sense organs: Secondary | ICD-10-CM

## 2019-02-04 NOTE — Progress Notes (Signed)
BP 137/75 (BP Location: Right Arm, Patient Position: Sitting, Cuff Size: Large)   Pulse 75   Temp 98.4 F (36.9 C) (Oral)   Ht 5\' 10"  (1.778 m)   Wt 268 lb 4.8 oz (121.7 kg)   SpO2 97%   BMI 38.50 kg/m    Subjective:    Patient ID: Ernest [name], male    DOB: 04-07-1951, 68 y.o.   MRN: [id number]  HPI: Ernest [name] is a 68 y.o. male  Chief Complaint  Patient presents with  . Snoring    Patient states he needs a sleep study.    Here today requesting sleep study per the advice of DOT certifying examiner. Has always had issues with sleep, sleeps in a chair often and gets broken sleep. Often tired during the day. Snores often. Had a sleep study a long time ago and was diagnosed with sleep apnea and given a CPAP but could not tolerate it so stopped wearing it.   SLEEP APNEA Sleep apnea status: uncontrolled, controlled, better, worse, stable and fluctuating Duration: chronic Satisfied with current treatment?:  no CPAP use:  no Sleep quality with CPAP use: could not tolerate device Treament compliance:poor compliance Last sleep study: "years ago" Treatments attempted:  CPAP Wakes feeling refreshed:  no Daytime hypersomnolence:  yes Fatigue:  yes Insomnia:  yes Good sleep hygiene:  no Difficulty falling asleep:  yes Difficulty staying asleep:  yes Snoring bothers bed partner:  unsure Observed apnea by bed partner: n/a Obesity:  yes Hypertension: yes  Pulmonary hypertension:  no Coronary artery disease:  yes Relevant past medical, surgical, family and social history reviewed and updated as indicated. Interim medical history since our last visit reviewed. Allergies and medications reviewed and updated.  Review of Systems  Per HPI unless specifically indicated above     Objective:    BP 137/75 (BP Location: Right Arm, Patient Position: Sitting, Cuff Size: Large)   Pulse 75   Temp 98.4 F (36.9 C) (Oral)   Ht 5\' 10"  (1.778 m)   Wt 268 lb 4.8 oz (121.7  kg)   SpO2 97%   BMI 38.50 kg/m   Wt Readings from Last 3 Encounters:  02/04/19 268 lb 4.8 oz (121.7 kg)  02/03/19 271 lb (122.9 kg)  01/27/19 264 lb 6.4 oz (119.9 kg)    Physical Exam Vitals signs and nursing note reviewed.  Constitutional:      Appearance: Normal appearance. He is obese.  HENT:     Head: Atraumatic.  Eyes:     Extraocular Movements: Extraocular movements intact.     Conjunctiva/sclera: Conjunctivae normal.  Neck:     Musculoskeletal: Normal range of motion and neck supple.  Cardiovascular:     Rate and Rhythm: Normal rate and regular rhythm.  Pulmonary:     Effort: Pulmonary effort is normal.     Breath sounds: Normal breath sounds.  Musculoskeletal: Normal range of motion.  Skin:    General: Skin is warm and dry.  Neurological:     General: No focal deficit present.     Mental Status: He is oriented to person, place, and time.  Psychiatric:        Mood and Affect: Mood normal.        Thought Content: Thought content normal.        Judgment: Judgment normal.     Results for orders placed or performed in visit on 01/27/19  CoaguChek XS/INR Waived  Result Value Ref Range   INR  1.0 0.9 - 1.1   Prothrombin Time 12.2 sec  Lipid Panel w/o Chol/HDL Ratio  Result Value Ref Range   Cholesterol, Total 209 (H) 100 - 199 mg/dL   Triglycerides 340 (H) 0 - 149 mg/dL   HDL 30 (L) >39 mg/dL   VLDL Cholesterol Cal 68 (H) 5 - 40 mg/dL   LDL Calculated 111 (H) 0 - 99 mg/dL  Comprehensive metabolic panel  Result Value Ref Range   Glucose 199 (H) 65 - 99 mg/dL   BUN 14 8 - 27 mg/dL   Creatinine, Ser 1.17 0.76 - 1.27 mg/dL   GFR calc non Af Amer 64 >59 mL/min/1.73   GFR calc Af Amer 74 >59 mL/min/1.73   BUN/Creatinine Ratio 12 10 - 24   Sodium 142 134 - 144 mmol/L   Potassium 3.8 3.5 - 5.2 mmol/L   Chloride 101 96 - 106 mmol/L   CO2 23 20 - 29 mmol/L   Calcium 9.5 8.6 - 10.2 mg/dL   Total Protein 6.7 6.0 - 8.5 g/dL   Albumin 4.4 3.8 - 4.8 g/dL   Globulin,  Total 2.3 1.5 - 4.5 g/dL   Albumin/Globulin Ratio 1.9 1.2 - 2.2   Bilirubin Total 0.9 0.0 - 1.2 mg/dL   Alkaline Phosphatase 82 39 - 117 IU/L   AST 32 0 - 40 IU/L   ALT 39 0 - 44 IU/L  HgB A1c  Result Value Ref Range   Hgb A1c MFr Bld 8.8 (H) 4.8 - 5.6 %   Est. average glucose Bld gHb Est-mCnc 206 mg/dL      Assessment & Plan:   Problem List Items Addressed This Visit    None    Visit Diagnoses    History of sleep apnea    -  Primary   Will refer for new sleep study as it's been many years per pt and he has reported hx of OSA. Weight loss, sleep hygiene reviewed   Relevant Orders   Ambulatory referral to Sleep Studies       Follow up plan: Return for as scheduled for 3 month DM f/u.

## 2019-02-13 DIAGNOSIS — G4733 Obstructive sleep apnea (adult) (pediatric): Secondary | ICD-10-CM | POA: Diagnosis not present

## 2019-02-14 IMAGING — CT CT MAXILLOFACIAL W/O CM
3 series · 16 of 40 positions shown, 18 images · non-contrast
Comparison: None.

CLINICAL DATA: Head trauma on anti coagulation.

EXAM:
CT HEAD WITHOUT CONTRAST
CT MAXILLOFACIAL WITHOUT CONTRAST
TECHNIQUE: Multidetector CT imaging of the head and maxillofacial structures
were performed using the standard protocol without intravenous
contrast. Multiplanar CT image reconstructions of the maxillofacial
structures were also generated.

[Series 2: head wo · axial · 0.49mm/px · z∈[-70,+30]mm · 6 of 30 slices shown, 8 images]
[im 5/30  brain]
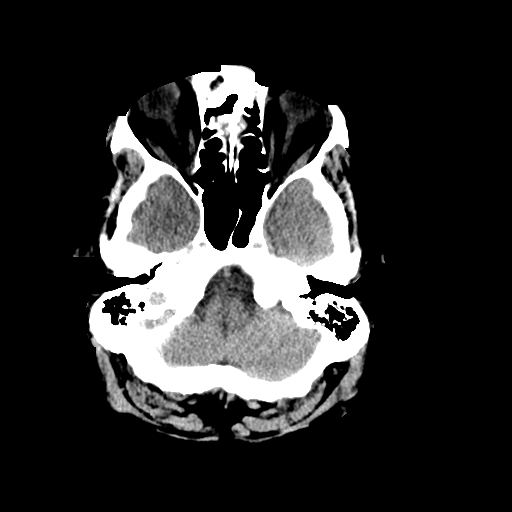
[im 5/30  bone]
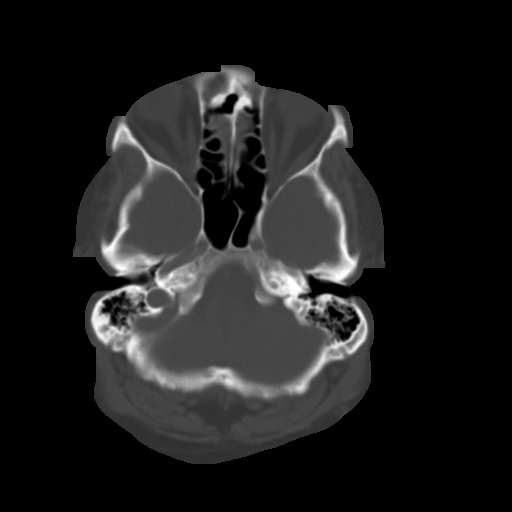
[im 9/30  bone]
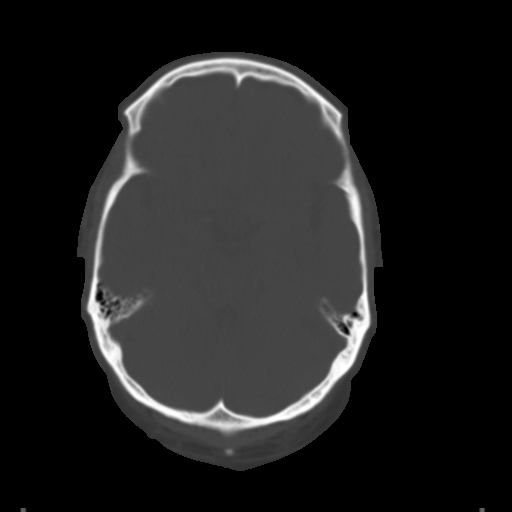
[im 13/30  bone]
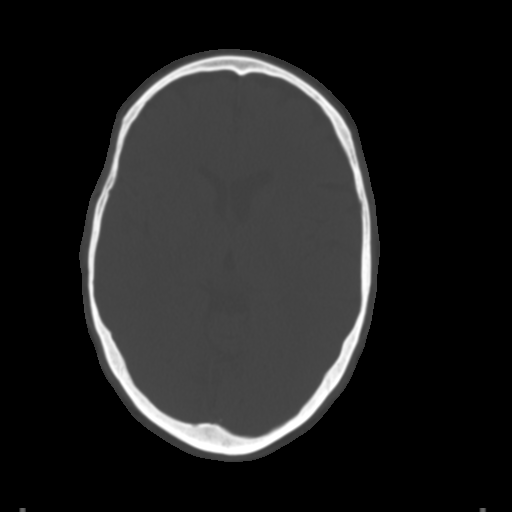
[im 17/30  bone]
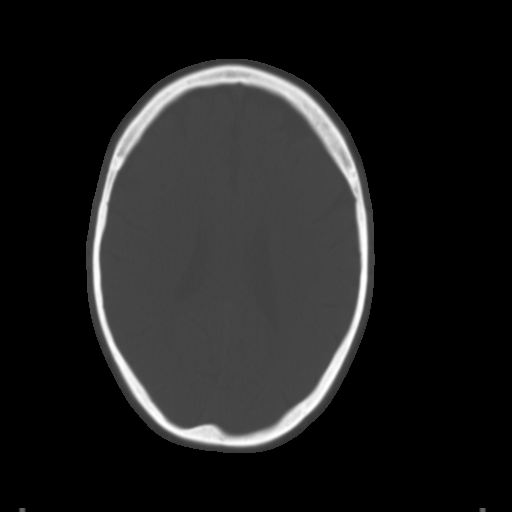
[im 21/30  brain]
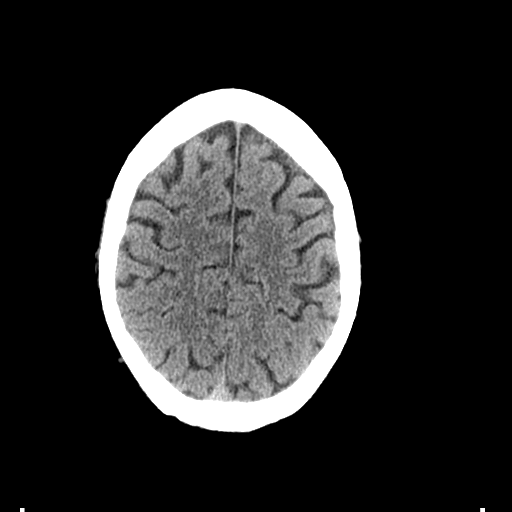
[im 21/30  bone]
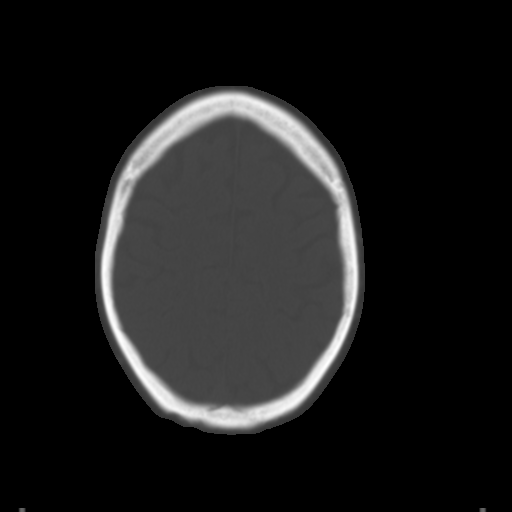
[im 25/30  bone]
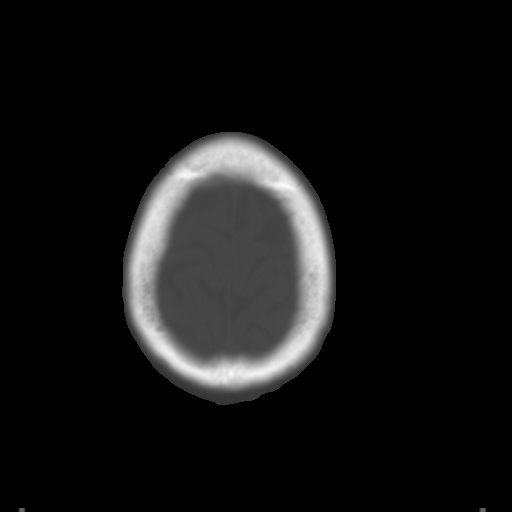

[Series 3: max soft · axial · 0.42mm/px · z∈[-192,-70]mm · 7 of 87 slices shown]
[im 9/87  brain]
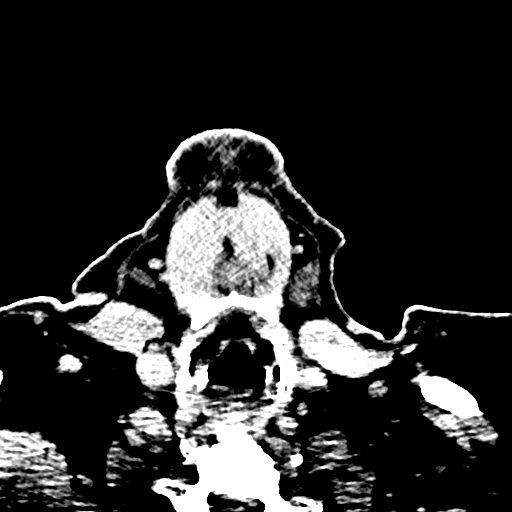
[im 17/87  brain]
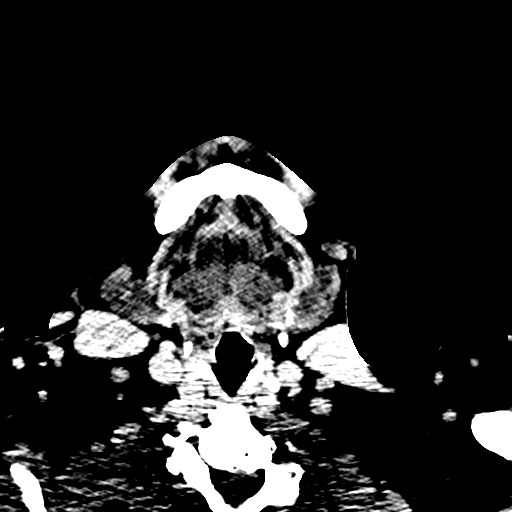
[im 29/87  brain]
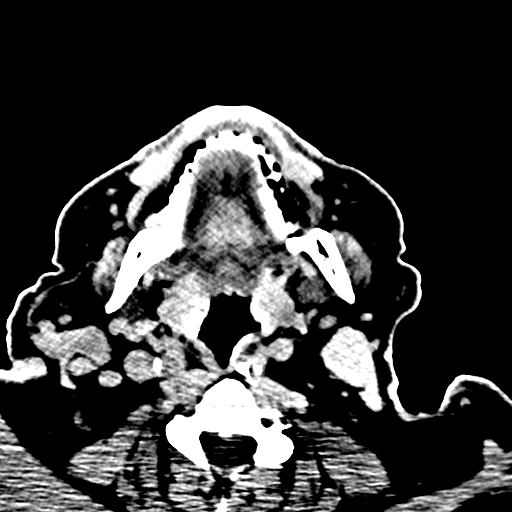
[im 37/87  brain]
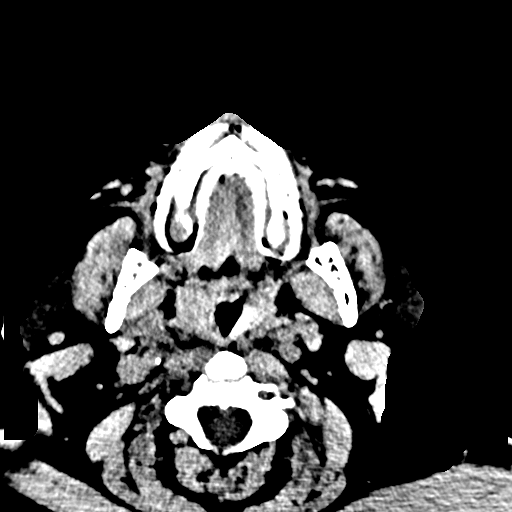
[im 50/87  brain]
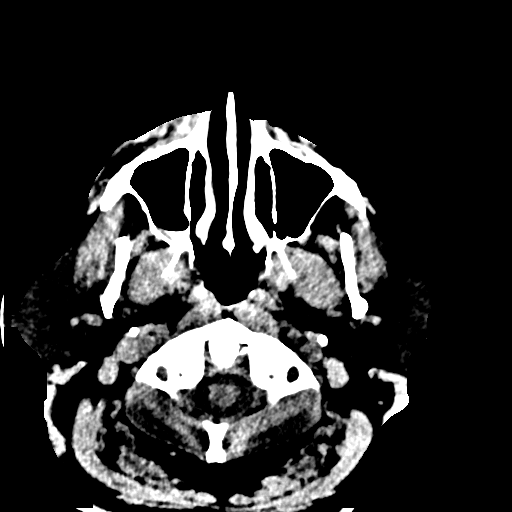
[im 58/87  brain]
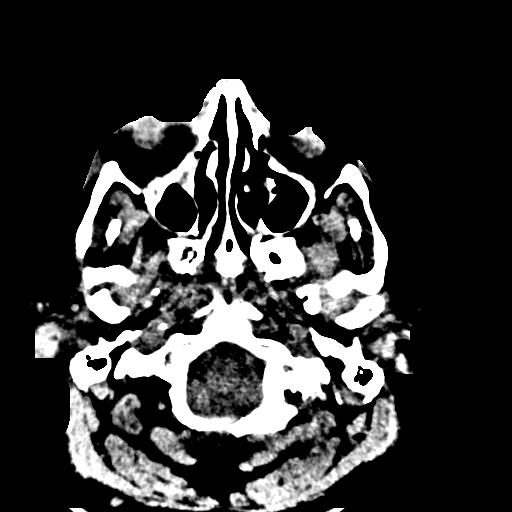
[im 70/87  brain]
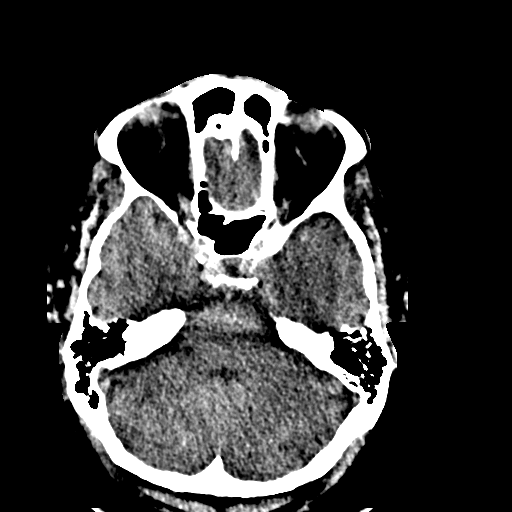

[Series 7: coronal bone · coronal · 0.34mm/px · 3 of 87 slices shown]
[im 29/87  bone]
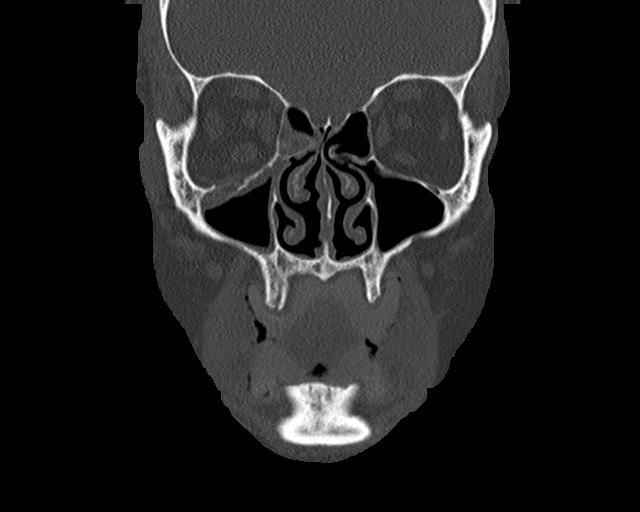
[im 39/87  bone]
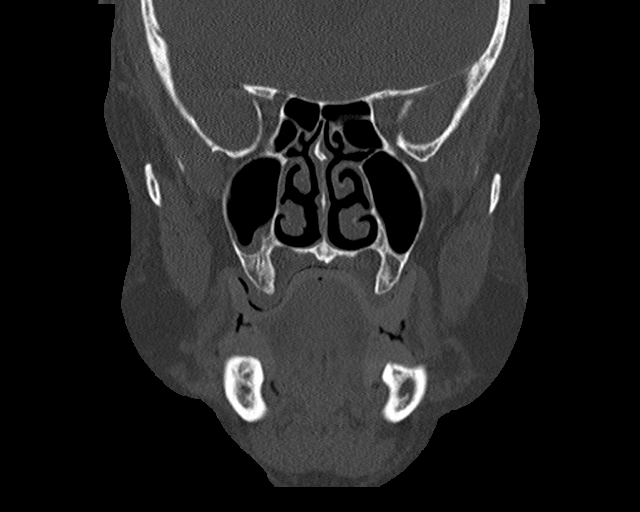
[im 48/87  bone]
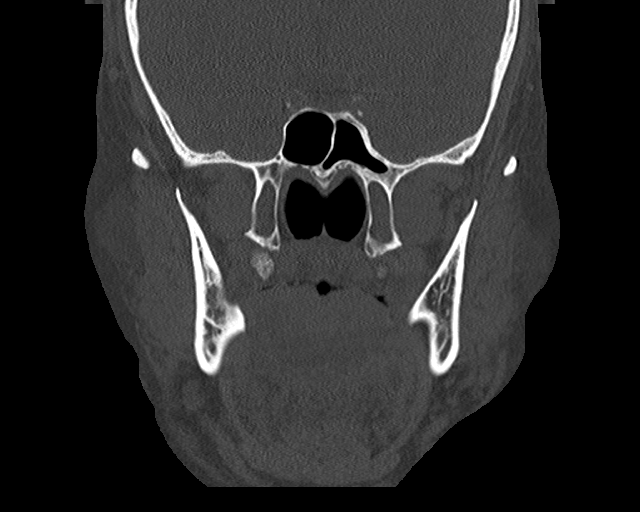

[16 of 40 positions shown; findings below may reference images not displayed]

FINDINGS: CT HEAD FINDINGS

Brain: No mass lesion, intraparenchymal hemorrhage or extra-axial
collection. No evidence of acute cortical infarct. Brain parenchyma
and CSF-containing spaces are normal for age.

Vascular: No hyperdense vessel or unexpected calcification.

Skull: No calvarial fracture. Normal skull base.

CT MAXILLOFACIAL FINDINGS

Osseous:

--Complex facial fracture types: No [place], zygomaticomaxillary
complex or nasoorbitoethmoidal fracture.

--Simple fracture types: Minimally displaced fracture of the floor
of the right orbit that traverses the infraorbital foramen. No
herniation of the extraocular muscles. Intraconal structures are
normal.

--Mandible, hard palate and teeth: No acute abnormality.

Orbits: The globes are intact. Normal appearance of the intra- and
extraconal fat. Symmetric extraocular muscles. Fracture of the right
orbital floor as above.

Sinuses: Mild mucosal thickening in and probable blood products in
the right maxillary sinus.

Soft tissues: Small right periorbital hematoma.
IMPRESSION: 1. No intracranial abnormality.
2. Fracture of the right orbital floor, traversing the infraorbital
foramen, with small associated periorbital hematoma.

## 2019-02-25 DIAGNOSIS — G4731 Primary central sleep apnea: Secondary | ICD-10-CM | POA: Diagnosis not present

## 2019-03-04 DIAGNOSIS — G4731 Primary central sleep apnea: Secondary | ICD-10-CM | POA: Diagnosis not present

## 2019-03-06 ENCOUNTER — Emergency Department: Payer: Medicare Other

## 2019-03-06 ENCOUNTER — Inpatient Hospital Stay
Admission: EM | Admit: 2019-03-06 | Discharge: 2019-03-08 | DRG: 246 | Disposition: A | Discharge status: Home / Self Care | Payer: Medicare Other | Attending: Internal Medicine | Admitting: Internal Medicine

## 2019-03-06 ENCOUNTER — Other Ambulatory Visit: Payer: Self-pay

## 2019-03-06 DIAGNOSIS — Z7951 Long term (current) use of inhaled steroids: Secondary | ICD-10-CM | POA: Diagnosis not present

## 2019-03-06 DIAGNOSIS — Z7984 Long term (current) use of oral hypoglycemic drugs: Secondary | ICD-10-CM | POA: Diagnosis not present

## 2019-03-06 DIAGNOSIS — E782 Mixed hyperlipidemia: Secondary | ICD-10-CM | POA: Diagnosis not present

## 2019-03-06 DIAGNOSIS — Z6835 Body mass index (BMI) 35.0-35.9, adult: Secondary | ICD-10-CM | POA: Diagnosis not present

## 2019-03-06 DIAGNOSIS — G2581 Restless legs syndrome: Secondary | ICD-10-CM | POA: Diagnosis present

## 2019-03-06 DIAGNOSIS — R7989 Other specified abnormal findings of blood chemistry: Secondary | ICD-10-CM | POA: Diagnosis not present

## 2019-03-06 DIAGNOSIS — I340 Nonrheumatic mitral (valve) insufficiency: Secondary | ICD-10-CM | POA: Diagnosis present

## 2019-03-06 DIAGNOSIS — J9601 Acute respiratory failure with hypoxia: Secondary | ICD-10-CM | POA: Diagnosis present

## 2019-03-06 DIAGNOSIS — Z833 Family history of diabetes mellitus: Secondary | ICD-10-CM | POA: Diagnosis not present

## 2019-03-06 DIAGNOSIS — R0902 Hypoxemia: Secondary | ICD-10-CM | POA: Diagnosis not present

## 2019-03-06 DIAGNOSIS — Z8349 Family history of other endocrine, nutritional and metabolic diseases: Secondary | ICD-10-CM

## 2019-03-06 DIAGNOSIS — Z7901 Long term (current) use of anticoagulants: Secondary | ICD-10-CM | POA: Diagnosis not present

## 2019-03-06 DIAGNOSIS — Z9861 Coronary angioplasty status: Secondary | ICD-10-CM | POA: Diagnosis not present

## 2019-03-06 DIAGNOSIS — K219 Gastro-esophageal reflux disease without esophagitis: Secondary | ICD-10-CM | POA: Diagnosis present

## 2019-03-06 DIAGNOSIS — J439 Emphysema, unspecified: Secondary | ICD-10-CM | POA: Diagnosis present

## 2019-03-06 DIAGNOSIS — J8410 Pulmonary fibrosis, unspecified: Secondary | ICD-10-CM | POA: Diagnosis not present

## 2019-03-06 DIAGNOSIS — E785 Hyperlipidemia, unspecified: Secondary | ICD-10-CM | POA: Diagnosis not present

## 2019-03-06 DIAGNOSIS — Z79899 Other long term (current) drug therapy: Secondary | ICD-10-CM | POA: Diagnosis not present

## 2019-03-06 DIAGNOSIS — I4821 Permanent atrial fibrillation: Secondary | ICD-10-CM | POA: Diagnosis present

## 2019-03-06 DIAGNOSIS — E119 Type 2 diabetes mellitus without complications: Secondary | ICD-10-CM | POA: Diagnosis present

## 2019-03-06 DIAGNOSIS — R079 Chest pain, unspecified: Secondary | ICD-10-CM

## 2019-03-06 DIAGNOSIS — I249 Acute ischemic heart disease, unspecified: Secondary | ICD-10-CM | POA: Diagnosis not present

## 2019-03-06 DIAGNOSIS — I472 Ventricular tachycardia: Secondary | ICD-10-CM | POA: Diagnosis not present

## 2019-03-06 DIAGNOSIS — I2510 Atherosclerotic heart disease of native coronary artery without angina pectoris: Secondary | ICD-10-CM | POA: Diagnosis not present

## 2019-03-06 DIAGNOSIS — I10 Essential (primary) hypertension: Secondary | ICD-10-CM | POA: Diagnosis present

## 2019-03-06 DIAGNOSIS — I4891 Unspecified atrial fibrillation: Secondary | ICD-10-CM | POA: Diagnosis not present

## 2019-03-06 DIAGNOSIS — R0600 Dyspnea, unspecified: Secondary | ICD-10-CM | POA: Diagnosis not present

## 2019-03-06 DIAGNOSIS — Z881 Allergy status to other antibiotic agents status: Secondary | ICD-10-CM | POA: Diagnosis not present

## 2019-03-06 DIAGNOSIS — I214 Non-ST elevation (NSTEMI) myocardial infarction: Principal | ICD-10-CM

## 2019-03-06 DIAGNOSIS — E669 Obesity, unspecified: Secondary | ICD-10-CM | POA: Diagnosis present

## 2019-03-06 DIAGNOSIS — Z87891 Personal history of nicotine dependence: Secondary | ICD-10-CM

## 2019-03-06 DIAGNOSIS — Z8249 Family history of ischemic heart disease and other diseases of the circulatory system: Secondary | ICD-10-CM

## 2019-03-06 DIAGNOSIS — E118 Type 2 diabetes mellitus with unspecified complications: Secondary | ICD-10-CM | POA: Diagnosis not present

## 2019-03-06 LAB — CBC
HCT: 45.1 % (ref 39.0–52.0)
Hemoglobin: 15.2 g/dL (ref 13.0–17.0)
MCH: 28.1 pg (ref 26.0–34.0)
MCHC: 33.7 g/dL (ref 30.0–36.0)
MCV: 83.5 fL (ref 80.0–100.0)
Platelets: 176 10*3/uL (ref 150–400)
RBC: 5.4 MIL/uL (ref 4.22–5.81)
RDW: 14 % (ref 11.5–15.5)
WBC: 8.8 10*3/uL (ref 4.0–10.5)
nRBC: 0 % (ref 0.0–0.2)

## 2019-03-06 LAB — COMPREHENSIVE METABOLIC PANEL
ALT: 21 U/L (ref 0–44)
AST: 22 U/L (ref 15–41)
Albumin: 4.7 g/dL (ref 3.5–5.0)
Alkaline Phosphatase: 52 U/L (ref 38–126)
Anion gap: 12 (ref 5–15)
BUN: 19 mg/dL (ref 8–23)
CO2: 24 mmol/L (ref 22–32)
Calcium: 10.1 mg/dL (ref 8.9–10.3)
Chloride: 106 mmol/L (ref 98–111)
Creatinine, Ser: 0.93 mg/dL (ref 0.61–1.24)
GFR calc Af Amer: >60 mL/min (ref >60)
GFR calc non Af Amer: >60 mL/min (ref >60)
Glucose, Bld: 141 mg/dL — ABNORMAL HIGH (ref 70–99)
Potassium: 3.7 mmol/L (ref 3.5–5.1)
Sodium: 142 mmol/L (ref 135–145)
Total Bilirubin: 1.8 mg/dL — ABNORMAL HIGH (ref 0.3–1.2)
Total Protein: 7.6 g/dL (ref 6.5–8.1)

## 2019-03-06 LAB — GLUCOSE, CAPILLARY
Glucose-Capillary: 100 mg/dL — ABNORMAL HIGH (ref 70–99)
Glucose-Capillary: 99 mg/dL (ref 70–99)

## 2019-03-06 LAB — APTT: aPTT: 33 seconds (ref 24–36)

## 2019-03-06 LAB — HEPARIN LEVEL (UNFRACTIONATED): Heparin Unfractionated: 0.56 IU/mL (ref 0.30–0.70)

## 2019-03-06 LAB — TROPONIN I: Troponin I: 0.07 ng/mL (ref <0.03)

## 2019-03-06 MED ORDER — HEPARIN (PORCINE) 25000 UT/250ML-% IV SOLN
1450.0000 [IU]/h | INTRAVENOUS | Status: DC
  Administered 2019-03-06: 1300 [IU]/h via INTRAVENOUS
  Filled 2019-03-06: qty 250

## 2019-03-06 MED ORDER — HEPARIN BOLUS VIA INFUSION
4000.0000 [IU] | Freq: Once | INTRAVENOUS | Status: AC
  Administered 2019-03-06: 4000 [IU] via INTRAVENOUS
  Filled 2019-03-06: qty 4000

## 2019-03-06 MED ORDER — INSULIN ASPART 100 UNIT/ML ~~LOC~~ SOLN
0.0000 [IU] | Freq: Three times a day (TID) | SUBCUTANEOUS | Status: DC
  Administered 2019-03-07: 1 [IU] via SUBCUTANEOUS
  Administered 2019-03-07 – 2019-03-08 (×2): 2 [IU] via SUBCUTANEOUS
  Administered 2019-03-08: 1 [IU] via SUBCUTANEOUS
  Filled 2019-03-06 (×4): qty 1

## 2019-03-06 MED ORDER — MOMETASONE FURO-FORMOTEROL FUM 200-5 MCG/ACT IN AERO
2.0000 | INHALATION_SPRAY | Freq: Two times a day (BID) | RESPIRATORY_TRACT | Status: DC
  Administered 2019-03-06 – 2019-03-08 (×4): 2 via RESPIRATORY_TRACT
  Filled 2019-03-06: qty 8.8

## 2019-03-06 MED ORDER — FUROSEMIDE 40 MG PO TABS
20.0000 mg | ORAL_TABLET | Freq: Two times a day (BID) | ORAL | Status: DC
  Administered 2019-03-07: 20 mg via ORAL
  Filled 2019-03-06 (×2): qty 1

## 2019-03-06 MED ORDER — DILTIAZEM HCL ER COATED BEADS 180 MG PO CP24
180.0000 mg | ORAL_CAPSULE | Freq: Every day | ORAL | Status: DC
  Administered 2019-03-08: 180 mg via ORAL
  Filled 2019-03-06: qty 1

## 2019-03-06 MED ORDER — ACETAMINOPHEN 650 MG RE SUPP: 650.0000 mg | Freq: Four times a day (QID) | RECTAL | Status: DC | PRN

## 2019-03-06 MED ORDER — ONDANSETRON HCL 4 MG PO TABS: 4.0000 mg | ORAL_TABLET | Freq: Four times a day (QID) | ORAL | Status: DC | PRN

## 2019-03-06 MED ORDER — ASPIRIN EC 81 MG PO TBEC
81.0000 mg | DELAYED_RELEASE_TABLET | Freq: Every day | ORAL | Status: DC
  Administered 2019-03-07 – 2019-03-08 (×2): 81 mg via ORAL
  Filled 2019-03-06 (×2): qty 1

## 2019-03-06 MED ORDER — ONDANSETRON HCL 4 MG/2ML IJ SOLN: 4.0000 mg | Freq: Four times a day (QID) | INTRAMUSCULAR | Status: DC | PRN

## 2019-03-06 MED ORDER — RAMIPRIL 10 MG PO CAPS
10.0000 mg | ORAL_CAPSULE | Freq: Every day | ORAL | Status: DC
  Administered 2019-03-08: 10 mg via ORAL
  Filled 2019-03-06 (×2): qty 1

## 2019-03-06 MED ORDER — PANTOPRAZOLE SODIUM 40 MG PO TBEC
40.0000 mg | DELAYED_RELEASE_TABLET | Freq: Every day | ORAL | Status: DC
  Administered 2019-03-06 – 2019-03-08 (×3): 40 mg via ORAL
  Filled 2019-03-06 (×4): qty 1

## 2019-03-06 MED ORDER — ATORVASTATIN CALCIUM 20 MG PO TABS: 40.0000 mg | ORAL_TABLET | Freq: Every day | ORAL | Status: DC

## 2019-03-06 MED ORDER — GABAPENTIN 600 MG PO TABS
1200.0000 mg | ORAL_TABLET | Freq: Every day | ORAL | Status: DC
  Administered 2019-03-06 – 2019-03-07 (×2): 1200 mg via ORAL
  Filled 2019-03-06 (×2): qty 2

## 2019-03-06 MED ORDER — ALBUTEROL SULFATE (2.5 MG/3ML) 0.083% IN NEBU: 3.0000 mL | INHALATION_SOLUTION | RESPIRATORY_TRACT | Status: DC | PRN

## 2019-03-06 MED ORDER — METOPROLOL SUCCINATE ER 25 MG PO TB24
25.0000 mg | ORAL_TABLET | Freq: Every day | ORAL | Status: DC
  Administered 2019-03-08: 25 mg via ORAL
  Filled 2019-03-06: qty 1

## 2019-03-06 MED ORDER — INSULIN ASPART 100 UNIT/ML ~~LOC~~ SOLN: 0.0000 [IU] | Freq: Every day | SUBCUTANEOUS | Status: DC

## 2019-03-06 MED ORDER — ACETAMINOPHEN 325 MG PO TABS: 650.0000 mg | ORAL_TABLET | Freq: Four times a day (QID) | ORAL | Status: DC | PRN

## 2019-03-06 MED ORDER — NITROGLYCERIN 2 % TD OINT
0.5000 [in_us] | TOPICAL_OINTMENT | Freq: Four times a day (QID) | TRANSDERMAL | Status: DC
  Administered 2019-03-06: 22:00:00 via TOPICAL
  Administered 2019-03-07: 0.5 [in_us] via TOPICAL
  Filled 2019-03-06 (×2): qty 1

## 2019-03-06 NOTE — ED Notes (Signed)
Critical received 0.07 troponin

## 2019-03-06 NOTE — H&P (Signed)
[facility] NAME: Ernest [name]    MR#:  [id number]  DATE OF BIRTH:  11/29/1951  DATE OF ADMISSION:  03/06/2019  PRIMARY CARE PHYSICIAN: Guadalupe Maple, MD   REQUESTING/REFERRING PHYSICIAN: Dr Harvest Dark  CHIEF COMPLAINT:   Chief Complaint  Patient presents with  . Chest Pain    HISTORY OF PRESENT ILLNESS:  Ernest [name]  is a 68 y.o. male with a known history of atrial fibrillation, obesity presents with chest pain.  He had chest pain last night for about 15 minutes with left arm pain.  Today he felt fairly good and was on the job climbing up and down ladders.  He had to stop and rest numerous times in order to get the job done.  He was feeling so bad when he was going to drive over to the hospital that he had a pull over and call 911.  He was flushed and dizzy was having chest tightness center of his chest 1010 intensity.  Now his pain is about 4-6 out of 10 intensity.  Had some shortness of breath.  No cough.  He has been feeling fatigued.  In the ER his first troponin was borderline at 0.07.  Hospitalist services contacted for further evaluation.  PAST MEDICAL HISTORY:   Past Medical History:  Diagnosis Date  . Asthma   . COPD (chronic obstructive pulmonary disease) ([place])   . Diabetes mellitus    Type II  . Early Pulmonary fibrosis ([place])   . GERD (gastroesophageal reflux disease)   . History of echocardiogram    a. 02/2017 Echo: EF 60-65%, no rwma, mild MR, mod dil LA. Nl RV fxn. PASP 83mmHg.  Marland Kitchen Hyperlipidemia   . Hypertension   . Morbid obesity ([place])   . Non-obstructive CAD (coronary artery disease)    a. 2011 Cath: nonobs dzs; b. 07/2014 Cath: LM 30, LAD nl, LCX nl, RCA 20p, 29m.  Marland Kitchen Permanent atrial fibrillation    a. CHA2DS2VASc = 4-->xarelto.    PAST SURGICAL HISTORY:   Past Surgical History:  Procedure Laterality Date  . ANKLE SURGERY    . CARDIAC CATHETERIZATION  05/2010   [facility]  . CARDIAC  CATHETERIZATION     [facility]  . CARDIAC CATHETERIZATION     [facility]  . CARDIAC CATHETERIZATION  07/2014   [facility]  . COLONOSCOPY    . COLONOSCOPY WITH PROPOFOL N/A 06/26/2018   Procedure: COLONOSCOPY WITH PROPOFOL;  Surgeon: Virgel Manifold, MD;  Location: [facility] ENDOSCOPY;  Service: Endoscopy;  Laterality: N/A;  . ENTEROSCOPY N/A 08/05/2018   Procedure: ENTEROSCOPY;  Surgeon: Virgel Manifold, MD;  Location: [facility] ENDOSCOPY;  Service: Endoscopy;  Laterality: N/A;  . ESOPHAGOGASTRODUODENOSCOPY (EGD) WITH PROPOFOL N/A 06/26/2018   Procedure: ESOPHAGOGASTRODUODENOSCOPY (EGD) WITH PROPOFOL;  Surgeon: Virgel Manifold, MD;  Location: [facility] ENDOSCOPY;  Service: Endoscopy;  Laterality: N/A;  . TESTICLE SURGERY  70's  . TUMOR EXCISION     Neck and finger; benign  . WRIST SURGERY      SOCIAL HISTORY:   Social History   Tobacco Use  . Smoking status: Former Smoker    Packs/day: 4.00    Years: 28.00    Pack years: 112.00    Types: Cigarettes    Last attempt to quit: 08/25/1993    Years since quitting: 25.5  . Smokeless tobacco: Never Used  Substance Use Topics  . Alcohol use: Not Currently    Comment: rarely, occasional beer  FAMILY HISTORY:   Family History  Problem Relation Age of Onset  . Hypertension Mother   . Hyperlipidemia Mother   . Diabetes Mother   . Heart disease Mother        CABG  . Pneumonia Father        rare type  . Diabetes Other   . Depression Other   . Coronary artery disease Other   . Alcohol abuse Other   . Hypertension Other   . Hyperlipidemia Other   . Cancer Brother   . Kidney Stones Brother     DRUG ALLERGIES:   Allergies  Allergen Reactions  . Doxycycline Other (See Comments)    Mouth sores     REVIEW OF SYSTEMS:  CONSTITUTIONAL: No fever, felt flushed.  Positive for fatigue.  EYES: No blurred or double vision.  Wears glasses EARS, NOSE, AND THROAT: No tinnitus or ear pain. No sore throat RESPIRATORY: No cough.  Positive for shortness  of breath.  No wheezing or hemoptysis.  CARDIOVASCULAR: Positive for chest pain.  No orthopnea, edema.  GASTROINTESTINAL: No nausea, vomiting, diarrhea or abdominal pain. No blood in bowel movements.  Positive for constipation GENITOURINARY: No dysuria, hematuria.  ENDOCRINE: No polyuria, nocturia,  HEMATOLOGY: No anemia, easy bruising or bleeding SKIN: No rash or lesion. MUSCULOSKELETAL: Positive for arthritis.   NEUROLOGIC: No tingling, numbness, weakness.  PSYCHIATRY: No anxiety or depression.   MEDICATIONS AT HOME:   Prior to Admission medications   Medication Sig Start Date End Date Taking? Authorizing Provider  atorvastatin (LIPITOR) 40 MG tablet Take 1 tablet (40 mg total) by mouth daily. 01/27/19  Yes Volney American, PA-C  budesonide-formoterol [facility]) 160-4.5 MCG/ACT inhaler Inhale 2 puffs into the lungs 2 (two) times daily. 01/27/19  Yes Volney American, PA-C  canagliflozin [facility]) 300 MG TABS tablet Take 1 tablet (300 mg total) by mouth daily before breakfast. 01/02/19  Yes Crissman, Jeannette How, MD  diltiazem (CARDIZEM CD) 180 MG 24 hr capsule Take 1 capsule (180 mg total) by mouth daily. 06/14/18 03/06/19 Yes Theora Gianotti, NP  furosemide (LASIX) 40 MG tablet Take 0.5 tablets (20 mg total) by mouth 2 (two) times daily. 03/13/18  Yes Crissman, Jeannette How, MD  gabapentin (NEURONTIN) 600 MG tablet Take 2 tablets (1,200 mg total) by mouth at bedtime. 01/02/19  Yes Crissman, Jeannette How, MD  glipiZIDE (GLUCOTROL) 5 MG tablet TAKE 1 TABLET BY MOUTH  DAILY BEFORE BREAKFAST 02/26/18  Yes Crissman, Jeannette How, MD  isosorbide mononitrate (IMDUR) 60 MG 24 hr tablet Take 1 tablet (60 mg total) by mouth daily. 01/27/19  Yes Volney American, PA-C  metFORMIN (GLUCOPHAGE) 500 MG tablet Take 2 tablets (1,000 mg total) by mouth 2 (two) times daily. 03/13/18  Yes Crissman, Jeannette How, MD  metoprolol succinate (TOPROL-XL) 25 MG 24 hr tablet Take 1 tablet (25 mg total) by mouth daily.  01/02/19  Yes Crissman, Jeannette How, MD  omeprazole (PRILOSEC) 20 MG capsule Take 1 capsule (20 mg total) by mouth 2 (two) times daily before a meal. 01/02/19  Yes Crissman, Jeannette How, MD  ramipril (ALTACE) 10 MG capsule Take 1 capsule (10 mg total) by mouth daily. 01/02/19  Yes Crissman, Jeannette How, MD  rivaroxaban (XARELTO) 20 MG TABS tablet TAKE 1 TABLET(20 MG) BY MOUTH DAILY WITH SUPPER 01/02/19  Yes Crissman, Jeannette How, MD  albuterol (PROVENTIL HFA;VENTOLIN HFA) 108 (90 Base) MCG/ACT inhaler Inhale 2 puffs into the lungs every 4 (four) hours as needed for wheezing  or shortness of breath. 01/02/19   Guadalupe Maple, MD      VITAL SIGNS:  Blood pressure 139/75, pulse 85, temperature 98.3 F (36.8 C), temperature source Oral, resp. rate 20, height 5\' 10"  (1.778 m), weight 122 kg, SpO2 97 %.  PHYSICAL EXAMINATION:  GENERAL:  68 y.o.-year-old patient lying in the bed with no acute distress.  EYES: Pupils equal, round, reactive to light and accommodation. No scleral icterus. Extraocular muscles intact.  HEENT: Head atraumatic, normocephalic. Oropharynx and nasopharynx clear.  NECK:  Supple, no jugular venous distention. No thyroid enlargement, no tenderness.  LUNGS: Normal breath sounds bilaterally, no wheezing, rales,rhonchi or crepitation. No use of accessory muscles of respiration.  CARDIOVASCULAR: S1, S2 normal. No murmurs, rubs, or gallops.  ABDOMEN: Soft, nontender, nondistended. Bowel sounds present. No organomegaly or mass.  EXTREMITIES: No pedal edema, cyanosis, or clubbing.  NEUROLOGIC: Cranial nerves II through XII are intact. Muscle strength 5/5 in all extremities. Sensation intact. Gait not checked.  PSYCHIATRIC: The patient is alert and oriented x 3.  SKIN: No rash, lesion, or ulcer.   LABORATORY PANEL:   CBC Recent Labs  Lab 03/06/19 1644  WBC 8.8  HGB 15.2  HCT 45.1  PLT 176    ------------------------------------------------------------------------------------------------------------------  Chemistries  Recent Labs  Lab 03/06/19 1644  NA 142  K 3.7  CL 106  CO2 24  GLUCOSE 141*  BUN 19  CREATININE 0.93  CALCIUM 10.1  AST 22  ALT 21  ALKPHOS 52  BILITOT 1.8*   ------------------------------------------------------------------------------------------------------------------  Cardiac Enzymes Recent Labs  Lab 03/06/19 1644  TROPONINI 0.07*   ------------------------------------------------------------------------------------------------------------------  RADIOLOGY:  Dg Chest 2 View  Result Date: 03/06/2019 CLINICAL DATA:  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 Mediastinal contours and pulmonary vascularity normal. Decreased lung volumes with minimal bibasilar atelectasis. Upper lungs clear. No definite infiltrate, pleural effusion or pneumothorax. Bones unremarkable. IMPRESSION: Enlargement of cardiac silhouette. Minimal bibasilar atelectasis. Electronically Signed   By: Lavonia Dana M.D.   On: 03/06/2019 17:26    EKG:   Atrial fibrillation 97 bpm flipped T waves laterally  IMPRESSION AND PLAN:   1.  ACS with chest pain and borderline troponin.  Will get serial troponins monitor on telemetry.  I notified [facility] cardiology on call physician about the patient to see the patient in the morning to decide what test to order either stress test or cardiac catheterization.  Holding Xarelto and placed on heparin drip.  Ordered nitro patch.  Ordered aspirin and metoprolol. 2.  Permanent atrial fibrillation.  Continue metoprolol.  Hold Xarelto and give heparin instead 3.   Acute hypoxic respiratory failure.  Oxygen supplementation.  Chest x-ray negative continue inhalers. 4.  Type 2 diabetes mellitus hold oral medications and put on some sliding scale insulin 5.  Obesity.  Weight loss needed 6.  Hypertension continue usual medications 7.  Hyperlipidemia unspecified continue Lipitor 8.  Early pulmonary fibrosis.  History of COPD.   All the records are reviewed and case discussed with ED provider. Management plans discussed with the patient, and he is in agreement.  CODE STATUS: Full code  TOTAL TIME TAKING CARE OF THIS PATIENT: 50 minutes.    Loletha Grayer M.D on 03/06/2019 at 6:28 PM  Between 7am to 6pm -  Pager - [phone]  After 6pm call admission pager [phone]  [employer] [facility]  [phone]  CC: Primary care physician; Guadalupe Maple, MD

## 2019-03-06 NOTE — ED Triage Notes (Signed)
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 Reports cp continues no relief of pain with nitro spray x2 given to patient by EMS prior to ed arrival. ASA 324mg  given prior to ed Arrival.

## 2019-03-06 NOTE — ED Provider Notes (Signed)
[facility] Emergency Department Provider Note  Time seen: 4:46 PM  I have reviewed the triage vital signs and the nursing notes.   HISTORY  Chief Complaint Chest Pain   HPI Ernest [name] is a 68 y.o. male with a past medical history of asthma, COPD, diabetes, hypertension, hyperlipidemia, CAD, last cath in 2015, no stents, presents to the emergency department for chest pain or shortness of breath.  According to the patient since yesterday he has been experiencing intermittent chest pain or shortness of breath with occasional palpitations.  Patient states he stays in atrial fibrillation at all times is on anticoagulation per patient.  States today while working he would become short of breath and developed mild chest discomfort have to stop working, states once the chest pain shortness of breath relieve the patient could then start working again however would have to stop after a short while.  Currently the patient describes very mild dull discomfort with mild shortness of breath.  He is found to be hypoxic 88% on room air currently.   Past Medical History:  Diagnosis Date  . Asthma   . COPD (chronic obstructive pulmonary disease) ([place])   . Diabetes mellitus    Type II  . Early Pulmonary fibrosis ([place])   . GERD (gastroesophageal reflux disease)   . History of echocardiogram    a. 02/2017 Echo: EF 60-65%, no rwma, mild MR, mod dil LA. Nl RV fxn. PASP 30mmHg.  Marland Kitchen Hyperlipidemia   . Hypertension   . Morbid obesity ([place])   . Non-obstructive CAD (coronary artery disease)    a. 2011 Cath: nonobs dzs; b. 07/2014 Cath: LM 30, LAD nl, LCX nl, RCA 20p, 51m.  Marland Kitchen Permanent atrial fibrillation    a. CHA2DS2VASc = 4-->xarelto.    Patient Active Problem List   Diagnosis Date Noted  . COPD (chronic obstructive pulmonary disease) with emphysema ([place]) 10/10/2018  . Angiodysplasia of stomach and duodenum   . Angiodysplasia of intestinal tract   . Congenital  gastrointestinal vessel anomaly   . Columnar epithelial-lined lower esophagus   . Stomach irritation   . Gastric polyp   . Polyp of sigmoid colon   . Benign neoplasm of transverse colon   . Intestinal lump   . Iron deficiency anemia due to chronic blood loss 04/25/2018  . Advanced care planning/counseling discussion 03/13/2018  . Anemia, unspecified 02/05/2018  . Pericardial calcification 02/07/2017  . Calcification of abdominal aorta (HCC) 02/07/2017  . Cough with hemoptysis 01/30/2017  . GERD (gastroesophageal reflux disease) 11/25/2015  . Restless legs 11/25/2015  . BPH (benign prostatic hyperplasia) 11/25/2015  . Type 2 DM with CKD stage 3 and hypertension ([place]) 07/06/2015  . OSA on CPAP 08/25/2014  . BMI 40.0-44.9, adult ([place]) 08/25/2014  . Essential hypertension 08/11/2014  . Atypical chest pain 09/20/2010  . CAD, NATIVE VESSEL 06/16/2010  . Hyperlipidemia 06/03/2010  . Atrial fibrillation ([place]) 06/03/2010    Past Surgical History:  Procedure Laterality Date  . ANKLE SURGERY    . CARDIAC CATHETERIZATION  05/2010   [facility]  . CARDIAC CATHETERIZATION     [facility]  . C[facility]AC CAT[facility]IZATION     [facility]  . CARDIAC CATHETERIZATION  07/2014   [facility]  . COLONOSCOPY    . COLONOSCOPY WITH PROPOFOL N/A 06/26/2018   Procedure: COLONOSCOPY WITH PROPOFOL;  Surgeon: Virgel Manifold, MD;  Location: [facility] ENDOSCOPY;  Service: Endoscopy;  Laterality: N/A;  . ENTEROSCOPY N/A 08/05/2018   Procedure: ENTEROSCOPY;  Surgeon: [name]  B, MD;  Location: [facility] ENDOSCOPY;  Service: Endoscopy;  Laterality: N/A;  . ESOPHAGOGASTRODUODENOSCOPY (EGD) WITH PROPOFOL N/A 06/26/2018   Procedure: ESOPHAGOGASTRODUODENOSCOPY (EGD) WITH PROPOFOL;  Surgeon: Virgel Manifold, MD;  Location: [facility] ENDOSCOPY;  Service: Endoscopy;  Laterality: N/A;  . TESTICLE SURGERY  70's  . TUMOR EXCISION     Neck and finger; benign  . WRIST SURGERY      Prior to Admission medications   Medication Sig Start Date End  Date Taking? Authorizing Provider  albuterol (PROVENTIL HFA;VENTOLIN HFA) 108 (90 Base) MCG/ACT inhaler Inhale 2 puffs into the lungs every 4 (four) hours as needed for wheezing or shortness of breath. 01/02/19   Guadalupe Maple, MD  atorvastatin (LIPITOR) 40 MG tablet Take 1 tablet (40 mg total) by mouth daily. 01/27/19   Volney American, PA-C  budesonide-formoterol [facility]) 160-4.5 MCG/ACT inhaler Inhale 2 puffs into the lungs 2 (two) times daily. 01/27/19   Volney American, PA-C  canagliflozin [facility]) 300 MG TABS tablet Take 1 tablet (300 mg total) by mouth daily before breakfast. 01/02/19   Guadalupe Maple, MD  diltiazem (CARDIZEM CD) 180 MG 24 hr capsule Take 1 capsule (180 mg total) by mouth daily. 06/14/18 02/04/19  Theora Gianotti, NP  furosemide (LASIX) 40 MG tablet Take 0.5 tablets (20 mg total) by mouth 2 (two) times daily. 03/13/18   Guadalupe Maple, MD  gabapentin (NEURONTIN) 600 MG tablet Take 2 tablets (1,200 mg total) by mouth at bedtime. 01/02/19   Guadalupe Maple, MD  glipiZIDE (GLUCOTROL) 5 MG tablet TAKE 1 TABLET BY MOUTH  DAILY BEFORE BREAKFAST 02/26/18   Guadalupe Maple, MD  isosorbide mononitrate (IMDUR) 60 MG 24 hr tablet Take 1 tablet (60 mg total) by mouth daily. 01/27/19   Volney American, PA-C  metFORMIN (GLUCOPHAGE) 500 MG tablet Take 2 tablets (1,000 mg total) by mouth 2 (two) times daily. 03/13/18   Guadalupe Maple, MD  metoprolol succinate (TOPROL-XL) 25 MG 24 hr tablet Take 1 tablet (25 mg total) by mouth daily. 01/02/19   Guadalupe Maple, MD  omeprazole (PRILOSEC) 20 MG capsule Take 1 capsule (20 mg total) by mouth 2 (two) times daily before a meal. 01/02/19   Crissman, Jeannette How, MD  ramipril (ALTACE) 10 MG capsule Take 1 capsule (10 mg total) by mouth daily. 01/02/19   Guadalupe Maple, MD  rivaroxaban (XARELTO) 20 MG TABS tablet TAKE 1 TABLET(20 MG) BY MOUTH DAILY WITH SUPPER 01/02/19   Guadalupe Maple, MD    Allergies  Allergen  Reactions  . Doxycycline Other (See Comments)    Mouth sores     Family History  Problem Relation Age of Onset  . Hypertension Mother   . Hyperlipidemia Mother   . Diabetes Mother   . Heart disease Mother        CABG  . Pneumonia Father        rare type  . Diabetes Other   . Depression Other   . Coronary artery disease Other   . Alcohol abuse Other   . Hypertension Other   . Hyperlipidemia Other   . Cancer Brother   . Kidney Stones Brother     Social History Social History   Tobacco Use  . Smoking status: Former Smoker    Packs/day: 4.00    Years: 28.00    Pack years: 112.00    Types: Cigarettes    Last attempt to quit: 08/25/1993    Years  since quitting: 25.5  . Smokeless tobacco: Never Used  Substance Use Topics  . Alcohol use: Not Currently    Comment: rarely, occasional beer   . Drug use: No    Review of Systems Constitutional: Negative for fever. Cardiovascular: Intermittent chest pain worse with exertion. Respiratory: Positive for shortness of breath. Gastrointestinal: Negative for abdominal pain, vomiting  Musculoskeletal: Negative for musculoskeletal complaints Skin: Negative for skin complaints  Neurological: Negative for headache All other ROS negative  ____________________________________________   PHYSICAL EXAM:  VITAL SIGNS: ED Triage Vitals  Enc Vitals Group     BP 03/06/19 1633 (!) 156/75     Pulse Rate 03/06/19 1633 93     Resp 03/06/19 1633 20     Temp 03/06/19 1633 98.6 F (37 C)     Temp Source 03/06/19 1633 Oral     SpO2 03/06/19 1633 (!) 88 %     Weight 03/06/19 1637 269 lb (122 kg)     Height 03/06/19 1637 5\' 10"  (1.778 m)     Head Circumference --      Peak Flow --      Pain Score 03/06/19 1636 6     Pain Loc --      Pain Edu? --      Excl. in [place]? --    Constitutional: Alert and oriented. Well appearing and in no distress. Eyes: Normal exam ENT   Head: Normocephalic and atraumatic   Mouth/Throat: Mucous  membranes are moist. Cardiovascular: Normal rate, regular rhythm. No murmur Respiratory: Normal respiratory effort without tachypnea nor retractions. Breath sounds are clear  Gastrointestinal: Soft and nontender. No distention. Musculoskeletal: Nontender with normal range of motion in all extremities.  Neurologic:  Normal speech and language. No gross focal neurologic deficits Skin:  Skin is warm, dry and intact.  Psychiatric: Mood and affect are normal.   ____________________________________________    EKG  EKG viewed and interpreted by myself shows atrial fibrillation at 97 bpm with a narrow QRS, normal axis, normal intervals, nonspecific ST changes without ST elevation.  ____________________________________________    RADIOLOGY  IMPRESSION: Enlargement of cardiac silhouette.  Minimal bibasilar atelectasis.  ____________________________________________   INITIAL IMPRESSION / ASSESSMENT AND PLAN / ED COURSE  Pertinent labs & imaging results that were available during my care of the patient were reviewed by me and considered in my medical decision making (see chart for details).  Patient presents to the emergency department for intermittent chest pain shortness of breath worse with exertion.  Upon arrival patient is 88% on room air with no home O2 requirement.  Currently A. fib however this appears to be chronic for the patient.  Differential would include labile angina, unstable angina, ACS, CHF, pulmonary edema, COPD.  We will check labs including cardiac enzymes, chest x-ray, EKG.  Currently patient appears well, states very minimal shortness of breath at this time.  Patient's troponin is elevated 0.07.  Chest x-ray is clear.  Patient remains hypoxic 88 to 90% on room air, sats in the upper 90s on 2 L.  Given his intermittent chest pain and shortness of breath worse with exertion resolves with rest.  We will admit the patient to the hospital for continued monitoring, cycling of  enzymes and likely cardiology consultation.  ____________________________________________   FINAL CLINICAL IMPRESSION(S) / ED DIAGNOSES  Chest pain Hypoxia Dyspnea   Harvest Dark, MD 03/06/19 1806

## 2019-03-06 NOTE — Consult Note (Signed)
ANTICOAGULATION CONSULT NOTE - Initial Consult  Pharmacy Consult for Heparin Drip Indication: chest pain/ACS  Allergies  Allergen Reactions  . Doxycycline Other (See Comments)    Mouth sores     Patient Measurements: Height: 5\' 10"  (177.8 cm) Weight: 269 lb (122 kg) IBW/kg (Calculated) : 73 Heparin Dosing Weight: 100.5 Kg  Vital Signs: Temp: 98.3 F (36.8 C) (03/19 1740) Temp Source: Oral (03/19 1740) BP: 139/98 (03/19 1800) Pulse Rate: 98 (03/19 1800)  Labs: Recent Labs    03/06/19 1644  HGB 15.2  HCT 45.1  PLT 176  CREATININE 0.93  TROPONINI 0.07*    Estimated Creatinine Clearance: 99.6 mL/min (by C-G formula based on SCr of 0.93 mg/dL).   Medical History: Past Medical History:  Diagnosis Date  . Asthma   . COPD (chronic obstructive pulmonary disease) ([place])   . Diabetes mellitus    Type II  . Early Pulmonary fibrosis ([place])   . GERD (gastroesophageal reflux disease)   . History of echocardiogram    a. 02/2017 Echo: EF 60-65%, no rwma, mild MR, mod dil LA. Nl RV fxn. PASP 39mmHg.  Marland Kitchen Hyperlipidemia   . Hypertension   . Morbid obesity ([place])   . Non-obstructive CAD (coronary artery disease)    a. 2011 Cath: nonobs dzs; b. 07/2014 Cath: LM 30, LAD nl, LCX nl, RCA 20p, 45m.  Marland Kitchen Permanent atrial fibrillation    a. CHA2DS2VASc = 4-->xarelto.    Medications:  Scheduled:  . aspirin EC  81 mg Oral Daily  . atorvastatin  40 mg Oral Daily  . [START ON 03/07/2019] diltiazem  180 mg Oral Daily  . furosemide  20 mg Oral BID  . gabapentin  1,200 mg Oral QHS  . [START ON 03/07/2019] metoprolol succinate  25 mg Oral Daily  . mometasone-formoterol  2 puff Inhalation BID  . nitroGLYCERIN  0.5 inch Topical Q6H  . pantoprazole  40 mg Oral Daily  . [START ON 03/07/2019] ramipril  10 mg Oral Daily    Assessment: Patient has been taking Rivaroxaban 20 mg PO daily PTA. Took last dose on 3/18 @ 2000, therefore will initiate therapy immediately.  HL and aPTT have been ordered  in order to have baseline levels for patient.   Goal of Therapy:  Heparin level 0.3-0.7 units/ml aPTT 66-102 seconds Monitor platelets by anticoagulation protocol: Yes   Plan:  Give 4,000 units bolus x 1 Start heparin infusion at 1,300 units/hr   Will order repeat aPTT on 3/20 @ 0200. Will order HL with AM labs and continue to monitor.  Eleftherios Dudenhoeffer A Lou Irigoyen 03/06/2019,6:33 PM

## 2019-03-06 NOTE — ED Notes (Signed)
ED TO INPATIENT HANDOFF REPORT  ED Nurse Name and Phone #: Laurell Roof Name/Age/Gender Ernest [name] 68 y.o. male Room/Bed: ED12A/ED12A  Code Status   Code Status: Full Code  Home/SNF/Other Home Patient oriented to: self Is this baseline? Yes   Triage Complete: Triage complete  Chief Complaint chest pain ems  Triage Note Patient reports sudden onset of chest pain while cleaning cement of his truck. Reports cp continues no relief of pain with nitro spray x2 given to patient by EMS prior to ed arrival. ASA 324mg  given prior to ed Arrival.    Allergies Allergies  Allergen Reactions  . Doxycycline Other (See Comments)    Mouth sores     Level of Care/Admitting Diagnosis ED Disposition    ED Disposition Condition [facility] Area: [place] [[id number]]  Level of Care: Telemetry [5]  Diagnosis: ACS (acute coronary syndrome) [facility]) [[id number]]  Admitting Physician: Loletha Grayer [[id number]]  Attending Physician: Loletha Grayer [phone]  Estimated length of stay: past midnight tomorrow  Certification:: I certify this patient will need inpatient services for at least 2 midnights  PT Class (Do Not Modify): Inpatient [101]  PT Acc Code (Do Not Modify): Private [1]       B Medical/Surgery History Past Medical History:  Diagnosis Date  . Asthma   . COPD (chronic obstructive pulmonary disease) ([place])   . Diabetes mellitus    Type II  . Early Pulmonary fibrosis ([place])   . GERD (gastroesophageal reflux disease)   . History of echocardiogram    a. 02/2017 Echo: EF 60-65%, no rwma, mild MR, mod dil LA. Nl RV fxn. PASP 3mmHg.  Marland Kitchen Hyperlipidemia   . Hypertension   . Morbid obesity ([place])   . Non-obstructive CAD (coronary artery disease)    a. 2011 Cath: nonobs dzs; b. 07/2014 Cath: LM 30, LAD nl, LCX nl, RCA 20p, 42m.  Marland Kitchen Permanent atrial fibrillation    a. CHA2DS2VASc = 4-->xarelto.   Past Surgical History:  Procedure Laterality Date   . ANKLE SURGERY    . CARDIAC CATHETERIZATION  05/2010   [facility]  . CARDIAC CATHETERIZATION     [facility]  . CARD[facility]AC CATHETERIZATION     [facility]  . CARDIAC CATHETERIZATION  07/2014   [facility]  . COLONOSCOPY    . COLONOSCOPY WITH PROPOFOL N/A 06/26/2018   Procedure: COLONOSCOPY WITH PROPOFOL;  Surgeon: Virgel Manifold, MD;  Location: [facility] ENDOSCOPY;  Service: Endoscopy;  Laterality: N/A;  . ENTEROSCOPY N/A 08/05/2018   Procedure: ENTEROSCOPY;  Surgeon: Virgel Manifold, MD;  Location: [facility] ENDOSCOPY;  Service: Endoscopy;  Laterality: N/A;  . ESOPHAGOGASTRODUODENOSCOPY (EGD) WITH PROPOFOL N/A 06/26/2018   Procedure: ESOPHAGOGASTRODUODENOSCOPY (EGD) WITH PROPOFOL;  Surgeon: Virgel Manifold, MD;  Location: [facility] ENDOSCOPY;  Service: Endoscopy;  Laterality: N/A;  . TESTICLE SURGERY  70's  . TUMOR EXCISION     Neck and finger; benign  . WRIST SURGERY       A IV Location/Drains/Wounds Patient Lines/Drains/Airways Status   Active Line/Drains/Airways    Name:   Placement date:   Placement time:   Site:   Days:   Peripheral IV 03/06/19 Right Forearm   03/06/19    1630    Forearm   less than 1          Intake/Output Last 24 hours No intake or output data in the 24 hours ending 03/06/19 1923  Labs/Imaging Results for orders placed or performed during the hospital encounter  of 03/06/19 (from the past 48 hour(s))  CBC     Status: None   Collection Time: 03/06/19  4:44 PM  Result Value Ref Range   WBC 8.8 4.0 - 10.5 K/uL   RBC 5.40 4.22 - 5.81 MIL/uL   Hemoglobin 15.2 13.0 - 17.0 g/dL   HCT 45.1 39.0 - 52.0 %   MCV 83.5 80.0 - 100.0 fL   MCH 28.1 26.0 - 34.0 pg   MCHC 33.7 30.0 - 36.0 g/dL   RDW 14.0 11.5 - 15.5 %   Platelets 176 150 - 400 K/uL   nRBC 0.0 0.0 - 0.2 %    Comment: Performed at [facility], [address] [place], [place] [address]  Comprehensive metabolic panel     Status: Abnormal   Collection Time: 03/06/19  4:44 PM  Result Value Ref Range   Sodium 142  135 - 145 mmol/L   Potassium 3.7 3.5 - 5.1 mmol/L   Chloride 106 98 - 111 mmol/L   CO2 24 22 - 32 mmol/L   Glucose, Bld 141 (H) 70 - 99 mg/dL   BUN 19 8 - 23 mg/dL   Creatinine, Ser 0.93 0.61 - 1.24 mg/dL   Calcium 10.1 8.9 - 10.3 mg/dL   Total Protein 7.6 6.5 - 8.1 g/dL   Albumin 4.7 3.5 - 5.0 g/dL   AST 22 15 - 41 U/L   ALT 21 0 - 44 U/L   Alkaline Phosphatase 52 38 - 126 U/L   Total Bilirubin 1.8 (H) 0.3 - 1.2 mg/dL   GFR calc non Af Amer >60 >60 mL/min   GFR calc Af Amer >60 >60 mL/min   Anion gap 12 5 - 15    Comment: Performed at [facility], [address] [place], [place] [address]  Troponin I - ONCE - STAT     Status: Abnormal   Collection Time: 03/06/19  4:44 PM  Result Value Ref Range   Troponin I 0.07 (HH) <0.03 ng/mL    Comment: CRITICAL RESULT CALLED TO, READ BACK BY AND VERIFIED WITH LORRIE LEMONS @ 1733 ON 03/06/19 BY JUW Performed at [facility], [address] [place], [place] [address]   APTT     Status: None   Collection Time: 03/06/19  6:44 PM  Result Value Ref Range   aPTT 33 24 - 36 seconds    Comment: Performed at [facility], [address] [place], [name] [address]  Heparin level (unfractionated)     Status: None   Collection Time: 03/06/19  6:44 PM  Result Value Ref Range   Heparin Unfractionated 0.56 0.30 - 0.70 IU/mL    Comment: (NOTE) If heparin results are below expected values, and patient dosage has  been confirmed, suggest follow up testing of antithrombin III levels. Performed at [facility], [address] [place], [place] [address]    Dg Chest 2 View  Result Date: 03/06/2019 CLINICAL DATA:  Onset of chest pain while cleaning cement truck, no relief with nitroglycerin spur Glennon Hamilton, history asthma, COPD, type II diabetes mellitus, hypertension, early pulmonary fibrosis, permanent atrial fibrillation EXAM: CHEST - 2 VIEW COMPARISON:  07/12/2016 Correlation: CT chest 03/05/2018 FINDINGS:  Enlargement of cardiac silhouette. Mediastinal contours and pulmonary vascularity normal. Decreased lung volumes with minimal bibasilar atelectasis. Upper lungs clear. No definite infiltrate, pleural effusion or pneumothorax. Bones unremarkable. IMPRESSION: Enlargement of cardiac silhouette. Minimal bibasilar atelectasis. Electronically Signed   By: Lavonia Dana M.D.   On:  03/06/2019 17:26    Pending Labs Unresulted Labs (From admission, onward)    Start     Ordered   03/07/19 5643  Basic metabolic panel  Tomorrow morning,   STAT     03/06/19 1825   03/07/19 0500  CBC  Tomorrow morning,   STAT     03/06/19 1825   03/07/19 0500  Heparin level (unfractionated)  Tomorrow morning,   STAT     03/06/19 1848   03/07/19 0200  APTT  Once-Timed,   STAT     03/06/19 1848   03/06/19 1826  HIV antibody (Routine Testing)  Add-on,   AD     03/06/19 1825          Vitals/Pain Today's Vitals   03/06/19 1740 03/06/19 1800 03/06/19 1830 03/06/19 1832  BP: 139/75 (!) 139/98 (!) 140/91   Pulse: 85 98 89   Resp: 20 (!) 23 12   Temp: 98.3 F (36.8 C)     TempSrc: Oral     SpO2: 97% 97% 97%   Weight:      Height:      PainSc: 0-No pain   2     Isolation Precautions No active isolations  Medications Medications  acetaminophen (TYLENOL) tablet 650 mg (has no administration in time range)    Or  acetaminophen (TYLENOL) suppository 650 mg (has no administration in time range)  ondansetron (ZOFRAN) tablet 4 mg (has no administration in time range)    Or  ondansetron (ZOFRAN) injection 4 mg (has no administration in time range)  aspirin EC tablet 81 mg (has no administration in time range)  atorvastatin (LIPITOR) tablet 40 mg (has no administration in time range)  diltiazem (CARDIZEM CD) 24 hr capsule 180 mg (has no administration in time range)  furosemide (LASIX) tablet 20 mg (has no administration in time range)  metoprolol succinate (TOPROL-XL) 24 hr tablet 25 mg (has no administration in time  range)  ramipril (ALTACE) capsule 10 mg (has no administration in time range)  nitroGLYCERIN (NITROGLYN) 2 % ointment 0.5 inch (has no administration in time range)  pantoprazole (PROTONIX) EC tablet 40 mg (has no administration in time range)  gabapentin (NEURONTIN) tablet 1,200 mg (has no administration in time range)  albuterol (PROVENTIL) (2.5 MG/3ML) 0.083% nebulizer solution 3 mL (has no administration in time range)  mometasone-formoterol (DULERA) 200-5 MCG/ACT inhaler 2 puff (has no administration in time range)  heparin bolus via infusion 4,000 Units (has no administration in time range)  heparin ADULT infusion 100 units/mL (25000 units/216mL sodium chloride 0.45%) (has no administration in time range)  insulin aspart (novoLOG) injection 0-9 Units (has no administration in time range)  insulin aspart (novoLOG) injection 0-5 Units (has no administration in time range)    Mobility walks Low fall risk   Focused Assessments Cardiac Assessment Handoff:    Lab Results  Component Value Date   CKTOTAL 96 07/24/2014   CKMB 1.2 07/24/2014   TROPONINI 0.07 (HH) 03/06/2019   No results found for: DDIMER Does the Patient currently have chest pain? No     R Recommendations: See Admitting Provider Note  Report given to: Levada Dy   Additional Notes:

## 2019-03-07 ENCOUNTER — Encounter: Payer: Self-pay | Admitting: Pulmonary Disease

## 2019-03-07 ENCOUNTER — Inpatient Hospital Stay (HOSPITAL_COMMUNITY)
Admit: 2019-03-07 | Discharge: 2019-03-07 | Disposition: A | Discharge status: Home / Self Care | Payer: Medicare Other | Attending: Internal Medicine | Admitting: Internal Medicine

## 2019-03-07 ENCOUNTER — Encounter
Admission: EM | Disposition: A | Discharge status: Home / Self Care | Payer: Self-pay | Source: Home / Self Care | Attending: Internal Medicine

## 2019-03-07 DIAGNOSIS — R079 Chest pain, unspecified: Secondary | ICD-10-CM

## 2019-03-07 DIAGNOSIS — I2510 Atherosclerotic heart disease of native coronary artery without angina pectoris: Secondary | ICD-10-CM

## 2019-03-07 DIAGNOSIS — I4821 Permanent atrial fibrillation: Secondary | ICD-10-CM

## 2019-03-07 DIAGNOSIS — I214 Non-ST elevation (NSTEMI) myocardial infarction: Secondary | ICD-10-CM

## 2019-03-07 DIAGNOSIS — E785 Hyperlipidemia, unspecified: Secondary | ICD-10-CM

## 2019-03-07 HISTORY — PX: CORONARY STENT INTERVENTION: CATH118234

## 2019-03-07 HISTORY — PX: LEFT HEART CATH AND CORONARY ANGIOGRAPHY: CATH118249

## 2019-03-07 LAB — BASIC METABOLIC PANEL
Anion gap: 9 (ref 5–15)
BUN: 19 mg/dL (ref 8–23)
CO2: 26 mmol/L (ref 22–32)
Calcium: 9.4 mg/dL (ref 8.9–10.3)
Chloride: 108 mmol/L (ref 98–111)
Creatinine, Ser: 0.87 mg/dL (ref 0.61–1.24)
GFR calc Af Amer: >60 mL/min (ref >60)
GFR calc non Af Amer: >60 mL/min (ref >60)
Glucose, Bld: 114 mg/dL — ABNORMAL HIGH (ref 70–99)
Potassium: 3.7 mmol/L (ref 3.5–5.1)
Sodium: 143 mmol/L (ref 135–145)

## 2019-03-07 LAB — APTT
aPTT: 55 seconds — ABNORMAL HIGH (ref 24–36)
aPTT: 68 seconds — ABNORMAL HIGH (ref 24–36)

## 2019-03-07 LAB — POCT ACTIVATED CLOTTING TIME
Activated Clotting Time: 296 seconds
Activated Clotting Time: 301 seconds

## 2019-03-07 LAB — GLUCOSE, CAPILLARY
Glucose-Capillary: 123 mg/dL — ABNORMAL HIGH (ref 70–99)
Glucose-Capillary: 124 mg/dL — ABNORMAL HIGH (ref 70–99)
Glucose-Capillary: 157 mg/dL — ABNORMAL HIGH (ref 70–99)
Glucose-Capillary: 166 mg/dL — ABNORMAL HIGH (ref 70–99)

## 2019-03-07 LAB — CBC
HCT: 46.1 % (ref 39.0–52.0)
Hemoglobin: 15.1 g/dL (ref 13.0–17.0)
MCH: 28.1 pg (ref 26.0–34.0)
MCHC: 32.8 g/dL (ref 30.0–36.0)
MCV: 85.8 fL (ref 80.0–100.0)
Platelets: 175 10*3/uL (ref 150–400)
RBC: 5.37 MIL/uL (ref 4.22–5.81)
RDW: 14 % (ref 11.5–15.5)
WBC: 8.8 10*3/uL (ref 4.0–10.5)
nRBC: 0 % (ref 0.0–0.2)

## 2019-03-07 LAB — TROPONIN I
Troponin I: 0.37 ng/mL (ref <0.03)
Troponin I: 0.23 ng/mL (ref <0.03)
Troponin I: 0.22 ng/mL (ref <0.03)

## 2019-03-07 LAB — HEPARIN LEVEL (UNFRACTIONATED): Heparin Unfractionated: 0.28 IU/mL — ABNORMAL LOW (ref 0.30–0.70)

## 2019-03-07 LAB — ECHOCARDIOGRAM COMPLETE
Height: 70 in
Weight: 3936 oz

## 2019-03-07 SURGERY — LEFT HEART CATH AND CORONARY ANGIOGRAPHY
Anesthesia: Moderate Sedation

## 2019-03-07 MED ORDER — NITROGLYCERIN 1 MG/10 ML FOR IR/CATH LAB
INTRA_ARTERIAL | Status: DC | PRN
  Administered 2019-03-07 (×2): 200 ug via INTRACORONARY

## 2019-03-07 MED ORDER — VERAPAMIL HCL 2.5 MG/ML IV SOLN
INTRAVENOUS | Status: AC
  Filled 2019-03-07: qty 2

## 2019-03-07 MED ORDER — HEPARIN SODIUM (PORCINE) 1000 UNIT/ML IJ SOLN
INTRAMUSCULAR | Status: AC
  Filled 2019-03-07: qty 1

## 2019-03-07 MED ORDER — SODIUM CHLORIDE 0.9 % WEIGHT BASED INFUSION
3.0000 mL/kg/h | INTRAVENOUS | Status: DC
  Administered 2019-03-07: 3 mL/kg/h via INTRAVENOUS

## 2019-03-07 MED ORDER — ADULT MULTIVITAMIN W/MINERALS CH
1.0000 | ORAL_TABLET | Freq: Every day | ORAL | Status: DC
  Administered 2019-03-07 – 2019-03-08 (×2): 1 via ORAL
  Filled 2019-03-07 (×2): qty 1

## 2019-03-07 MED ORDER — SODIUM CHLORIDE 0.9 % WEIGHT BASED INFUSION: 1.0000 mL/kg/h | INTRAVENOUS | Status: DC

## 2019-03-07 MED ORDER — HEPARIN (PORCINE) IN NACL 1000-0.9 UT/500ML-% IV SOLN
INTRAVENOUS | Status: AC
  Filled 2019-03-07: qty 1000

## 2019-03-07 MED ORDER — NITROGLYCERIN 5 MG/ML IV SOLN
INTRAVENOUS | Status: AC
  Filled 2019-03-07: qty 10

## 2019-03-07 MED ORDER — SODIUM CHLORIDE 0.9 % IV SOLN: INTRAVENOUS | Status: AC

## 2019-03-07 MED ORDER — MIDAZOLAM HCL 2 MG/2ML IJ SOLN
INTRAMUSCULAR | Status: AC
  Filled 2019-03-07: qty 2

## 2019-03-07 MED ORDER — SODIUM CHLORIDE 0.9% FLUSH: 3.0000 mL | INTRAVENOUS | Status: DC | PRN

## 2019-03-07 MED ORDER — ASPIRIN 81 MG PO CHEW
CHEWABLE_TABLET | ORAL | Status: AC
  Filled 2019-03-07: qty 1

## 2019-03-07 MED ORDER — ASPIRIN 81 MG PO CHEW
CHEWABLE_TABLET | ORAL | Status: AC
  Filled 2019-03-07: qty 3

## 2019-03-07 MED ORDER — HYDRALAZINE HCL 20 MG/ML IJ SOLN: 5.0000 mg | INTRAMUSCULAR | Status: AC | PRN

## 2019-03-07 MED ORDER — LABETALOL HCL 5 MG/ML IV SOLN: 10.0000 mg | INTRAVENOUS | Status: AC | PRN

## 2019-03-07 MED ORDER — FAMOTIDINE 20 MG PO TABS
ORAL_TABLET | ORAL | Status: AC
  Administered 2019-03-07: 16:00:00
  Filled 2019-03-07: qty 1

## 2019-03-07 MED ORDER — SODIUM CHLORIDE 0.9 % IV SOLN: 250.0000 mL | INTRAVENOUS | Status: DC | PRN

## 2019-03-07 MED ORDER — CLOPIDOGREL BISULFATE 75 MG PO TABS
ORAL_TABLET | ORAL | Status: DC | PRN
  Administered 2019-03-07: 600 mg via ORAL

## 2019-03-07 MED ORDER — FENTANYL CITRATE (PF) 100 MCG/2ML IJ SOLN
INTRAMUSCULAR | Status: DC | PRN
  Administered 2019-03-07: 25 ug via INTRAVENOUS

## 2019-03-07 MED ORDER — CLOPIDOGREL BISULFATE 75 MG PO TABS
75.0000 mg | ORAL_TABLET | Freq: Every day | ORAL | Status: DC
  Administered 2019-03-08: 75 mg via ORAL
  Filled 2019-03-07: qty 1

## 2019-03-07 MED ORDER — MIDAZOLAM HCL 2 MG/2ML IJ SOLN
INTRAMUSCULAR | Status: DC | PRN
  Administered 2019-03-07: 1 mg via INTRAVENOUS

## 2019-03-07 MED ORDER — ASPIRIN 81 MG PO CHEW
CHEWABLE_TABLET | ORAL | Status: DC | PRN
  Administered 2019-03-07: 243 mg via ORAL

## 2019-03-07 MED ORDER — SODIUM CHLORIDE 0.9% FLUSH: 3.0000 mL | Freq: Two times a day (BID) | INTRAVENOUS | Status: DC

## 2019-03-07 MED ORDER — SODIUM CHLORIDE 0.9% FLUSH
3.0000 mL | Freq: Two times a day (BID) | INTRAVENOUS | Status: DC
  Administered 2019-03-07 – 2019-03-08 (×3): 3 mL via INTRAVENOUS

## 2019-03-07 MED ORDER — IOPAMIDOL (ISOVUE-300) INJECTION 61%
INTRAVENOUS | Status: DC | PRN
  Administered 2019-03-07: 160 mL via INTRA_ARTERIAL

## 2019-03-07 MED ORDER — HEPARIN (PORCINE) 25000 UT/250ML-% IV SOLN
1550.0000 [IU]/h | INTRAVENOUS | Status: DC
  Administered 2019-03-07: 1450 [IU]/h via INTRAVENOUS
  Administered 2019-03-08: 1550 [IU]/h via INTRAVENOUS
  Filled 2019-03-07 (×2): qty 250

## 2019-03-07 MED ORDER — HEPARIN BOLUS VIA INFUSION
1400.0000 [IU] | Freq: Once | INTRAVENOUS | Status: AC
  Administered 2019-03-07: 1400 [IU] via INTRAVENOUS
  Filled 2019-03-07: qty 1400

## 2019-03-07 MED ORDER — CLOPIDOGREL BISULFATE 300 MG PO TABS
ORAL_TABLET | ORAL | Status: AC
  Filled 2019-03-07: qty 2

## 2019-03-07 MED ORDER — HEPARIN SODIUM (PORCINE) 1000 UNIT/ML IJ SOLN
INTRAMUSCULAR | Status: DC | PRN
  Administered 2019-03-07: 2000 [IU] via INTRAVENOUS
  Administered 2019-03-07: 5000 [IU] via INTRAVENOUS
  Administered 2019-03-07: 6000 [IU] via INTRAVENOUS

## 2019-03-07 MED ORDER — ATORVASTATIN CALCIUM 80 MG PO TABS
80.0000 mg | ORAL_TABLET | Freq: Every day | ORAL | Status: DC
  Administered 2019-03-08: 80 mg via ORAL
  Filled 2019-03-07: qty 4
  Filled 2019-03-07 (×2): qty 1

## 2019-03-07 MED ORDER — FENTANYL CITRATE (PF) 100 MCG/2ML IJ SOLN
INTRAMUSCULAR | Status: AC
  Filled 2019-03-07: qty 2

## 2019-03-07 SURGICAL SUPPLY — 15 items
BALLN MINITREK RX 2.0X12 (BALLOONS) ×2
BALLN ~~LOC~~ TREK RX 2.75X15 (BALLOONS) ×2
BALLOON MINITREK RX 2.0X12 (BALLOONS) ×1 IMPLANT
BALLOON ~~LOC~~ TREK RX 2.75X15 (BALLOONS) ×1 IMPLANT
CATH 5F 110X4 TIG (CATHETERS) ×2 IMPLANT
CATH INFINITI 5FR ANG PIGTAIL (CATHETERS) ×2 IMPLANT
CATH LAUNCHER 6FR EBU 3 (CATHETERS) ×2 IMPLANT
DEVICE INFLAT 30 PLUS (MISCELLANEOUS) ×2 IMPLANT
DEVICE RAD TR BAND REGULAR (VASCULAR PRODUCTS) ×2 IMPLANT
GLIDESHEATH SLEND SS 6F .021 (SHEATH) ×2 IMPLANT
KIT MANI 3VAL PERCEP (MISCELLANEOUS) ×2 IMPLANT
PACK CARDIAC CATH (CUSTOM PROCEDURE TRAY) ×2 IMPLANT
STENT SYNERGY DES 2.5X24 (Permanent Stent) ×2 IMPLANT
WIRE ROSEN-J .035X260CM (WIRE) ×2 IMPLANT
WIRE RUNTHROUGH .014X180CM (WIRE) ×2 IMPLANT

## 2019-03-07 NOTE — Consult Note (Signed)
ANTICOAGULATION CONSULT NOTE - Initial Consult  Pharmacy Consult for Heparin Drip Indication: chest pain/ACS  Allergies  Allergen Reactions  . Doxycycline Other (See Comments)    Mouth sores     Patient Measurements: Height: 5\' 10"  (177.8 cm) Weight: 246 lb 6.4 oz (111.8 kg) IBW/kg (Calculated) : 73 Heparin Dosing Weight: 97 Kg  Vital Signs: Temp: 97.7 F (36.5 C) (03/19 2031) Temp Source: Oral (03/19 2031) BP: 151/83 (03/19 2031) Pulse Rate: 82 (03/19 2031)  Labs: Recent Labs    03/06/19 1644 03/06/19 1844 03/07/19 0211  HGB 15.2  --  15.1  HCT 45.1  --  46.1  PLT 176  --  175  APTT  --  33 55*  HEPARINUNFRC  --  0.56 0.28*  CREATININE 0.93  --  0.87  TROPONINI 0.07*  --   --     Estimated Creatinine Clearance: 101.7 mL/min (by C-G formula based on SCr of 0.87 mg/dL).   Medical History: Past Medical History:  Diagnosis Date  . Asthma   . COPD (chronic obstructive pulmonary disease) ([place])   . Diabetes mellitus    Type II  . Early Pulmonary fibrosis ([place])   . GERD (gastroesophageal reflux disease)   . History of echocardiogram    a. 02/2017 Echo: EF 60-65%, no rwma, mild MR, mod dil LA. Nl RV fxn. PASP 53mmHg.  Marland Kitchen Hyperlipidemia   . Hypertension   . Morbid obesity ([place])   . Non-obstructive CAD (coronary artery disease)    a. 2011 Cath: nonobs dzs; b. 07/2014 Cath: LM 30, LAD nl, LCX nl, RCA 20p, 85m.  Marland Kitchen Permanent atrial fibrillation    a. CHA2DS2VASc = 4-->xarelto.    Medications:  Scheduled:  . aspirin EC  81 mg Oral Daily  . atorvastatin  40 mg Oral Daily  . diltiazem  180 mg Oral Daily  . furosemide  20 mg Oral BID  . gabapentin  1,200 mg Oral QHS  . insulin aspart  0-5 Units Subcutaneous QHS  . insulin aspart  0-9 Units Subcutaneous TID WC  . metoprolol succinate  25 mg Oral Daily  . mometasone-formoterol  2 puff Inhalation BID  . nitroGLYCERIN  0.5 inch Topical Q6H  . pantoprazole  40 mg Oral Daily  . ramipril  10 mg Oral Daily     Assessment: Patient has been taking Rivaroxaban 20 mg PO daily PTA. Took last dose on 3/18 @ 2000, therefore will initiate therapy immediately.  3/20 Heparin infusion started @ 1300 units/hr 3/20  @ 0211 aPTT 55, HL 0.28. both levels subtherapeutic   Goal of Therapy:  Heparin level 0.3-0.7 units/ml aPTT 66-102 seconds Monitor platelets by anticoagulation protocol: Yes   Plan:  3/20  @ 0211 aPTT 55, HL 0.28.  Both levels subtherapeutic. Will order 1400 unit heparin bolus and increase heparin infusion to 1450 units/hr.   Will order aPTT level for 6 hours after infusion increase.   Will order HL and CBC  AM labs daily per protocol.  Pernell Dupre, PharmD, BCPS Clinical Pharmacist 03/07/2019 3:21 AM

## 2019-03-07 NOTE — H&P (View-Only) (Signed)
Cardiology Consult    Patient ID: Loxley Schmale MRN: [id number], DOB/AGE: 69-17-1952   Admit date: 03/06/2019 Date of Consult: 03/07/2019  Primary Physician: Guadalupe Maple, MD Primary Cardiologist: Ida Rogue, MD Requesting Provider: V. Manuella Ghazi, MD  Patient Profile    Ernest [name] is a 68 y.o. male with a history of permanent afib on xarelto, obesity, DMII, HTN, HL, GERD, and COPD, who is being seen today for the evaluation of c/p and NSTEMI at the request of Dr. Manuella Ghazi.  Past Medical History   Past Medical History:  Diagnosis Date  . Asthma   . COPD (chronic obstructive pulmonary disease) ([place])   . Diabetes mellitus    Type II  . Early Pulmonary fibrosis ([place])   . GERD (gastroesophageal reflux disease)   . History of echocardiogram    a. 02/2017 Echo: EF 60-65%, no rwma, mild MR, mod dil LA. Nl RV fxn. PASP 27mmHg.  Marland Kitchen Hyperlipidemia   . Hypertension   . Morbid obesity ([place])   . Non-obstructive CAD (coronary artery disease)    a. 2011 Cath: nonobs dzs; b. 07/2014 Cath: LM 30, LAD nl, LCX nl, RCA 20p, 85m.  Marland Kitchen Permanent atrial fibrillation    a. CHA2DS2VASc = 4-->xarelto.    Past Surgical History:  Procedure Laterality Date  . ANKLE SURGERY    . CARDIAC CATHETERIZATION  05/2010   [facility]  . CARD[facility]AC CATHETERIZATION     [facility]  . C[facility] CAT[facility]IZATION     [facility]  . CARDIAC CATHETERIZATION  07/2014   [facility]  . COLONOSCOPY    . COLONOSCOPY WITH PROPOFOL N/A 06/26/2018   Procedure: COLONOSCOPY WITH PROPOFOL;  Surgeon: Virgel Manifold, MD;  Location: [facility] ENDOSCOPY;  Service: Endoscopy;  Laterality: N/A;  . ENTEROSCOPY N/A 08/05/2018   Procedure: ENTEROSCOPY;  Surgeon: Virgel Manifold, MD;  Location: [facility]COPY;  Service: Endoscopy;  Laterality: N/A;  . ESOPHAGOGASTRODUODENOSCOPY (EGD) WITH PROPOFOL N/A 06/26/2018   Procedure: ESOPHAGOGASTRODUODENOSCOPY (EGD) WITH PROPOFOL;  Surgeon: Virgel Manifold, MD;  Location: [facility] ENDOSCOPY;  Service: Endoscopy;   Laterality: N/A;  . TESTICLE SURGERY  70's  . TUMOR EXCISION     Neck and finger; benign  . WRIST SURGERY       Allergies  Allergies  Allergen Reactions  . Doxycycline Other (See Comments)    Mouth sores     History of Present Illness    68 y/o ? with the above PMH including permanent afib, HTN, HL, DMII, obesity, GERD, and COPD.  He was also prev evaluated in 2015 in the setting of c/p, at which time diagnostic cath showed nonobstructive coronary dzs.  He was last seen in cardiology clinic over the summer of 2019, @ which time he was doing well.  Since then, he says that he has been working part-time - manual labor - and has been more active overall.  He has lost some weight.  His activity tolerance had been pretty good, but on 3/19, he was @ work and developed 10/10 exertional sscp radiating down his left arm and assoc w/ dyspnea.  He rested w/o improvement and then would try to work some more, but was not able to due to severe c/p.  He eventually presented to the ED where ECG was non-acute.  Initial trop was mildly elevated @ 0.07.  He was placed on NTP with some relief of discomfort, along with heparin.  He was admitted for further eval and cont to report 5/10 c/p this AM.  Trop  0.37 this AM.  He is NPO. Last dose of xarelto was the evening of 3/18.  Inpatient Medications    . aspirin EC  81 mg Oral Daily  . atorvastatin  40 mg Oral Daily  . diltiazem  180 mg Oral Daily  . furosemide  20 mg Oral BID  . gabapentin  1,200 mg Oral QHS  . insulin aspart  0-5 Units Subcutaneous QHS  . insulin aspart  0-9 Units Subcutaneous TID WC  . metoprolol succinate  25 mg Oral Daily  . mometasone-formoterol  2 puff Inhalation BID  . nitroGLYCERIN  0.5 inch Topical Q6H  . pantoprazole  40 mg Oral Daily  . ramipril  10 mg Oral Daily    Family History    Family History  Problem Relation Age of Onset  . Hypertension Mother   . Hyperlipidemia Mother   . Diabetes Mother   . Heart disease  Mother        CABG  . Pneumonia Father        rare type  . Diabetes Other   . Depression Other   . Coronary artery disease Other   . Alcohol abuse Other   . Hypertension Other   . Hyperlipidemia Other   . Cancer Brother   . Kidney Stones Brother    He indicated that his mother is deceased. He indicated that his father is deceased. He indicated that both of his sisters are alive. He indicated that two of his three brothers are alive. He indicated that the status of his other is unknown.   Social History    Social History   Socioeconomic History  . Marital status: Widowed    Spouse name: Not on file  . Number of children: Not on file  . Years of education: Not on file  . Highest education level: 8th grade  Occupational History  . Occupation: part time   Social Needs  . Financial resource strain: Not hard at all  . Food insecurity:    Worry: Never true    Inability: Never true  . Transportation needs:    Medical: No    Non-medical: No  Tobacco Use  . Smoking status: Former Smoker    Packs/day: 4.00    Years: 28.00    Pack years: 112.00    Types: Cigarettes    Last attempt to quit: 08/25/1993    Years since quitting: 25.5  . Smokeless tobacco: Never Used  Substance and Sexual Activity  . Alcohol use: Not Currently    Comment: rarely, occasional beer   . Drug use: No  . Sexual activity: Not on file  Lifestyle  . Physical activity:    Days per week: 0 days    Minutes per session: 0 min  . Stress: Not at all  Relationships  . Social connections:    Talks on phone: More than three times a week    Gets together: More than three times a week    Attends religious service: Never    Active member of club or organization: No    Attends meetings of clubs or organizations: Never    Relationship status: Widowed  . Intimate partner violence:    Fear of current or ex partner: No    Emotionally abused: No    Physically abused: No    Forced sexual activity: No  Other  Topics Concern  . Not on file  Social History Narrative   No regular exercise.     Review of  Systems    General:  No chills, fever, night sweats or weight changes.  Cardiovascular:  +++ chest pain, +++ dyspnea on exertion, no edema, orthopnea, palpitations, paroxysmal nocturnal dyspnea. Dermatological: No rash, lesions/masses Respiratory: No cough, +++ dyspnea Urologic: No hematuria, dysuria Abdominal:   No nausea, vomiting, diarrhea, bright red blood per rectum, melena, or hematemesis Neurologic:  No visual changes, wkns, changes in mental status. All other systems reviewed and are otherwise negative except as noted above.  Physical Exam    Blood pressure 138/89, pulse 81, temperature 97.7 F (36.5 C), temperature source Oral, resp. rate 18, height 5\' 10"  (1.778 m), weight 111.8 kg, SpO2 98 %.  General: Pleasant, NAD Psych: Normal affect. Neuro: Alert and oriented X 3. Moves all extremities spontaneously. HEENT: Normal  Neck: Supple without bruits or JVD. Lungs:  Resp regular and unlabored, CTA. Heart: RRR no s3, s4, or murmurs. Abdomen: Obese, Soft, non-tender, non-distended, BS + x 4.  Extremities: No clubbing, cyanosis or edema. DP/PT/Radials 2+ and equal bilaterally.  Labs    Troponin  Recent Labs    03/06/19 1644 03/07/19 0426  TROPONINI 0.07* 0.37*   Lab Results  Component Value Date   WBC 8.8 03/07/2019   HGB 15.1 03/07/2019   HCT 46.1 03/07/2019   MCV 85.8 03/07/2019   PLT 175 03/07/2019    Recent Labs  Lab 03/06/19 1644 03/07/19 0211  NA 142 143  K 3.7 3.7  CL 106 108  CO2 24 26  BUN 19 19  CREATININE 0.93 0.87  CALCIUM 10.1 9.4  PROT 7.6  --   BILITOT 1.8*  --   ALKPHOS 52  --   ALT 21  --   AST 22  --   GLUCOSE 141* 114*   Lab Results  Component Value Date   CHOL 209 (H) 01/27/2019   HDL 30 (L) 01/27/2019   LDLCALC 111 (H) 01/27/2019   TRIG 340 (H) 01/27/2019    Radiology Studies    Dg Chest 2 View  Result Date: 03/06/2019  CLINICAL DATA:  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 Mediastinal contours and pulmonary vascularity normal. Decreased lung volumes with minimal bibasilar atelectasis. Upper lungs clear. No definite infiltrate, pleural effusion or pneumothorax. Bones unremarkable. IMPRESSION: Enlargement of cardiac silhouette. Minimal bibasilar atelectasis. Electronically Signed   By: Lavonia Dana M.D.   On: 03/06/2019 17:26    ECG & Cardiac Imaging    Afib, 97, no acute st/t changes - personally reviewed.  Assessment & Plan    1. NSTEMI/CAD:  Pt admitted with 1 day h/o rest and exertional c/p radiating down his L arm and assoc w/ dyspnea.  Trop 0.37 this AM and he cont to have 5/10 c/p.  Hemodynamically stable.  Last dose of xarelto was the evening of 3/18.  We will pursue diagnostic catheterization this AM.  The patient understands that risks include but are not limited to stroke (1 in 1000), death (1 in 1000), kidney failure [usually temporary] (1 in 500), bleeding (1 in 200), allergic reaction [possibly serious] (1 in 200), and agrees to proceed.  Cont asa,  blocker, acei, and statin.  Pending potential need for PCI, may consider reducing xarelto dose in the setting of P2Y12 inhibitor rx (PIONEER AF-PCI).  2.  Persistent  Afib:  Rate controlled on  blocker and dilt.  Xarelto on hold.  3.  Essential HTN:  Stable.  4.  HL:  Escalate lipitor dosing in setting of ACS. LDL 111 in Feb.  5.  DMII:  A1c 8.8 in Feb.  Per IM.  Cont acei/statin.  6. 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  These were non-bleeding and cauterized.  Will have to watch for bleeding on xarelto + p2y12i (if initiated).   Signed, Murray Hodgkins, NP 03/07/2019, 8:07 AM  For questions or updates, please contact   Please consult [email or web address] for contact info under Cardiology/STEMI.

## 2019-03-07 NOTE — Brief Op Note (Signed)
BRIEF CARDIAC CATHETERIZATION NOTE  03/07/2019  11:08 AM  PATIENT:  Ernest [name]  68 y.o. male  PRE-OPERATIVE DIAGNOSIS:  NSTEMI  POST-OPERATIVE DIAGNOSIS:  NSTEMI  PROCEDURE:  Procedure(s): LEFT HEART CATH AND CORONARY ANGIOGRAPHY (N/A) CORONARY STENT INTERVENTION (N/A)  SURGEON:  Surgeon(s) and Role:    * Chelcey Caputo, Harrell Gave, MD - Primary  FINDINGS: 1. Severe single-vessel CAD with 80% mid LAD stenosis. 2. Non-obstructive LMCA, LCx, and RCA disease. 3. Upper normal LVEDP with normal LVEF. 4. Successful PCI to mid LAD using Synergy 2.5 x 24 mm drug-eluting stent (post-dilated to 2.8 mm) with 0% residual stenosis and TIMI-3 flow.  RECOMMENDATIONS: 1. Overnight monitoring. 2. Obtain echo to better evaluate MR noted on LV-gram. 3. Restart heparin infusion 2 hr's after TR band removal. 4. If no evidence of bleeding/vascular injury, recommend restarting rivaroxaban tomorrow and continuing rivaroxaban 20 mg daily and clopidogrel 75 mg daily x 12 months. 5. Aggressive secondary prevention.  Nelva Bush, MD [facility] Pager: [phone]

## 2019-03-07 NOTE — Consult Note (Signed)
[facility] for Heparin Drip Indication: chest pain/ACS  Allergies  Allergen Reactions  . Doxycycline Other (See Comments)    Mouth sores     Patient Measurements: Height: 5\' 10"  (177.8 cm) Weight: 246 lb (111.6 kg) IBW/kg (Calculated) : 73 Heparin Dosing Weight: 97 Kg  Vital Signs: Temp: 97.6 F (36.4 C) (03/20 0925) Temp Source: Oral (03/20 0925) BP: 138/85 (03/20 1400) Pulse Rate: 86 (03/20 1400)  Labs: Recent Labs    03/06/19 1644 03/06/19 1844 03/07/19 0211 03/07/19 0426  HGB 15.2  --  15.1  --   HCT 45.1  --  46.1  --   PLT 176  --  175  --   APTT  --  33 55*  --   HEPARINUNFRC  --  0.56 0.28*  --   CREATININE 0.93  --  0.87  --   TROPONINI 0.07*  --   --  0.37*    Estimated Creatinine Clearance: 101.6 mL/min (by C-G formula based on SCr of 0.87 mg/dL).   Medical History: Past Medical History:  Diagnosis Date  . Asthma   . COPD (chronic obstructive pulmonary disease) ([place])   . Diabetes mellitus    Type II  . Early Pulmonary fibrosis ([place])   . GERD (gastroesophageal reflux disease)   . History of echocardiogram    a. 02/2017 Echo: EF 60-65%, no rwma, mild MR, mod dil LA. Nl RV fxn. PASP 32mmHg.  Marland Kitchen Hyperlipidemia   . Hypertension   . Morbid obesity ([place])   . Non-obstructive CAD (coronary artery disease)    a. 2011 Cath: nonobs dzs; b. 07/2014 Cath: LM 30, LAD nl, LCX nl, RCA 20p, 70m.  Marland Kitchen Permanent atrial fibrillation    a. CHA2DS2VASc = 4-->xarelto.    Medications:  Scheduled:  . aspirin      . aspirin EC  81 mg Oral Daily  . atorvastatin  80 mg Oral Daily  . [START ON 03/08/2019] clopidogrel  75 mg Oral Q breakfast  . diltiazem  180 mg Oral Daily  . famotidine      . gabapentin  1,200 mg Oral QHS  . insulin aspart  0-5 Units Subcutaneous QHS  . insulin aspart  0-9 Units Subcutaneous TID WC  . metoprolol succinate  25 mg Oral Daily  . mometasone-formoterol  2 puff Inhalation BID  . multivitamin with  minerals  1 tablet Oral Daily  . pantoprazole  40 mg Oral Daily  . ramipril  10 mg Oral Daily  . sodium chloride flush  3 mL Intravenous Q12H    Assessment: Patient has been taking Rivaroxaban 20 mg PO daily PTA. Took last dose on 3/18 @ 2000, therefore will initiate therapy immediately.  3/20 Heparin infusion started @ 1300 units/hr 3/20  @ 0211 aPTT 55, HL 0.28. both levels subtherapeutic Both levels subtherapeutic. Will order 1400 unit heparin bolus and increase heparin infusion to 1450 units/hr.     Goal of Therapy:  Heparin level 0.3-0.7 units/ml aPTT 66-102 seconds Monitor platelets by anticoagulation protocol: Yes   Plan:  3/20 Heparin to be restarted 2 hours after TR band removal. Band removed at 1340 per discussion with RN. Will order heparin to start 1545 at 1450 units/hr. Will order aPTT level for 6 hours after drip start.  Will order HL and CBC  AM labs daily per protocol.  Noralee Space, PharmD, BCPS Clinical Pharmacist 03/07/2019 3:32 PM

## 2019-03-07 NOTE — Progress Notes (Signed)
*  PRELIMINARY RESULTS* Echocardiogram 2D Echocardiogram has been performed.  Ernest [name] 03/07/2019, 1:48 PM

## 2019-03-07 NOTE — Consult Note (Signed)
[facility] for Heparin Drip Indication: chest pain/ACS  Allergies  Allergen Reactions  . Doxycycline Other (See Comments)    Mouth sores     Patient Measurements: Height: 5\' 10"  (177.8 cm) Weight: 246 lb (111.6 kg) IBW/kg (Calculated) : 73 Heparin Dosing Weight: 97 Kg  Vital Signs: Temp: 97.8 F (36.6 C) (03/20 1945) Temp Source: Oral (03/20 1945) BP: 142/80 (03/20 1945) Pulse Rate: 96 (03/20 1945)  Labs: Recent Labs    03/06/19 1644 03/06/19 1844 03/07/19 0211 03/07/19 0426 03/07/19 1537 03/07/19 2247  HGB 15.2  --  15.1  --   --   --   HCT 45.1  --  46.1  --   --   --   PLT 176  --  175  --   --   --   APTT  --  33 55*  --   --  68*  HEPARINUNFRC  --  0.56 0.28*  --   --   --   CREATININE 0.93  --  0.87  --   --   --   TROPONINI 0.07*  --   --  0.37* 0.23*  --     Estimated Creatinine Clearance: 101.6 mL/min (by C-G formula based on SCr of 0.87 mg/dL).   Medical History: Past Medical History:  Diagnosis Date  . Asthma   . COPD (chronic obstructive pulmonary disease) ([place])   . Diabetes mellitus    Type II  . Early Pulmonary fibrosis ([place])   . GERD (gastroesophageal reflux disease)   . History of echocardiogram    a. 02/2017 Echo: EF 60-65%, no rwma, mild MR, mod dil LA. Nl RV fxn. PASP 28mmHg.  Marland Kitchen Hyperlipidemia   . Hypertension   . Morbid obesity ([place])   . Non-obstructive CAD (coronary artery disease)    a. 2011 Cath: nonobs dzs; b. 07/2014 Cath: LM 30, LAD nl, LCX nl, RCA 20p, 86m.  Marland Kitchen Permanent atrial fibrillation    a. CHA2DS2VASc = 4-->xarelto.    Medications:  Scheduled:  . aspirin EC  81 mg Oral Daily  . atorvastatin  80 mg Oral Daily  . [START ON 03/08/2019] clopidogrel  75 mg Oral Q breakfast  . diltiazem  180 mg Oral Daily  . gabapentin  1,200 mg Oral QHS  . insulin aspart  0-5 Units Subcutaneous QHS  . insulin aspart  0-9 Units Subcutaneous TID WC  . metoprolol succinate  25 mg Oral Daily  .  mometasone-formoterol  2 puff Inhalation BID  . multivitamin with minerals  1 tablet Oral Daily  . pantoprazole  40 mg Oral Daily  . ramipril  10 mg Oral Daily  . sodium chloride flush  3 mL Intravenous Q12H    Assessment: Patient has been taking Rivaroxaban 20 mg PO daily PTA. Took last dose on 3/18 @ 2000, therefore will initiate therapy immediately.  3/20 Heparin infusion started @ 1300 units/hr 3/20  @ 0211 aPTT 55, HL 0.28. both levels subtherapeutic, infusion increased to 1450 units/hr.  3/20 Heparin drip and held and then restarted started 2 hours after TR removal @ rate of 1450 units/hr   Goal of Therapy:  Heparin level 0.3-0.7 units/ml aPTT 66-102 seconds Monitor platelets by anticoagulation protocol: Yes   Plan:  3/20 aPTT 68 seconds. Level is just barely therapeutic. Will increase infusion to 1500 units/hr and recheck aPTT and heparin level in 6 hours.   Continue to order and monitor HL and CBC daily per protocol.  Pernell Dupre, PharmD, BCPS Clinical Pharmacist 03/07/2019 11:22 PM

## 2019-03-07 NOTE — Progress Notes (Signed)
[facility] [place] NAME: Zak Gondek    MR#:  [id number]  DATE OF BIRTH:  November 29, 1951  SUBJECTIVE:  CHIEF COMPLAINT:   Chief Complaint  Patient presents with  . Chest Pain   Patient seen today status post cath in the recovery area. No chest pain or discomfort, feels shortness of breath and fatigue is improved.  No complaints, asking for a urinal to urinate and getting ECHO when seen.  REVIEW OF SYSTEMS:  CONSTITUTIONAL: No fever,  chills, or fatigue. EYES: No blurred or double vision.  Wears glasses EARS, NOSE, AND THROAT: No tinnitus or ear pain. No sore throat RESPIRATORY: No cough.  Shortness of breath resolving. No wheezing or hemoptysis.  CARDIOVASCULAR:  Negative for chest pain.  No orthopnea, edema, or palpitations.  GASTROINTESTINAL: No nausea, vomiting, diarrhea or abdominal pain. No blood in bowel movements.  Positive for constipation GENITOURINARY: No dysuria, hematuria.  ENDOCRINE: No polyuria, nocturia,  HEMATOLOGY: No anemia, easy bruising or bleeding SKIN: No rash or lesion. MUSCULOSKELETAL: Positive for arthritis.   NEUROLOGIC: No tingling, numbness, weakness.  PSYCHIATRY: No anxiety or depression.  DRUG ALLERGIES:   Allergies  Allergen Reactions  . Doxycycline Other (See Comments)    Mouth sores    VITALS:  Blood pressure 125/71, pulse 87, temperature 98.7 F (37.1 C), temperature source Oral, resp. rate 16, height 5\' 10"  (1.778 m), weight 111.6 kg, SpO2 96 %. PHYSICAL EXAMINATION:  GENERAL:  68 y.o.-year-old patient lying in the bed with no acute distress.  EYES: Pupils equal, round, reactive to light and accommodation. No scleral icterus. Extraocular muscles intact.  HEENT: Head atraumatic, normocephalic. Oropharynx and nasopharynx clear.  NECK:  Supple, no jugular venous distention. No thyroid enlargement, no tenderness.  LUNGS: Normal breath sounds bilaterally, no wheezing, rales,rhonchi or crepitation.  No use of accessory muscles of respiration.  CARDIOVASCULAR: Regular rate and rhythm. No murmurs, rubs, or gallops.  ABDOMEN: Obese.  Soft, nontender, nondistended. Bowel sounds present. No organomegaly or mass.  EXTREMITIES: No pedal edema, cyanosis, or clubbing.  Distal pulses 2+ NEUROLOGIC: Cranial nerves II through XII are intact. Muscle strength 5/5 in all extremities. Sensation intact. Gait not checked.  PSYCHIATRIC: The patient is alert and oriented x 3.  SKIN: No rash, lesion, or ulcer.  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 Mediastinal contours and pulmonary vascularity normal. Decreased lung volumes with minimal bibasilar atelectasis. Upper lungs clear. No definite infiltrate, pleural effusion or pneumothorax. Bones unremarkable. IMPRESSION: Enlargement of cardiac silhouette. Minimal bibasilar atelectasis. Electronically Signed   By: Lavonia Dana M.D.   On: 03/06/2019 17:26   ASSESSMENT AND  PLAN:   Lisa Blakeman  is a 68 y.o. male  past medical history of atrial fibrillation on Xarelto, obesity, COPD, GERD, hyperlipidemia, type 2 diabetes, CAD who presents with  chest pain.  Associated with shortness of breath, fatigue, radiation to the left arm. In the ER his first troponin was borderline at 0.07.  Hospitalist services contacted for further evaluation.  03/07/2019 he underwent cardiac catheterization.   1.    N STEMI.    Troponin elevated to 0.37 this morning. [facility] cardiology performed cardiac catheterization 03/07/2019.  Held Xarelto and placed on heparin drip.  Ordered nitro patch. Ordered aspirin and metoprolol.  ACE inhibitor.  Continue statin.  Overnight monitoring per Dr. Saunders Revel.  Echo pending.  Restart heparin infusion.  Restart Xarelto tomorrow to continue Xarelto and Plavix 75 mg daily for 12 months. Secondary prevention with control of hyperlipidemia and diabetes as below.  Obesity management.  2.  Permanent atrial fibrillation.  Continue metoprolol and Cardizem.  Hold Xarelto and give heparin instead.  Restart Xarelto tomorrow.    3.  Acute hypoxic respiratory failure resolved. Oxygen supplementation. Chest x-ray negative continue inhalers.  4.  Type 2 diabetes mellitus hold oral medications and put on sliding scale insulin.  Last A1c 8.8% in February.  Continue ACE inhibitor and statin.  Close PCP follow-up  5.  Obesity. Weight loss needed. Consider consulting nutrition for counseling  6.  Hypertension continue home medications  7.  Hyperlipidemia continue Lipitor.  Total cholesterol 209 LDL 111 triglycerides 340 HDL 30 in February.  8.  Early pulmonary fibrosis. History of COPD.  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  Nonbleeding and cauterized. Monitor CBC daily.  All the records are reviewed and case is discussed with Care Management/Social Worker. Management plans discussed with the patient and/or family and they are in agreement.  CODE STATUS: Full Code  TOTAL TIME TAKING CARE OF THIS PATIENT: 30 minutes.   More than 50% of the time was spent in counseling/coordination of care: YES   POSSIBLE D/C IN 1-2 DAYS, DEPENDING ON CLINICAL CONDITION.   Ripley Fraise PA-C on 03/07/2019 at 4:28 PM  Between 7am to 6pm - Pager - [phone]  After 6 pm go to [email or web address] - Proofreader  [facility]  Office  [phone]  CC: Primary care physician; Guadalupe Maple, MD  Note: This dictation was prepared with Dragon dictation along with smaller phrase technology. Any transcriptional errors that result from this process are unintentional.

## 2019-03-07 NOTE — Progress Notes (Signed)
I have reviewed the assessment done by the student nurse and agree with his findings or have made changes.

## 2019-03-07 NOTE — Progress Notes (Signed)
Initial Nutrition Assessment  DOCUMENTATION CODES:   Obesity unspecified  INTERVENTION:   - Once diet advanced, Ensure Max po daily, each supplement provides 150 kcal and 30 grams of protein  - MVI with minerals daily  NUTRITION DIAGNOSIS:   Inadequate oral intake related to inability to eat as evidenced by NPO status.  GOAL:   Patient will meet greater than or equal to 90% of their needs  MONITOR:   Diet advancement, Labs, Weight trends, I & O's  REASON FOR ASSESSMENT:   Malnutrition Screening Tool    ASSESSMENT:   68 year old male who presented to the ED on 3/19 with chest pain. PMH of asthma, COPD, T2DM, HTN, HLD, CAD, GERD, atrial fibrillation.  Attempted to speak with pt on several occasions. Pt in cath lab on all attempts.  Pt was NPO for procedure. No meal completions documented prior to NPO.  Per cardiology note, pt "has been more active overall" since cardiology clinic appointment in summer of 2019 and "has lost some weight." Reviewed weight history in chart, pt with 10.1 kg weight loss since 02/04/19. This is an 8.3% weight loss in 1 month which is significant for timeframe. Unsure whether all of this weight loss was related to pt's efforts to lose weight. Will attempt to obtain more details at follow-up.  Will order Ensure Max oral nutrition supplement once diet advanced. Also will order MVI with minerals.  Medications reviewed and include: Lasix 20 mg BID, SSI, Protonix  Labs reviewed. CBG's: 123, 99, 100 x 24 hours  NUTRITION - FOCUSED PHYSICAL EXAM:  Unable to complete at this time. Pt in cath lab on all attempts.  Diet Order:   Diet Order    None      EDUCATION NEEDS:   No education needs have been identified at this time  Skin:  Skin Assessment: Reviewed RN Assessment  Last BM:  03/06/19  Height:   Ht Readings from Last 1 Encounters:  03/07/19 5\' 10"  (1.778 m)    Weight:   Wt Readings from Last 1 Encounters:  03/07/19 111.6 kg     Ideal Body Weight:  75.5 kg  BMI:  Body mass index is 35.3 kg/m.  Estimated Nutritional Needs:   Kcal:  2000-2200  Protein:  110-125 grams  Fluid:  >/= 2.0 L    Gaynell Face, MS, RD, LDN Inpatient Clinical Dietitian Pager: [phone] Weekend/After Hours: [phone]

## 2019-03-07 NOTE — Interval H&P Note (Signed)
History and Physical Interval Note:  03/07/2019 9:35 AM  Ernest [name]  has presented today for cardiac catheterization, with the diagnosis of NSTEMI.  The various methods of treatment have been discussed with the patient and family. After consideration of risks, benefits and other options for treatment, the patient has consented to  Procedure(s): LEFT HEART CATH AND CORONARY ANGIOGRAPHY (N/A) as a surgical intervention.  The patient's history has been reviewed, patient examined, no change in status, stable for surgery.  I have reviewed the patient's chart and labs.  Questions were answered to the patient's satisfaction.    Cath Lab Visit (complete for each Cath Lab visit)  Clinical Evaluation Leading to the Procedure:   ACS: Yes.    Non-ACS:  N/A  Hosea Han[place]alt

## 2019-03-07 NOTE — Consult Note (Signed)
Cardiology Consult    Patient ID: Ernest [name] MRN: [id number], DOB/AGE: 1951-02-25   Admit date: 03/06/2019 Date of Consult: 03/07/2019  Primary Physician: Guadalupe Maple, MD Primary Cardiologist: Ida Rogue, MD Requesting Provider: V. Manuella Ghazi, MD  Patient Profile    Ernest [name] is a 68 y.o. male with a history of permanent afib on xarelto, obesity, DMII, HTN, HL, GERD, and COPD, who is being seen today for the evaluation of c/p and NSTEMI at the request of Dr. Manuella Ghazi.  Past Medical History   Past Medical History:  Diagnosis Date  . Asthma   . COPD (chronic obstructive pulmonary disease) ([place])   . Diabetes mellitus    Type II  . Early Pulmonary fibrosis ([place])   . GERD (gastroesophageal reflux disease)   . History of echocardiogram    a. 02/2017 Echo: EF 60-65%, no rwma, mild MR, mod dil LA. Nl RV fxn. PASP 45mmHg.  Marland Kitchen Hyperlipidemia   . Hypertension   . Morbid obesity ([place])   . Non-obstructive CAD (coronary artery disease)    a. 2011 Cath: nonobs dzs; b. 07/2014 Cath: LM 30, LAD nl, LCX nl, RCA 20p, 76m.  Marland Kitchen Permanent atrial fibrillation    a. CHA2DS2VASc = 4-->xarelto.    Past Surgical History:  Procedure Laterality Date  . ANKLE SURGERY    . CARDIAC CATHETERIZATION  05/2010   [facility]  . CARDIAC CATHETERIZATION     [facility]  . CARDIAC CATHETERIZATION     [facility]  . CARDIAC CATHETERIZATION  07/2014   [facility]  . COLONOSCOPY    . COLONOSCOPY WITH PROPOFOL N/A 06/26/2018   Procedure: COLONOSCOPY WITH PROPOFOL;  Surgeon: Virgel Manifold, MD;  Location: [facility] ENDOSCOPY;  Service: Endoscopy;  Laterality: N/A;  . ENTEROSCOPY N/A 08/05/2018   Procedure: ENTEROSCOPY;  Surgeon: Virgel Manifold, MD;  Location: [facility] ENDOSCOPY;  Service: Endoscopy;  Laterality: N/A;  . ESOPHAGOGASTRODUODENOSCOPY (EGD) WITH PROPOFOL N/A 06/26/2018   Procedure: ESOPHAGOGASTRODUODENOSCOPY (EGD) WITH PROPOFOL;  Surgeon: Virgel Manifold, MD;  Location: [facility] ENDOSCOPY;  Service: Endoscopy;   Laterality: N/A;  . TESTICLE SURGERY  70's  . TUMOR EXCISION     Neck and finger; benign  . WRIST SURGERY       Allergies  Allergies  Allergen Reactions  . Doxycycline Other (See Comments)    Mouth sores     History of Present Illness    68 y/o ? with the above PMH including permanent afib, HTN, HL, DMII, obesity, GERD, and COPD.  He was also prev evaluated in 2015 in the setting of c/p, at which time diagnostic cath showed nonobstructive coronary dzs.  He was last seen in cardiology clinic over the summer of 2019, @ which time he was doing well.  Since then, he says that he has been working part-time - manual labor - and has been more active overall.  He has lost some weight.  His activity tolerance had been pretty good, but on 3/19, he was @ work and developed 10/10 exertional sscp radiating down his left arm and assoc w/ dyspnea.  He rested w/o improvement and then would try to work some more, but was not able to due to severe c/p.  He eventually presented to the ED where ECG was non-acute.  Initial trop was mildly elevated @ 0.07.  He was placed on NTP with some relief of discomfort, along with heparin.  He was admitted for further eval and cont to report 5/10 c/p this AM.  Trop  0.37 this AM.  He is NPO. Last dose of xarelto was the evening of 3/18.  Inpatient Medications    . aspirin EC  81 mg Oral Daily  . atorvastatin  40 mg Oral Daily  . diltiazem  180 mg Oral Daily  . furosemide  20 mg Oral BID  . gabapentin  1,200 mg Oral QHS  . insulin aspart  0-5 Units Subcutaneous QHS  . insulin aspart  0-9 Units Subcutaneous TID WC  . metoprolol succinate  25 mg Oral Daily  . mometasone-formoterol  2 puff Inhalation BID  . nitroGLYCERIN  0.5 inch Topical Q6H  . pantoprazole  40 mg Oral Daily  . ramipril  10 mg Oral Daily    Family History    Family History  Problem Relation Age of Onset  . Hypertension Mother   . Hyperlipidemia Mother   . Diabetes Mother   . Heart disease  Mother        CABG  . Pneumonia Father        rare type  . Diabetes Other   . Depression Other   . Coronary artery disease Other   . Alcohol abuse Other   . Hypertension Other   . Hyperlipidemia Other   . Cancer Brother   . Kidney Stones Brother    He indicated that his mother is deceased. He indicated that his father is deceased. He indicated that both of his sisters are alive. He indicated that two of his three brothers are alive. He indicated that the status of his other is unknown.   Social History    Social History   Socioeconomic History  . Marital status: Widowed    Spouse name: Not on file  . Number of children: Not on file  . Years of education: Not on file  . Highest education level: 8th grade  Occupational History  . Occupation: part time   Social Needs  . Financial resource strain: Not hard at all  . Food insecurity:    Worry: Never true    Inability: Never true  . Transportation needs:    Medical: No    Non-medical: No  Tobacco Use  . Smoking status: Former Smoker    Packs/day: 4.00    Years: 28.00    Pack years: 112.00    Types: Cigarettes    Last attempt to quit: 08/25/1993    Years since quitting: 25.5  . Smokeless tobacco: Never Used  Substance and Sexual Activity  . Alcohol use: Not Currently    Comment: rarely, occasional beer   . Drug use: No  . Sexual activity: Not on file  Lifestyle  . Physical activity:    Days per week: 0 days    Minutes per session: 0 min  . Stress: Not at all  Relationships  . Social connections:    Talks on phone: More than three times a week    Gets together: More than three times a week    Attends religious service: Never    Active member of club or organization: No    Attends meetings of clubs or organizations: Never    Relationship status: Widowed  . Intimate partner violence:    Fear of current or ex partner: No    Emotionally abused: No    Physically abused: No    Forced sexual activity: No  Other  Topics Concern  . Not on file  Social History Narrative   No regular exercise.     Review of  Systems    General:  No chills, fever, night sweats or weight changes.  Cardiovascular:  +++ chest pain, +++ dyspnea on exertion, no edema, orthopnea, palpitations, paroxysmal nocturnal dyspnea. Dermatological: No rash, lesions/masses Respiratory: No cough, +++ dyspnea Urologic: No hematuria, dysuria Abdominal:   No nausea, vomiting, diarrhea, bright red blood per rectum, melena, or hematemesis Neurologic:  No visual changes, wkns, changes in mental status. All other systems reviewed and are otherwise negative except as noted above.  Physical Exam    Blood pressure 138/89, pulse 81, temperature 97.7 F (36.5 C), temperature source Oral, resp. rate 18, height 5\' 10"  (1.778 m), weight 111.8 kg, SpO2 98 %.  General: Pleasant, NAD Psych: Normal affect. Neuro: Alert and oriented X 3. Moves all extremities spontaneously. HEENT: Normal  Neck: Supple without bruits or JVD. Lungs:  Resp regular and unlabored, CTA. Heart: RRR no s3, s4, or murmurs. Abdomen: Obese, Soft, non-tender, non-distended, BS + x 4.  Extremities: No clubbing, cyanosis or edema. DP/PT/Radials 2+ and equal bilaterally.  Labs    Troponin  Recent Labs    03/06/19 1644 03/07/19 0426  TROPONINI 0.07* 0.37*   Lab Results  Component Value Date   WBC 8.8 03/07/2019   HGB 15.1 03/07/2019   HCT 46.1 03/07/2019   MCV 85.8 03/07/2019   PLT 175 03/07/2019    Recent Labs  Lab 03/06/19 1644 03/07/19 0211  NA 142 143  K 3.7 3.7  CL 106 108  CO2 24 26  BUN 19 19  CREATININE 0.93 0.87  CALCIUM 10.1 9.4  PROT 7.6  --   BILITOT 1.8*  --   ALKPHOS 52  --   ALT 21  --   AST 22  --   GLUCOSE 141* 114*   Lab Results  Component Value Date   CHOL 209 (H) 01/27/2019   HDL 30 (L) 01/27/2019   LDLCALC 111 (H) 01/27/2019   TRIG 340 (H) 01/27/2019    Radiology Studies    Dg Chest 2 View  Result Date: 03/06/2019  CLINICAL DATA:  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 Mediastinal contours and pulmonary vascularity normal. Decreased lung volumes with minimal bibasilar atelectasis. Upper lungs clear. No definite infiltrate, pleural effusion or pneumothorax. Bones unremarkable. IMPRESSION: Enlargement of cardiac silhouette. Minimal bibasilar atelectasis. Electronically Signed   By: Lavonia Dana M.D.   On: 03/06/2019 17:26    ECG & Cardiac Imaging    Afib, 97, no acute st/t changes - personally reviewed.  Assessment & Plan    1. NSTEMI/CAD:  Pt admitted with 1 day h/o rest and exertional c/p radiating down his L arm and assoc w/ dyspnea.  Trop 0.37 this AM and he cont to have 5/10 c/p.  Hemodynamically stable.  Last dose of xarelto was the evening of 3/18.  We will pursue diagnostic catheterization this AM.  The patient understands that risks include but are not limited to stroke (1 in 1000), death (1 in 1000), kidney failure [usually temporary] (1 in 500), bleeding (1 in 200), allergic reaction [possibly serious] (1 in 200), and agrees to proceed.  Cont asa,  blocker, acei, and statin.  Pending potential need for PCI, may consider reducing xarelto dose in the setting of P2Y12 inhibitor rx (PIONEER AF-PCI).  2.  Persistent  Afib:  Rate controlled on  blocker and dilt.  Xarelto on hold.  3.  Essential HTN:  Stable.  4.  HL:  Escalate lipitor dosing in setting of ACS. LDL 111 in Feb.  5.  DMII:  A1c 8.8 in Feb.  Per IM.  Cont acei/statin.  6. 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  These were non-bleeding and cauterized.  Will have to watch for bleeding on xarelto + p2y12i (if initiated).   Signed, Murray Hodgkins, NP 03/07/2019, 8:07 AM  For questions or updates, please contact   Please consult [email or web address] for contact info under Cardiology/STEMI.

## 2019-03-08 DIAGNOSIS — E782 Mixed hyperlipidemia: Secondary | ICD-10-CM

## 2019-03-08 DIAGNOSIS — E118 Type 2 diabetes mellitus with unspecified complications: Secondary | ICD-10-CM

## 2019-03-08 DIAGNOSIS — I249 Acute ischemic heart disease, unspecified: Secondary | ICD-10-CM

## 2019-03-08 DIAGNOSIS — I214 Non-ST elevation (NSTEMI) myocardial infarction: Principal | ICD-10-CM

## 2019-03-08 DIAGNOSIS — R079 Chest pain, unspecified: Secondary | ICD-10-CM

## 2019-03-08 LAB — GLUCOSE, CAPILLARY
Glucose-Capillary: 154 mg/dL — ABNORMAL HIGH (ref 70–99)
Glucose-Capillary: 128 mg/dL — ABNORMAL HIGH (ref 70–99)

## 2019-03-08 LAB — CBC
HCT: 49.2 % (ref 39.0–52.0)
Hemoglobin: 16.2 g/dL (ref 13.0–17.0)
MCH: 27.7 pg (ref 26.0–34.0)
MCHC: 32.9 g/dL (ref 30.0–36.0)
MCV: 84.1 fL (ref 80.0–100.0)
Platelets: 186 10*3/uL (ref 150–400)
RBC: 5.85 MIL/uL — ABNORMAL HIGH (ref 4.22–5.81)
RDW: 13.9 % (ref 11.5–15.5)
WBC: 8.3 10*3/uL (ref 4.0–10.5)
nRBC: 0 % (ref 0.0–0.2)

## 2019-03-08 LAB — BASIC METABOLIC PANEL
Anion gap: 12 (ref 5–15)
BUN: 18 mg/dL (ref 8–23)
CO2: 24 mmol/L (ref 22–32)
Calcium: 9.5 mg/dL (ref 8.9–10.3)
Chloride: 104 mmol/L (ref 98–111)
Creatinine, Ser: 0.91 mg/dL (ref 0.61–1.24)
GFR calc Af Amer: >60 mL/min (ref >60)
GFR calc non Af Amer: >60 mL/min (ref >60)
Glucose, Bld: 145 mg/dL — ABNORMAL HIGH (ref 70–99)
Potassium: 3.7 mmol/L (ref 3.5–5.1)
Sodium: 140 mmol/L (ref 135–145)

## 2019-03-08 LAB — APTT: aPTT: 73 seconds — ABNORMAL HIGH (ref 24–36)

## 2019-03-08 LAB — HIV ANTIBODY (ROUTINE TESTING W REFLEX): HIV Screen 4th Generation wRfx: NONREACTIVE

## 2019-03-08 LAB — HEPARIN LEVEL (UNFRACTIONATED): Heparin Unfractionated: 0.3 IU/mL (ref 0.30–0.70)

## 2019-03-08 MED ORDER — CLOPIDOGREL BISULFATE 75 MG PO TABS: 75.0000 mg | ORAL_TABLET | Freq: Every day | ORAL | 0 refills | Status: DC

## 2019-03-08 MED ORDER — ATORVASTATIN CALCIUM 80 MG PO TABS: 80.0000 mg | ORAL_TABLET | Freq: Every day | ORAL | 0 refills | Status: DC

## 2019-03-08 NOTE — Plan of Care (Signed)
  Problem: Cardiovascular: Goal: Ability to achieve and maintain adequate cardiovascular perfusion will improve Outcome: Progressing Goal: Vascular access site(s) Level 0-1 will be maintained Outcome: Progressing   

## 2019-03-08 NOTE — Consult Note (Addendum)
[place] for Heparin Drip Indication: chest pain/ACS  Allergies  Allergen Reactions  . Doxycycline Other (See Comments)    Mouth sores     Patient Measurements: Height: 5\' 10"  (177.8 cm) Weight: 242 lb 12.8 oz (110.1 kg) IBW/kg (Calculated) : 73 Heparin Dosing Weight: 97 Kg  Vital Signs: Temp: 98.5 F (36.9 C) (03/21 0526) Temp Source: Oral (03/21 0526) BP: 157/93 (03/21 0526) Pulse Rate: 92 (03/21 0530)  Labs: Recent Labs    03/06/19 1644  03/06/19 1844 03/07/19 0211 03/07/19 0426 03/07/19 1537 03/07/19 2247 03/08/19 0528  HGB 15.2  --   --  15.1  --   --   --  16.2  HCT 45.1  --   --  46.1  --   --   --  49.2  PLT 176  --   --  175  --   --   --  186  APTT  --    < > 33 55*  --   --  68* 73*  HEPARINUNFRC  --   --  0.56 0.28*  --   --   --  0.30  CREATININE 0.93  --   --  0.87  --   --   --  0.91  TROPONINI 0.07*  --   --   --  0.37* 0.23* 0.22*  --    < > = values in this interval not displayed.    Estimated Creatinine Clearance: 96.5 mL/min (by C-G formula based on SCr of 0.91 mg/dL).   Medical History: Past Medical History:  Diagnosis Date  . Asthma   . COPD (chronic obstructive pulmonary disease) ([place])   . Diabetes mellitus    Type II  . Early Pulmonary fibrosis ([place])   . GERD (gastroesophageal reflux disease)   . History of echocardiogram    a. 02/2017 Echo: EF 60-65%, no rwma, mild MR, mod dil LA. Nl RV fxn. PASP 86mmHg.  Marland Kitchen Hyperlipidemia   . Hypertension   . Morbid obesity ([place])   . Non-obstructive CAD (coronary artery disease)    a. 2011 Cath: nonobs dzs; b. 07/2014 Cath: LM 30, LAD nl, LCX nl, RCA 20p, 3m.  Marland Kitchen Permanent atrial fibrillation    a. CHA2DS2VASc = 4-->xarelto.    Medications:  Scheduled:  . aspirin EC  81 mg Oral Daily  . atorvastatin  80 mg Oral Daily  . clopidogrel  75 mg Oral Q breakfast  . diltiazem  180 mg Oral Daily  . gabapentin  1,200 mg Oral QHS  . insulin aspart  0-5 Units  Subcutaneous QHS  . insulin aspart  0-9 Units Subcutaneous TID WC  . metoprolol succinate  25 mg Oral Daily  . mometasone-formoterol  2 puff Inhalation BID  . multivitamin with minerals  1 tablet Oral Daily  . pantoprazole  40 mg Oral Daily  . ramipril  10 mg Oral Daily  . sodium chloride flush  3 mL Intravenous Q12H    Assessment: Patient has been taking Rivaroxaban 20 mg PO daily PTA. Took last dose on 3/18 @ 2000, therefore will initiate therapy immediately.  3/20 Heparin infusion started @ 1300 units/hr 3/20  @ 0211 aPTT 55, HL 0.28. both levels subtherapeutic, infusion increased to 1450 units/hr.  3/20 Heparin drip and held and then restarted started 2 hours after TR removal @ rate of 1450 units/hr  3/20 @ 2247 aPTT 68. Barely therapeutic. Infusion increased to 1500 units/hr   Goal of Therapy:  Heparin level 0.3-0.7 units/ml aPTT 66-102 seconds Monitor platelets by anticoagulation protocol: Yes   Plan:  3/21 aPTT 73 seconds, HL 0.30. Both levels barely therapeutic. Will increase infusion to 1550 units/hr and recheck confirmatory aPTT and heparin level in 6 hours. If HL and aPTT are still correlating will continue to adjust heparin infusion by HL's only.   Continue to order and monitor HL and CBC daily per protocol.  Pernell Dupre, PharmD, BCPS Clinical Pharmacist 03/08/2019 6:39 AM

## 2019-03-08 NOTE — Progress Notes (Signed)
Progress Note  Patient Name: Ernest [name] Date of Encounter: 03/08/2019  Primary Cardiologist: Ida Rogue, MD   Subjective   Reports that he feels well following the stent yesterday to his mid LAD Still on heparin infusion Cardiac catheterization results and medications reviewed with him  Inpatient Medications    Scheduled Meds:  aspirin EC  81 mg Oral Daily   atorvastatin  80 mg Oral Daily   clopidogrel  75 mg Oral Q breakfast   diltiazem  180 mg Oral Daily   gabapentin  1,200 mg Oral QHS   insulin aspart  0-5 Units Subcutaneous QHS   insulin aspart  0-9 Units Subcutaneous TID WC   metoprolol succinate  25 mg Oral Daily   mometasone-formoterol  2 puff Inhalation BID   multivitamin with minerals  1 tablet Oral Daily   pantoprazole  40 mg Oral Daily   ramipril  10 mg Oral Daily   sodium chloride flush  3 mL Intravenous Q12H   Continuous Infusions:  sodium chloride     heparin 1,550 Units/hr (03/08/19 0825)   PRN Meds: sodium chloride, acetaminophen **OR** acetaminophen, albuterol, ondansetron **OR** ondansetron (ZOFRAN) IV, sodium chloride flush   Vital Signs    Vitals:   03/07/19 1945 03/08/19 0526 03/08/19 0530 03/08/19 0746  BP: (!) 142/80 (!) 157/93  (!) 142/81  Pulse: 96 (!) 111 92 82  Resp: 16 16    Temp: 97.8 F (36.6 C) 98.5 F (36.9 C)  98 F (36.7 C)  TempSrc: Oral Oral  Oral  SpO2: 92% 95%  96%  Weight:  110.1 kg    Height:        Intake/Output Summary (Last 24 hours) at 03/08/2019 1004 Last data filed at 03/08/2019 0400 Gross per 24 hour  Intake 462.07 ml  Output 350 ml  Net 112.07 ml   Last 3 Weights 03/08/2019 03/07/2019 03/06/2019  Weight (lbs) 242 lb 12.8 oz 246 lb 246 lb 6.4 oz  Weight (kg) 110.133 kg 111.585 kg 111.766 kg      Telemetry    Normal sinus rhythm- Personally Reviewed  ECG     - Personally Reviewed  Physical Exam   GEN: No acute distress.   Neck: No JVD Cardiac: RRR, no murmurs, rubs, or  gallops.  Respiratory: Clear to auscultation bilaterally. GI: Soft, nontender, non-distended  MS: No edema; No deformity. Neuro:  Nonfocal  Psych: Normal affect   Labs    Chemistry Recent Labs  Lab 03/06/19 1644 03/07/19 0211 03/08/19 0528  NA 142 143 140  K 3.7 3.7 3.7  CL 106 108 104  CO2 24 26 24   GLUCOSE 141* 114* 145*  BUN 19 19 18   CREATININE 0.93 0.87 0.91  CALCIUM 10.1 9.4 9.5  PROT 7.6  --   --   ALBUMIN 4.7  --   --   AST 22  --   --   ALT 21  --   --   ALKPHOS 52  --   --   BILITOT 1.8*  --   --   GFRNONAA >60 >60 >60  GFRAA >60 >60 >60  ANIONGAP 12 9 12      Hematology Recent Labs  Lab 03/06/19 1644 03/07/19 0211 03/08/19 0528  WBC 8.8 8.8 8.3  RBC 5.40 5.37 5.85*  HGB 15.2 15.1 16.2  HCT 45.1 46.1 49.2  MCV 83.5 85.8 84.1  MCH 28.1 28.1 27.7  MCHC 33.7 32.8 32.9  RDW 14.0 14.0 13.9  PLT 176 175  186    Cardiac Enzymes Recent Labs  Lab 03/06/19 1644 03/07/19 0426 03/07/19 1537 03/07/19 2247  TROPONINI 0.07* 0.37* 0.23* 0.22*   No results for input(s): TROPIPOC in the last 168 hours.   BNPNo results for input(s): BNP, PROBNP in the last 168 hours.   DDimer No results for input(s): DDIMER in the last 168 hours.   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 Mediastinal contours and pulmonary vascularity normal. Decreased lung volumes with minimal bibasilar atelectasis. Upper lungs clear. No definite infiltrate, pleural effusion or pneumothorax. Bones unremarkable. IMPRESSION: Enlargement of cardiac silhouette. Minimal bibasilar atelectasis. Electronically Signed   By: Lavonia Dana M.D.   On:  03/06/2019 17:26    Cardiac Studies   Cardiac cath   1. Severe single-vessel CAD with 80% mid LAD stenosis. 2. Non-obstructive LMCA, LCx, and RCA disease. 3. Successful PCI to mid LAD using Synergy 2.5 x 24 mm drug-eluting stent (post-dilated to 2.8 mm) with 0% residual stenosis and TIMI-3 flow.   Patient Profile     Ernest [name] is a 68 year old man with history of nonobstructive CAD, permanent atrial fibrillation on rivaroxaban, hypertension, hyperlipidemia, type 2 diabetes mellitus, COPD, and GERD, presenting with chest pain and elevated troponin, non-STEMI   Assessment & Plan    1) non-STEMI Severe single-vessel CAD with 80% mid LAD stenosis. Non-obstructive LMCA, LCx, and RCA disease. ---Successful PCI to mid LAD using Synergy 2.5 x 24 mm drug-eluting stent  -Started on Plavix, plan to resume Xarelto 20 mg today No aspirin at discharge  2) persistent A. 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   --Close monitoring on Plavix with Xarelto No aspirin at discharge    Total encounter time more than 25 minutes  Greater than 50% was spent in counseling and coordination of care with the patient    For questions or updates, please contact [place] Please consult [email or web address] for contact info under        Signed, Ida Rogue, MD  03/08/2019, 10:04 AM

## 2019-03-08 NOTE — Discharge Summary (Signed)
Ernest [name] NAME: Ernest [name]    MR#:  [id number]  DATE OF BIRTH:  1951-02-21  DATE OF ADMISSION:  03/06/2019   ADMITTING PHYSICIAN: Loletha Grayer, MD  DATE OF DISCHARGE: 03/08/2019  PRIMARY CARE PHYSICIAN: Guadalupe Maple, MD   ADMISSION DIAGNOSIS:  Chest pain, unspecified type [R07.9] DISCHARGE DIAGNOSIS:  Active Problems:   ACS (acute coronary syndrome) (HCC)   NSTEMI (non-ST elevated myocardial infarction) ([place])  SECONDARY DIAGNOSIS:   Past Medical History:  Diagnosis Date  . Asthma   . COPD (chronic obstructive pulmonary disease) ([place])   . Diabetes mellitus    Type II  . Early Pulmonary fibrosis ([place])   . GERD (gastroesophageal reflux disease)   . History of echocardiogram    a. 02/2017 Echo: EF 60-65%, no rwma, mild MR, mod dil LA. Nl RV fxn. PASP 34mmHg.  Marland Kitchen Hyperlipidemia   . Hypertension   . Morbid obesity ([place])   . Non-obstructive CAD (coronary artery disease)    a. 2011 Cath: nonobs dzs; b. 07/2014 Cath: LM 30, LAD nl, LCX nl, RCA 20p, 67m.  Marland Kitchen Permanent atrial fibrillation    a. CHA2DS2VASc = 4-->xarelto.   HOSPITAL COURSE:  Chief complaint; chest pain  History of presenting complaint; Ernest [name]  is a 68 y.o. male with a known history of atrial fibrillation, obesity presented with chest pain.  He had chest pain last night for about 15 minutes with left arm pain.  Today he felt fairly good and was on the job climbing up and down ladders.  He had to stop and rest numerous times in order to get the job done.  He was feeling so bad when he was going to drive over to the hospital that he had a pull over and call 911.In the ER his first troponin was borderline at 0.07.  Hospitalist services contacted for further evaluation.  Please refer to H&P dictated for further details   Hospital course; 1 NSTEMI.    Troponin elevated to 0.37 during this admission .[facility] cardiology performed cardiac  catheterization 03/07/2019. Held Xarelto and placed on heparin drip. Patient seen by cardiologist and subsequently had cardiac catheterization done which revealed severe single-vessel CAD with 80% mid LAD stenosis. Non-obstructive LMCA, LCx, and RCA disease.Successful PCI to mid LAD using Synergy 2.5 x 24 mm drug-eluting stent (post-dilated to 2.8 mm) with 0% residual stenosis and TIMI-3 flow. ordered nitro patch.  2D echocardiogram done revealed ejection fraction of 55 to 60%.  Patient reevaluated by cardiologist.  Clinically and hemodynamically stable.  Heparin drip discontinued.  Cleared for discharge.  Recommendation is to continue Xarelto which patient was already taking for atrial fibrillation on prior to admission.  Aspirin discontinued and patient started on Plavix.  To continue Xarelto and Plavix for 12 months. Secondary prevention with control of hyperlipidemia and diabetes . Obesity management.  Follow-up with cardiologist in 1 week  2. Permanent atrial fibrillation. Continue metoprolol and Cardizem.  Patient to resume anticoagulation with Xarelto as previously.   3. Acute hypoxic respiratory failure resolved. Chest x-ray negative continue inhalers.  Oxygen saturation of 96% on room air this morning.  4. Type 2 diabetes mellitus; Last A1c 8.8% in February.  Patient to hold metformin for 3 days prior to resumption given recent cardiac catheterization.  5. Obesity.  Encourage weight loss and lifestyle modifications  6. Hypertension continue home medications  7. Hyperlipidemia continue Lipitor.  Total cholesterol 209 LDL 111 triglycerides 340 HDL  30 in February.  8. Early pulmonary fibrosis. History of COPD. Stable 9. 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  Nonbleeding and cauterized.  Hemoglobin stable    DISCHARGE CONDITIONS:  Stable CONSULTS OBTAINED:  Treatment Team:  Nelva Bush, MD DRUG ALLERGIES:   Allergies   Allergen Reactions  . Doxycycline Other (See Comments)    Mouth sores    DISCHARGE MEDICATIONS:   Allergies as of 03/08/2019      Reactions   Doxycycline Other (See Comments)   Mouth sores       Medication List    STOP taking these medications   metFORMIN 500 MG tablet Commonly known as:  GLUCOPHAGE     TAKE these medications   albuterol 108 (90 Base) MCG/ACT inhaler Commonly known as:  PROVENTIL HFA;VENTOLIN HFA Inhale 2 puffs into the lungs every 4 (four) hours as needed for wheezing or shortness of breath.   atorvastatin 80 MG tablet Commonly known as:  LIPITOR Take 1 tablet (80 mg total) by mouth daily. Start taking on:  March 09, 2019 What changed:    medication strength  how much to take   budesonide-formoterol 160-4.5 MCG/ACT inhaler Commonly known as:  Symbicort Inhale 2 puffs into the lungs 2 (two) times daily.   canagliflozin 300 MG Tabs tablet Commonly known as:  INVOKANA Take 1 tablet (300 mg total) by mouth daily before breakfast.   clopidogrel 75 MG tablet Commonly known as:  PLAVIX Take 1 tablet (75 mg total) by mouth daily with breakfast. Start taking on:  March 09, 2019   diltiazem 180 MG 24 hr capsule Commonly known as:  CARDIZEM CD Take 1 capsule (180 mg total) by mouth daily.   furosemide 40 MG tablet Commonly known as:  LASIX Take 0.5 tablets (20 mg total) by mouth 2 (two) times daily.   gabapentin 600 MG tablet Commonly known as:  NEURONTIN Take 2 tablets (1,200 mg total) by mouth at bedtime.   glipiZIDE 5 MG tablet Commonly known as:  GLUCOTROL TAKE 1 TABLET BY MOUTH  DAILY BEFORE BREAKFAST   isosorbide mononitrate 60 MG 24 hr tablet Commonly known as:  IMDUR Take 1 tablet (60 mg total) by mouth daily.   metoprolol succinate 25 MG 24 hr tablet Commonly known as:  TOPROL-XL Take 1 tablet (25 mg total) by mouth daily.   omeprazole 20 MG capsule Commonly known as:  PRILOSEC Take 1 capsule (20 mg total) by mouth 2 (two)  times daily before a meal.   ramipril 10 MG capsule Commonly known as:  ALTACE Take 1 capsule (10 mg total) by mouth daily.   rivaroxaban 20 MG Tabs tablet Commonly known as:  Xarelto TAKE 1 TABLET(20 MG) BY MOUTH DAILY WITH SUPPER        DISCHARGE INSTRUCTIONS:   DIET:  Diabetic diet DISCHARGE CONDITION:  Stable ACTIVITY:  Activity as tolerated OXYGEN:  Home Oxygen: No.  Oxygen Delivery: room air DISCHARGE LOCATION:  home   If you experience worsening of your admission symptoms, develop shortness of breath, life threatening emergency, suicidal or homicidal thoughts you must seek medical attention immediately by calling 911 or calling your MD immediately  if symptoms less severe.  You Must read complete instructions/literature along with all the possible adverse reactions/side effects for all the Medicines you take and that have been prescribed to you. Take any new Medicines after you have completely understood and accpet all the possible adverse reactions/side effects.  Please note  You were cared for by a hospitalist during your hospital stay. If you have any questions about your discharge medications or the care you received while you were in the hospital after you are discharged, you can call the unit and asked to speak with the hospitalist on call if the hospitalist that took care of you is not available. Once you are discharged, your primary care physician will handle any further medical issues. Please note that NO REFILLS for any discharge medications will be authorized once you are discharged, as it is imperative that you return to your primary care physician (or establish a relationship with a primary care physician if you do not have one) for your aftercare needs so that they can reassess your need for medications and monitor your lab values.    On the day of Discharge:  VITAL SIGNS:  Blood pressure (!) 142/81, pulse 82, temperature 98 F (36.7 C), temperature  source Oral, resp. rate 16, height 5\' 10"  (1.778 m), weight 110.1 kg, SpO2 96 %. PHYSICAL EXAMINATION:  GENERAL:  68 y.o.-year-old patient lying in the bed with no acute distress.  EYES: Pupils equal, round, reactive to light and accommodation. No scleral icterus. Extraocular muscles intact.  HEENT: Head atraumatic, normocephalic. Oropharynx and nasopharynx clear.  NECK:  Supple, no jugular venous distention. No thyroid enlargement, no tenderness.  LUNGS: Normal breath sounds bilaterally, no wheezing, rales,rhonchi or crepitation. No use of accessory muscles of respiration.  CARDIOVASCULAR: S1, S2 normal. No murmurs, rubs, or gallops.  ABDOMEN: Soft, non-tender, non-distended. Bowel sounds present. No organomegaly or mass.  EXTREMITIES: No pedal edema, cyanosis, or clubbing.  NEUROLOGIC: Cranial nerves II through XII are intact. Muscle strength 5/5 in all extremities. Sensation intact. Gait not checked.  PSYCHIATRIC: The patient is alert and oriented x 3.  SKIN: No obvious rash, lesion, or ulcer.  DATA REVIEW:   CBC Recent Labs  Lab 03/08/19 0528  WBC 8.3  HGB 16.2  HCT 49.2  PLT 186    Chemistries  Recent Labs  Lab 03/06/19 1644  03/08/19 0528  NA 142   < > 140  K 3.7   < > 3.7  CL 106   < > 104  CO2 24   < > 24  GLUCOSE 141*   < > 145*  BUN 19   < > 18  CREATININE 0.93   < > 0.91  CALCIUM 10.1   < > 9.5  AST 22  --   --   ALT 21  --   --   ALKPHOS 52  --   --   BILITOT 1.8*  --   --    < > = values in this interval not displayed.     Microbiology Results  Results for orders placed or performed in visit on 02/01/18  Microscopic Examination     Status: Abnormal   Collection Time: 02/01/18  8:39 AM  Result Value Ref Range Status   WBC, UA 0-5 0 - 5 /hpf Final   RBC, UA 3-10 (A) 0 - 2 /hpf Final   Epithelial Cells (non renal) 0-10 0 - 10 /hpf Final   Bacteria, UA Few (A) None seen/Few Final    RADIOLOGY:  No results found.   Management plans discussed with  the patient, family and they are in agreement.  CODE STATUS: Full Code   TOTAL TIME TAKING CARE OF THIS PATIENT: 38 minutes.    Davon Folta M.D on 03/08/2019 at 11:08 AM  Between 7am to 6pm - Pager - [phone]  After 6pm go to [email or web address] - Proofreader  [facility] [place] [facility]  Office  [phone]  CC: Primary care physician; Guadalupe Maple, MD   Note: This dictation was prepared with Dragon dictation along with smaller phrase technology. Any transcriptional errors that result from this process are unintentional.

## 2019-03-08 NOTE — Progress Notes (Signed)
Discharged to home.  His prescriptions were printed and given to him.  Instructed him to call his PCP, and Dr. Donivan Scull office on Monday to make follow up appointments.  Unable to make these appointments as it is Saturday

## 2019-03-10 ENCOUNTER — Telehealth: Payer: Self-pay | Admitting: Cardiovascular Disease

## 2019-03-10 ENCOUNTER — Encounter: Payer: Self-pay | Admitting: Internal Medicine

## 2019-03-10 NOTE — Telephone Encounter (Signed)
Patient request [facility] for chest pain .    He cannot get access to mychart but is ok with phone evisit.    Please advise.

## 2019-03-11 ENCOUNTER — Other Ambulatory Visit: Payer: Self-pay | Admitting: *Deleted

## 2019-03-11 NOTE — Patient Outreach (Signed)
[place] [facility]) Care Management  03/11/2019  Ernest [name] 12/23/1950 [id number]   Subjective: Telephone call to patient's home number (1st), no answer, left HIPAA compliant voicemail message, and requested call back.     Objective: Per KPN (Knowledge Performance Now, point of care tool) and chart review, patient hospitalized 03/06/2019 -03/08/2019 for ACS (acute coronary syndrome) and NSTEMI (non-ST elevated myocardial infarction).   Patient also has a history of COPD, diabetes, hypertension, hyperlipidemia, asthma, NSTEMI (non-ST elevated myocardial infarction), Non-obstructive CAD (coronary artery disease), and Permanent atrial fibrillation.    Assessment: Received [employer] EMMI General Discharge Red Flag Alert follow up referral on 03/11/2019.   Red Flag Trigger, Day #1, patient answered no to the following question: Know who to call about changes in condition?   [facility] EMMI follow up pending patient contact.      Plan: RNCM will send unsuccessful outreach letter, [facility] pamphlet, handout: Know Before You Go, will call patient for 2nd telephone outreach attempt within 4 business days, [facility] EMMI follow up, and proceed with case closure, within 10 business days if no return call.        Tyffani Foglesong H. Annia Friendly, BSN, [place] [facility] Telephonic CM Phone: [phone] Fax: [phone]

## 2019-03-12 ENCOUNTER — Encounter: Payer: Self-pay | Admitting: Family Medicine

## 2019-03-12 ENCOUNTER — Other Ambulatory Visit: Payer: Self-pay | Admitting: *Deleted

## 2019-03-12 ENCOUNTER — Ambulatory Visit (INDEPENDENT_AMBULATORY_CARE_PROVIDER_SITE_OTHER): Payer: Medicare Other | Admitting: Family Medicine

## 2019-03-12 DIAGNOSIS — E782 Mixed hyperlipidemia: Secondary | ICD-10-CM

## 2019-03-12 DIAGNOSIS — I249 Acute ischemic heart disease, unspecified: Secondary | ICD-10-CM | POA: Diagnosis not present

## 2019-03-12 DIAGNOSIS — I10 Essential (primary) hypertension: Secondary | ICD-10-CM

## 2019-03-12 MED ORDER — ATORVASTATIN CALCIUM 80 MG PO TABS: 80.0000 mg | ORAL_TABLET | Freq: Every day | ORAL | 3 refills | Status: DC

## 2019-03-12 MED ORDER — PANTOPRAZOLE SODIUM 40 MG PO TBEC: 40.0000 mg | DELAYED_RELEASE_TABLET | Freq: Every day | ORAL | 3 refills | Status: DC

## 2019-03-12 MED ORDER — CLOPIDOGREL BISULFATE 75 MG PO TABS: 75.0000 mg | ORAL_TABLET | Freq: Every day | ORAL | 1 refills | Status: DC

## 2019-03-12 NOTE — Assessment & Plan Note (Signed)
The current medical regimen is effective;  continue present plan and medications.  

## 2019-03-12 NOTE — Patient Outreach (Addendum)
[place] [facility]) Care Management  03/12/2019  Glenwood Revoir 1951-01-03 [id number]   Subjective: Telephone call to patient's  mobile number, spoke with patient, and HIPAA verified.  Discussed [place]  EMMI General Discharge Red Flag Alert follow up, patient voiced understanding, and is in agreement to follow up.   Patient states he is doing great, feels much better, remembers receiving EMMI automated call, and was not sure who to call for changes.  Discussed signs / symptoms to report to providers, reviewed discharge instructions to do list, verbally given contact numbers for [facility], Dr. Rockey Situ (cardiologist), patient voices understanding, and will call providers today to confirm follow up appointments.  Patient states he is waiting for return phone call today from primary MD (Dr. Golden Pop) regarding hospital follow up visit.  States he was recently started on blood thinner and is planning to ask provider for refills due to long term treatment.  Patient states he does not have any education material, EMMI follow up, care coordination, care management, disease monitoring, transportation, community resource, or pharmacy needs at this time. States he is very appreciative of the follow up.    Objective: Per KPN (Knowledge Performance Now, point of care tool) and chart review, patient hospitalized 03/06/2019 -03/08/2019 for ACS (acute coronary syndrome) and NSTEMI (non-ST elevated myocardial infarction).   Patient also has a history of COPD, diabetes, hypertension, hyperlipidemia, asthma, NSTEMI (non-ST elevated myocardial infarction), Non-obstructive CAD (coronary artery disease), and Permanent atrial fibrillation.    Assessment: Received [employer] EMMI General Discharge Red Flag Alert follow up referral on 03/11/2019.   Red Flag Trigger, Day #1, patient answered no to the following question: Know who to call about  changes in condition?   EMMI follow up completed and no further care management needs.       Plan: RNCM will complete case closure due to follow up completed / no care management needs.        Renai Lopata H. Annia Friendly, BSN, [place] [facility] Telephonic CM Phone: [phone] Fax: [phone]

## 2019-03-12 NOTE — Progress Notes (Signed)
There were no vitals taken for this visit.   Subjective:    Patient ID: Ernest [name], male    DOB: 11-03-1951, 68 y.o.   MRN: [id number]  HPI: Ernest [name] is a 68 y.o. male   Virtual Visit via Telephone Note Today's visit performed via telephone visit due to COVID-19 isolation precautions I connected with   on 03/ at  EDT by telephone and verified that I am speaking with the correct person using two identifiers.   I discussed the limitations, risks, security and privacy concerns of performing an evaluation and management service by telephone and the availability of in person appointments. I also discussed with the patient that there may be a patient responsible charge related to this service. The patient expressed understanding and agreed to proceed.  Transition of Care Hospital Follow up.   Hospital/Facility:[facility] D/C Physician: Stark Jock D/C Date: 03-08-19  Records Requested: na Records Received:  Records Reviewed: today  Diagnoses on Discharge: STEMI  Date of interactive Contact within 48 hours of discharge: Monday 22 Contact was through: phone  Date of 7 day or 14 day face-to-face visit:    7  Outpatient Encounter Medications as of 03/12/2019  Medication Sig  . albuterol (PROVENTIL HFA;VENTOLIN HFA) 108 (90 Base) MCG/ACT inhaler Inhale 2 puffs into the lungs every 4 (four) hours as needed for wheezing or shortness of breath.  Marland Kitchen atorvastatin (LIPITOR) 80 MG tablet Take 1 tablet (80 mg total) by mouth daily.  . budesonide-formoterol (SYMBICORT) 160-4.5 MCG/ACT inhaler Inhale 2 puffs into the lungs 2 (two) times daily.  . canagliflozin (INVOKANA) 300 MG TABS tablet Take 1 tablet (300 mg total) by mouth daily before breakfast.  . clopidogrel (PLAVIX) 75 MG tablet Take 1 tablet (75 mg total) by mouth daily with breakfast.  . diltiazem (CARDIZEM CD) 180 MG 24 hr capsule Take 1 capsule (180 mg total) by mouth daily.  . furosemide (LASIX) 40 MG tablet Take 0.5 tablets  (20 mg total) by mouth 2 (two) times daily.  Marland Kitchen gabapentin (NEURONTIN) 600 MG tablet Take 2 tablets (1,200 mg total) by mouth at bedtime.  Marland Kitchen glipiZIDE (GLUCOTROL) 5 MG tablet TAKE 1 TABLET BY MOUTH  DAILY BEFORE BREAKFAST  . isosorbide mononitrate (IMDUR) 60 MG 24 hr tablet Take 1 tablet (60 mg total) by mouth daily.  . metoprolol succinate (TOPROL-XL) 25 MG 24 hr tablet Take 1 tablet (25 mg total) by mouth daily.  . pantoprazole (PROTONIX) 40 MG tablet Take 1 tablet (40 mg total) by mouth daily.  . ramipril (ALTACE) 10 MG capsule Take 1 capsule (10 mg total) by mouth daily.  . rivaroxaban (XARELTO) 20 MG TABS tablet TAKE 1 TABLET(20 MG) BY MOUTH DAILY WITH SUPPER  . [DISCONTINUED] atorvastatin (LIPITOR) 80 MG tablet Take 1 tablet (80 mg total) by mouth daily.  . [DISCONTINUED] clopidogrel (PLAVIX) 75 MG tablet Take 1 tablet (75 mg total) by mouth daily with breakfast.  . [DISCONTINUED] omeprazole (PRILOSEC) 20 MG capsule Take 1 capsule (20 mg total) by mouth 2 (two) times daily before a meal.   No facility-administered encounter medications on file as of 03/12/2019.     Diagnostic Tests Reviewed/Disposition: Increased atorvastatin from 40 to 80 mg started Plavix and I discontinued omeprazole and started Protonix.  Consults:  Discharge Instructionsrevied with pt  Disease/illness Education: done  Home Health/Community Services Discussions/Referrals:na  Establishment or re-establishment of referral orders for community resources:na  Discussion with other health care providers:na  Assessment and Support of treatment regimen  adherence:na  Appointments Coordinated with: na  Education for self-management, independent living, and ADLs: na Did review Covid19 risk and modification for social distancing   Relevant past medical, surgical, family and social history reviewed and updated as indicated. Interim medical history since our last visit reviewed. Allergies and medications reviewed and  updated.  Review of Systems  Constitutional: Negative.   Respiratory: Negative.   Cardiovascular: Negative.     Per HPI unless specifically indicated above     Objective:    There were no vitals taken for this visit.  Wt Readings from Last 3 Encounters:  03/08/19 242 lb 12.8 oz (110.1 kg)  02/04/19 268 lb 4.8 oz (121.7 kg)  02/03/19 271 lb (122.9 kg)    Physical Exam none Results for orders placed or performed during the hospital encounter of 03/06/19  CBC  Result Value Ref Range   WBC 8.8 4.0 - 10.5 K/uL   RBC 5.40 4.22 - 5.81 MIL/uL   Hemoglobin 15.2 13.0 - 17.0 g/dL   HCT 45.1 39.0 - 52.0 %   MCV 83.5 80.0 - 100.0 fL   MCH 28.1 26.0 - 34.0 pg   MCHC 33.7 30.0 - 36.0 g/dL   RDW 14.0 11.5 - 15.5 %   Platelets 176 150 - 400 K/uL   nRBC 0.0 0.0 - 0.2 %  Comprehensive metabolic panel  Result Value Ref Range   Sodium 142 135 - 145 mmol/L   Potassium 3.7 3.5 - 5.1 mmol/L   Chloride 106 98 - 111 mmol/L   CO2 24 22 - 32 mmol/L   Glucose, Bld 141 (H) 70 - 99 mg/dL   BUN 19 8 - 23 mg/dL   Creatinine, Ser 0.93 0.61 - 1.24 mg/dL   Calcium 10.1 8.9 - 10.3 mg/dL   Total Protein 7.6 6.5 - 8.1 g/dL   Albumin 4.7 3.5 - 5.0 g/dL   AST 22 15 - 41 U/L   ALT 21 0 - 44 U/L   Alkaline Phosphatase 52 38 - 126 U/L   Total Bilirubin 1.8 (H) 0.3 - 1.2 mg/dL   GFR calc non Af Amer >60 >60 mL/min   GFR calc Af Amer >60 >60 mL/min   Anion gap 12 5 - 15  Troponin I - ONCE - STAT  Result Value Ref Range   Troponin I 0.07 (HH) <0.03 ng/mL  HIV antibody (Routine Testing)  Result Value Ref Range   HIV Screen 4th Generation wRfx Non Reactive Non Reactive  Basic metabolic panel  Result Value Ref Range   Sodium 143 135 - 145 mmol/L   Potassium 3.7 3.5 - 5.1 mmol/L   Chloride 108 98 - 111 mmol/L   CO2 26 22 - 32 mmol/L   Glucose, Bld 114 (H) 70 - 99 mg/dL   BUN 19 8 - 23 mg/dL   Creatinine, Ser 0.87 0.61 - 1.24 mg/dL   Calcium 9.4 8.9 - 10.3 mg/dL   GFR calc non Af Amer >60 >60 mL/min    GFR calc Af Amer >60 >60 mL/min   Anion gap 9 5 - 15  CBC  Result Value Ref Range   WBC 8.8 4.0 - 10.5 K/uL   RBC 5.37 4.22 - 5.81 MIL/uL   Hemoglobin 15.1 13.0 - 17.0 g/dL   HCT 46.1 39.0 - 52.0 %   MCV 85.8 80.0 - 100.0 fL   MCH 28.1 26.0 - 34.0 pg   MCHC 32.8 30.0 - 36.0 g/dL   RDW 14.0 11.5 - 15.5 %  Platelets 175 150 - 400 K/uL   nRBC 0.0 0.0 - 0.2 %  APTT  Result Value Ref Range   aPTT 33 24 - 36 seconds  Heparin level (unfractionated)  Result Value Ref Range   Heparin Unfractionated 0.56 0.30 - 0.70 IU/mL  APTT  Result Value Ref Range   aPTT 55 (H) 24 - 36 seconds  Heparin level (unfractionated)  Result Value Ref Range   Heparin Unfractionated 0.28 (L) 0.30 - 0.70 IU/mL  Glucose, capillary  Result Value Ref Range   Glucose-Capillary 100 (H) 70 - 99 mg/dL  Glucose, capillary  Result Value Ref Range   Glucose-Capillary 99 70 - 99 mg/dL  Troponin I - Now Then Q6H  Result Value Ref Range   Troponin I 0.37 (HH) <0.03 ng/mL  Troponin I - Now Then Q6H  Result Value Ref Range   Troponin I 0.22 (HH) <0.03 ng/mL  Troponin I - Now Then Q6H  Result Value Ref Range   Troponin I 0.23 (HH) <0.03 ng/mL  Glucose, capillary  Result Value Ref Range   Glucose-Capillary 123 (H) 70 - 99 mg/dL   Comment 1 Notify RN    Comment 2 Document in Chart   Glucose, capillary  Result Value Ref Range   Glucose-Capillary 124 (H) 70 - 99 mg/dL  APTT  Result Value Ref Range   aPTT 68 (H) 24 - 36 seconds  Glucose, capillary  Result Value Ref Range   Glucose-Capillary 157 (H) 70 - 99 mg/dL   Comment 1 Notify RN    Comment 2 Document in Chart   Basic metabolic panel  Result Value Ref Range   Sodium 140 135 - 145 mmol/L   Potassium 3.7 3.5 - 5.1 mmol/L   Chloride 104 98 - 111 mmol/L   CO2 24 22 - 32 mmol/L   Glucose, Bld 145 (H) 70 - 99 mg/dL   BUN 18 8 - 23 mg/dL   Creatinine, Ser 0.91 0.61 - 1.24 mg/dL   Calcium 9.5 8.9 - 10.3 mg/dL   GFR calc non Af Amer >60 >60 mL/min   GFR  calc Af Amer >60 >60 mL/min   Anion gap 12 5 - 15  CBC  Result Value Ref Range   WBC 8.3 4.0 - 10.5 K/uL   RBC 5.85 (H) 4.22 - 5.81 MIL/uL   Hemoglobin 16.2 13.0 - 17.0 g/dL   HCT 49.2 39.0 - 52.0 %   MCV 84.1 80.0 - 100.0 fL   MCH 27.7 26.0 - 34.0 pg   MCHC 32.9 30.0 - 36.0 g/dL   RDW 13.9 11.5 - 15.5 %   Platelets 186 150 - 400 K/uL   nRBC 0.0 0.0 - 0.2 %  Glucose, capillary  Result Value Ref Range   Glucose-Capillary 166 (H) 70 - 99 mg/dL  APTT  Result Value Ref Range   aPTT 73 (H) 24 - 36 seconds  Heparin level (unfractionated)  Result Value Ref Range   Heparin Unfractionated 0.30 0.30 - 0.70 IU/mL  Glucose, capillary  Result Value Ref Range   Glucose-Capillary 154 (H) 70 - 99 mg/dL  Glucose, capillary  Result Value Ref Range   Glucose-Capillary 128 (H) 70 - 99 mg/dL  POCT Activated clotting time  Result Value Ref Range   Activated Clotting Time 301 seconds  POCT Activated clotting time  Result Value Ref Range   Activated Clotting Time 296 seconds  ECHOCARDIOGRAM COMPLETE  Result Value Ref Range   Weight 3,936 oz   Height 70  in   BP 132/95 mmHg      Assessment & Plan:   Problem List Items Addressed This Visit      Cardiovascular and Mediastinum   Essential hypertension    The current medical regimen is effective;  continue present plan and medications.       Relevant Medications   atorvastatin (LIPITOR) 80 MG tablet   ACS (acute coronary syndrome) (HCC)    The current medical regimen is effective;  continue present plan and medications.       Relevant Medications   atorvastatin (LIPITOR) 80 MG tablet     Other   Hyperlipidemia    The current medical regimen is effective;  continue present plan and medications.       Relevant Medications   atorvastatin (LIPITOR) 80 MG tablet      I discussed the assessment and treatment plan with the patient. The patient was provided an opportunity to ask questions and all were answered. The patient agreed  with the plan and demonstrated an understanding of the instructions.   The patient was advised to call back or seek an in-person evaluation if the symptoms worsen or if the condition fails to improve as anticipated.   I provided 21+ minutes of non-face-to-face time during this encounter. Follow up plan: Return in about 2 months (around 05/12/2019) for Hemoglobin A1c.

## 2019-03-12 NOTE — Telephone Encounter (Signed)
Please call patient to schedule for evisit with Dr End in 1 week. Thanks!

## 2019-03-12 NOTE — Telephone Encounter (Signed)
I think an e-visit is fine about about 1-2 weeks after hospital d/c.  Either Dr. Rockey Situ or I can do it.  Thanks.  Nelva Bush, MD [facility] Pager: [phone]

## 2019-03-12 NOTE — Telephone Encounter (Signed)
It appears patient has seen Dr Saunders Revel and Rockey Situ in the past.  Patient had cath by Dr End on 03/07/19.   Routing to Dr End for advice: if e-visit (telephone or video) is ok?

## 2019-03-17 NOTE — Progress Notes (Addendum)
Virtual Visit via Telephone Note   This visit type was conducted due to national recommendations for restrictions regarding the COVID-19 Pandemic (e.g. social distancing) in an effort to limit this patient's exposure and mitigate transmission in our community.  Due to her co-morbid illnesses, this patient is at least at moderate risk for complications without adequate follow up.  This format is felt to be most appropriate for this patient at this time.  The patient did not have access to video technology/had technical difficulties with video requiring transitioning to audio format only (telephone).  All issues noted in this document were discussed and addressed.  No physical exam could be performed with this format.  Please refer to the patient's chart for her  consent to telehealth for [facility].    Date:  03/17/2019   ID:  Ernest [name], DOB [birth year], MRN [id number]  Patient Location:  [address] [place] [address]   Provider location:   [facility], [place] office  PCP:  Ernest Maple, MD  Cardiologist:  Ernest Rogue, MD   Chief Complaint:      History of Present Illness:    Ernest [name] is a 68 y.o. male who presents via audio/video conferencing for a telehealth visit today.   The patient does not symptoms concerning for COVID-19 infection (fever, chills, cough, or new SHORTNESS OF BREATH).  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 The cavity size was normal. There is moderately increased left ventricular wall thickness. Left ventricular diastolic function could not be evaluated secondary to  atrial fibrillation.  2. The right ventricle has normal systolic function. The cavity was normal. There is no increase in right ventricular wall thickness.  3. Left atrial size was mild-moderately dilated.  On plavix with xarelto No bleeding Would like to go back to work, needs a work release note Denies any chest pain or shortness of breath on exertion   Prior CV studies:   The following studies were reviewed today:   Cath 03/07/19 1. Severe single-vessel CAD with 80% mid LAD stenosis. 2. Non-obstructive LMCA, LCx, and RCA disease. 3. Upper normal left ventricular filling pressure with normal ejection fraction. 4. Mild to moderate mitral regurgitation in the setting of catheter-induced non-sustained ventricular tachycardia. 5. Successful PCI to mid LAD using Synergy 2.5 x 24 mm drug-eluting stent (post-dilated to 2.8 mm) with 0% residual stenosis and TIMI-3 flow.    Past Medical History:  Diagnosis Date  . Asthma   . COPD (chronic obstructive pulmonary disease) ([place])   . Diabetes mellitus    Type II  . Early Pulmonary fibrosis ([place])   . GERD (gastroesophageal reflux disease)   . History of echocardiogram    a. 02/2017 Echo: EF 60-65%, no rwma, mild MR, mod dil LA. Nl RV fxn. PASP 97mmHg.  Marland Kitchen  Hyperlipidemia   . Hypertension   . Morbid obesity ([place])   . Non-obstructive CAD (coronary artery disease)    a. 2011 Cath: nonobs dzs; b. 07/2014 Cath: LM 30, LAD nl, LCX nl, RCA 20p, 48m.  Marland Kitchen Permanent atrial fibrillation    a. CHA2DS2VASc = 4-->xarelto.   Past  Surgical History:  Procedure Laterality Date  . ANKLE SURGERY    . CARDIAC CATHETERIZATION  05/2010   [facility]  . CARDIAC CATHETERIZATION     [facility]  . CARDIAC CATHETERIZATION     [facility]  . CARDIAC CATHETERIZATION  07/2014   [facility]  . COLONOSCOPY    . COLONOSCOPY WITH PROPOFOL N/A 06/26/2018   Procedure: COLONOSCOPY WITH PROPOFOL;  Surgeon: Ernest Manifold, MD;  Location: [facility] ENDOSCOPY;  Service: Endoscopy;  Laterality: N/A;  . CORONARY STENT INTERVENTION N/A 03/07/2019   Procedure: CORONARY STENT INTERVENTION;  Surgeon: Ernest Bush, MD;  Location: [facility];  Service: Cardiovascular;  Laterality: N/A;  . ENTEROSCOPY N/A 08/05/2018   Procedure: ENTEROSCOPY;  Surgeon: Ernest Manifold, MD;  Location: [facility] ENDOSCOPY;  Service: Endoscopy;  Laterality: N/A;  . ESOPHAGOGASTRODUODENOSCOPY (EGD) WITH PROPOFOL N/A 06/26/2018   Procedure: ESOPHAGOGASTRODUODENOSCOPY (EGD) WITH PROPOFOL;  Surgeon: Ernest Manifold, MD;  Location: [facility] ENDOSCOPY;  Service: Endoscopy;  Laterality: N/A;  . LEFT HEART CATH AND CORONARY ANGIOGRAPHY N/A 03/07/2019   Procedure: LEFT HEART CATH AND CORONARY ANGIOGRAPHY;  Surgeon: Ernest Bush, MD;  Location: [facility];  Service: Cardiovascular;  Laterality: N/A;  . TESTICLE SURGERY  70's  . TUMOR EXCISION     Neck and finger; benign  . WRIST SURGERY       No outpatient medications have been marked as taking for the 03/18/19 encounter (Appointment) with Ernest Merritts, MD.     Allergies:   Doxycycline   Social History   Tobacco Use  . Smoking status: Former Smoker    Packs/day: 4.00    Years: 28.00    Pack years: 112.00    Types: Cigarettes    Last attempt to quit: 08/25/1993    Years since quitting: 25.5  . Smokeless tobacco: Never Used  Substance Use Topics  . Alcohol use: Not Currently    Comment: rarely, occasional beer   . Drug use: No     Current Outpatient Medications on File Prior to Visit  Medication Sig Dispense  Refill  . albuterol (PROVENTIL HFA;VENTOLIN HFA) 108 (90 Base) MCG/ACT inhaler Inhale 2 puffs into the lungs every 4 (four) hours as needed for wheezing or shortness of breath. 1 Inhaler 6  . atorvastatin (LIPITOR) 80 MG tablet Take 1 tablet (80 mg total) by mouth daily. 90 tablet 3  . budesonide-formoterol (SYMBICORT) 160-4.5 MCG/ACT inhaler Inhale 2 puffs into the lungs 2 (two) times daily. 1 Inhaler 12  . canagliflozin (INVOKANA) 300 MG TABS tablet Take 1 tablet (300 mg total) by mouth daily before breakfast. 30 tablet 3  . clopidogrel (PLAVIX) 75 MG tablet Take 1 tablet (75 mg total) by mouth daily with breakfast. 90 tablet 1  . diltiazem (CARDIZEM CD) 180 MG 24 hr capsule Take 1 capsule (180 mg total) by mouth daily. 90 capsule 3  . furosemide (LASIX) 40 MG tablet Take 0.5 tablets (20 mg total) by mouth 2 (two) times daily. 180 tablet 4  . gabapentin (NEURONTIN) 600 MG tablet Take 2 tablets (1,200 mg total) by mouth at bedtime. 180 tablet 4  . glipiZIDE (GLUCOTROL) 5 MG tablet TAKE 1 TABLET BY MOUTH  DAILY BEFORE BREAKFAST 90 tablet 4  . isosorbide mononitrate (IMDUR) 60 MG 24 hr tablet Take 1 tablet (60 mg total) by mouth daily. 90 tablet 0  . metoprolol succinate (TOPROL-XL) 25 MG 24 hr tablet Take 1 tablet (25 mg total) by mouth daily. 30 tablet 3  . pantoprazole (PROTONIX) 40 MG tablet Take 1 tablet (40 mg total) by mouth daily. 90 tablet 3  . ramipril (ALTACE) 10 MG capsule Take 1 capsule (10 mg total) by mouth daily. 30 capsule 3  . rivaroxaban (XARELTO) 20 MG TABS tablet TAKE 1 TABLET(20 MG) BY MOUTH DAILY WITH SUPPER 30 tablet 1   No current facility-administered medications on file prior to visit.      Family Hx: The patient's family history includes Alcohol abuse in an other family member; Cancer in his brother; Coronary artery disease in an other family member; Depression in an other family member; Diabetes in his mother and another family member; Heart disease in his mother;  Hyperlipidemia in his mother and another family member; Hypertension in his mother and another family member; Kidney Stones in his brother; Pneumonia in his father.  ROS:   Please see the history of present illness.    Review of Systems  Constitutional: Negative.   Respiratory: Negative.   Cardiovascular: Negative.   Gastrointestinal: Negative.   Musculoskeletal: Negative.   Neurological: Negative.   Psychiatric/Behavioral: Negative.   All other systems reviewed and are negative.    Labs/Other Tests and Data Reviewed:    Recent Labs: 03/06/2019: ALT 21 03/08/2019: BUN 18; Creatinine, Ser 0.91; Hemoglobin 16.2; Platelets 186; Potassium 3.7; Sodium 140   Recent Lipid Panel Lab Results  Component Value Date/Time   CHOL 209 (H) 01/27/2019 01:25 PM   CHOL 195 08/01/2017 09:02 AM   CHOL 104 07/25/2014 04:25 AM   TRIG 340 (H) 01/27/2019 01:25 PM   TRIG 268 (H) 08/01/2017 09:02 AM   TRIG 243 (H) 07/25/2014 04:25 AM   TRIG 249 05/27/2010   HDL 30 (L) 01/27/2019 01:25 PM   HDL 25 (L) 07/25/2014 04:25 AM   CHOLHDL 3.7 01/30/2017 02:10 PM   LDLCALC 111 (H) 01/27/2019 01:25 PM   LDLCALC 30 07/25/2014 04:25 AM    Wt Readings from Last 3 Encounters:  03/08/19 242 lb 12.8 oz (110.1 kg)  02/04/19 268 lb 4.8 oz (121.7 kg)  02/03/19 271 lb (122.9 kg)     Exam:    Vital Signs:  There were no vitals taken for this visit.   Well nourished, well developed male in no acute distress.   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 (HCC) Currently with no symptoms of angina. No further workup at this time. Continue current medication regimen. stable  Hyperlipidemia, unspecified hyperlipidemia type Stay on lipitor Discussed that goal LDL less than 70 Most recent numbers well above goal   COVID-19 Education: The signs and symptoms of COVID-19 were discussed with the patient and how to seek care for testing (follow up with PCP or arrange E-visit).  The  importance of social distancing was discussed today.  Patient Risk:   After full review of this patients clinical status, I feel that they are at least moderate risk at this time.  Time:   Today, I have spent 25 minutes with the patient with telehealth technology discussing recent cardiac catheterization results, stent placed, recovery, diabetes control, cholesterol control, need for weight loss.     Medication Adjustments/Labs and Tests Ordered: Current medicines are reviewed at length with the patient today.  Concerns regarding medicines are outlined above.   Tests Ordered: No tests ordered   Medication Changes: No changes made   Disposition: Follow-up in 6 months   Signed, Ernest Rogue, MD  03/17/2019 5:21 PM    [place] Office [address], [place], [place] [address]

## 2019-03-18 ENCOUNTER — Telehealth (INDEPENDENT_AMBULATORY_CARE_PROVIDER_SITE_OTHER): Payer: Medicare Other | Admitting: Cardiovascular Disease

## 2019-03-18 ENCOUNTER — Telehealth: Payer: Self-pay | Admitting: Cardiovascular Disease

## 2019-03-18 ENCOUNTER — Encounter: Payer: Self-pay | Admitting: *Deleted

## 2019-03-18 ENCOUNTER — Other Ambulatory Visit: Payer: Self-pay

## 2019-03-18 DIAGNOSIS — I214 Non-ST elevation (NSTEMI) myocardial infarction: Secondary | ICD-10-CM | POA: Diagnosis not present

## 2019-03-18 DIAGNOSIS — I129 Hypertensive chronic kidney disease with stage 1 through stage 4 chronic kidney disease, or unspecified chronic kidney disease: Secondary | ICD-10-CM

## 2019-03-18 DIAGNOSIS — I25111 Atherosclerotic heart disease of native coronary artery with angina pectoris with documented spasm: Secondary | ICD-10-CM

## 2019-03-18 DIAGNOSIS — E785 Hyperlipidemia, unspecified: Secondary | ICD-10-CM

## 2019-03-18 DIAGNOSIS — I4821 Permanent atrial fibrillation: Secondary | ICD-10-CM

## 2019-03-18 DIAGNOSIS — E1122 Type 2 diabetes mellitus with diabetic chronic kidney disease: Secondary | ICD-10-CM

## 2019-03-18 DIAGNOSIS — N183 Chronic kidney disease, stage 3 (moderate): Secondary | ICD-10-CM

## 2019-03-18 MED ORDER — ISOSORBIDE MONONITRATE ER 60 MG PO TB24: 60.0000 mg | ORAL_TABLET | Freq: Every day | ORAL | 3 refills | Status: DC

## 2019-03-18 NOTE — Telephone Encounter (Signed)
[name] I received this message and working on it.

## 2019-03-18 NOTE — Addendum Note (Signed)
Addended by: Valora Corporal on: 03/18/2019 12:18 PM   Modules accepted: Orders

## 2019-03-18 NOTE — Patient Instructions (Addendum)
Please call when you have the fax number for work We can send a "return to work" to work address (fax)  Need refill on imdur   Medication Instructions:  No changes  If you need a refill on your cardiac medications before your next appointment, please call your pharmacy.   Lab work: No new labs needed   If you have labs (blood work) drawn today and your tests are completely normal, you will receive your results only by: Marland Kitchen MyChart Message (if you have MyChart) OR . A paper copy in the mail If you have any lab test that is abnormal or we need to change your treatment, we will call you to review the results.   Testing/Procedures: No new testing needed   Follow-Up: At [facility], you and your health needs are our priority.  As part of our continuing mission to provide you with exceptional heart care, we have created designated Provider Care Teams.  These Care Teams include your primary Cardiologist (physician) and Advanced Practice Providers (APPs -  Physician Assistants and Nurse Practitioners) who all work together to provide you with the care you need, when you need it.  . You will need a follow up appointment in 6 months .   Please call our office 2 months in advance to schedule this appointment.    . Providers on your designated Care Team:   . Murray Hodgkins, NP . Christell Faith, PA-C . Marrianne Mood, PA-C  Any Other Special Instructions Will Be Listed Below (If Applicable).  For educational health videos Log in to : [email or web address] Or : [email or web address], password : [email or web address]

## 2019-03-18 NOTE — Telephone Encounter (Signed)
Letter completed and routed to [facility] for provider in office to sign and then fax. Please let me know when done and I will call patient to let him know.

## 2019-03-18 NOTE — Telephone Encounter (Signed)
Pt needs a note state ing he can go back to work.

## 2019-03-18 NOTE — Telephone Encounter (Signed)
Left message that I am working on his letter to return to work and would be in touch when it is ready for pick up.

## 2019-03-18 NOTE — Telephone Encounter (Signed)
Letter printed, signed by Dr. Rockey Situ, and faxed to [facility] at [phone].  Confirmation received.

## 2019-03-18 NOTE — Telephone Encounter (Signed)
Spoke with patient and reviewed that I received order for letter but I need the fax where to send it per Dr. Donivan Scull request. Patient provided fax number [phone] to attention Tracey. Waiting on provider to reply if any restrictions and will get this done and have someone in office to fax to number and name listed.

## 2019-03-18 NOTE — Telephone Encounter (Signed)
Patient had teleHealth visit with Dr Rockey Situ today. Routing to him to advise on return to work note.

## 2019-03-20 NOTE — Telephone Encounter (Signed)
Spoke with patient and reviewed that letter was faxed and he was appreciative for the call back and follow up on this being done. No further questions at this time.

## 2019-03-27 ENCOUNTER — Other Ambulatory Visit: Payer: Self-pay | Admitting: Family Medicine

## 2019-03-27 DIAGNOSIS — I482 Chronic atrial fibrillation, unspecified: Secondary | ICD-10-CM

## 2019-04-04 ENCOUNTER — Other Ambulatory Visit: Payer: Self-pay | Admitting: Family Medicine

## 2019-04-04 NOTE — Telephone Encounter (Signed)
Called pharmacy on file and there are no refills Last rx was 03/12/19 for 6 months worth Refilling for 6 months Requested Prescriptions  Pending Prescriptions Disp Refills  . clopidogrel (PLAVIX) 75 MG tablet [Pharmacy Med Name: CLOPIDOGREL 75MG  TABLETS] 90 tablet 1    Sig: TAKE 1 TABLET BY MOUTH DAILY WITH BREAKFAST     Hematology: Antiplatelets - clopidogrel Failed - 04/04/2019  1:14 PM      Failed - Evaluate AST, ALT within 2 months of therapy initiation.      Passed - ALT in normal range and within 360 days    ALT  Date Value Ref Range Status  03/06/2019 21 0 - 44 U/L Final   ALT (SGPT) Piccolo, Waived  Date Value Ref Range Status  08/01/2017 33 10 - 47 U/L Final         Passed - AST in normal range and within 360 days    AST  Date Value Ref Range Status  03/06/2019 22 15 - 41 U/L Final   AST (SGOT) Piccolo, Waived  Date Value Ref Range Status  08/01/2017 34 11 - 38 U/L Final         Passed - HCT in normal range and within 180 days    HCT  Date Value Ref Range Status  03/08/2019 49.2 39.0 - 52.0 % Final   Hematocrit  Date Value Ref Range Status  02/21/2018 40.0 37.5 - 51.0 % Final         Passed - HGB in normal range and within 180 days    Hemoglobin  Date Value Ref Range Status  03/08/2019 16.2 13.0 - 17.0 g/dL Final  02/21/2018 12.9 (L) 13.0 - 17.7 g/dL Final         Passed - PLT in normal range and within 180 days    Platelets  Date Value Ref Range Status  03/08/2019 186 150 - 400 K/uL Final  02/21/2018 227 150 - 379 x10E3/uL Final         Passed - Valid encounter within last 6 months    Recent Outpatient Visits          3 weeks ago ACS (acute coronary syndrome) [facility])   [facility], Jeannette How, MD   1 month ago History of sleep apnea   [place], [place], [place]   2 months ago Permanent atrial fibrillation   [place], [employer], [place]   9 months ago Allergic contact dermatitis  due to plants, except food   [employer], Megan P, DO   10 months ago Essential hypertension   [place], Jeannette How, MD      Future Appointments            In 1 month Crissman, Jeannette How, MD [place], [facility]   In 10 months  [employer], [facility]

## 2019-04-27 ENCOUNTER — Other Ambulatory Visit: Payer: Self-pay | Admitting: Family Medicine

## 2019-04-27 DIAGNOSIS — I10 Essential (primary) hypertension: Secondary | ICD-10-CM

## 2019-04-28 ENCOUNTER — Other Ambulatory Visit: Payer: Self-pay | Admitting: Family Medicine

## 2019-05-06 ENCOUNTER — Other Ambulatory Visit: Payer: Self-pay

## 2019-05-06 ENCOUNTER — Inpatient Hospital Stay: Payer: Medicare Other | Attending: Oncology

## 2019-05-06 DIAGNOSIS — D509 Iron deficiency anemia, unspecified: Secondary | ICD-10-CM | POA: Insufficient documentation

## 2019-05-06 LAB — FERRITIN: Ferritin: 56 ng/mL (ref 24–336)

## 2019-05-06 LAB — CBC
HCT: 42.4 % (ref 39.0–52.0)
Hemoglobin: 13.9 g/dL (ref 13.0–17.0)
MCH: 27.7 pg (ref 26.0–34.0)
MCHC: 32.8 g/dL (ref 30.0–36.0)
MCV: 84.5 fL (ref 80.0–100.0)
Platelets: 152 10*3/uL (ref 150–400)
RBC: 5.02 MIL/uL (ref 4.22–5.81)
RDW: 13.2 % (ref 11.5–15.5)
WBC: 6.3 10*3/uL (ref 4.0–10.5)
nRBC: 0 % (ref 0.0–0.2)

## 2019-05-06 LAB — IRON AND TIBC
Iron: 63 ug/dL (ref 45–182)
Saturation Ratios: 16 % — ABNORMAL LOW (ref 17.9–39.5)
TIBC: 394 ug/dL (ref 250–450)
UIBC: 331 ug/dL

## 2019-05-13 ENCOUNTER — Other Ambulatory Visit: Payer: Self-pay | Admitting: Family Medicine

## 2019-05-26 ENCOUNTER — Other Ambulatory Visit: Payer: Self-pay | Admitting: Family Medicine

## 2019-05-26 DIAGNOSIS — I482 Chronic atrial fibrillation, unspecified: Secondary | ICD-10-CM

## 2019-05-27 ENCOUNTER — Other Ambulatory Visit: Payer: Self-pay

## 2019-05-27 ENCOUNTER — Ambulatory Visit (INDEPENDENT_AMBULATORY_CARE_PROVIDER_SITE_OTHER): Payer: Medicare Other | Admitting: Family Medicine

## 2019-05-27 ENCOUNTER — Encounter: Payer: Self-pay | Admitting: Family Medicine

## 2019-05-27 DIAGNOSIS — N183 Chronic kidney disease, stage 3 (moderate): Secondary | ICD-10-CM | POA: Diagnosis not present

## 2019-05-27 DIAGNOSIS — E1122 Type 2 diabetes mellitus with diabetic chronic kidney disease: Secondary | ICD-10-CM | POA: Diagnosis not present

## 2019-05-27 DIAGNOSIS — I10 Essential (primary) hypertension: Secondary | ICD-10-CM | POA: Diagnosis not present

## 2019-05-27 DIAGNOSIS — I129 Hypertensive chronic kidney disease with stage 1 through stage 4 chronic kidney disease, or unspecified chronic kidney disease: Secondary | ICD-10-CM | POA: Diagnosis not present

## 2019-05-27 DIAGNOSIS — I4821 Permanent atrial fibrillation: Secondary | ICD-10-CM

## 2019-05-27 MED ORDER — CANAGLIFLOZIN 300 MG PO TABS: 300.0000 mg | ORAL_TABLET | Freq: Every day | ORAL | 3 refills | Status: DC

## 2019-05-27 MED ORDER — ATORVASTATIN CALCIUM 80 MG PO TABS: 80.0000 mg | ORAL_TABLET | Freq: Every day | ORAL | 2 refills | Status: DC

## 2019-05-27 NOTE — Assessment & Plan Note (Signed)
Pending labs and discussed medications compliance

## 2019-05-27 NOTE — Assessment & Plan Note (Signed)
The current medical regimen is effective;  continue present plan and medications.  

## 2019-05-27 NOTE — Progress Notes (Signed)
There were no vitals taken for this visit.   Subjective:    Patient ID: Ernest [name], male    DOB: [birth year], 68 y.o.   MRN: [id number]  HPI: Ernest [name] is a 68 y.o. male  Med check   Telemedicine using audio/video telecommunications for a synchronous communication visit. Today's visit due to COVID-19 isolation precautions I connected with and verified that I am speaking with the correct person using two identifiers.   I discussed the limitations, risks, security and privacy concerns of performing an evaluation and management service by telecommunication and the availability of in person appointments. I also discussed with the patient that there may be a patient responsible charge related to this service. The patient expressed understanding and agreed to proceed. The patient's location is home. I am at home.   Discussed with patient medications and is questionable whether patient is taking Invokana or not.  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  Patient will come in to get hemoglobin A1c and BMP.  Taking other medications without problems or issues. Cardiac status is stable taking Xarelto without bleeding or bruising discussed cost of medication patient will investigate if his insurance prefers other medication.  Relevant past medical, surgical, family and social history reviewed and updated as indicated. Interim medical history since our last visit reviewed. Allergies and medications reviewed and updated.  Review of Systems  Constitutional: Negative.   Respiratory: Negative.   Cardiovascular: Negative.     Per HPI unless specifically indicated above     Objective:    There were no vitals taken for this visit.  Wt Readings from Last 3 Encounters:  03/08/19 242 lb 12.8 oz (110.1 kg)  02/04/19 268 lb 4.8 oz (121.7 kg)  02/03/19 271 lb (122.9 kg)    Physical Exam  Results for orders placed or performed in visit on 05/06/19  Iron and TIBC  Result  Value Ref Range   Iron 63 45 - 182 ug/dL   TIBC 394 250 - 450 ug/dL   Saturation Ratios 16 (L) 17.9 - 39.5 %   UIBC 331 ug/dL  Ferritin  Result Value Ref Range   Ferritin 56 24 - 336 ng/mL  CBC  Result Value Ref Range   WBC 6.3 4.0 - 10.5 K/uL   RBC 5.02 4.22 - 5.81 MIL/uL   Hemoglobin 13.9 13.0 - 17.0 g/dL   HCT 42.4 39.0 - 52.0 %   MCV 84.5 80.0 - 100.0 fL   MCH 27.7 26.0 - 34.0 pg   MCHC 32.8 30.0 - 36.0 g/dL   RDW 13.2 11.5 - 15.5 %   Platelets 152 150 - 400 K/uL   nRBC 0.0 0.0 - 0.2 %      Assessment & Plan:   Problem List Items Addressed This Visit      Cardiovascular and Mediastinum   Atrial fibrillation (HCC)    The current medical regimen is effective;  continue present plan and medications.       Relevant Medications   atorvastatin (LIPITOR) 80 MG tablet   Essential hypertension - Primary    The current medical regimen is effective;  continue present plan and medications.       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 The patient was provided an opportunity to ask questions and all were answered. The patient agreed with the plan and demonstrated an understanding of the instructions.   The patient was advised to call back or seek an in-person evaluation if the symptoms worsen or if the condition fails to improve as anticipated.   I provided 21+ minutes of time during this encounter.   Follow up plan: Return in about 3 months (around 08/27/2019) for Hemoglobin A1c, BMP,  Lipids, ALT, AST.

## 2019-06-02 ENCOUNTER — Other Ambulatory Visit: Payer: Self-pay

## 2019-06-02 ENCOUNTER — Other Ambulatory Visit: Payer: Medicare Other

## 2019-06-02 DIAGNOSIS — E1122 Type 2 diabetes mellitus with diabetic chronic kidney disease: Secondary | ICD-10-CM

## 2019-06-02 DIAGNOSIS — I10 Essential (primary) hypertension: Secondary | ICD-10-CM

## 2019-06-02 DIAGNOSIS — I129 Hypertensive chronic kidney disease with stage 1 through stage 4 chronic kidney disease, or unspecified chronic kidney disease: Secondary | ICD-10-CM | POA: Diagnosis not present

## 2019-06-02 LAB — BAYER DCA HB A1C WAIVED: HB A1C (BAYER DCA - WAIVED): 6.8 % (ref <7.0)

## 2019-06-03 LAB — BASIC METABOLIC PANEL
BUN/Creatinine Ratio: 20 (ref 10–24)
BUN: 20 mg/dL (ref 8–27)
CO2: 23 mmol/L (ref 20–29)
Calcium: 9.2 mg/dL (ref 8.6–10.2)
Chloride: 107 mmol/L — ABNORMAL HIGH (ref 96–106)
Creatinine, Ser: 1.02 mg/dL (ref 0.76–1.27)
GFR calc Af Amer: 86 mL/min/{1.73_m2} (ref >59)
GFR calc non Af Amer: 75 mL/min/{1.73_m2} (ref >59)
Glucose: 146 mg/dL — ABNORMAL HIGH (ref 65–99)
Potassium: 4.5 mmol/L (ref 3.5–5.2)
Sodium: 145 mmol/L — ABNORMAL HIGH (ref 134–144)

## 2019-06-04 ENCOUNTER — Telehealth: Payer: Self-pay | Admitting: Cardiovascular Disease

## 2019-06-04 NOTE — Telephone Encounter (Signed)
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

## 2019-06-04 NOTE — Telephone Encounter (Signed)
Patient would also like this to be faxed to [facility]

## 2019-06-04 NOTE — Telephone Encounter (Signed)
Okay to write letter for DOT

## 2019-06-05 NOTE — Telephone Encounter (Signed)
Spoke with patient and he got all information he needed and has no further questions at this time.

## 2019-07-31 ENCOUNTER — Telehealth: Payer: Self-pay | Admitting: Family Medicine

## 2019-07-31 NOTE — Chronic Care Management (AMB) (Signed)
°  Chronic Care Management   Outreach Note  07/31/2019 Name: Ernest [name] MRN: [id number] DOB: 1951-01-09  Referred by: Guadalupe Maple, MD Reason for referral : Chronic Care Management (Initial CCM outreach was unsuccessful.)   An unsuccessful telephone outreach was attempted today. The patient was referred to the case management team by for assistance with chronic care management and care coordination.   Follow Up Plan: A HIPPA compliant phone message was left for the patient providing contact information and requesting a return call.  The care management team will reach out to the patient again over the next 7 days.  If patient returns call to provider office, please advise to call [place] at [place]  [email or web address]   ??[phone]

## 2019-08-05 NOTE — Chronic Care Management (AMB) (Signed)
°  Chronic Care Management   Outreach Note  08/05/2019 Name: Ernest [name] MRN: [id number] DOB: [birth year]  Referred by: Guadalupe Maple, MD Reason for referral : Chronic Care Management (Initial CCM outreach was unsuccessful.) and Chronic Care Management (Second CCM was unsuccessful.)   A second unsuccessful telephone outreach was attempted today. The patient was referred to the case management team for assistance with chronic care management and care coordination.   Follow Up Plan: A HIPPA compliant phone message was left for the patient providing contact information and requesting a return call.  The care management team will reach out to the patient again over the next 7 days.  If patient returns call to provider office, please advise to call [place] at [place]  [email or web address]   ??[phone]

## 2019-08-14 NOTE — Chronic Care Management (AMB) (Signed)
°  Chronic Care Management   Outreach Note  08/14/2019 Name: Ernest [name] MRN: [id number] DOB: [birth year]  Referred by: Guadalupe Maple, MD Reason for referral : Chronic Care Management (Initial CCM outreach was unsuccessful.), Chronic Care Management (Second CCM was unsuccessful.), and Chronic Care Management (Third CCM outreach was unsuccessful. )   Third unsuccessful telephone outreach was attempted today. The patient was referred to the case management team for assistance with chronic care management and care coordination. The patient's primary care provider has been notified of our unsuccessful attempts to make or maintain contact with the patient. The care management team is pleased to engage with this patient at any time in the future should he/she be interested in assistance from the care management team.   Follow Up Plan: The care management team is available to follow up with the patient after provider conversation with the patient regarding recommendation for care management engagement and subsequent re-referral to the care management team.   [place]  [email or web address]   ??[phone]

## 2019-08-26 ENCOUNTER — Other Ambulatory Visit: Payer: Self-pay

## 2019-08-26 MED ORDER — DILTIAZEM HCL ER COATED BEADS 180 MG PO CP24: 180.0000 mg | ORAL_CAPSULE | Freq: Every day | ORAL | 3 refills | Status: DC

## 2019-09-09 ENCOUNTER — Inpatient Hospital Stay: Payer: Medicare Other | Attending: Oncology

## 2019-09-09 ENCOUNTER — Inpatient Hospital Stay: Payer: Medicare Other | Admitting: Oncology

## 2019-09-15 NOTE — Chronic Care Management (AMB) (Signed)
Chronic Care Management   Note  09/15/2019 Name: Ernest [name] MRN: [id number] DOB: 05/05/1951  Ernest [name] is a 68 y.o. year old male who is a primary care patient of Crissman, Jeannette How, MD. I reached out to [name] by phone today in response to a referral sent by Ernest [name]'s patient's health plan.     Mr. Miyamoto was given information about Chronic Care Management services today including:  1. CCM service includes personalized support from designated clinical staff supervised by his physician, including individualized plan of care and coordination with other care providers 2. 24/7 contact phone numbers for assistance for urgent and routine care needs. 3. Service will only be billed when office clinical staff spend 20 minutes or more in a month to coordinate care. 4. Only one practitioner may furnish and bill the service in a calendar month. 5. The patient may stop CCM services at any time (effective at the end of the month) by phone call to the office staff. 6. The patient will be responsible for cost sharing (co-pay) of up to 20% of the service fee (after annual deductible is met).  Patient agreed to services and verbal consent obtained.   Follow up plan: Telephone appointment with CCM team member scheduled for: 11/25/2019  [place]  [email or web address]   ??[phone]

## 2019-09-29 ENCOUNTER — Other Ambulatory Visit: Payer: Self-pay

## 2019-09-29 MED ORDER — CLOPIDOGREL BISULFATE 75 MG PO TABS: 75.0000 mg | ORAL_TABLET | Freq: Every day | ORAL | 0 refills | Status: DC

## 2019-09-29 NOTE — Telephone Encounter (Signed)
Patient last seen 05/27/19.

## 2019-10-09 ENCOUNTER — Telehealth: Payer: Self-pay | Admitting: Oncology

## 2019-10-09 NOTE — Telephone Encounter (Signed)
LM to try to R/S NS appointment-ltg

## 2019-10-20 ENCOUNTER — Other Ambulatory Visit: Payer: Self-pay

## 2019-10-20 ENCOUNTER — Ambulatory Visit (INDEPENDENT_AMBULATORY_CARE_PROVIDER_SITE_OTHER): Payer: Medicare Other | Admitting: Family Medicine

## 2019-10-20 ENCOUNTER — Encounter: Payer: Self-pay | Admitting: Family Medicine

## 2019-10-20 VITALS — BP 148/82 | HR 93 | Temp 98.1°F

## 2019-10-20 DIAGNOSIS — E1122 Type 2 diabetes mellitus with diabetic chronic kidney disease: Secondary | ICD-10-CM

## 2019-10-20 DIAGNOSIS — I129 Hypertensive chronic kidney disease with stage 1 through stage 4 chronic kidney disease, or unspecified chronic kidney disease: Secondary | ICD-10-CM | POA: Diagnosis not present

## 2019-10-20 DIAGNOSIS — N183 Chronic kidney disease, stage 3 unspecified: Secondary | ICD-10-CM

## 2019-10-20 DIAGNOSIS — B379 Candidiasis, unspecified: Secondary | ICD-10-CM | POA: Diagnosis not present

## 2019-10-20 DIAGNOSIS — I4821 Permanent atrial fibrillation: Secondary | ICD-10-CM

## 2019-10-20 DIAGNOSIS — R350 Frequency of micturition: Secondary | ICD-10-CM

## 2019-10-20 DIAGNOSIS — I10 Essential (primary) hypertension: Secondary | ICD-10-CM

## 2019-10-20 DIAGNOSIS — E782 Mixed hyperlipidemia: Secondary | ICD-10-CM

## 2019-10-20 LAB — UA/M W/RFLX CULTURE, ROUTINE
Bilirubin, UA: NEGATIVE
Ketones, UA: NEGATIVE
Leukocytes,UA: NEGATIVE
Nitrite, UA: NEGATIVE
Protein,UA: NEGATIVE
Specific Gravity, UA: <1.005 — ABNORMAL LOW (ref 1.005–1.030)
Urobilinogen, Ur: 0.2 mg/dL (ref 0.2–1.0)
pH, UA: 5 (ref 5.0–7.5)

## 2019-10-20 LAB — MICROSCOPIC EXAMINATION
Bacteria, UA: NONE SEEN
WBC, UA: NONE SEEN /hpf (ref 0–5)

## 2019-10-20 LAB — BAYER DCA HB A1C WAIVED: HB A1C (BAYER DCA - WAIVED): >14 % — ABNORMAL HIGH (ref <7.0)

## 2019-10-20 MED ORDER — NYSTATIN 100000 UNIT/GM EX POWD: Freq: Four times a day (QID) | CUTANEOUS | 0 refills | Status: DC

## 2019-10-20 NOTE — Assessment & Plan Note (Signed)
HR under good control. Will check labs. Await results. Call with any concerns.

## 2019-10-20 NOTE — Assessment & Plan Note (Signed)
Checking labs today. Await results.  

## 2019-10-20 NOTE — Assessment & Plan Note (Signed)
3+ glucose in urine. Checking A1c- await results and treat as needed.

## 2019-10-20 NOTE — Assessment & Plan Note (Signed)
Slightly elevated. Work on [employer]. Recheck 2-3 weeks.

## 2019-10-20 NOTE — Patient Instructions (Signed)
Genital Yeast Infection, Male In men, a genital yeast infection is a condition that causes soreness, swelling, and redness (inflammation) of the head of the penis (glans penis). A genital yeast infection can be spread through sexual contact, but it can also develop without sexual contact. If the infection is not treated properly, it is likely to come back. What are the causes? This condition is caused by a change in the normal balance of the yeast and bacteria that live on the skin. This change causes an overgrowth of yeast, which causes the inflammation. Many types of yeast can cause this infection, but Candida is the most common. What increases the risk? The following factors may make you more likely to develop this condition:  Taking antibiotics.  Having diabetes.  Being exposed to the infection by a sexual partner.  Being uncircumcised.  Having a weak body defense system (immune system).  Taking steroid medicines for a long time.  Having poor hygiene. What are the signs or symptoms? Symptoms of this condition include:  Itching of the groin and penis.  Dry, red, or cracked skin on the penis.  Swelling of the genital area.  Pain while urinating or difficulty urinating.  Thick, bad-smelling discharge on the penis. How is this diagnosed? This condition may be diagnosed based on:  Your medical history.  A physical exam. You may also have tests, such as:  Test of a sample of discharge from the penis.  Urine tests.  Blood tests. How is this treated? This condition is treated with:  Anti-fungal creams or medicines. Anti-fungal medicines may be prescribed by your health care provider or they may be available over-the-counter.  Self-care at home. For men who are not circumcised, circumcision may be recommended to control infections that return and are difficult to treat. Follow these instructions at home: Medicines   Take or apply over-the-counter and prescription  medicines only as told by your health care provider.  Take your anti-fungal medicine as told by your health care provider. Do not stop taking the medicine even if you start to feel better. Self care  Wash your penis with soap and water every day. If you are not circumcised, pull back the foreskin to wash. Make sure to dry your penis completely after washing.  Wear breathable, cotton underwear.  Keep your underwear clean and dry. General instructions  Do not have sex until your health care provider has approved. Tell your sexual partner that you have a yeast infection. That person should go for treatment even if no symptoms are present.  If you have diabetes, keep your blood sugar levels within your target range.  Keep all follow-up visits as told by your health care provider. This is important. Contact a health care provider if you:  Have a fever.  Have symptoms that go away and then return.  Do not get better with treatment.  Have symptoms that get worse.  Have new symptoms. Get help right away if:  Your swelling and inflammation become so severe that you cannot urinate. Summary  In men, a genital yeast infection is a condition that causes soreness, swelling, and redness (inflammation) of the head of the penis (glans penis).  This condition is caused by a change in the normal balance of the yeast and bacteria that live on the skin. This change causes an overgrowth of yeast, which causes the inflammation.  A genital yeast infection usually spreads through sexual contact, but it can develop without sexual contact. For instance, you may   be more likely to develop this infection if you take antibiotics or steroids, have diabetes, are not circumcised, have a weak immune system, or have poor hygiene.  This condition is treated with anti-fungal cream or pills along with self-care at home. This information is not intended to replace advice given to you by your health care provider.  Make sure you discuss any questions you have with your health care provider. Document Released: 01/11/2005 Document Revised: 01/08/2018 Document Reviewed: 01/08/2018 Elsevier Patient Education  2020 Elsevier Inc.  

## 2019-10-20 NOTE — Progress Notes (Signed)
BP (!) 148/82   Pulse 93   Temp 98.1 F (36.7 C)   SpO2 96%    Subjective:    Patient ID: Ernest [name], male    DOB: 19-Dec-1950, 68 y.o.   MRN: [id number]  HPI: Ernest [name] is a 68 y.o. male  Chief Complaint  Patient presents with  . Urinary Frequency    incontinence, raw and peeling penis   URINARY SYMPTOMS Duration: Last week Dysuria: burning Urinary frequency: yes Urgency: yes Small volume voids: no Symptom severity: moderate Urinary incontinence: yes Foul odor: no Hematuria: no Abdominal pain: no Back pain: no Suprapubic pain/pressure: no Flank pain: no Fever:  no Vomiting: no Relief with cranberry juice: no Relief with pyridium: no Status: better/worse/stable Previous urinary tract infection: no Recurrent urinary tract infection: no Penile discharge: no Treatments attempted: none   Relevant past medical, surgical, family and social history reviewed and updated as indicated. Interim medical history since our last visit reviewed. Allergies and medications reviewed and updated.  Review of Systems  Constitutional: Negative.   Respiratory: Negative.   Cardiovascular: Negative.   Gastrointestinal: Negative.   Genitourinary: Positive for frequency and urgency. Negative for decreased urine volume, difficulty urinating, discharge, dysuria, enuresis, flank pain, genital sores, hematuria, penile pain, penile swelling, scrotal swelling and testicular pain.  Musculoskeletal: Negative.   Skin: Positive for color change and rash. Negative for pallor and wound.  Neurological: Negative.   Psychiatric/Behavioral: Negative.     Per HPI unless specifically indicated above     Objective:    BP (!) 148/82   Pulse 93   Temp 98.1 F (36.7 C)   SpO2 96%   Wt Readings from Last 3 Encounters:  03/08/19 242 lb 12.8 oz (110.1 kg)  02/04/19 268 lb 4.8 oz (121.7 kg)  02/03/19 271 lb (122.9 kg)    Physical Exam Vitals signs and nursing note reviewed.   Constitutional:      General: He is not in acute distress.    Appearance: Normal appearance. He is not ill-appearing, toxic-appearing or diaphoretic.  HENT:     Head: Normocephalic and atraumatic.     Right Ear: External ear normal.     Left Ear: External ear normal.     Nose: Nose normal.     Mouth/Throat:     Mouth: Mucous membranes are moist.     Pharynx: Oropharynx is clear.  Eyes:     General: No scleral icterus.       Right eye: No discharge.        Left eye: No discharge.     Conjunctiva/sclera: Conjunctivae normal.     Pupils: Pupils are equal, round, and reactive to light.  Neck:     Musculoskeletal: Normal range of motion.  Pulmonary:     Effort: Pulmonary effort is normal. No respiratory distress.     Comments: Speaking in full sentences Musculoskeletal: Normal range of motion.  Skin:    Coloration: Skin is not jaundiced or pale.     Findings: No bruising, erythema, lesion or rash.  Neurological:     Mental Status: He is alert and oriented to person, place, and time. Mental status is at baseline.  Psychiatric:        Mood and Affect: Mood normal.        Behavior: Behavior normal.        Thought Content: Thought content normal.        Judgment: Judgment normal.     Results for orders  placed or performed in visit on 123456  Basic metabolic panel  Result Value Ref Range   Glucose 146 (H) 65 - 99 mg/dL   BUN 20 8 - 27 mg/dL   Creatinine, Ser 1.02 0.76 - 1.27 mg/dL   GFR calc non Af Amer 75 >59 mL/min/1.73   GFR calc Af Amer 86 >59 mL/min/1.73   BUN/Creatinine Ratio 20 10 - 24   Sodium 145 (H) 134 - 144 mmol/L   Potassium 4.5 3.5 - 5.2 mmol/L   Chloride 107 (H) 96 - 106 mmol/L   CO2 23 20 - 29 mmol/L   Calcium 9.2 8.6 - 10.2 mg/dL  Bayer DCA Hb A1c Waived  Result Value Ref Range   HB A1C (BAYER DCA - WAIVED) 6.8 <7.0 %      Assessment & Plan:   Problem List Items Addressed This Visit      Cardiovascular and Mediastinum   Atrial fibrillation (HCC)     HR under good control. Will check labs. Await results. Call with any concerns.       Relevant Orders   CBC with Differential/Platelet   Comprehensive metabolic panel   TSH   Essential hypertension    Slightly elevated. Work on [employer]. Recheck 2-3 weeks.       Relevant Orders   Comprehensive metabolic panel   TSH   Type 2 DM with CKD stage 3 and hypertension ([place])    3+ glucose in urine. Checking A1c- await results and treat as needed.       Relevant Orders   Bayer DCA Hb A1c Waived   Comprehensive metabolic panel   TSH     Other   Hyperlipidemia    Checking labs today. Await results.       Relevant Orders   Comprehensive metabolic panel   Lipid Panel w/o Chol/HDL Ratio   TSH    Other Visit Diagnoses    Yeast infection    -  Primary   Will treat with nystatin. Call if not getting better or getting worse.    Relevant Medications   nystatin (MYCOSTATIN/NYSTOP) powder   Urinary frequency       3+ glucose- will check labs. Cut soda. Continue to monitor.    Relevant Orders   UA/M w/rflx Culture, Routine   PSA   TSH       Follow up plan: Return in about 3 weeks (around 11/10/2019) for OK to move up the appointment in December.   . This visit was completed via FaceTime due to the restrictions of the COVID-19 pandemic. All issues as above were discussed and addressed. Physical exam was done as above through visual confirmation on FaceTime. If it was felt that the patient should be evaluated in the office, they were directed there. The patient verbally consented to this visit. . Location of the patient: in office . Location of the provider: work . Those involved with this call:  . Provider: Park Liter, DO . CMA: Tiffany Reel, CMA . Front Desk/Registration: Don Perking  . Time spent on call: 25 minutes with patient face to face via video conference. More than 50% of this time was spent in counseling and coordination of care. 40 minutes total spent in  review of patient's record and preparation of their chart.

## 2019-10-21 ENCOUNTER — Telehealth: Payer: Self-pay | Admitting: Family Medicine

## 2019-10-21 LAB — CBC WITH DIFFERENTIAL/PLATELET
Basophils Absolute: 0.1 10*3/uL (ref 0.0–0.2)
Basos: 1 %
EOS (ABSOLUTE): 0.1 10*3/uL (ref 0.0–0.4)
Eos: 1 %
Hematocrit: 51.4 % — ABNORMAL HIGH (ref 37.5–51.0)
Hemoglobin: 17.4 g/dL (ref 13.0–17.7)
Immature Grans (Abs): 0 10*3/uL (ref 0.0–0.1)
Immature Granulocytes: 0 %
Lymphocytes Absolute: 3.8 10*3/uL — ABNORMAL HIGH (ref 0.7–3.1)
Lymphs: 41 %
MCH: 27.6 pg (ref 26.6–33.0)
MCHC: 33.9 g/dL (ref 31.5–35.7)
MCV: 82 fL (ref 79–97)
Monocytes Absolute: 0.5 10*3/uL (ref 0.1–0.9)
Monocytes: 5 %
Neutrophils Absolute: 4.7 10*3/uL (ref 1.4–7.0)
Neutrophils: 52 %
Platelets: 194 10*3/uL (ref 150–450)
RBC: 6.3 x10E6/uL — ABNORMAL HIGH (ref 4.14–5.80)
RDW: 13.5 % (ref 11.6–15.4)
WBC: 9.2 10*3/uL (ref 3.4–10.8)

## 2019-10-21 LAB — COMPREHENSIVE METABOLIC PANEL
ALT: 24 IU/L (ref 0–44)
AST: 20 IU/L (ref 0–40)
Albumin/Globulin Ratio: 2.1 (ref 1.2–2.2)
Albumin: 4.8 g/dL (ref 3.8–4.8)
Alkaline Phosphatase: 141 IU/L — ABNORMAL HIGH (ref 39–117)
BUN/Creatinine Ratio: 11 (ref 10–24)
BUN: 13 mg/dL (ref 8–27)
Bilirubin Total: 1.5 mg/dL — ABNORMAL HIGH (ref 0.0–1.2)
CO2: 24 mmol/L (ref 20–29)
Calcium: 10.1 mg/dL (ref 8.6–10.2)
Chloride: 89 mmol/L — ABNORMAL LOW (ref 96–106)
Creatinine, Ser: 1.21 mg/dL (ref 0.76–1.27)
GFR calc Af Amer: 70 mL/min/{1.73_m2} (ref >59)
GFR calc non Af Amer: 61 mL/min/{1.73_m2} (ref >59)
Globulin, Total: 2.3 g/dL (ref 1.5–4.5)
Glucose: 656 mg/dL (ref 65–99)
Potassium: 4.4 mmol/L (ref 3.5–5.2)
Sodium: 133 mmol/L — ABNORMAL LOW (ref 134–144)
Total Protein: 7.1 g/dL (ref 6.0–8.5)

## 2019-10-21 LAB — PSA: Prostate Specific Ag, Serum: 1.1 ng/mL (ref 0.0–4.0)

## 2019-10-21 LAB — LIPID PANEL W/O CHOL/HDL RATIO
Cholesterol, Total: 143 mg/dL (ref 100–199)
HDL: 26 mg/dL — ABNORMAL LOW (ref >39)
LDL Chol Calc (NIH): 44 mg/dL (ref 0–99)
Triglycerides: 502 mg/dL — ABNORMAL HIGH (ref 0–149)
VLDL Cholesterol Cal: 73 mg/dL — ABNORMAL HIGH (ref 5–40)

## 2019-10-21 LAB — TSH: TSH: 2.3 u[IU]/mL (ref 0.450–4.500)

## 2019-10-21 MED ORDER — METFORMIN HCL ER (MOD) 500 MG PO TB24: 1000.0000 mg | ORAL_TABLET | Freq: Two times a day (BID) | ORAL | 3 refills | Status: DC

## 2019-10-21 NOTE — Telephone Encounter (Signed)
Please let him know that his sugar came back very high (A1c >14, and glucose 656). I think he needs to go on more medicine. Is there a reason why he is not on metformin? Because I'd like to put him back on it and see how he does.

## 2019-10-21 NOTE — Telephone Encounter (Signed)
Called and spoke with patient. Message relayed. Patient stated that he was just told that he needs to get off of metformin. Patient states he is fine with taking back the metformin. [facility] pharmacy in [place].

## 2019-10-24 ENCOUNTER — Telehealth: Payer: Self-pay

## 2019-10-24 NOTE — Telephone Encounter (Signed)
PA for Metformin initiated and submitted via Cover My Meds. Key: [id number]

## 2019-10-27 ENCOUNTER — Other Ambulatory Visit: Payer: Self-pay

## 2019-10-27 DIAGNOSIS — I10 Essential (primary) hypertension: Secondary | ICD-10-CM

## 2019-10-27 MED ORDER — METFORMIN HCL ER 500 MG PO TB24: 1000.0000 mg | ORAL_TABLET | Freq: Two times a day (BID) | ORAL | 2 refills | Status: DC

## 2019-10-27 MED ORDER — RAMIPRIL 10 MG PO CAPS: ORAL_CAPSULE | ORAL | 0 refills | Status: DC

## 2019-10-27 MED ORDER — METOPROLOL SUCCINATE ER 25 MG PO TB24: ORAL_TABLET | ORAL | 0 refills | Status: DC

## 2019-10-27 NOTE — Telephone Encounter (Signed)
PA for the current Metformin denied. "Metformin ER (generic Glumetza) is denied because it is not on your plan's Drug List (formulary). You need to first try metformin ER (generic Glucophage XR)"  Can we send in the preferred medication for the patient?

## 2019-11-11 ENCOUNTER — Ambulatory Visit (INDEPENDENT_AMBULATORY_CARE_PROVIDER_SITE_OTHER): Payer: Medicare Other | Admitting: Family Medicine

## 2019-11-11 ENCOUNTER — Encounter: Payer: Self-pay | Admitting: Family Medicine

## 2019-11-11 ENCOUNTER — Other Ambulatory Visit: Payer: Self-pay

## 2019-11-11 DIAGNOSIS — E1122 Type 2 diabetes mellitus with diabetic chronic kidney disease: Secondary | ICD-10-CM

## 2019-11-11 DIAGNOSIS — I129 Hypertensive chronic kidney disease with stage 1 through stage 4 chronic kidney disease, or unspecified chronic kidney disease: Secondary | ICD-10-CM | POA: Diagnosis not present

## 2019-11-11 DIAGNOSIS — N183 Chronic kidney disease, stage 3 unspecified: Secondary | ICD-10-CM

## 2019-11-11 NOTE — Assessment & Plan Note (Signed)
Feeling better. Yeast infection has resolved. Urine better. Will get him in for recheck on labs next week. Recheck 2 months. Call with any concerns.

## 2019-11-11 NOTE — Progress Notes (Signed)
There were no vitals taken for this visit.   Subjective:    Patient ID: Ernest [name], male    DOB: 07-Aug-1951, 68 y.o.   MRN: [id number]  HPI: Ernest [name] is a 68 y.o. male  Chief Complaint  Patient presents with  . Diabetes   DIABETES Hypoglycemic episodes:no Polydipsia/polyuria: no Visual disturbance: yes Chest pain: no Paresthesias: no Glucose Monitoring: no  Accucheck frequency: Not Checking Taking Insulin?: no Blood Pressure Monitoring: not checking Retinal Examination: Not up to Date Foot Exam: Not up to Date Diabetic Education: Completed Pneumovax: Up to Date Influenza: Not up to Date Aspirin: yes  Relevant past medical, surgical, family and social history reviewed and updated as indicated. Interim medical history since our last visit reviewed. Allergies and medications reviewed and updated.  Review of Systems  Constitutional: Negative.   Respiratory: Negative.   Cardiovascular: Negative.   Musculoskeletal: Negative.   Neurological: Negative.   Psychiatric/Behavioral: Negative.     Per HPI unless specifically indicated above     Objective:    There were no vitals taken for this visit.  Wt Readings from Last 3 Encounters:  03/08/19 242 lb 12.8 oz (110.1 kg)  02/04/19 268 lb 4.8 oz (121.7 kg)  02/03/19 271 lb (122.9 kg)    Physical Exam Vitals signs and nursing note reviewed.  Pulmonary:     Effort: Pulmonary effort is normal. No respiratory distress.     Comments: Speaking in full sentences Neurological:     Mental Status: He is alert.  Psychiatric:        Mood and Affect: Mood normal.        Behavior: Behavior normal.        Thought Content: Thought content normal.        Judgment: Judgment normal.     Results for orders placed or performed in visit on 10/20/19  Microscopic Examination   URINE  Result Value Ref Range   WBC, UA None seen 0 - 5 /hpf   RBC 0-2 0 - 2 /hpf   Epithelial Cells (non renal) 0-10 0 - 10 /hpf   Bacteria, UA None seen None seen/Few  UA/M w/rflx Culture, Routine   Specimen: Urine   URINE  Result Value Ref Range   Specific Gravity, UA <1.005 (L) 1.005 - 1.030   pH, UA 5.0 5.0 - 7.5   Color, UA Yellow Yellow   Appearance Ur Clear Clear   Leukocytes,UA Negative Negative   Protein,UA Negative Negative/Trace   Glucose, UA 3+ (A) Negative   Ketones, UA Negative Negative   RBC, UA Trace (A) Negative   Bilirubin, UA Negative Negative   Urobilinogen, Ur 0.2 0.2 - 1.0 mg/dL   Nitrite, UA Negative Negative   Microscopic Examination See below:   Bayer DCA Hb A1c Waived  Result Value Ref Range   HB A1C (BAYER DCA - WAIVED) >14.0 (H) <7.0 %  CBC with Differential/Platelet  Result Value Ref Range   WBC 9.2 3.4 - 10.8 x10E3/uL   RBC 6.30 (H) 4.14 - 5.80 x10E6/uL   Hemoglobin 17.4 13.0 - 17.7 g/dL   Hematocrit 51.4 (H) 37.5 - 51.0 %   MCV 82 79 - 97 fL   MCH 27.6 26.6 - 33.0 pg   MCHC 33.9 31.5 - 35.7 g/dL   RDW 13.5 11.6 - 15.4 %   Platelets 194 150 - 450 x10E3/uL   Neutrophils 52 Not Estab. %   Lymphs 41 Not Estab. %   Monocytes 5  Not Estab. %   Eos 1 Not Estab. %   Basos 1 Not Estab. %   Neutrophils Absolute 4.7 1.4 - 7.0 x10E3/uL   Lymphocytes Absolute 3.8 (H) 0.7 - 3.1 x10E3/uL   Monocytes Absolute 0.5 0.1 - 0.9 x10E3/uL   EOS (ABSOLUTE) 0.1 0.0 - 0.4 x10E3/uL   Basophils Absolute 0.1 0.0 - 0.2 x10E3/uL   Immature Granulocytes 0 Not Estab. %   Immature Grans (Abs) 0.0 0.0 - 0.1 x10E3/uL  Comprehensive metabolic panel  Result Value Ref Range   Glucose 656 (HH) 65 - 99 mg/dL   BUN 13 8 - 27 mg/dL   Creatinine, Ser 1.21 0.76 - 1.27 mg/dL   GFR calc non Af Amer 61 >59 mL/min/1.73   GFR calc Af Amer 70 >59 mL/min/1.73   BUN/Creatinine Ratio 11 10 - 24   Sodium 133 (L) 134 - 144 mmol/L   Potassium 4.4 3.5 - 5.2 mmol/L   Chloride 89 (L) 96 - 106 mmol/L   CO2 24 20 - 29 mmol/L   Calcium 10.1 8.6 - 10.2 mg/dL   Total Protein 7.1 6.0 - 8.5 g/dL   Albumin 4.8 3.8 - 4.8 g/dL    Globulin, Total 2.3 1.5 - 4.5 g/dL   Albumin/Globulin Ratio 2.1 1.2 - 2.2   Bilirubin Total 1.5 (H) 0.0 - 1.2 mg/dL   Alkaline Phosphatase 141 (H) 39 - 117 IU/L   AST 20 0 - 40 IU/L   ALT 24 0 - 44 IU/L  Lipid Panel w/o Chol/HDL Ratio  Result Value Ref Range   Cholesterol, Total 143 100 - 199 mg/dL   Triglycerides 502 (H) 0 - 149 mg/dL   HDL 26 (L) >39 mg/dL   VLDL Cholesterol Cal 73 (H) 5 - 40 mg/dL   LDL Chol Calc (NIH) 44 0 - 99 mg/dL  PSA  Result Value Ref Range   Prostate Specific Ag, Serum 1.1 0.0 - 4.0 ng/mL  TSH  Result Value Ref Range   TSH 2.300 0.450 - 4.500 uIU/mL      Assessment & Plan:   Problem List Items Addressed This Visit      Cardiovascular and Mediastinum   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 Yeast infection has resolved. Urine better. Will get him in for recheck on labs next week. Recheck 2 months. Call with any concerns.       Relevant Orders   Comp Met (CMET)       Follow up plan: Return in about 2 months (around 01/11/2020).   . This visit was completed via telephone due to the restrictions of the COVID-19 pandemic. All issues as above were discussed and addressed but no physical exam was performed. If it was felt that the patient should be evaluated in the office, they were directed there. The patient verbally consented to this visit. Patient was unable to complete an audio/visual visit due to Lack of equipment. Due to the catastrophic nature of the COVID-19 pandemic, this visit was done through audio contact only. . Location of the patient: home . Location of the provider: work . Those involved with this call:  . Provider: Park Liter, DO . CMA: Tiffany Reel, CMA . Front Desk/Registration: Don Perking  . Time spent on call: 21 minutes on the phone discussing health concerns. 23 minutes total spent in review of patient's record and preparation of their chart.

## 2019-11-18 ENCOUNTER — Other Ambulatory Visit: Payer: Medicare Other

## 2019-11-21 ENCOUNTER — Ambulatory Visit (INDEPENDENT_AMBULATORY_CARE_PROVIDER_SITE_OTHER): Payer: Medicare Other

## 2019-11-21 ENCOUNTER — Other Ambulatory Visit: Payer: Self-pay

## 2019-11-21 ENCOUNTER — Other Ambulatory Visit: Payer: Medicare Other

## 2019-11-21 DIAGNOSIS — Z23 Encounter for immunization: Secondary | ICD-10-CM | POA: Diagnosis not present

## 2019-11-21 DIAGNOSIS — I129 Hypertensive chronic kidney disease with stage 1 through stage 4 chronic kidney disease, or unspecified chronic kidney disease: Secondary | ICD-10-CM | POA: Diagnosis not present

## 2019-11-21 DIAGNOSIS — N183 Chronic kidney disease, stage 3 unspecified: Secondary | ICD-10-CM

## 2019-11-21 DIAGNOSIS — E1122 Type 2 diabetes mellitus with diabetic chronic kidney disease: Secondary | ICD-10-CM | POA: Diagnosis not present

## 2019-11-22 LAB — COMPREHENSIVE METABOLIC PANEL
ALT: 20 IU/L (ref 0–44)
AST: 22 IU/L (ref 0–40)
Albumin/Globulin Ratio: 2.5 — ABNORMAL HIGH (ref 1.2–2.2)
Albumin: 4.8 g/dL (ref 3.8–4.8)
Alkaline Phosphatase: 84 IU/L (ref 39–117)
BUN/Creatinine Ratio: 16 (ref 10–24)
BUN: 16 mg/dL (ref 8–27)
Bilirubin Total: 1.2 mg/dL (ref 0.0–1.2)
CO2: 22 mmol/L (ref 20–29)
Calcium: 9.8 mg/dL (ref 8.6–10.2)
Chloride: 105 mmol/L (ref 96–106)
Creatinine, Ser: 1.01 mg/dL (ref 0.76–1.27)
GFR calc Af Amer: 87 mL/min/{1.73_m2} (ref >59)
GFR calc non Af Amer: 76 mL/min/{1.73_m2} (ref >59)
Globulin, Total: 1.9 g/dL (ref 1.5–4.5)
Glucose: 158 mg/dL — ABNORMAL HIGH (ref 65–99)
Potassium: 4 mmol/L (ref 3.5–5.2)
Sodium: 144 mmol/L (ref 134–144)
Total Protein: 6.7 g/dL (ref 6.0–8.5)

## 2019-11-24 ENCOUNTER — Encounter: Payer: Self-pay | Admitting: Family Medicine

## 2019-11-25 ENCOUNTER — Ambulatory Visit: Payer: Medicare Other | Admitting: Pharmacist

## 2019-11-25 DIAGNOSIS — I4821 Permanent atrial fibrillation: Secondary | ICD-10-CM

## 2019-11-25 DIAGNOSIS — I129 Hypertensive chronic kidney disease with stage 1 through stage 4 chronic kidney disease, or unspecified chronic kidney disease: Secondary | ICD-10-CM

## 2019-11-25 NOTE — Chronic Care Management (AMB) (Signed)
  Chronic Care Management   Note  11/25/2019 Name: Ernest [name] MRN: [id number] DOB: [birth year]  Ernest [name] is a 68 y.o. year old male who is a primary care patient of Crissman, Jeannette How, MD. The CCM team was consulted by the patient's healthcare plan for assistance with chronic disease management and care coordination needs.    Received referral from [facility] for patient back in August; patient was outreached and consented, and this phone appointment was scheduled. Called patient; however, he had forgotten about this appointment and was driving. Discussed CCM service; patient agrees to his previous consent. Schedule a phone call on 12/17/2019 at 2:30 pm for full medication review.   Follow up plan: - Will outreach patient as scheduled  Catie Darnelle Maffucci, PharmD, [place] [phone]

## 2019-11-28 ENCOUNTER — Other Ambulatory Visit: Payer: Self-pay

## 2019-11-28 DIAGNOSIS — I482 Chronic atrial fibrillation, unspecified: Secondary | ICD-10-CM

## 2019-11-28 MED ORDER — RIVAROXABAN 20 MG PO TABS: ORAL_TABLET | ORAL | 1 refills | Status: DC

## 2019-11-28 NOTE — Telephone Encounter (Signed)
Patient last seen 11/11/19 and has appointment 12/01/19.

## 2019-12-01 ENCOUNTER — Ambulatory Visit: Payer: Medicare Other | Admitting: Family Medicine

## 2019-12-01 ENCOUNTER — Other Ambulatory Visit: Payer: Self-pay

## 2019-12-17 ENCOUNTER — Ambulatory Visit: Payer: Self-pay | Admitting: Pharmacist

## 2019-12-17 ENCOUNTER — Telehealth: Payer: Medicare Other

## 2019-12-17 NOTE — Chronic Care Management (AMB) (Signed)
  Chronic Care Management   Note  12/17/2019 Name: Ernest [name] MRN: [id number] DOB: 06/24/1951  [name] is a 68 y.o. year old male who is a primary care patient of Crissman, Jeannette How, MD. The CCM team was consulted for assistance with chronic disease management and care coordination needs.    Contacted patient as previously scheduled for medication review, however, patient had forgotten about our appointment and was at [facility]. We agreed to reschedule a phone appointment for after his upcoming visit with Dr. Wynetta Emery.   Reschedule  Follow up plan: - Rescheduled initial phone call for 01/28/20 at 2 pm.  Catie Darnelle Maffucci, PharmD, [place] [phone]

## 2019-12-29 ENCOUNTER — Other Ambulatory Visit: Payer: Self-pay | Admitting: Family Medicine

## 2020-01-12 ENCOUNTER — Ambulatory Visit (INDEPENDENT_AMBULATORY_CARE_PROVIDER_SITE_OTHER): Payer: Medicare HMO | Admitting: Family Medicine

## 2020-01-12 ENCOUNTER — Encounter: Payer: Self-pay | Admitting: Family Medicine

## 2020-01-12 VITALS — Ht 70.0 in

## 2020-01-12 DIAGNOSIS — Z20822 Contact with and (suspected) exposure to covid-19: Secondary | ICD-10-CM | POA: Diagnosis not present

## 2020-01-12 DIAGNOSIS — I10 Essential (primary) hypertension: Secondary | ICD-10-CM

## 2020-01-12 DIAGNOSIS — E1122 Type 2 diabetes mellitus with diabetic chronic kidney disease: Secondary | ICD-10-CM | POA: Diagnosis not present

## 2020-01-12 DIAGNOSIS — I482 Chronic atrial fibrillation, unspecified: Secondary | ICD-10-CM

## 2020-01-12 DIAGNOSIS — G2581 Restless legs syndrome: Secondary | ICD-10-CM | POA: Diagnosis not present

## 2020-01-12 DIAGNOSIS — I4891 Unspecified atrial fibrillation: Secondary | ICD-10-CM | POA: Diagnosis not present

## 2020-01-12 DIAGNOSIS — I129 Hypertensive chronic kidney disease with stage 1 through stage 4 chronic kidney disease, or unspecified chronic kidney disease: Secondary | ICD-10-CM | POA: Diagnosis not present

## 2020-01-12 DIAGNOSIS — N183 Chronic kidney disease, stage 3 unspecified: Secondary | ICD-10-CM

## 2020-01-12 MED ORDER — METFORMIN HCL ER 500 MG PO TB24: 1000.0000 mg | ORAL_TABLET | Freq: Two times a day (BID) | ORAL | 1 refills | Status: DC

## 2020-01-12 MED ORDER — METOPROLOL SUCCINATE ER 25 MG PO TB24: ORAL_TABLET | ORAL | 1 refills | Status: DC

## 2020-01-12 MED ORDER — CANAGLIFLOZIN 300 MG PO TABS: 300.0000 mg | ORAL_TABLET | Freq: Every day | ORAL | 1 refills | Status: DC

## 2020-01-12 MED ORDER — FUROSEMIDE 40 MG PO TABS: 20.0000 mg | ORAL_TABLET | Freq: Two times a day (BID) | ORAL | 1 refills | Status: DC

## 2020-01-12 MED ORDER — RAMIPRIL 10 MG PO CAPS: ORAL_CAPSULE | ORAL | 1 refills | Status: DC

## 2020-01-12 MED ORDER — PANTOPRAZOLE SODIUM 40 MG PO TBEC: 40.0000 mg | DELAYED_RELEASE_TABLET | Freq: Every day | ORAL | 1 refills | Status: DC

## 2020-01-12 MED ORDER — GABAPENTIN 600 MG PO TABS: 1200.0000 mg | ORAL_TABLET | Freq: Every day | ORAL | 1 refills | Status: DC

## 2020-01-12 MED ORDER — GLIPIZIDE 5 MG PO TABS: ORAL_TABLET | ORAL | 1 refills | Status: DC

## 2020-01-12 NOTE — Assessment & Plan Note (Signed)
Has been losing weight without effort and not checking his sugars. We need to check A1c, but with recent suspected covid will need to wait 2 weeks before coming into lab. Will adjust medications at that time. Continue to monitor.

## 2020-01-12 NOTE — Progress Notes (Signed)
Ht 5' 10"  (1.778 m)   BMI 34.84 kg/m    Subjective:    Patient ID: Ernest [name], male    DOB: 1951/06/15, 69 y.o.   MRN: [id number]  HPI: Ernest [name] is a 69 y.o. male  Chief Complaint  Patient presents with  . Diabetes    pt states he has been losing weight lately   DIABETES- has been losing weight without trying to Hypoglycemic episodes:no Polydipsia/polyuria: no Visual disturbance: no Chest pain: no Paresthesias: no Glucose Monitoring: no  Accucheck frequency: Not Checking Taking Insulin?: no Blood Pressure Monitoring: not checking Retinal Examination: Not up to Date Foot Exam: Not up to Date Diabetic Education: Completed Pneumovax: Up to Date Influenza: Up to Date Aspirin: yes  UPPER RESPIRATORY TRACT INFECTION Duration: 2 weeks, started feeling better today Worst symptom: Fever: yes Cough: yes Shortness of breath: no Wheezing: no Chest pain: no Chest tightness: no Chest congestion: no Nasal congestion: yes Runny nose: yes Post nasal drip: no Sneezing: yes Sore throat: no Swollen glands: no Sinus pressure: no Headache: no Face pain: no Toothache: no Ear pain: no  Ear pressure: no  Eyes red/itching:no Eye drainage/crusting: no  Vomiting: no Rash: no Fatigue: yes Sick contacts: yes- brother and sister have COVID Strep contacts: no  Context: better Recurrent sinusitis: no Relief with OTC cold/cough medications: no  Treatments attempted: cold/sinus    Relevant past medical, surgical, family and social history reviewed and updated as indicated. Interim medical history since our last visit reviewed. Allergies and medications reviewed and updated.  Review of Systems  Constitutional: Positive for fatigue and fever. Negative for activity change, appetite change, chills, diaphoresis and unexpected weight change.  HENT: Positive for congestion, postnasal drip, rhinorrhea and sneezing. Negative for dental problem, drooling, ear  discharge, ear pain, facial swelling, hearing loss, mouth sores, nosebleeds, sinus pressure, sinus pain, sore throat, tinnitus, trouble swallowing and voice change.   Eyes: Negative.   Respiratory: Negative.   Cardiovascular: Negative.   Gastrointestinal: Negative.   Psychiatric/Behavioral: Negative.     Per HPI unless specifically indicated above     Objective:    Ht 5' 10"  (1.778 m)   BMI 34.84 kg/m   Wt Readings from Last 3 Encounters:  03/08/19 242 lb 12.8 oz (110.1 kg)  02/04/19 268 lb 4.8 oz (121.7 kg)  02/03/19 271 lb (122.9 kg)    Physical Exam Vitals and nursing note reviewed.  Constitutional:      General: He is not in acute distress.    Appearance: Normal appearance. He is not ill-appearing, toxic-appearing or diaphoretic.  HENT:     Head: Normocephalic and atraumatic.     Right Ear: External ear normal.     Left Ear: External ear normal.     Nose: Nose normal.     Mouth/Throat:     Mouth: Mucous membranes are moist.     Pharynx: Oropharynx is clear.  Eyes:     General: No scleral icterus.       Right eye: No discharge.        Left eye: No discharge.     Conjunctiva/sclera: Conjunctivae normal.     Pupils: Pupils are equal, round, and reactive to light.  Pulmonary:     Effort: Pulmonary effort is normal. No respiratory distress.     Comments: Speaking in full sentences Musculoskeletal:        General: Normal range of motion.     Cervical back: Normal range of motion.  Skin:  Coloration: Skin is not jaundiced or pale.     Findings: No bruising, erythema, lesion or rash.  Neurological:     Mental Status: He is alert and oriented to person, place, and time. Mental status is at baseline.  Psychiatric:        Mood and Affect: Mood normal.        Behavior: Behavior normal.        Thought Content: Thought content normal.        Judgment: Judgment normal.     Results for orders placed or performed in visit on 11/21/19  Comp Met (CMET)  Result Value  Ref Range   Glucose 158 (H) 65 - 99 mg/dL   BUN 16 8 - 27 mg/dL   Creatinine, Ser 1.01 0.76 - 1.27 mg/dL   GFR calc non Af Amer 76 >59 mL/min/1.73   GFR calc Af Amer 87 >59 mL/min/1.73   BUN/Creatinine Ratio 16 10 - 24   Sodium 144 134 - 144 mmol/L   Potassium 4.0 3.5 - 5.2 mmol/L   Chloride 105 96 - 106 mmol/L   CO2 22 20 - 29 mmol/L   Calcium 9.8 8.6 - 10.2 mg/dL   Total Protein 6.7 6.0 - 8.5 g/dL   Albumin 4.8 3.8 - 4.8 g/dL   Globulin, Total 1.9 1.5 - 4.5 g/dL   Albumin/Globulin Ratio 2.5 (H) 1.2 - 2.2   Bilirubin Total 1.2 0.0 - 1.2 mg/dL   Alkaline Phosphatase 84 39 - 117 IU/L   AST 22 0 - 40 IU/L   ALT 20 0 - 44 IU/L      Assessment & Plan:   Problem List Items Addressed This Visit      Cardiovascular and Mediastinum   Atrial fibrillation (HCC)   Relevant Medications   ramipril (ALTACE) 10 MG capsule   metoprolol succinate (TOPROL-XL) 25 MG 24 hr tablet   furosemide (LASIX) 40 MG tablet   Essential hypertension    Due for refill- given today. Will discuss at next visit.       Relevant Medications   ramipril (ALTACE) 10 MG capsule   metoprolol succinate (TOPROL-XL) 25 MG 24 hr tablet   furosemide (LASIX) 40 MG tablet   Type 2 DM with CKD stage 3 and hypertension ([place])    Has been losing weight without effort and not checking his sugars. We need to check A1c, but with recent suspected covid will need to wait 2 weeks before coming into lab. Will adjust medications at that time. Continue to monitor.       Relevant Medications   ramipril (ALTACE) 10 MG capsule   metFORMIN (GLUCOPHAGE-XR) 500 MG 24 hr tablet   metoprolol succinate (TOPROL-XL) 25 MG 24 hr tablet   glipiZIDE (GLUCOTROL) 5 MG tablet   furosemide (LASIX) 40 MG tablet   canagliflozin (INVOKANA) 300 MG TABS tablet     Other   Restless legs    Due for refill- given today. Will discuss at next visit.       Relevant Medications   gabapentin (NEURONTIN) 600 MG tablet    Other Visit Diagnoses     Suspected COVID-19 virus infection    -  Primary   Does not want to get tested- quarantine for 2 weeks. Rest. Fluids. Symptomatic care. Call wiht any concerns.        Follow up plan: Return in about 3 months (around 04/11/2020) for dm.    . This visit was completed via FaceTime due to the restrictions  of the COVID-19 pandemic. All issues as above were discussed and addressed. Physical exam was done as above through visual confirmation on FaceTime. If it was felt that the patient should be evaluated in the office, they were directed there. The patient verbally consented to this visit. . Location of the patient: parking lot . Location of the provider: work . Those involved with this call:  . Provider: Park Liter, DO . CMA: Tiffany Reel, CMA . Front Desk/Registration: Don Perking  . Time spent on call: 25 minutes with patient face to face via video conference. More than 50% of this time was spent in counseling and coordination of care. 40 minutes total spent in review of patient's record and preparation of their chart.

## 2020-01-12 NOTE — Assessment & Plan Note (Signed)
Due for refill- given today. Will discuss at next visit.

## 2020-01-16 ENCOUNTER — Telehealth: Payer: Self-pay | Admitting: Family Medicine

## 2020-01-16 NOTE — Telephone Encounter (Signed)
-----   Message from [facility], DO sent at 01/12/2020  9:10 AM EST ----- 2 weeks lab, 3 months follow up

## 2020-01-16 NOTE — Telephone Encounter (Signed)
LVM for pt to call back.

## 2020-01-19 DIAGNOSIS — Z0100 Encounter for examination of eyes and vision without abnormal findings: Secondary | ICD-10-CM | POA: Diagnosis not present

## 2020-01-28 ENCOUNTER — Telehealth: Payer: Medicare Other

## 2020-01-28 ENCOUNTER — Ambulatory Visit: Payer: Self-pay | Admitting: Pharmacist

## 2020-01-28 NOTE — Chronic Care Management (AMB) (Signed)
  Chronic Care Management   Note  01/28/2020 Name: Ernest [name] MRN: [id number] DOB: Dec 18, 1951  Ernest [name] is a 69 y.o. year old male who is a primary care patient of Crissman, Jeannette How, MD. The CCM team was consulted for assistance with chronic disease management and care coordination needs.    Contacted patient for full medication review as previously scheduled, however, he had forgotten about our appointment and was at work and could not talk. Rescheduled for next available 60 minute slot in March.   Reiterated that he needs to call the office to schedule a lab appointment per last visit w/ Dr. Wynetta Emery. He verbalized understanding.   Catie Darnelle Maffucci, PharmD, [place] [phone]

## 2020-02-09 ENCOUNTER — Ambulatory Visit (INDEPENDENT_AMBULATORY_CARE_PROVIDER_SITE_OTHER): Payer: Medicare HMO

## 2020-02-09 ENCOUNTER — Telehealth: Payer: Self-pay | Admitting: Family Medicine

## 2020-02-09 VITALS — Ht 70.0 in

## 2020-02-09 DIAGNOSIS — I129 Hypertensive chronic kidney disease with stage 1 through stage 4 chronic kidney disease, or unspecified chronic kidney disease: Secondary | ICD-10-CM

## 2020-02-09 DIAGNOSIS — N183 Chronic kidney disease, stage 3 unspecified: Secondary | ICD-10-CM | POA: Diagnosis not present

## 2020-02-09 DIAGNOSIS — Z0000 Encounter for general adult medical examination without abnormal findings: Secondary | ICD-10-CM

## 2020-02-09 DIAGNOSIS — E1122 Type 2 diabetes mellitus with diabetic chronic kidney disease: Secondary | ICD-10-CM

## 2020-02-09 NOTE — Progress Notes (Signed)
Subjective:   Ernest [name] is a 69 y.o. male who presents for Medicare Annual/Subsequent preventive examination.  This visit is being conducted via phone call  - after an attmept to do on video chat - due to the COVID-19 pandemic. This patient has given me verbal consent via phone to conduct this visit, patient states they are participating from their home address. Some vital signs may be absent or patient reported.   Patient identification: identified by name, DOB, and current address.    Review of Systems:   Cardiac Risk Factors include: advanced age (>19men, >21 women);male gender     Objective:    Vitals: Ht 5\' 10"  (1.778 m)   BMI 34.84 kg/m   Body mass index is 34.84 kg/m.  Advanced Directives 02/09/2020 03/06/2019 03/06/2019 02/03/2019 01/07/2019 08/05/2018 07/02/2018  Does Patient Have a Medical Advance Directive? No No No No No No No  Would patient like information on creating a medical advance directive? - No - Patient declined No - Patient declined Yes (MAU/Ambulatory/Procedural Areas - Information given) Yes (MAU/Ambulatory/Procedural Areas - Information given) No - Patient declined No - Patient declined    Tobacco Social History   Tobacco Use  Smoking Status Former Smoker  . Packs/day: 4.00  . Years: 28.00  . Pack years: 112.00  . Types: Cigarettes  . Quit date: 08/25/1993  . Years since quitting: 26.4  Smokeless Tobacco Never Used     Counseling given: Not Answered   Clinical Intake:  Pre-visit preparation completed: Yes  Pain : No/denies pain     Nutritional Risks: None Diabetes: Yes CBG done?: No Did pt. bring in CBG monitor from home?: No  How often do you need to have someone help you when you read instructions, pamphlets, or other written materials from your doctor or pharmacy?: 1 - Never What is the last grade level you completed in school?: 8th grade  Nutrition Risk Assessment:  Has the patient had any N/V/D within the last 2 months?   No  Does the patient have any non-healing wounds?  No  Has the patient had any unintentional weight loss or weight gain?  Yes   Diabetes:  Is the patient diabetic?  Yes  If diabetic, was a CBG obtained today?  No  Did the patient bring in their glucometer from home?  No  How often do you monitor your CBG's? Weekly .   Financial Strains and Diabetes Management:  Are you having any financial strains with the device, your supplies or your medication? Yes .  Does the patient want to be seen by Chronic Care Management for management of their diabetes?  No  Would the patient like to be referred to a Nutritionist or for Diabetic Management?  No    Already in contact with CCM.   Diabetic Exams:  Diabetic Eye Exam: Overdue for diabetic eye exam. Pt has been advised about the importance in completing this exam. Goes to [facility] - will schedule an appt.   Diabetic Foot Exam: Pt has been advised about the importance in completing this exam.   Interpreter Needed?: No  Information entered by :: Yue Glasheen,LPN  Past Medical History:  Diagnosis Date  . Asthma   . COPD (chronic obstructive pulmonary disease) ([place])   . Diabetes mellitus    Type II  . Early Pulmonary fibrosis ([place])   . GERD (gastroesophageal reflux disease)   . History of echocardiogram    a. 02/2017 Echo: EF 60-65%, no rwma, mild  MR, mod dil LA. Nl RV fxn. PASP 16mmHg.  Marland Kitchen Hyperlipidemia   . Hypertension   . Morbid obesity ([place])   . Non-obstructive CAD (coronary artery disease)    a. 2011 Cath: nonobs dzs; b. 07/2014 Cath: LM 30, LAD nl, LCX nl, RCA 20p, 40m.  Marland Kitchen Permanent atrial fibrillation ([facility])    a. CHA2DS2VASc = 4-->xarelto.   Past Surgical History:  Procedure Laterality Date  . ANKLE SURGERY    . CARDIAC CATHETERIZATION  05/2010   [facility]  . CARDIAC CATHETERIZATION     [facility]  . CARDIAC CATHETERIZATION     [facility]  . CARDIAC CATHETERIZATION  07/2014   [facility]  . COLONOSCOPY    . COLONOSCOPY WITH PROPOFOL N/A  06/26/2018   Procedure: COLONOSCOPY WITH PROPOFOL;  Surgeon: Virgel Manifold, MD;  Location: [facility] ENDOSCOPY;  Service: Endoscopy;  Laterality: N/A;  . CORONARY STENT INTERVENTION N/A 03/07/2019   Procedure: CORONARY STENT INTERVENTION;  Surgeon: Nelva Bush, MD;  Location: [place] [facility];  Service: Cardiovascular;  Laterality: N/A;  . ENTEROSCOPY N/A 08/05/2018   Procedure: ENTEROSCOPY;  Surgeon: Virgel Manifold, MD;  Location: [facility] ENDOSCOPY;  Service: Endoscopy;  Laterality: N/A;  . ESOPHAGOGASTRODUODENOSCOPY (EGD) WITH PROPOFOL N/A 06/26/2018   Procedure: ESOPHAGOGASTRODUODENOSCOPY (EGD) WITH PROPOFOL;  Surgeon: Virgel Manifold, MD;  Location: [facility] ENDOSCOPY;  Service: Endoscopy;  Laterality: N/A;  . LEFT HEART CATH AND CORONARY ANGIOGRAPHY N/A 03/07/2019   Procedure: LEFT HEART CATH AND CORONARY ANGIOGRAPHY;  Surgeon: Nelva Bush, MD;  Location: [place] [facility];  Service: Cardiovascular;  Laterality: N/A;  . TESTICLE SURGERY  70's  . TUMOR EXCISION     Neck and finger; benign  . WRIST SURGERY     Family History  Problem Relation Age of Onset  . Hypertension Mother   . Hyperlipidemia Mother   . Diabetes Mother   . Heart disease Mother        CABG  . Pneumonia Father        rare type  . Diabetes Other   . Depression Other   . Coronary artery disease Other   . Alcohol abuse Other   . Hypertension Other   . Hyperlipidemia Other   . Cancer Brother   . Kidney Stones Brother    Social History   Socioeconomic History  . Marital status: Widowed    Spouse name: Not on file  . Number of children: Not on file  . Years of education: Not on file  . Highest education level: 8th grade  Occupational History  . Occupation: part time   Tobacco Use  . Smoking status: Former Smoker    Packs/day: 4.00    Years: 28.00    Pack years: 112.00    Types: Cigarettes    Quit date: 08/25/1993    Years since quitting: 26.4  . Smokeless tobacco: Never Used    Substance and Sexual Activity  . Alcohol use: Not Currently    Comment: rarely, occasional beer   . Drug use: No  . Sexual activity: Not on file  Other Topics Concern  . Not on file  Social History Narrative   No regular exercise.   Social Determinants of Health   Financial Resource Strain:   . Difficulty of Paying Living Expenses: Not on file  Food Insecurity:   . Worried About Charity fundraiser in the Last Year: Not on file  . Ran Out of Food in the Last Year: Not on file  Transportation Needs:   .  Lack of Transportation (Medical): Not on file  . Lack of Transportation (Non-Medical): Not on file  Physical Activity:   . Days of Exercise per Week: Not on file  . Minutes of Exercise per Session: Not on file  Stress:   . Feeling of Stress : Not on file  Social Connections:   . Frequency of Communication with Friends and Family: Not on file  . Frequency of Social Gatherings with Friends and Family: Not on file  . Attends Religious Services: Not on file  . Active Member of Clubs or Organizations: Not on file  . Attends Archivist Meetings: Not on file  . Marital Status: Not on file    Outpatient Encounter Medications as of 02/09/2020  Medication Sig  . albuterol (PROVENTIL HFA;VENTOLIN HFA) 108 (90 Base) MCG/ACT inhaler Inhale 2 puffs into the lungs every 4 (four) hours as needed for wheezing or shortness of breath.  Marland Kitchen atorvastatin (LIPITOR) 80 MG tablet Take 1 tablet (80 mg total) by mouth daily.  . budesonide-formoterol (SYMBICORT) 160-4.5 MCG/ACT inhaler Inhale 2 puffs into the lungs 2 (two) times daily.  . canagliflozin (INVOKANA) 300 MG TABS tablet Take 1 tablet (300 mg total) by mouth daily before breakfast.  . clopidogrel (PLAVIX) 75 MG tablet TAKE 1 TABLET(75 MG) BY MOUTH DAILY WITH BREAKFAST  . furosemide (LASIX) 40 MG tablet Take 0.5 tablets (20 mg total) by mouth 2 (two) times daily.  Marland Kitchen gabapentin (NEURONTIN) 600 MG tablet Take 2 tablets (1,200 mg total)  by mouth at bedtime.  Marland Kitchen glipiZIDE (GLUCOTROL) 5 MG tablet TAKE 1 TABLET BY MOUTH  DAILY BEFORE BREAKFAST  . isosorbide mononitrate (IMDUR) 60 MG 24 hr tablet Take 1 tablet (60 mg total) by mouth daily.  . metFORMIN (GLUCOPHAGE-XR) 500 MG 24 hr tablet Take 2 tablets (1,000 mg total) by mouth 2 (two) times daily.  . metoprolol succinate (TOPROL-XL) 25 MG 24 hr tablet TAKE 1 TABLET(25 MG) BY MOUTH DAILY  . pantoprazole (PROTONIX) 40 MG tablet Take 1 tablet (40 mg total) by mouth daily.  . ramipril (ALTACE) 10 MG capsule TAKE 1 CAPSULE(10 MG) BY MOUTH DAILY  . rivaroxaban (XARELTO) 20 MG TABS tablet TAKE 1 TABLET(20 MG) BY MOUTH DAILY WITH SUPPER  . diltiazem (CARDIZEM CD) 180 MG 24 hr capsule Take 1 capsule (180 mg total) by mouth daily.  . [DISCONTINUED] nystatin (MYCOSTATIN/NYSTOP) powder Apply topically 4 (four) times daily. (Patient not taking: Reported on 02/09/2020)   No facility-administered encounter medications on file as of 02/09/2020.    Activities of Daily Living In your present state of health, do you have any difficulty performing the following activities: 02/09/2020 03/08/2019  Hearing? 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 Y -  Comment eyeglasses, [facility] -  Difficulty concentrating or making decisions? N -  Walking or climbing stairs? N -  Dressing or bathing? N -  Doing errands, shopping? N Y  Conservation officer, nature and eating ? N -  Using the Toilet? N -  In the past six months, have you accidently leaked urine? N -  Do you have problems with loss of bowel control? N -  Managing your Medications? N -  Managing your Finances? N -  Housekeeping or managing your Housekeeping? N -  Some recent data might be hidden    Patient Care Team: Guadalupe Maple, MD as PCP - General (Family Medicine) Minna Merritts, MD as PCP - Cardiology (Cardiology) Alphonzo Severance  E, RPH as Pharmacist (Pharmacist)   Assessment:   This is a routine wellness  examination for [employer].  Exercise Activities and Dietary recommendations Current Exercise Habits: The patient does not participate in regular exercise at present(stays active), Exercise limited by: None identified  Goals    . DIET - INCREASE WATER INTAKE      recommend drinking at least 6-8 glasses of water a day     . DIET - REDUCE SUGAR INTAKE    . Patient Stated       Fall Risk Fall Risk  02/09/2020 02/03/2019 05/28/2018 03/13/2018 02/05/2018  Falls in the past year? 0 1 Yes Yes Yes  Comment - falls at work  - - -  Number falls in past yr: 0 1 1 2  or more 1  Injury with Fall? 0 1 Yes Yes Yes  Risk Factor Category  - - - High Fall Risk High Fall Risk  Risk for fall due to : - History of fall(s) - - -  Follow up - Falls prevention discussed;Falls evaluation completed;Education provided Falls evaluation completed Falls evaluation completed -   FALL RISK PREVENTION PERTAINING TO THE HOME:  Any stairs in or around the home? Yes  If so, are there any without handrails? No   Home free of loose throw rugs in walkways, pet beds, electrical cords, etc? Yes  Adequate lighting in your home to reduce risk of falls? Yes   ASSISTIVE DEVICES UTILIZED TO PREVENT FALLS:  Life alert? No  Use of a cane, walker or w/c? No  Grab bars in the bathroom? No  Shower chair or bench in shower? No  Elevated toilet seat or a handicapped toilet? No    TIMED UP AND GO:  Unable to perform    Depression Screen PHQ 2/9 Scores 02/09/2020 02/03/2019 05/28/2018 02/01/2018  PHQ - 2 Score 0 1 0 0    Cognitive Function     6CIT Screen 02/03/2019 02/01/2018  What Year? 0 points 0 points  What month? 0 points 0 points  What time? 0 points 0 points  Count back from 20 0 points 0 points  Months in reverse 0 points 0 points  Repeat phrase 2 points 2 points  Total Score 2 2    Immunization History  Administered Date(s) Administered  . Fluad Quad(high Dose 65+) 11/21/2019  . Influenza Split 08/25/2013  .  Influenza, High Dose Seasonal PF 09/20/2016, 11/02/2017, 10/10/2018  . Influenza,inj,Quad PF,6+ Mos 11/17/2015  . Influenza-Unspecified 12/23/2014  . Pneumococcal Conjugate-13 11/25/2015  . Pneumococcal Polysaccharide-23 10/25/2013, 02/03/2019  . Pneumococcal-Unspecified 10/26/2009  . Tdap 11/06/2012  . Zoster 02/04/2013    Qualifies for Shingles Vaccine? Yes  Zostavax completed 2014. Due for Shingrix. Education has been provided regarding the importance of this vaccine. Pt has been advised to call insurance company to determine out of pocket expense. Advised may also receive vaccine at local pharmacy or Health Dept. Verbalized acceptance and understanding.  Tdap: up to date   Flu Vaccine: up to date   Pneumococcal Vaccine: up to date   Screening Tests Health Maintenance  Topic Date Due  . FOOT EXAM  11/24/2016  . OPHTHALMOLOGY EXAM  04/20/2019  . HEMOGLOBIN A1C  04/18/2020  . TETANUS/TDAP  11/06/2022  . COLONOSCOPY  06/27/2023  . INFLUENZA VACCINE  Completed  . Hepatitis C Screening  Completed  . PNA vac Low Risk Adult  Completed   Cancer Screenings:  Colorectal Screening: Completed 2019 Repeat every 5 years  Lung Cancer Screening: (  Low Dose CT Chest recommended if Age 25-80 years, 30 pack-year currently smoking OR have quit w/in 15years.) does not qualify.    Additional Screening:  Hepatitis C Screening: does not qualify; Completed 2016  Dental Screening: Recommended annual dental exams for proper oral hygiene  Community Resource Referral:  CRR required this visit?  No        Plan:  I have personally reviewed and addressed the Medicare Annual Wellness questionnaire and have noted the following in the patient's chart:  A. Medical and social history B. Use of alcohol, tobacco or illicit drugs  C. Current medications and supplements D. Functional ability and status E.  Nutritional status F.  Physical activity G. Advance directives H. List of other  physicians I.  Hospitalizations, surgeries, and ER visits in previous 12 months J.  [place] such as hearing and vision if needed, cognitive and depression L. Referrals and appointments   In addition, I have reviewed and discussed with patient certain preventive protocols, quality metrics, and best practice recommendations. A written personalized care plan for preventive services as well as general preventive health recommendations were provided to patient.   Signed,   Bevelyn Ngo, LPN  [phone] Nurse Health Advisor  Nurse Notes: Patient states he has dropped pants sizes from 44 to 40 in the last 6-8 months. He does not have a scale to weight himself to see what his current weight is. He states he only eats one meal a day, and does not have an appetite. He denies any nausea vomiting or diarrhea. He has tried ensure and boost and does not like them. Is already scheduled to see CCM pharmacisit but Will place referral to nurse case manager from [facility] team as well for nutrition.

## 2020-02-09 NOTE — Chronic Care Management (AMB) (Signed)
  Chronic Care Management   Note  02/09/2020 Name: Ernest [name] MRN: [id number] DOB: 18-Aug-1951  Ernest [name] is a 69 y.o. year old male who is a primary care patient of Crissman, Jeannette How, MD. Ernest [name] is currently enrolled in care management services. An additional referral for RN Case manager was placed.   Follow up plan: Unsuccessful telephone outreach attempt made. A HIPPA compliant phone message was left for the patient providing contact information and requesting a return call.  The care management team will reach out to the patient again over the next 7 days.  If patient returns call to provider office, please advise to call Embedded Care Management Care Guide Ernest Naqvi LPN at [phone]  Ernest Kingry Jameer, LPN Health Advisor, [facility] ??[name] ??[phone]

## 2020-02-09 NOTE — Telephone Encounter (Signed)
-----   Message from De Hollingshead, [facility] sent at 02/09/2020  9:59 AM EST ----- No, not necessary to have his labs done before my appointment.   Thanks!  Catie ----- Message ----- From: Bevelyn Ngo, LPN Sent: [phone]   9:02 AM EST To: De Hollingshead, [facility]  Good Morning,  Patient stated you mentioned he might need to come into the office for an A1c check before his phone call with you in March. He does have an appt with Dr.Johnson in April, just checking if you needed it before your call with him or if you wanted him to wait until his appointment?   Thanks,  [employer]

## 2020-02-09 NOTE — Telephone Encounter (Signed)
Called and informed patient he did not need labwork prior to call with pharmacist in March. Will check at his appointment in April. Patient verbalized understanding

## 2020-02-09 NOTE — Patient Instructions (Signed)
[name] , Thank you for taking time to come for your Medicare Wellness Visit. I appreciate your ongoing commitment to your health goals. Please review the following plan we discussed and let me know if I can assist you in the future.   Screening recommendations/referrals: Colonoscopy: completed 2019 Recommended yearly ophthalmology/optometry visit for glaucoma screening and checkup Recommended yearly dental visit for hygiene and checkup  Vaccinations: Influenza vaccine: up to date  Pneumococcal vaccine: up to date  Tdap vaccine: up to date  Shingles vaccine: shingrix eligible  Covid-19: We are recommending the vaccine to everyone who has not had an allergic reaction to any of the components of the vaccine. If you have specific questions about the vaccine, please bring them up with your health care provider to discuss them.   We will likely not be getting the vaccine in the office for the first rounds of vaccinations. The way they are releasing the vaccines is going to be through the health systems (like [facility], [facility], [facility], [facility]), through your county health department, or through the pharmacies.   The [facility] Department is giving vaccines to those 65+ and [employer] and [place] providers start 02/11/20 Call [phone] to schedule  If you are 65+ you can get a vaccine through [facility] by signing up for an appointment.  You can sign up by going to: [email or web address].  You can get more information by going to: [email or web address]  [employer] next door is giving the [employer]- you can call [phone] or stop by there to schedule.    Advanced directives: please pick up a copy of this information next time you are in the office   Conditions/risks identified: diabetic, discussed chronic care management team. Pharmacist has already been contacted.   Next appointment: Follow up in one year for your annual wellness  visit   Preventive Care 65 Years and Older, Male Preventive care refers to lifestyle choices and visits with your health care provider that can promote health and wellness. What does preventive care include?  A yearly physical exam. This is also called an annual well check.  Dental exams once or twice a year.  Routine eye exams. Ask your health care provider how often you should have your eyes checked.  Personal lifestyle choices, including:  Daily care of your teeth and gums.  Regular physical activity.  Eating a healthy diet.  Avoiding tobacco and drug use.  Limiting alcohol use.  Practicing safe sex.  Taking low doses of aspirin every day.  Taking vitamin and mineral supplements as recommended by your health care provider. What happens during an annual well check? The services and screenings done by your health care provider during your annual well check will depend on your age, overall health, lifestyle risk factors, and family history of disease. Counseling  Your health care provider may ask you questions about your:  Alcohol use.  Tobacco use.  Drug use.  Emotional well-being.  Home and relationship well-being.  Sexual activity.  Eating habits.  History of falls.  Memory and ability to understand (cognition).  Work and work Statistician. Screening  You may have the following tests or measurements:  Height, weight, and BMI.  Blood pressure.  Lipid and cholesterol levels. These may be checked every 5 years, or more frequently if you are over 14 years old.  Skin check.  Lung cancer screening. You may have this screening every year starting at age 17 if you have a 30-pack-year history  of smoking and currently smoke or have quit within the past 15 years.  Fecal occult blood test (FOBT) of the stool. You may have this test every year starting at age 32.  Flexible sigmoidoscopy or colonoscopy. You may have a sigmoidoscopy every 5 years or a  colonoscopy every 10 years starting at age 13.  Prostate cancer screening. Recommendations will vary depending on your family history and other risks.  Hepatitis C blood test.  Hepatitis B blood test.  Sexually transmitted disease (STD) testing.  Diabetes screening. This is done by checking your blood sugar (glucose) after you have not eaten for a while (fasting). You may have this done every 1-3 years.  Abdominal aortic aneurysm (AAA) screening. You may need this if you are a current or former smoker.  Osteoporosis. You may be screened starting at age 57 if you are at high risk. Talk with your health care provider about your test results, treatment options, and if necessary, the need for more tests. Vaccines  Your health care provider may recommend certain vaccines, such as:  Influenza vaccine. This is recommended every year.  Tetanus, diphtheria, and acellular pertussis (Tdap, Td) vaccine. You may need a Td booster every 10 years.  Zoster vaccine. You may need this after age 61.  Pneumococcal 13-valent conjugate (PCV13) vaccine. One dose is recommended after age 45.  Pneumococcal polysaccharide (PPSV23) vaccine. One dose is recommended after age 34. Talk to your health care provider about which screenings and vaccines you need and how often you need them. This information is not intended to replace advice given to you by your health care provider. Make sure you discuss any questions you have with your health care provider. Document Released: 12/31/2015 Document Revised: 08/23/2016 Document Reviewed: 10/05/2015 Elsevier Interactive Patient Education  2017 [place] Prevention in the Home Falls can cause injuries. They can happen to people of all ages. There are many things you can do to make your home safe and to help prevent falls. What can I do on the outside of my home?  Regularly fix the edges of walkways and driveways and fix any cracks.  Remove anything that  might make you trip as you walk through a door, such as a raised step or threshold.  Trim any bushes or trees on the path to your home.  Use bright outdoor lighting.  Clear any walking paths of anything that might make someone trip, such as rocks or tools.  Regularly check to see if handrails are loose or broken. Make sure that both sides of any steps have handrails.  Any raised decks and porches should have guardrails on the edges.  Have any leaves, snow, or ice cleared regularly.  Use sand or salt on walking paths during winter.  Clean up any spills in your garage right away. This includes oil or grease spills. What can I do in the bathroom?  Use night lights.  Install grab bars by the toilet and in the tub and shower. Do not use towel bars as grab bars.  Use non-skid mats or decals in the tub or shower.  If you need to sit down in the shower, use a plastic, non-slip stool.  Keep the floor dry. Clean up any water that spills on the floor as soon as it happens.  Remove soap buildup in the tub or shower regularly.  Attach bath mats securely with double-sided non-slip rug tape.  Do not have throw rugs and other things on the floor  that can make you trip. What can I do in the bedroom?  Use night lights.  Make sure that you have a light by your bed that is easy to reach.  Do not use any sheets or blankets that are too big for your bed. They should not hang down onto the floor.  Have a firm chair that has side arms. You can use this for support while you get dressed.  Do not have throw rugs and other things on the floor that can make you trip. What can I do in the kitchen?  Clean up any spills right away.  Avoid walking on wet floors.  Keep items that you use a lot in easy-to-reach places.  If you need to reach something above you, use a strong step stool that has a grab bar.  Keep electrical cords out of the way.  Do not use floor polish or wax that makes floors  slippery. If you must use wax, use non-skid floor wax.  Do not have throw rugs and other things on the floor that can make you trip. What can I do with my stairs?  Do not leave any items on the stairs.  Make sure that there are handrails on both sides of the stairs and use them. Fix handrails that are broken or loose. Make sure that handrails are as long as the stairways.  Check any carpeting to make sure that it is firmly attached to the stairs. Fix any carpet that is loose or worn.  Avoid having throw rugs at the top or bottom of the stairs. If you do have throw rugs, attach them to the floor with carpet tape.  Make sure that you have a light switch at the top of the stairs and the bottom of the stairs. If you do not have them, ask someone to add them for you. What else can I do to help prevent falls?  Wear shoes that:  Do not have high heels.  Have rubber bottoms.  Are comfortable and fit you well.  Are closed at the toe. Do not wear sandals.  If you use a stepladder:  Make sure that it is fully opened. Do not climb a closed stepladder.  Make sure that both sides of the stepladder are locked into place.  Ask someone to hold it for you, if possible.  Clearly mark and make sure that you can see:  Any grab bars or handrails.  First and last steps.  Where the edge of each step is.  Use tools that help you move around (mobility aids) if they are needed. These include:  Canes.  Walkers.  Scooters.  Crutches.  Turn on the lights when you go into a dark area. Replace any light bulbs as soon as they burn out.  Set up your furniture so you have a clear path. Avoid moving your furniture around.  If any of your floors are uneven, fix them.  If there are any pets around you, be aware of where they are.  Review your medicines with your doctor. Some medicines can make you feel dizzy. This can increase your chance of falling. Ask your doctor what other things that you  can do to help prevent falls. This information is not intended to replace advice given to you by your health care provider. Make sure you discuss any questions you have with your health care provider. Document Released: 09/30/2009 Document Revised: 05/11/2016 Document Reviewed: 01/08/2015 Elsevier Interactive Patient Education  2017 Elsevier  Inc. 

## 2020-02-16 ENCOUNTER — Telehealth: Payer: Self-pay | Admitting: Family Medicine

## 2020-02-16 NOTE — Chronic Care Management (AMB) (Signed)
  Chronic Care Management   Note  02/16/2020 Name: Ernest [name] MRN: [id number] DOB: [birth year]  Ernest [name] is a 69 y.o. year old male who is a primary care patient of Crissman, Jeannette How, MD. Ernest [name] is currently enrolled in care management services. An additional referral for RN Case manager was placed.   Follow up plan: Telephone appointment with care management team member scheduled for:04/02/2020  Glenna Durand, LPN Health Advisor, [place] Management ??[email or web address] ??[phone]

## 2020-02-23 ENCOUNTER — Other Ambulatory Visit: Payer: Self-pay

## 2020-02-23 DIAGNOSIS — I482 Chronic atrial fibrillation, unspecified: Secondary | ICD-10-CM

## 2020-02-23 MED ORDER — RIVAROXABAN 20 MG PO TABS: ORAL_TABLET | ORAL | 0 refills | Status: DC

## 2020-02-23 MED ORDER — ATORVASTATIN CALCIUM 80 MG PO TABS: 80.0000 mg | ORAL_TABLET | Freq: Every day | ORAL | 0 refills | Status: DC

## 2020-02-23 NOTE — Telephone Encounter (Signed)
Patient states he does not have enough until appointment in April. Also stated he would be out of Xarelto as well.

## 2020-02-23 NOTE — Telephone Encounter (Signed)
Called and LVM asking for patient to please return my call.  

## 2020-02-23 NOTE — Telephone Encounter (Signed)
Please see if he has enough to get to his appointment

## 2020-02-23 NOTE — Telephone Encounter (Signed)
LOV: 01/12/2020, NOV: 04/12/2020 with Park Liter, DO.

## 2020-02-24 ENCOUNTER — Ambulatory Visit (INDEPENDENT_AMBULATORY_CARE_PROVIDER_SITE_OTHER): Payer: Medicare HMO | Admitting: Pharmacist

## 2020-02-24 DIAGNOSIS — E1122 Type 2 diabetes mellitus with diabetic chronic kidney disease: Secondary | ICD-10-CM | POA: Diagnosis not present

## 2020-02-24 DIAGNOSIS — J439 Emphysema, unspecified: Secondary | ICD-10-CM

## 2020-02-24 DIAGNOSIS — I129 Hypertensive chronic kidney disease with stage 1 through stage 4 chronic kidney disease, or unspecified chronic kidney disease: Secondary | ICD-10-CM

## 2020-02-24 DIAGNOSIS — I25111 Atherosclerotic heart disease of native coronary artery with angina pectoris with documented spasm: Secondary | ICD-10-CM | POA: Diagnosis not present

## 2020-02-24 DIAGNOSIS — N183 Chronic kidney disease, stage 3 unspecified: Secondary | ICD-10-CM

## 2020-02-24 NOTE — Patient Instructions (Signed)
Visit Information  Goals Addressed            This Visit's Progress     Patient Stated   . PharmD "My medications are expensive" (pt-stated)       CARE PLAN ENTRY (see longtitudinal plan of care for additional care plan information)  Current Barriers:  . Financial barriers - notes he has not worked in the past month, and he has a difficult time affording his Xarelto, Invokana, and inhalers. Has been using Symbicort PRN, as can't afford to take daily  . Diabetes: uncontrolled; complicated by chronic medical conditions including hx Aib, CAD, COPD, most recent A1c >14% . Most recent eGFR: ~76 . Current antihyperglycemic regimen: metformin XR 1000 mg BID, glipizide 5 mg w/ supper, Invokana 300 mg QAM o Notes he sometimes forgets AM dose of metformin, notes he "isn't a morning person" . Denies hypoglycemic symptoms, including dizziness, lightheadedness, shaking, sweating . Denies hyperglycemic symptoms, including polyuria, polydipsia, polyphagia, nocturia, blurred vision, neuropathy. Denies N/V. Marland Kitchen Current meal patterns: generally only eating supper . Current exercise: none; works w/ driving trucks . Current blood glucose readings: has not been checking; notes his glucometer read >300 the other day, and after a cupcake read HIGH . Cardiovascular risk reduction: o Current hypertensive regimen: furosemide 20 mg daily, diltiazem 180 mg daily, metoprolol succinate 25 mg daily, ramipril 10 mg daily, isosorbide 60 mg daily o Current hyperlipidemia regimen: atorvastatin 80 mg daily o Current antiplatelet regimen: clopidogrel 75 mg daily + Xarelto 20 mg daily  . COPD: previous followed w/ pulmonary. Last spirometry in 2015 showed normal spirometry, Dr. Zoila Shutter note in 2019 indicated some components of interstitial lung disease w/ underlying COPD.   Pharmacist Clinical Goal(s):  Marland Kitchen Over the next 90 days, patient will work with PharmD and primary care provider to address optimized medication  management  Interventions: . Comprehensive medication review performed, medication list updated in electronic medical record . Extensive review of goal A1c, goal fasting, and goal post prandial glucose. Encouraged more BG checks to evaluate control  . Will address medication access. Confirms that he does have metformin, glipizide, and Invokana right now. May qualify for [place] assistance, pending how much he has spent on copays at the pharmacy. He will obtain this information and provide for me. Moving forward, consider GLP1 vs insulin.  . Patient may qualify for Xarelto assistance, pending how much he has spent at the pharmacy on copays. He will provide this information to me.  . Will discuss w/ Dr. Wynetta Emery w/ components of COPD and ILD whether to continue ICS/LABA tx or switch to LABA/LAMA. Pending how much patient has spent out of pocket, could consider Anoro vs Stiolto. Will collaborate w/ Dr. Wynetta Emery on the appropriate prescription signatures once we know his drug spend and how to proceed.  . Counseled that he could take all 4 metformin tablets together in the evenings, if this improves adherence. Patient verbalized understanding.   Patient Self Care Activities:  . Patient will check blood glucose daily, document, and provide at future appointments . Patient will take medications as prescribed . Patient will report any questions or concerns to provider   Initial goal documentation        Patient verbalizes understanding of instructions provided today.   Plan: - Will collaborate w/ patient and provider as above  Catie Darnelle Maffucci, PharmD, [place] [phone]

## 2020-02-24 NOTE — Chronic Care Management (AMB) (Signed)
Chronic Care Management   Note  02/24/2020 Name: Ernest [name] MRN: [id number] DOB: 1951/03/05   Subjective:  Ernest [name] is a 69 y.o. year old male who is a primary care patient of Crissman, Jeannette How, MD. The CCM team was consulted for assistance with chronic disease management and care coordination needs.    Contacted patient for medication management review.   Review of patient status, including review of consultants reports, laboratory and other test data, was performed as part of comprehensive evaluation and provision of chronic care management services.   SDOH (Social Determinants of Health) assessments and interventions performed:  SDOH Interventions     Most Recent Value  SDOH Interventions  SDOH Interventions for the Following Domains  Financial Strain  Financial Strain Interventions  Other (Comment) [patient assistance applications]       Objective:  Lab Results  Component Value Date   CREATININE 1.01 11/21/2019   CREATININE 1.21 10/20/2019   CREATININE 1.02 06/02/2019    Lab Results  Component Value Date   HGBA1C >14.0 (H) 10/20/2019       Component Value Date/Time   CHOL 143 10/20/2019 1352   CHOL 195 08/01/2017 0902   CHOL 104 07/25/2014 0425   TRIG 502 (H) 10/20/2019 1352   TRIG 268 (H) 08/01/2017 0902   TRIG 243 (H) 07/25/2014 0425   TRIG 249 05/27/2010 0000   HDL 26 (L) 10/20/2019 1352   HDL 25 (L) 07/25/2014 0425   CHOLHDL 3.7 01/30/2017 1410   VLDL 54 (H) 08/01/2017 0902   VLDL 49 (H) 07/25/2014 0425   LDLCALC 44 10/20/2019 1352   LDLCALC 30 07/25/2014 0425    Clinical ASCVD: Yes  The ASCVD Risk score (Goff DC Jr., et al., 2013) failed to calculate for the following reasons:   The patient has a prior MI or stroke diagnosis    BP Readings from Last 3 Encounters:  10/20/19 (!) 148/82  03/08/19 (!) 142/81  02/04/19 137/75    Allergies  Allergen Reactions  . Doxycycline Other (See Comments)    Mouth sores     Medications  Reviewed Today    Reviewed by De Hollingshead, [facility] (Pharmacist) on 02/24/20 at 1556  Med List Status: <None>  Medication Order Taking? Sig Documenting Provider Last Dose Status Informant  albuterol (PROVENTIL HFA;VENTOLIN HFA) 108 (90 Base) MCG/ACT inhaler [id number] No Inhale 2 puffs into the lungs every 4 (four) hours as needed for wheezing or shortness of breath.  Patient not taking: Reported on 02/24/2020   Guadalupe Maple, MD Not Taking Active Self  atorvastatin (LIPITOR) 80 MG tablet [id number] Yes Take 1 tablet (80 mg total) by mouth daily. Park Liter P, DO Taking Active   budesonide-formoterol [facility]) 160-4.5 MCG/ACT inhaler [id number] Yes Inhale 2 puffs into the lungs 2 (two) times daily. Volney American, [place] Taking Active Self           Med Note ([place] Feb 24, 2020  3:56 PM) PRN   canagliflozin (INVOKANA) 300 MG TABS tablet [id number] Yes Take 1 tablet (300 mg total) by mouth daily before breakfast. Valerie Roys, DO Taking Active   clopidogrel (PLAVIX) 75 MG tablet [id number] Yes TAKE 1 TABLET(75 MG) BY MOUTH DAILY WITH BREAKFAST Johnson, Megan P, DO Taking Active   diltiazem (CARDIZEM CD) 180 MG 24 hr capsule [id number] Yes Take 1 capsule (180 mg total) by mouth daily. Theora Gianotti, NP Taking Active   furosemide (LASIX) 40 MG tablet  [id number] Yes Take 0.5 tablets (20 mg total) by mouth 2 (two) times daily. Johnson, Megan P, DO Taking Active   gabapentin (NEURONTIN) 600 MG tablet [id number] Yes Take 2 tablets (1,200 mg total) by mouth at bedtime. Johnson, Megan P, DO Taking Active   glipiZIDE (GLUCOTROL) 5 MG tablet [id number] Yes TAKE 1 TABLET BY MOUTH  DAILY BEFORE BREAKFAST Johnson, Megan P, DO Taking Active   isosorbide mononitrate (IMDUR) 60 MG 24 hr tablet [id number] Yes Take 1 tablet (60 mg total) by mouth daily. Minna Merritts, MD Taking Active   metFORMIN (GLUCOPHAGE-XR) 500 MG 24 hr tablet [id number] Yes Take 2 tablets (1,000 mg  total) by mouth 2 (two) times daily. Park Liter P, DO Taking Active   metoprolol succinate (TOPROL-XL) 25 MG 24 hr tablet [id number] Yes TAKE 1 TABLET(25 MG) BY MOUTH DAILY Johnson, Megan P, DO Taking Active   pantoprazole (PROTONIX) 40 MG tablet [id number] No Take 1 tablet (40 mg total) by mouth daily.  Patient not taking: Reported on 02/24/2020   Valerie Roys, DO Not Taking Active            Med Note Lula Olszewski Feb 24, 2020  3:54 PM) Omeprazole OTC  ramipril (ALTACE) 10 MG capsule [id number] Yes TAKE 1 CAPSULE(10 MG) BY MOUTH DAILY Johnson, Megan P, DO Taking Active   rivaroxaban (XARELTO) 20 MG TABS tablet [id number] Yes TAKE 1 TABLET(20 MG) BY MOUTH DAILY WITH SUPPER Wynetta Emery, Megan P, DO Taking Active            Assessment:   Goals Addressed            This Visit's Progress     Patient Stated   . PharmD "My medications are expensive" (pt-stated)       CARE PLAN ENTRY (see longtitudinal plan of care for additional care plan information)  Current Barriers:  . Financial barriers - notes he has not worked in the past month, and he has a difficult time affording his Xarelto, Invokana, and inhalers. Has been using Symbicort PRN, as can't afford to take daily  . Diabetes: uncontrolled; complicated by chronic medical conditions including hx Aib, CAD, COPD, most recent A1c >14% . Most recent eGFR: ~76 . Current antihyperglycemic regimen: metformin XR 1000 mg BID, glipizide 5 mg w/ supper, Invokana 300 mg QAM o Notes he sometimes forgets AM dose of metformin, notes he "isn't a morning person" . Denies hypoglycemic symptoms, including dizziness, lightheadedness, shaking, sweating . Denies hyperglycemic symptoms, including polyuria, polydipsia, polyphagia, nocturia, blurred vision, neuropathy. Denies N/V. Marland Kitchen Current meal patterns: generally only eating supper . Current exercise: none; works w/ driving trucks . Current blood glucose readings: has not been checking; notes  his glucometer read >300 the other day, and after a cupcake read HIGH . Cardiovascular risk reduction: o Current hypertensive regimen: furosemide 20 mg daily, diltiazem 180 mg daily, metoprolol succinate 25 mg daily, ramipril 10 mg daily, isosorbide 60 mg daily o Current hyperlipidemia regimen: atorvastatin 80 mg daily o Current antiplatelet regimen: clopidogrel 75 mg daily + Xarelto 20 mg daily  . COPD: previous followed w/ pulmonary. Last spirometry in 2015 showed normal spirometry, Dr. Zoila Shutter note in 2019 indicated some components of interstitial lung disease w/ underlying COPD.   Pharmacist Clinical Goal(s):  Marland Kitchen Over the next 90 days, patient will work with PharmD and primary care provider to address optimized medication management  Interventions: . Comprehensive medication review performed, medication list updated in electronic medical record .  Extensive review of goal A1c, goal fasting, and goal post prandial glucose. Encouraged more BG checks to evaluate control  . Will address medication access. Confirms that he does have metformin, glipizide, and Invokana right now. May qualify for [place] assistance, pending how much he has spent on copays at the pharmacy. He will obtain this information and provide for me. Moving forward, consider GLP1 vs insulin.  . Patient may qualify for Xarelto assistance, pending how much he has spent at the pharmacy on copays. He will provide this information to me.  . Will discuss w/ Dr. Wynetta Emery w/ components of COPD and ILD whether to continue ICS/LABA tx or switch to LABA/LAMA. Pending how much patient has spent out of pocket, could consider Anoro vs Stiolto. Will collaborate w/ Dr. Wynetta Emery on the appropriate prescription signatures once we know his drug spend and how to proceed.  . Counseled that he could take all 4 metformin tablets together in the evenings, if this improves adherence. Patient verbalized understanding.   Patient Self Care Activities:  .  Patient will check blood glucose daily, document, and provide at future appointments . Patient will take medications as prescribed . Patient will report any questions or concerns to provider   Initial goal documentation        Plan: - Will collaborate w/ patient and provider as above  Catie Darnelle Maffucci, PharmD, [place] [phone]

## 2020-03-17 ENCOUNTER — Ambulatory Visit: Payer: Self-pay | Admitting: Pharmacist

## 2020-03-17 ENCOUNTER — Telehealth: Payer: Self-pay | Admitting: Family Medicine

## 2020-03-17 NOTE — Chronic Care Management (AMB) (Signed)
  Chronic Care Management   Note  03/17/2020 Name: Ernest [name] MRN: [id number] DOB: [birth year]  [name] is a 69 y.o. year old male who is a primary care patient of Crissman, Jeannette How, MD. The CCM team was consulted for assistance with chronic disease management and care coordination needs.    Attempted to contact patient to follow up on medication access needs. He was to bring by a copy of his total drug spend and sign patient assistance applications. Have not received any of this yet.   Left voicemail for him to do so, and to call me back with any questions.   Follow up plan: - Will await action from patient  Catie Darnelle Maffucci, PharmD, [place] [phone]

## 2020-03-17 NOTE — Telephone Encounter (Signed)
I did not call him prior to the below documentation, but will address.

## 2020-03-17 NOTE — Telephone Encounter (Signed)
Routing to pharm D to follow up on financial assistance paperwork.   Copied from [place] [phone]. Topic: General - Other >> Mar 16, 2020  3:29 PM Ernest [name] wrote: Reason for CRM: pt called and stated that he missed a call regarding his medication. Pt does not know who it was. Please advise

## 2020-03-28 ENCOUNTER — Other Ambulatory Visit: Payer: Self-pay | Admitting: Family Medicine

## 2020-03-28 NOTE — Telephone Encounter (Signed)
Requested Prescriptions  Pending Prescriptions Disp Refills  . clopidogrel (PLAVIX) 75 MG tablet [Pharmacy Med Name: CLOPIDOGREL 75MG  TABLETS] 90 tablet 0    Sig: TAKE 1 TABLET(75 MG) BY MOUTH DAILY WITH BREAKFAST     Hematology: Antiplatelets - clopidogrel Failed - 03/28/2020  8:10 AM      Failed - Evaluate AST, ALT within 2 months of therapy initiation.      Failed - HCT in normal range and within 180 days    Hematocrit  Date Value Ref Range Status  10/20/2019 51.4 (H) 37.5 - 51.0 % Final         Passed - ALT in normal range and within 360 days    ALT  Date Value Ref Range Status  11/21/2019 20 0 - 44 IU/L Final   ALT (SGPT) Piccolo, Waived  Date Value Ref Range Status  08/01/2017 33 10 - 47 U/L Final         Passed - AST in normal range and within 360 days    AST  Date Value Ref Range Status  11/21/2019 22 0 - 40 IU/L Final   AST (SGOT) Piccolo, Waived  Date Value Ref Range Status  08/01/2017 34 11 - 38 U/L Final         Passed - HGB in normal range and within 180 days    Hemoglobin  Date Value Ref Range Status  10/20/2019 17.4 13.0 - 17.7 g/dL Final         Passed - PLT in normal range and within 180 days    Platelets  Date Value Ref Range Status  10/20/2019 194 150 - 450 x10E3/uL Final         Passed - Valid encounter within last 6 months    Recent Outpatient Visits          2 months ago Suspected COVID-19 virus infection   [facility] [place], Megan P, DO   4 months ago Type 2 DM with CKD stage 3 and hypertension ([place])   [place], Megan P, DO   5 months ago Yeast infection   [employer], Megan P, DO   10 months ago Essential hypertension   [place], Jeannette How, MD   1 year ago ACS (acute coronary syndrome) [facility])   [place], Jeannette How, MD      Future Appointments            In 2 weeks Johnson, Barb Merino, DO [place], [place]   In 10 months   [employer], [place]

## 2020-04-02 ENCOUNTER — Telehealth: Payer: Medicare HMO | Admitting: General Practice

## 2020-04-02 ENCOUNTER — Ambulatory Visit (INDEPENDENT_AMBULATORY_CARE_PROVIDER_SITE_OTHER): Payer: Medicare HMO | Admitting: General Practice

## 2020-04-02 DIAGNOSIS — E782 Mixed hyperlipidemia: Secondary | ICD-10-CM | POA: Diagnosis not present

## 2020-04-02 DIAGNOSIS — N183 Chronic kidney disease, stage 3 unspecified: Secondary | ICD-10-CM | POA: Diagnosis not present

## 2020-04-02 DIAGNOSIS — E1122 Type 2 diabetes mellitus with diabetic chronic kidney disease: Secondary | ICD-10-CM | POA: Diagnosis not present

## 2020-04-02 DIAGNOSIS — I10 Essential (primary) hypertension: Secondary | ICD-10-CM | POA: Diagnosis not present

## 2020-04-02 DIAGNOSIS — J439 Emphysema, unspecified: Secondary | ICD-10-CM | POA: Diagnosis not present

## 2020-04-02 DIAGNOSIS — G2581 Restless legs syndrome: Secondary | ICD-10-CM

## 2020-04-02 DIAGNOSIS — I482 Chronic atrial fibrillation, unspecified: Secondary | ICD-10-CM | POA: Diagnosis not present

## 2020-04-02 DIAGNOSIS — I129 Hypertensive chronic kidney disease with stage 1 through stage 4 chronic kidney disease, or unspecified chronic kidney disease: Secondary | ICD-10-CM

## 2020-04-02 NOTE — Chronic Care Management (AMB) (Deleted)
Chronic Care Management   Initial Visit Note  04/02/2020 Name: Ernest [name] MRN: [id number] DOB: [birth year]  Referred by: Guadalupe Maple, MD Reason for referral : Chronic Care Management (Initial Outreach: RNCM Chronic Disease Mangement and Care coordination needs)   Ernest [name] is a 69 y.o. year old male who is a primary care patient of Crissman, Jeannette How, MD. The CCM team was consulted for assistance with chronic disease management and care coordination needs related to Atrial Fibrillation, HTN, HLD, COPD, DMII and RLS  Review of patient status, including review of consultants reports, relevant laboratory and other test results, and collaboration with appropriate care team members and the patient's provider was performed as part of comprehensive patient evaluation and provision of chronic care management services.    SDOH (Social Determinants of Health) assessments performed: {yes/no:20286} See Care Plan activities for detailed interventions related to SDOH     Medications: Outpatient Encounter Medications as of 04/02/2020  Medication Sig Note  . atorvastatin (LIPITOR) 80 MG tablet Take 1 tablet (80 mg total) by mouth daily.   . budesonide-formoterol (SYMBICORT) 160-4.5 MCG/ACT inhaler Inhale 2 puffs into the lungs 2 (two) times daily. 02/24/2020: PRN   . canagliflozin (INVOKANA) 300 MG TABS tablet Take 1 tablet (300 mg total) by mouth daily before breakfast.   . clopidogrel (PLAVIX) 75 MG tablet TAKE 1 TABLET(75 MG) BY MOUTH DAILY WITH BREAKFAST   . furosemide (LASIX) 40 MG tablet Take 0.5 tablets (20 mg total) by mouth 2 (two) times daily.   Marland Kitchen gabapentin (NEURONTIN) 600 MG tablet Take 2 tablets (1,200 mg total) by mouth at bedtime.   Marland Kitchen glipiZIDE (GLUCOTROL) 5 MG tablet TAKE 1 TABLET BY MOUTH  DAILY BEFORE BREAKFAST   . isosorbide mononitrate (IMDUR) 60 MG 24 hr tablet Take 1 tablet (60 mg total) by mouth daily.   . metFORMIN (GLUCOPHAGE-XR) 500 MG 24 hr tablet Take 2  tablets (1,000 mg total) by mouth 2 (two) times daily.   . metoprolol succinate (TOPROL-XL) 25 MG 24 hr tablet TAKE 1 TABLET(25 MG) BY MOUTH DAILY   . pantoprazole (PROTONIX) 40 MG tablet Take 1 tablet (40 mg total) by mouth daily. 02/24/2020: Omeprazole OTC  . ramipril (ALTACE) 10 MG capsule TAKE 1 CAPSULE(10 MG) BY MOUTH DAILY   . rivaroxaban (XARELTO) 20 MG TABS tablet TAKE 1 TABLET(20 MG) BY MOUTH DAILY WITH SUPPER   . albuterol (PROVENTIL HFA;VENTOLIN HFA) 108 (90 Base) MCG/ACT inhaler Inhale 2 puffs into the lungs every 4 (four) hours as needed for wheezing or shortness of breath. (Patient not taking: Reported on 02/24/2020)   . diltiazem (CARDIZEM CD) 180 MG 24 hr capsule Take 1 capsule (180 mg total) by mouth daily.    No facility-administered encounter medications on file as of 04/02/2020.     Objective:   Goals Addressed            This Visit's Progress   . RNCM: Pt-" I have not taken my blood sugars in 2 weeks" (pt-stated)       CARE PLAN ENTRY (see longitudinal plan of care for additional care plan information)  Current Barriers:  Marland Kitchen Knowledge Deficits related to the importance of managing diabetes as evidence of hemoglobin A1C of 14 in November 2020 . 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 Difficulty obtaining medications . Needs a new glucose meter to check blood sugars . Lacks motivation   Nurse Case Manager Clinical Goal(s):  .  Over the next 120 days, patient will verbalize understanding of plan for diabetes management  . Over the next 120 days, patient will work with RNCM, pcp and pharmacist to address needs related to effective diabetes management  . Over the next 120 days, patient will demonstrate a decrease in high blood sugars exacerbations as evidenced by monitoring blood sugars daily, adhering  to dietary restrictions, and following plan of care as recommended by the provider . Over the next 120 days,  patient will attend all scheduled medical appointments: appointment with pcp on 04-12-2020 . Over the next 120 days, patient will demonstrate improved adherence to prescribed treatment plan for diabetes as evidenced bychecking blood sugars daily, decrease in hemoglobin A1C, compliance with medications regimen and following the plan of care established by provider . Over the next 120 days, patient will verbalize basic understanding of diabetes disease process and self health management plan as evidenced by compliance with plan of care and decrease in blood sugar levels . Over the next 120 days, patient will demonstrate understanding of rationale for each prescribed medication as evidenced by compliance and ability to obtain medications without cost constraints  . Over the next 120 days, patient will work with CM team pharmacist to get needed help with cost of prescriptions, obtaining needed refills and being more compliant with medication regimen.   Interventions:  . Inter-disciplinary care team collaboration (see longitudinal plan of care) . Evaluation of current treatment plan related to diabetes management and patient's adherence to plan as established by provider. . Advised patient to check with pharmacy concerning needed refills. The patient states that he was there yesterday and the still do not have scripts for refills. The patient also needs a new glucose meter that his insurance provider will pay for strips and kit. Will send inbasket message to pharmacist and pcp . Provided education to patient re: on putting am pills he takes in a plastic bag and have with his clothes so he will not forget to take or set an alarm on his phone to remind him to take. The patient admits when he is working he leaves and forgets to take his medications . Reviewed medications with patient and discussed compliance and ways to remember to take medications.  Nash Dimmer with CCM team pharmacist and pcp regarding the  need for new glucose machine ( would like one where the insurance will pay for kit and strips), the need for medication scripts, the patient not taking blood glucose levels regularly and not always following dietary restrictions. . Discussed plans with patient for ongoing care management follow up and provided patient with direct contact information for care management team . Reviewed scheduled/upcoming provider appointments including: pcp appointment on 4-26 . Advised patient, providing education and rationale, to check cbg at least one time daily and record, calling pcp for findings outside established parameters.   . Education on the benefits of a heart healthy/ADA diet in controlling blood sugar levels. The patient eats out a lot and admits he eats more sweets than he should at times.   . Education on the negative impact of uncontrolled diabetes on other body systems.  The patient verbalized he needs to do better at taking care of his diabetes.  . Pharmacy referral for help with medication cost, compliance and scripts.   Patient Self Care Activities:  . Patient verbalizes understanding of plan to work with CCM team to help patient manage diabetes more effectively . Self administers medications as prescribed . Attends all  scheduled provider appointments . Calls pharmacy for medication refills . Calls provider office for new concerns or questions . Unable to independently manage diabetes as evidence of hemoglobin A1C of 14 in November 2020.  Marland Kitchen Unable to self administer medications as prescribed . Does not adhere to provider recommendations re:  Marland Kitchen Does not adhere to prescribed medication regimen . Does not contact provider office for questions/concerns  Initial goal documentation     . RNCM: Pt-"I have a lot of health issues" (pt-stated)       CARE PLAN ENTRY (see longtitudinal plan of care for additional care plan information)  Current Barriers:  . Chronic Disease Management support,  education, and care coordination needs related to Atrial Fibrillation, HTN, HLD, COPD, and RLS . Does not have blood pressure cuff to check blood sugars . No AD/LW . Needs scripts for gabapentin, albuterol inhaler, and cream for athlete's feet  Clinical Goal(s) related to Atrial Fibrillation, HTN, HLD, COPD, and RLS :  Over the next 120 days, patient will:  . Work with the care management team to address educational, disease management, and care coordination needs  . Begin or continue self health monitoring activities as directed today Measure and record blood pressure 2 times per week and adhere to a heart healthy/ADA diet . Call provider office for new or worsened signs and symptoms Blood pressure findings outside established parameters, Oxygen saturation lower than established parameter, Chest pain, Shortness of breath, and New or worsened symptom related to HLD, RLS and other chronic conditions . Call care management team with questions or concerns . Verbalize basic understanding of patient centered plan of care established today  Interventions related to Atrial Fibrillation, HTN, HLD, COPD, and RLS :  . Evaluation of current treatment plans and patient's adherence to plan as established by provider . Assessed patient understanding of disease states . Assessed patient's education and care coordination needs.  The patient needs scripts for his inhaler, gabapentin, athlete's feet and new glucose meter. Also having difficulty with medication cost.  . Provided disease specific education to patient: Education on heart healthy/ADA diet and benefits. The patient eats out a lot.   . Provided education on checking blood pressures at home. The patient does not have a blood pressure cuff. Will work on ways to get a blood pressure cuff for the patient. Plan to give patient a log book at 04-12-2020 appointment for writing down his blood pressures and blood glucose readings.  . Education on AD/LW- the  patient agrees to receive a packet to review with his daughter at his visit on 04-12-2020.  Nash Dimmer with appropriate clinical care team members regarding patient needs.  The patient has worked with the pharmacist in the past. Re referral for help with medication cost, obtaining refills, and ways to get the patient to take medication as prescribed.   Patient Self Care Activities related to Atrial Fibrillation, HTN, HLD, COPD, and RLS :  . Patient is unable to independently self-manage chronic health conditions  Initial goal documentation         Mr. Rahming was given information about Chronic Care Management services today including:  1. CCM service includes personalized support from designated clinical staff supervised by his physician, including individualized plan of care and coordination with other care providers 2. 24/7 contact phone numbers for assistance for urgent and routine care needs. 3. Service will only be billed when office clinical staff spend 20 minutes or more in a month to coordinate care. 4. Only  one practitioner may furnish and bill the service in a calendar month. 5. The patient may stop CCM services at any time (effective at the end of the month) by phone call to the office staff. 6. The patient will be responsible for cost sharing (co-pay) of up to 20% of the service fee (after annual deductible is met).  {CCM CONSENT STATUS:22234}  Plan:   {CCM FOLLOW UP IZTI:45809}  Provider Signature

## 2020-04-02 NOTE — Chronic Care Management (AMB) (Signed)
Chronic Care Management   Initial Visit Note  04/02/2020 Name: Ernest [name] MRN: [id number] DOB: 04/07/1951  Referred by: Guadalupe Maple, MD Reason for referral : Chronic Care Management (Initial Outreach: RNCM Chronic Disease Mangement and Care coordination needs)   Ernest [name] is a 69 y.o. year old male who is a primary care patient of Crissman, Jeannette How, MD. The CCM team was consulted for assistance with chronic disease management and care coordination needs related to Atrial Fibrillation, HTN, HLD, COPD, DMII and RLS  Review of patient status, including review of consultants reports, relevant laboratory and other test results, and collaboration with appropriate care team members and the patient's provider was performed as part of comprehensive patient evaluation and provision of chronic care management services.    SDOH (Social Determinants of Health) assessments performed: Yes See Care Plan activities for detailed interventions related to SDOH  SDOH Interventions     Most Recent Value  SDOH Interventions  SDOH Interventions for the Following Domains  Physical Activity, Financial Strain, Social Connections  Financial Strain Interventions  Other (Comment) [works 3 days a week, fear after not working can not pay for needs]  Physical Activity Interventions  Other (Comments) [no structured activity but stays active with his job]  Social Connections Interventions  Other (Comment) [good support system.]       Medications: Outpatient Encounter Medications as of 04/02/2020  Medication Sig Note  . atorvastatin (LIPITOR) 80 MG tablet Take 1 tablet (80 mg total) by mouth daily.   . budesonide-formoterol (SYMBICORT) 160-4.5 MCG/ACT inhaler Inhale 2 puffs into the lungs 2 (two) times daily. 02/24/2020: PRN   . canagliflozin (INVOKANA) 300 MG TABS tablet Take 1 tablet (300 mg total) by mouth daily before breakfast.   . clopidogrel (PLAVIX) 75 MG tablet TAKE 1 TABLET(75 MG) BY MOUTH  DAILY WITH BREAKFAST   . furosemide (LASIX) 40 MG tablet Take 0.5 tablets (20 mg total) by mouth 2 (two) times daily.   Marland Kitchen gabapentin (NEURONTIN) 600 MG tablet Take 2 tablets (1,200 mg total) by mouth at bedtime.   Marland Kitchen glipiZIDE (GLUCOTROL) 5 MG tablet TAKE 1 TABLET BY MOUTH  DAILY BEFORE BREAKFAST   . isosorbide mononitrate (IMDUR) 60 MG 24 hr tablet Take 1 tablet (60 mg total) by mouth daily.   . metFORMIN (GLUCOPHAGE-XR) 500 MG 24 hr tablet Take 2 tablets (1,000 mg total) by mouth 2 (two) times daily.   . metoprolol succinate (TOPROL-XL) 25 MG 24 hr tablet TAKE 1 TABLET(25 MG) BY MOUTH DAILY   . pantoprazole (PROTONIX) 40 MG tablet Take 1 tablet (40 mg total) by mouth daily. 02/24/2020: Omeprazole OTC  . ramipril (ALTACE) 10 MG capsule TAKE 1 CAPSULE(10 MG) BY MOUTH DAILY   . rivaroxaban (XARELTO) 20 MG TABS tablet TAKE 1 TABLET(20 MG) BY MOUTH DAILY WITH SUPPER   . albuterol (PROVENTIL HFA;VENTOLIN HFA) 108 (90 Base) MCG/ACT inhaler Inhale 2 puffs into the lungs every 4 (four) hours as needed for wheezing or shortness of breath. (Patient not taking: Reported on 02/24/2020)   . diltiazem (CARDIZEM CD) 180 MG 24 hr capsule Take 1 capsule (180 mg total) by mouth daily.    No facility-administered encounter medications on file as of 04/02/2020.     Objective:  BP Readings from Last 3 Encounters:  10/20/19 (!) 148/82  03/08/19 (!) 142/81  02/04/19 137/75   Lab Results  Component Value Date   HGBA1C >14.0 (H) 10/20/2019    Goals Addressed  This Visit's Progress   . RNCM: Pt-" I have not taken my blood sugars in 2 weeks" (pt-stated)       CARE PLAN ENTRY (see longitudinal plan of care for additional care plan information)  Current Barriers:  Marland Kitchen Knowledge Deficits related to the importance of managing diabetes as evidence of hemoglobin A1C of 14 in November 2020 . 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 Difficulty  obtaining medications . Needs a new glucose meter to check blood sugars . Lacks motivation   Nurse Case Manager Clinical Goal(s):  Marland Kitchen Over the next 120 days, patient will verbalize understanding of plan for diabetes management  . Over the next 120 days, patient will work with RNCM, pcp and pharmacist to address needs related to effective diabetes management  . Over the next 120 days, patient will demonstrate a decrease in high blood sugars exacerbations as evidenced by monitoring blood sugars daily, adhering  to dietary restrictions, and following plan of care as recommended by the provider . Over the next 120 days, patient will attend all scheduled medical appointments: appointment with pcp on 04-12-2020 . Over the next 120 days, patient will demonstrate improved adherence to prescribed treatment plan for diabetes as evidenced bychecking blood sugars daily, decrease in hemoglobin A1C, compliance with medications regimen and following the plan of care established by provider . Over the next 120 days, patient will verbalize basic understanding of diabetes disease process and self health management plan as evidenced by compliance with plan of care and decrease in blood sugar levels . Over the next 120 days, patient will demonstrate understanding of rationale for each prescribed medication as evidenced by compliance and ability to obtain medications without cost constraints  . Over the next 120 days, patient will work with CM team pharmacist to get needed help with cost of prescriptions, obtaining needed refills and being more compliant with medication regimen.   Interventions:  . Inter-disciplinary care team collaboration (see longitudinal plan of care) . Evaluation of current treatment plan related to diabetes management and patient's adherence to plan as established by provider. . Advised patient to check with pharmacy concerning needed refills. The patient states that he was there yesterday and the  still do not have scripts for refills. The patient also needs a new glucose meter that his insurance provider will pay for strips and kit. Will send inbasket message to pharmacist and pcp . Provided education to patient re: on putting am pills he takes in a plastic bag and have with his clothes so he will not forget to take or set an alarm on his phone to remind him to take. The patient admits when he is working he leaves and forgets to take his medications . Reviewed medications with patient and discussed compliance and ways to remember to take medications.  Nash Dimmer with CCM team pharmacist and pcp regarding the need for new glucose machine ( would like one where the insurance will pay for kit and strips), the need for medication scripts, the patient not taking blood glucose levels regularly and not always following dietary restrictions. . Discussed plans with patient for ongoing care management follow up and provided patient with direct contact information for care management team . Reviewed scheduled/upcoming provider appointments including: pcp appointment on 4-26 . Advised patient, providing education and rationale, to check cbg at least one time daily and record, calling pcp for findings outside established parameters.   . Education on the benefits  of a heart healthy/ADA diet in controlling blood sugar levels. The patient eats out a lot and admits he eats more sweets than he should at times.   . Education on the negative impact of uncontrolled diabetes on other body systems.  The patient verbalized he needs to do better at taking care of his diabetes.  . Pharmacy referral for help with medication cost, compliance and scripts.   Patient Self Care Activities:  . Patient verbalizes understanding of plan to work with CCM team to help patient manage diabetes more effectively . Self administers medications as prescribed . Attends all scheduled provider appointments . Calls pharmacy for  medication refills . Calls provider office for new concerns or questions . Unable to independently manage diabetes as evidence of hemoglobin A1C of 14 in November 2020.  Marland Kitchen Unable to self administer medications as prescribed . Does not adhere to provider recommendations re:  Marland Kitchen Does not adhere to prescribed medication regimen . Does not contact provider office for questions/concerns  Initial goal documentation     . RNCM: Pt-"I have a lot of health issues" (pt-stated)       CARE PLAN ENTRY (see longtitudinal plan of care for additional care plan information)  Current Barriers:  . Chronic Disease Management support, education, and care coordination needs related to Atrial Fibrillation, HTN, HLD, COPD, and RLS . Does not have blood pressure cuff to check blood sugars . No AD/LW . Needs scripts for gabapentin, albuterol inhaler, and cream for athlete's feet  Clinical Goal(s) related to Atrial Fibrillation, HTN, HLD, COPD, and RLS :  Over the next 120 days, patient will:  . Work with the care management team to address educational, disease management, and care coordination needs  . Begin or continue self health monitoring activities as directed today Measure and record blood pressure 2 times per week and adhere to a heart healthy/ADA diet . Call provider office for new or worsened signs and symptoms Blood pressure findings outside established parameters, Oxygen saturation lower than established parameter, Chest pain, Shortness of breath, and New or worsened symptom related to HLD, RLS and other chronic conditions . Call care management team with questions or concerns . Verbalize basic understanding of patient centered plan of care established today  Interventions related to Atrial Fibrillation, HTN, HLD, COPD, and RLS :  . Evaluation of current treatment plans and patient's adherence to plan as established by provider . Assessed patient understanding of disease states . Assessed patient's  education and care coordination needs.  The patient needs scripts for his inhaler, gabapentin, athlete's feet and new glucose meter. Also having difficulty with medication cost.  . Assessed concern of RLS being a big issue for him right now. He says the pharmacist has not received the script for gabapentin. In basket message to the pharmacist and pcp.  . Provided disease specific education to patient: Education on heart healthy/ADA diet and benefits. The patient eats out a lot.   . Provided education on checking blood pressures at home. The patient does not have a blood pressure cuff. Will work on ways to get a blood pressure cuff for the patient. Plan to give patient a log book at 04-12-2020 appointment for writing down his blood pressures and blood glucose readings.  . Education on AD/LW- the patient agrees to receive a packet to review with his daughter at his visit on 04-12-2020.  Nash Dimmer with appropriate clinical care team members regarding patient needs.  The patient has worked with the pharmacist  in the past. Re referral for help with medication cost, obtaining refills, and ways to get the patient to take medication as prescribed.   Patient Self Care Activities related to Atrial Fibrillation, HTN, HLD, COPD, and RLS :  . Patient is unable to independently self-manage chronic health conditions  Initial goal documentation         Mr. Spofford was given information about Chronic Care Management services today including:  1. CCM service includes personalized support from designated clinical staff supervised by his physician, including individualized plan of care and coordination with other care providers 2. 24/7 contact phone numbers for assistance for urgent and routine care needs. 3. Service will only be billed when office clinical staff spend 20 minutes or more in a month to coordinate care. 4. Only one practitioner may furnish and bill the service in a calendar month. 5. The patient  may stop CCM services at any time (effective at the end of the month) by phone call to the office staff. 6. The patient will be responsible for cost sharing (co-pay) of up to 20% of the service fee (after annual deductible is met).  Patient agreed to services and verbal consent obtained.   Plan:   The care management team will reach out to the patient again over the next 30 to 60 days.   Noreene Larsson RN, MSN, [facility] Mobile: [phone]

## 2020-04-02 NOTE — Patient Instructions (Signed)
Visit Information  Goals Addressed            This Visit's Progress   . RNCM: Pt-" I have not taken my blood sugars in 2 weeks" (pt-stated)       CARE PLAN ENTRY (see longitudinal plan of care for additional care plan information)  Current Barriers:  Marland Kitchen Knowledge Deficits related to the importance of managing diabetes as evidence of hemoglobin A1C of 14 in November 2020 . 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 Difficulty obtaining medications . Needs a new glucose meter to check blood sugars . Lacks motivation   Nurse Case Manager Clinical Goal(s):  Marland Kitchen Over the next 120 days, patient will verbalize understanding of plan for diabetes management  . Over the next 120 days, patient will work with RNCM, pcp and pharmacist to address needs related to effective diabetes management  . Over the next 120 days, patient will demonstrate a decrease in high blood sugars exacerbations as evidenced by monitoring blood sugars daily, adhering  to dietary restrictions, and following plan of care as recommended by the provider . Over the next 120 days, patient will attend all scheduled medical appointments: appointment with pcp on 04-12-2020 . Over the next 120 days, patient will demonstrate improved adherence to prescribed treatment plan for diabetes as evidenced bychecking blood sugars daily, decrease in hemoglobin A1C, compliance with medications regimen and following the plan of care established by provider . Over the next 120 days, patient will verbalize basic understanding of diabetes disease process and self health management plan as evidenced by compliance with plan of care and decrease in blood sugar levels . Over the next 120 days, patient will demonstrate understanding of rationale for each prescribed medication as evidenced by compliance and ability to obtain medications without cost constraints  . Over the next 120 days, patient will  work with CM team pharmacist to get needed help with cost of prescriptions, obtaining needed refills and being more compliant with medication regimen.   Interventions:  . Inter-disciplinary care team collaboration (see longitudinal plan of care) . Evaluation of current treatment plan related to diabetes management and patient's adherence to plan as established by provider. . Advised patient to check with pharmacy concerning needed refills. The patient states that he was there yesterday and the still do not have scripts for refills. The patient also needs a new glucose meter that his insurance provider will pay for strips and kit. Will send inbasket message to pharmacist and pcp . Provided education to patient re: on putting am pills he takes in a plastic bag and have with his clothes so he will not forget to take or set an alarm on his phone to remind him to take. The patient admits when he is working he leaves and forgets to take his medications . Reviewed medications with patient and discussed compliance and ways to remember to take medications.  Nash Dimmer with CCM team pharmacist and pcp regarding the need for new glucose machine ( would like one where the insurance will pay for kit and strips), the need for medication scripts, the patient not taking blood glucose levels regularly and not always following dietary restrictions. . Discussed plans with patient for ongoing care management follow up and provided patient with direct contact information for care management team . Reviewed scheduled/upcoming provider appointments including: pcp appointment on 4-26 . Advised patient, providing education and rationale, to check cbg at least one time daily and  record, calling pcp for findings outside established parameters.   . Education on the benefits of a heart healthy/ADA diet in controlling blood sugar levels. The patient eats out a lot and admits he eats more sweets than he should at times.    . Education on the negative impact of uncontrolled diabetes on other body systems.  The patient verbalized he needs to do better at taking care of his diabetes.  . Pharmacy referral for help with medication cost, compliance and scripts.   Patient Self Care Activities:  . Patient verbalizes understanding of plan to work with CCM team to help patient manage diabetes more effectively . Self administers medications as prescribed . Attends all scheduled provider appointments . Calls pharmacy for medication refills . Calls provider office for new concerns or questions . Unable to independently manage diabetes as evidence of hemoglobin A1C of 14 in November 2020.  Marland Kitchen Unable to self administer medications as prescribed . Does not adhere to provider recommendations re:  Marland Kitchen Does not adhere to prescribed medication regimen . Does not contact provider office for questions/concerns  Initial goal documentation     . RNCM: Pt-"I have a lot of health issues" (pt-stated)       CARE PLAN ENTRY (see longtitudinal plan of care for additional care plan information)  Current Barriers:  . Chronic Disease Management support, education, and care coordination needs related to Atrial Fibrillation, HTN, HLD, COPD, and RLS . Does not have blood pressure cuff to check blood sugars . No AD/LW . Needs scripts for gabapentin, albuterol inhaler, and cream for athlete's feet  Clinical Goal(s) related to Atrial Fibrillation, HTN, HLD, COPD, and RLS :  Over the next 120 days, patient will:  . Work with the care management team to address educational, disease management, and care coordination needs  . Begin or continue self health monitoring activities as directed today Measure and record blood pressure 2 times per week and adhere to a heart healthy/ADA diet . Call provider office for new or worsened signs and symptoms Blood pressure findings outside established parameters, Oxygen saturation lower than established  parameter, Chest pain, Shortness of breath, and New or worsened symptom related to HLD, RLS and other chronic conditions . Call care management team with questions or concerns . Verbalize basic understanding of patient centered plan of care established today  Interventions related to Atrial Fibrillation, HTN, HLD, COPD, and RLS :  . Evaluation of current treatment plans and patient's adherence to plan as established by provider . Assessed patient understanding of disease states . Assessed patient's education and care coordination needs.  The patient needs scripts for his inhaler, gabapentin, athlete's feet and new glucose meter. Also having difficulty with medication cost.  . Assessed concern of RLS being a big issue for him right now. He says the pharmacist has not received the script for gabapentin. In basket message to the pharmacist and pcp.  . Provided disease specific education to patient: Education on heart healthy/ADA diet and benefits. The patient eats out a lot.   . Provided education on checking blood pressures at home. The patient does not have a blood pressure cuff. Will work on ways to get a blood pressure cuff for the patient. Plan to give patient a log book at 04-12-2020 appointment for writing down his blood pressures and blood glucose readings.  . Education on AD/LW- the patient agrees to receive a packet to review with his daughter at his visit on 04-12-2020.  Marland Kitchen Collaborated with appropriate  clinical care team members regarding patient needs.  The patient has worked with the pharmacist in the past. Re referral for help with medication cost, obtaining refills, and ways to get the patient to take medication as prescribed.   Patient Self Care Activities related to Atrial Fibrillation, HTN, HLD, COPD, and RLS :  . Patient is unable to independently self-manage chronic health conditions  Initial goal documentation        Mr. Clausen was given information about Chronic Care  Management services today including:  1. CCM service includes personalized support from designated clinical staff supervised by his physician, including individualized plan of care and coordination with other care providers 2. 24/7 contact phone numbers for assistance for urgent and routine care needs. 3. Service will only be billed when office clinical staff spend 20 minutes or more in a month to coordinate care. 4. Only one practitioner may furnish and bill the service in a calendar month. 5. The patient may stop CCM services at any time (effective at the end of the month) by phone call to the office staff. 6. The patient will be responsible for cost sharing (co-pay) of up to 20% of the service fee (after annual deductible is met).  Patient agreed to services and verbal consent obtained.   Patient verbalizes understanding of instructions provided today.   The care management team will reach out to the patient again over the next 30 to 60 days.   Noreene Larsson RN, MSN, [facility] Mobile: [phone]

## 2020-04-06 ENCOUNTER — Telehealth: Payer: Self-pay | Admitting: Family Medicine

## 2020-04-06 ENCOUNTER — Ambulatory Visit: Payer: Self-pay | Admitting: Pharmacist

## 2020-04-06 NOTE — Telephone Encounter (Signed)
Called pharmacy to verify if patient has refills on his gabapentin and they said yes they did and will get them ready for patient. Patient notified

## 2020-04-06 NOTE — Chronic Care Management (AMB) (Signed)
  Chronic Care Management   Note  04/06/2020 Name: Ernest [name] MRN: [id number] DOB: 01-19-1951  Ernest [name] is a 69 y.o. year old male who is a primary care patient of Crissman, Jeannette How, MD. The CCM team was consulted for assistance with chronic disease management and care coordination needs.    Patient has not come by clinic to sign his portions of patient assistance applications as previously agreed upon. Placed the forms in the mail for him today. Scheduled f/u call 05/07/20.  Catie Darnelle Maffucci, PharmD, [place] [phone]

## 2020-04-06 NOTE — Telephone Encounter (Signed)
Pt states he needs a refill of Gabapentin.

## 2020-04-06 NOTE — Telephone Encounter (Signed)
Routing to provider  

## 2020-04-06 NOTE — Telephone Encounter (Signed)
Seems like pt was taking 2 tab every night following the directions so he run out of them in 3 months. Am I correct?  Sig - Route: Take 2 tablets (1,200 mg total) by mouth at bedtime. - Oral

## 2020-04-06 NOTE — Telephone Encounter (Signed)
6 month supply called in in January. Not due until June.

## 2020-04-06 NOTE — Telephone Encounter (Signed)
Given 180 pills with a refill. In January. Should not be out

## 2020-04-12 ENCOUNTER — Encounter: Payer: Self-pay | Admitting: Family Medicine

## 2020-04-12 ENCOUNTER — Ambulatory Visit (INDEPENDENT_AMBULATORY_CARE_PROVIDER_SITE_OTHER): Payer: Medicare HMO | Admitting: Family Medicine

## 2020-04-12 ENCOUNTER — Other Ambulatory Visit: Payer: Self-pay

## 2020-04-12 ENCOUNTER — Other Ambulatory Visit: Payer: Self-pay | Admitting: Family Medicine

## 2020-04-12 VITALS — BP 132/74 | HR 74 | Temp 98.1°F | Ht 68.7 in | Wt 235.8 lb

## 2020-04-12 DIAGNOSIS — I129 Hypertensive chronic kidney disease with stage 1 through stage 4 chronic kidney disease, or unspecified chronic kidney disease: Secondary | ICD-10-CM | POA: Diagnosis not present

## 2020-04-12 DIAGNOSIS — I4821 Permanent atrial fibrillation: Secondary | ICD-10-CM

## 2020-04-12 DIAGNOSIS — J449 Chronic obstructive pulmonary disease, unspecified: Secondary | ICD-10-CM | POA: Diagnosis not present

## 2020-04-12 DIAGNOSIS — J439 Emphysema, unspecified: Secondary | ICD-10-CM | POA: Diagnosis not present

## 2020-04-12 DIAGNOSIS — K219 Gastro-esophageal reflux disease without esophagitis: Secondary | ICD-10-CM | POA: Diagnosis not present

## 2020-04-12 DIAGNOSIS — E1122 Type 2 diabetes mellitus with diabetic chronic kidney disease: Secondary | ICD-10-CM

## 2020-04-12 DIAGNOSIS — I10 Essential (primary) hypertension: Secondary | ICD-10-CM

## 2020-04-12 DIAGNOSIS — I4891 Unspecified atrial fibrillation: Secondary | ICD-10-CM | POA: Diagnosis not present

## 2020-04-12 DIAGNOSIS — B353 Tinea pedis: Secondary | ICD-10-CM | POA: Diagnosis not present

## 2020-04-12 DIAGNOSIS — I482 Chronic atrial fibrillation, unspecified: Secondary | ICD-10-CM | POA: Diagnosis not present

## 2020-04-12 DIAGNOSIS — I25111 Atherosclerotic heart disease of native coronary artery with angina pectoris with documented spasm: Secondary | ICD-10-CM | POA: Diagnosis not present

## 2020-04-12 DIAGNOSIS — E782 Mixed hyperlipidemia: Secondary | ICD-10-CM

## 2020-04-12 DIAGNOSIS — E669 Obesity, unspecified: Secondary | ICD-10-CM | POA: Insufficient documentation

## 2020-04-12 DIAGNOSIS — N183 Chronic kidney disease, stage 3 unspecified: Secondary | ICD-10-CM

## 2020-04-12 DIAGNOSIS — Z6835 Body mass index (BMI) 35.0-35.9, adult: Secondary | ICD-10-CM

## 2020-04-12 LAB — MICROALBUMIN, URINE WAIVED
Creatinine, Urine Waived: 100 mg/dL (ref 10–300)
Microalb, Ur Waived: 30 mg/L — ABNORMAL HIGH (ref 0–19)

## 2020-04-12 MED ORDER — ALBUTEROL SULFATE HFA 108 (90 BASE) MCG/ACT IN AERS: 2.0000 | INHALATION_SPRAY | RESPIRATORY_TRACT | 6 refills | Status: DC | PRN

## 2020-04-12 MED ORDER — CLOTRIMAZOLE-BETAMETHASONE 1-0.05 % EX CREA: 1.0000 "application " | TOPICAL_CREAM | Freq: Two times a day (BID) | CUTANEOUS | 2 refills | Status: DC

## 2020-04-12 MED ORDER — ATORVASTATIN CALCIUM 80 MG PO TABS: 80.0000 mg | ORAL_TABLET | Freq: Every day | ORAL | 0 refills | Status: DC

## 2020-04-12 MED ORDER — RIVAROXABAN 20 MG PO TABS: ORAL_TABLET | ORAL | 0 refills | Status: DC

## 2020-04-12 NOTE — Assessment & Plan Note (Signed)
Under good control on current regimen. Continue current regimen. Continue to monitor. Call with any concerns. Refills given. Labs drawn today.   

## 2020-04-12 NOTE — Assessment & Plan Note (Signed)
A1c not correlating with readings at home. Will send A1c out for confirmation and get him new testing supplies. Await results to discuss changing medicine.

## 2020-04-12 NOTE — Assessment & Plan Note (Signed)
Congratulated patient on his weight loss. Continue to work on diet and exercise with goal of losing 1-2lbs per week.

## 2020-04-12 NOTE — Assessment & Plan Note (Signed)
Continue to follow with cardiology. Call with any concerns. Will keep BP and cholesterol and sugars under good control.

## 2020-04-12 NOTE — Progress Notes (Signed)
BP 132/74 (BP Location: Left Arm, Patient Position: Sitting, Cuff Size: Normal)   Pulse 74   Temp 98.1 F (36.7 C) (Oral)   Ht 5' 8.7" (1.745 m)   Wt 235 lb 12.8 oz (107 kg)   SpO2 98%   BMI 35.13 kg/m    Subjective:    Patient ID: Ernest [name], male    DOB: 1951-04-04, 69 y.o.   MRN: [id number]  HPI: Absalom Aro is a 69 y.o. male  Chief Complaint  Patient presents with  . Diabetes  . Medication Refill    albuterol (PROVENTIL HFA;VENTOLIN HFA) 108 (90 Base) MCG/ACT inhaler   RASH Duration:  2 years off and on   Location: L foot  Itching: yes Burning: yes Redness: yes Oozing: no Scaling: yes Blisters: yes Painful: no Fevers: no Change in detergents/soaps/personal care products: no Recent illness: no Recent travel:no History of same: yes Context: better Alleviating factors: tinactin Treatments attempted:OTC anit-fungal Shortness of breath: no  Throat/tongue swelling: no Myalgias/arthralgias: no  DIABETES- forgets his medicine often Hypoglycemic episodes:no Polydipsia/polyuria: yes Visual disturbance: no Chest pain: no Paresthesias: no Glucose Monitoring: yes  Accucheck frequency: occasionally  Fasting glucose: 290s-High Taking Insulin?: no Blood Pressure Monitoring: not checking Retinal Examination: Up to Date Foot Exam: Done today Diabetic Education: Completed Pneumovax: Up to Date Influenza: Up to Date Aspirin: no  HYPERTENSION Hypertension status: controlled  Satisfied with current treatment? yes Duration of hypertension: chronic BP monitoring frequency:  not checking BP medication side effects:  no Medication compliance: excellent compliance Previous BP meds: Aspirin: no Recurrent headaches: no Visual changes: no Palpitations: yes Dyspnea: no Chest pain: no Lower extremity edema: no Dizzy/lightheaded: yes  GERD GERD control status: stableSatisfied with current treatment? yes Heartburn frequency:  Medication side  effects: no  Medication compliance: stable Dysphagia: no Odynophagia:  no Hematemesis: no Blood in stool: no EGD: no   Relevant past medical, surgical, family and social history reviewed and updated as indicated. Interim medical history since our last visit reviewed. Allergies and medications reviewed and updated.  Review of Systems  Per HPI unless specifically indicated above     Objective:    BP 132/74 (BP Location: Left Arm, Patient Position: Sitting, Cuff Size: Normal)   Pulse 74   Temp 98.1 F (36.7 C) (Oral)   Ht 5' 8.7" (1.745 m)   Wt 235 lb 12.8 oz (107 kg)   SpO2 98%   BMI 35.13 kg/m   Wt Readings from Last 3 Encounters:  04/12/20 235 lb 12.8 oz (107 kg)  03/08/19 242 lb 12.8 oz (110.1 kg)  02/04/19 268 lb 4.8 oz (121.7 kg)    Physical Exam Vitals and nursing note reviewed.  Constitutional:      General: He is not in acute distress.    Appearance: Normal appearance. He is not ill-appearing, toxic-appearing or diaphoretic.  HENT:     Head: Normocephalic and atraumatic.     Right Ear: External ear normal.     Left Ear: External ear normal.     Nose: Nose normal.     Mouth/Throat:     Mouth: Mucous membranes are moist.     Pharynx: Oropharynx is clear.  Eyes:     General: No scleral icterus.       Right eye: No discharge.        Left eye: No discharge.     Extraocular Movements: Extraocular movements intact.     Conjunctiva/sclera: Conjunctivae normal.     Pupils: Pupils  are equal, round, and reactive to light.  Cardiovascular:     Rate and Rhythm: Normal rate and regular rhythm.     Pulses: Normal pulses.     Heart sounds: Normal heart sounds. No murmur. No friction rub. No gallop.   Pulmonary:     Effort: Pulmonary effort is normal. No respiratory distress.     Breath sounds: Normal breath sounds. No stridor. No wheezing, rhonchi or rales.  Chest:     Chest wall: No tenderness.  Musculoskeletal:        General: Normal range of motion.      Cervical back: Normal range of motion and neck supple.  Skin:    General: Skin is warm and dry.     Capillary Refill: Capillary refill takes less than 2 seconds.     Coloration: Skin is not jaundiced or pale.     Findings: No bruising, erythema, lesion or rash.     Comments: scaley rash on bottom of L foot  Neurological:     General: No focal deficit present.     Mental Status: He is alert and oriented to person, place, and time. Mental status is at baseline.  Psychiatric:        Mood and Affect: Mood normal.        Behavior: Behavior normal.        Thought Content: Thought content normal.        Judgment: Judgment normal.     Results for orders placed or performed in visit on 11/21/19  Comp Met (CMET)  Result Value Ref Range   Glucose 158 (H) 65 - 99 mg/dL   BUN 16 8 - 27 mg/dL   Creatinine, Ser 1.01 0.76 - 1.27 mg/dL   GFR calc non Af Amer 76 >59 mL/min/1.73   GFR calc Af Amer 87 >59 mL/min/1.73   BUN/Creatinine Ratio 16 10 - 24   Sodium 144 134 - 144 mmol/L   Potassium 4.0 3.5 - 5.2 mmol/L   Chloride 105 96 - 106 mmol/L   CO2 22 20 - 29 mmol/L   Calcium 9.8 8.6 - 10.2 mg/dL   Total Protein 6.7 6.0 - 8.5 g/dL   Albumin 4.8 3.8 - 4.8 g/dL   Globulin, Total 1.9 1.5 - 4.5 g/dL   Albumin/Globulin Ratio 2.5 (H) 1.2 - 2.2   Bilirubin Total 1.2 0.0 - 1.2 mg/dL   Alkaline Phosphatase 84 39 - 117 IU/L   AST 22 0 - 40 IU/L   ALT 20 0 - 44 IU/L      Assessment & Plan:   Problem List Items Addressed This Visit      Cardiovascular and Mediastinum   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 Call with any concerns. Will keep BP and cholesterol and sugars under good control.       Relevant Medications   atorvastatin (LIPITOR) 80 MG tablet   rivaroxaban (XARELTO) 20 MG TABS tablet   Atrial fibrillation (HCC)    Continue to follow with cardiology. HR good. On xarelto.         Relevant Medications   atorvastatin (LIPITOR) 80 MG tablet   rivaroxaban (XARELTO) 20 MG TABS tablet   Other Relevant Orders   CBC with Differential/Platelet   Comprehensive metabolic panel   Essential hypertension    Under good control on current regimen. Continue current regimen. Continue to monitor. Call with any concerns.  Refills given. Labs drawn today.       Relevant Medications   atorvastatin (LIPITOR) 80 MG tablet   rivaroxaban (XARELTO) 20 MG TABS tablet   Other Relevant Orders   Comprehensive metabolic panel   Microalbumin, Urine Waived   Type 2 DM with CKD stage 3 and hypertension ([place]) - Primary    A1c not correlating with readings at home. Will send A1c out for confirmation and get him new testing supplies. Await results to discuss changing medicine.       Relevant Medications   atorvastatin (LIPITOR) 80 MG tablet   rivaroxaban (XARELTO) 20 MG TABS tablet   Other Relevant Orders   Bayer DCA Hb A1c Waived   Comprehensive metabolic panel   Lipid Panel w/o Chol/HDL Ratio   Microalbumin, Urine Waived   Hgb A1c w/o eAG     Respiratory   COPD (chronic obstructive pulmonary disease) with emphysema (HCC)    Under good control on current regimen. Continue current regimen. Continue to monitor. Call with any concerns. Refills given.        Relevant Medications   albuterol (VENTOLIN HFA) 108 (90 Base) MCG/ACT inhaler     Digestive   GERD (gastroesophageal reflux disease)    Under good control on current regimen. Continue current regimen. Continue to monitor. Call with any concerns. Refills given. Labs drawn today.       Relevant Orders   CBC with Differential/Platelet   Comprehensive metabolic panel     Other   Hyperlipidemia    Under good control on current regimen. Continue current regimen. Continue to monitor. Call with any concerns. Refills given. Labs drawn today.       Relevant Medications   atorvastatin (LIPITOR) 80 MG tablet   rivaroxaban  (XARELTO) 20 MG TABS tablet   BMI 35.0-35.9,adult    Congratulated patient on his weight loss. Continue to work on diet and exercise with goal of losing 1-2lbs per week.      Morbid (severe) obesity due to excess calories (HCC)    Due to DM, HTN, HLD. Congratulated patient on his weight loss. Continue diet and exercise with goal of losing 1-2lbs per week.        Other Visit Diagnoses    Tinea pedis of left foot       Will treat with lortisone. Call with any concerns.    Relevant Medications   clotrimazole-betamethasone (LOTRISONE) cream       Follow up plan: Return in about 3 months (around 07/12/2020).

## 2020-04-12 NOTE — Assessment & Plan Note (Signed)
Due to DM, HTN, HLD. Congratulated patient on his weight loss. Continue diet and exercise with goal of losing 1-2lbs per week.

## 2020-04-12 NOTE — Assessment & Plan Note (Signed)
Under good control on current regimen. Continue current regimen. Continue to monitor. Call with any concerns. Refills given.   

## 2020-04-12 NOTE — Assessment & Plan Note (Signed)
Continue to follow with cardiology. HR good. On xarelto.

## 2020-04-13 LAB — CBC WITH DIFFERENTIAL/PLATELET
Basophils Absolute: 0 10*3/uL (ref 0.0–0.2)
Basos: 1 %
EOS (ABSOLUTE): 0.2 10*3/uL (ref 0.0–0.4)
Eos: 3 %
Hematocrit: 43.6 % (ref 37.5–51.0)
Hemoglobin: 14.3 g/dL (ref 13.0–17.7)
Immature Grans (Abs): 0 10*3/uL (ref 0.0–0.1)
Immature Granulocytes: 0 %
Lymphocytes Absolute: 2.5 10*3/uL (ref 0.7–3.1)
Lymphs: 40 %
MCH: 28.1 pg (ref 26.6–33.0)
MCHC: 32.8 g/dL (ref 31.5–35.7)
MCV: 86 fL (ref 79–97)
Monocytes Absolute: 0.3 10*3/uL (ref 0.1–0.9)
Monocytes: 5 %
Neutrophils Absolute: 3.3 10*3/uL (ref 1.4–7.0)
Neutrophils: 51 %
Platelets: 168 10*3/uL (ref 150–450)
RBC: 5.09 x10E6/uL (ref 4.14–5.80)
RDW: 14.8 % (ref 11.6–15.4)
WBC: 6.4 10*3/uL (ref 3.4–10.8)

## 2020-04-13 LAB — COMPREHENSIVE METABOLIC PANEL
ALT: 14 IU/L (ref 0–44)
AST: 15 IU/L (ref 0–40)
Albumin/Globulin Ratio: 2.2 (ref 1.2–2.2)
Albumin: 4.4 g/dL (ref 3.8–4.8)
Alkaline Phosphatase: 75 IU/L (ref 39–117)
BUN/Creatinine Ratio: 21 (ref 10–24)
BUN: 20 mg/dL (ref 8–27)
Bilirubin Total: 0.8 mg/dL (ref 0.0–1.2)
CO2: 24 mmol/L (ref 20–29)
Calcium: 9.8 mg/dL (ref 8.6–10.2)
Chloride: 102 mmol/L (ref 96–106)
Creatinine, Ser: 0.97 mg/dL (ref 0.76–1.27)
GFR calc Af Amer: 92 mL/min/{1.73_m2} (ref >59)
GFR calc non Af Amer: 79 mL/min/{1.73_m2} (ref >59)
Globulin, Total: 2 g/dL (ref 1.5–4.5)
Glucose: 102 mg/dL — ABNORMAL HIGH (ref 65–99)
Potassium: 4.5 mmol/L (ref 3.5–5.2)
Sodium: 144 mmol/L (ref 134–144)
Total Protein: 6.4 g/dL (ref 6.0–8.5)

## 2020-04-13 LAB — LIPID PANEL W/O CHOL/HDL RATIO
Cholesterol, Total: 90 mg/dL — ABNORMAL LOW (ref 100–199)
HDL: 31 mg/dL — ABNORMAL LOW (ref >39)
LDL Chol Calc (NIH): 34 mg/dL (ref 0–99)
Triglycerides: 142 mg/dL (ref 0–149)
VLDL Cholesterol Cal: 25 mg/dL (ref 5–40)

## 2020-04-13 LAB — HGB A1C W/O EAG: Hgb A1c MFr Bld: 5.8 % — ABNORMAL HIGH (ref 4.8–5.6)

## 2020-04-20 ENCOUNTER — Encounter: Payer: Self-pay | Admitting: Family Medicine

## 2020-05-01 ENCOUNTER — Other Ambulatory Visit: Payer: Self-pay | Admitting: Cardiovascular Disease

## 2020-05-03 ENCOUNTER — Other Ambulatory Visit: Payer: Self-pay | Admitting: Cardiovascular Disease

## 2020-05-07 ENCOUNTER — Ambulatory Visit (INDEPENDENT_AMBULATORY_CARE_PROVIDER_SITE_OTHER): Payer: Medicare HMO | Admitting: Pharmacist

## 2020-05-07 DIAGNOSIS — E1122 Type 2 diabetes mellitus with diabetic chronic kidney disease: Secondary | ICD-10-CM | POA: Diagnosis not present

## 2020-05-07 DIAGNOSIS — N183 Chronic kidney disease, stage 3 unspecified: Secondary | ICD-10-CM

## 2020-05-07 DIAGNOSIS — I4821 Permanent atrial fibrillation: Secondary | ICD-10-CM

## 2020-05-07 DIAGNOSIS — I10 Essential (primary) hypertension: Secondary | ICD-10-CM

## 2020-05-07 DIAGNOSIS — I129 Hypertensive chronic kidney disease with stage 1 through stage 4 chronic kidney disease, or unspecified chronic kidney disease: Secondary | ICD-10-CM | POA: Diagnosis not present

## 2020-05-07 NOTE — Patient Instructions (Addendum)
[name],   Keep up the great work!  Let's try stopping the glipizide. To evaluate the impact of this, here is what I need you to do:   1) Keep checking fasting blood sugars. We want these to remain less than 130  2) Start checking blood sugars about 2 hours after supper. We want these to be less than 180.   I've included a log where you can jot down the date and what your sugar was at the time. There is also a spot to record blood pressures. I recommend you call [employer] and ask if they have "Over the Counter Benefits"; you may be able to receive a blood pressure machine from [facility] through that. If you don't have this service, or you don't want to use your benefits on a blood pressure machine, I do recommend you buy one from a local store.   Regarding the cost of your medications - check how much you have spent in total on your copays in 2021. This will helps [place] see if you would qualify to get the Xarelto and Invokana for free from that drug company.   Please call me with any questions!  Catie Darnelle Maffucci, PharmD [phone]  Visit Information  Goals Addressed            This Visit's Progress     Patient Stated   . PharmD "My medications are expensive" (pt-stated)       CARE PLAN ENTRY (see longtitudinal plan of care for additional care plan information)  Current Barriers:  . Financial barriers - previously noted concerns affording Xarelto, Invokana, Symbicort, but never returned patient assistance application information . Diabetes: controlled; complicated by chronic medical conditions including hx Aib, CAD, COPD, most recent A1c 5.8% o Reports he has lost ~50 lbs by adjusting eating habits . Most recent eGFR: ~92 mL/min . Current antihyperglycemic regimen: metformin XR 1000 mg BID, glipizide 5 mg w/ supper, Invokana 300 mg QAM . Does report one episode of shaking in the evening hat he thought was hypoglycemia, but he did not check his sugars to confirm  . Current blood  glucose readings:  o Fasting: 99-130s o Not checking any other times of the day . Cardiovascular risk reduction: o Current hypertensive regimen: furosemide 20 mg daily, diltiazem 180 mg daily, metoprolol succinate 25 mg daily, ramipril 10 mg daily, isosorbide 60 mg daily o Current hyperlipidemia regimen: atorvastatin 80 mg daily; LDL well controlled at goal <70 o Current antiplatelet regimen: clopidogrel 75 mg daily + Xarelto 20 mg daily  . COPD: previous followed w/ pulmonary. Last spirometry in 2015 showed normal spirometry, Dr. Zoila Shutter note in 2019 indicated some components of interstitial lung disease w/ underlying COPD.   Pharmacist Clinical Goal(s):  Marland Kitchen Over the next 90 days, patient will work with PharmD and primary care provider to address optimized medication management  Interventions: . Comprehensive medication review performed, medication list updated in electronic medical record . Inter-disciplinary care team collaboration (see longitudinal plan of care) . Praised patient for obtaining control of A1c.  . Reviewed goal A1c, goal fasting, and goal 2 hour post prandial glucose. Encouraged to continue to check fasting readings, but add occasional post supper checks. Providing w/ log to document to provide at future review.  . Discussed hypoglycemia after supper. Recommend hold glipizide, and monitor sugars after. Patient verbalizes understanding. Encouraged to review post-supper readings w/ RN CM at next phone call to evaluate impact of holding glipizide.  . Discussed benefits of home BP  monitoring. Reviewed to call [facility] and ask about Over the Counter benefits to purchase a BP machine, recommend arm over wrist. He verbalized understanding . Will have patient review income and out of pocket spend to determine if we can obtain expensive medications through patient assistance.   Patient Self Care Activities:  . Patient will check blood glucose daily, document, and provide at future  appointments . Patient will take medications as prescribed . Patient will report any questions or concerns to provider   Please see past updates related to this goal by clicking on the "Past Updates" button in the selected goal         The patient verbalized understanding of instructions provided today and agreed to receive a mailed copy of patient instruction and/or educational materials.  Plan:  - Scheduled f/u call in ~ 12 weeks  Catie Darnelle Maffucci, PharmD, [place] [phone]

## 2020-05-07 NOTE — Chronic Care Management (AMB) (Signed)
Chronic Care Management   Follow Up Note   05/07/2020 Name: Ernest [name] MRN: [id number] DOB: 1951-04-23  Referred by: Ernest Maple, MD Reason for referral : Chronic Care Management (Medication Management )   Ernest [name] is a 69 y.o. year old male who is a primary care patient of [name], Ernest How, MD. The CCM team was consulted for assistance with chronic disease management and care coordination needs.    Contacted patient for medication management review.   Review of patient status, including review of consultants reports, relevant laboratory and other test results, and collaboration with appropriate care team members and the patient's provider was performed as part of comprehensive patient evaluation and provision of chronic care management services.    SDOH (Social Determinants of Health) assessments performed: Yes See Care Plan activities for detailed interventions related to [facility])     Outpatient Encounter Medications as of 05/07/2020  Medication Sig Note  . atorvastatin (LIPITOR) 80 MG tablet Take 1 tablet (80 mg total) by mouth daily.   . canagliflozin (INVOKANA) 300 MG TABS tablet Take 1 tablet (300 mg total) by mouth daily before breakfast.   . clopidogrel (PLAVIX) 75 MG tablet TAKE 1 TABLET(75 MG) BY MOUTH DAILY WITH BREAKFAST   . diltiazem (CARDIZEM CD) 180 MG 24 hr capsule Take 1 capsule (180 mg total) by mouth daily.   . furosemide (LASIX) 40 MG tablet Take 0.5 tablets (20 mg total) by mouth 2 (two) times daily.   Marland Kitchen gabapentin (NEURONTIN) 600 MG tablet Take 2 tablets (1,200 mg total) by mouth at bedtime.   Marland Kitchen glipiZIDE (GLUCOTROL) 5 MG tablet TAKE 1 TABLET BY MOUTH  DAILY BEFORE BREAKFAST 05/07/2020: Taking QPM  . isosorbide mononitrate (IMDUR) 60 MG 24 hr tablet TAKE 1 TABLET(60 MG) BY MOUTH DAILY   . metFORMIN (GLUCOPHAGE-XR) 500 MG 24 hr tablet Take 2 tablets (1,000 mg total) by mouth 2 (two) times daily.   . metoprolol succinate (TOPROL-XL) 25 MG 24 hr  tablet TAKE 1 TABLET(25 MG) BY MOUTH DAILY   . ramipril (ALTACE) 10 MG capsule TAKE 1 CAPSULE(10 MG) BY MOUTH DAILY   . rivaroxaban (XARELTO) 20 MG TABS tablet TAKE 1 TABLET(20 MG) BY MOUTH DAILY WITH SUPPER   . albuterol (VENTOLIN HFA) 108 (90 Base) MCG/ACT inhaler Inhale 2 puffs into the lungs every 4 (four) hours as needed for wheezing or shortness of breath.   . budesonide-formoterol (SYMBICORT) 160-4.5 MCG/ACT inhaler Inhale 2 puffs into the lungs 2 (two) times daily. 02/24/2020: PRN   . clotrimazole-betamethasone (LOTRISONE) cream Apply 1 application topically 2 (two) times daily.   . pantoprazole (PROTONIX) 40 MG tablet Take 1 tablet (40 mg total) by mouth daily. 02/24/2020: Omeprazole OTC   No facility-administered encounter medications on file as of 05/07/2020.     Objective:   Goals Addressed            This Visit's Progress     Patient Stated   . PharmD "My medications are expensive" (pt-stated)       CARE PLAN ENTRY (see longtitudinal plan of care for additional care plan information)  Current Barriers:  . Financial barriers - previously noted concerns affording Xarelto, Invokana, Symbicort, but never returned patient assistance application information . Diabetes: controlled; complicated by chronic medical conditions including hx Aib, CAD, COPD, most recent A1c 5.8% o Reports he has lost ~50 lbs by adjusting eating habits . Most recent eGFR: ~92 mL/min . Current antihyperglycemic regimen: metformin XR 1000 mg BID, glipizide  5 mg w/ supper, Invokana 300 mg QAM . Does report one episode of shaking in the evening hat he thought was hypoglycemia, but he did not check his sugars to confirm  . Current blood glucose readings:  o Fasting: 99-130s o Not checking any other times of the day . Cardiovascular risk reduction: o Current hypertensive regimen: furosemide 20 mg daily, diltiazem 180 mg daily, metoprolol succinate 25 mg daily, ramipril 10 mg daily, isosorbide 60 mg  daily o Current hyperlipidemia regimen: atorvastatin 80 mg daily; LDL well controlled at goal <70 o Current antiplatelet regimen: clopidogrel 75 mg daily + Xarelto 20 mg daily  . COPD: previous followed w/ pulmonary. Last spirometry in 2015 showed normal spirometry, Dr. Zoila [name] note in 2019 indicated some components of interstitial lung disease w/ underlying COPD.   Pharmacist Clinical Goal(s):  Marland Kitchen Over the next 90 days, patient will work with PharmD and primary care provider to address optimized medication management  Interventions: . Comprehensive medication review performed, medication list updated in electronic medical record . Inter-disciplinary care team collaboration (see longitudinal plan of care) . Praised patient for obtaining control of A1c.  . Reviewed goal A1c, goal fasting, and goal 2 hour post prandial glucose. Encouraged to continue to check fasting readings, but add occasional post supper checks. Providing w/ log to document to provide at future review.  . Discussed hypoglycemia after supper. Recommend hold glipizide, and monitor sugars after. Patient verbalizes understanding. Encouraged to review post-supper readings w/ RN CM at next phone call to evaluate impact of holding glipizide.  . Discussed benefits of home BP monitoring. Reviewed to call [facility] and ask about Over the Counter benefits to purchase a BP machine, recommend arm over wrist. He verbalized understanding  Patient Self Care Activities:  . Patient will check blood glucose daily, document, and provide at future appointments . Patient will take medications as prescribed . Patient will report any questions or concerns to provider   Please see past updates related to this goal by clicking on the "Past Updates" button in the selected goal          Plan:  - Scheduled f/u call in ~ 12 weeks  Ernest [name], PharmD, [place] [phone]

## 2020-05-22 ENCOUNTER — Other Ambulatory Visit: Payer: Self-pay | Admitting: Family Medicine

## 2020-05-22 DIAGNOSIS — I482 Chronic atrial fibrillation, unspecified: Secondary | ICD-10-CM

## 2020-05-22 NOTE — Telephone Encounter (Signed)
Requested Prescriptions  Pending Prescriptions Disp Refills  . atorvastatin (LIPITOR) 80 MG tablet [Pharmacy Med Name: ATORVASTATIN 80MG  TABLETS] 90 tablet 0    Sig: TAKE 1 TABLET(80 MG) BY MOUTH DAILY     Cardiovascular:  Antilipid - Statins Failed - 05/22/2020  9:40 AM      Failed - Total Cholesterol in normal range and within 360 days    Cholesterol, Total  Date Value Ref Range Status  04/12/2020 90 (L) 100 - 199 mg/dL Final   Cholesterol  Date Value Ref Range Status  07/25/2014 104 0 - 200 mg/dL Final   Cholesterol Piccolo, Waived  Date Value Ref Range Status  08/01/2017 195 <200 mg/dL Final    Comment:                            Desirable                <200                         Borderline High      200- 239                         High                     >239          Failed - LDL in normal range and within 360 days    Ldl Cholesterol, Calc  Date Value Ref Range Status  07/25/2014 30 0 - 100 mg/dL Final   LDL Chol Calc (NIH)  Date Value Ref Range Status  04/12/2020 34 0 - 99 mg/dL Final         Failed - HDL in normal range and within 360 days    HDL Cholesterol  Date Value Ref Range Status  07/25/2014 25 (L) 40 - 60 mg/dL Final   HDL  Date Value Ref Range Status  04/12/2020 31 (L) >39 mg/dL Final         Passed - Triglycerides in normal range and within 360 days    Triglycerides  Date Value Ref Range Status  04/12/2020 142 0 - 149 mg/dL Final  07/25/2014 243 (H) 0 - 200 mg/dL Final   Triglyceride fasting, serum  Date Value Ref Range Status  05/27/2010 249 mg/dL    Triglycerides Piccolo,Waived  Date Value Ref Range Status  08/01/2017 268 (H) <150 mg/dL Final    Comment:                            Normal                   <150                         Borderline High     150 - 199                         High                200 - 499                         Very High                >  54          Passed - Patient is not pregnant      Passed -  Valid encounter within last 12 months    Recent Outpatient Visits          1 month ago Type 2 DM with CKD stage 3 and hypertension ([place])   [place], Megan P, DO   4 months ago Suspected COVID-19 virus infection   [facility] [employer], Megan P, DO   6 months ago Type 2 DM with CKD stage 3 and hypertension ([place])   [place], Megan P, DO   7 months ago Yeast infection   [employer], [place], DO   12 months ago Essential hypertension   [place], Jeannette How, MD      Future Appointments            In 1 month Johnson, Barb Merino, DO [place]   In 8 months  [employer], [place]           . XARELTO 20 MG TABS tablet [Pharmacy Med Name: XARELTO 20MG  TABLETS] 90 tablet 0    Sig: TAKE 1 TABLET(20 MG) BY MOUTH DAILY WITH SUPPER     Hematology: Anticoagulants - rivaroxaban Passed - 05/22/2020  9:40 AM      Passed - ALT in normal range and within 180 days    ALT  Date Value Ref Range Status  04/12/2020 14 0 - 44 IU/L Final   ALT (SGPT) Piccolo, Waived  Date Value Ref Range Status  08/01/2017 33 10 - 47 U/L Final         Passed - AST in normal range and within 180 days    AST  Date Value Ref Range Status  04/12/2020 15 0 - 40 IU/L Final   AST (SGOT) Piccolo, Waived  Date Value Ref Range Status  08/01/2017 34 11 - 38 U/L Final         Passed - Cr in normal range and within 360 days    Creatinine  Date Value Ref Range Status  07/25/2014 1.21 0.60 - 1.30 mg/dL Final   Creatinine, Ser  Date Value Ref Range Status  04/12/2020 0.97 0.76 - 1.27 mg/dL Final         Passed - HCT in normal range and within 360 days    Hematocrit  Date Value Ref Range Status  04/12/2020 43.6 37.5 - 51.0 % Final         Passed - HGB in normal range and within 360 days    Hemoglobin  Date Value Ref Range Status  04/12/2020 14.3 13.0 - 17.7 g/dL Final         Passed -  PLT in normal range and within 360 days    Platelets  Date Value Ref Range Status  04/12/2020 168 150 - 450 x10E3/uL Final         Passed - Valid encounter within last 12 months    Recent Outpatient Visits          1 month ago Type 2 DM with CKD stage 3 and hypertension ([place])   [facility] [place], Megan P, DO   4 months ago Suspected COVID-19 virus infection   [facility] [place], Megan P, DO   6 months ago Type 2 DM with CKD stage 3 and hypertension ([place])   [place], [place], DO  7 months ago Yeast infection   [employer], [place], DO   12 months ago Essential hypertension   [place], MD      Future Appointments            In 1 month Johnson, Barb Merino, DO [facility], [facility]   In 8 months  [employer], [place]

## 2020-06-03 ENCOUNTER — Other Ambulatory Visit: Payer: Self-pay | Admitting: Cardiovascular Disease

## 2020-06-04 ENCOUNTER — Telehealth: Payer: Self-pay | Admitting: General Practice

## 2020-06-04 ENCOUNTER — Ambulatory Visit: Payer: Medicare HMO | Admitting: Nurse Practitioner

## 2020-06-04 ENCOUNTER — Ambulatory Visit (INDEPENDENT_AMBULATORY_CARE_PROVIDER_SITE_OTHER): Payer: Medicare HMO | Admitting: General Practice

## 2020-06-04 DIAGNOSIS — I4821 Permanent atrial fibrillation: Secondary | ICD-10-CM

## 2020-06-04 DIAGNOSIS — G2581 Restless legs syndrome: Secondary | ICD-10-CM

## 2020-06-04 DIAGNOSIS — I10 Essential (primary) hypertension: Secondary | ICD-10-CM | POA: Diagnosis not present

## 2020-06-04 DIAGNOSIS — N183 Chronic kidney disease, stage 3 unspecified: Secondary | ICD-10-CM

## 2020-06-04 DIAGNOSIS — J439 Emphysema, unspecified: Secondary | ICD-10-CM

## 2020-06-04 DIAGNOSIS — I129 Hypertensive chronic kidney disease with stage 1 through stage 4 chronic kidney disease, or unspecified chronic kidney disease: Secondary | ICD-10-CM | POA: Diagnosis not present

## 2020-06-04 DIAGNOSIS — E782 Mixed hyperlipidemia: Secondary | ICD-10-CM

## 2020-06-04 DIAGNOSIS — E1122 Type 2 diabetes mellitus with diabetic chronic kidney disease: Secondary | ICD-10-CM

## 2020-06-04 NOTE — Patient Instructions (Signed)
Visit Information  Goals Addressed              This Visit's Progress   .  RNCM: Pt-" I have not taken my blood sugars in 2 weeks" (pt-stated)        CARE PLAN ENTRY (see longitudinal plan of care for additional care plan information)  Current Barriers:  Marland Kitchen Knowledge Deficits related to the importance of managing diabetes as evidence of hemoglobin A1C of 14 in November 2020, newest hemoglobin A1C 5.8 on 04-12-2020 . 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 Difficulty obtaining medications . Lacks motivation   Nurse Case Manager Clinical Goal(s):  Marland Kitchen Over the next 120 days, patient will verbalize understanding of plan for diabetes management  . Over the next 120 days, patient will work with RNCM, pcp and pharmacist to address needs related to effective diabetes management  . Over the next 120 days, patient will demonstrate a decrease in high blood sugars exacerbations as evidenced by monitoring blood sugars daily, adhering  to dietary restrictions, and following plan of care as recommended by the provider . Over the next 120 days, patient will attend all scheduled medical appointments: appointment with pcp on 04-12-2020 . Over the next 120 days, patient will demonstrate improved adherence to prescribed treatment plan for diabetes as evidenced bychecking blood sugars daily, decrease in hemoglobin A1C, compliance with medications regimen and following the plan of care established by provider . Over the next 120 days, patient will verbalize basic understanding of diabetes disease process and self health management plan as evidenced by compliance with plan of care and decrease in blood sugar levels . Over the next 120 days, patient will demonstrate understanding of rationale for each prescribed medication as evidenced by compliance and ability to obtain medications without cost constraints  . Over the next 120 days, patient will work  with CM team pharmacist to get needed help with cost of prescriptions, obtaining needed refills and being more compliant with medication regimen.   Interventions:  . Inter-disciplinary care team collaboration (see longitudinal plan of care) . Evaluation of current treatment plan related to diabetes management and patient's adherence to plan as established by provider. . Advised patient to check with pharmacy concerning needed refills. The patient states that he was there yesterday and the still do not have scripts for refills. The patient also needs a new glucose meter that his insurance provider will pay for strips and kit. Will send inbasket message to pharmacist and pcp- Completed . Provided education to patient re: on putting am pills he takes in a plastic bag and have with his clothes so he will not forget to take or set an alarm on his phone to remind him to take. The patient admits when he is working he leaves and forgets to take his medications . Reviewed medications with patient and discussed compliance and ways to remember to take medications.  Nash Dimmer with CCM team pharmacist and pcp regarding the need for new glucose machine ( would like one where the insurance will pay for kit and strips), the need for medication scripts, the patient not taking blood glucose levels regularly and not always following dietary restrictions. . Discussed plans with patient for ongoing care management follow up and provided patient with direct contact information for care management team . Advised patient, providing education and rationale, to check cbg at least one time daily and record, calling pcp for findings outside established parameters.   Marland Kitchen  Education on the benefits of a heart healthy/ADA diet in controlling blood sugar levels. The patient eats out a lot and admits he eats more sweets than he should at times.  The patient is now eating one meal a day and has cut back considerably. Has lost weight.  Newest hemoglobin A1C on 04-12-2020 was 5.8. Praised for accomplishments.  . Evaluation of post prandial blood sugars. The patient verbalized the highest post prandial was 172. Fasting is 90's to 110.  He has not checked in a couple days due to being stung by hornets and not feeling the best. Feels better now.  . Education on the negative impact of uncontrolled diabetes on other body systems.  The patient verbalized he needs to do better at taking care of his diabetes.  . Pharmacy referral for help with medication cost, compliance and scripts.   Patient Self Care Activities:  . Patient verbalizes understanding of plan to work with CCM team to help patient manage diabetes more effectively . Self administers medications as prescribed . Attends all scheduled provider appointments . Calls pharmacy for medication refills . Calls provider office for new concerns or questions . Unable to independently manage diabetes as evidence of hemoglobin A1C of 14 in November 2020. Most updated hemoglobin A1C was 5.8 on 04-12-2020. Marland Kitchen Unable to self administer medications as prescribed . Does not adhere to provider recommendations re:  Marland Kitchen Does not adhere to prescribed medication regimen . Does not contact provider office for questions/concerns  Please see past updates related to this goal by clicking on the "Past Updates" button in the selected goal      .  RNCM: Pt-"I have a lot of health issues" (pt-stated)        CARE PLAN ENTRY (see longtitudinal plan of care for additional care plan information)  Current Barriers:  . Chronic Disease Management support, education, and care coordination needs related to Atrial Fibrillation, HTN, HLD, COPD, and RLS . Does not have blood pressure cuff to check blood sugars . No AD/LW . Needs scripts for gabapentin, albuterol inhaler, and cream for athlete's feet  Clinical Goal(s) related to Atrial Fibrillation, HTN, HLD, COPD, and RLS :  Over the next 120 days, patient will:   . Work with the care management team to address educational, disease management, and care coordination needs  . Begin or continue self health monitoring activities as directed today Measure and record blood pressure 2 times per week and adhere to a heart healthy/ADA diet . Call provider office for new or worsened signs and symptoms Blood pressure findings outside established parameters, Oxygen saturation lower than established parameter, Chest pain, Shortness of breath, and New or worsened symptom related to HLD, RLS and other chronic conditions . Call care management team with questions or concerns . Verbalize basic understanding of patient centered plan of care established today  Interventions related to Atrial Fibrillation, HTN, HLD, COPD, and RLS :  . Evaluation of current treatment plans and patient's adherence to plan as established by provider . Assessed patient understanding of disease states . Assessed patient's education and care coordination needs.  The patient needs scripts for his inhaler, gabapentin, athlete's feet and new glucose meter. Also having difficulty with medication cost. Done  . Assessed concern of RLS being a big issue for him right now. He says the pharmacist has not received the script for gabapentin. In basket message to the pharmacist and pcp. Done . Provided disease specific education to patient: Education on heart healthy/ADA diet  and benefits. The patient eats out a lot.  06-04-2020: Has lost weight and is being more mindful of his dietary restrictions.  . Provided education on checking blood pressures at home. The patient does not have a blood pressure cuff. Will work on ways to get a blood pressure cuff for the patient. Plan to give patient a log book at 04-12-2020 appointment for writing down his blood pressures and blood glucose readings.  . Education on AD/LW- the patient agrees to receive a packet to review with his daughter at his visit on 04-12-2020.   Nash Dimmer with appropriate clinical care team members regarding patient needs.  The patient has worked with the pharmacist in the past. Re referral for help with medication cost, obtaining refills, and ways to get the patient to take medication as prescribed.  . Evaluation of bee stings and missed appointment on 06-04-2020 with pcp. The patient states he got stung day before last and wanted to be seen in the office on 06/03/2020. He hung up and did not get the time for appointment. States he did not need to be seen today as he is better. The patient has been taking over the counter benadryl, has swabbed the areas with alcohol and used camomile lotion on the area. The patient verbalized that he has 5 areas where her got stung on his arms. Education on going to ER or urgent care for changes in condition. The patient has not had an allergic reaction to bee stings in the past. Education and support given.   Patient Self Care Activities related to Atrial Fibrillation, HTN, HLD, COPD, and RLS :  . Patient is unable to independently self-manage chronic health conditions  Please see past updates related to this goal by clicking on the "Past Updates" button in the selected goal         Patient verbalizes understanding of instructions provided today.   The care management team will reach out to the patient again over the next 30 to 60 days.   Noreene Larsson RN, MSN, [facility] Mobile: [phone]

## 2020-06-04 NOTE — Chronic Care Management (AMB) (Signed)
Chronic Care Management   Follow Up Note   06/04/2020 Name: Ernest [name] MRN: [id number] DOB: 07-10-1951  Referred by: Guadalupe Maple, MD Reason for referral : Chronic Care Management (Follow up: RNCM Chronic Disease Management and Care Coordination Needs)   Ernest [name] is a 69 y.o. year old male who is a primary care patient of Crissman, Jeannette How, MD. The CCM team was consulted for assistance with chronic disease management and care coordination needs.    Review of patient status, including review of consultants reports, relevant laboratory and other test results, and collaboration with appropriate care team members and the patient's provider was performed as part of comprehensive patient evaluation and provision of chronic care management services.    SDOH (Social Determinants of Health) assessments performed: Yes See Care Plan activities for detailed interventions related to [facility])     Outpatient Encounter Medications as of 06/04/2020  Medication Sig Note  . albuterol (VENTOLIN HFA) 108 (90 Base) MCG/ACT inhaler Inhale 2 puffs into the lungs every 4 (four) hours as needed for wheezing or shortness of breath.   Marland Kitchen atorvastatin (LIPITOR) 80 MG tablet TAKE 1 TABLET(80 MG) BY MOUTH DAILY   . budesonide-formoterol (SYMBICORT) 160-4.5 MCG/ACT inhaler Inhale 2 puffs into the lungs 2 (two) times daily. 02/24/2020: PRN   . canagliflozin (INVOKANA) 300 MG TABS tablet Take 1 tablet (300 mg total) by mouth daily before breakfast.   . clopidogrel (PLAVIX) 75 MG tablet TAKE 1 TABLET(75 MG) BY MOUTH DAILY WITH BREAKFAST   . clotrimazole-betamethasone (LOTRISONE) cream Apply 1 application topically 2 (two) times daily.   Marland Kitchen diltiazem (CARDIZEM CD) 180 MG 24 hr capsule Take 1 capsule (180 mg total) by mouth daily.   . furosemide (LASIX) 40 MG tablet Take 0.5 tablets (20 mg total) by mouth 2 (two) times daily.   Marland Kitchen gabapentin (NEURONTIN) 600 MG tablet Take 2 tablets (1,200 mg total) by mouth at  bedtime.   Marland Kitchen glipiZIDE (GLUCOTROL) 5 MG tablet TAKE 1 TABLET BY MOUTH  DAILY BEFORE BREAKFAST 05/07/2020: Taking QPM  . isosorbide mononitrate (IMDUR) 60 MG 24 hr tablet TAKE 1 TABLET(60 MG) BY MOUTH DAILY   . metFORMIN (GLUCOPHAGE-XR) 500 MG 24 hr tablet Take 2 tablets (1,000 mg total) by mouth 2 (two) times daily.   . metoprolol succinate (TOPROL-XL) 25 MG 24 hr tablet TAKE 1 TABLET(25 MG) BY MOUTH DAILY   . pantoprazole (PROTONIX) 40 MG tablet Take 1 tablet (40 mg total) by mouth daily. 02/24/2020: Omeprazole OTC  . ramipril (ALTACE) 10 MG capsule TAKE 1 CAPSULE(10 MG) BY MOUTH DAILY   . XARELTO 20 MG TABS tablet TAKE 1 TABLET(20 MG) BY MOUTH DAILY WITH SUPPER    No facility-administered encounter medications on file as of 06/04/2020.     Objective:  BP Readings from Last 3 Encounters:  04/12/20 132/74  10/20/19 (!) 148/82  03/08/19 (!) 142/81    Goals Addressed              This Visit's Progress   .  RNCM: Pt-" I have not taken my blood sugars in 2 weeks" (pt-stated)        CARE PLAN ENTRY (see longitudinal plan of care for additional care plan information)  Current Barriers:  Marland Kitchen Knowledge Deficits related to the importance of managing diabetes as evidence of hemoglobin A1C of 14 in November 2020, newest hemoglobin A1C 5.8 on 04-12-2020 . 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  Difficulty obtaining medications . Lacks motivation   Nurse Case Manager Clinical Goal(s):  Marland Kitchen Over the next 120 days, patient will verbalize understanding of plan for diabetes management  . Over the next 120 days, patient will work with RNCM, pcp and pharmacist to address needs related to effective diabetes management  . Over the next 120 days, patient will demonstrate a decrease in high blood sugars exacerbations as evidenced by monitoring blood sugars daily, adhering  to dietary restrictions, and following plan of care as recommended by the  provider . Over the next 120 days, patient will attend all scheduled medical appointments: appointment with pcp on 04-12-2020 . Over the next 120 days, patient will demonstrate improved adherence to prescribed treatment plan for diabetes as evidenced bychecking blood sugars daily, decrease in hemoglobin A1C, compliance with medications regimen and following the plan of care established by provider . Over the next 120 days, patient will verbalize basic understanding of diabetes disease process and self health management plan as evidenced by compliance with plan of care and decrease in blood sugar levels . Over the next 120 days, patient will demonstrate understanding of rationale for each prescribed medication as evidenced by compliance and ability to obtain medications without cost constraints  . Over the next 120 days, patient will work with CM team pharmacist to get needed help with cost of prescriptions, obtaining needed refills and being more compliant with medication regimen.   Interventions:  . Inter-disciplinary care team collaboration (see longitudinal plan of care) . Evaluation of current treatment plan related to diabetes management and patient's adherence to plan as established by provider. . Advised patient to check with pharmacy concerning needed refills. The patient states that he was there yesterday and the still do not have scripts for refills. The patient also needs a new glucose meter that his insurance provider will pay for strips and kit. Will send inbasket message to pharmacist and pcp- Completed . Provided education to patient re: on putting am pills he takes in a plastic bag and have with his clothes so he will not forget to take or set an alarm on his phone to remind him to take. The patient admits when he is working he leaves and forgets to take his medications . Reviewed medications with patient and discussed compliance and ways to remember to take medications.  Nash Dimmer  with CCM team pharmacist and pcp regarding the need for new glucose machine ( would like one where the insurance will pay for kit and strips), the need for medication scripts, the patient not taking blood glucose levels regularly and not always following dietary restrictions. . Discussed plans with patient for ongoing care management follow up and provided patient with direct contact information for care management team . Advised patient, providing education and rationale, to check cbg at least one time daily and record, calling pcp for findings outside established parameters.   . Education on the benefits of a heart healthy/ADA diet in controlling blood sugar levels. The patient eats out a lot and admits he eats more sweets than he should at times.  The patient is now eating one meal a day and has cut back considerably. Has lost weight. Newest hemoglobin A1C on 04-12-2020 was 5.8. Praised for accomplishments.  . Evaluation of post prandial blood sugars. The patient verbalized the highest post prandial was 172. Fasting is 90's to 110.  He has not checked in a couple days due to being stung by hornets and not feeling the best.  Feels better now.  . Education on the negative impact of uncontrolled diabetes on other body systems.  The patient verbalized he needs to do better at taking care of his diabetes.  . Pharmacy referral for help with medication cost, compliance and scripts.   Patient Self Care Activities:  . Patient verbalizes understanding of plan to work with CCM team to help patient manage diabetes more effectively . Self administers medications as prescribed . Attends all scheduled provider appointments . Calls pharmacy for medication refills . Calls provider office for new concerns or questions . Unable to independently manage diabetes as evidence of hemoglobin A1C of 14 in November 2020. Most updated hemoglobin A1C was 5.8 on 04-12-2020. Marland Kitchen Unable to self administer medications as  prescribed . Does not adhere to provider recommendations re:  Marland Kitchen Does not adhere to prescribed medication regimen . Does not contact provider office for questions/concerns  Please see past updates related to this goal by clicking on the "Past Updates" button in the selected goal      .  RNCM: Pt-"I have a lot of health issues" (pt-stated)        CARE PLAN ENTRY (see longtitudinal plan of care for additional care plan information)  Current Barriers:  . Chronic Disease Management support, education, and care coordination needs related to Atrial Fibrillation, HTN, HLD, COPD, and RLS . Does not have blood pressure cuff to check blood sugars . No AD/LW . Needs scripts for gabapentin, albuterol inhaler, and cream for athlete's feet  Clinical Goal(s) related to Atrial Fibrillation, HTN, HLD, COPD, and RLS :  Over the next 120 days, patient will:  . Work with the care management team to address educational, disease management, and care coordination needs  . Begin or continue self health monitoring activities as directed today Measure and record blood pressure 2 times per week and adhere to a heart healthy/ADA diet . Call provider office for new or worsened signs and symptoms Blood pressure findings outside established parameters, Oxygen saturation lower than established parameter, Chest pain, Shortness of breath, and New or worsened symptom related to HLD, RLS and other chronic conditions . Call care management team with questions or concerns . Verbalize basic understanding of patient centered plan of care established today  Interventions related to Atrial Fibrillation, HTN, HLD, COPD, and RLS :  . Evaluation of current treatment plans and patient's adherence to plan as established by provider . Assessed patient understanding of disease states . Assessed patient's education and care coordination needs.  The patient needs scripts for his inhaler, gabapentin, athlete's feet and new glucose meter.  Also having difficulty with medication cost. Done  . Assessed concern of RLS being a big issue for him right now. He says the pharmacist has not received the script for gabapentin. In basket message to the pharmacist and pcp. Done . Provided disease specific education to patient: Education on heart healthy/ADA diet and benefits. The patient eats out a lot.  06-04-2020: Has lost weight and is being more mindful of his dietary restrictions.  . Provided education on checking blood pressures at home. The patient does not have a blood pressure cuff. Will work on ways to get a blood pressure cuff for the patient. Plan to give patient a log book at 04-12-2020 appointment for writing down his blood pressures and blood glucose readings.  . Education on AD/LW- the patient agrees to receive a packet to review with his daughter at his visit on 04-12-2020.  Marland Kitchen Collaborated with appropriate  clinical care team members regarding patient needs.  The patient has worked with the pharmacist in the past. Re referral for help with medication cost, obtaining refills, and ways to get the patient to take medication as prescribed.  . Evaluation of bee stings and missed appointment on 06-04-2020 with pcp. The patient states he got stung day before last and wanted to be seen in the office on 06/03/2020. He hung up and did not get the time for appointment. States he did not need to be seen today as he is better. The patient has been taking over the counter benadryl, has swabbed the areas with alcohol and used camomile lotion on the area. The patient verbalized that he has 5 areas where her got stung on his arms. Education on going to ER or urgent care for changes in condition. The patient has not had an allergic reaction to bee stings in the past. Education and support given.   Patient Self Care Activities related to Atrial Fibrillation, HTN, HLD, COPD, and RLS :  . Patient is unable to independently self-manage chronic health  conditions  Please see past updates related to this goal by clicking on the "Past Updates" button in the selected goal          Plan:   The care management team will reach out to the patient again over the next 30 to 60 days.    Noreene Larsson RN, MSN, [facility] Mobile: [phone]

## 2020-06-25 ENCOUNTER — Other Ambulatory Visit: Payer: Self-pay | Admitting: Family Medicine

## 2020-06-25 NOTE — Telephone Encounter (Signed)
Requested  medications are  due for refill today yes  Requested medications are on the active medication list yes  Last refill 4/11  Future visit scheduled End of July  Notes to clinic On Xarelto (Dr Brita Romp) and Plavix,(Dr Wynetta Emery) just making sure suppose to be on both, uncomfortable approving Rx.

## 2020-06-25 NOTE — Telephone Encounter (Signed)
Routing to provider  

## 2020-06-30 ENCOUNTER — Other Ambulatory Visit: Payer: Self-pay | Admitting: Family Medicine

## 2020-07-12 ENCOUNTER — Ambulatory Visit: Payer: Medicare HMO | Admitting: Family Medicine

## 2020-07-12 NOTE — Progress Notes (Deleted)
There were no vitals taken for this visit.   Subjective:    Patient ID: Ernest [name], male    DOB: 12/24/1950, 69 y.o.   MRN: [id number]  HPI: Ernest [name] is a 69 y.o. male  No chief complaint on file.  DIABETES Hypoglycemic episodes:{Blank single:19197::"yes","no"} Polydipsia/polyuria: {Blank single:19197::"yes","no"} Visual disturbance: {Blank single:19197::"yes","no"} Chest pain: {Blank single:19197::"yes","no"} Paresthesias: {Blank single:19197::"yes","no"} Glucose Monitoring: {Blank single:19197::"yes","no"}  Accucheck frequency: {Blank single:19197::"Not Checking","Daily","BID","TID"}  Fasting glucose:  Post prandial:  Evening:  Before meals: Taking Insulin?: {Blank single:19197::"yes","no"}  Long acting insulin:  Short acting insulin: Blood Pressure Monitoring: {Blank single:19197::"not checking","rarely","daily","weekly","monthly","a few times a day","a few times a week","a few times a month"} Retinal Examination: {Blank single:19197::"Up to Date","Not up to Date"} Foot Exam: {Blank single:19197::"Up to Date","Not up to Date"} Diabetic Education: {Blank single:19197::"Completed","Not Completed"} Pneumovax: {Blank single:19197::"Up to Date","Not up to Date","unknown"} Influenza: {Blank single:19197::"Up to Date","Not up to Date","unknown"} Aspirin: {Blank single:19197::"yes","no"}   Relevant past medical, surgical, family and social history reviewed and updated as indicated. Interim medical history since our last visit reviewed. Allergies and medications reviewed and updated.  Review of Systems  Per HPI unless specifically indicated above     Objective:    There were no vitals taken for this visit.  Wt Readings from Last 3 Encounters:  04/12/20 235 lb 12.8 oz (107 kg)  03/08/19 242 lb 12.8 oz (110.1 kg)  02/04/19 268 lb 4.8 oz (121.7 kg)    Physical Exam  Results for orders placed or performed in visit on 04/12/20  CBC with  Differential/Platelet  Result Value Ref Range   WBC 6.4 3.4 - 10.8 x10E3/uL   RBC 5.09 4.14 - 5.80 x10E6/uL   Hemoglobin 14.3 13.0 - 17.7 g/dL   Hematocrit 43.6 37.5 - 51.0 %   MCV 86 79 - 97 fL   MCH 28.1 26.6 - 33.0 pg   MCHC 32.8 31 - 35 g/dL   RDW 14.8 11.6 - 15.4 %   Platelets 168 150 - 450 x10E3/uL   Neutrophils 51 Not Estab. %   Lymphs 40 Not Estab. %   Monocytes 5 Not Estab. %   Eos 3 Not Estab. %   Basos 1 Not Estab. %   Neutrophils Absolute 3.3 1 - 7 x10E3/uL   Lymphocytes Absolute 2.5 0 - 3 x10E3/uL   Monocytes Absolute 0.3 0 - 0 x10E3/uL   EOS (ABSOLUTE) 0.2 0.0 - 0.4 x10E3/uL   Basophils Absolute 0.0 0 - 0 x10E3/uL   Immature Granulocytes 0 Not Estab. %   Immature Grans (Abs) 0.0 0.0 - 0.1 x10E3/uL  Comprehensive metabolic panel  Result Value Ref Range   Glucose 102 (H) 65 - 99 mg/dL   BUN 20 8 - 27 mg/dL   Creatinine, Ser 0.97 0.76 - 1.27 mg/dL   GFR calc non Af Amer 79 >59 mL/min/1.73   GFR calc Af Amer 92 >59 mL/min/1.73   BUN/Creatinine Ratio 21 10 - 24   Sodium 144 134 - 144 mmol/L   Potassium 4.5 3.5 - 5.2 mmol/L   Chloride 102 96 - 106 mmol/L   CO2 24 20 - 29 mmol/L   Calcium 9.8 8.6 - 10.2 mg/dL   Total Protein 6.4 6.0 - 8.5 g/dL   Albumin 4.4 3.8 - 4.8 g/dL   Globulin, Total 2.0 1.5 - 4.5 g/dL   Albumin/Globulin Ratio 2.2 1.2 - 2.2   Bilirubin Total 0.8 0.0 - 1.2 mg/dL   Alkaline Phosphatase 75 39 - 117 IU/L   AST  15 0 - 40 IU/L   ALT 14 0 - 44 IU/L  Lipid Panel w/o Chol/HDL Ratio  Result Value Ref Range   Cholesterol, Total 90 (L) 100 - 199 mg/dL   Triglycerides 142 0 - 149 mg/dL   HDL 31 (L) >39 mg/dL   VLDL Cholesterol Cal 25 5 - 40 mg/dL   LDL Chol Calc (NIH) 34 0 - 99 mg/dL  Hgb A1c w/o eAG  Result Value Ref Range   Hgb A1c MFr Bld 5.8 (H) 4.8 - 5.6 %      Assessment & Plan:   Problem List Items Addressed This Visit      Cardiovascular and Mediastinum   Type 2 DM with CKD stage 3 and hypertension ([place]) - Primary       Follow  up plan: No follow-ups on file.

## 2020-07-22 ENCOUNTER — Other Ambulatory Visit: Payer: Self-pay | Admitting: Family Medicine

## 2020-07-22 DIAGNOSIS — I129 Hypertensive chronic kidney disease with stage 1 through stage 4 chronic kidney disease, or unspecified chronic kidney disease: Secondary | ICD-10-CM

## 2020-07-22 DIAGNOSIS — I10 Essential (primary) hypertension: Secondary | ICD-10-CM

## 2020-07-22 DIAGNOSIS — E1122 Type 2 diabetes mellitus with diabetic chronic kidney disease: Secondary | ICD-10-CM

## 2020-07-28 ENCOUNTER — Telehealth: Payer: Self-pay | Admitting: Pharmacist

## 2020-07-28 ENCOUNTER — Telehealth: Payer: Self-pay

## 2020-07-28 NOTE — Progress Notes (Signed)
  Chronic Care Management   Outreach Note  07/28/2020 Name: Ernest [name] MRN: [id number] DOB: 1951/02/01  Referred by: Guadalupe Maple, MD Reason for referral : Appointment (CCM Type 2 DM with CKD stage 3 HTN follow up)   Attempted to contact patient for medication management review. Unable to leave message on any of five numbers listed for patient.   Junita Push. Kenton Kingfisher PharmD, [facility] [phone]

## 2020-07-30 ENCOUNTER — Telehealth: Payer: Self-pay

## 2020-07-30 ENCOUNTER — Telehealth: Payer: Self-pay | Admitting: General Practice

## 2020-07-30 NOTE — Telephone Encounter (Signed)
  Chronic Care Management   Outreach Note  07/30/2020 Name: Ernest [name] MRN: [id number] DOB: [birth year]  Referred by: Guadalupe Maple, MD Reason for referral : Appointment (RNCM Follow up Call for Chronic Disease Management and Care Coordination Needs)   An unsuccessful telephone outreach was attempted today. The patient was referred to the case management team for assistance with care management and care coordination.   Follow Up Plan: The care management team will reach out to the patient again over the next 30 to 60 days.   Noreene Larsson RN, MSN, [facility] Mobile: [phone]

## 2020-08-03 ENCOUNTER — Telehealth: Payer: Self-pay

## 2020-08-03 NOTE — Chronic Care Management (AMB) (Signed)
  Care Management   Note  08/03/2020 Name: Ernest [name] MRN: [id number] DOB: 06/22/1951  [name] is a 69 y.o. year old male who is a primary care patient of Crissman, Jeannette How, MD and is actively engaged with the care management team. I reached out to [name] by phone today to assist with re-scheduling a follow up visit with the RN Case Manager  Follow up plan: Unsuccessful telephone outreach attempt made.The care management team will reach out to the patient again over the next 7 days.  If patient returns call to provider office, please advise to call [place]  at [phone]  [facility]

## 2020-08-11 NOTE — Chronic Care Management (AMB) (Signed)
  Care Management   Note  08/11/2020 Name: Ernest [name] MRN: [id number] DOB: 01/12/1951  Ernest [name] is a 69 y.o. year old male who is a primary care patient of Crissman, Jeannette How, MD and is actively engaged with the care management team. I reached out to [name] by phone today to assist with re-scheduling a follow up visit with the RN Case Manager  Follow up plan: Unsuccessful telephone outreach attempt made. A HIPPA compliant phone message was left for the patient providing contact information and requesting a return call.  The care management team will reach out to the patient again over the next 7 days.  If patient returns call to provider office, please advise to call [place]  at [place], [place], [place], [place] [address] Direct Dial: [phone] [email or web address] Website: [email or web address]

## 2020-08-18 NOTE — Telephone Encounter (Signed)
3rd unsuccessful outreach  

## 2020-08-18 NOTE — Chronic Care Management (AMB) (Signed)
  Care Management   Note  08/18/2020 Name: Ernest [name] MRN: [id number] DOB: 06/10/1951  Ernest [name] is a 69 y.o. year old male who is a primary care patient of Crissman, Jeannette How, MD and is actively engaged with the care management team. I reached out to Ernest [name] by phone today to assist with re-scheduling a follow up visit with the RN Case Manager  Follow up plan: Unable to make contact on outreach attempts x 3. PCP Park Liter, DO and Dellie Catholic RN CM  notified via routed documentation in medical record.   Noreene Larsson, [place], [place], [place] [address] Direct Dial: [phone] [email or web address] Website: [email or web address]

## 2020-08-28 ENCOUNTER — Other Ambulatory Visit: Payer: Self-pay | Admitting: Family Medicine

## 2020-09-03 ENCOUNTER — Other Ambulatory Visit: Payer: Self-pay | Admitting: Family Medicine

## 2020-09-03 NOTE — Telephone Encounter (Signed)
Routing to provider  

## 2020-09-03 NOTE — Telephone Encounter (Signed)
Unable to leave a message. Phone kept ringing.

## 2020-09-03 NOTE — Telephone Encounter (Signed)
   Notes to clinic:  medication filled by a different provider  Review for refill   Requested Prescriptions  Pending Prescriptions Disp Refills   diltiazem (CARDIZEM CD) 180 MG 24 hr capsule [Pharmacy Med Name: DILTIAZEM CD 180MG  CAPSULES (24 HR)] 90 capsule 3    Sig: TAKE 1 CAPSULE(180 MG) BY MOUTH DAILY      There is no refill protocol information for this order

## 2020-09-06 NOTE — Telephone Encounter (Signed)
Called patient to schedule a follow up. No answer and phone keeps ringing.

## 2020-09-10 NOTE — Telephone Encounter (Signed)
Called pt main number has been changed/disconnected. Called alternate number no answer. Per provider note pt needs to reach out to Cardiology to refill this medication

## 2020-09-10 NOTE — Telephone Encounter (Signed)
Can you guys try to schedule this please?

## 2020-09-16 ENCOUNTER — Other Ambulatory Visit: Payer: Self-pay | Admitting: Family Medicine

## 2020-09-16 NOTE — Telephone Encounter (Signed)
Requested Prescriptions  Pending Prescriptions Disp Refills  . albuterol (VENTOLIN HFA) 108 (90 Base) MCG/ACT inhaler [Pharmacy Med Name: ALBUTEROL HFA INH(200 PUFFS)18GM] 18 g 6    Sig: INHALE 2 PUFFS INTO THE LUNGS EVERY 4 HOURS AS NEEDED FOR WHEEZING OR SHORTNESS OF BREATH     Pulmonology:  Beta Agonists Failed - 09/16/2020  3:11 AM      Failed - One inhaler should last at least one month. If the patient is requesting refills earlier, contact the patient to check for uncontrolled symptoms.      Passed - Valid encounter within last 12 months    Recent Outpatient Visits          5 months ago Type 2 DM with CKD stage 3 and hypertension ([place])   [place], Megan P, DO   8 months ago Suspected COVID-19 virus infection   [facility] [employer], Megan P, DO   10 months ago Type 2 DM with CKD stage 3 and hypertension ([place])   [place], Megan P, DO   11 months ago Yeast infection   [employer], [place], DO   1 year ago Essential hypertension   [place], Jeannette How, MD      Future Appointments            In 5 months [place], [facility]

## 2020-09-30 ENCOUNTER — Other Ambulatory Visit: Payer: Self-pay | Admitting: Family Medicine

## 2020-09-30 DIAGNOSIS — G2581 Restless legs syndrome: Secondary | ICD-10-CM

## 2020-10-16 ENCOUNTER — Other Ambulatory Visit: Payer: Self-pay | Admitting: Family Medicine

## 2020-10-29 ENCOUNTER — Other Ambulatory Visit: Payer: Self-pay | Admitting: Family Medicine

## 2020-10-29 DIAGNOSIS — N183 Chronic kidney disease, stage 3 unspecified: Secondary | ICD-10-CM

## 2020-10-29 DIAGNOSIS — I129 Hypertensive chronic kidney disease with stage 1 through stage 4 chronic kidney disease, or unspecified chronic kidney disease: Secondary | ICD-10-CM

## 2020-10-29 DIAGNOSIS — I10 Essential (primary) hypertension: Secondary | ICD-10-CM

## 2020-10-29 NOTE — Telephone Encounter (Signed)
Requested medications are due for refill today yes  Requested medications are on the active medication list yes  Last refill 8/17  Last visit 03/2020  Future visit scheduled 01/2021  Notes to clinic Last office note states return in 4 months, protocol is for 6 months, next visit scheduled 01/2021

## 2020-10-29 NOTE — Telephone Encounter (Signed)
Called pt one number doesn't work the other number just rings no vm

## 2020-10-29 NOTE — Telephone Encounter (Signed)
Needs an appointment. Will get him enough medicine to make it to appointment when it's booked.   

## 2020-10-29 NOTE — Telephone Encounter (Signed)
Patient last seen in April and was asked to follow up in July. Routing to admin for appointment as patient is overdue and routing to admin for refill.

## 2020-11-01 NOTE — Telephone Encounter (Signed)
Called pt cell number doesn't work it is disconnected Home number no vm

## 2020-11-02 ENCOUNTER — Other Ambulatory Visit: Payer: Self-pay | Admitting: Family Medicine

## 2020-11-02 ENCOUNTER — Encounter: Payer: Self-pay | Admitting: Family Medicine

## 2020-11-02 DIAGNOSIS — I129 Hypertensive chronic kidney disease with stage 1 through stage 4 chronic kidney disease, or unspecified chronic kidney disease: Secondary | ICD-10-CM

## 2020-11-02 DIAGNOSIS — I10 Essential (primary) hypertension: Secondary | ICD-10-CM

## 2020-11-02 DIAGNOSIS — N183 Chronic kidney disease, stage 3 unspecified: Secondary | ICD-10-CM

## 2020-11-02 DIAGNOSIS — E1122 Type 2 diabetes mellitus with diabetic chronic kidney disease: Secondary | ICD-10-CM

## 2020-11-02 NOTE — Telephone Encounter (Signed)
Notes to clinic:  Patient is requesting a 90 day supply   Requested Prescriptions  Pending Prescriptions Disp Refills   ramipril (ALTACE) 10 MG capsule [Pharmacy Med Name: RAMIPRIL 10MG  CAPSULES] 90 capsule     Sig: TAKE 1 CAPSULE(10 MG) BY MOUTH DAILY      Cardiovascular:  ACE Inhibitors Failed - 11/02/2020  1:57 PM      Failed - Cr in normal range and within 180 days    Creatinine  Date Value Ref Range Status  07/25/2014 1.21 0.60 - 1.30 mg/dL Final   Creatinine, Ser  Date Value Ref Range Status  04/12/2020 0.97 0.76 - 1.27 mg/dL Final          Failed - K in normal range and within 180 days    Potassium  Date Value Ref Range Status  04/12/2020 4.5 3.5 - 5.2 mmol/L Final  07/25/2014 3.7 3.5 - 5.1 mmol/L Final          Failed - Valid encounter within last 6 months    Recent Outpatient Visits           6 months ago Type 2 DM with CKD stage 3 and hypertension ([place])   [facility] [employer], Megan P, DO   9 months ago Suspected COVID-19 virus infection   [employer], Megan P, DO   11 months ago Type 2 DM with CKD stage 3 and hypertension ([place])   [place], Megan P, DO   1 year ago Yeast infection   [employer], [place], DO   1 year ago Essential hypertension   [place] [facility], Jeannette How, MD       Future Appointments             In 3 months [place], [place] - Patient is not pregnant      Passed - Last BP in normal range    BP Readings from Last 1 Encounters:  04/12/20 132/74            metoprolol succinate (TOPROL-XL) 25 MG 24 hr tablet [Pharmacy Med Name: METOPROLOL ER SUCCINATE 25MG  TABS] 90 tablet     Sig: TAKE 1 TABLET(25 MG) BY MOUTH DAILY      Cardiovascular:  Beta Blockers Failed - 11/02/2020  1:57 PM      Failed - Valid encounter within last 6 months    Recent Outpatient Visits           6 months ago Type 2 DM with  CKD stage 3 and hypertension ([place])   [place], Megan P, DO   9 months ago Suspected COVID-19 virus infection   [facility] [employer], Megan P, DO   11 months ago Type 2 DM with CKD stage 3 and hypertension ([place])   [place], [place], DO   1 year ago Yeast infection   [employer], [place], DO   1 year ago Essential hypertension   [place], MD       Future Appointments             In 3 months [place], [facility]             Passed - Last BP in normal range    BP Readings from Last 1 Encounters:  04/12/20 132/74  Passed - Last Heart Rate in normal range    Pulse Readings from Last 1 Encounters:  04/12/20 74

## 2020-11-02 NOTE — Telephone Encounter (Signed)
3rd attempt Called pt cell number doesn't work it is disconnected Home number no vm.Sent letter.

## 2020-11-02 NOTE — Telephone Encounter (Signed)
FYI

## 2020-11-03 ENCOUNTER — Other Ambulatory Visit: Payer: Self-pay | Admitting: Family Medicine

## 2020-11-03 DIAGNOSIS — I482 Chronic atrial fibrillation, unspecified: Secondary | ICD-10-CM

## 2020-11-03 NOTE — Telephone Encounter (Signed)
Requested medication (s) are due for refill today: no  Requested medication (s) are on the active medication list: yes    Future visit scheduled: no  Notes to clinic: Attempted to contact patient no answer    Requested Prescriptions  Pending Prescriptions Disp Refills   XARELTO 20 MG TABS tablet [Pharmacy Med Name: XARELTO 20MG  TABLETS] 90 tablet 0    Sig: TAKE 1 TABLET(20 MG) BY MOUTH DAILY WITH SUPPER      Hematology: Anticoagulants - rivaroxaban Failed - 11/03/2020  2:06 PM      Failed - ALT in normal range and within 180 days    ALT  Date Value Ref Range Status  04/12/2020 14 0 - 44 IU/L Final   ALT (SGPT) [place], Waived  Date Value Ref Range Status  08/01/2017 33 10 - 47 U/L Final          Failed - AST in normal range and within 180 days    AST  Date Value Ref Range Status  04/12/2020 15 0 - 40 IU/L Final   AST (SGOT) [place], Waived  Date Value Ref Range Status  08/01/2017 34 11 - 38 U/L Final          Passed - Cr in normal range and within 360 days    Creatinine  Date Value Ref Range Status  07/25/2014 1.21 0.60 - 1.30 mg/dL Final   Creatinine, Ser  Date Value Ref Range Status  04/12/2020 0.97 0.76 - 1.27 mg/dL Final          Passed - HCT in normal range and within 360 days    Hematocrit  Date Value Ref Range Status  04/12/2020 43.6 37.5 - 51.0 % Final          Passed - HGB in normal range and within 360 days    Hemoglobin  Date Value Ref Range Status  04/12/2020 14.3 13.0 - 17.7 g/dL Final          Passed - PLT in normal range and within 360 days    Platelets  Date Value Ref Range Status  04/12/2020 168 150 - 450 x10E3/uL Final          Passed - Valid encounter within last 12 months    Recent Outpatient Visits           6 months ago Type 2 DM with CKD stage 3 and hypertension ([place])   [facility] [place], Megan P, DO   9 months ago Suspected COVID-19 virus infection   [facility] [employer], Megan P, DO   11  months ago Type 2 DM with CKD stage 3 and hypertension ([place])   [place], Megan P, DO   1 year ago Yeast infection   [employer], [place], DO   1 year ago Essential hypertension   [facility], Jeannette How, MD       Future Appointments             In 3 months [place], [place]

## 2020-11-15 ENCOUNTER — Other Ambulatory Visit: Payer: Self-pay | Admitting: Family Medicine

## 2020-11-19 ENCOUNTER — Encounter: Payer: Self-pay | Admitting: Nurse Practitioner

## 2020-11-19 ENCOUNTER — Telehealth: Payer: Self-pay

## 2020-11-19 ENCOUNTER — Ambulatory Visit (INDEPENDENT_AMBULATORY_CARE_PROVIDER_SITE_OTHER): Payer: Medicare HMO | Admitting: Nurse Practitioner

## 2020-11-19 ENCOUNTER — Other Ambulatory Visit: Payer: Self-pay

## 2020-11-19 DIAGNOSIS — N400 Enlarged prostate without lower urinary tract symptoms: Secondary | ICD-10-CM

## 2020-11-19 DIAGNOSIS — E1159 Type 2 diabetes mellitus with other circulatory complications: Secondary | ICD-10-CM | POA: Diagnosis not present

## 2020-11-19 DIAGNOSIS — Z23 Encounter for immunization: Secondary | ICD-10-CM | POA: Diagnosis not present

## 2020-11-19 DIAGNOSIS — Z9989 Dependence on other enabling machines and devices: Secondary | ICD-10-CM

## 2020-11-19 DIAGNOSIS — J439 Emphysema, unspecified: Secondary | ICD-10-CM | POA: Diagnosis not present

## 2020-11-19 DIAGNOSIS — I4821 Permanent atrial fibrillation: Secondary | ICD-10-CM

## 2020-11-19 DIAGNOSIS — E785 Hyperlipidemia, unspecified: Secondary | ICD-10-CM

## 2020-11-19 DIAGNOSIS — E1169 Type 2 diabetes mellitus with other specified complication: Secondary | ICD-10-CM | POA: Diagnosis not present

## 2020-11-19 DIAGNOSIS — G4733 Obstructive sleep apnea (adult) (pediatric): Secondary | ICD-10-CM | POA: Diagnosis not present

## 2020-11-19 DIAGNOSIS — I152 Hypertension secondary to endocrine disorders: Secondary | ICD-10-CM

## 2020-11-19 MED ORDER — CLOPIDOGREL BISULFATE 75 MG PO TABS: ORAL_TABLET | ORAL | 4 refills | Status: DC

## 2020-11-19 NOTE — Assessment & Plan Note (Signed)
Chronic, ongoing with initial BP elevated, but repeat closer to goal.  Continue current medication regimen and adjust as needed.  Highly recommended he follow-up with cardiology.  Refills sent in as needed.  Labs today CMP, CBC, and TSH.  Recommend he monitor BP at home daily and document for provider + focus on DASH diet.  Return in 3 months.

## 2020-11-19 NOTE — Assessment & Plan Note (Signed)
Recommend he start using CPAP 100% of time for prevention.

## 2020-11-19 NOTE — Assessment & Plan Note (Signed)
Chronic, ongoing with last A1C 5.8% in April.  Will recheck today, may benefit from medication reduction due to age if A1C remains stable.  Could consider reduction to discontinuation of Glipizide.  At this time continue current medication until A1C resulted.  Recommend he check BS at home daily and document for provider visits.  Labs today.  Return to office in 3 months to meet new PCP.

## 2020-11-19 NOTE — Telephone Encounter (Signed)
erroneous entry 

## 2020-11-19 NOTE — Assessment & Plan Note (Signed)
Chronic, ongoing.  Continue current medication regimen and adjust as needed. Lipid panel today. 

## 2020-11-19 NOTE — Assessment & Plan Note (Signed)
Chronic, ongoing.  Educated him on inhalers -- to use Symbicort twice a day and Albuterol PRN, which may help his intermittent SOB if taking regimen correctly.  Recommend continued cessation of smoking.  Return to office in 3 months, would benefit from spirometry.

## 2020-11-19 NOTE — Progress Notes (Signed)
BP 138/70 (BP Location: Left Arm)   Pulse 92   Temp 98.2 F (36.8 C)   Wt 263 lb 3.2 oz (119.4 kg)   SpO2 98%   BMI 39.21 kg/m    Subjective:    Patient ID: Ernest [name], male    DOB: 04/23/1951, 69 y.o.   MRN: [id number]  HPI: Ernest [name] is a 69 y.o. male  Chief Complaint  Patient presents with  . Diabetes  . Chronic Kidney Disease  . Hypertension   DIABETES Currently continues on Glipizide, Metformin, and Invokana.  Last A1C 5.8%, but has retired and gained [employer] -- "started being [employer] again". Hypoglycemic episodes:no Polydipsia/polyuria: no Visual disturbance: no Chest pain: no Paresthesias: no Glucose Monitoring: yes  Accucheck frequency: every other day  Fasting glucose: <150  Post prandial:  Evening:  Before meals: Taking Insulin?: no  Long acting insulin:  Short acting insulin: Blood Pressure Monitoring: not checking Retinal Examination: Not up to Date -- too costly is $300 Foot Exam: Up To Date Pneumovax: Up to Date Influenza: Up to Date Aspirin: no   HYPERTENSION / HYPERLIPIDEMIA History of stents and continues on Plavix, stents 2 years ago.  Continues on Ramipril, Metoprolol, Isosorbide Mono, Diltiazem, Lasix, and Atorvastatin.  Previously saw Dr. Rockey Situ for cardiology, last visit March 2020.  Does not use CPAP, does not like it. Satisfied with current treatment? yes Duration of hypertension: chronic BP monitoring frequency: not checking BP range:  BP medication side effects: no Duration of hyperlipidemia: chronic Cholesterol medication side effects: no Cholesterol supplements: none Medication compliance: good compliance Aspirin: no Recent stressors: no Recurrent headaches: no Visual changes: no Palpitations: no Dyspnea: no Chest pain: no Lower extremity edema: no Dizzy/lightheaded: no   ATRIAL FIBRILLATION Atrial fibrillation status: stable Satisfied with current treatment: yes  Medication side effects:  no  Medication compliance: good compliance Etiology of atrial fibrillation:  Palpitations:  no Chest pain:  no Dyspnea on exertion:  no Orthopnea:  no -- sleeps up on 3 pillows at baseline without SOB Syncope:  no Edema:  no Ventricular rate control: B-blocker Anti-coagulation: long acting   COPD Used to smoke 4 PPD, stopped at age 86 -- 28 years ago.  Uses Symbicort, but half the time he forgets it.  Uses Albuterol twice day. COPD status: stable Satisfied with current treatment?: yes Oxygen use: no Dyspnea frequency: occasional with weight gain Cough frequency: none Rescue inhaler frequency:  Twice a day Limitation of activity: no Productive cough: none Last Spirometry: unknown Pneumovax: Up to Date Influenza: Up to Date  Relevant past medical, surgical, family and social history reviewed and updated as indicated. Interim medical history since our last visit reviewed. Allergies and medications reviewed and updated.  Review of Systems  Constitutional: Negative for activity change, diaphoresis, fatigue and fever.  Respiratory: Negative for cough, chest tightness, shortness of breath and wheezing.   Cardiovascular: Negative for chest pain, palpitations and leg swelling.  Gastrointestinal: Negative.   Endocrine: Negative for cold intolerance, heat intolerance, polydipsia, polyphagia and polyuria.  Neurological: Negative.   Psychiatric/Behavioral: Negative.     Per HPI unless specifically indicated above     Objective:    BP 138/70 (BP Location: Left Arm)   Pulse 92   Temp 98.2 F (36.8 C)   Wt 263 lb 3.2 oz (119.4 kg)   SpO2 98%   BMI 39.21 kg/m   Wt Readings from Last 3 Encounters:  11/19/20 263 lb 3.2 oz (119.4 kg)  04/12/20  235 lb 12.8 oz (107 kg)  03/08/19 242 lb 12.8 oz (110.1 kg)    Physical Exam Vitals and nursing note reviewed.  Constitutional:      General: He is awake. He is not in acute distress.    Appearance: He is well-developed and well-groomed. He  is morbidly obese. He is not ill-appearing or toxic-appearing.  HENT:     Head: Normocephalic and atraumatic.     Right Ear: Hearing normal. No drainage.     Left Ear: Hearing normal. No drainage.  Eyes:     General: Lids are normal.        Right eye: No discharge.        Left eye: No discharge.     Conjunctiva/sclera: Conjunctivae normal.     Pupils: Pupils are equal, round, and reactive to light.  Neck:     Thyroid: No thyromegaly.     Vascular: No carotid bruit.     Trachea: Trachea normal.  Cardiovascular:     Rate and Rhythm: Normal rate and regular rhythm.     Heart sounds: Normal heart sounds, S1 normal and S2 normal. No murmur heard.  No gallop.   Pulmonary:     Effort: Pulmonary effort is normal. No accessory muscle usage or respiratory distress.     Breath sounds: Normal breath sounds.  Abdominal:     General: Bowel sounds are normal.     Palpations: Abdomen is soft.  Musculoskeletal:        General: Normal range of motion.     Cervical back: Normal range of motion and neck supple.     Right lower leg: No edema.     Left lower leg: No edema.  Skin:    General: Skin is warm and dry.     Capillary Refill: Capillary refill takes less than 2 seconds.  Neurological:     Mental Status: He is alert and oriented to person, place, and time.     Deep Tendon Reflexes: Reflexes are normal and symmetric.  Psychiatric:        Attention and Perception: Attention normal.        Mood and Affect: Mood normal.        Speech: Speech normal.        Behavior: Behavior normal. Behavior is cooperative.        Thought Content: Thought content normal.    Results for orders placed or performed in visit on 04/12/20  CBC with Differential/Platelet  Result Value Ref Range   WBC 6.4 3.4 - 10.8 x10E3/uL   RBC 5.09 4.14 - 5.80 x10E6/uL   Hemoglobin 14.3 13.0 - 17.7 g/dL   Hematocrit 43.6 37.5 - 51.0 %   MCV 86 79 - 97 fL   MCH 28.1 26.6 - 33.0 pg   MCHC 32.8 31 - 35 g/dL   RDW 14.8  11.6 - 15.4 %   Platelets 168 150 - 450 x10E3/uL   Neutrophils 51 Not Estab. %   Lymphs 40 Not Estab. %   Monocytes 5 Not Estab. %   Eos 3 Not Estab. %   Basos 1 Not Estab. %   Neutrophils Absolute 3.3 1.40 - 7.00 x10E3/uL   Lymphocytes Absolute 2.5 0 - 3 x10E3/uL   Monocytes Absolute 0.3 0 - 0 x10E3/uL   EOS (ABSOLUTE) 0.2 0.0 - 0.4 x10E3/uL   Basophils Absolute 0.0 0 - 0 x10E3/uL   Immature Granulocytes 0 Not Estab. %   Immature Grans (Abs) 0.0 0.0 - 0.1  x10E3/uL  Comprehensive metabolic panel  Result Value Ref Range   Glucose 102 (H) 65 - 99 mg/dL   BUN 20 8 - 27 mg/dL   Creatinine, Ser 0.97 0.76 - 1.27 mg/dL   GFR calc non Af Amer 79 >59 mL/min/1.73   GFR calc Af Amer 92 >59 mL/min/1.73   BUN/Creatinine Ratio 21 10 - 24   Sodium 144 134 - 144 mmol/L   Potassium 4.5 3.5 - 5.2 mmol/L   Chloride 102 96 - 106 mmol/L   CO2 24 20 - 29 mmol/L   Calcium 9.8 8.6 - 10.2 mg/dL   Total Protein 6.4 6.0 - 8.5 g/dL   Albumin 4.4 3.8 - 4.8 g/dL   Globulin, Total 2.0 1.5 - 4.5 g/dL   Albumin/Globulin Ratio 2.2 1.2 - 2.2   Bilirubin Total 0.8 0.0 - 1.2 mg/dL   Alkaline Phosphatase 75 39 - 117 IU/L   AST 15 0 - 40 IU/L   ALT 14 0 - 44 IU/L  Lipid Panel w/o Chol/HDL Ratio  Result Value Ref Range   Cholesterol, Total 90 (L) 100 - 199 mg/dL   Triglycerides 142 0 - 149 mg/dL   HDL 31 (L) >39 mg/dL   VLDL Cholesterol Cal 25 5 - 40 mg/dL   LDL Chol Calc (NIH) 34 0 - 99 mg/dL  Hgb A1c w/o eAG  Result Value Ref Range   Hgb A1c MFr Bld 5.8 (H) 4.8 - 5.6 %      Assessment & Plan:   Problem List Items Addressed This Visit      Cardiovascular and Mediastinum   Atrial fibrillation (HCC)    Chronic with good rate control.  Continue current regimen and highly recommend he follow-up with cardiology as soon as possible and then annually.      Hypertension associated with diabetes ([place])    Chronic, ongoing with initial BP elevated, but repeat closer to goal.  Continue current medication  regimen and adjust as needed.  Highly recommended he follow-up with cardiology.  Refills sent in as needed.  Labs today CMP, CBC, and TSH.  Recommend he monitor BP at home daily and document for provider + focus on DASH diet.  Return in 3 months.      Relevant Orders   TSH   CBC with Differential/Platelet   Comprehensive metabolic panel   HgB V6H     Respiratory   OSA on CPAP    Recommend he start using CPAP 100% of time for prevention.      COPD (chronic obstructive pulmonary disease) with emphysema (HCC)    Chronic, ongoing.  Educated him on inhalers -- to use Symbicort twice a day and Albuterol PRN, which may help his intermittent SOB if taking regimen correctly.  Recommend continued cessation of smoking.  Return to office in 3 months, would benefit from spirometry.        Endocrine   Hyperlipidemia associated with type 2 diabetes mellitus (HCC)    Chronic, ongoing.  Continue current medication regimen and adjust as needed.  Lipid panel today.         Relevant Orders   Lipid Panel w/o Chol/HDL Ratio   HgB A1c   Type 2 diabetes mellitus with morbid obesity ([place]) - Primary    Chronic, ongoing with last A1C 5.8% in April.  Will recheck today, may benefit from medication reduction due to age if A1C remains stable.  Could consider reduction to discontinuation of Glipizide.  At this time continue current medication until  A1C resulted.  Recommend he check BS at home daily and document for provider visits.  Labs today.  Return to office in 3 months to meet new PCP.      Relevant Orders   HgB A1c     Genitourinary   BPH (benign prostatic hyperplasia)    Check PSA today.      Relevant Orders   PSA     Other   Morbid (severe) obesity due to excess calories (HCC)    BMI 39.21 with A-fib, T2DM, HTN.  Recommended eating smaller high protein, low fat meals more frequently and exercising 30 mins a day 5 times a week with a goal of 10-15lb weight loss in the next 3 months. Patient  voiced their understanding and motivation to adhere to these recommendations.        Other Visit Diagnoses    Need for influenza vaccination       Flu vaccine today   Relevant Orders   Flu Vaccine QUAD High Dose(Fluad) (Completed)       Follow up plan: Return in about 3 months (around 02/17/2021) for T2DM, HTN/HLD, COPD -- spirometry needed -- MEET NEW PCP.

## 2020-11-19 NOTE — Assessment & Plan Note (Signed)
BMI 39.21 with A-fib, T2DM, HTN.  Recommended eating smaller high protein, low fat meals more frequently and exercising 30 mins a day 5 times a week with a goal of 10-15lb weight loss in the next 3 months. Patient voiced their understanding and motivation to adhere to these recommendations.

## 2020-11-19 NOTE — Assessment & Plan Note (Signed)
Check PSA today. 

## 2020-11-19 NOTE — Assessment & Plan Note (Signed)
Chronic with good rate control.  Continue current regimen and highly recommend he follow-up with cardiology as soon as possible and then annually.

## 2020-11-19 NOTE — Patient Instructions (Signed)

## 2020-11-20 LAB — COMPREHENSIVE METABOLIC PANEL
ALT: 13 IU/L (ref 0–44)
AST: 17 IU/L (ref 0–40)
Albumin/Globulin Ratio: 2 (ref 1.2–2.2)
Albumin: 4.3 g/dL (ref 3.8–4.8)
Alkaline Phosphatase: 97 IU/L (ref 44–121)
BUN/Creatinine Ratio: 12 (ref 10–24)
BUN: 13 mg/dL (ref 8–27)
Bilirubin Total: 1 mg/dL (ref 0.0–1.2)
CO2: 24 mmol/L (ref 20–29)
Calcium: 9.2 mg/dL (ref 8.6–10.2)
Chloride: 102 mmol/L (ref 96–106)
Creatinine, Ser: 1.07 mg/dL (ref 0.76–1.27)
GFR calc Af Amer: 81 mL/min/{1.73_m2} (ref >59)
GFR calc non Af Amer: 70 mL/min/{1.73_m2} (ref >59)
Globulin, Total: 2.2 g/dL (ref 1.5–4.5)
Glucose: 256 mg/dL — ABNORMAL HIGH (ref 65–99)
Potassium: 4.5 mmol/L (ref 3.5–5.2)
Sodium: 143 mmol/L (ref 134–144)
Total Protein: 6.5 g/dL (ref 6.0–8.5)

## 2020-11-20 LAB — CBC WITH DIFFERENTIAL/PLATELET
Basophils Absolute: 0 10*3/uL (ref 0.0–0.2)
Basos: 0 %
EOS (ABSOLUTE): 0.2 10*3/uL (ref 0.0–0.4)
Eos: 3 %
Hematocrit: 37.7 % (ref 37.5–51.0)
Hemoglobin: 11.5 g/dL — ABNORMAL LOW (ref 13.0–17.7)
Immature Grans (Abs): 0 10*3/uL (ref 0.0–0.1)
Immature Granulocytes: 0 %
Lymphocytes Absolute: 2.4 10*3/uL (ref 0.7–3.1)
Lymphs: 32 %
MCH: 25 pg — ABNORMAL LOW (ref 26.6–33.0)
MCHC: 30.5 g/dL — ABNORMAL LOW (ref 31.5–35.7)
MCV: 82 fL (ref 79–97)
Monocytes Absolute: 0.5 10*3/uL (ref 0.1–0.9)
Monocytes: 6 %
Neutrophils Absolute: 4.3 10*3/uL (ref 1.4–7.0)
Neutrophils: 59 %
Platelets: 172 10*3/uL (ref 150–450)
RBC: 4.6 x10E6/uL (ref 4.14–5.80)
RDW: 14.5 % (ref 11.6–15.4)
WBC: 7.5 10*3/uL (ref 3.4–10.8)

## 2020-11-20 LAB — HEMOGLOBIN A1C
Est. average glucose Bld gHb Est-mCnc: 140 mg/dL
Hgb A1c MFr Bld: 6.5 % — ABNORMAL HIGH (ref 4.8–5.6)

## 2020-11-20 LAB — LIPID PANEL W/O CHOL/HDL RATIO
Cholesterol, Total: 98 mg/dL — ABNORMAL LOW (ref 100–199)
HDL: 31 mg/dL — ABNORMAL LOW (ref >39)
LDL Chol Calc (NIH): 38 mg/dL (ref 0–99)
Triglycerides: 176 mg/dL — ABNORMAL HIGH (ref 0–149)
VLDL Cholesterol Cal: 29 mg/dL (ref 5–40)

## 2020-11-20 LAB — PSA: Prostate Specific Ag, Serum: 0.7 ng/mL (ref 0.0–4.0)

## 2020-11-20 LAB — TSH: TSH: 4.91 u[IU]/mL — ABNORMAL HIGH (ref 0.450–4.500)

## 2020-11-21 ENCOUNTER — Encounter: Payer: Self-pay | Admitting: Nurse Practitioner

## 2020-11-21 ENCOUNTER — Other Ambulatory Visit: Payer: Self-pay | Admitting: Nurse Practitioner

## 2020-11-21 DIAGNOSIS — R7989 Other specified abnormal findings of blood chemistry: Secondary | ICD-10-CM

## 2020-11-21 DIAGNOSIS — D649 Anemia, unspecified: Secondary | ICD-10-CM

## 2020-11-21 NOTE — Progress Notes (Signed)
Good morning, please let Ernest [name] know labs have returned: - A1C is 6.5%, continue daily Metformin - Kidney and liver function are normal - Your CBC shows mild reduction in hemoglobin, possibly some anemia presenting.  I recommend we recheck this in 4 weeks via outpatient labs to ensure no downward trend and check iron level too.   - Prostate lab normal. - Cholesterol labs show LDL (bad cholesterol level) at goal, continue statin. - Thyroid lab was mildly elevated, will recheck on outpatient labs in 4 weeks.  Please schedule lab visit for 4 weeks and also ensure to schedule your 3 month follow-up with the new PCP in office.  Any questions? Keep being awesome!!  Thank you for allowing me to participate in your care. Kindest regards, Tahjai Schetter

## 2020-11-21 NOTE — Progress Notes (Signed)
Future labs to recheck thyroid and anemia.

## 2020-11-30 ENCOUNTER — Other Ambulatory Visit: Payer: Self-pay | Admitting: Family Medicine

## 2020-11-30 ENCOUNTER — Other Ambulatory Visit: Payer: Self-pay | Admitting: Nurse Practitioner

## 2020-11-30 DIAGNOSIS — E1122 Type 2 diabetes mellitus with diabetic chronic kidney disease: Secondary | ICD-10-CM

## 2020-11-30 DIAGNOSIS — I10 Essential (primary) hypertension: Secondary | ICD-10-CM

## 2020-11-30 DIAGNOSIS — I129 Hypertensive chronic kidney disease with stage 1 through stage 4 chronic kidney disease, or unspecified chronic kidney disease: Secondary | ICD-10-CM

## 2020-12-01 ENCOUNTER — Other Ambulatory Visit: Payer: Self-pay | Admitting: Family Medicine

## 2020-12-01 NOTE — Telephone Encounter (Signed)
Current lab reviewed by provider- advised continue statin. RF per protocol

## 2020-12-20 ENCOUNTER — Other Ambulatory Visit: Payer: Self-pay

## 2020-12-20 ENCOUNTER — Other Ambulatory Visit: Payer: Medicare HMO

## 2020-12-20 DIAGNOSIS — D519 Vitamin B12 deficiency anemia, unspecified: Secondary | ICD-10-CM | POA: Diagnosis not present

## 2020-12-20 DIAGNOSIS — R7989 Other specified abnormal findings of blood chemistry: Secondary | ICD-10-CM | POA: Diagnosis not present

## 2020-12-20 DIAGNOSIS — D649 Anemia, unspecified: Secondary | ICD-10-CM

## 2020-12-21 ENCOUNTER — Encounter: Payer: Self-pay | Admitting: Nurse Practitioner

## 2020-12-21 ENCOUNTER — Telehealth: Payer: Self-pay | Admitting: Family Medicine

## 2020-12-21 DIAGNOSIS — E538 Deficiency of other specified B group vitamins: Secondary | ICD-10-CM | POA: Insufficient documentation

## 2020-12-21 LAB — CBC WITH DIFFERENTIAL/PLATELET
Basophils Absolute: 0 10*3/uL (ref 0.0–0.2)
Basos: 1 %
EOS (ABSOLUTE): 0.2 10*3/uL (ref 0.0–0.4)
Eos: 2 %
Hematocrit: 42.8 % (ref 37.5–51.0)
Hemoglobin: 13.3 g/dL (ref 13.0–17.7)
Immature Grans (Abs): 0 10*3/uL (ref 0.0–0.1)
Immature Granulocytes: 0 %
Lymphocytes Absolute: 2.7 10*3/uL (ref 0.7–3.1)
Lymphs: 42 %
MCH: 24.7 pg — ABNORMAL LOW (ref 26.6–33.0)
MCHC: 31.1 g/dL — ABNORMAL LOW (ref 31.5–35.7)
MCV: 80 fL (ref 79–97)
Monocytes Absolute: 0.4 10*3/uL (ref 0.1–0.9)
Monocytes: 6 %
Neutrophils Absolute: 3.3 10*3/uL (ref 1.4–7.0)
Neutrophils: 49 %
Platelets: 196 10*3/uL (ref 150–450)
RBC: 5.38 x10E6/uL (ref 4.14–5.80)
RDW: 15.1 % (ref 11.6–15.4)
WBC: 6.6 10*3/uL (ref 3.4–10.8)

## 2020-12-21 LAB — IRON,TIBC AND FERRITIN PANEL
Ferritin: 35 ng/mL (ref 30–400)
Iron Saturation: 12 % — ABNORMAL LOW (ref 15–55)
Iron: 57 ug/dL (ref 38–169)
Total Iron Binding Capacity: 487 ug/dL — ABNORMAL HIGH (ref 250–450)
UIBC: 430 ug/dL — ABNORMAL HIGH (ref 111–343)

## 2020-12-21 LAB — T4, FREE: Free T4: 1.36 ng/dL (ref 0.82–1.77)

## 2020-12-21 LAB — VITAMIN B12: Vitamin B-12: 352 pg/mL (ref 232–1245)

## 2020-12-21 LAB — TSH: TSH: 2.9 u[IU]/mL (ref 0.450–4.500)

## 2020-12-21 NOTE — Progress Notes (Signed)
Please let Ernest [name] know his labs have returned, thyroid labs remain stable and we do not need to start medication.  Hemoglobin has improved on CBC, showing some improved anemia.  However, B12 level remains on low side of normal and since he is on long term Metformin I do recommend he start taking Vitamin B12 1000 MCG daily.  This is important for nervous system health and can even help with neuropathy pain.  If any questions let me know.  Have a great day!! Keep being awesome!!  Thank you for allowing me to participate in your care. Kindest regards, Raydel Hosick

## 2021-01-03 ENCOUNTER — Other Ambulatory Visit: Payer: Self-pay | Admitting: Family Medicine

## 2021-01-03 DIAGNOSIS — I482 Chronic atrial fibrillation, unspecified: Secondary | ICD-10-CM

## 2021-01-04 ENCOUNTER — Other Ambulatory Visit: Payer: Self-pay | Admitting: Family Medicine

## 2021-01-04 DIAGNOSIS — I482 Chronic atrial fibrillation, unspecified: Secondary | ICD-10-CM

## 2021-01-05 ENCOUNTER — Other Ambulatory Visit: Payer: Self-pay | Admitting: General Practice

## 2021-01-05 DIAGNOSIS — I482 Chronic atrial fibrillation, unspecified: Secondary | ICD-10-CM

## 2021-01-05 DIAGNOSIS — G2581 Restless legs syndrome: Secondary | ICD-10-CM

## 2021-01-05 MED ORDER — GABAPENTIN 600 MG PO TABS: ORAL_TABLET | ORAL | 0 refills | Status: DC

## 2021-01-05 NOTE — Telephone Encounter (Signed)
Copied from [place] [id number]. Topic: Quick Communication - Rx Refill/Question >> Jan 05, 2021  2:15 PM Tessa Lerner A wrote: Medication: XARELTO 20 MG - patient has one(1) left gabapentin (NEURONTIN) 600 MG - patient has one(1) left  Has the patient contacted their pharmacy? Yes - the pharmacy directed patient to contact PCP   Preferred Pharmacy (with phone number or street name): [facility] [place], [place] Phone: [phone]  Agent: Please be advised that RX refills may take up to 3 business days. We ask that you follow-up with your pharmacy.

## 2021-01-06 ENCOUNTER — Encounter: Payer: Self-pay | Admitting: Family Medicine

## 2021-01-06 ENCOUNTER — Telehealth: Payer: Self-pay | Admitting: Family Medicine

## 2021-01-06 ENCOUNTER — Other Ambulatory Visit: Payer: Self-pay

## 2021-01-06 ENCOUNTER — Ambulatory Visit (INDEPENDENT_AMBULATORY_CARE_PROVIDER_SITE_OTHER): Payer: Medicare PPO | Admitting: Family Medicine

## 2021-01-06 VITALS — BP 155/94 | HR 68 | Temp 97.8°F | Wt 260.0 lb

## 2021-01-06 DIAGNOSIS — Z538 Procedure and treatment not carried out for other reasons: Secondary | ICD-10-CM

## 2021-01-06 DIAGNOSIS — I482 Chronic atrial fibrillation, unspecified: Secondary | ICD-10-CM

## 2021-01-06 NOTE — Telephone Encounter (Signed)
Says his xarelto was >$400. Will refer to pharmacy. Discussed that he cannot come off that medicine.

## 2021-01-06 NOTE — Progress Notes (Signed)
Patient did not need appointment. Appointment cancelled.

## 2021-01-07 ENCOUNTER — Telehealth: Payer: Self-pay | Admitting: Pharmacist

## 2021-01-07 ENCOUNTER — Telehealth: Payer: Self-pay | Admitting: *Deleted

## 2021-01-07 NOTE — Progress Notes (Addendum)
Chronic Care Management Pharmacy Assistant   Name: Ernest [name]  MRN: [id number] DOB: 06-16-1951  Reason for Encounter: Patient Assistance Applications.  PCP : Guadalupe Maple, MD  Allergies:   Allergies  Allergen Reactions   Doxycycline Other (See Comments)    Mouth sores     Medications: Outpatient Encounter Medications as of 01/07/2021  Medication Sig Note   ACCU-CHEK GUIDE test strip     albuterol (VENTOLIN HFA) 108 (90 Base) MCG/ACT inhaler INHALE 2 PUFFS INTO THE LUNGS EVERY 4 HOURS AS NEEDED FOR WHEEZING OR SHORTNESS OF BREATH    atorvastatin (LIPITOR) 80 MG tablet TAKE 1 TABLET(80 MG) BY MOUTH DAILY    budesonide-formoterol (SYMBICORT) 160-4.5 MCG/ACT inhaler Inhale 2 puffs into the lungs 2 (two) times daily. 02/24/2020: PRN    canagliflozin (INVOKANA) 300 MG TABS tablet Take 1 tablet (300 mg total) by mouth daily before breakfast. (Patient not taking: Reported on 01/06/2021)    clopidogrel (PLAVIX) 75 MG tablet TAKE 1 TABLET(75 MG) BY MOUTH DAILY WITH BREAKFAST    clotrimazole-betamethasone (LOTRISONE) cream Apply 1 application topically 2 (two) times daily.    diltiazem (CARDIZEM CD) 180 MG 24 hr capsule Take 1 capsule (180 mg total) by mouth daily.    furosemide (LASIX) 40 MG tablet Take 0.5 tablets (20 mg total) by mouth 2 (two) times daily.    gabapentin (NEURONTIN) 600 MG tablet TAKE 2 TABLETS(1200 MG) BY MOUTH AT BEDTIME    glipiZIDE (GLUCOTROL) 5 MG tablet TAKE 1 TABLET BY MOUTH DAILY BEFORE AND BREAKFAST    isosorbide mononitrate (IMDUR) 60 MG 24 hr tablet TAKE 1 TABLET(60 MG) BY MOUTH DAILY    metFORMIN (GLUCOPHAGE-XR) 500 MG 24 hr tablet TAKE 2 TABLETS(1000 MG) BY MOUTH TWICE DAILY    metoprolol succinate (TOPROL-XL) 25 MG 24 hr tablet TAKE 1 TABLET(25 MG) BY MOUTH DAILY    pantoprazole (PROTONIX) 40 MG tablet TAKE 1 TABLET(40 MG) BY MOUTH DAILY    ramipril (ALTACE) 10 MG capsule TAKE 1 CAPSULE(10 MG) BY MOUTH DAILY    XARELTO 20 MG TABS tablet TAKE 1  TABLET(20 MG) BY MOUTH DAILY WITH SUPPER    No facility-administered encounter medications on file as of 01/07/2021.    Current Diagnosis: Patient Active Problem List   Diagnosis Date Noted   B12 deficiency 12/21/2020   Elevated TSH 11/21/2020   Type 2 diabetes mellitus with morbid obesity ([place]) 11/19/2020   Morbid (severe) obesity due to excess calories ([place]) 04/12/2020   ACS (acute coronary syndrome) ([place]) 03/06/2019   COPD (chronic obstructive pulmonary disease) with emphysema ([place]) 10/10/2018   Angiodysplasia of stomach and duodenum    Angiodysplasia of intestinal tract    Congenital gastrointestinal vessel anomaly    Columnar epithelial-lined lower esophagus    Gastric polyp    Benign neoplasm of transverse colon    Intestinal lump    Iron deficiency anemia due to chronic blood loss 04/25/2018   Advanced care planning/counseling discussion 03/13/2018   Calcification of abdominal aorta ([place]) 02/07/2017   GERD (gastroesophageal reflux disease) 11/25/2015   Restless legs 11/25/2015   BPH (benign prostatic hyperplasia) 11/25/2015   Hypertension associated with diabetes ([place]) 07/06/2015   OSA on CPAP 08/25/2014   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 He explained he had one pill left and when he attempted to pick up a refill his co-pay was going to be $300.00 which he was unable to afford. PharmD Birdena Crandall requested assistance with this requested urgent need.   Called the patient's insurance provider [employer]. Applied for a medication exception but was denied due to the medication not having a generic form. Applied for an urgent appeal and was informed the decision could  take between 48 to 72 hours before a decision would be made. Again explained the urgent need and was informed they do not provide any type of emergency assistance. I provided the call center representative with the [facility] fax number. Was informed the decision would be faxed to the office. The reference number for urgent appeal is [id number].   Spoke with Wynetta Emery and Wynetta Emery patient assistance call center to enquire if they provide emergency fill vouchers for patients. The call center representative explained they do not provide emergency fill vouchers for the requested medication due to the medication not being "an urgent type of medication.". She continued to explain that after they receive the patient's application for assistance it would take 3-5 business days before a decision would be made.   Verbally made PharmD Birdena Crandall aware of the above. Patient assistance applications were prefilled for Xarelto, Invokana, and Symbicort.   Cloretta Ned, LPN Clinical Pharmacist Assistant  [phone]    Follow-Up: Pharmacist Review  I have reviewed the care management and care coordination activities outlined in this encounter and I am certifying that I agree with the content of this note.  Junita Push. Kenton Kingfisher PharmD, [facility] [phone]

## 2021-01-07 NOTE — Chronic Care Management (AMB) (Signed)
  Chronic Care Management   Note  01/07/2021 Name: Ernest [name] MRN: [id number] DOB: 10/22/1951  Ernest [name] is a 70 y.o. year old male who is a primary care patient of Crissman, Jeannette How, MD. I reached out to [name] by phone today in response to a referral sent by Ernest [name]'s PCP,Crissman, Jeannette How, MD.  Ernest [name] was given information about Chronic Care Management services today including:  1. CCM service includes personalized support from designated clinical staff supervised by his physician, including individualized plan of care and coordination with other care providers 2. 24/7 contact phone numbers for assistance for urgent and routine care needs. 3. Service will only be billed when office clinical staff spend 20 minutes or more in a month to coordinate care. 4. Only one practitioner may furnish and bill the service in a calendar month. 5. The patient may stop CCM services at any time (effective at the end of the month) by phone call to the office staff. 6. The patient will be responsible for cost sharing (co-pay) of up to 20% of the service fee (after annual deductible is met).  Patient agreed to services and verbal consent obtained.   Follow up plan: Telephone appointment with care management team member scheduled for:01/17/2021  [facility]

## 2021-01-11 ENCOUNTER — Telehealth: Payer: Self-pay | Admitting: Pharmacist

## 2021-01-14 NOTE — Telephone Encounter (Signed)
error 

## 2021-01-17 ENCOUNTER — Telehealth: Payer: Medicare PPO

## 2021-01-17 NOTE — Chronic Care Management (AMB) (Deleted)
Chronic Care Management Pharmacy  Name: Ernest [name]  MRN: [id number] DOB: 09/25/1951   Chief Complaint/ HPI  Ernest [name],  70 y.o. , male presents for his Initial CCM visit with the clinical pharmacist via telephone.  PCP : Guadalupe Maple, MD Patient Care Team: Guadalupe Maple, MD as PCP - General (Family Medicine) Minna Merritts, MD as PCP - Cardiology (Cardiology) Vanita Ingles, RN as Case Manager ([place]) Vladimir Faster, [facility] (Pharmacist)  Patient's chronic conditions include: Hypertension, Hyperlipidemia, Diabetes, Atrial Fibrillation, Coronary Artery Disease, GERD, COPD and BPH   Office Visits: 11/19/20- Marnee Guarneri, DNP- blood work, flu vaccine  Consult Visit: ***   Subjective: ***  Objective: Allergies  Allergen Reactions  . Doxycycline Other (See Comments)    Mouth sores     Medications: Outpatient Encounter Medications as of 01/17/2021  Medication Sig Note  . ACCU-CHEK GUIDE test strip    . albuterol (VENTOLIN HFA) 108 (90 Base) MCG/ACT inhaler INHALE 2 PUFFS INTO THE LUNGS EVERY 4 HOURS AS NEEDED FOR WHEEZING OR SHORTNESS OF BREATH   . atorvastatin (LIPITOR) 80 MG tablet TAKE 1 TABLET(80 MG) BY MOUTH DAILY   . budesonide-formoterol (SYMBICORT) 160-4.5 MCG/ACT inhaler Inhale 2 puffs into the lungs 2 (two) times daily. 02/24/2020: PRN   . canagliflozin (INVOKANA) 300 MG TABS tablet Take 1 tablet (300 mg total) by mouth daily before breakfast. (Patient not taking: Reported on 01/06/2021)   . clopidogrel (PLAVIX) 75 MG tablet TAKE 1 TABLET(75 MG) BY MOUTH DAILY WITH BREAKFAST   . clotrimazole-betamethasone (LOTRISONE) cream Apply 1 application topically 2 (two) times daily.   Marland Kitchen diltiazem (CARDIZEM CD) 180 MG 24 hr capsule Take 1 capsule (180 mg total) by mouth daily.   . furosemide (LASIX) 40 MG tablet Take 0.5 tablets (20 mg total) by mouth 2 (two) times daily.   Marland Kitchen gabapentin (NEURONTIN) 600 MG tablet TAKE 2 TABLETS(1200 MG) BY  MOUTH AT BEDTIME   . glipiZIDE (GLUCOTROL) 5 MG tablet TAKE 1 TABLET BY MOUTH DAILY BEFORE AND BREAKFAST   . isosorbide mononitrate (IMDUR) 60 MG 24 hr tablet TAKE 1 TABLET(60 MG) BY MOUTH DAILY   . metFORMIN (GLUCOPHAGE-XR) 500 MG 24 hr tablet TAKE 2 TABLETS(1000 MG) BY MOUTH TWICE DAILY   . metoprolol succinate (TOPROL-XL) 25 MG 24 hr tablet TAKE 1 TABLET(25 MG) BY MOUTH DAILY   . pantoprazole (PROTONIX) 40 MG tablet TAKE 1 TABLET(40 MG) BY MOUTH DAILY   . ramipril (ALTACE) 10 MG capsule TAKE 1 CAPSULE(10 MG) BY MOUTH DAILY   . XARELTO 20 MG TABS tablet TAKE 1 TABLET(20 MG) BY MOUTH DAILY WITH SUPPER    No facility-administered encounter medications on file as of 01/17/2021.    Wt Readings from Last 3 Encounters:  01/06/21 260 lb (117.9 kg)  11/19/20 263 lb 3.2 oz (119.4 kg)  04/12/20 235 lb 12.8 oz (107 kg)    Lab Results  Component Value Date   CREATININE 1.07 11/19/2020   BUN 13 11/19/2020   GFRNONAA 70 11/19/2020   GFRAA 81 11/19/2020   NA 143 11/19/2020   K 4.5 11/19/2020   CALCIUM 9.2 11/19/2020   CO2 24 11/19/2020     Current Diagnosis/Assessment:    Goals Addressed   None    Diabetes/ B12 Deficiency   A1c goal <7%  Recent Relevant Labs: Lab Results  Component Value Date/Time   HGBA1C 6.5 (H) 11/19/2020 11:09 AM   HGBA1C 5.8 (H) 04/12/2020 04:39 PM  HGBA1C >14.0 (H) 10/20/2019 01:41 PM   HGBA1C 6.8 06/02/2019 09:29 AM   HGBA1C 7.5 (H) 07/25/2014 04:25 AM   MICROALBUR 30 (H) 04/12/2020 09:55 AM   MICROALBUR 80 (H) 09/20/2016 10:08 AM   Vitamin B12   352  12/20/20  Last diabetic Eye exam:  Lab Results  Component Value Date/Time   HMDIABEYEEXA No Retinopathy 04/19/2018 12:00 AM    Last diabetic Foot exam: No results found for: HMDIABFOOTEX  Overdue for DM eye exam --encouraged schedule Checking BG: {CHL HP Blood Glucose Monitoring Frequency:[phone]}  Recent FBG Readings: *** Recent pre-meal BG readings: *** Recent 2hr PP BG readings:   *** Recent HS BG readings: ***  Patient has failed these meds in past: *** Patient is currently controlled on the following medications: . Canagliflozin ( Invokana) 300  Mg qd . Metformin 1000 mg (524m x2) bid . Vitamin b12 1000 mcg qd . Glipizide 5 mg qd  We discussed: Signs and symptoms of hypoglycemia and how to treat. Copay of Invokana is cost prohibitive and patient assistance application mailed to patient on 01/11/21. He *** received and completed will **. We discussed diet and exercise. Patient overdue for eye exam.  Plan  Continue {CHL HP Upstream Pharmacy Plans:[phone]} AFIB   Patient is currently {CHL HP BP RATE/[phone]} controlled. Office heart rates are  Pulse Readings from Last 3 Encounters:  01/06/21 68  11/19/20 92  04/12/20 74    CHA2DS2-VASc Score =    The patient's score is based upon:   {Click here to calculate score.  REFRESH note before signing.       :[id number]}   ASSESSMENT AND PLAN: {Select the correct AFib Diagnosis                 :[phone]}  Patient has failed these meds in past: NA Patient is currently controlled on the following medications:  . Xarelto 20 mg qd . Clopidogrel 75 mg qd . Toprol XL 25 mg qd  We discussed:  Patient Xarelto copay unaffordable with new plan year. Patient assistance applications mailed 13/27/61 Patient currently using Eliquis sample and ***tolerating well. Reviewed signs/symptoms of bleeding. Overdue for cardiology appointment patient **schedule  Plan  Continue {CHL HP Upstream Pharmacy Plans:[phone]}      Hypertension   BP goal is:  <130/80  Office blood pressures are  BP Readings from Last 3 Encounters:  01/06/21 (!) 155/94  11/19/20 138/70  04/12/20 132/74   Patient checks BP at home {CHL HP BP Monitoring Frequency:[phone]} Patient home BP readings are ranging: ***  Patient has failed these meds in the past: *** Patient is currently {CHL Controlled/Uncontrolled:[phone]}  on the following medications:  . Ramipril 10 mg qd . Furosemide 40 mg qd . toprol xl 25 mg qd  We discussed {CHL HP Upstream Pharmacy discussion:[phone]}  Plan  Continue {CHL HP Upstream Pharmacy Plans:[phone]}     Hyperlipidemia/ CAD with angina /ACS   LDL goal < 70  Last lipids Lab Results  Component Value Date   CHOL 98 (L) 11/19/2020   HDL 31 (L) 11/19/2020   LDLCALC 38 11/19/2020   TRIG 176 (H) 11/19/2020   CHOLHDL 3.7 01/30/2017   Hepatic Function Latest Ref Rng & Units 11/19/2020 04/12/2020 11/21/2019  Total Protein 6.0 - 8.5 g/dL 6.5 6.4 6.7  Albumin 3.8 - 4.8 g/dL 4.3 4.4 4.8  AST 0 - 40 IU/L 17 15 22   ALT 0 - 44 IU/L 13 14 20   Alk Phosphatase 44 - 121  IU/L 97 75 84  Total Bilirubin 0.0 - 1.2 mg/dL 1.0 0.8 1.2     The ASCVD Risk score Mikey Bussing DC Jr., et al., 2013) failed to calculate for the following reasons:   The patient has a prior MI or stroke diagnosis   Patient has failed these meds in past: NA Patient is currently controlled on the following medications:  . Atorvastatin 80 mg qd . Isosorbide mononitrate 60 mg qd .   We discussed:  {CHL HP Upstream Pharmacy discussion:[phone]}  Plan  Continue {CHL HP Upstream Pharmacy [phone]}   COPD / Asthma / Tobacco   Last spirometry score: 2015  Gold Grade: {CHL HP Upstream Pharm COPD Gold YLTEI:[id number]} Current COPD Classification:  {CHL HP Upstream Pharm COPD Classification:[phone]}  CAT screening score:  Eosinophil count:   Lab Results  Component Value Date/Time   EOSPCT 2 01/07/2019 02:55 PM   EOSPCT 1.6 07/27/2014 05:02 AM  %                               Eos (Absolute):  Lab Results  Component Value Date/Time   EOSABS 0.2 12/20/2020 11:32 AM   EOSABS 0.2 07/27/2014 05:02 AM    Tobacco Status:  Social History   Tobacco Use  Smoking Status Former Smoker  . Packs/day: 4.00  . Years: 28.00  . Pack years: 112.00  . Types: Cigarettes  . Quit date: 08/25/1993  . Years  since quitting: 27.4  Smokeless Tobacco Never Used    Patient has failed these meds in past: NA Patient is currently {CHL Controlled/Uncontrolled:[phone]} on the following medications:   Symbicort 2 puffs bid  Albuterol 2 puffs q4h prn Using maintenance inhaler regularly? {yes/no:20286} Frequency of rescue inhaler use:  {CHL HP Upstream Pharm Inhaler MMIT:[phone]}  We discussed:  proper inhaler technique. Copay of Symbicort is ?Marland Kitchen Patient is/is not using his CPAP.  Plan  Continue {CHL HP Upstream Pharmacy JWTGR:[id number]}  Vaccines   Reviewed and discussed patient's vaccination history.    Immunization History  Administered Date(s) Administered  . Fluad Quad(high Dose 65+) 11/21/2019, 11/19/2020  . Influenza Split 08/25/2013  . Influenza, High Dose Seasonal PF 09/20/2016, 11/02/2017, 10/10/2018  . Influenza,inj,Quad PF,6+ Mos 11/17/2015  . Influenza-Unspecified 12/23/2014  . Pneumococcal Conjugate-13 11/25/2015  . Pneumococcal Polysaccharide-23 10/25/2013, 02/03/2019  . Pneumococcal-Unspecified 10/26/2009  . Tdap 11/06/2012  . Zoster 02/04/2013    Plan  Recommended patient receive *** vaccine in *** office.    Medication Management   Patient's preferred pharmacy is:  [id number] [address], [address] [name] [address] Phone: [phone] Fax: [phone]  [place], [place], [address], [address] [place] [address] Phone: [phone] Fax: [phone]  Uses pill box? {Yes or If no, why not?:20788} Pt endorses ***% compliance  We discussed: {Pharmacy options:24294}  Plan  {US Pharmacy VPLW:85992}    Follow up: *** month phone visit  ***

## 2021-01-17 NOTE — Progress Notes (Signed)
Chronic Care Management Pharmacy Assistant   Name: Ernest [name]  MRN: [id number] DOB: 1951/09/20  Reason for Encounter: Patient Assistance Applications/ Xarelto, Invokana, and Symbicort.    PCP : Guadalupe Maple, MD  Allergies:   Allergies  Allergen Reactions  . Doxycycline Other (See Comments)    Mouth sores     Medications: Outpatient Encounter Medications as of 01/11/2021  Medication Sig Note  . ACCU-CHEK GUIDE test strip    . albuterol (VENTOLIN HFA) 108 (90 Base) MCG/ACT inhaler INHALE 2 PUFFS INTO THE LUNGS EVERY 4 HOURS AS NEEDED FOR WHEEZING OR SHORTNESS OF BREATH   . atorvastatin (LIPITOR) 80 MG tablet TAKE 1 TABLET(80 MG) BY MOUTH DAILY   . budesonide-formoterol (SYMBICORT) 160-4.5 MCG/ACT inhaler Inhale 2 puffs into the lungs 2 (two) times daily. 02/24/2020: PRN   . canagliflozin (INVOKANA) 300 MG TABS tablet Take 1 tablet (300 mg total) by mouth daily before breakfast. (Patient not taking: Reported on 01/06/2021)   . clopidogrel (PLAVIX) 75 MG tablet TAKE 1 TABLET(75 MG) BY MOUTH DAILY WITH BREAKFAST   . clotrimazole-betamethasone (LOTRISONE) cream Apply 1 application topically 2 (two) times daily.   . furosemide (LASIX) 40 MG tablet Take 0.5 tablets (20 mg total) by mouth 2 (two) times daily.   Marland Kitchen gabapentin (NEURONTIN) 600 MG tablet TAKE 2 TABLETS(1200 MG) BY MOUTH AT BEDTIME   . glipiZIDE (GLUCOTROL) 5 MG tablet TAKE 1 TABLET BY MOUTH DAILY BEFORE AND BREAKFAST   . isosorbide mononitrate (IMDUR) 60 MG 24 hr tablet TAKE 1 TABLET(60 MG) BY MOUTH DAILY   . metFORMIN (GLUCOPHAGE-XR) 500 MG 24 hr tablet TAKE 2 TABLETS(1000 MG) BY MOUTH TWICE DAILY   . metoprolol succinate (TOPROL-XL) 25 MG 24 hr tablet TAKE 1 TABLET(25 MG) BY MOUTH DAILY   . pantoprazole (PROTONIX) 40 MG tablet TAKE 1 TABLET(40 MG) BY MOUTH DAILY   . ramipril (ALTACE) 10 MG capsule TAKE 1 CAPSULE(10 MG) BY MOUTH DAILY   . XARELTO 20 MG TABS tablet TAKE 1 TABLET(20 MG) BY MOUTH DAILY WITH SUPPER     No facility-administered encounter medications on file as of 01/11/2021.    Current Diagnosis: Patient Active Problem List   Diagnosis Date Noted  . B12 deficiency 12/21/2020  . Elevated TSH 11/21/2020  . Type 2 diabetes mellitus with morbid obesity ([place]) 11/19/2020  . Morbid (severe) obesity due to excess calories ([place]) 04/12/2020  . ACS (acute coronary syndrome) ([place]) 03/06/2019  . COPD (chronic obstructive pulmonary disease) with emphysema ([place]) 10/10/2018  . Angiodysplasia of stomach and duodenum   . Angiodysplasia of intestinal tract   . Congenital gastrointestinal vessel anomaly   . Columnar epithelial-lined lower esophagus   . Gastric polyp   . Benign neoplasm of transverse colon   . Intestinal lump   . Iron deficiency anemia due to chronic blood loss 04/25/2018  . Advanced care planning/counseling discussion 03/13/2018  . Calcification of abdominal aorta (HCC) 02/07/2017  . GERD (gastroesophageal reflux disease) 11/25/2015  . Restless legs 11/25/2015  . BPH (benign prostatic hyperplasia) 11/25/2015  . Hypertension associated with diabetes ([place]) 07/06/2015  . OSA on CPAP 08/25/2014  . 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 Hyperlipidemia associated with type 2 diabetes mellitus ([place]) 06/03/2010  . Atrial fibrillation [facility]) 06/03/2010    01-11-2021: Patient assistance application printed for Xarelto, Invokana, and Symbicort. Highlighted and flagged sections for the patient to complete. Also provided a typed instructions of  what the patients needs to provide and complete so the application can be processed. Mailed the applications.   Cloretta Ned, LPN Clinical Pharmacist Assistant  [phone]    Follow-Up:  Will follow up with the patient the following week.

## 2021-01-25 ENCOUNTER — Telehealth: Payer: Self-pay

## 2021-01-25 NOTE — Telephone Encounter (Signed)
Pt came to drop forms for [facility] forms left in bin

## 2021-01-27 ENCOUNTER — Telehealth: Payer: Self-pay | Admitting: Cardiovascular Disease

## 2021-01-27 NOTE — Telephone Encounter (Signed)
3 attempts to schedule fu appt from recall list.   Deleting recall.   

## 2021-01-29 ENCOUNTER — Other Ambulatory Visit: Payer: Self-pay | Admitting: Family Medicine

## 2021-02-07 ENCOUNTER — Telehealth: Payer: Medicare PPO

## 2021-02-07 NOTE — Progress Notes (Deleted)
Chronic Care Management Pharmacy Note  02/07/2021 Name:  Ernest [name] MRN:  [id number] DOB:  [birth year]  Subjective: Ernest [name] is an 70 y.o. year old male who is a primary patient of [name], [name], MD.  The CCM team was consulted for assistance with disease management and care coordination needs.    {CCMTELEPHONEFACETOFACE:21091510} for {CCMINITIALFOLLOWUPCHOICE:21091511} in response to provider referral for pharmacy case management and/or care coordination services.   Consent to Services:  {CCMCONSENTOPTIONS:25074}  Patient Care Team: [name], MD as PCP - General Rockey Situ, Kathlene November, MD as PCP - Cardiology (Cardiology) Vanita Ingles, RN as Case Manager ([place]) Vladimir Faster, [facility] (Pharmacist)  Recent office visits: ***  Recent consult visits: [facility] visits: {Hospital DC Yes/No:25215}  Objective:  Lab Results  Component Value Date   CREATININE 1.07 11/19/2020   BUN 13 11/19/2020   GFRNONAA 70 11/19/2020   GFRAA 81 11/19/2020   NA 143 11/19/2020   K 4.5 11/19/2020   CALCIUM 9.2 11/19/2020   CO2 24 11/19/2020    Lab Results  Component Value Date/Time   HGBA1C 6.5 (H) 11/19/2020 11:09 AM   HGBA1C 5.8 (H) 04/12/2020 04:39 PM   HGBA1C >14.0 (H) 10/20/2019 01:41 PM   HGBA1C 6.8 06/02/2019 09:29 AM   HGBA1C 7.5 (H) 07/25/2014 04:25 AM   MICROALBUR 30 (H) 04/12/2020 09:55 AM   MICROALBUR 80 (H) 09/20/2016 10:08 AM    Last diabetic Eye exam:  Lab Results  Component Value Date/Time   HMDIABEYEEXA No Retinopathy 04/19/2018 12:00 AM    Last diabetic Foot exam: No results found for: HMDIABFOOTEX   Lab Results  Component Value Date   CHOL 98 (L) 11/19/2020   HDL 31 (L) 11/19/2020   LDLCALC 38 11/19/2020   TRIG 176 (H) 11/19/2020   CHOLHDL 3.7 01/30/2017    Hepatic Function Latest Ref Rng & Units 11/19/2020 04/12/2020 11/21/2019  Total Protein 6.0 - 8.5 g/dL 6.5 6.4 6.7  Albumin 3.8 - 4.8 g/dL 4.3 4.4 4.8  AST 0 - 40 IU/L  17 15 22   ALT 0 - 44 IU/L 13 14 20   Alk Phosphatase 44 - 121 IU/L 97 75 84  Total Bilirubin 0.0 - 1.2 mg/dL 1.0 0.8 1.2    Lab Results  Component Value Date/Time   TSH 2.900 12/20/2020 11:32 AM   TSH 4.910 (H) 11/19/2020 11:09 AM   FREET4 1.36 12/20/2020 11:32 AM    CBC Latest Ref Rng & Units 12/20/2020 11/19/2020 04/12/2020  WBC 3.4 - 10.8 x10E3/uL 6.6 7.5 6.4  Hemoglobin 13.0 - 17.7 g/dL 13.3 11.5(L) 14.3  Hematocrit 37.5 - 51.0 % 42.8 37.7 43.6  Platelets 150 - 450 x10E3/uL 196 172 168    No results found for: VD25OH  Clinical ASCVD: {YES/NO:21197} The ASCVD Risk score Mikey Bussing DC Jr., et al., 2013) failed to calculate for the following reasons:   The patient has a prior MI or stroke diagnosis    Depression screen [facility] 2/9 04/02/2020 02/09/2020 02/03/2019  Decreased Interest 0 0 0  Down, Depressed, Hopeless 0 0 1  PHQ - 2 Score 0 0 1     ***Other: (CHADS2VASc if Afib, MMRC or CAT for COPD, ACT, DEXA)  Social History   Tobacco Use  Smoking Status Former Smoker  . Packs/day: 4.00  . Years: 28.00  . Pack years: 112.00  . Types: Cigarettes  . Quit date: 08/25/1993  . Years since quitting: 27.4  Smokeless Tobacco Never Used   BP Readings from  Last 3 Encounters:  01/06/21 (!) 155/94  11/19/20 138/70  04/12/20 132/74   Pulse Readings from Last 3 Encounters:  01/06/21 68  11/19/20 92  04/12/20 74   Wt Readings from Last 3 Encounters:  01/06/21 260 lb (117.9 kg)  11/19/20 263 lb 3.2 oz (119.4 kg)  04/12/20 235 lb 12.8 oz (107 kg)    Assessment/Interventions: Review of patient past medical history, allergies, medications, health status, including review of consultants reports, laboratory and other test data, was performed as part of comprehensive evaluation and provision of chronic care management services.   SDOH:  (Social Determinants of Health) assessments and interventions performed: {yes/no:20286}   CCM Care Plan  Allergies  Allergen Reactions  . Doxycycline  Other (See Comments)    Mouth sores     Medications Reviewed Today    Reviewed by Valerie Roys, DO (Physician) on 01/06/21 at 1548  Med List Status: <None>  Medication Order Taking? Sig Documenting Provider Last Dose Status Informant  ACCU-CHEK GUIDE test strip [id number] Yes  [provider] Taking Active   albuterol (VENTOLIN HFA) 108 (90 Base) MCG/ACT inhaler [id number] Yes INHALE 2 PUFFS INTO THE LUNGS EVERY 4 HOURS AS NEEDED FOR WHEEZING OR SHORTNESS OF BREATH Johnson, Megan P, DO Taking Active   atorvastatin (LIPITOR) 80 MG tablet [id number] Yes TAKE 1 TABLET(80 MG) BY MOUTH DAILY Cannady, Jolene T, NP Taking Active   budesonide-formoterol (SYMBICORT) 160-4.5 MCG/ACT inhaler [id number] Yes Inhale 2 puffs into the lungs 2 (two) times daily. Volney American, [place] Taking Active Self           Med Note ([place] Feb 24, 2020  3:56 PM) PRN   canagliflozin (INVOKANA) 300 MG TABS tablet [id number] No Take 1 tablet (300 mg total) by mouth daily before breakfast.  Patient not taking: Reported on 01/06/2021   Valerie Roys, DO Not Taking Active   clopidogrel (PLAVIX) 75 MG tablet [id number] Yes TAKE 1 TABLET(75 MG) BY MOUTH DAILY WITH BREAKFAST Cannady, Barbaraann Faster, NP Taking Active   clotrimazole-betamethasone (LOTRISONE) cream [id number] Yes Apply 1 application topically 2 (two) times daily. Johnson, Megan P, DO Taking Active   diltiazem (CARDIZEM CD) 180 MG 24 hr capsule [id number]  Take 1 capsule (180 mg total) by mouth daily. Theora Gianotti, NP  Expired 05/07/20 2359   furosemide (LASIX) 40 MG tablet [id number] Yes Take 0.5 tablets (20 mg total) by mouth 2 (two) times daily. [place] Liter P, DO Taking Active   gabapentin (NEURONTIN) 600 MG tablet [id number] Yes TAKE 2 TABLETS(1200 MG) BY MOUTH AT BEDTIME Johnson, Megan P, DO Taking Active   glipiZIDE (GLUCOTROL) 5 MG tablet [id number] Yes TAKE 1 TABLET BY MOUTH DAILY BEFORE AND BREAKFAST Cannady, Jolene T, NP  Taking Active   isosorbide mononitrate (IMDUR) 60 MG 24 hr tablet [id number] Yes TAKE 1 TABLET(60 MG) BY MOUTH DAILY Gollan, Kathlene November, MD Taking Active   metFORMIN (GLUCOPHAGE-XR) 500 MG 24 hr tablet [id number] Yes TAKE 2 TABLETS(1000 MG) BY MOUTH TWICE DAILY Johnson, Megan P, DO Taking Active   metoprolol succinate (TOPROL-XL) 25 MG 24 hr tablet [id number] Yes TAKE 1 TABLET(25 MG) BY MOUTH DAILY Cannady, Jolene T, NP Taking Active   pantoprazole (PROTONIX) 40 MG tablet [id number] Yes TAKE 1 TABLET(40 MG) BY MOUTH DAILY Johnson, Megan P, DO Taking Active   ramipril (ALTACE) 10 MG capsule [id number] Yes TAKE 1 CAPSULE(10 MG) BY MOUTH DAILY Cannady, Jolene T, NP Taking Active   XARELTO 20  MG TABS tablet [id number] Yes TAKE 1 TABLET(20 MG) BY MOUTH DAILY WITH SUPPER Venita Lick, NP Taking Active           Patient Active Problem List   Diagnosis Date Noted  . B12 deficiency 12/21/2020  . Elevated TSH 11/21/2020  . Type 2 diabetes mellitus with morbid obesity ([place]) 11/19/2020  . Morbid (severe) obesity due to excess calories ([place]) 04/12/2020  . ACS (acute coronary syndrome) ([place]) 03/06/2019  . COPD (chronic obstructive pulmonary disease) with emphysema ([place]) 10/10/2018  . Angiodysplasia of stomach and duodenum   . Angiodysplasia of intestinal tract   . Congenital gastrointestinal vessel anomaly   . Columnar epithelial-lined lower esophagus   . Gastric polyp   . Benign neoplasm of transverse colon   . Intestinal lump   . Iron deficiency anemia due to chronic blood loss 04/25/2018  . Advanced care planning/counseling discussion 03/13/2018  . Calcification of abdominal aorta (HCC) 02/07/2017  . GERD (gastroesophageal reflux disease) 11/25/2015  . Restless legs 11/25/2015  . BPH (benign prostatic hyperplasia) 11/25/2015  . Hypertension associated with diabetes ([place]) 07/06/2015  . OSA on CPAP 08/25/2014  . 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 Hyperlipidemia associated with type 2 diabetes mellitus ([place]) 06/03/2010  . Atrial fibrillation ([place]) 06/03/2010    Immunization History  Administered Date(s) Administered  . Fluad Quad(high Dose 65+) 11/21/2019, 11/19/2020  . Influenza Split 08/25/2013  . Influenza, High Dose Seasonal PF 09/20/2016, 11/02/2017, 10/10/2018  . Influenza,inj,Quad PF,6+ Mos 11/17/2015  . Influenza-Unspecified 12/23/2014  . Pneumococcal Conjugate-13 11/25/2015  . Pneumococcal Polysaccharide-23 10/25/2013, 02/03/2019  . Pneumococcal-Unspecified 10/26/2009  . Tdap 11/06/2012  . Zoster 02/04/2013    Conditions to be addressed/monitored:  {USCCMDZASSESSMENTOPTIONS:23563}  There are no care plans that you recently modified to display for this patient.    Medication Assistance: {MEDASSISTANCEINFO:25044}  Patient's preferred pharmacy is:  [id number] [address], [address] [name] [address] Phone: [phone] Fax: [phone]  [place], [address], [address] [place], [address] [place] [address] Phone: [phone] Fax: [phone]  Uses pill box? {Yes or If no, why not?:20788} Pt endorses ***% compliance  We discussed: {Pharmacy options:24294} Patient decided to: {US Pharmacy Plan:23885}  Care Plan and Follow Up Patient Decision:  {FOLLOWUP:24991}  Plan: {CM FOLLOW UP PLAN:25073}  ***    Current Barriers:  . {pharmacybarriers:24917} . ***  Pharmacist Clinical Goal(s):  Marland Kitchen Over the next *** days, patient will {PHARMACYGOALCHOICES:24921} through collaboration with PharmD and provider.  . ***  Interventions: . 1:1 collaboration with [name], MD regarding development and update of comprehensive plan of care as evidenced by provider attestation and co-signature . Inter-disciplinary care team collaboration (see  longitudinal plan of care) . Comprehensive medication review performed; medication list updated in electronic medical record  Diabetes (A1c goal {A1c goals:23924}) -{CHL Controlled/Uncontrolled:[phone]} -Current medications: . *** -Medications previously tried: ***  -Current home glucose readings . fasting glucose: *** . post prandial glucose: *** -{ACTIONS;DENIES/REPORTS:21021675::"Denies"} hypoglycemic/hyperglycemic symptoms -Current meal patterns:  . breakfast: ***  . lunch: ***  . dinner: *** . snacks: *** . drinks: *** -Current exercise: *** -Educated on{CCM DM COUNSELING:25123} -Counseled to check feet daily and get yearly eye exams -{CCMPHARMDINTERVENTION:25122}  Atrial Fibrillation (Goal: prevent stroke and major bleeding) -{CHL Controlled/Uncontrolled:[phone]}  -  CHADSVASC: *** -Current treatment: . Rate control: *** . Anticoagulation: *** -Medications previously tried: *** -Home BP and HR readings: ***  -Counseled on {CCMAFIBCOUNSELING:25120} -{CCMPHARMDINTERVENTION:25122}  COPD (Goal: control symptoms and prevent exacerbations) -{CHL Controlled/Uncontrolled:[phone]} -Current treatment  . *** -Medications previously tried: ***  -Gold Grade: {CHL HP [facility] Pharm COPD Gold FEOFH:[id number]} -Current COPD Classification:  {CHL HP Upstream Pharm COPD Classification:[phone]} -MMRC/CAT score: *** -Pulmonary function testing: *** -Exacerbations requiring treatment in last 6 months: *** -Patient {Actions; denies-reports:120008} consistent use of maintenance inhaler -Frequency of rescue inhaler use: *** -Counseled on {CCMINHALERCOUNSELING:25121} -{CCMPHARMDINTERVENTION:25122}  *** (Goal: ***) -{CHL Controlled/Uncontrolled:[phone]} -Current treatment  . *** -Medications previously tried: ***  -{CCMPHARMDINTERVENTION:25122}   Patient Goals/Self-Care Activities . Over the next *** days, patient will:  - {pharmacypatientgoals:24919}  Follow Up  Plan: {CM FOLLOW UP QDIY:64158}

## 2021-02-08 ENCOUNTER — Telehealth: Payer: Self-pay | Admitting: Internal Medicine

## 2021-02-08 NOTE — Telephone Encounter (Signed)
Copied from [place] [id number]. Topic: Medicare AWV >> Feb 08, 2021  4:09 PM Cher Nakai R wrote: Reason for CRM:   LM to notify AWSV will be completed by phone not in office -srs

## 2021-02-14 ENCOUNTER — Ambulatory Visit (INDEPENDENT_AMBULATORY_CARE_PROVIDER_SITE_OTHER): Payer: Medicare HMO

## 2021-02-14 VITALS — Ht 70.0 in | Wt 260.0 lb

## 2021-02-14 DIAGNOSIS — Z0000 Encounter for general adult medical examination without abnormal findings: Secondary | ICD-10-CM | POA: Diagnosis not present

## 2021-02-14 NOTE — Progress Notes (Addendum)
I connected with Skeet Simmer today by telephone and verified that I am speaking with the correct person using two identifiers. Location patient: home Location provider: work Persons participating in the virtual visit: Zaydon Doetsch, Travarus Cerros LPN.   I discussed the limitations, risks, security and privacy concerns of performing an evaluation and management service by telephone and the availability of in person appointments. I also discussed with the patient that there may be a patient responsible charge related to this service. The patient expressed understanding and verbally consented to this telephonic visit.    Interactive audio and video telecommunications were attempted between this provider and patient, however failed, due to patient having technical difficulties OR patient did not have access to video capability.  We continued and completed visit with audio only.     Vital signs may be patient reported or missing.  Subjective:   Ernest [name] is a 70 y.o. male who presents for Medicare Annual/Subsequent preventive examination.  Review of Systems     Cardiac Risk Factors include: advanced age (>53men, >86 women);diabetes mellitus;dyslipidemia;hypertension;male gender;obesity (BMI >30kg/m2);sedentary lifestyle     Objective:    Today's Vitals   02/14/21 0811  Weight: 260 lb (117.9 kg)  Height: 5\' 10"  (1.778 m)   Body mass index is 37.31 kg/m.  Advanced Directives 02/14/2021 04/02/2020 02/09/2020 03/06/2019 03/06/2019 02/03/2019 01/07/2019  Does Patient Have a Medical Advance Directive? No No No No No No No  Would patient like information on creating a medical advance directive? - Yes (MAU/Ambulatory/Procedural Areas - Information given) - No - Patient declined No - Patient declined Yes (MAU/Ambulatory/Procedural Areas - Information given) Yes (MAU/Ambulatory/Procedural Areas - Information given)    Current Medications (verified) Outpatient Encounter Medications as  of 02/14/2021  Medication Sig  . ACCU-CHEK GUIDE test strip   . albuterol (VENTOLIN HFA) 108 (90 Base) MCG/ACT inhaler INHALE 2 PUFFS INTO THE LUNGS EVERY 4 HOURS AS NEEDED FOR WHEEZING OR SHORTNESS OF BREATH  . atorvastatin (LIPITOR) 80 MG tablet TAKE 1 TABLET(80 MG) BY MOUTH DAILY  . budesonide-formoterol (SYMBICORT) 160-4.5 MCG/ACT inhaler Inhale 2 puffs into the lungs 2 (two) times daily.  . clopidogrel (PLAVIX) 75 MG tablet TAKE 1 TABLET(75 MG) BY MOUTH DAILY WITH BREAKFAST  . clotrimazole-betamethasone (LOTRISONE) cream Apply 1 application topically 2 (two) times daily.  . furosemide (LASIX) 40 MG tablet Take 0.5 tablets (20 mg total) by mouth 2 (two) times daily.  Marland Kitchen gabapentin (NEURONTIN) 600 MG tablet TAKE 2 TABLETS(1200 MG) BY MOUTH AT BEDTIME  . glipiZIDE (GLUCOTROL) 5 MG tablet TAKE 1 TABLET BY MOUTH DAILY BEFORE AND BREAKFAST  . isosorbide mononitrate (IMDUR) 60 MG 24 hr tablet TAKE 1 TABLET(60 MG) BY MOUTH DAILY  . metFORMIN (GLUCOPHAGE-XR) 500 MG 24 hr tablet TAKE 2 TABLETS(1000 MG) BY MOUTH TWICE DAILY  . metoprolol succinate (TOPROL-XL) 25 MG 24 hr tablet TAKE 1 TABLET(25 MG) BY MOUTH DAILY  . pantoprazole (PROTONIX) 40 MG tablet TAKE 1 TABLET(40 MG) BY MOUTH DAILY  . ramipril (ALTACE) 10 MG capsule TAKE 1 CAPSULE(10 MG) BY MOUTH DAILY  . XARELTO 20 MG TABS tablet TAKE 1 TABLET(20 MG) BY MOUTH DAILY WITH SUPPER  . canagliflozin (INVOKANA) 300 MG TABS tablet Take 1 tablet (300 mg total) by mouth daily before breakfast. (Patient not taking: No sig reported)  . diltiazem (CARDIZEM CD) 180 MG 24 hr capsule Take 1 capsule (180 mg total) by mouth daily.   No facility-administered encounter medications on file as of 02/14/2021.    Allergies (  verified) Doxycycline   History: Past Medical History:  Diagnosis Date  . Asthma   . COPD (chronic obstructive pulmonary disease) (HCC)   . Diabetes mellitus    Type II  . Early Pulmonary fibrosis (HCC)   . GERD (gastroesophageal reflux  disease)   . History of echocardiogram    a. 02/2017 Echo: EF 60-65%, no rwma, mild MR, mod dil LA. Nl RV fxn. PASP .  Marland Kitchen Hyperlipidemia   . Hypertension   . Morbid obesity (HCC)   . Non-obstructive CAD (coronary artery disease)    a. 2011 Cath: nonobs dzs; b. 07/2014 Cath: LM 30, LAD nl, LCX nl, RCA 20p, 68m.  Marland Kitchen Permanent atrial fibrillation (HCC)    a. CHA2DS2VASc = 4-->xarelto.  . Polyp of sigmoid colon    Past Surgical History:  Procedure Laterality Date  . ANKLE SURGERY    . CARDIAC CATHETERIZATION  05/2010   [facility]  . CARDIAC CATHETERIZATION     [facility]  . CARDIAC CATHETERIZATION     [facility]  . CARDIAC CATHETERIZATION  07/2014   [facility]  . COLONOSCOPY    . COLONOSCOPY WITH PROPOFOL N/A 06/26/2018   Procedure: COLONOSCOPY WITH PROPOFOL;  Surgeon: Pasty Spillers, MD;  Location: [facility] ENDOSCOPY;  Service: Endoscopy;  Laterality: N/A;  . CORONARY STENT INTERVENTION N/A 03/07/2019   Procedure: CORONARY STENT INTERVENTION;  Surgeon: Yvonne Kendall, MD;  Location: [facility] INVASIVE CV LAB;  Service: Cardiovascular;  Laterality: N/A;  . ENTEROSCOPY N/A 08/05/2018   Procedure: ENTEROSCOPY;  Surgeon: Pasty Spillers, MD;  Location: [facility] ENDOSCOPY;  Service: Endoscopy;  Laterality: N/A;  . ESOPHAGOGASTRODUODENOSCOPY (EGD) WITH PROPOFOL N/A 06/26/2018   Procedure: ESOPHAGOGASTRODUODENOSCOPY (EGD) WITH PROPOFOL;  Surgeon: Pasty Spillers, MD;  Location: [facility] ENDOSCOPY;  Service: Endoscopy;  Laterality: N/A;  . LEFT HEART CATH AND CORONARY ANGIOGRAPHY N/A 03/07/2019   Procedure: LEFT HEART CATH AND CORONARY ANGIOGRAPHY;  Surgeon: Yvonne Kendall, MD;  Location: [facility] INVASIVE CV LAB;  Service: Cardiovascular;  Laterality: N/A;  . TESTICLE SURGERY  70's  . TUMOR EXCISION     Neck and finger; benign  . WRIST SURGERY     Family History  Problem Relation Age of Onset  . Hypertension Mother   . Hyperlipidemia Mother   . Diabetes Mother   . Heart disease Mother        CABG  . Pneumonia  Father        rare type  . Diabetes Other   . Depression Other   . Coronary artery disease Other   . Alcohol abuse Other   . Hypertension Other   . Hyperlipidemia Other   . Cancer Brother   . Kidney Stones Brother    Social History   Socioeconomic History  . Marital status: Widowed    Spouse name: Not on file  . Number of children: Not on file  . Years of education: Not on file  . Highest education level: 8th grade  Occupational History  . Occupation: part time   Tobacco Use  . Smoking status: Former Smoker    Packs/day: 4.00    Years: 28.00    Pack years: 112.00    Types: Cigarettes    Quit date: 08/25/1993    Years since quitting: 27.4  . Smokeless tobacco: Never Used  Vaping Use  . Vaping Use: Never used  Substance and Sexual Activity  . Alcohol use: Not Currently    Comment: rarely, occasional beer   . Drug use: No  . Sexual activity:  Not on file  Other Topics Concern  . Not on file  Social History Narrative   No regular exercise.   Social Determinants of Health   Financial Resource Strain: Low Risk   . Difficulty of Paying Living Expenses: Not hard at all  Food Insecurity: No Food Insecurity  . Worried About Programme researcher, broadcasting/film/video in the Last Year: Never true  . Ran Out of Food in the Last Year: Never true  Transportation Needs: No Transportation Needs  . Lack of Transportation (Medical): No  . Lack of Transportation (Non-Medical): No  Physical Activity: Inactive  . Days of Exercise per Week: 0 days  . Minutes of Exercise per Session: 0 min  Stress: No Stress Concern Present  . Feeling of Stress : Not at all  Social Connections: Socially Isolated  . Frequency of Communication with Friends and Family: More than three times a week  . Frequency of Social Gatherings with Friends and Family: More than three times a week  . Attends Religious Services: Never  . Active Member of Clubs or Organizations: No  . Attends Banker Meetings: Never  .  Marital Status: Widowed    Tobacco Counseling Counseling given: Not Answered   Clinical Intake:  Pre-visit preparation completed: Yes  Pain : No/denies pain     Nutritional Status: BMI > 30  Obese Nutritional Risks: None Diabetes: Yes  How often do you need to have someone help you when you read instructions, pamphlets, or other written materials from your doctor or pharmacy?: 1 - Never What is the last grade level you completed in school?: 8th grade  Diabetic? Yes Nutrition Risk Assessment:  Has the patient had any N/V/D within the last 2 months?  No  Does the patient have any non-healing wounds?  No  Has the patient had any unintentional weight loss or weight gain?  No   Diabetes:  Is the patient diabetic?  Yes  If diabetic, was a CBG obtained today?  No  Did the patient bring in their glucometer from home?  No  How often do you monitor your CBG's? Does not.   Financial Strains and Diabetes Management:  Are you having any financial strains with the device, your supplies or your medication? No .  Does the patient want to be seen by Chronic Care Management for management of their diabetes?  No  Would the patient like to be referred to a Nutritionist or for Diabetic Management?  No   Diabetic Exams:  Diabetic Eye Exam: Overdue for diabetic eye exam. Pt has been advised about the importance in completing this exam. Patient advised to call and schedule an eye exam. Diabetic Foot Exam: Completed 04/12/2020   Interpreter Needed?: No  Information entered by :: NAllen LPN   Activities of Daily Living In your present state of health, do you have any difficulty performing the following activities: 02/14/2021  Hearing? Y  Vision? N  Difficulty concentrating or making decisions? N  Walking or climbing stairs? N  Dressing or bathing? N  Doing errands, shopping? N  Preparing Food and eating ? N  Using the Toilet? N  In the past six months, have you accidently leaked  urine? N  Do you have problems with loss of bowel control? N  Managing your Medications? N  Managing your Finances? N  Housekeeping or managing your Housekeeping? N  Some recent data might be hidden    Patient Care Team: Loura Pardon, MD as PCP - Mikey Kirschner,  Tollie Pizza, MD as PCP - Cardiology (Cardiology) Marlowe Sax, RN as Case Manager (General Practice) Lajean Manes, [facility] (Pharmacist)  Indicate any recent Medical Services you may have received from other than [facility] providers in the past year (date may be approximate).     Assessment:   This is a routine wellness examination for [employer].  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  DIET - INCREASE WATER INTAKE       recommend drinking at least 6-8 glasses of water a day     .  DIET - REDUCE SUGAR INTAKE    .  Patient Stated      02/14/2021, wants to lose weight    .  Pharmacy Care Plan    .  PharmD "My medications are expensive" (pt-stated)      CARE PLAN ENTRY (see longtitudinal plan of care for additional care plan information)  Current Barriers:  . Financial barriers - previously noted concerns affording Xarelto, Invokana, Symbicort, but never returned patient assistance application information . Diabetes: controlled; complicated by chronic medical conditions including hx Aib, CAD, COPD, most recent A1c 5.8% o Reports he has lost ~50 lbs by adjusting eating habits . Most recent eGFR: ~92 mL/min . Current antihyperglycemic regimen: metformin XR 1000 mg BID, glipizide 5 mg w/ supper, Invokana 300 mg QAM . Does report one episode of shaking in the evening hat he thought was hypoglycemia, but he did not  check his sugars to confirm  . Current blood glucose readings:  o Fasting: 99-130s o Not checking any other times of the day . Cardiovascular risk reduction: o Current hypertensive regimen: furosemide 20 mg daily, diltiazem 180 mg daily, metoprolol succinate 25 mg daily, ramipril 10 mg daily, isosorbide 60 mg daily o Current hyperlipidemia regimen: atorvastatin 80 mg daily; LDL well controlled at goal <70 o Current antiplatelet regimen: clopidogrel 75 mg daily + Xarelto 20 mg daily  . COPD: previous followed w/ pulmonary. Last spirometry in 2015 showed normal spirometry, Dr. Clovis Fredrickson note in 2019 indicated some components of interstitial lung disease w/ underlying COPD.   Pharmacist Clinical Goal(s):  Marland Kitchen Over the next 90 days, patient will work with PharmD and primary care provider to address optimized medication management  Interventions: . Comprehensive medication review performed, medication list updated in electronic medical record . Inter-disciplinary care team collaboration (see longitudinal plan of care) . Praised patient for obtaining control of A1c.  . Reviewed goal A1c, goal fasting, and goal 2 hour post prandial glucose. Encouraged to continue to check fasting readings, but add occasional post supper checks. Providing w/ log to document to provide at future review.  . Discussed hypoglycemia after supper. Recommend hold glipizide, and monitor sugars after. Patient verbalizes understanding. Encouraged to review post-supper readings w/ RN CM at next phone call to evaluate impact of holding glipizide.  . Discussed benefits of home BP monitoring. Reviewed to call [facility] and ask about Over the Counter benefits to purchase a BP machine, recommend arm over wrist. He verbalized understanding . Will have patient review income and out of pocket spend to determine if  we can obtain expensive medications through patient assistance.   Patient Self Care Activities:  . Patient will check blood glucose  daily, document, and provide at future appointments . Patient will take medications as prescribed . Patient will report any questions or concerns to provider   Please see past updates related to this goal by clicking on the "Past Updates" button in the selected goal      .  RNCM: Pt-" I have not taken my blood sugars in 2 weeks" (pt-stated)      CARE PLAN ENTRY (see longitudinal plan of care for additional care plan information)  Current Barriers:  Marland Kitchen Knowledge Deficits related to the importance of managing diabetes as evidence of hemoglobin A1C of 14 in November 2020, newest hemoglobin A1C 5.8 on 04-12-2020 . Lacks caregiver support.  . Corporate treasurer.  . Non-adherence to prescribed medication regimen . Difficulty obtaining medications . Lacks motivation   Nurse Case Manager Clinical Goal(s):  Marland Kitchen Over the next 120 days, patient will verbalize understanding of plan for diabetes management  . Over the next 120 days, patient will work with RNCM, pcp and pharmacist to address needs related to effective diabetes management  . Over the next 120 days, patient will demonstrate a decrease in high blood sugars exacerbations as evidenced by monitoring blood sugars daily, adhering  to dietary restrictions, and following plan of care as recommended by the provider . Over the next 120 days, patient will attend all scheduled medical appointments: appointment with pcp on 04-12-2020 . Over the next 120 days, patient will demonstrate improved adherence to prescribed treatment plan for diabetes as evidenced bychecking blood sugars daily, decrease in hemoglobin A1C, compliance with medications regimen and following the plan of care established by provider . Over the next 120 days, patient will verbalize basic understanding of diabetes disease process and self health management plan as evidenced by compliance with plan of care and decrease in blood sugar levels . Over the next 120 days, patient will  demonstrate understanding of rationale for each prescribed medication as evidenced by compliance and ability to obtain medications without cost constraints  . Over the next 120 days, patient will work with CM team pharmacist to get needed help with cost of prescriptions, obtaining needed refills and being more compliant with medication regimen.   Interventions:  . Inter-disciplinary care team collaboration (see longitudinal plan of care) . Evaluation of current treatment plan related to diabetes management and patient's adherence to plan as established by provider. . Advised patient to check with pharmacy concerning needed refills. The patient states that he was there yesterday and the still do not have scripts for refills. The patient also needs a new glucose meter that his insurance provider will pay for strips and kit. Will send inbasket message to pharmacist and pcp- Completed . Provided education to patient re: on putting am pills he takes in a plastic bag and have with his clothes so he will not forget to take or set an alarm on his phone to remind him to take. The patient admits when he is working he leaves and forgets to take his medications . Reviewed medications with patient and discussed compliance and ways to remember to take medications.  Steele Sizer with CCM team pharmacist and pcp regarding the need for new glucose machine ( would like one where the insurance will pay for kit and strips), the need for medication scripts, the patient not taking blood glucose levels regularly and not always following dietary restrictions. Marland Kitchen  Discussed plans with patient for ongoing care management follow up and provided patient with direct contact information for care management team . Advised patient, providing education and rationale, to check cbg at least one time daily and record, calling pcp for findings outside established parameters.   . Education on the benefits of a heart healthy/ADA diet in  controlling blood sugar levels. The patient eats out a lot and admits he eats more sweets than he should at times.  The patient is now eating one meal a day and has cut back considerably. Has lost weight. Newest hemoglobin A1C on 04-12-2020 was 5.8. Praised for accomplishments.  . Evaluation of post prandial blood sugars. The patient verbalized the highest post prandial was 172. Fasting is 90's to 110.  He has not checked in a couple days due to being stung by hornets and not feeling the best. Feels better now.  . Education on the negative impact of uncontrolled diabetes on other body systems.  The patient verbalized he needs to do better at taking care of his diabetes.  . Pharmacy referral for help with medication cost, compliance and scripts.   Patient Self Care Activities:  . Patient verbalizes understanding of plan to work with CCM team to help patient manage diabetes more effectively . Self administers medications as prescribed . Attends all scheduled provider appointments . Calls pharmacy for medication refills . Calls provider office for new concerns or questions . Unable to independently manage diabetes as evidence of hemoglobin A1C of 14 in November 2020. Most updated hemoglobin A1C was 5.8 on 04-12-2020. Marland Kitchen Unable to self administer medications as prescribed . Does not adhere to provider recommendations re:  Marland Kitchen Does not adhere to prescribed medication regimen . Does not contact provider office for questions/concerns  Please see past updates related to this goal by clicking on the "Past Updates" button in the selected goal      .  RNCM: Pt-"I have a lot of health issues" (pt-stated)      CARE PLAN ENTRY (see longtitudinal plan of care for additional care plan information)  Current Barriers:  . Chronic Disease Management support, education, and care coordination needs related to Atrial Fibrillation, HTN, HLD, COPD, and RLS . Does not have blood pressure cuff to check blood sugars . No  AD/LW . Needs scripts for gabapentin, albuterol inhaler, and cream for athlete's feet  Clinical Goal(s) related to Atrial Fibrillation, HTN, HLD, COPD, and RLS :  Over the next 120 days, patient will:  . Work with the care management team to address educational, disease management, and care coordination needs  . Begin or continue self health monitoring activities as directed today Measure and record blood pressure 2 times per week and adhere to a heart healthy/ADA diet . Call provider office for new or worsened signs and symptoms Blood pressure findings outside established parameters, Oxygen saturation lower than established parameter, Chest pain, Shortness of breath, and New or worsened symptom related to HLD, RLS and other chronic conditions . Call care management team with questions or concerns . Verbalize basic understanding of patient centered plan of care established today  Interventions related to Atrial Fibrillation, HTN, HLD, COPD, and RLS :  . Evaluation of current treatment plans and patient's adherence to plan as established by provider . Assessed patient understanding of disease states . Assessed patient's education and care coordination needs.  The patient needs scripts for his inhaler, gabapentin, athlete's feet and new glucose meter. Also having difficulty with medication cost. Done  .  Assessed concern of RLS being a big issue for him right now. He says the pharmacist has not received the script for gabapentin. In basket message to the pharmacist and pcp. Done . Provided disease specific education to patient: Education on heart healthy/ADA diet and benefits. The patient eats out a lot.  06-04-2020: Has lost weight and is being more mindful of his dietary restrictions.  . Provided education on checking blood pressures at home. The patient does not have a blood pressure cuff. Will work on ways to get a blood pressure cuff for the patient. Plan to give patient a log book at 04-12-2020  appointment for writing down his blood pressures and blood glucose readings.  . Education on AD/LW- the patient agrees to receive a packet to review with his daughter at his visit on 04-12-2020.  Steele Sizer with appropriate clinical care team members regarding patient needs.  The patient has worked with the pharmacist in the past. Re referral for help with medication cost, obtaining refills, and ways to get the patient to take medication as prescribed.  . Evaluation of bee stings and missed appointment on 06-04-2020 with pcp. The patient states he got stung day before last and wanted to be seen in the office on 06/03/2020. He hung up and did not get the time for appointment. States he did not need to be seen today as he is better. The patient has been taking over the counter benadryl, has swabbed the areas with alcohol and used camomile lotion on the area. The patient verbalized that he has 5 areas where her got stung on his arms. Education on going to ER or urgent care for changes in condition. The patient has not had an allergic reaction to bee stings in the past. Education and support given.   Patient Self Care Activities related to Atrial Fibrillation, HTN, HLD, COPD, and RLS :  . Patient is unable to independently self-manage chronic health conditions  Please see past updates related to this goal by clicking on the "Past Updates" button in the selected goal        Depression Screen PHQ 2/9 Scores 02/14/2021 04/02/2020 02/09/2020 02/03/2019 05/28/2018 02/01/2018 11/02/2017  PHQ - 2 Score 0 0 0 1 0 0 0    Fall Risk Fall Risk  02/09/2020 02/03/2019 05/28/2018 03/13/2018 02/05/2018  Falls in the past year? 0 1 Yes Yes Yes  Comment - falls at work  - - -  Number falls in past yr: 0 1 1 2  or more 1  Injury with Fall? 0 1 Yes Yes Yes  Risk Factor Category  - - - High Fall Risk High Fall Risk  Risk for fall due to : - History of fall(s) - - -  Follow up - Falls prevention discussed;Falls evaluation  completed;Education provided Falls evaluation completed Falls evaluation completed -    FALL RISK PREVENTION PERTAINING TO THE HOME:  Any stairs in or around the home? Yes  If so, are there any without handrails? No  Home free of loose throw rugs in walkways, pet beds, electrical cords, etc? Yes  Adequate lighting in your home to reduce risk of falls? Yes   ASSISTIVE DEVICES UTILIZED TO PREVENT FALLS:  Life alert? No  Use of a cane, walker or w/c? No  Grab bars in the bathroom? No  Shower chair or bench in shower? No  Elevated toilet seat or a handicapped toilet? No   TIMED UP AND GO:  Was the test performed? No . .  Cognitive Function:     6CIT Screen 02/14/2021 02/03/2019 02/01/2018  What Year? 0 points 0 points 0 points  What month? 0 points 0 points 0 points  What time? 0 points 0 points 0 points  Count back from 20 0 points 0 points 0 points  Months in reverse 0 points 0 points 0 points  Repeat phrase 4 points 2 points 2 points  Total Score 4 2 2     Immunizations Immunization History  Administered Date(s) Administered  . Fluad Quad(high Dose 65+) 11/21/2019, 11/19/2020  . Influenza Split 08/25/2013  . Influenza, High Dose Seasonal PF 09/20/2016, 11/02/2017, 10/10/2018  . Influenza,inj,Quad PF,6+ Mos 11/17/2015  . Influenza-Unspecified 12/23/2014  . PFIZER(Purple Top)SARS-COV-2 Vaccination 11/22/2020, 12/13/2020  . Pneumococcal Conjugate-13 11/25/2015  . Pneumococcal Polysaccharide-23 10/25/2013, 02/03/2019  . Pneumococcal-Unspecified 10/26/2009  . Tdap 11/06/2012  . Zoster 02/04/2013    TDAP status: Up to date  Flu Vaccine status: Up to date  Pneumococcal vaccine status: Up to date  Covid-19 vaccine status: Completed vaccines  Qualifies for Shingles Vaccine? Yes   Zostavax completed Yes   Shingrix Completed?: No.    Education has been provided regarding the importance of this vaccine. Patient has been advised to call insurance company to determine out  of pocket expense if they have not yet received this vaccine. Advised may also receive vaccine at local pharmacy or Health Dept. Verbalized acceptance and understanding.  Screening Tests Health Maintenance  Topic Date Due  . OPHTHALMOLOGY EXAM  04/20/2019  . FOOT EXAM  04/12/2021  . HEMOGLOBIN A1C  05/20/2021  . COVID-19 Vaccine (3 - Booster for Pfizer series) 06/13/2021  . TETANUS/TDAP  11/06/2022  . COLONOSCOPY (Pts 45-48yrs Insurance coverage will need to be confirmed)  06/27/2023  . INFLUENZA VACCINE  Completed  . Hepatitis C Screening  Completed  . PNA vac Low Risk Adult  Completed    Health Maintenance  Health Maintenance Due  Topic Date Due  . OPHTHALMOLOGY EXAM  04/20/2019    Colorectal cancer screening: Type of screening: Colonoscopy. Completed 06/26/2018. Repeat every 5 years  Lung Cancer Screening: (Low Dose CT Chest recommended if Age 32-80 years, 30 pack-year currently smoking OR have quit w/in 15years.) does not qualify.   Lung Cancer Screening Referral: no  Additional Screening:  Hepatitis C Screening: does qualify; Completed 11/25/2015  Vision Screening: Recommended annual ophthalmology exams for early detection of glaucoma and other disorders of the eye. Is the patient up to date with their annual eye exam?  No  Who is the provider or what is the name of the office in which the patient attends annual eye exams? [facility] If pt is not established with a provider, would they like to be referred to a provider to establish care? No .   Dental Screening: Recommended annual dental exams for proper oral hygiene  Community Resource Referral / Chronic Care Management: CRR required this visit?  No   CCM required this visit?  No      Plan:     I have personally reviewed and noted the following in the patient's chart:   . Medical and social history . Use of alcohol, tobacco or illicit drugs  . Current medications and supplements . Functional ability and  status . Nutritional status . Physical activity . Advanced directives . List of other physicians . Hospitalizations, surgeries, and ER visits in previous 12 months . Vitals . Screenings to include cognitive, depression, and falls . Referrals and appointments  In addition, I have  reviewed and discussed with patient certain preventive protocols, quality metrics, and best practice recommendations. A written personalized care plan for preventive services as well as general preventive health recommendations were provided to patient.     CHAISE GOLDSBORO, LPN   08/15/5620   Nurse Notes:

## 2021-02-14 NOTE — Patient Instructions (Signed)
[name] , Thank you for taking time to come for your Medicare Wellness Visit. I appreciate your ongoing commitment to your health goals. Please review the following plan we discussed and let me know if I can assist you in the future.   Screening recommendations/referrals: Colonoscopy: completed 06/26/2018 Recommended yearly ophthalmology/optometry visit for glaucoma screening and checkup Recommended yearly dental visit for hygiene and checkup  Vaccinations: Influenza vaccine: completed 11/19/2020, due 07/18/2021 Pneumococcal vaccine: completed 02/03/2019 Tdap vaccine: completed 11/06/2012, due 11/06/2022 Shingles vaccine: discussed   Covid-19:  11/22/2020, 12/13/2020  Advanced directives: Advance directive discussed with you today. Even though you declined this today please call our office should you change your mind and we can give you the proper paperwork for you to fill out.   Conditions/risks identified: none  Next appointment: Follow up in one year for your annual wellness visit.   Preventive Care 66 Years and Older, Male Preventive care refers to lifestyle choices and visits with your health care provider that can promote health and wellness. What does preventive care include?  A yearly physical exam. This is also called an annual well check.  Dental exams once or twice a year.  Routine eye exams. Ask your health care provider how often you should have your eyes checked.  Personal lifestyle choices, including:  Daily care of your teeth and gums.  Regular physical activity.  Eating a healthy diet.  Avoiding tobacco and drug use.  Limiting alcohol use.  Practicing safe sex.  Taking low doses of aspirin every day.  Taking vitamin and mineral supplements as recommended by your health care provider. What happens during an annual well check? The services and screenings done by your health care provider during your annual well check will depend on your age, overall  health, lifestyle risk factors, and family history of disease. Counseling  Your health care provider may ask you questions about your:  Alcohol use.  Tobacco use.  Drug use.  Emotional well-being.  Home and relationship well-being.  Sexual activity.  Eating habits.  History of falls.  Memory and ability to understand (cognition).  Work and work Statistician. Screening  You may have the following tests or measurements:  Height, weight, and BMI.  Blood pressure.  Lipid and cholesterol levels. These may be checked every 5 years, or more frequently if you are over 73 years old.  Skin check.  Lung cancer screening. You may have this screening every year starting at age 65 if you have a 30-pack-year history of smoking and currently smoke or have quit within the past 15 years.  Fecal occult blood test (FOBT) of the stool. You may have this test every year starting at age 11.  Flexible sigmoidoscopy or colonoscopy. You may have a sigmoidoscopy every 5 years or a colonoscopy every 10 years starting at age 61.  Prostate cancer screening. Recommendations will vary depending on your family history and other risks.  Hepatitis C blood test.  Hepatitis B blood test.  Sexually transmitted disease (STD) testing.  Diabetes screening. This is done by checking your blood sugar (glucose) after you have not eaten for a while (fasting). You may have this done every 1-3 years.  Abdominal aortic aneurysm (AAA) screening. You may need this if you are a current or former smoker.  Osteoporosis. You may be screened starting at age 36 if you are at high risk. Talk with your health care provider about your test results, treatment options, and if necessary, the need for more tests.  Vaccines  Your health care provider may recommend certain vaccines, such as:  Influenza vaccine. This is recommended every year.  Tetanus, diphtheria, and acellular pertussis (Tdap, Td) vaccine. You may need a Td  booster every 10 years.  Zoster vaccine. You may need this after age 23.  Pneumococcal 13-valent conjugate (PCV13) vaccine. One dose is recommended after age 35.  Pneumococcal polysaccharide (PPSV23) vaccine. One dose is recommended after age 87. Talk to your health care provider about which screenings and vaccines you need and how often you need them. This information is not intended to replace advice given to you by your health care provider. Make sure you discuss any questions you have with your health care provider. Document Released: 12/31/2015 Document Revised: 08/23/2016 Document Reviewed: 10/05/2015 Elsevier Interactive Patient Education  2017 [employer] Prevention in the Home Falls can cause injuries. They can happen to people of all ages. There are many things you can do to make your home safe and to help prevent falls. What can I do on the outside of my home?  Regularly fix the edges of walkways and driveways and fix any cracks.  Remove anything that might make you trip as you walk through a door, such as a raised step or threshold.  Trim any bushes or trees on the path to your home.  Use bright outdoor lighting.  Clear any walking paths of anything that might make someone trip, such as rocks or tools.  Regularly check to see if handrails are loose or broken. Make sure that both sides of any steps have handrails.  Any raised decks and porches should have guardrails on the edges.  Have any leaves, snow, or ice cleared regularly.  Use sand or salt on walking paths during winter.  Clean up any spills in your garage right away. This includes oil or grease spills. What can I do in the bathroom?  Use night lights.  Install grab bars by the toilet and in the tub and shower. Do not use towel bars as grab bars.  Use non-skid mats or decals in the tub or shower.  If you need to sit down in the shower, use a plastic, non-slip stool.  Keep the floor dry. Clean up  any water that spills on the floor as soon as it happens.  Remove soap buildup in the tub or shower regularly.  Attach bath mats securely with double-sided non-slip rug tape.  Do not have throw rugs and other things on the floor that can make you trip. What can I do in the bedroom?  Use night lights.  Make sure that you have a light by your bed that is easy to reach.  Do not use any sheets or blankets that are too big for your bed. They should not hang down onto the floor.  Have a firm chair that has side arms. You can use this for support while you get dressed.  Do not have throw rugs and other things on the floor that can make you trip. What can I do in the kitchen?  Clean up any spills right away.  Avoid walking on wet floors.  Keep items that you use a lot in easy-to-reach places.  If you need to reach something above you, use a strong step stool that has a grab bar.  Keep electrical cords out of the way.  Do not use floor polish or wax that makes floors slippery. If you must use wax, use non-skid floor wax.  Do not have throw rugs and other things on the floor that can make you trip. What can I do with my stairs?  Do not leave any items on the stairs.  Make sure that there are handrails on both sides of the stairs and use them. Fix handrails that are broken or loose. Make sure that handrails are as long as the stairways.  Check any carpeting to make sure that it is firmly attached to the stairs. Fix any carpet that is loose or worn.  Avoid having throw rugs at the top or bottom of the stairs. If you do have throw rugs, attach them to the floor with carpet tape.  Make sure that you have a light switch at the top of the stairs and the bottom of the stairs. If you do not have them, ask someone to add them for you. What else can I do to help prevent falls?  Wear shoes that:  Do not have high heels.  Have rubber bottoms.  Are comfortable and fit you well.  Are  closed at the toe. Do not wear sandals.  If you use a stepladder:  Make sure that it is fully opened. Do not climb a closed stepladder.  Make sure that both sides of the stepladder are locked into place.  Ask someone to hold it for you, if possible.  Clearly mark and make sure that you can see:  Any grab bars or handrails.  First and last steps.  Where the edge of each step is.  Use tools that help you move around (mobility aids) if they are needed. These include:  Canes.  Walkers.  Scooters.  Crutches.  Turn on the lights when you go into a dark area. Replace any light bulbs as soon as they burn out.  Set up your furniture so you have a clear path. Avoid moving your furniture around.  If any of your floors are uneven, fix them.  If there are any pets around you, be aware of where they are.  Review your medicines with your doctor. Some medicines can make you feel dizzy. This can increase your chance of falling. Ask your doctor what other things that you can do to help prevent falls. This information is not intended to replace advice given to you by your health care provider. Make sure you discuss any questions you have with your health care provider. Document Released: 09/30/2009 Document Revised: 05/11/2016 Document Reviewed: 01/08/2015 Elsevier Interactive Patient Education  2017 [employer].

## 2021-03-04 ENCOUNTER — Other Ambulatory Visit: Payer: Self-pay | Admitting: Nurse Practitioner

## 2021-03-04 DIAGNOSIS — E1122 Type 2 diabetes mellitus with diabetic chronic kidney disease: Secondary | ICD-10-CM

## 2021-03-04 DIAGNOSIS — I10 Essential (primary) hypertension: Secondary | ICD-10-CM

## 2021-03-09 DIAGNOSIS — I700 Atherosclerosis of aorta: Secondary | ICD-10-CM | POA: Insufficient documentation

## 2021-03-10 ENCOUNTER — Ambulatory Visit: Payer: Medicare HMO | Admitting: Internal Medicine

## 2021-03-10 ENCOUNTER — Telehealth: Payer: Self-pay | Admitting: Internal Medicine

## 2021-03-10 NOTE — Telephone Encounter (Signed)
Patient was scheduled today to see PCP and overslept due to him being up a 4am due to eating slaw the night before and it did not agree with his stomach.  Patient apologize for No Showing today appt and [facility] to tomorrow with PCP.    Patient is completely out of the following medication and would like a short supply of ramipril (ALTACE) 10 MG capsule, glipiZIDE (GLUCOTROL) 5 MG tablet , metoprolol succinate (TOPROL-XL) 25 MG 24 hr tablet    [id number] - [place], [place] AT [facility] OF [address] Phone:  [phone]  Fax:  [phone]

## 2021-03-10 NOTE — Telephone Encounter (Signed)
Not our pt

## 2021-03-11 ENCOUNTER — Other Ambulatory Visit: Payer: Self-pay

## 2021-03-11 ENCOUNTER — Ambulatory Visit (INDEPENDENT_AMBULATORY_CARE_PROVIDER_SITE_OTHER): Payer: Medicare HMO | Admitting: Internal Medicine

## 2021-03-11 ENCOUNTER — Encounter: Payer: Self-pay | Admitting: Internal Medicine

## 2021-03-11 DIAGNOSIS — G2581 Restless legs syndrome: Secondary | ICD-10-CM | POA: Diagnosis not present

## 2021-03-11 DIAGNOSIS — I482 Chronic atrial fibrillation, unspecified: Secondary | ICD-10-CM

## 2021-03-11 DIAGNOSIS — I10 Essential (primary) hypertension: Secondary | ICD-10-CM

## 2021-03-11 MED ORDER — METFORMIN HCL ER 500 MG PO TB24: ORAL_TABLET | ORAL | 1 refills | Status: DC

## 2021-03-11 MED ORDER — DILTIAZEM HCL ER COATED BEADS 180 MG PO CP24: 180.0000 mg | ORAL_CAPSULE | Freq: Every day | ORAL | 3 refills | Status: DC

## 2021-03-11 MED ORDER — CLOTRIMAZOLE-BETAMETHASONE 1-0.05 % EX CREA: 1.0000 "application " | TOPICAL_CREAM | Freq: Two times a day (BID) | CUTANEOUS | 2 refills | Status: DC

## 2021-03-11 MED ORDER — ATORVASTATIN CALCIUM 80 MG PO TABS: ORAL_TABLET | ORAL | 1 refills | Status: DC

## 2021-03-11 MED ORDER — GABAPENTIN 600 MG PO TABS: ORAL_TABLET | ORAL | 0 refills | Status: DC

## 2021-03-11 MED ORDER — BUDESONIDE-FORMOTEROL FUMARATE 160-4.5 MCG/ACT IN AERO: 2.0000 | INHALATION_SPRAY | Freq: Two times a day (BID) | RESPIRATORY_TRACT | 12 refills | Status: DC

## 2021-03-11 MED ORDER — ISOSORBIDE MONONITRATE ER 60 MG PO TB24: ORAL_TABLET | ORAL | 3 refills | Status: DC

## 2021-03-11 MED ORDER — RIVAROXABAN 20 MG PO TABS: ORAL_TABLET | ORAL | 0 refills | Status: DC

## 2021-03-11 MED ORDER — ALBUTEROL SULFATE HFA 108 (90 BASE) MCG/ACT IN AERS: 2.0000 | INHALATION_SPRAY | RESPIRATORY_TRACT | 6 refills | Status: DC | PRN

## 2021-03-11 MED ORDER — RAMIPRIL 10 MG PO CAPS: 10.0000 mg | ORAL_CAPSULE | Freq: Every day | ORAL | 0 refills | Status: DC

## 2021-03-11 MED ORDER — METOPROLOL SUCCINATE ER 25 MG PO TB24: 25.0000 mg | ORAL_TABLET | Freq: Every day | ORAL | 0 refills | Status: DC

## 2021-03-11 MED ORDER — PANTOPRAZOLE SODIUM 40 MG PO TBEC: DELAYED_RELEASE_TABLET | ORAL | 2 refills | Status: DC

## 2021-03-11 MED ORDER — CLOPIDOGREL BISULFATE 75 MG PO TABS: ORAL_TABLET | ORAL | 0 refills | Status: DC

## 2021-03-11 NOTE — Progress Notes (Signed)
Ht 5' 8.11" (1.73 m)   Wt 269 lb 3.2 oz (122.1 kg)   BMI 40.80 kg/m    Subjective:    Patient ID: Ernest [name], male    DOB: 1951/08/28, 70 y.o.   MRN: [id number]  HPI: Ernest [name] is a 70 y.o. male   Pt is here to establish care he has a ho  A-fib, T2DM, HTN.   Diabetes He presents for his follow-up diabetic visit. He has type 2 diabetes mellitus. Pertinent negatives for hypoglycemia include no confusion, dizziness, nervousness/anxiousness or speech difficulty. Pertinent negatives for diabetes include no blurred vision, no polydipsia, no polyphagia and no polyuria.  Hypertension This is a chronic problem. The current episode started more than 1 year ago. The problem is uncontrolled. Pertinent negatives include no anxiety, blurred vision, malaise/fatigue, palpitations or peripheral edema. (Bp high today at 172/84 )    Chief Complaint  Patient presents with  . Medication Refill    Relevant past medical, surgical, family and social history reviewed and updated as indicated. Interim medical history since our last visit reviewed. Allergies and medications reviewed and updated.  Review of Systems  Constitutional: Negative for activity change, appetite change and malaise/fatigue.  Eyes: Negative for blurred vision and visual disturbance.  Respiratory: Negative for apnea and chest tightness.   Cardiovascular: Negative for palpitations.  Gastrointestinal: Negative for abdominal distention and diarrhea.  Endocrine: Negative for cold intolerance, heat intolerance, polydipsia, polyphagia and polyuria.  Genitourinary: Negative for difficulty urinating, dysuria, frequency, hematuria and urgency.  Skin: Negative for color change.  Neurological: Negative for dizziness, speech difficulty and light-headedness.  Psychiatric/Behavioral: Negative for behavioral problems and confusion. The patient is not nervous/anxious.     Per HPI unless specifically indicated above      Objective:    Ht 5' 8.11" (1.73 m)   Wt 269 lb 3.2 oz (122.1 kg)   BMI 40.80 kg/m   Wt Readings from Last 3 Encounters:  03/11/21 269 lb 3.2 oz (122.1 kg)  02/14/21 260 lb (117.9 kg)  01/06/21 260 lb (117.9 kg)    Physical Exam Vitals and nursing note reviewed.  Constitutional:      General: He is not in acute distress.    Appearance: Normal appearance. He is not ill-appearing or diaphoretic.  HENT:     Head: Normocephalic and atraumatic.     Right Ear: Tympanic membrane and external ear normal. There is no impacted cerumen.     Left Ear: External ear normal.     Nose: No congestion or rhinorrhea.     Mouth/Throat:     Pharynx: No oropharyngeal exudate or posterior oropharyngeal erythema.  Eyes:     Conjunctiva/sclera: Conjunctivae normal.     Pupils: Pupils are equal, round, and reactive to light.  Cardiovascular:     Rate and Rhythm: Normal rate and regular rhythm.     Heart sounds: No murmur heard. No friction rub. No gallop.   Pulmonary:     Effort: No respiratory distress.     Breath sounds: No stridor. No wheezing or rhonchi.  Chest:     Chest wall: No tenderness.  Abdominal:     General: Abdomen is flat. Bowel sounds are normal.     Palpations: Abdomen is soft. There is no mass.     Tenderness: There is no abdominal tenderness.  Musculoskeletal:     Cervical back: Normal range of motion and neck supple. No rigidity or tenderness.     Left lower leg: No  edema.  Skin:    General: Skin is warm and dry.  Neurological:     Mental Status: He is alert.     Results for orders placed or performed in visit on 12/20/20  B12  Result Value Ref Range   Vitamin B-12 352 232 - 1,245 pg/mL  Iron, TIBC and Ferritin Panel  Result Value Ref Range   Total Iron Binding Capacity 487 (H) 250 - 450 ug/dL   UIBC 430 (H) 111 - 343 ug/dL   Iron 57 38 - 169 ug/dL   Iron Saturation 12 (L) 15 - 55 %   Ferritin 35 30 - 400 ng/mL  CBC with Differential/Platelet  Result Value Ref  Range   WBC 6.6 3.4 - 10.8 x10E3/uL   RBC 5.38 4.14 - 5.80 x10E6/uL   Hemoglobin 13.3 13.0 - 17.7 g/dL   Hematocrit 42.8 37.5 - 51.0 %   MCV 80 79 - 97 fL   MCH 24.7 (L) 26.6 - 33.0 pg   MCHC 31.1 (L) 31.5 - 35.7 g/dL   RDW 15.1 11.6 - 15.4 %   Platelets 196 150 - 450 x10E3/uL   Neutrophils 49 Not Estab. %   Lymphs 42 Not Estab. %   Monocytes 6 Not Estab. %   Eos 2 Not Estab. %   Basos 1 Not Estab. %   Neutrophils Absolute 3.3 1.4 - 7.0 x10E3/uL   Lymphocytes Absolute 2.7 0.7 - 3.1 x10E3/uL   Monocytes Absolute 0.4 0.1 - 0.9 x10E3/uL   EOS (ABSOLUTE) 0.2 0.0 - 0.4 x10E3/uL   Basophils Absolute 0.0 0.0 - 0.2 x10E3/uL   Immature Granulocytes 0 Not Estab. %   Immature Grans (Abs) 0.0 0.0 - 0.1 x10E3/uL  TSH  Result Value Ref Range   TSH 2.900 0.450 - 4.500 uIU/mL  T4, free  Result Value Ref Range   Free T4 1.36 0.82 - 1.77 ng/dL      Assessment & Plan:  1. DM Stop invokana and glipizide Continue metformin 1000 mg bid.  Add farxiga or jardiance depending on labs.  check HbA1c,  urine  microalbumin  diabetic diet plan given to pt  adviced regarding hypoglycemia and instructions given to pt today on how to prevent and treat the same if it were to occur. pt acknowledges the plan and voices understanding of the same.  exercise plan given and encouraged.   advice diabetic yearly podiatry, ophthalmology , nutritionist , dental check q 6 months,  2. HTN Metoprolol 25 mg for such and ramipril 10 mg daily as well as cardizem 180 mg  To fu with cards in [place]  Continue current meds.  Medication compliance emphasised. pt advised to keep Bp logs. Pt verbalised understanding of the same. Pt to have a low salt diet . Exercise to reach a goal of at least 150 mins a week.  lifestyle modifications explained and pt understands importance of the above.  3. HLd is on lipitor 80 mg daily recheck FLP, check LFT's work on diet, SE of meds explained to pt. low fat and high fiber diet explained to  pt.  4. Afib needs to fu with Dr. Rockey Situ - is on xareto , b blockers , cardizem plavix and aspirin  Will need a fu asap. Was also rx lasix pt unaware why ? CHF not been seen in 2 yrs,  Will re- refer.  D/w Dr. Rockey Situ pt will be seen by their office.  Problem List Items Addressed This Visit   None  Follow up plan: Return in about 2 weeks (around 03/25/2021).

## 2021-03-15 ENCOUNTER — Telehealth: Payer: Self-pay | Admitting: Pharmacist

## 2021-03-15 ENCOUNTER — Other Ambulatory Visit: Payer: Medicare HMO

## 2021-03-15 NOTE — Chronic Care Management (AMB) (Signed)
    Chronic Care Management Pharmacy Assistant   Name: Ernest [name]  MRN: [id number] DOB: 19-Nov-1951  Reason for Encounter: Reschedule Appointment  Medications: Outpatient Encounter Medications as of 03/15/2021  Medication Sig  . ACCU-CHEK GUIDE test strip   . albuterol (VENTOLIN HFA) 108 (90 Base) MCG/ACT inhaler Inhale 2 puffs into the lungs every 4 (four) hours as needed for wheezing or shortness of breath.  Marland Kitchen atorvastatin (LIPITOR) 80 MG tablet TAKE 1 TABLET(80 MG) BY MOUTH DAILY  . budesonide-formoterol (SYMBICORT) 160-4.5 MCG/ACT inhaler Inhale 2 puffs into the lungs 2 (two) times daily.  . clopidogrel (PLAVIX) 75 MG tablet TAKE 1 TABLET(75 MG) BY MOUTH DAILY WITH BREAKFAST  . clotrimazole-betamethasone (LOTRISONE) cream Apply 1 application topically 2 (two) times daily.  Marland Kitchen diltiazem (CARDIZEM CD) 180 MG 24 hr capsule Take 1 capsule (180 mg total) by mouth daily.  . furosemide (LASIX) 40 MG tablet Take 0.5 tablets (20 mg total) by mouth 2 (two) times daily.  Marland Kitchen gabapentin (NEURONTIN) 600 MG tablet TAKE 2 TABLETS(1200 MG) BY MOUTH AT BEDTIME  . isosorbide mononitrate (IMDUR) 60 MG 24 hr tablet TAKE 1 TABLET(60 MG) BY MOUTH DAILY  . metFORMIN (GLUCOPHAGE-XR) 500 MG 24 hr tablet TAKE 2 TABLETS(1000 MG) BY MOUTH TWICE DAILY  . metoprolol succinate (TOPROL-XL) 25 MG 24 hr tablet Take 1 tablet (25 mg total) by mouth daily.  . pantoprazole (PROTONIX) 40 MG tablet TAKE 1 TABLET(40 MG) BY MOUTH DAILY  . ramipril (ALTACE) 10 MG capsule Take 1 capsule (10 mg total) by mouth daily.  . rivaroxaban (XARELTO) 20 MG TABS tablet TAKE 1 TABLET(20 MG) BY MOUTH DAILY WITH SUPPER   No facility-administered encounter medications on file as of 03/15/2021.    Patient scheduled a follow up telephone appointment with Birdena Crandall, CPP on 04/04/2021 at 1:00 pm.  April D Calhoun, [place] Pharmacist Assistant [phone]

## 2021-03-17 ENCOUNTER — Other Ambulatory Visit: Payer: Medicare HMO

## 2021-03-17 ENCOUNTER — Other Ambulatory Visit: Payer: Self-pay

## 2021-03-17 DIAGNOSIS — E119 Type 2 diabetes mellitus without complications: Secondary | ICD-10-CM

## 2021-03-17 DIAGNOSIS — Z125 Encounter for screening for malignant neoplasm of prostate: Secondary | ICD-10-CM

## 2021-03-17 DIAGNOSIS — Z13220 Encounter for screening for lipoid disorders: Secondary | ICD-10-CM

## 2021-03-17 LAB — MICROALBUMIN, URINE WAIVED
Creatinine, Urine Waived: 100 mg/dL (ref 10–300)
Microalb, Ur Waived: 80 mg/L — ABNORMAL HIGH (ref 0–19)

## 2021-03-17 LAB — BAYER DCA HB A1C WAIVED: HB A1C (BAYER DCA - WAIVED): 6.5 % (ref <7.0)

## 2021-03-17 NOTE — Progress Notes (Signed)
Cardiology Office Note:    Date:  03/18/2021   ID:  [name], DOB Aug 09, 1951, MRN [id number]  PCP:  [name], MD  [facility] Cardiologist:  Ida Rogue, MD  [place] Electrophysiologist:  None   Referring MD: [name], MD   Chief Complaint: 6 month follow-up  History of Present Illness:    Ernest [name] is a 70 y.o. male with a hx of permanent A. fib on Xarelto, obesity, diabetes type 2, hypertension, hyperlipidemia, GERD, COPD, CAD with PCI to the LAD 03/07/2019 who presents for follow-up.  2015 patient had a catheterization showing nonobstructive coronary disease.  In 03/07/2019 patient presented with chest pain found to have non-STEMI.  Cardiac catheterization showed severe single-vessel CAD with 80% mid LAD stenosis treated with successful PCI/DES x1.  Nonobstructive LMCA, left circumflex, RCA disease, normal EF, mild to moderate MR in the setting of catheter induced NSVT.  Last seen televisit 03/18/2019 for hospital follow-up.  He reported fatigue on metoprolol.  Plavix and Xarelto.  From a cardiac perspective he was symptomatically stable.  Today, the patient says he has been doping well. He has gained some weight since the last visit (20-30lbs). Has chronic SOB from COPD, has been wrose with weight gain. Last summer he was down to 230lb. Diet has been eating out a lot. Doesn't cook at home, he lives by himself. He does no formal activity. He tries to stay busy during the day. No chest pain, lower leg edema, orthopnea, pnd. Has some lightheadedness and dizziness from low BG and PCP stopped a diabetes medication. Says he has occasional orthostatic symptoms. BP today elevated. He takes medications at night.  He does not have BP cuff at home. He is on Xarelto and plavix. Has had some nose bleeds. He started taking B12. Recent labs reviewed. Orthostatics negative today.  Past Medical History:  Diagnosis Date  . Asthma   . COPD (chronic obstructive  pulmonary disease) ([place])   . Diabetes mellitus    Type II  . Early Pulmonary fibrosis ([place])   . GERD (gastroesophageal reflux disease)   . History of echocardiogram    a. 02/2017 Echo: EF 60-65%, no rwma, mild MR, mod dil LA. Nl RV fxn. PASP 64mmHg.  Marland Kitchen Hyperlipidemia   . Hypertension   . Morbid obesity ([place])   . Non-obstructive CAD (coronary artery disease)    a. 2011 Cath: nonobs dzs; b. 07/2014 Cath: LM 30, LAD nl, LCX nl, RCA 20p, 8m.  Marland Kitchen Permanent atrial fibrillation (HCC)    a. CHA2DS2VASc = 4-->xarelto.  . Polyp of sigmoid colon     Past Surgical History:  Procedure Laterality Date  . ANKLE SURGERY    . CARDIAC CATHETERIZATION  05/2010   [facility]  . CARDIAC CATHETERIZATION     [facility]  . CARDIAC CATHETERIZATION     [facility]  . CARDIAC CATHETERIZATION  07/2014   [facility]  . COLONOSCOPY    . COLONOSCOPY WITH PROPOFOL N/A 06/26/2018   Procedure: COLONOSCOPY WITH PROPOFOL;  Surgeon: Virgel Manifold, MD;  Location: [facility] ENDOSCOPY;  Service: Endoscopy;  Laterality: N/A;  . CORONARY STENT INTERVENTION N/A 03/07/2019   Procedure: CORONARY STENT INTERVENTION;  Surgeon: Nelva Bush, MD;  Location: [place] [facility];  Service: Cardiovascular;  Laterality: N/A;  . ENTEROSCOPY N/A 08/05/2018   Procedure: ENTEROSCOPY;  Surgeon: Virgel Manifold, MD;  Location: [facility]COPY;  Service: Endoscopy;  Laterality: N/A;  . ESOPHAGOGASTRODUODENOSCOPY (EGD) WITH PROPOFOL N/A 06/26/2018   Procedure: ESOPHAGOGASTRODUODENOSCOPY (EGD)  WITH PROPOFOL;  Surgeon: Virgel Manifold, MD;  Location: [facility]OSCOPY;  Service: Endoscopy;  Laterality: N/A;  . LEFT HEART CATH AND CORONARY ANGIOGRAPHY N/A 03/07/2019   Procedure: LEFT HEART CATH AND CORONARY ANGIOGRAPHY;  Surgeon: Nelva Bush, MD;  Location: [place] [facility];  Service: Cardiovascular;  Laterality: N/A;  . TESTICLE SURGERY  70's  . TUMOR EXCISION     Neck and finger; benign  . WRIST SURGERY      Current Medications: Current Meds   Medication Sig  . ACCU-CHEK GUIDE test strip   . albuterol (VENTOLIN HFA) 108 (90 Base) MCG/ACT inhaler Inhale 2 puffs into the lungs every 4 (four) hours as needed for wheezing or shortness of breath.  Marland Kitchen atorvastatin (LIPITOR) 80 MG tablet TAKE 1 TABLET(80 MG) BY MOUTH DAILY  . budesonide-formoterol (SYMBICORT) 160-4.5 MCG/ACT inhaler Inhale 2 puffs into the lungs 2 (two) times daily.  . clotrimazole-betamethasone (LOTRISONE) cream Apply 1 application topically 2 (two) times daily.  Marland Kitchen diltiazem (CARDIZEM CD) 180 MG 24 hr capsule Take 1 capsule (180 mg total) by mouth daily.  . furosemide (LASIX) 40 MG tablet Take 0.5 tablets (20 mg total) by mouth 2 (two) times daily.  Marland Kitchen gabapentin (NEURONTIN) 600 MG tablet TAKE 2 TABLETS(1200 MG) BY MOUTH AT BEDTIME  . isosorbide mononitrate (IMDUR) 60 MG 24 hr tablet TAKE 1 TABLET(60 MG) BY MOUTH DAILY  . metFORMIN (GLUCOPHAGE-XR) 500 MG 24 hr tablet TAKE 2 TABLETS(1000 MG) BY MOUTH TWICE DAILY  . metoprolol succinate (TOPROL-XL) 50 MG 24 hr tablet Take 1 tablet (50 mg total) by mouth daily. Take with or immediately following a meal.  . nitroGLYCERIN (NITROSTAT) 0.4 MG SL tablet Place 1 tablet (0.4 mg total) under the tongue every 5 (five) minutes as needed for chest pain.  . pantoprazole (PROTONIX) 40 MG tablet TAKE 1 TABLET(40 MG) BY MOUTH DAILY  . ramipril (ALTACE) 10 MG capsule Take 1 capsule (10 mg total) by mouth daily.  . rivaroxaban (XARELTO) 20 MG TABS tablet TAKE 1 TABLET(20 MG) BY MOUTH DAILY WITH SUPPER  . [DISCONTINUED] clopidogrel (PLAVIX) 75 MG tablet TAKE 1 TABLET(75 MG) BY MOUTH DAILY WITH BREAKFAST  . [DISCONTINUED] metoprolol succinate (TOPROL-XL) 25 MG 24 hr tablet Take 1 tablet (25 mg total) by mouth daily.     Allergies:   Doxycycline   Social History   Socioeconomic History  . Marital status: Widowed    Spouse name: Not on file  . Number of children: Not on file  . Years of education: Not on file  . Highest education level:  8th grade  Occupational History  . Occupation: part time   Tobacco Use  . Smoking status: Former Smoker    Packs/day: 4.00    Years: 28.00    Pack years: 112.00    Types: Cigarettes    Quit date: 08/25/1993    Years since quitting: 27.5  . Smokeless tobacco: Never Used  Vaping Use  . Vaping Use: Never used  Substance and Sexual Activity  . Alcohol use: Not Currently    Comment: rarely, occasional beer   . Drug use: No  . Sexual activity: Not on file  Other Topics Concern  . Not on file  Social History Narrative   No regular exercise.   Social Determinants of Health   Financial Resource Strain: Low Risk   . Difficulty of Paying Living Expenses: Not hard at all  Food Insecurity: No Food Insecurity  . Worried About Charity fundraiser in  the Last Year: Never true  . Ran Out of Food in the Last Year: Never true  Transportation Needs: No Transportation Needs  . Lack of Transportation (Medical): No  . Lack of Transportation (Non-Medical): No  Physical Activity: Inactive  . Days of Exercise per Week: 0 days  . Minutes of Exercise per Session: 0 min  Stress: No Stress Concern Present  . Feeling of Stress : Not at all  Social Connections: Socially Isolated  . Frequency of Communication with Friends and Family: More than three times a week  . Frequency of Social Gatherings with Friends and Family: More than three times a week  . Attends Religious Services: Never  . Active Member of Clubs or Organizations: No  . Attends Archivist Meetings: Never  . Marital Status: Widowed     Family History: The patient's family history includes Alcohol abuse in an other family member; Cancer in his brother; Coronary artery disease in an other family member; Depression in an other family member; Diabetes in his mother and another family member; Heart disease in his mother; Hyperlipidemia in his mother and another family member; Hypertension in his mother and another family member;  Kidney Stones in his brother; Pneumonia in his father.  ROS:   Please see the history of present illness.    All other systems reviewed and are negative.  EKGs/Labs/Other Studies Reviewed:    The following studies were reviewed today: Cardiac cath 02/2019 CORONARY STENT INTERVENTION  LEFT HEART CATH AND CORONARY ANGIOGRAPHY    Conclusion  Conclusions: 1. Severe single-vessel CAD with 80% mid LAD stenosis. 2. Non-obstructive LMCA, LCx, and RCA disease. 3. Upper normal left ventricular filling pressure with normal ejection fraction. 4. Mild to moderate mitral regurgitation in the setting of catheter-induced non-sustained ventricular tachycardia. 5. Successful PCI to mid LAD using Synergy 2.5 x 24 mm drug-eluting stent (post-dilated to 2.8 mm) with 0% residual stenosis and TIMI-3 flow.  Recommendations: 1. Overnight monitoring. 2. Obtain echo to better evaluate mitral regurgitation noted on left ventriculogram. 3. Restart heparin infusion 2 hours after TR band removal. 4. If no evidence of bleeding/vascular injury, recommend restarting rivaroxaban tomorrow and continuing rivaroxaban 20 mg daily and clopidogrel 75 mg daily x 12 months. 5. Aggressive secondary prevention.  Nelva Bush, MD [facility] Pager: [phone]   Coronary Diagrams   Diagnostic Dominance: Right    Intervention        Echo 02/2019 1. The left ventricle has normal systolic function, with an ejection  fraction of 55-60%. The cavity size was normal. There is moderately  increased left ventricular wall thickness. Left ventricular diastolic  function could not be evaluated secondary to  atrial fibrillation.  2. The right ventricle has normal systolic function. The cavity was  normal. There is no increase in right ventricular wall thickness.  3. Left atrial size was mild-moderately dilated.  4. The aortic valve was not well visualized Mild thickening of the aortic  valve. Mild to  moderate aortic annular calcification noted.  5. The mitral valve was not well visualized. There is mild mitral annular  calcification present.  6. The interatrial septum was not well visualized.   EKG:  EKG is  ordered today.  The ekg ordered today demonstrates Afib 69bpm, nonsspecific ST/T wave abnormality  Recent Labs: 03/17/2021: ALT 13; BUN 15; Creatinine, Ser 1.15; Hemoglobin 10.7; Platelets 200; Potassium 4.2; Sodium 141; TSH 3.080  Recent Lipid Panel    Component Value Date/Time   CHOL 73 (L)  03/17/2021 1005   CHOL 195 08/01/2017 0902   CHOL 104 07/25/2014 0425   TRIG 170 (H) 03/17/2021 1005   TRIG 268 (H) 08/01/2017 0902   TRIG 243 (H) 07/25/2014 0425   TRIG 249 05/27/2010 0000   HDL 26 (L) 03/17/2021 1005   HDL 25 (L) 07/25/2014 0425   CHOLHDL 2.8 03/17/2021 1005   VLDL 54 (H) 08/01/2017 0902   VLDL 49 (H) 07/25/2014 0425   LDLCALC 19 03/17/2021 1005   LDLCALC 30 07/25/2014 0425     Physical Exam:    VS:  BP (!) 162/80 (BP Location: Left Arm, Patient Position: Sitting, Cuff Size: Normal)   Pulse 69   Ht 5\' 10"  (1.778 m)   Wt 267 lb 8 oz (121.3 kg)   SpO2 97%   BMI 38.38 kg/m     Wt Readings from Last 3 Encounters:  03/18/21 267 lb 8 oz (121.3 kg)  03/11/21 269 lb 3.2 oz (122.1 kg)  02/14/21 260 lb (117.9 kg)     SHF:WYOV nourished, well developed in no acute distress HEENT: Normal NECK: No JVD; No carotid bruits LYMPHATICS: No lymphadenopathy CARDIAC: Irreg Irreg, no murmurs, rubs, gallops RESPIRATORY:  Clear to auscultation without rales, wheezing or rhonchi  ABDOMEN: Soft, non-tender, non-distended MUSCULOSKELETAL:  No edema; No deformity  SKIN: Warm and dry NEUROLOGIC:  Alert and oriented x 3 PSYCHIATRIC:  Normal affect   ASSESSMENT:    1. Coronary artery disease involving native coronary artery of native heart with angina pectoris ([place])   2. Chronic diastolic heart failure ([place])   3. Essential hypertension   4. Hyperlipidemia, mixed   5.  Type 2 diabetes mellitus with complication, without long-term current use of insulin (HCC)   6. Stage 3 chronic kidney disease, unspecified whether stage 3a or 3b CKD (HCC)    PLAN:    In order of problems listed above:  CAD s/p DESx1 to [place] in 02/2019 Patient denies anginal symptoms. He has chronic sob from COPD that is worse since weight gain. Weight is up 20-30lbs since we have last seen him. Lifestyle changes discussed. Will send in rx for SL NTG. On plavix and Xarelto. Has been at least a year since his stent, stop plavix. Continue BB and statin.  Hypertension Elevated today and last week at PCPs. Does report occasional orthostatic symptoms although orthostatics are negative. He is on diltiazem 180 mg daily, Metoprolol 25mg  daily, Ramipril 10mg  daily, Imdur 60 mg daily. HR tuns upper 60s. Increase metoprolol 50mg  daily. Recommended he obtain a bp cuff and record daily bps. We will see him back in 2 weeks for bp check. Suspect he will need further titration of medications, can increase Imdur at follow-up.   Permanent A. Fib Rate controlled on metoprolol 25 mg daily. Increase BB as above. Continue Xarelto.   Diabetes type 2 Followed by PCP, patient was just seen last week.   CKD stage III Most recent labs showed stable creatinine  Hyperlipidemia LDL 19 on recent labs. Continue statin.   Disposition: Follow up in 2 week(s) with MD/APP    Signed, Arieon Scalzo Ninfa Meeker, PA-C  03/18/2021 10:46 AM    [place]

## 2021-03-18 ENCOUNTER — Ambulatory Visit: Payer: Medicare HMO | Admitting: Medical

## 2021-03-18 ENCOUNTER — Encounter: Payer: Self-pay | Admitting: Medical

## 2021-03-18 VITALS — BP 162/80 | HR 69 | Ht 70.0 in | Wt 267.5 lb

## 2021-03-18 DIAGNOSIS — I5032 Chronic diastolic (congestive) heart failure: Secondary | ICD-10-CM | POA: Diagnosis not present

## 2021-03-18 DIAGNOSIS — E118 Type 2 diabetes mellitus with unspecified complications: Secondary | ICD-10-CM

## 2021-03-18 DIAGNOSIS — N183 Chronic kidney disease, stage 3 unspecified: Secondary | ICD-10-CM | POA: Diagnosis not present

## 2021-03-18 DIAGNOSIS — E782 Mixed hyperlipidemia: Secondary | ICD-10-CM | POA: Diagnosis not present

## 2021-03-18 DIAGNOSIS — I10 Essential (primary) hypertension: Secondary | ICD-10-CM | POA: Diagnosis not present

## 2021-03-18 DIAGNOSIS — I25119 Atherosclerotic heart disease of native coronary artery with unspecified angina pectoris: Secondary | ICD-10-CM

## 2021-03-18 LAB — COMPREHENSIVE METABOLIC PANEL
ALT: 13 IU/L (ref 0–44)
AST: 12 IU/L (ref 0–40)
Albumin/Globulin Ratio: 2 (ref 1.2–2.2)
Albumin: 4.5 g/dL (ref 3.8–4.8)
Alkaline Phosphatase: 81 IU/L (ref 44–121)
BUN/Creatinine Ratio: 13 (ref 10–24)
BUN: 15 mg/dL (ref 8–27)
Bilirubin Total: 1.2 mg/dL (ref 0.0–1.2)
CO2: 22 mmol/L (ref 20–29)
Calcium: 9.4 mg/dL (ref 8.6–10.2)
Chloride: 101 mmol/L (ref 96–106)
Creatinine, Ser: 1.15 mg/dL (ref 0.76–1.27)
Globulin, Total: 2.2 g/dL (ref 1.5–4.5)
Glucose: 147 mg/dL — ABNORMAL HIGH (ref 65–99)
Potassium: 4.2 mmol/L (ref 3.5–5.2)
Sodium: 141 mmol/L (ref 134–144)
Total Protein: 6.7 g/dL (ref 6.0–8.5)
eGFR: 68 mL/min/{1.73_m2} (ref >59)

## 2021-03-18 LAB — LIPID PANEL
Chol/HDL Ratio: 2.8 ratio (ref 0.0–5.0)
Cholesterol, Total: 73 mg/dL — ABNORMAL LOW (ref 100–199)
HDL: 26 mg/dL — ABNORMAL LOW (ref >39)
LDL Chol Calc (NIH): 19 mg/dL (ref 0–99)
Triglycerides: 170 mg/dL — ABNORMAL HIGH (ref 0–149)
VLDL Cholesterol Cal: 28 mg/dL (ref 5–40)

## 2021-03-18 LAB — THYROID PANEL WITH TSH
Free Thyroxine Index: 2.5 (ref 1.2–4.9)
T3 Uptake Ratio: 31 % (ref 24–39)
T4, Total: 8.2 ug/dL (ref 4.5–12.0)
TSH: 3.08 u[IU]/mL (ref 0.450–4.500)

## 2021-03-18 LAB — CBC WITH DIFFERENTIAL/PLATELET
Basophils Absolute: 0 10*3/uL (ref 0.0–0.2)
Basos: 1 %
EOS (ABSOLUTE): 0.1 10*3/uL (ref 0.0–0.4)
Eos: 2 %
Hematocrit: 35.7 % — ABNORMAL LOW (ref 37.5–51.0)
Hemoglobin: 10.7 g/dL — ABNORMAL LOW (ref 13.0–17.7)
Immature Grans (Abs): 0 10*3/uL (ref 0.0–0.1)
Immature Granulocytes: 0 %
Lymphocytes Absolute: 2.3 10*3/uL (ref 0.7–3.1)
Lymphs: 33 %
MCH: 24 pg — ABNORMAL LOW (ref 26.6–33.0)
MCHC: 30 g/dL — ABNORMAL LOW (ref 31.5–35.7)
MCV: 80 fL (ref 79–97)
Monocytes Absolute: 0.5 10*3/uL (ref 0.1–0.9)
Monocytes: 7 %
Neutrophils Absolute: 3.9 10*3/uL (ref 1.4–7.0)
Neutrophils: 57 %
Platelets: 200 10*3/uL (ref 150–450)
RBC: 4.45 x10E6/uL (ref 4.14–5.80)
RDW: 15 % (ref 11.6–15.4)
WBC: 6.8 10*3/uL (ref 3.4–10.8)

## 2021-03-18 LAB — PSA TOTAL+% FREE (SERIAL)
PSA, Free Pct: 30 %
PSA, Free: 0.27 ng/mL
Prostate Specific Ag, Serum: 0.9 ng/mL (ref 0.0–4.0)

## 2021-03-18 MED ORDER — METOPROLOL SUCCINATE ER 50 MG PO TB24: 50.0000 mg | ORAL_TABLET | Freq: Every day | ORAL | 3 refills | Status: DC

## 2021-03-18 MED ORDER — NITROGLYCERIN 0.4 MG SL SUBL: 0.4000 mg | SUBLINGUAL_TABLET | SUBLINGUAL | 2 refills | Status: DC | PRN

## 2021-03-18 NOTE — Patient Instructions (Addendum)
Medication Instructions:  Your physician has recommended you make the following change in your medication:   1. INCREASE Metoprolol succinate to 50 mg once daily 2. STOP Plavix (Clopedigral)   *If you need a refill on your cardiac medications before your next appointment, please call your pharmacy*   Lab Work: None  If you have labs (blood work) drawn today and your tests are completely normal, you will receive your results only by: Marland Kitchen MyChart Message (if you have MyChart) OR . A paper copy in the mail If you have any lab test that is abnormal or we need to change your treatment, we will call you to review the results.   Testing/Procedures: None    Follow-Up: At [facility], you and your health needs are our priority.  As part of our continuing mission to provide you with exceptional heart care, we have created designated Provider Care Teams.  These Care Teams include your primary Cardiologist (physician) and Advanced Practice Providers (APPs -  Physician Assistants and Nurse Practitioners) who all work together to provide you with the care you need, when you need it.  We recommend signing up for the patient portal called "MyChart".  Sign up information is provided on this After Visit Summary.  MyChart is used to connect with patients for Virtual Visits (Telemedicine).  Patients are able to view lab/test results, encounter notes, upcoming appointments, etc.  Non-urgent messages can be sent to your provider as well.   To learn more about what you can do with MyChart, go to [email or web address].    Your next appointment:   2 week(s)  The format for your next appointment:   In Person  Provider:   Ida Rogue, MD or [name]   Other Instructions Your physician has requested that you regularly monitor and record your blood pressure readings at home. Please use the same machine at the same time of day to check your readings and record them to bring to your  follow-up visit.  Please monitor blood pressures and keep a log of your readings.   Make sure to check 2 hours after your medications.   AVOID these things for 30 minutes before checking your blood pressure:  No Drinking caffeine.  No Drinking alcohol.  No Eating.  No Smoking.  No Exercising.  Five minutes before checking your blood pressure:  Pee.  Sit in a dining chair. Avoid sitting in a soft couch or armchair.  Be quiet. Do not talk.

## 2021-03-24 ENCOUNTER — Ambulatory Visit (INDEPENDENT_AMBULATORY_CARE_PROVIDER_SITE_OTHER): Payer: Medicare HMO | Admitting: Internal Medicine

## 2021-03-24 ENCOUNTER — Encounter: Payer: Self-pay | Admitting: Internal Medicine

## 2021-03-24 DIAGNOSIS — D649 Anemia, unspecified: Secondary | ICD-10-CM

## 2021-03-24 DIAGNOSIS — E119 Type 2 diabetes mellitus without complications: Secondary | ICD-10-CM

## 2021-03-24 MED ORDER — DAPAGLIFLOZIN PROPANEDIOL 5 MG PO TABS: 5.0000 mg | ORAL_TABLET | Freq: Every day | ORAL | 5 refills | Status: DC

## 2021-03-24 NOTE — Progress Notes (Addendum)
There were no vitals taken for this visit.   Subjective:    Patient ID: Ernest [name], male    DOB: 1951-05-17, 70 y.o.   MRN: [id number]  HPI: Ernest [name] is a 70 y.o. male  I connected with [name] on 03/24/21 by a video enabled telemedicine application and verified that I am speaking with the correct person using two identifiers.  I discussed the limitations of evaluation and management by telemedicine. The patient expressed understanding and agreed to proceed. "I discussed the limitations of evaluation and management by telemedicine and the availability of in person appointments. The patient expressed understanding and agreed to proceed"   I connected with [name] on 04/05/21 by a video enabled telemedicine application and verified that I am speaking with the correct person using two identifiers.  I discussed the limitations of evaluation and management by telemedicine. The patient expressed understanding and agreed to proceed. "I discussed the limitations of evaluation and management by telemedicine and the availability of in person appointments. The patient expressed understanding and agreed to proceed"     This visit was completed via telephone due to the restrictions of the COVID-19 pandemic. All issues as above were discussed and addressed but no physical exam was performed. If it was felt that the patient should be evaluated in the office, they were directed there. The patient verbally consented to this visit. Patient was unable to complete an audio/visual visit due to Technical difficulties , oLack of internet. Due to the catastrophic nature of the COVID-19 pandemic, this visit was done through audio contact only. Location of the patient: home Location of the provider: home  Those involved with this call:  Provider: [name], MD CMA: Louanna Raw, [place] Desk/Registration: Jill Side  Time spent on call: > 15 mins time  was spent in conference. More than 50% of this time was spent in counseling and coordination of care. 10  minutes total spent in review of patient's record and preparation of their chart.      Hypertension This is a chronic (140/83 mm hg ,142/80 mm hg ) problem. The current episode started more than 1 year ago. Pertinent negatives include no chest pain, headaches, palpitations or shortness of breath.  Diabetes He presents for his follow-up (fsbs 120 per pt -- 150 - 120 ) diabetic visit. He has type 2 diabetes mellitus. Pertinent negatives for hypoglycemia include no confusion, dizziness, headaches, speech difficulty or tremors. Pertinent negatives for diabetes include no chest pain, no fatigue, no polydipsia, no polyphagia, no polyuria and no weakness.  Anemia Presents for initial (per pt has had anemia in the past had a cscope last year and had a bleed - and was in the hospital ) visit. There has been no abdominal pain, bruising/bleeding easily, confusion, fever, light-headedness or palpitations.    Chief Complaint  Patient presents with  . Hypertension  . Chronic Afib  . restless leg  . Diabetes    Relevant past medical, surgical, family and social history reviewed and updated as indicated. Interim medical history since our last visit reviewed. Allergies and medications reviewed and updated.  Review of Systems  Constitutional: Negative for activity change, appetite change, chills, fatigue and fever.  HENT: Negative for congestion, ear discharge, ear pain and facial swelling.   Eyes: Negative for pain, discharge and itching.  Respiratory: Negative for cough, chest tightness, shortness of breath and wheezing.   Cardiovascular: Negative for chest pain, palpitations and leg swelling.  Gastrointestinal: Negative for abdominal distention, abdominal pain, blood in stool, constipation, diarrhea, nausea and vomiting.  Endocrine: Negative for cold intolerance, heat intolerance, polydipsia,  polyphagia and polyuria.  Genitourinary: Negative for difficulty urinating, dysuria, flank pain, frequency, hematuria and urgency.  Musculoskeletal: Negative for arthralgias, gait problem, joint swelling and myalgias.  Skin: Negative for color change, rash and wound.  Neurological: Negative for dizziness, tremors, speech difficulty, weakness, light-headedness, numbness and headaches.  Hematological: Does not bruise/bleed easily.  Psychiatric/Behavioral: Negative for agitation, confusion, decreased concentration, sleep disturbance and suicidal ideas.    Per HPI unless specifically indicated above     Objective:    There were no vitals taken for this visit.  Wt Readings from Last 3 Encounters:  03/18/21 267 lb 8 oz (121.3 kg)  03/11/21 269 lb 3.2 oz (122.1 kg)  02/14/21 260 lb (117.9 kg)    Physical Exam  Results for orders placed or performed in visit on 03/17/21  Microalbumin, Urine Waived  Result Value Ref Range   Microalb, Ur Waived 80 (H) 0 - 19 mg/L   Creatinine, Urine Waived 100 10 - 300 mg/dL   Microalb/Creat Ratio 30-300 (H) <30 mg/g  Lipid panel  Result Value Ref Range   Cholesterol, Total 73 (L) 100 - 199 mg/dL   Triglycerides 170 (H) 0 - 149 mg/dL   HDL 26 (L) >39 mg/dL   VLDL Cholesterol Cal 28 5 - 40 mg/dL   LDL Chol Calc (NIH) 19 0 - 99 mg/dL   Chol/HDL Ratio 2.8 0.0 - 5.0 ratio  Bayer DCA Hb A1c Waived  Result Value Ref Range   HB A1C (BAYER DCA - WAIVED) 6.5 <7.0 %  CBC with Differential/Platelet  Result Value Ref Range   WBC 6.8 3.4 - 10.8 x10E3/uL   RBC 4.45 4.14 - 5.80 x10E6/uL   Hemoglobin 10.7 (L) 13.0 - 17.7 g/dL   Hematocrit 35.7 (L) 37.5 - 51.0 %   MCV 80 79 - 97 fL   MCH 24.0 (L) 26.6 - 33.0 pg   MCHC 30.0 (L) 31.5 - 35.7 g/dL   RDW 15.0 11.6 - 15.4 %   Platelets 200 150 - 450 x10E3/uL   Neutrophils 57 Not Estab. %   Lymphs 33 Not Estab. %   Monocytes 7 Not Estab. %   Eos 2 Not Estab. %   Basos 1 Not Estab. %   Neutrophils Absolute 3.9  1.4 - 7.0 x10E3/uL   Lymphocytes Absolute 2.3 0.7 - 3.1 x10E3/uL   Monocytes Absolute 0.5 0.1 - 0.9 x10E3/uL   EOS (ABSOLUTE) 0.1 0.0 - 0.4 x10E3/uL   Basophils Absolute 0.0 0.0 - 0.2 x10E3/uL   Immature Granulocytes 0 Not Estab. %   Immature Grans (Abs) 0.0 0.0 - 0.1 x10E3/uL  Comprehensive metabolic panel  Result Value Ref Range   Glucose 147 (H) 65 - 99 mg/dL   BUN 15 8 - 27 mg/dL   Creatinine, Ser 1.15 0.76 - 1.27 mg/dL   eGFR 68 >59 mL/min/1.73   BUN/Creatinine Ratio 13 10 - 24   Sodium 141 134 - 144 mmol/L   Potassium 4.2 3.5 - 5.2 mmol/L   Chloride 101 96 - 106 mmol/L   CO2 22 20 - 29 mmol/L   Calcium 9.4 8.6 - 10.2 mg/dL   Total Protein 6.7 6.0 - 8.5 g/dL   Albumin 4.5 3.8 - 4.8 g/dL   Globulin, Total 2.2 1.5 - 4.5 g/dL   Albumin/Globulin Ratio 2.0 1.2 - 2.2   Bilirubin Total 1.2 0.0 -  1.2 mg/dL   Alkaline Phosphatase 81 44 - 121 IU/L   AST 12 0 - 40 IU/L   ALT 13 0 - 44 IU/L  PSA Total+%Free (Serial)  Result Value Ref Range   Prostate Specific Ag, Serum 0.9 0.0 - 4.0 ng/mL   PSA, Free 0.27 N/A ng/mL   PSA, Free Pct 30.0 %  Thyroid Panel With TSH  Result Value Ref Range   TSH 3.080 0.450 - 4.500 uIU/mL   T4, Total 8.2 4.5 - 12.0 ug/dL   T3 Uptake Ratio 31 24 - 39 %   Free Thyroxine Index 2.5 1.2 - 4.9        Current Outpatient Medications:  .  ACCU-CHEK GUIDE test strip, , Disp: , Rfl:  .  albuterol (VENTOLIN HFA) 108 (90 Base) MCG/ACT inhaler, Inhale 2 puffs into the lungs every 4 (four) hours as needed for wheezing or shortness of breath., Disp: 18 g, Rfl: 6 .  atorvastatin (LIPITOR) 80 MG tablet, TAKE 1 TABLET(80 MG) BY MOUTH DAILY, Disp: 90 tablet, Rfl: 1 .  budesonide-formoterol (SYMBICORT) 160-4.5 MCG/ACT inhaler, Inhale 2 puffs into the lungs 2 (two) times daily., Disp: 1 each, Rfl: 12 .  clotrimazole-betamethasone (LOTRISONE) cream, Apply 1 application topically 2 (two) times daily., Disp: 30 g, Rfl: 2 .  diltiazem (CARDIZEM CD) 180 MG 24 hr capsule,  Take 1 capsule (180 mg total) by mouth daily., Disp: 90 capsule, Rfl: 3 .  furosemide (LASIX) 40 MG tablet, Take 0.5 tablets (20 mg total) by mouth 2 (two) times daily., Disp: 180 tablet, Rfl: 1 .  gabapentin (NEURONTIN) 600 MG tablet, TAKE 2 TABLETS(1200 MG) BY MOUTH AT BEDTIME, Disp: 180 tablet, Rfl: 0 .  isosorbide mononitrate (IMDUR) 60 MG 24 hr tablet, TAKE 1 TABLET(60 MG) BY MOUTH DAILY, Disp: 30 tablet, Rfl: 3 .  metFORMIN (GLUCOPHAGE-XR) 500 MG 24 hr tablet, TAKE 2 TABLETS(1000 MG) BY MOUTH TWICE DAILY, Disp: 360 tablet, Rfl: 1 .  metoprolol succinate (TOPROL-XL) 50 MG 24 hr tablet, Take 1 tablet (50 mg total) by mouth daily. Take with or immediately following a meal., Disp: 90 tablet, Rfl: 3 .  nitroGLYCERIN (NITROSTAT) 0.4 MG SL tablet, Place 1 tablet (0.4 mg total) under the tongue every 5 (five) minutes as needed for chest pain., Disp: 25 tablet, Rfl: 2 .  pantoprazole (PROTONIX) 40 MG tablet, TAKE 1 TABLET(40 MG) BY MOUTH DAILY, Disp: 90 tablet, Rfl: 2 .  ramipril (ALTACE) 10 MG capsule, Take 1 capsule (10 mg total) by mouth daily., Disp: 90 capsule, Rfl: 0 .  rivaroxaban (XARELTO) 20 MG TABS tablet, TAKE 1 TABLET(20 MG) BY MOUTH DAILY WITH SUPPER, Disp: 30 tablet, Rfl: 0    Assessment & Plan:  . DM Stop invokana and glipizide last visit  Continue metformin 1000 mg bid. Add farxiga  check HbA1c,  urine  microalbumin  diabetic diet plan given to pt  adviced regarding hypoglycemia and instructions given to pt today on how to prevent and treat the same if it were to occur. pt acknowledges the plan and voices understanding of the same.  exercise plan given and encouraged.   advice diabetic yearly podiatry, ophthalmology , nutritionist , dental check q 6 months,  2. HTN Metoprolol 50  mg for such  and ramipril 10 mg daily as well as cardizem 180 mg  To fu with cards in [place] - seen them recenty on the 1st  they increased metoprolol to 50 from 25 mg  Continue current meds.  Medication  compliance emphasised. pt advised to keep Bp logs. Pt verbalised understanding of the same. Pt to have a low salt diet . Exercise to reach a goal of at least 150 mins a week.  lifestyle modifications explained and pt understands importance of the above.  3. HLd is on lipitor 80 mg daily recheck FLP, check LFT's work on diet, SE of meds explained to pt. low fat and high fiber diet explained to pt.  4. CAD s/p stenting DES x 1 to LAD 2020  Afib needs to fu with Dr. Rockey Situ - is on xareto , b blockers , cardizem plavix and aspirin  Will need a fu asap. Was also rx lasix pt unaware why ? CHF not been seen in 2 yrs,  Will re- refer.   5. Hb low at 10.7 will refer to Heme onc for such  Will check  FOBT - Fu with me x 3 weeks. 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  Hemoglobin stable then  2020 per d/c summary of the hospitalization   Ref. Range 12/20/2020 11:32 03/17/2021 10:05  Hemoglobin Latest Ref Range: 13.0 - 17.7 g/dL 13.3 10.7 (L)  HCT Latest Ref Range: 37.5 - 51.0 % 42.8 35.7 (L)   Problem List Items Addressed This Visit   None      Follow up plan: No follow-ups on file.

## 2021-03-28 ENCOUNTER — Telehealth: Payer: Self-pay | Admitting: Oncology

## 2021-03-28 ENCOUNTER — Encounter: Payer: Self-pay | Admitting: *Deleted

## 2021-03-28 NOTE — Telephone Encounter (Signed)
Left VM for patient to please return call to schedule appt with Dr. Janese Banks. Referral was placed due to anemia--scheduling as f/u (per MD) because he has seen Dr. Janese Banks in the past.

## 2021-04-04 ENCOUNTER — Ambulatory Visit: Payer: Self-pay | Admitting: Pharmacist

## 2021-04-04 ENCOUNTER — Inpatient Hospital Stay: Payer: Medicare HMO | Attending: Oncology | Admitting: Oncology

## 2021-04-04 ENCOUNTER — Encounter: Payer: Self-pay | Admitting: Oncology

## 2021-04-04 ENCOUNTER — Encounter: Payer: Self-pay | Admitting: Medical

## 2021-04-04 ENCOUNTER — Other Ambulatory Visit: Payer: Self-pay

## 2021-04-04 ENCOUNTER — Ambulatory Visit: Payer: Medicare HMO | Admitting: Medical

## 2021-04-04 ENCOUNTER — Inpatient Hospital Stay: Payer: Medicare HMO

## 2021-04-04 VITALS — BP 130/77 | HR 58 | Temp 98.1°F | Resp 16 | Ht 70.0 in | Wt 270.0 lb

## 2021-04-04 VITALS — BP 150/78 | HR 74 | Ht 70.0 in | Wt 270.5 lb

## 2021-04-04 DIAGNOSIS — E669 Obesity, unspecified: Secondary | ICD-10-CM | POA: Insufficient documentation

## 2021-04-04 DIAGNOSIS — R0602 Shortness of breath: Secondary | ICD-10-CM

## 2021-04-04 DIAGNOSIS — I4891 Unspecified atrial fibrillation: Secondary | ICD-10-CM | POA: Diagnosis not present

## 2021-04-04 DIAGNOSIS — Z7901 Long term (current) use of anticoagulants: Secondary | ICD-10-CM | POA: Diagnosis not present

## 2021-04-04 DIAGNOSIS — K219 Gastro-esophageal reflux disease without esophagitis: Secondary | ICD-10-CM | POA: Insufficient documentation

## 2021-04-04 DIAGNOSIS — Z87891 Personal history of nicotine dependence: Secondary | ICD-10-CM | POA: Diagnosis not present

## 2021-04-04 DIAGNOSIS — I10 Essential (primary) hypertension: Secondary | ICD-10-CM

## 2021-04-04 DIAGNOSIS — D509 Iron deficiency anemia, unspecified: Secondary | ICD-10-CM | POA: Insufficient documentation

## 2021-04-04 DIAGNOSIS — J449 Chronic obstructive pulmonary disease, unspecified: Secondary | ICD-10-CM | POA: Insufficient documentation

## 2021-04-04 DIAGNOSIS — E119 Type 2 diabetes mellitus without complications: Secondary | ICD-10-CM | POA: Diagnosis not present

## 2021-04-04 DIAGNOSIS — I5032 Chronic diastolic (congestive) heart failure: Secondary | ICD-10-CM

## 2021-04-04 DIAGNOSIS — E785 Hyperlipidemia, unspecified: Secondary | ICD-10-CM | POA: Diagnosis not present

## 2021-04-04 DIAGNOSIS — Z79899 Other long term (current) drug therapy: Secondary | ICD-10-CM | POA: Diagnosis not present

## 2021-04-04 DIAGNOSIS — I25119 Atherosclerotic heart disease of native coronary artery with unspecified angina pectoris: Secondary | ICD-10-CM | POA: Diagnosis not present

## 2021-04-04 DIAGNOSIS — Z86010 Personal history of colonic polyps: Secondary | ICD-10-CM | POA: Insufficient documentation

## 2021-04-04 DIAGNOSIS — E782 Mixed hyperlipidemia: Secondary | ICD-10-CM | POA: Diagnosis not present

## 2021-04-04 DIAGNOSIS — N183 Chronic kidney disease, stage 3 unspecified: Secondary | ICD-10-CM | POA: Diagnosis not present

## 2021-04-04 DIAGNOSIS — D649 Anemia, unspecified: Secondary | ICD-10-CM

## 2021-04-04 DIAGNOSIS — I482 Chronic atrial fibrillation, unspecified: Secondary | ICD-10-CM

## 2021-04-04 LAB — CBC WITH DIFFERENTIAL/PLATELET
Abs Immature Granulocytes: 0.01 10*3/uL (ref 0.00–0.07)
Basophils Absolute: 0.1 10*3/uL (ref 0.0–0.1)
Basophils Relative: 1 %
Eosinophils Absolute: 0.2 10*3/uL (ref 0.0–0.5)
Eosinophils Relative: 2 %
HCT: 35.6 % — ABNORMAL LOW (ref 39.0–52.0)
Hemoglobin: 10.6 g/dL — ABNORMAL LOW (ref 13.0–17.0)
Immature Granulocytes: 0 %
Lymphocytes Relative: 32 %
Lymphs Abs: 2.5 10*3/uL (ref 0.7–4.0)
MCH: 23.9 pg — ABNORMAL LOW (ref 26.0–34.0)
MCHC: 29.8 g/dL — ABNORMAL LOW (ref 30.0–36.0)
MCV: 80.4 fL (ref 80.0–100.0)
Monocytes Absolute: 0.5 10*3/uL (ref 0.1–1.0)
Monocytes Relative: 6 %
Neutro Abs: 4.5 10*3/uL (ref 1.7–7.7)
Neutrophils Relative %: 59 %
Platelets: 204 10*3/uL (ref 150–400)
RBC: 4.43 MIL/uL (ref 4.22–5.81)
RDW: 15 % (ref 11.5–15.5)
WBC: 7.6 10*3/uL (ref 4.0–10.5)
nRBC: 0 % (ref 0.0–0.2)

## 2021-04-04 LAB — FOLATE: Folate: 7.6 ng/mL (ref >5.9)

## 2021-04-04 LAB — COMPREHENSIVE METABOLIC PANEL
ALT: 15 U/L (ref 0–44)
AST: 21 U/L (ref 15–41)
Albumin: 4.1 g/dL (ref 3.5–5.0)
Alkaline Phosphatase: 67 U/L (ref 38–126)
Anion gap: 10 (ref 5–15)
BUN: 17 mg/dL (ref 8–23)
CO2: 26 mmol/L (ref 22–32)
Calcium: 9.1 mg/dL (ref 8.9–10.3)
Chloride: 105 mmol/L (ref 98–111)
Creatinine, Ser: 1.19 mg/dL (ref 0.61–1.24)
GFR, Estimated: >60 mL/min (ref >60)
Glucose, Bld: 154 mg/dL — ABNORMAL HIGH (ref 70–99)
Potassium: 4 mmol/L (ref 3.5–5.1)
Sodium: 141 mmol/L (ref 135–145)
Total Bilirubin: 1.7 mg/dL — ABNORMAL HIGH (ref 0.3–1.2)
Total Protein: 7 g/dL (ref 6.5–8.1)

## 2021-04-04 LAB — IRON AND TIBC
Iron: 33 ug/dL — ABNORMAL LOW (ref 45–182)
Saturation Ratios: 6 % — ABNORMAL LOW (ref 17.9–39.5)
TIBC: 540 ug/dL — ABNORMAL HIGH (ref 250–450)
UIBC: 507 ug/dL

## 2021-04-04 LAB — RETICULOCYTES: RBC.: 4.33 MIL/uL (ref 4.22–5.81)

## 2021-04-04 LAB — VITAMIN B12: Vitamin B-12: 748 pg/mL (ref 180–914)

## 2021-04-04 LAB — FERRITIN: Ferritin: 16 ng/mL — ABNORMAL LOW (ref 24–336)

## 2021-04-04 MED ORDER — CARVEDILOL 25 MG PO TABS: 25.0000 mg | ORAL_TABLET | Freq: Two times a day (BID) | ORAL | 3 refills | Status: DC

## 2021-04-04 NOTE — Progress Notes (Signed)
States that he has no energy ( fatigue).  He can walk to the car and have to rest.  Yesterday he tried to plant a garden and it took him all day because he kept having to have rest spells.  Patient states he is eating and drinking well, bowels working good.

## 2021-04-04 NOTE — Addendum Note (Signed)
Addended by: [name] C on: 04/04/2021 02:20 PM   Modules accepted: Orders

## 2021-04-04 NOTE — Progress Notes (Signed)
Chronic Care Management Pharmacy Note  05/08/2021 Name:  Ernest [name] MRN:  [id number] DOB:  05/22/1951  Subjective: Ernest [name] is an 70 y.o. year old male who is a primary patient of [name], [name], MD.  The CCM team was consulted for assistance with disease management and care coordination needs.    Engaged with patient by telephone for initial visit in response to provider referral for pharmacy case management and/or care coordination services.   Consent to Services:  The patient was given the following information about Chronic Care Management services today, agreed to services, and gave verbal consent: 1. CCM service includes personalized support from designated clinical staff supervised by the primary care provider, including individualized plan of care and coordination with other care providers 2. 24/7 contact phone numbers for assistance for urgent and routine care needs. 3. Service will only be billed when office clinical staff spend 20 minutes or more in a month to coordinate care. 4. Only one practitioner may furnish and bill the service in a calendar month. 5.The patient may stop CCM services at any time (effective at the end of the month) by phone call to the office staff. 6. The patient will be responsible for cost sharing (co-pay) of up to 20% of the service fee (after annual deductible is met). Patient agreed to services and consent obtained.  Patient Care Team: [name], MD as PCP - General Rockey Situ, Kathlene November, MD as PCP - Cardiology (Cardiology) Vanita Ingles, RN as Case Manager (General Practice) Vladimir Faster, [facility] (Pharmacist)  Recent office visits: 03/24/21-[name] (PCP)- Wilder Glade 5 mg , refer hem/onc, refer GI 03/11/21- [name] (PCP)-stop Invokana, glipizide  Recent consult visits: 4/18/22Kathlen Mody, PA-C (cardiology)- change metop to carvedilol 25 mg bid 04/04/21- Janese Banks (hem/Onc)- blood work, reestablish care for anemia 03/18/21- Cardiology, d/c plavix, increase  metoprolol to 50 mg qd  Hospital visits: None in previous 6 months  Objective:  Lab Results  Component Value Date   CREATININE 1.19 04/04/2021   BUN 17 04/04/2021   GFRNONAA >60 04/04/2021   GFRAA 81 11/19/2020   NA 141 04/04/2021   K 4.0 04/04/2021   CALCIUM 9.1 04/04/2021   CO2 26 04/04/2021   GLUCOSE 154 (H) 04/04/2021    Lab Results  Component Value Date/Time   HGBA1C 6.5 03/17/2021 10:19 AM   HGBA1C 6.5 (H) 11/19/2020 11:09 AM   HGBA1C 5.8 (H) 04/12/2020 04:39 PM   HGBA1C >14.0 (H) 10/20/2019 01:41 PM   HGBA1C 7.5 (H) 07/25/2014 04:25 AM   MICROALBUR 80 (H) 03/17/2021 10:19 AM   MICROALBUR 30 (H) 04/12/2020 09:55 AM    Last diabetic Eye exam:  Lab Results  Component Value Date/Time   HMDIABEYEEXA No Retinopathy 04/19/2018 12:00 AM    Last diabetic Foot exam: No results found for: HMDIABFOOTEX   Lab Results  Component Value Date   CHOL 73 (L) 03/17/2021   HDL 26 (L) 03/17/2021   LDLCALC 19 03/17/2021   TRIG 170 (H) 03/17/2021   CHOLHDL 2.8 03/17/2021    Hepatic Function Latest Ref Rng & Units 04/04/2021 03/17/2021 11/19/2020  Total Protein 6.5 - 8.1 g/dL 7.0 6.7 6.5  Albumin 3.5 - 5.0 g/dL 4.1 4.5 4.3  AST 15 - 41 U/L _0 ALT 0 - 44 U/L _1 Alk Phosphatase 38 - 126 U/L 67 81 97  Total Bilirubin 0.3 - 1.2 mg/dL 1.7(H) 1.2 1.0    Lab Results  Component Value Date/Time   TSH  3.080 03/17/2021 10:05 AM   TSH 2.900 12/20/2020 11:32 AM   FREET4 1.36 12/20/2020 11:32 AM    CBC Latest Ref Rng & Units 04/04/2021 03/17/2021 12/20/2020  WBC 4.0 - 10.5 K/uL 7.6 6.8 6.6  Hemoglobin 13.0 - 17.0 g/dL 10.6(L) 10.7(L) 13.3  Hematocrit 39.0 - 52.0 % 35.6(L) 35.7(L) 42.8  Platelets 150 - 400 K/uL 204 200 196    No results found for: VD25OH  Clinical ASCVD: Yes  The ASCVD Risk score Mikey Bussing DC Jr., et al., 2013) failed to calculate for the following reasons:   The patient has a prior MI or stroke diagnosis    Depression screen [facility] 2/9 05/04/2021 03/11/2021  02/14/2021  Decreased Interest 0 0 0  Down, Depressed, Hopeless 0 0 0  PHQ - 2 Score 0 0 0  Altered sleeping - 0 -  Tired, decreased energy - 0 -  Change in appetite - 0 -  Feeling bad or failure about yourself  - 0 -  Trouble concentrating - 0 -  Moving slowly or fidgety/restless - 0 -  Suicidal thoughts - 0 -  PHQ-9 Score - 0 -      CHA2DS2/VAS Stroke Risk Points  Current as of 12 minutes ago     5 >= 2 Points: High Risk  1 - 1.99 Points: Medium Risk  0 Points: Low Risk    No Change      Details    This score determines the patient's risk of having a stroke if the  patient has atrial fibrillation.       Points Metrics  1 Has Congestive Heart Failure:  Yes    Current as of 12 minutes ago  1 Has Vascular Disease:  Yes    Current as of 12 minutes ago  1 Has Hypertension:  Yes    Current as of 12 minutes ago  1 Age:  42    Current as of 12 minutes ago  1 Has Diabetes:  Yes    Current as of 12 minutes ago  0 Had Stroke:  No  Had TIA:  No  Had Thromboembolism:  No    Current as of 12 minutes ago  0 Male:  No    Current as of 12 minutes ago           Social History   Tobacco Use  Smoking Status Former Smoker  . Packs/day: 4.00  . Years: 28.00  . Pack years: 112.00  . Types: Cigarettes  . Quit date: 08/25/1993  . Years since quitting: 27.7  Smokeless Tobacco Never Used   BP Readings from Last 3 Encounters:  05/04/21 125/74  04/18/21 128/67  04/15/21 119/73   Pulse Readings from Last 3 Encounters:  05/04/21 66  04/18/21 61  04/15/21 61   Wt Readings from Last 3 Encounters:  05/04/21 269 lb 12.8 oz (122.4 kg)  04/11/21 264 lb 11.2 oz (120.1 kg)  04/04/21 270 lb (122.5 kg)   BMI Readings from Last 3 Encounters:  05/04/21 40.75 kg/m  04/11/21 37.98 kg/m  04/04/21 38.74 kg/m    Assessment/Interventions: Review of patient past medical history, allergies, medications, health status, including review of consultants reports, laboratory and other test  data, was performed as part of comprehensive evaluation and provision of chronic care management services.   SDOH:  (Social Determinants of Health) assessments and interventions performed: Yes SDOH Interventions   Flowsheet Row Most Recent Value  SDOH Interventions   Financial Strain Interventions Other (Comment)  [patient  assistanc]     SDOH Screenings   Alcohol Screen: Not on file  Depression (PHQ2-9): Low Risk   . PHQ-2 Score: 0  Financial Resource Strain: Low Risk   . Difficulty of Paying Living Expenses: Not hard at all  Food Insecurity: No Food Insecurity  . Worried About Charity fundraiser in the Last Year: Never true  . Ran Out of Food in the Last Year: Never true  Housing: Not on file  Physical Activity: Inactive  . Days of Exercise per Week: 0 days  . Minutes of Exercise per Session: 0 min  Social Connections: Not on file  Stress: No Stress Concern Present  . Feeling of Stress : Not at all  Tobacco Use: Medium Risk  . Smoking Tobacco Use: Former Smoker  . Smokeless Tobacco Use: Never Used  Transportation Needs: No Transportation Needs  . Lack of Transportation (Medical): No  . Lack of Transportation (Non-Medical): No      Immunization History  Administered Date(s) Administered  . Fluad Quad(high Dose 65+) 11/21/2019, 11/19/2020  . Influenza Split 08/25/2013  . Influenza, High Dose Seasonal PF 09/20/2016, 11/02/2017, 10/10/2018  . Influenza,inj,Quad PF,6+ Mos 11/17/2015  . Influenza-Unspecified 12/23/2014  . PFIZER(Purple Top)SARS-COV-2 Vaccination 11/22/2020, 12/13/2020  . Pneumococcal Conjugate-13 11/25/2015  . Pneumococcal Polysaccharide-23 10/25/2013, 02/03/2019  . Pneumococcal-Unspecified 10/26/2009  . Tdap 11/06/2012  . Zoster 02/04/2013    Conditions to be addressed/monitored:  Hypertension, Hyperlipidemia, Diabetes, Atrial Fibrillation, Heart Failure, Coronary Artery Disease, GERD and COPD  Care Plan : [place]  Updates made by  Vladimir Faster, RPH since 05/08/2021 12:00 AM    Problem: DM2, HTN, CKD, HLD, HF, CAD, Afib, COPD, GERD, Obesity   Priority: High    Long-Range Goal: Disease Management   Start Date: 04/04/2021  This Visit's Progress: Not on track  Priority: High  Note:   Current Barriers:  . Unable to independently afford treatment regimen . Unable to independently monitor therapeutic efficacy . Unable to maintain control of hypertension . Suboptimal therapeutic regimen for diabetes . Does not adhere to prescribed medication regimen . Does not maintain contact with provider office   Pharmacist Clinical Goal(s):  Marland Kitchen Patient will verbalize ability to afford treatment regimen . achieve adherence to monitoring guidelines and medication adherence to achieve therapeutic efficacy . achieve control of hypertension as evidenced by BP readings . maintain control of diabetes as evidenced by A1c and SMBG values  . adhere to plan to optimize therapeutic regimen for diabetes as evidenced by report of adherence to recommended medication management changes . adhere to prescribed medication regimen as evidenced by fill dates & patient report . contact provider office for questions/concerns as evidenced notation of same in electronic health record through collaboration with PharmD and provider.    Interventions: . 1:1 collaboration with [name], MD regarding development and update of comprehensive plan of care as evidenced by provider attestation and co-signature . Inter-disciplinary care team collaboration (see longitudinal plan of care) . Comprehensive medication review performed; medication list updated in electronic medical record .  BP Readings from Last 3 Encounters:  04/04/21 130/77  04/04/21 (!) 150/78  03/18/21 (!) 162/80   Lab Results  Component Value Date   CREATININE 1.19 04/04/2021    HypertensionCKD  (BP goal <130/80) -Not ideally controlled -Current treatment: . Carvedilol 25 mg  bid . Diltiazem er 180 mg qd . Ramipril 10 mg qd . Furosemide 20 mg bid . Isosorbide Mononitrate er 60 mg qd -Medications previously  tried: Toprol xl -Current home readings: 130-150/85 -Current dietary habits: Eats out most days, [place] and [place]  -Current exercise habits: No structured regimen but stays busy around his house -Denies hypotensive/hypertensive symptoms -Educated on BP goals and benefits of medications for prevention of heart attack, stroke and kidney damage; Daily salt intake goal < 2300 mg; Exercise goal of 150 minutes per week; Proper BP monitoring technique; Symptoms of hypotension and importance of maintaining adequate hydration; -Counseled to monitor BP at home daily, document, and provide log at future appointments -Counseled on diet and exercise extensively Recommended to continue current medication Lab Results  Component Value Date   CHOL 73 (L) 03/17/2021   HDL 26 (L) 03/17/2021   LDLCALC 19 03/17/2021   TRIG 170 (H) 03/17/2021   CHOLHDL 2.8 03/17/2021    Hyperlipidemia/ CAD with angina: (LDL goal <70) -Controlled -Current treatment: . Atorvastatin 80 mg qd . SL NTG 0.4 mg prn -Medications previously tried: NA --Educated on Cholesterol goals;  Benefits of statin for ASCVD risk reduction; Exercise goal of 150 minutes per week; -Counseled on diet and exercise extensively Recommended to continue current medication  Lab Results  Component Value Date   HGBA1C 6.5 03/17/2021    Diabetes (A1c goal <7%) -Controlled -Current medications: Marland Kitchen Metformin 1000 xr bid . farxiga 5 mg qd -Medications previously tried: glipizide 5 - hypoglycemia -Current home glucose readings . fasting glucose: 140   -Denies hypoglycemic/hyperglycemic symptoms --Educated on A1c and blood sugar goals; Complications of diabetes including kidney damage, retinal damage, and cardiovascular disease; Exercise goal of 150 minutes per week; Benefits of routine  self-monitoring of blood sugar; Carbohydrate counting and/or plate method -Counseled to check feet daily and get yearly eye exams -Counseled on diet and exercise extensively Recommended to continue current medication Counseled on renal and CV protective benefits of Farxiga. Recommend increasing Farxiga to 10 mg for full renal benefit. Recommend GLP-1 for weight reduction  Atrial Fibrillation (Goal: prevent stroke and major bleeding) -Controlled -CHADSVASC: 5 -Current treatment: . Rate control: Diltiazem CD 180 mg qd, carvedilol 25 mg bid . Anticoagulation: Xarelto 20 mg qd -Medications previously tried: Toprol -Home BP and HR readings: 130-150s/80s  -Counseled on increased risk of stroke due to Afib and benefits of anticoagulation for stroke prevention; importance of adherence to anticoagulant exactly as prescribed; avoidance of NSAIDs due to increased bleeding risk with anticoagulants; importance of regular laboratory monitoring; seeking medical attention after a head injury or if there is blood in the urine/stool; -Counseled on diet and exercise extensively Recommended to continue current medication Assessed patient finances. Patient will return PAP application for Xarelto  COPD (Goal: control symptoms and prevent exacerbations) -Not ideally controlled -Current treatment  . Albuterol . Symbicort -Medications previously tried: NA  -Gold Grade: Gold 1 (FEV1>80%) -Current COPD Classification:  B (high sx, <2 exacerbations/yr) -Pulmonary function testing: 6/19 -Exacerbations requiring treatment in last 6 months: none -Patient reports consistent use of maintenance inhaler -Frequency of rescue inhaler use: several times weekly, More SOB lately-likely multifactoral  -Counseled on Proper inhaler technique; Benefits of consistent maintenance inhaler use When to use rescue inhaler -Counseled on diet and exercise extensively Recommended to continue current medication Assessed  patient finances. Patient to return patient assistance application for Symbicort.  Heart Failure (Goal: manage symptoms and prevent exacerbations) -Not ideally controlled -Last ejection fraction: 55-60% (Date: 2020)  -HF type: Diastolic -NYHA Class: II (slight limitation of activity) -AHA HF Stage: C (Heart disease and symptoms present) -Current treatment: . Carvedilol 25 mg  bid . Furosemide 20 mg bid -Medications previously tried: NA  --Educated on Importance of weighing daily; if you gain more than 3 pounds in one day or 5 pounds in one week, call PCP or cardiology Importance of blood pressure control Recommended patient keep all scheduled cardiology appointments -Counseled on diet and exercise extensively Recommended to continue current medication   Patient Goals/Self-Care Activities . Patient will:  - take medications as prescribed focus on medication adherence by using pill box check glucose fasting and 2 hours after dinner, document, and provide at future appointments check blood pressure daily, document, and provide at future appointments weigh daily, and contact provider if weight gain of 2 lbs overnight or 5 lbs in a week collaborate with provider on medication access solutions target a minimum of 150 minutes of moderate intensity exercise weekly engage in dietary modifications by preparing meals at home 3-5 nights/week  Follow Up Plan: Telephone follow up appointment with care management team member scheduled for:         Medication Assistance: Application for Xarelto, Symbicort   medication assistance program. in process.  Anticipated assistance start date unknown.  See plan of care for additional detail.  Patient's preferred pharmacy is:  [id number] [address], [address] [name] [address] Phone: [phone] Fax: [phone]  [place], [place] [address] [place], [address] [place], [address] [place] [address] Phone: [phone] Fax: [phone]  Uses pill box? No - uses vials Pt endorses 75% compliance  We discussed: Benefits of medication synchronization, packaging and delivery as well as enhanced pharmacist oversight with [employer]. Patient decided to: Continue current medication management strategy  Care Plan and Follow Up Patient Decision:  Patient agrees to Care Plan and Follow-up.  Plan: Telephone follow up appointment with care management team member scheduled for:  1 month CPA,  3 months PharmD  Junita Push. Kenton Kingfisher PharmD, [facility] [phone]

## 2021-04-04 NOTE — Patient Instructions (Addendum)
Medication Instructions:  Your physician has recommended you make the following change in your medication:  1.  STOP Metoprolol 2.  START Carvedilol 25 mg taking 1 tablet twice a day   *If you need a refill on your cardiac medications before your next appointment, please call your pharmacy*   Lab Work: None ordered  If you have labs (blood work) drawn today and your tests are completely normal, you will receive your results only by: Marland Kitchen MyChart Message (if you have MyChart) OR . A paper copy in the mail If you have any lab test that is abnormal or we need to change your treatment, we will call you to review the results.   Testing/Procedures: Your physician has requested that you have an echocardiogram. Echocardiography is a painless test that uses sound waves to create images of your heart. It provides your doctor with information about the size and shape of your heart and how well your heart's chambers and valves are working. This procedure takes approximately one hour. There are no restrictions for this procedure.     Follow-Up: At [facility], you and your health needs are our priority.  As part of our continuing mission to provide you with exceptional heart care, we have created designated Provider Care Teams.  These Care Teams include your primary Cardiologist (physician) and Advanced Practice Providers (APPs -  Physician Assistants and Nurse Practitioners) who all work together to provide you with the care you need, when you need it.  We recommend signing up for the patient portal called "MyChart".  Sign up information is provided on this After Visit Summary.  MyChart is used to connect with patients for Virtual Visits (Telemedicine).  Patients are able to view lab/test results, encounter notes, upcoming appointments, etc.  Non-urgent messages can be sent to your provider as well.   To learn more about what you can do with MyChart, go to [email or web address].    Your next  appointment:   4-6 weeks or after Echocardiogram  The format for your next appointment:   In Person  Provider:   You may see Ida Rogue, MD or one of the following Advanced Practice Providers on your designated Care Team:    Murray Hodgkins, NP  Christell Faith, PA-C  Marrianne Mood, PA-C  Cadence Paris, [place]  Laurann Montana, NP    Other Instructions  Echocardiogram An echocardiogram is a test that uses sound waves (ultrasound) to produce images of the heart. Images from an echocardiogram can provide important information about:  Heart size and shape.  The size and thickness and movement of your heart's walls.  Heart muscle function and strength.  Heart valve function or if you have stenosis. Stenosis is when the heart valves are too narrow.  If blood is flowing backward through the heart valves (regurgitation).  A tumor or infectious growth around the heart valves.  Areas of heart muscle that are not working well because of poor blood flow or injury from a heart attack.  Aneurysm detection. An aneurysm is a weak or damaged part of an artery wall. The wall bulges out from the normal force of blood pumping through the body. Tell a health care provider about:  Any allergies you have.  All medicines you are taking, including vitamins, herbs, eye drops, creams, and over-the-counter medicines.  Any blood disorders you have.  Any surgeries you have had.  Any medical conditions you have.  Whether you are pregnant or may be pregnant. What are the risks? Generally,  this is a safe test. However, problems may occur, including an allergic reaction to dye (contrast) that may be used during the test. What happens before the test? No specific preparation is needed. You may eat and drink normally. What happens during the test?  You will take off your clothes from the waist up and put on a hospital gown.  Electrodes or electrocardiogram (ECG)patches may be placed on  your chest. The electrodes or patches are then connected to a device that monitors your heart rate and rhythm.  You will lie down on a table for an ultrasound exam. A gel will be applied to your chest to help sound waves pass through your skin.  A handheld device, called a transducer, will be pressed against your chest and moved over your heart. The transducer produces sound waves that travel to your heart and bounce back (or "echo" back) to the transducer. These sound waves will be captured in real-time and changed into images of your heart that can be viewed on a video monitor. The images will be recorded on a computer and reviewed by your health care provider.  You may be asked to change positions or hold your breath for a short time. This makes it easier to get different views or better views of your heart.  In some cases, you may receive contrast through an IV in one of your veins. This can improve the quality of the pictures from your heart. The procedure may vary among health care providers and hospitals.   What can I expect after the test? You may return to your normal, everyday life, including diet, activities, and medicines, unless your health care provider tells you not to do that. Follow these instructions at home:  It is up to you to get the results of your test. Ask your health care provider, or the department that is doing the test, when your results will be ready.  Keep all follow-up visits. This is important. Summary  An echocardiogram is a test that uses sound waves (ultrasound) to produce images of the heart.  Images from an echocardiogram can provide important information about the size and shape of your heart, heart muscle function, heart valve function, and other possible heart problems.  You do not need to do anything to prepare before this test. You may eat and drink normally.  After the echocardiogram is completed, you may return to your normal, everyday life, unless  your health care provider tells you not to do that. This information is not intended to replace advice given to you by your health care provider. Make sure you discuss any questions you have with your health care provider. Document Revised: 07/27/2020 Document Reviewed: 07/27/2020 Elsevier Patient Education  2021 [employer].

## 2021-04-04 NOTE — Progress Notes (Signed)
Hematology/Oncology Consult note [facility]  Telephone:([phone] [phone]  Patient Care Team: [name], MD as PCP - General Rockey Situ, Kathlene November, MD as PCP - Cardiology (Cardiology) Vanita Ingles, RN as Case Manager ([place]) Vladimir Faster, [facility] (Pharmacist)   Name of the patient: Ernest [name]  [id number]  1951/11/25   Date of visit: 04/04/21  Diagnosis-history of iron deficiency anemia now referred back for anemia  Chief complaint/ Reason for visit-reestablish follow-up with hematology for anemia  Heme/Onc history: patient is a 70 year old male who was recently seen by GI for iron deficiency anemia. He was also found to have some hemoptysis in March 2019 and is being followed by pulmonary. CT chest did not reveal any significant abnormalities. He is on Xarelto for his A. fib. He has had EGD and colonoscopy in 2013 and 2015. 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 EGD was normal.Colonoscopy on 06/26/2018 showed 3 polyps in the transverse colon and one polyp in the sigmoid colon. They were negative for malignancy. No active bleeding noted. EGD showed salmon-colored mucosa suspicious for short segment Barrett's esophagus. Erythematous mucosa in the antrum. Few gastric polyps. Normal duodenal bulb  Patient had evidence of iron deficiency anemia in the past and received 2 doses of Feraheme in May 2019.  Last seen in January 2020 and here to reestablish follow-up  Interval history-patient currently reports feeling fatigued.  Appetite and weight have remained stable.  He remains active.  Denies any blood loss in his stool or urine.  Denies any dark melanotic stools  ECOG PS- 1 Pain scale- 0   Review of systems- Review of Systems  Constitutional: Positive for malaise/fatigue. Negative for chills, fever and weight loss.  HENT: Negative  for congestion, ear discharge and nosebleeds.   Eyes: Negative for blurred vision.  Respiratory: Negative for cough, hemoptysis, sputum production, shortness of breath and wheezing.   Cardiovascular: Negative for chest pain, palpitations, orthopnea and claudication.  Gastrointestinal: Negative for abdominal pain, blood in stool, constipation, diarrhea, heartburn, melena, nausea and vomiting.  Genitourinary: Negative for dysuria, flank pain, frequency, hematuria and urgency.  Musculoskeletal: Negative for back pain, joint pain and myalgias.  Skin: Negative for rash.  Neurological: Negative for dizziness, tingling, focal weakness, seizures, weakness and headaches.  Endo/Heme/Allergies: Does not bruise/bleed easily.  Psychiatric/Behavioral: Negative for depression and suicidal ideas. The patient does not have insomnia.        Allergies  Allergen Reactions  . Doxycycline Other (See Comments)    Mouth sores      Past Medical History:  Diagnosis Date  . Anemia   . Asthma   . COPD (chronic obstructive pulmonary disease) ([place])   . Diabetes mellitus    Type II  . Early Pulmonary fibrosis ([place])   . GERD (gastroesophageal reflux disease)   . History of echocardiogram    a. 02/2017 Echo: EF 60-65%, no rwma, mild MR, mod dil LA. Nl RV fxn. PASP 23mmHg.  Marland Kitchen Hyperlipidemia   . Hypertension   . Morbid obesity ([place])   . Non-obstructive CAD (coronary artery disease)    a. 2011 Cath: nonobs dzs; b. 07/2014 Cath: LM 30, LAD nl, LCX nl, RCA 20p, 5m.  Marland Kitchen Permanent atrial fibrillation (HCC)    a. CHA2DS2VASc = 4-->xarelto.  . Polyp of sigmoid colon      Past Surgical History:  Procedure Laterality Date  . ANKLE SURGERY    .  CARDIAC CATHETERIZATION  05/2010   [facility]  . CARDIAC CATHETERIZATION     [facility]  . CARDIAC CATHETERIZATION     [facility]  . CARDIAC CATHETERIZATION  07/2014   [facility]  . COLONOSCOPY    . COLONOSCOPY WITH PROPOFOL N/A 06/26/2018   Procedure: COLONOSCOPY WITH PROPOFOL;  Surgeon:  Virgel Manifold, MD;  Location: [facility] ENDOSCOPY;  Service: Endoscopy;  Laterality: N/A;  . CORONARY STENT INTERVENTION N/A 03/07/2019   Procedure: CORONARY STENT INTERVENTION;  Surgeon: Nelva Bush, MD;  Location: [place] [facility];  Service: Cardiovascular;  Laterality: N/A;  . ENTEROSCOPY N/A 08/05/2018   Procedure: ENTEROSCOPY;  Surgeon: Virgel Manifold, MD;  Location: [facility] ENDOSCOPY;  Service: Endoscopy;  Laterality: N/A;  . ESOPHAGOGASTRODUODENOSCOPY (EGD) WITH PROPOFOL N/A 06/26/2018   Procedure: ESOPHAGOGASTRODUODENOSCOPY (EGD) WITH PROPOFOL;  Surgeon: Virgel Manifold, MD;  Location: [facility] ENDOSCOPY;  Service: Endoscopy;  Laterality: N/A;  . LEFT HEART CATH AND CORONARY ANGIOGRAPHY N/A 03/07/2019   Procedure: LEFT HEART CATH AND CORONARY ANGIOGRAPHY;  Surgeon: Nelva Bush, MD;  Location: [place] [facility];  Service: Cardiovascular;  Laterality: N/A;  . TESTICLE SURGERY  70's  . TUMOR EXCISION     Neck and finger; benign  . WRIST SURGERY      Social History   Socioeconomic History  . Marital status: Widowed    Spouse name: Not on file  . Number of children: Not on file  . Years of education: Not on file  . Highest education level: 8th grade  Occupational History  . Occupation: part time   Tobacco Use  . Smoking status: Former Smoker    Packs/day: 4.00    Years: 28.00    Pack years: 112.00    Types: Cigarettes    Quit date: 08/25/1993    Years since quitting: 27.6  . Smokeless tobacco: Never Used  Vaping Use  . Vaping Use: Never used  Substance and Sexual Activity  . Alcohol use: Not on file  . Drug use: No  . Sexual activity: Not Currently  Other Topics Concern  . Not on file  Social History Narrative   No regular exercise.   Social Determinants of Health   Financial Resource Strain: Low Risk   . Difficulty of Paying Living Expenses: Not hard at all  Food Insecurity: No Food Insecurity  . Worried About Charity fundraiser in the Last  Year: Never true  . Ran Out of Food in the Last Year: Never true  Transportation Needs: No Transportation Needs  . Lack of Transportation (Medical): No  . Lack of Transportation (Non-Medical): No  Physical Activity: Inactive  . Days of Exercise per Week: 0 days  . Minutes of Exercise per Session: 0 min  Stress: No Stress Concern Present  . Feeling of Stress : Not at all  Social Connections: Not on file  Intimate Partner Violence: Not on file    Family History  Problem Relation Age of Onset  . Hypertension Mother   . Hyperlipidemia Mother   . Diabetes Mother   . Heart disease Mother        CABG  . Pneumonia Father        rare type  . Diabetes Other   . Depression Other   . Coronary artery disease Other   . Alcohol abuse Other   . Hypertension Other   . Hyperlipidemia Other   . Cancer Brother   . Kidney Stones Brother      Current Outpatient Medications:  .  ACCU-CHEK GUIDE test strip, , Disp: , Rfl:  .  albuterol (VENTOLIN HFA) 108 (90 Base) MCG/ACT inhaler, Inhale 2 puffs into the lungs every 4 (four) hours as needed for wheezing or shortness of breath., Disp: 18 g, Rfl: 6 .  atorvastatin (LIPITOR) 80 MG tablet, TAKE 1 TABLET(80 MG) BY MOUTH DAILY, Disp: 90 tablet, Rfl: 1 .  budesonide-formoterol (SYMBICORT) 160-4.5 MCG/ACT inhaler, Inhale 2 puffs into the lungs 2 (two) times daily., Disp: 1 each, Rfl: 12 .  carvedilol (COREG) 25 MG tablet, Take 1 tablet (25 mg total) by mouth 2 (two) times daily., Disp: 180 tablet, Rfl: 3 .  clotrimazole-betamethasone (LOTRISONE) cream, Apply 1 application topically 2 (two) times daily., Disp: 30 g, Rfl: 2 .  dapagliflozin propanediol (FARXIGA) 5 MG TABS tablet, Take 1 tablet (5 mg total) by mouth daily before breakfast., Disp: 30 tablet, Rfl: 5 .  diltiazem (CARDIZEM CD) 180 MG 24 hr capsule, Take 1 capsule (180 mg total) by mouth daily., Disp: 90 capsule, Rfl: 3 .  furosemide (LASIX) 40 MG tablet, Take 0.5 tablets (20 mg total) by mouth  2 (two) times daily., Disp: 180 tablet, Rfl: 1 .  gabapentin (NEURONTIN) 600 MG tablet, TAKE 2 TABLETS(1200 MG) BY MOUTH AT BEDTIME, Disp: 180 tablet, Rfl: 0 .  isosorbide mononitrate (IMDUR) 60 MG 24 hr tablet, TAKE 1 TABLET(60 MG) BY MOUTH DAILY, Disp: 30 tablet, Rfl: 3 .  metFORMIN (GLUCOPHAGE-XR) 500 MG 24 hr tablet, TAKE 2 TABLETS(1000 MG) BY MOUTH TWICE DAILY, Disp: 360 tablet, Rfl: 1 .  nitroGLYCERIN (NITROSTAT) 0.4 MG SL tablet, Place 1 tablet (0.4 mg total) under the tongue every 5 (five) minutes as needed for chest pain., Disp: 25 tablet, Rfl: 2 .  pantoprazole (PROTONIX) 40 MG tablet, TAKE 1 TABLET(40 MG) BY MOUTH DAILY, Disp: 90 tablet, Rfl: 2 .  ramipril (ALTACE) 10 MG capsule, Take 1 capsule (10 mg total) by mouth daily., Disp: 90 capsule, Rfl: 0 .  rivaroxaban (XARELTO) 20 MG TABS tablet, TAKE 1 TABLET(20 MG) BY MOUTH DAILY WITH SUPPER, Disp: 30 tablet, Rfl: 0  Physical exam:  Vitals:   04/04/21 1057  BP: 130/77  Pulse: (!) 58  Resp: 16  Temp: 98.1 F (36.7 C)  TempSrc: Oral  Weight: 270 lb (122.5 kg)  Height: 5\' 10"  (1.778 m)   Physical Exam Constitutional:      General: He is not in acute distress. Cardiovascular:     Rate and Rhythm: Normal rate and regular rhythm.     Heart sounds: Normal heart sounds.  Pulmonary:     Effort: Pulmonary effort is normal.     Breath sounds: Normal breath sounds.  Abdominal:     General: Bowel sounds are normal.     Palpations: Abdomen is soft.  Lymphadenopathy:     Comments: No palpable cervical, supraclavicular, axillary or inguinal adenopathy   Skin:    General: Skin is warm and dry.  Neurological:     Mental Status: He is alert and oriented to person, place, and time.      CMP Latest Ref Rng & Units 03/17/2021  Glucose 65 - 99 mg/dL 147(H)  BUN 8 - 27 mg/dL 15  Creatinine 0.76 - 1.27 mg/dL 1.15  Sodium 134 - 144 mmol/L 141  Potassium 3.5 - 5.2 mmol/L 4.2  Chloride 96 - 106 mmol/L 101  CO2 20 - 29 mmol/L 22   Calcium 8.6 - 10.2 mg/dL 9.4  Total Protein 6.0 - 8.5 g/dL 6.7  Total Bilirubin 0.0 -  1.2 mg/dL 1.2  Alkaline Phos 44 - 121 IU/L 81  AST 0 - 40 IU/L 12  ALT 0 - 44 IU/L 13   CBC Latest Ref Rng & Units 04/04/2021  WBC 4.0 - 10.5 K/uL 7.6  Hemoglobin 13.0 - 17.0 g/dL 10.6(L)  Hematocrit 39.0 - 52.0 % 35.6(L)  Platelets 150 - 400 K/uL 204     Assessment and plan- Patient is a 70 y.o. male with history of iron deficiency anemia in the past now referred back for anemia  Patient's CBC most recently showed an H&H of 10.7/35.7.  At baseline patient's hemoglobin is between 13-15.  MCV was borderline low at 80.4.  White count and platelets were normal.  Iron studies that were checked in January 2022 showed elevated TIBC of 487 and a ferritin of 35.  B12 and TSH was normal.  Today I will repeat CBC ferritin and iron studies B12 folate haptoglobin reticulocyte count and myeloma panel.  I will see him back in 1 week to discuss results of blood work.  If there is evidence of iron deficiency I will proceed with IV iron at this time given his moderate anemia as well as ongoing fatigue.  Patient has seen Dr. Bonna Gains in the past for GI work-up and if there is evidence of iron deficiency I will send him back to her   Visit Diagnosis 1. Normocytic anemia      Dr. [name], MD, MPH [facility] [phone] 04/04/2021 12:15 PM

## 2021-04-04 NOTE — Progress Notes (Addendum)
Cardiology Office Note:    Date:  04/04/2021   ID:  Ernest [name], DOB 02-Jun-1951, MRN [id number]  PCP:  [name], MD  [facility] Cardiologist:  Ida Rogue, MD  [place] Electrophysiologist:  None   Referring MD: [name], MD   Chief Complaint: f/u BP  History of Present Illness:    Ernest [name] is a 70 y.o. male with a hx of permanent A. fib on Xarelto, obesity, diabetes type 2, hypertension, hyperlipidemia, GERD, COPD, CAD with PCI to the LAD 03/07/2019 presents for blood pressure follow-up.  In 2015 patient had a cath showing nonobstructive CAD.  In 03/07/2019 patient presented with chest pain found to have non-STEMI.  Cath showed severe single-vessel CAD with 80% mid LAD stenosis treated with successful PCI/DES x1.  Nonobstructive LMCA, left circumflex, RCA disease, normal EF, mild to moderate MR in the setting of catheter induced nonsustained VT.  Patient was last seen 03/18/2021 and blood pressure was elevated.  Metoprolol was increased from 25 to 50 mg daily.  Plavix was stopped since it was 1 year post stent.  Today, patient says BP is generally 130-150/70-80s. Denies chest pain. Has stable DOE, but upon further questioning it seems to be worse over the last 5-6 months. He takes lasix, euvolemic on exam. No LLE, orthopnea, pnd. Has COPD, prior smoker.  Patient was seen in 4/7 by PCP. Noted Hgb down to 10.7.In December 2021 it was 13. He is on Xarelto for permanent afib. He was referred to GI and heme/Onc. He sees heme/onc today and has apt with GI in May. He denies BRBPR or dark stools. Wonder if anemia contributing to SOB, also he is generally deconditioned. Echo in 2020 reviewed showing LVEF 55-60%, will update echo today. EKG shows rate controlled afib.   Past Medical History:  Diagnosis Date  . Asthma   . COPD (chronic obstructive pulmonary disease) ([place])   . Diabetes mellitus    Type II  . Early Pulmonary fibrosis ([place])   . GERD  (gastroesophageal reflux disease)   . History of echocardiogram    a. 02/2017 Echo: EF 60-65%, no rwma, mild MR, mod dil LA. Nl RV fxn. PASP 1mmHg.  Marland Kitchen Hyperlipidemia   . Hypertension   . Morbid obesity ([place])   . Non-obstructive CAD (coronary artery disease)    a. 2011 Cath: nonobs dzs; b. 07/2014 Cath: LM 30, LAD nl, LCX nl, RCA 20p, 70m.  Marland Kitchen Permanent atrial fibrillation (HCC)    a. CHA2DS2VASc = 4-->xarelto.  . Polyp of sigmoid colon     Past Surgical History:  Procedure Laterality Date  . ANKLE SURGERY    . CARDIAC CATHETERIZATION  05/2010   [facility]  . CARDIAC CATHETERIZATION     [facility]  . CARD[facility]AC CATHETERIZATION     [facility]  . CARDIAC CATHETERIZATION  07/2014   [facility]  . COLONOSCOPY    . COLONOSCOPY WITH PROPOFOL N/A 06/26/2018   Procedure: COLONOSCOPY WITH PROPOFOL;  Surgeon: Virgel Manifold, MD;  Location: [facility] ENDOSCOPY;  Service: Endoscopy;  Laterality: N/A;  . CORONARY STENT INTERVENTION N/A 03/07/2019   Procedure: CORONARY STENT INTERVENTION;  Surgeon: Nelva Bush, MD;  Location: [place] [facility];  Service: Cardiovascular;  Laterality: N/A;  . ENTEROSCOPY N/A 08/05/2018   Procedure: ENTEROSCOPY;  Surgeon: Virgel Manifold, MD;  Location: [facility];  Service: Endoscopy;  Laterality: N/A;  . ESOPHAGOGASTRODUODENOSCOPY (EGD) WITH PROPOFOL N/A 06/26/2018   Procedure: ESOPHAGOGASTRODUODENOSCOPY (EGD) WITH PROPOFOL;  Surgeon: Virgel Manifold, MD;  Location: [facility]OSCOPY;  Service: Endoscopy;  Laterality: N/A;  . LEFT HEART CATH AND CORONARY ANGIOGRAPHY N/A 03/07/2019   Procedure: LEFT HEART CATH AND CORONARY ANGIOGRAPHY;  Surgeon: Nelva Bush, MD;  Location: [place] [facility];  Service: Cardiovascular;  Laterality: N/A;  . TESTICLE SURGERY  70's  . TUMOR EXCISION     Neck and finger; benign  . WRIST SURGERY      Current Medications: Current Meds  Medication Sig  . ACCU-CHEK GUIDE test strip   . albuterol (VENTOLIN HFA) 108 (90 Base) MCG/ACT inhaler  Inhale 2 puffs into the lungs every 4 (four) hours as needed for wheezing or shortness of breath.  Marland Kitchen atorvastatin (LIPITOR) 80 MG tablet TAKE 1 TABLET(80 MG) BY MOUTH DAILY  . budesonide-formoterol (SYMBICORT) 160-4.5 MCG/ACT inhaler Inhale 2 puffs into the lungs 2 (two) times daily.  . carvedilol (COREG) 25 MG tablet Take 1 tablet (25 mg total) by mouth 2 (two) times daily.  . clotrimazole-betamethasone (LOTRISONE) cream Apply 1 application topically 2 (two) times daily.  . dapagliflozin propanediol (FARXIGA) 5 MG TABS tablet Take 1 tablet (5 mg total) by mouth daily before breakfast.  . diltiazem (CARDIZEM CD) 180 MG 24 hr capsule Take 1 capsule (180 mg total) by mouth daily.  . furosemide (LASIX) 40 MG tablet Take 0.5 tablets (20 mg total) by mouth 2 (two) times daily.  Marland Kitchen gabapentin (NEURONTIN) 600 MG tablet TAKE 2 TABLETS(1200 MG) BY MOUTH AT BEDTIME  . isosorbide mononitrate (IMDUR) 60 MG 24 hr tablet TAKE 1 TABLET(60 MG) BY MOUTH DAILY  . metFORMIN (GLUCOPHAGE-XR) 500 MG 24 hr tablet TAKE 2 TABLETS(1000 MG) BY MOUTH TWICE DAILY  . nitroGLYCERIN (NITROSTAT) 0.4 MG SL tablet Place 1 tablet (0.4 mg total) under the tongue every 5 (five) minutes as needed for chest pain.  . pantoprazole (PROTONIX) 40 MG tablet TAKE 1 TABLET(40 MG) BY MOUTH DAILY  . ramipril (ALTACE) 10 MG capsule Take 1 capsule (10 mg total) by mouth daily.  . rivaroxaban (XARELTO) 20 MG TABS tablet TAKE 1 TABLET(20 MG) BY MOUTH DAILY WITH SUPPER  . [DISCONTINUED] metoprolol succinate (TOPROL-XL) 50 MG 24 hr tablet Take 1 tablet (50 mg total) by mouth daily. Take with or immediately following a meal.     Allergies:   Doxycycline   Social History   Socioeconomic History  . Marital status: Widowed    Spouse name: Not on file  . Number of children: Not on file  . Years of education: Not on file  . Highest education level: 8th grade  Occupational History  . Occupation: part time   Tobacco Use  . Smoking status: Former  Smoker    Packs/day: 4.00    Years: 28.00    Pack years: 112.00    Types: Cigarettes    Quit date: 08/25/1993    Years since quitting: 27.6  . Smokeless tobacco: Never Used  Vaping Use  . Vaping Use: Never used  Substance and Sexual Activity  . Alcohol use: Not Currently    Comment: rarely, occasional beer   . Drug use: No  . Sexual activity: Not on file  Other Topics Concern  . Not on file  Social History Narrative   No regular exercise.   Social Determinants of Health   Financial Resource Strain: Low Risk   . Difficulty of Paying Living Expenses: Not hard at all  Food Insecurity: No Food Insecurity  . Worried About Charity fundraiser in the Last Year: Never true  .  Ran Out of Food in the Last Year: Never true  Transportation Needs: No Transportation Needs  . Lack of Transportation (Medical): No  . Lack of Transportation (Non-Medical): No  Physical Activity: Inactive  . Days of Exercise per Week: 0 days  . Minutes of Exercise per Session: 0 min  Stress: No Stress Concern Present  . Feeling of Stress : Not at all  Social Connections: Not on file     Family History: The patient's family history includes Alcohol abuse in an other family member; Cancer in his brother; Coronary artery disease in an other family member; Depression in an other family member; Diabetes in his mother and another family member; Heart disease in his mother; Hyperlipidemia in his mother and another family member; Hypertension in his mother and another family member; Kidney Stones in his brother; Pneumonia in his father.  ROS:   Please see the history of present illness.     All other systems reviewed and are negative.  EKGs/Labs/Other Studies Reviewed:    The following studies were reviewed today: Echo 03/07/19 1. The left ventricle has normal systolic function, with an ejection  fraction of 55-60%. The cavity size was normal. There is moderately  increased left ventricular wall thickness. Left  ventricular diastolic  function could not be evaluated secondary to  atrial fibrillation.  2. The right ventricle has normal systolic function. The cavity was  normal. There is no increase in right ventricular wall thickness.  3. Left atrial size was mild-moderately dilated.  4. The aortic valve was not well visualized Mild thickening of the aortic  valve. Mild to moderate aortic annular calcification noted.  5. The mitral valve was not well visualized. There is mild mitral annular  calcification present.  6. The interatrial septum was not well visualized.   Cardiac cath 03/07/19 Conclusion  Conclusions: 1. Severe single-vessel CAD with 80% mid LAD stenosis. 2. Non-obstructive LMCA, LCx, and RCA disease. 3. Upper normal left ventricular filling pressure with normal ejection fraction. 4. Mild to moderate mitral regurgitation in the setting of catheter-induced non-sustained ventricular tachycardia. 5. Successful PCI to mid LAD using Synergy 2.5 x 24 mm drug-eluting stent (post-dilated to 2.8 mm) with 0% residual stenosis and TIMI-3 flow.  Recommendations: 1. Overnight monitoring. 2. Obtain echo to better evaluate mitral regurgitation noted on left ventriculogram. 3. Restart heparin infusion 2 hours after TR band removal. 4. If no evidence of bleeding/vascular injury, recommend restarting rivaroxaban tomorrow and continuing rivaroxaban 20 mg daily and clopidogrel 75 mg daily x 12 months. 5. Aggressive secondary prevention.  Nelva Bush, MD [facility] Pager: [phone]  Coronary Diagrams   Diagnostic Dominance: Right    Intervention       EKG:  EKG is  ordered today.  The ekg ordered today demonstrates Afib, 74bpm, PVC, nonspecific ST/T wave changes, unchanged from prior  Recent Labs: 03/17/2021: ALT 13; BUN 15; Creatinine, Ser 1.15; Hemoglobin 10.7; Platelets 200; Potassium 4.2; Sodium 141; TSH 3.080  Recent Lipid Panel    Component Value Date/Time    CHOL 73 (L) 03/17/2021 1005   CHOL 195 08/01/2017 0902   CHOL 104 07/25/2014 0425   TRIG 170 (H) 03/17/2021 1005   TRIG 268 (H) 08/01/2017 0902   TRIG 243 (H) 07/25/2014 0425   TRIG 249 05/27/2010 0000   HDL 26 (L) 03/17/2021 1005   HDL 25 (L) 07/25/2014 0425   CHOLHDL 2.8 03/17/2021 1005   VLDL 54 (H) 08/01/2017 0902   VLDL 49 (H) 07/25/2014 0425  LDLCALC 19 03/17/2021 1005   LDLCALC 30 07/25/2014 0425    Physical Exam:    VS:  BP (!) 150/78 (BP Location: Left Arm, Patient Position: Sitting, Cuff Size: Normal)   Pulse 74   Ht 5\' 10"  (1.778 m)   Wt 270 lb 8 oz (122.7 kg)   SpO2 95%   BMI 38.81 kg/m     Wt Readings from Last 3 Encounters:  04/04/21 270 lb 8 oz (122.7 kg)  03/18/21 267 lb 8 oz (121.3 kg)  03/11/21 269 lb 3.2 oz (122.1 kg)     GEN:  Well nourished, well developed in no acute distress HEENT: Normal NECK: No JVD; No carotid bruits LYMPHATICS: No lymphadenopathy CARDIAC: Irreg Irreg, no murmurs, rubs, gallops RESPIRATORY:  Clear to auscultation without rales, wheezing or rhonchi  ABDOMEN: Soft, non-tender, non-distended MUSCULOSKELETAL:  No edema; No deformity  SKIN: Warm and dry NEUROLOGIC:  Alert and oriented x 3 PSYCHIATRIC:  Normal affect   ASSESSMENT:    1. Essential hypertension   2. Chronic diastolic heart failure ([place])   3. Coronary artery disease involving native coronary artery of native heart with angina pectoris ([place])   4. Hyperlipidemia, mixed   5. SOB (shortness of breath)   6. Stage 3 chronic kidney disease, unspecified whether stage 3a or 3b CKD ([place])   7. Anemia, unspecified type    PLAN:    In order of problems listed above:  CAD status post DES to LM LAD in 03/07/2019 Patient denies chest pain, does have SOB which might be multifactorial given deconditioning and anemia. Will order echo. Not on aspirin or plavix given need for Xarelto. Continue statin, BB.   Chronic diastolic CHF Echo in 3546 showed LVEF 55-60%. SOB for the  last few months, which might be multifactorial as above. He is on lasix 20mg  BID. He is euvolemic on exam. Will update echo and see patient back after this. Has had stable kidney function on labs in 03/17/21.  Hypertension Bps still above goal. I will change metoprolol to coreg 25mg  BID. Continue Bps at home. Continue dilt, Imdur, ramipril. Can consider titration on Imdur if BP still high.   Permanent A. Fib Rate controled on EKG today. Switch Metoprolol to Coreg as above. Continue diltiazem. Continue Xarelto  CKD stage III Stable labs in 02/2021  Hyperlipidemia LDL 19 02/2021. Continue statin.   Anemia Hgb 13.3> 10.7 since December 2021. Has been down to 11 in the past. Plavix was stopped at the last visit. He denies any s/s of bleeding. He is on Xarelto. PCP following closely. He sees heme/onc today and GI next month.   Disposition: Follow up in 4-6 weeks with MD/APP  Signed, Jamise Pentland Ninfa Meeker, PA-C  04/04/2021 10:08 AM    [place]

## 2021-04-05 ENCOUNTER — Inpatient Hospital Stay: Payer: Medicare HMO

## 2021-04-05 VITALS — BP 152/78 | HR 87 | Temp 97.0°F | Resp 18

## 2021-04-05 DIAGNOSIS — I4891 Unspecified atrial fibrillation: Secondary | ICD-10-CM | POA: Diagnosis not present

## 2021-04-05 DIAGNOSIS — E669 Obesity, unspecified: Secondary | ICD-10-CM | POA: Diagnosis not present

## 2021-04-05 DIAGNOSIS — K219 Gastro-esophageal reflux disease without esophagitis: Secondary | ICD-10-CM | POA: Diagnosis not present

## 2021-04-05 DIAGNOSIS — E785 Hyperlipidemia, unspecified: Secondary | ICD-10-CM | POA: Diagnosis not present

## 2021-04-05 DIAGNOSIS — Z87891 Personal history of nicotine dependence: Secondary | ICD-10-CM | POA: Diagnosis not present

## 2021-04-05 DIAGNOSIS — J449 Chronic obstructive pulmonary disease, unspecified: Secondary | ICD-10-CM | POA: Diagnosis not present

## 2021-04-05 DIAGNOSIS — Z7901 Long term (current) use of anticoagulants: Secondary | ICD-10-CM | POA: Diagnosis not present

## 2021-04-05 DIAGNOSIS — D509 Iron deficiency anemia, unspecified: Secondary | ICD-10-CM | POA: Diagnosis not present

## 2021-04-05 DIAGNOSIS — Z86010 Personal history of colonic polyps: Secondary | ICD-10-CM | POA: Diagnosis not present

## 2021-04-05 DIAGNOSIS — D500 Iron deficiency anemia secondary to blood loss (chronic): Secondary | ICD-10-CM

## 2021-04-05 DIAGNOSIS — E119 Type 2 diabetes mellitus without complications: Secondary | ICD-10-CM | POA: Diagnosis not present

## 2021-04-05 LAB — HAPTOGLOBIN: Haptoglobin: 204 mg/dL (ref 32–363)

## 2021-04-05 MED ORDER — SODIUM CHLORIDE 0.9 % IV SOLN: 200.0000 mg | INTRAVENOUS | Status: DC

## 2021-04-05 MED ORDER — IRON SUCROSE 20 MG/ML IV SOLN
200.0000 mg | Freq: Once | INTRAVENOUS | Status: AC
  Administered 2021-04-05: 200 mg via INTRAVENOUS
  Filled 2021-04-05: qty 10

## 2021-04-05 MED ORDER — SODIUM CHLORIDE 0.9 % IV SOLN
Freq: Once | INTRAVENOUS | Status: AC
Start: 2021-04-05 — End: 2021-04-05
  Filled 2021-04-05: qty 250

## 2021-04-05 NOTE — Progress Notes (Signed)
Patient monitored x 20 minutes post infusion. Patient tolerated infusion well. Discharged home.

## 2021-04-06 LAB — MULTIPLE MYELOMA PANEL, SERUM
Albumin SerPl Elph-Mcnc: 3.9 g/dL (ref 2.9–4.4)
Albumin/Glob SerPl: 1.5 (ref 0.7–1.7)
Alpha 1: 0.2 g/dL (ref 0.0–0.4)
Alpha2 Glob SerPl Elph-Mcnc: 0.9 g/dL (ref 0.4–1.0)
B-Globulin SerPl Elph-Mcnc: 1.1 g/dL (ref 0.7–1.3)
Gamma Glob SerPl Elph-Mcnc: 0.4 g/dL (ref 0.4–1.8)
Globulin, Total: 2.7 g/dL (ref 2.2–3.9)
IgA: 259 mg/dL (ref 61–437)
IgG (Immunoglobin G), Serum: 512 mg/dL — ABNORMAL LOW (ref 603–1613)
IgM (Immunoglobulin M), Srm: <5 mg/dL — ABNORMAL LOW (ref 20–172)
Total Protein ELP: 6.6 g/dL (ref 6.0–8.5)

## 2021-04-08 ENCOUNTER — Other Ambulatory Visit: Payer: Self-pay

## 2021-04-08 DIAGNOSIS — J439 Emphysema, unspecified: Secondary | ICD-10-CM

## 2021-04-08 MED ORDER — FLUTICASONE-SALMETEROL 100-50 MCG/DOSE IN AEPB: 1.0000 | INHALATION_SPRAY | Freq: Two times a day (BID) | RESPIRATORY_TRACT | 3 refills | Status: DC

## 2021-04-11 ENCOUNTER — Inpatient Hospital Stay: Payer: Medicare HMO | Admitting: Oncology

## 2021-04-11 ENCOUNTER — Encounter: Payer: Self-pay | Admitting: Oncology

## 2021-04-11 ENCOUNTER — Inpatient Hospital Stay: Payer: Medicare HMO

## 2021-04-11 VITALS — BP 130/66 | HR 61 | Resp 18

## 2021-04-11 VITALS — BP 122/66 | HR 60 | Temp 98.7°F | Resp 16 | Ht 70.0 in | Wt 264.7 lb

## 2021-04-11 DIAGNOSIS — K219 Gastro-esophageal reflux disease without esophagitis: Secondary | ICD-10-CM | POA: Diagnosis not present

## 2021-04-11 DIAGNOSIS — Z86010 Personal history of colonic polyps: Secondary | ICD-10-CM | POA: Diagnosis not present

## 2021-04-11 DIAGNOSIS — D500 Iron deficiency anemia secondary to blood loss (chronic): Secondary | ICD-10-CM

## 2021-04-11 DIAGNOSIS — J449 Chronic obstructive pulmonary disease, unspecified: Secondary | ICD-10-CM | POA: Diagnosis not present

## 2021-04-11 DIAGNOSIS — D509 Iron deficiency anemia, unspecified: Secondary | ICD-10-CM | POA: Diagnosis not present

## 2021-04-11 DIAGNOSIS — E785 Hyperlipidemia, unspecified: Secondary | ICD-10-CM | POA: Diagnosis not present

## 2021-04-11 DIAGNOSIS — I4891 Unspecified atrial fibrillation: Secondary | ICD-10-CM | POA: Diagnosis not present

## 2021-04-11 DIAGNOSIS — Z7901 Long term (current) use of anticoagulants: Secondary | ICD-10-CM | POA: Diagnosis not present

## 2021-04-11 DIAGNOSIS — E119 Type 2 diabetes mellitus without complications: Secondary | ICD-10-CM | POA: Diagnosis not present

## 2021-04-11 DIAGNOSIS — E669 Obesity, unspecified: Secondary | ICD-10-CM | POA: Diagnosis not present

## 2021-04-11 DIAGNOSIS — Z87891 Personal history of nicotine dependence: Secondary | ICD-10-CM | POA: Diagnosis not present

## 2021-04-11 MED ORDER — IRON SUCROSE 20 MG/ML IV SOLN
200.0000 mg | Freq: Once | INTRAVENOUS | Status: AC
  Administered 2021-04-11: 200 mg via INTRAVENOUS
  Filled 2021-04-11: qty 10

## 2021-04-11 MED ORDER — SODIUM CHLORIDE 0.9 % IV SOLN: 200.0000 mg | INTRAVENOUS | Status: DC

## 2021-04-11 MED ORDER — SODIUM CHLORIDE 0.9 % IV SOLN
Freq: Once | INTRAVENOUS | Status: AC
  Filled 2021-04-11: qty 250

## 2021-04-11 NOTE — Patient Instructions (Signed)
[facility] [place] [facility]    Discharge Instructions: Thank you for choosing [facility] to provide your oncology and hematology care.  If you have a lab appointment with the Cancer Center, please go directly to the Cancer Center and check in at the registration area.  Wear comfortable clothing and clothing appropriate for easy access to any Portacath or PICC line.   We strive to give you quality time with your provider. You may need to reschedule your appointment if you arrive late (15 or more minutes).  Arriving late affects you and other patients whose appointments are after yours.  Also, if you miss three or more appointments without notifying the office, you may be dismissed from the clinic at the provider's discretion.      For prescription refill requests, have your pharmacy contact our office and allow 72 hours for refills to be completed.    Today you received the following treatment: Venofer.   To help prevent nausea and vomiting after your treatment, we encourage you to take your nausea medication as directed.  BELOW ARE SYMPTOMS THAT SHOULD BE REPORTED IMMEDIATELY: . *FEVER GREATER THAN 100.4 F (38 C) OR HIGHER . *CHILLS OR SWEATING . *NAUSEA AND VOMITING THAT IS NOT CONTROLLED WITH YOUR NAUSEA MEDICATION . *UNUSUAL SHORTNESS OF BREATH . *UNUSUAL BRUISING OR BLEEDING . *URINARY PROBLEMS (pain or burning when urinating, or frequent urination) . *BOWEL PROBLEMS (unusual diarrhea, constipation, pain near the anus) . TENDERNESS IN MOUTH AND THROAT WITH OR WITHOUT PRESENCE OF ULCERS (sore throat, sores in mouth, or a toothache) . UNUSUAL RASH, SWELLING OR PAIN  . UNUSUAL VAGINAL DISCHARGE OR ITCHING   Items with * indicate a potential emergency and should be followed up as soon as possible or go to the Emergency Department if any problems should occur.  Please show the CHEMOTHERAPY ALERT CARD or IMMUNOTHERAPY ALERT CARD at check-in to the  Emergency Department and triage nurse.  Should you have questions after your visit or need to cancel or reschedule your appointment, please contact [facility] [place] [facility]  [phone] and follow the prompts.  Office hours are 8:00 a.m. to 4:30 p.m. Monday - Friday. Please note that voicemails left after 4:00 p.m. may not be returned until the following business day.  We are closed weekends and major holidays. You have access to a nurse at all times for urgent questions. Please call the main number to the clinic [phone] and follow the prompts.  For any non-urgent questions, you may also contact your provider using MyChart. We now offer e-Visits for anyone 18 and older to request care online for non-urgent symptoms. For details visit [email or web address].   Also download the MyChart app! Go to the app store, search "MyChart", open the app, select [place], and log in with your MyChart username and password.  Due to Covid, a mask is required upon entering the hospital/clinic. If you do not have a mask, one will be given to you upon arrival. For doctor visits, patients may have 1 support person aged 18 or older with them. For treatment visits, patients cannot have anyone with them due to current Covid guidelines and our immunocompromised population.   Iron Sucrose injection What is this medicine? IRON SUCROSE (AHY ern SOO krohs) is an iron complex. Iron is used to make healthy red blood cells, which carry oxygen and nutrients throughout the body. This medicine is used to treat iron deficiency anemia in people with chronic kidney disease.   disease. This medicine may be used for other purposes; ask your health care provider or pharmacist if you have questions. COMMON BRAND NAME(S): Venofer What should I tell my health care provider before I take this medicine? They need to know if you have any of these conditions:  anemia not caused by low iron levels  heart disease  high  levels of iron in the blood  kidney disease  liver disease  an unusual or allergic reaction to iron, other medicines, foods, dyes, or preservatives  pregnant or trying to get pregnant  breast-feeding How should I use this medicine? This medicine is for infusion into a vein. It is given by a health care professional in a hospital or clinic setting. Talk to your pediatrician regarding the use of this medicine in children. While this drug may be prescribed for children as young as 2 years for selected conditions, precautions do apply. Overdosage: If you think you have taken too much of this medicine contact a poison control center or emergency room at once. NOTE: This medicine is only for you. Do not share this medicine with others. What if I miss a dose? It is important not to miss your dose. Call your doctor or health care professional if you are unable to keep an appointment. What may interact with this medicine? Do not take this medicine with any of the following medications:  deferoxamine  dimercaprol  other iron products This medicine may also interact with the following medications:  chloramphenicol  deferasirox This list may not describe all possible interactions. Give your health care provider a list of all the medicines, herbs, non-prescription drugs, or dietary supplements you use. Also tell them if you smoke, drink alcohol, or use illegal drugs. Some items may interact with your medicine. What should I watch for while using this medicine? Visit your doctor or healthcare professional regularly. Tell your doctor or healthcare professional if your symptoms do not start to get better or if they get worse. You may need blood work done while you are taking this medicine. You may need to follow a special diet. Talk to your doctor. Foods that contain iron include: whole grains/cereals, dried fruits, beans, or peas, leafy green vegetables, and organ meats (liver, kidney). What side  effects may I notice from receiving this medicine? Side effects that you should report to your doctor or health care professional as soon as possible:  allergic reactions like skin rash, itching or hives, swelling of the face, lips, or tongue  breathing problems  changes in blood pressure  cough  fast, irregular heartbeat  feeling faint or lightheaded, falls  fever or chills  flushing, sweating, or hot feelings  joint or muscle aches/pains  seizures  swelling of the ankles or feet  unusually weak or tired Side effects that usually do not require medical attention (report to your doctor or health care professional if they continue or are bothersome):  diarrhea  feeling achy  headache  irritation at site where injected  nausea, vomiting  stomach upset  tiredness This list may not describe all possible side effects. Call your doctor for medical advice about side effects. You may report side effects to FDA at 1-800-FDA-1088. Where should I keep my medicine? This drug is given in a hospital or clinic and will not be stored at home. NOTE: This sheet is a summary. It may not cover all possible information. If you have questions about this medicine, talk to your doctor, pharmacist, or health care provider.  2021 Elsevier/Gold Standard (2011-09-14 17:14:35)

## 2021-04-11 NOTE — Progress Notes (Signed)
pt just fatigued. Can't go far before pt has to rest

## 2021-04-11 NOTE — Progress Notes (Signed)
Hematology/Oncology Consult note [facility]  [phone] [phone]  Patient Care Team: [name], MD as PCP - General Rockey Situ Kathlene November, MD as PCP - Cardiology (Cardiology) Vanita Ingles, RN as Case Manager (General Practice) Vladimir Faster, [facility] (Pharmacist)   Name of the patient: Ernest [name]  [id number]  02-22-1951   Date of visit: 04/11/21  Diagnosis-iron deficiency anemia  Chief complaint/ Reason for visit-discussed results of blood work  Heme/Onc history: patient is a 70 year old male who was recently seen by GI for iron deficiency anemia. He was also found to have some hemoptysis in March 2019 and is being followed by pulmonary. CT chest did not reveal any significant abnormalities. He is on Xarelto for his A. fib. He has had EGD and colonoscopy in 2013 and 2015. 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 EGD was normal.Colonoscopy on 06/26/2018 showed 3 polyps in the transverse colon and one polyp in the sigmoid colon. They were negative for malignancy. No active bleeding noted. EGD showed salmon-colored mucosa suspicious for short segment Barrett's esophagus. Erythematous mucosa in the antrum. Few gastric polyps. Normal duodenal bulb  Patient had evidence of iron deficiency anemia in the past and received 2 doses of Feraheme in May 2019.  Last seen in January 2020 and here to reestablish follow-up.  Labs were again consistent with iron deficiency anemia requiring Venofer  Interval history-patient reports ongoing fatigue.  He has to take frequent breaks when he is doing his yard work.  Denies any blood loss in his stool or dark melanotic stools.  Denies any consistent use of NSAIDs  ECOG PS- 1 Pain scale- 0  Review of systems- Review of Systems  Constitutional: Positive for malaise/fatigue. Negative for chills, fever and  weight loss.  HENT: Negative for congestion, ear discharge and nosebleeds.   Eyes: Negative for blurred vision.  Respiratory: Negative for cough, hemoptysis, sputum production, shortness of breath and wheezing.   Cardiovascular: Negative for chest pain, palpitations, orthopnea and claudication.  Gastrointestinal: Negative for abdominal pain, blood in stool, constipation, diarrhea, heartburn, melena, nausea and vomiting.  Genitourinary: Negative for dysuria, flank pain, frequency, hematuria and urgency.  Musculoskeletal: Negative for back pain, joint pain and myalgias.  Skin: Negative for rash.  Neurological: Negative for dizziness, tingling, focal weakness, seizures, weakness and headaches.  Endo/Heme/Allergies: Does not bruise/bleed easily.  Psychiatric/Behavioral: Negative for depression and suicidal ideas. The patient does not have insomnia.      Allergies  Allergen Reactions  . Doxycycline Other (See Comments)    Mouth sores      Past Medical History:  Diagnosis Date  . Anemia   . Asthma   . COPD (chronic obstructive pulmonary disease) ([place])   . Diabetes mellitus    Type II  . Early Pulmonary fibrosis ([place])   . GERD (gastroesophageal reflux disease)   . History of echocardiogram    a. 02/2017 Echo: EF 60-65%, no rwma, mild MR, mod dil LA. Nl RV fxn. PASP 34mmHg.  Marland Kitchen Hyperlipidemia   . Hypertension   . Morbid obesity ([place])   . Non-obstructive CAD (coronary artery disease)    a. 2011 Cath: nonobs dzs; b. 07/2014 Cath: LM 30, LAD nl, LCX nl, RCA 20p, 61m.  Marland Kitchen Permanent atrial fibrillation (HCC)    a. CHA2DS2VASc = 4-->xarelto.  . Polyp of sigmoid colon      Past Surgical History:  Procedure Laterality Date  .  ANKLE SURGERY    . CARDIAC CATHETERIZATION  05/2010   [facility]  . CARDIAC CATHETERIZATION     [facility]  . CARDIAC CATHETERIZATION     [facility]  . CARDIAC CATHETERIZATION  07/2014   [facility]  . COLONOSCOPY    . COLONOSCOPY WITH PROPOFOL N/A 06/26/2018   Procedure: COLONOSCOPY  WITH PROPOFOL;  Surgeon: Virgel Manifold, MD;  Location: [facility] ENDOSCOPY;  Service: Endoscopy;  Laterality: N/A;  . CORONARY STENT INTERVENTION N/A 03/07/2019   Procedure: CORONARY STENT INTERVENTION;  Surgeon: Nelva Bush, MD;  Location: [place] [facility];  Service: Cardiovascular;  Laterality: N/A;  . ENTEROSCOPY N/A 08/05/2018   Procedure: ENTEROSCOPY;  Surgeon: Virgel Manifold, MD;  Location: [facility] ENDOSCOPY;  Service: Endoscopy;  Laterality: N/A;  . ESOPHAGOGASTRODUODENOSCOPY (EGD) WITH PROPOFOL N/A 06/26/2018   Procedure: ESOPHAGOGASTRODUODENOSCOPY (EGD) WITH PROPOFOL;  Surgeon: Virgel Manifold, MD;  Location: [facility] ENDOSCOPY;  Service: Endoscopy;  Laterality: N/A;  . LEFT HEART CATH AND CORONARY ANGIOGRAPHY N/A 03/07/2019   Procedure: LEFT HEART CATH AND CORONARY ANGIOGRAPHY;  Surgeon: Nelva Bush, MD;  Location: [place] [facility];  Service: Cardiovascular;  Laterality: N/A;  . TESTICLE SURGERY  70's  . TUMOR EXCISION     Neck and finger; benign  . WRIST SURGERY      Social History   Socioeconomic History  . Marital status: Widowed    Spouse name: Not on file  . Number of children: Not on file  . Years of education: Not on file  . Highest education level: 8th grade  Occupational History  . Occupation: part time   Tobacco Use  . Smoking status: Former Smoker    Packs/day: 4.00    Years: 28.00    Pack years: 112.00    Types: Cigarettes    Quit date: 08/25/1993    Years since quitting: 27.6  . Smokeless tobacco: Never Used  Vaping Use  . Vaping Use: Never used  Substance and Sexual Activity  . Alcohol use: Not Currently  . Drug use: No  . Sexual activity: Not Currently  Other Topics Concern  . Not on file  Social History Narrative   No regular exercise.   Social Determinants of Health   Financial Resource Strain: Low Risk   . Difficulty of Paying Living Expenses: Not hard at all  Food Insecurity: No Food Insecurity  . Worried About Paediatric nurse in the Last Year: Never true  . Ran Out of Food in the Last Year: Never true  Transportation Needs: No Transportation Needs  . Lack of Transportation (Medical): No  . Lack of Transportation (Non-Medical): No  Physical Activity: Inactive  . Days of Exercise per Week: 0 days  . Minutes of Exercise per Session: 0 min  Stress: No Stress Concern Present  . Feeling of Stress : Not at all  Social Connections: Not on file  Intimate Partner Violence: Not on file    Family History  Problem Relation Age of Onset  . Hypertension Mother   . Hyperlipidemia Mother   . Diabetes Mother   . Heart disease Mother        CABG  . Pneumonia Father        rare type  . Diabetes Other   . Depression Other   . Coronary artery disease Other   . Alcohol abuse Other   . Hypertension Other   . Hyperlipidemia Other   . Cancer Brother   . Kidney Stones Brother  Current Outpatient Medications:  .  ACCU-CHEK GUIDE test strip, , Disp: , Rfl:  .  albuterol (VENTOLIN HFA) 108 (90 Base) MCG/ACT inhaler, Inhale 2 puffs into the lungs every 4 (four) hours as needed for wheezing or shortness of breath. (Patient taking differently: Inhale 2 puffs into the lungs every 4 (four) hours as needed for wheezing or shortness of breath. Uses 2-3 times daily), Disp: 18 g, Rfl: 6 .  atorvastatin (LIPITOR) 80 MG tablet, TAKE 1 TABLET(80 MG) BY MOUTH DAILY, Disp: 90 tablet, Rfl: 1 .  carvedilol (COREG) 25 MG tablet, Take 1 tablet (25 mg total) by mouth 2 (two) times daily., Disp: 180 tablet, Rfl: 3 .  clotrimazole-betamethasone (LOTRISONE) cream, Apply 1 application topically 2 (two) times daily., Disp: 30 g, Rfl: 2 .  dapagliflozin propanediol (FARXIGA) 5 MG TABS tablet, Take 1 tablet (5 mg total) by mouth daily before breakfast., Disp: 30 tablet, Rfl: 5 .  diltiazem (CARDIZEM CD) 180 MG 24 hr capsule, Take 1 capsule (180 mg total) by mouth daily., Disp: 90 capsule, Rfl: 3 .  Fluticasone-Salmeterol (ADVAIR)  100-50 MCG/DOSE AEPB, Inhale 1 puff into the lungs 2 (two) times daily., Disp: 1 each, Rfl: 3 .  furosemide (LASIX) 40 MG tablet, Take 0.5 tablets (20 mg total) by mouth 2 (two) times daily., Disp: 180 tablet, Rfl: 1 .  gabapentin (NEURONTIN) 600 MG tablet, TAKE 2 TABLETS(1200 MG) BY MOUTH AT BEDTIME, Disp: 180 tablet, Rfl: 0 .  isosorbide mononitrate (IMDUR) 60 MG 24 hr tablet, TAKE 1 TABLET(60 MG) BY MOUTH DAILY, Disp: 30 tablet, Rfl: 3 .  metFORMIN (GLUCOPHAGE-XR) 500 MG 24 hr tablet, TAKE 2 TABLETS(1000 MG) BY MOUTH TWICE DAILY, Disp: 360 tablet, Rfl: 1 .  pantoprazole (PROTONIX) 40 MG tablet, TAKE 1 TABLET(40 MG) BY MOUTH DAILY, Disp: 90 tablet, Rfl: 2 .  ramipril (ALTACE) 10 MG capsule, Take 1 capsule (10 mg total) by mouth daily., Disp: 90 capsule, Rfl: 0 .  rivaroxaban (XARELTO) 20 MG TABS tablet, TAKE 1 TABLET(20 MG) BY MOUTH DAILY WITH SUPPER, Disp: 30 tablet, Rfl: 0 .  nitroGLYCERIN (NITROSTAT) 0.4 MG SL tablet, Place 1 tablet (0.4 mg total) under the tongue every 5 (five) minutes as needed for chest pain. (Patient not taking: Reported on 04/11/2021), Disp: 25 tablet, Rfl: 2 No current facility-administered medications for this visit.  Facility-Administered Medications Ordered in Other Visits:  .  0.9 %  sodium chloride infusion, , Intravenous, Once, Sindy Guadeloupe, MD .  iron sucrose (VENOFER) 200 mg in sodium chloride 0.9 % 100 mL IVPB, 200 mg, Intravenous, Weekly, Sindy Guadeloupe, MD  Physical exam:  Vitals:   04/11/21 1337  BP: 122/66  Pulse: 60  Resp: 16  Temp: 98.7 F (37.1 C)  TempSrc: Tympanic  Weight: 264 lb 11.2 oz (120.1 kg)  Height: 5\' 10"  (1.778 m)   Physical Exam Constitutional:      General: He is not in acute distress. Cardiovascular:     Rate and Rhythm: Normal rate and regular rhythm.     Heart sounds: Normal heart sounds.  Pulmonary:     Effort: Pulmonary effort is normal.     Breath sounds: Normal breath sounds.  Skin:    General: Skin is warm and  dry.  Neurological:     Mental Status: He is alert and oriented to person, place, and time.      CMP Latest Ref Rng & Units 04/04/2021  Glucose 70 - 99 mg/dL 154(H)  BUN 8 - 23  mg/dL 17  Creatinine 0.61 - 1.24 mg/dL 1.19  Sodium 135 - 145 mmol/L 141  Potassium 3.5 - 5.1 mmol/L 4.0  Chloride 98 - 111 mmol/L 105  CO2 22 - 32 mmol/L 26  Calcium 8.9 - 10.3 mg/dL 9.1  Total Protein 6.5 - 8.1 g/dL 7.0  Total Bilirubin 0.3 - 1.2 mg/dL 1.7(H)  Alkaline Phos 38 - 126 U/L 67  AST 15 - 41 U/L 21  ALT 0 - 44 U/L 15   CBC Latest Ref Rng & Units 04/04/2021  WBC 4.0 - 10.5 K/uL 7.6  Hemoglobin 13.0 - 17.0 g/dL 10.6(L)  Hematocrit 39.0 - 52.0 % 35.6(L)  Platelets 150 - 400 K/uL 204      Assessment and plan- Patient is a 70 y.o. male with iron deficiency anemia here for routine follow-up  Patient's hemoglobin is drifted down to 10.6 from a baseline of 13.  Anemia work-up is consistent with evidence of iron deficiency.  We will proceed with 5 doses of Venofer at this time.  Repeat CBC ferritin and iron studies in 3 months and I will see him thereafter.  He is following up with Dr. Bonna Gains next month.  He has had a EGD and colonoscopy with her in the past in 2019 which showed evidence of Barrett's esophagus   Visit Diagnosis 1. Iron deficiency anemia, unspecified iron deficiency anemia type      Dr. [name], MD, MPH [facility] at [facility] [phone] 04/11/2021 1:59 PM

## 2021-04-13 ENCOUNTER — Inpatient Hospital Stay: Payer: Medicare HMO

## 2021-04-13 ENCOUNTER — Other Ambulatory Visit: Payer: Self-pay

## 2021-04-13 VITALS — BP 137/72 | HR 61 | Temp 97.8°F | Resp 18

## 2021-04-13 DIAGNOSIS — Z87891 Personal history of nicotine dependence: Secondary | ICD-10-CM | POA: Diagnosis not present

## 2021-04-13 DIAGNOSIS — E119 Type 2 diabetes mellitus without complications: Secondary | ICD-10-CM | POA: Diagnosis not present

## 2021-04-13 DIAGNOSIS — J449 Chronic obstructive pulmonary disease, unspecified: Secondary | ICD-10-CM | POA: Diagnosis not present

## 2021-04-13 DIAGNOSIS — Z86010 Personal history of colonic polyps: Secondary | ICD-10-CM | POA: Diagnosis not present

## 2021-04-13 DIAGNOSIS — I4891 Unspecified atrial fibrillation: Secondary | ICD-10-CM | POA: Diagnosis not present

## 2021-04-13 DIAGNOSIS — Z7901 Long term (current) use of anticoagulants: Secondary | ICD-10-CM | POA: Diagnosis not present

## 2021-04-13 DIAGNOSIS — D509 Iron deficiency anemia, unspecified: Secondary | ICD-10-CM | POA: Diagnosis not present

## 2021-04-13 DIAGNOSIS — E785 Hyperlipidemia, unspecified: Secondary | ICD-10-CM | POA: Diagnosis not present

## 2021-04-13 DIAGNOSIS — E669 Obesity, unspecified: Secondary | ICD-10-CM | POA: Diagnosis not present

## 2021-04-13 DIAGNOSIS — D500 Iron deficiency anemia secondary to blood loss (chronic): Secondary | ICD-10-CM

## 2021-04-13 DIAGNOSIS — K219 Gastro-esophageal reflux disease without esophagitis: Secondary | ICD-10-CM | POA: Diagnosis not present

## 2021-04-13 MED ORDER — IRON SUCROSE 20 MG/ML IV SOLN
200.0000 mg | Freq: Once | INTRAVENOUS | Status: AC
  Administered 2021-04-13: 200 mg via INTRAVENOUS
  Filled 2021-04-13: qty 10

## 2021-04-13 MED ORDER — SODIUM CHLORIDE 0.9 % IV SOLN: 200.0000 mg | INTRAVENOUS | Status: DC

## 2021-04-13 MED ORDER — SODIUM CHLORIDE 0.9 % IV SOLN
Freq: Once | INTRAVENOUS | Status: AC
Start: 2021-04-13 — End: 2021-04-13
  Filled 2021-04-13: qty 250

## 2021-04-15 ENCOUNTER — Inpatient Hospital Stay: Payer: Medicare HMO

## 2021-04-15 VITALS — BP 119/73 | HR 61 | Resp 18

## 2021-04-15 DIAGNOSIS — E669 Obesity, unspecified: Secondary | ICD-10-CM | POA: Diagnosis not present

## 2021-04-15 DIAGNOSIS — Z87891 Personal history of nicotine dependence: Secondary | ICD-10-CM | POA: Diagnosis not present

## 2021-04-15 DIAGNOSIS — D509 Iron deficiency anemia, unspecified: Secondary | ICD-10-CM | POA: Diagnosis not present

## 2021-04-15 DIAGNOSIS — I4891 Unspecified atrial fibrillation: Secondary | ICD-10-CM | POA: Diagnosis not present

## 2021-04-15 DIAGNOSIS — K219 Gastro-esophageal reflux disease without esophagitis: Secondary | ICD-10-CM | POA: Diagnosis not present

## 2021-04-15 DIAGNOSIS — J449 Chronic obstructive pulmonary disease, unspecified: Secondary | ICD-10-CM | POA: Diagnosis not present

## 2021-04-15 DIAGNOSIS — Z7901 Long term (current) use of anticoagulants: Secondary | ICD-10-CM | POA: Diagnosis not present

## 2021-04-15 DIAGNOSIS — E119 Type 2 diabetes mellitus without complications: Secondary | ICD-10-CM | POA: Diagnosis not present

## 2021-04-15 DIAGNOSIS — D500 Iron deficiency anemia secondary to blood loss (chronic): Secondary | ICD-10-CM

## 2021-04-15 DIAGNOSIS — Z86010 Personal history of colonic polyps: Secondary | ICD-10-CM | POA: Diagnosis not present

## 2021-04-15 DIAGNOSIS — E785 Hyperlipidemia, unspecified: Secondary | ICD-10-CM | POA: Diagnosis not present

## 2021-04-15 MED ORDER — SODIUM CHLORIDE 0.9 % IV SOLN: 200.0000 mg | INTRAVENOUS | Status: DC

## 2021-04-15 MED ORDER — IRON SUCROSE 20 MG/ML IV SOLN
200.0000 mg | Freq: Once | INTRAVENOUS | Status: AC
Start: 2021-04-15 — End: 2021-04-15
  Administered 2021-04-15: 200 mg via INTRAVENOUS
  Filled 2021-04-15: qty 10

## 2021-04-15 MED ORDER — SODIUM CHLORIDE 0.9 % IV SOLN
Freq: Once | INTRAVENOUS | Status: AC
  Filled 2021-04-15: qty 250

## 2021-04-18 ENCOUNTER — Inpatient Hospital Stay: Payer: Medicare HMO | Attending: Oncology

## 2021-04-18 ENCOUNTER — Telehealth: Payer: Self-pay

## 2021-04-18 VITALS — BP 128/67 | HR 61 | Temp 97.5°F | Resp 18

## 2021-04-18 DIAGNOSIS — D500 Iron deficiency anemia secondary to blood loss (chronic): Secondary | ICD-10-CM

## 2021-04-18 DIAGNOSIS — Z79899 Other long term (current) drug therapy: Secondary | ICD-10-CM | POA: Insufficient documentation

## 2021-04-18 DIAGNOSIS — D509 Iron deficiency anemia, unspecified: Secondary | ICD-10-CM | POA: Diagnosis not present

## 2021-04-18 MED ORDER — SODIUM CHLORIDE 0.9 % IV SOLN: 200.0000 mg | INTRAVENOUS | Status: DC

## 2021-04-18 MED ORDER — IRON SUCROSE 20 MG/ML IV SOLN
200.0000 mg | Freq: Once | INTRAVENOUS | Status: AC
  Administered 2021-04-18: 200 mg via INTRAVENOUS
  Filled 2021-04-18: qty 10

## 2021-04-18 MED ORDER — SODIUM CHLORIDE 0.9 % IV SOLN
Freq: Once | INTRAVENOUS | Status: AC
  Filled 2021-04-18: qty 250

## 2021-04-18 NOTE — Telephone Encounter (Signed)
Lvm to r/s 5/12 appt

## 2021-04-18 NOTE — Patient Instructions (Signed)
[facility] [place]    Discharge Instructions:  Thank you for choosing [facility] to provide your oncology and hematology care.  If you have a lab appointment with the Cancer Center, please go directly to the Cancer Center and check in at the registration area.  We strive to give you quality time with your provider. You may need to reschedule your appointment if you arrive late (15 or more minutes).  Arriving late affects you and other patients whose appointments are after yours.  Also, if you miss three or more appointments without notifying the office, you may be dismissed from the clinic at the provider's discretion.      For prescription refill requests, have your pharmacy contact our office and allow 72 hours for refills to be completed.    Today you received the following treatment: Venofer.  BELOW ARE SYMPTOMS THAT SHOULD BE REPORTED IMMEDIATELY: . *FEVER GREATER THAN 100.4 F (38 C) OR HIGHER . *CHILLS OR SWEATING . *NAUSEA AND VOMITING THAT IS NOT CONTROLLED WITH YOUR NAUSEA MEDICATION . *UNUSUAL SHORTNESS OF BREATH . *UNUSUAL BRUISING OR BLEEDING . *URINARY PROBLEMS (pain or burning when urinating, or frequent urination) . *BOWEL PROBLEMS (unusual diarrhea, constipation, pain near the anus) . TENDERNESS IN MOUTH AND THROAT WITH OR WITHOUT PRESENCE OF ULCERS (sore throat, sores in mouth, or a toothache) . UNUSUAL RASH, SWELLING OR PAIN  . UNUSUAL VAGINAL DISCHARGE OR ITCHING   Items with * indicate a potential emergency and should be followed up as soon as possible or go to the Emergency Department if any problems should occur.  Should you have questions after your visit or need to cancel or reschedule your appointment, please contact [facility] [place]  [phone] and follow the prompts.  Office hours are 8:00 a.m. to 4:30 p.m. Monday - Friday. Please note that voicemails left after 4:00 p.m. may not be  returned until the following business day.  We are closed weekends and major holidays. You have access to a nurse at all times for urgent questions. Please call the main number to the clinic [phone] and follow the prompts.  For any non-urgent questions, you may also contact your provider using MyChart. We now offer e-Visits for anyone 18 and older to request care online for non-urgent symptoms. For details visit [email or web address].   Also download the MyChart app! Go to the app store, search "MyChart", open the app, select [facility], and log in with your MyChart username and password.  Due to Covid, a mask is required upon entering the hospital/clinic. If you do not have a mask, one will be given to you upon arrival. For doctor visits, patients may have 1 support person aged 18 or older with them. For treatment visits, patients cannot have anyone with them due to current Covid guidelines and our immunocompromised population.   Iron Sucrose injection  What is this medicine? IRON SUCROSE (AHY ern SOO krohs) is an iron complex. Iron is used to make healthy red blood cells, which carry oxygen and nutrients throughout the body. This medicine is used to treat iron deficiency anemia in people with chronic kidney disease. This medicine may be used for other purposes; ask your health care provider or pharmacist if you have questions. COMMON BRAND NAME(S): Venofer What should I tell my health care provider before I take this medicine? They need to know if you have any of these conditions:  anemia not caused by low iron levels    heart disease  high levels of iron in the blood  kidney disease  liver disease  an unusual or allergic reaction to iron, other medicines, foods, dyes, or preservatives  pregnant or trying to get pregnant  breast-feeding How should I use this medicine? This medicine is for infusion into a vein. It is given by a health care professional in a hospital or clinic  setting. Talk to your pediatrician regarding the use of this medicine in children. While this drug may be prescribed for children as young as 2 years for selected conditions, precautions do apply. Overdosage: If you think you have taken too much of this medicine contact a poison control center or emergency room at once. NOTE: This medicine is only for you. Do not share this medicine with others. What if I miss a dose? It is important not to miss your dose. Call your doctor or health care professional if you are unable to keep an appointment. What may interact with this medicine? Do not take this medicine with any of the following medications:  deferoxamine  dimercaprol  other iron products This medicine may also interact with the following medications:  chloramphenicol  deferasirox This list may not describe all possible interactions. Give your health care provider a list of all the medicines, herbs, non-prescription drugs, or dietary supplements you use. Also tell them if you smoke, drink alcohol, or use illegal drugs. Some items may interact with your medicine. What should I watch for while using this medicine? Visit your doctor or healthcare professional regularly. Tell your doctor or healthcare professional if your symptoms do not start to get better or if they get worse. You may need blood work done while you are taking this medicine. You may need to follow a special diet. Talk to your doctor. Foods that contain iron include: whole grains/cereals, dried fruits, beans, or peas, leafy green vegetables, and organ meats (liver, kidney). What side effects may I notice from receiving this medicine? Side effects that you should report to your doctor or health care professional as soon as possible:  allergic reactions like skin rash, itching or hives, swelling of the face, lips, or tongue  breathing problems  changes in blood pressure  cough  fast, irregular heartbeat  feeling faint  or lightheaded, falls  fever or chills  flushing, sweating, or hot feelings  joint or muscle aches/pains  seizures  swelling of the ankles or feet  unusually weak or tired Side effects that usually do not require medical attention (report to your doctor or health care professional if they continue or are bothersome):  diarrhea  feeling achy  headache  irritation at site where injected  nausea, vomiting  stomach upset  tiredness This list may not describe all possible side effects. Call your doctor for medical advice about side effects. You may report side effects to FDA at 1-800-FDA-1088. Where should I keep my medicine? This drug is given in a hospital or clinic and will not be stored at home. NOTE: This sheet is a summary. It may not cover all possible information. If you have questions about this medicine, talk to your doctor, pharmacist, or health care provider.  2021 Elsevier/Gold Standard (2011-09-14 17:14:35)  

## 2021-04-28 ENCOUNTER — Ambulatory Visit: Payer: Medicare HMO | Admitting: Internal Medicine

## 2021-04-29 ENCOUNTER — Other Ambulatory Visit: Payer: Self-pay

## 2021-04-29 ENCOUNTER — Ambulatory Visit (INDEPENDENT_AMBULATORY_CARE_PROVIDER_SITE_OTHER): Payer: Medicare HMO

## 2021-04-29 DIAGNOSIS — R0602 Shortness of breath: Secondary | ICD-10-CM

## 2021-04-29 DIAGNOSIS — I10 Essential (primary) hypertension: Secondary | ICD-10-CM

## 2021-04-29 DIAGNOSIS — I25119 Atherosclerotic heart disease of native coronary artery with unspecified angina pectoris: Secondary | ICD-10-CM | POA: Diagnosis not present

## 2021-04-29 DIAGNOSIS — E782 Mixed hyperlipidemia: Secondary | ICD-10-CM

## 2021-04-29 DIAGNOSIS — I5032 Chronic diastolic (congestive) heart failure: Secondary | ICD-10-CM

## 2021-04-30 LAB — ECHOCARDIOGRAM COMPLETE
AR max vel: 2.14 cm2
AV Area VTI: 2.17 cm2
AV Area mean vel: 2.05 cm2
AV Mean grad: 5 mmHg
AV Peak grad: 9 mmHg
Ao pk vel: 1.5 m/s
Calc EF: 57.8 %
S' Lateral: 4 cm
Single Plane A2C EF: 59.6 %
Single Plane A4C EF: 56.2 %

## 2021-05-03 ENCOUNTER — Ambulatory Visit: Payer: Medicare HMO | Admitting: Internal Medicine

## 2021-05-03 DIAGNOSIS — E1165 Type 2 diabetes mellitus with hyperglycemia: Secondary | ICD-10-CM | POA: Diagnosis not present

## 2021-05-03 DIAGNOSIS — E785 Hyperlipidemia, unspecified: Secondary | ICD-10-CM | POA: Diagnosis not present

## 2021-05-03 DIAGNOSIS — I25119 Atherosclerotic heart disease of native coronary artery with unspecified angina pectoris: Secondary | ICD-10-CM | POA: Diagnosis not present

## 2021-05-03 DIAGNOSIS — D6869 Other thrombophilia: Secondary | ICD-10-CM | POA: Diagnosis not present

## 2021-05-03 DIAGNOSIS — G2581 Restless legs syndrome: Secondary | ICD-10-CM | POA: Diagnosis not present

## 2021-05-03 DIAGNOSIS — H547 Unspecified visual loss: Secondary | ICD-10-CM | POA: Diagnosis not present

## 2021-05-03 DIAGNOSIS — J449 Chronic obstructive pulmonary disease, unspecified: Secondary | ICD-10-CM | POA: Diagnosis not present

## 2021-05-03 DIAGNOSIS — I4891 Unspecified atrial fibrillation: Secondary | ICD-10-CM | POA: Diagnosis not present

## 2021-05-03 DIAGNOSIS — G8929 Other chronic pain: Secondary | ICD-10-CM | POA: Diagnosis not present

## 2021-05-04 ENCOUNTER — Encounter: Payer: Self-pay | Admitting: Internal Medicine

## 2021-05-04 ENCOUNTER — Ambulatory Visit (INDEPENDENT_AMBULATORY_CARE_PROVIDER_SITE_OTHER): Payer: Medicare HMO | Admitting: Internal Medicine

## 2021-05-04 ENCOUNTER — Other Ambulatory Visit: Payer: Self-pay

## 2021-05-04 VITALS — BP 125/74 | HR 66 | Temp 98.3°F | Ht 68.23 in | Wt 269.8 lb

## 2021-05-04 DIAGNOSIS — Z135 Encounter for screening for eye and ear disorders: Secondary | ICD-10-CM | POA: Diagnosis not present

## 2021-05-04 DIAGNOSIS — I152 Hypertension secondary to endocrine disorders: Secondary | ICD-10-CM | POA: Diagnosis not present

## 2021-05-04 DIAGNOSIS — E782 Mixed hyperlipidemia: Secondary | ICD-10-CM | POA: Diagnosis not present

## 2021-05-04 DIAGNOSIS — R7989 Other specified abnormal findings of blood chemistry: Secondary | ICD-10-CM | POA: Diagnosis not present

## 2021-05-04 DIAGNOSIS — E1159 Type 2 diabetes mellitus with other circulatory complications: Secondary | ICD-10-CM | POA: Diagnosis not present

## 2021-05-04 DIAGNOSIS — N400 Enlarged prostate without lower urinary tract symptoms: Secondary | ICD-10-CM

## 2021-05-04 NOTE — Progress Notes (Signed)
BP 125/74   Pulse 66   Temp 98.3 F (36.8 C) (Oral)   Ht 5' 8.23" (1.733 m)   Wt 269 lb 12.8 oz (122.4 kg)   SpO2 96%   BMI 40.75 kg/m    Subjective:    Patient ID: Ernest [name], male    DOB: 08-27-1951, 70 y.o.   MRN: [id number]  HPI: Ernest [name] is a 70 y.o. male  HPI  Chief Complaint  Patient presents with  . Anemia    Patient states that he is feeling better  . Echo results  . Diabetes  . Hypertension  . Hyperlipidemia  . COPD    Relevant past medical, surgical, family and social history reviewed and updated as indicated. Interim medical history since our last visit reviewed. Allergies and medications reviewed and updated.  Review of Systems  Per HPI unless specifically indicated above     Objective:    BP 125/74   Pulse 66   Temp 98.3 F (36.8 C) (Oral)   Ht 5' 8.23" (1.733 m)   Wt 269 lb 12.8 oz (122.4 kg)   SpO2 96%   BMI 40.75 kg/m   Wt Readings from Last 3 Encounters:  05/04/21 269 lb 12.8 oz (122.4 kg)  04/11/21 264 lb 11.2 oz (120.1 kg)  04/04/21 270 lb (122.5 kg)    Physical Exam  Results for orders placed or performed in visit on 04/29/21  ECHOCARDIOGRAM COMPLETE  Result Value Ref Range   AR max vel 2.14 cm2   AV Peak grad 9.0 mmHg   Ao pk vel 1.50 m/s   S' Lateral 4.00 cm   AV Area VTI 2.17 cm2   AV Mean grad 5.0 mmHg   Single Plane A4C EF 56.2 %   Single Plane A2C EF 59.6 %   Calc EF 57.8 %   AV Area mean vel 2.05 cm2        Current Outpatient Medications:  .  ACCU-CHEK GUIDE test strip, , Disp: , Rfl:  .  albuterol (VENTOLIN HFA) 108 (90 Base) MCG/ACT inhaler, Inhale 2 puffs into the lungs every 4 (four) hours as needed for wheezing or shortness of breath. (Patient taking differently: Inhale 2 puffs into the lungs every 4 (four) hours as needed for wheezing or shortness of breath. Uses 2-3 times daily), Disp: 18 g, Rfl: 6 .  atorvastatin (LIPITOR) 80 MG tablet, TAKE 1 TABLET(80 MG) BY MOUTH DAILY, Disp: 90  tablet, Rfl: 1 .  carvedilol (COREG) 25 MG tablet, Take 1 tablet (25 mg total) by mouth 2 (two) times daily., Disp: 180 tablet, Rfl: 3 .  clotrimazole-betamethasone (LOTRISONE) cream, Apply 1 application topically 2 (two) times daily., Disp: 30 g, Rfl: 2 .  dapagliflozin propanediol (FARXIGA) 5 MG TABS tablet, Take 1 tablet (5 mg total) by mouth daily before breakfast., Disp: 30 tablet, Rfl: 5 .  diltiazem (CARDIZEM CD) 180 MG 24 hr capsule, Take 1 capsule (180 mg total) by mouth daily., Disp: 90 capsule, Rfl: 3 .  Fluticasone-Salmeterol (ADVAIR) 100-50 MCG/DOSE AEPB, Inhale 1 puff into the lungs 2 (two) times daily., Disp: 1 each, Rfl: 3 .  furosemide (LASIX) 40 MG tablet, Take 0.5 tablets (20 mg total) by mouth 2 (two) times daily., Disp: 180 tablet, Rfl: 1 .  gabapentin (NEURONTIN) 600 MG tablet, TAKE 2 TABLETS(1200 MG) BY MOUTH AT BEDTIME, Disp: 180 tablet, Rfl: 0 .  isosorbide mononitrate (IMDUR) 60 MG 24 hr tablet, TAKE 1 TABLET(60 MG) BY MOUTH DAILY, Disp:  30 tablet, Rfl: 3 .  metFORMIN (GLUCOPHAGE-XR) 500 MG 24 hr tablet, TAKE 2 TABLETS(1000 MG) BY MOUTH TWICE DAILY, Disp: 360 tablet, Rfl: 1 .  nitroGLYCERIN (NITROSTAT) 0.4 MG SL tablet, Place 1 tablet (0.4 mg total) under the tongue every 5 (five) minutes as needed for chest pain., Disp: 25 tablet, Rfl: 2 .  pantoprazole (PROTONIX) 40 MG tablet, TAKE 1 TABLET(40 MG) BY MOUTH DAILY, Disp: 90 tablet, Rfl: 2 .  ramipril (ALTACE) 10 MG capsule, Take 1 capsule (10 mg total) by mouth daily., Disp: 90 capsule, Rfl: 0 .  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 Bonna Gains  He has had a EGD and colonoscopy  2019 : showed Barrett's esophagus   2. 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 Mild to moderate mitral regurgitation in the setting of catheter-induced non-sustained ventricular tachycardia. 2. Successful PCI to mid LAD using Synergy 2.5 x 24 mm drug-eluting stent (post-dilated to 2.8 mm) with 0% residual stenosis and TIMI-3 flow.  ECHO shows dilated which is moderately dilated, mildly enlarged right ventricular size as well as moderately elevated pulmonary artery systolic pressures.   3. DM : Stop invokana and glipizide Results for Ernest [name] (MRN [id number]) as of 05/06/2021 11:16  Ref. Range 03/17/2021 10:19  HB A1C (BAYER DCA - WAIVED) Latest Ref Range: <7.0 % 6.5   Continue metformin 1000 mg bid.  Add farxiga or jardiance depending on labs.  check HbA1c,  urine  microalbumin  diabetic diet plan given to pt  adviced regarding hypoglycemia and instructions given to pt today on how to prevent and treat the same if it were to occur. pt acknowledges the plan and voices understanding of the same.  exercise plan given and encouraged.   advice diabetic yearly podiatry, ophthalmology , nutritionist , dental check q 6 months,  2. HTN Metoprolol 25 mg for such and ramipril 10 mg daily as well as cardizem 180 mg  To fu with cards in [place]  Continue current meds.  Medication compliance emphasised. pt advised to keep Bp logs. Pt verbalised understanding of the same. Pt to have a low salt diet . Exercise to reach a goal of at least 150 mins a week.  lifestyle modifications explained and pt understands importance of the above.  3. HLd is on lipitor 80 mg daily recheck FLP, check LFT's work on diet, SE of meds explained to pt. low fat and high fiber diet explained to pt.  4.  Afib needs to fu with Dr. Rockey Situ - is on xareto , b blockers , cardizem plavix and aspirin  Will need a fu asap. Was also rx lasix pt unaware why ? CHF not been seen in 2 yrs,  Will re- refer.  D/w Dr. Rockey Situ pt will be seen by their office.   Problem List Items Addressed This Visit      Cardiovascular and Mediastinum  Hypertension associated with diabetes ([place]) - Primary   Relevant Orders   Comprehensive metabolic panel   CBC with Differential/Platelet   Bayer DCA Hb A1c Waived   Microalbumin, Urine Waived     Genitourinary   BPH (benign prostatic hyperplasia)   Relevant Orders   PSA Total+%Free ([place])     Other   Elevated TSH   Relevant Orders   Thyroid Panel With TSH    Other Visit Diagnoses    Screening for diabetic retinopathy       Relevant Orders   Ambulatory referral to Ophthalmology   Mixed hyperlipidemia       Relevant Orders   Lipid panel      Follow up plan: No follow-ups on file.   CBC, CMP, FLP, HBA1C, TSH, PSA, urine microalbumin Labs 1 week prior to next visit.

## 2021-05-08 ENCOUNTER — Other Ambulatory Visit: Payer: Self-pay | Admitting: Nurse Practitioner

## 2021-05-08 ENCOUNTER — Encounter: Payer: Self-pay | Admitting: Pharmacist

## 2021-05-08 DIAGNOSIS — I482 Chronic atrial fibrillation, unspecified: Secondary | ICD-10-CM

## 2021-05-08 NOTE — Telephone Encounter (Signed)
Requested Prescriptions  Pending Prescriptions Disp Refills  . XARELTO 20 MG TABS tablet [Pharmacy Med Name: XARELTO 20MG  TABLETS] 90 tablet 0    Sig: TAKE 1 TABLET(20 MG) BY MOUTH DAILY WITH SUPPER     Hematology: Anticoagulants - rivaroxaban Failed - 05/08/2021 10:06 AM      Failed - HCT in normal range and within 360 days    HCT  Date Value Ref Range Status  04/04/2021 35.6 (L) 39.0 - 52.0 % Final   Hematocrit  Date Value Ref Range Status  03/17/2021 35.7 (L) 37.5 - 51.0 % Final         Failed - HGB in normal range and within 360 days    Hemoglobin  Date Value Ref Range Status  04/04/2021 10.6 (L) 13.0 - 17.0 g/dL Final  03/17/2021 10.7 (L) 13.0 - 17.7 g/dL Final         Passed - ALT in normal range and within 180 days    ALT  Date Value Ref Range Status  04/04/2021 15 0 - 44 U/L Final   ALT (SGPT) Piccolo, Waived  Date Value Ref Range Status  08/01/2017 33 10 - 47 U/L Final         Passed - AST in normal range and within 180 days    AST  Date Value Ref Range Status  04/04/2021 21 15 - 41 U/L Final   AST (SGOT) Piccolo, Waived  Date Value Ref Range Status  08/01/2017 34 11 - 38 U/L Final         Passed - Cr in normal range and within 360 days    Creatinine  Date Value Ref Range Status  07/25/2014 1.21 0.60 - 1.30 mg/dL Final   Creatinine, Ser  Date Value Ref Range Status  04/04/2021 1.19 0.61 - 1.24 mg/dL Final         Passed - PLT in normal range and within 360 days    Platelets  Date Value Ref Range Status  04/04/2021 204 150 - 400 K/uL Final  03/17/2021 200 150 - 450 x10E3/uL Final         Passed - Valid encounter within last 12 months    Recent Outpatient Visits          4 days ago Hypertension associated with diabetes ([place])   [facility] [name], [name], MD   1 month ago Anemia, unspecified type   [facility] [name], [name], MD   1 month ago Restless legs   [place] [name], [name], MD   4 months ago  Appointment canceled by hospital   [facility], Megan P, DO   5 months ago Type 2 diabetes mellitus with morbid obesity ([place])   [place], Barbaraann Faster, NP      Future Appointments            In 3 days Virgel Manifold, MD [facility]   In 4 days [facility], [employer], PA-C [place]   In 1 month [name], [name], MD [employer]   In 9 months  [employer] [facility]

## 2021-05-08 NOTE — Patient Instructions (Addendum)
Visit Information  It was a pleasure speaking with you today! Thank you for letting me be a part of your care team. Please call with any questions or concerns.  Goals Addressed            This Visit's Progress   . Monitor and Manage My Blood Sugar-Diabetes Type 2       Timeframe:  Long-Range Goal Priority:  High Start Date:                             Expected End Date:                       Follow Up Date 3 month follow up    - check blood sugar at prescribed times - check blood sugar if I feel it is too high or too low - enter blood sugar readings and medication or insulin into daily log - take the blood sugar meter to all doctor visits    Why is this important?    Checking your blood sugar at home helps to keep it from getting very high or very low.   Writing the results in a diary or log helps the doctor know how to care for you.   Your blood sugar log should have the time, date and the results.   Also, write down the amount of insulin or other medicine that you take.   Other information, like what you ate, exercise done and how you were feeling, will also be helpful.     Notes:     . Obtain Eye Exam-Diabetes Type 2       Follow Up Date  3 month follow up   - schedule appointment with eye doctor    Why is this important?    Eye check-ups are important when you have diabetes.   Vision loss can be prevented.    Notes:     . Track and Manage Fluids and Swelling-Heart Failure       Timeframe:  Short-Term Goal Priority:  High Start Date:                             Expected End Date:                       Follow Up Date 3 month follow up   - call office if I gain more than 2 pounds in one day or 5 pounds in one week - do ankle pumps when sitting - meet with dietitian - track weight in diary - watch for swelling in feet, ankles and legs every day - weigh myself daily    Why is this important?    It is important to check your weight daily and watch how  much salt and liquids you have.   It will help you to manage your heart failure.    Notes:        Mr. Kritikos was given information about Chronic Care Management services today including:  1. CCM service includes personalized support from designated clinical staff supervised by his physician, including individualized plan of care and coordination with other care providers 2. 24/7 contact phone numbers for assistance for urgent and routine care needs. 3. Standard insurance, coinsurance, copays and deductibles apply for chronic care management only during months in which we provide at least 20  minutes of these services. Most insurances cover these services at 100%, however patients may be responsible for any copay, coinsurance and/or deductible if applicable. This service may help you avoid the need for more expensive face-to-face services. 4. Only one practitioner may furnish and bill the service in a calendar month. 5. The patient may stop CCM services at any time (effective at the end of the month) by phone call to the office staff.  Patient agreed to services and verbal consent obtained.   The patient verbalized understanding of instructions, educational materials, and care plan provided today and agreed to receive a mailed copy of patient instructions, educational materials, and care plan.  Telephone follow up appointment with pharmacy team member scheduled for: 1 month CPA, 3 months PharmD  Junita Push. Kenton Kingfisher PharmD, BCPS Clinical Pharmacist [phone]  Food Basics for Chronic Kidney Disease Chronic kidney disease (CKD) occurs when the kidneys are permanently damaged over a long period of time. When your kidneys are not working well, they cannot remove waste, fluids, and other substances from your blood as well as they did before. The substances can build up, which can worsen kidney damage and affect how your body functions. Certain foods lead to a buildup of these substances. By  changing your diet, you can help prevent more kidney damage and delay or prevent the need for dialysis. What are tips for following this plan? Reading food labels  Check the amount of salt (sodium) in foods. Choose foods that have less than 300 milligrams (mg) per serving.  Check the ingredient list for phosphorus or potassium-based additives or preservatives.  Check the amount of saturated fat and trans fat. Limit or avoid these fats as told by your dietitian. Shopping  Avoid buying foods that are: ? Processed or prepackaged. ? Calcium-enriched or that have calcium added to them (are fortified).  Do not buy foods that have salt or sodium listed among the first five ingredients.  Buy canned vegetables and beans that say "no salt added" or "low sodium" and rinse them before eating. Cooking  Soak vegetables, such as potatoes, before cooking to reduce potassium. To do this: 1. Peel and cut the vegetables into small pieces. 2. Soak the vegetables in warm water for at least 2 hours. For every 1 cup of vegetables, use 10 cups of water. 3. Drain and rinse the vegetables with warm water. 4. Boil the vegetables for at least 5 minutes. Meal planning  Limit the amount of protein you eat from plant and animal sources each day.  Do not add salt to food when cooking or before eating.  Eat meals and snacks at around the same time each day. General information  Talk with your health care provider about whether you should take a vitamin and mineral supplement.  Use standard measuring cups and spoons to measure servings of foods. Use a kitchen scale to measure portions of protein foods.  If told by your health care provider, avoid drinking too much fluid. Measure and count all liquids, including water, ice, soups, flavored gelatin, and frozen desserts such as ice pops or ice cream. If you have diabetes:  If you have diabetes (diabetes mellitus) and CKD, it is important to keep your blood sugar  (glucose) in the target range recommended by your health care provider. Follow your diabetes management plan. This may include: ? Checking your blood glucose regularly. ? Taking medicines by mouth, taking insulin, or taking both. ? Exercising for at least 30 minutes on 5 or  more days each week, or as told by your health care provider. ? Tracking how many servings of carbohydrates you eat at each meal.  You may be given specific guidelines on how much of certain foods and nutrients you may eat, depending on your stage of kidney disease and whether you have high blood pressure (hypertension). Follow your meal plan as told by your dietitian. What nutrients should I limit? Work with your health care provider and dietitian to develop a meal plan that is right for you. Foods you can eat and foods you should limit or avoid will depend on the stage of your kidney disease and any other health conditions you have. The items listed below are not a complete list. Talk with your dietitian about what dietary choices are best for you. Potassium Potassium affects how steadily your heart beats. If too much potassium builds up in your blood, the potassium can cause an irregular heartbeat or even a heart attack. You may need to limit or avoid foods that are high in potassium, such as:  Milk and soy milk.  Fruits, such as bananas, apricots, nectarines, melon, prunes, raisins, kiwi, and oranges.  Vegetables, such as potatoes, sweet potatoes, yams, tomatoes, leafy greens, beets, avocado, pumpkin, and winter squash.  White and lima beans.  Whole-wheat breads and pastas.  Beans and nuts. Phosphorus Phosphorus is a mineral found in your bones. A balance between calcium and phosphorus is needed to build and maintain healthy bones. Too much phosphorus pulls calcium from your bones. This can make your bones weak and more likely to break. Too much phosphorus can also make your skin itch. You may need to limit or avoid  foods that are high in phosphorus, such as:  Milk and dairy products.  Dried beans and peas.  Tofu, soy milk, and other soy-based meat replacements.  Dark-colored sodas.  Nuts and peanut butter.  Meat, poultry, and fish.  Bran cereals and oatmeal. Protein Protein helps you make and keep muscle. It also helps to repair your body's cells and tissues. One of the natural breakdown products of protein is a waste product called urea. When your kidneys are not working properly, they cannot remove wastes, such as urea. Reducing how much protein you eat can help prevent a buildup of urea in your blood. Depending on your stage of kidney disease, you may need to limit foods that are high in protein. Sources of animal protein include:  Meat (all types).  Fish and seafood.  Poultry.  Eggs.  Dairy. Other protein foods include:  Beans and legumes.  Nuts and nut butter.  Soy and tofu.   Sodium Sodium helps to maintain a healthy balance of fluids in your body. Too much sodium can increase your blood pressure and have a negative effect on your heart and lungs. Too much sodium can also cause your body to retain too much fluid, making your kidneys work harder. Most people should have less than 2,300 mg of sodium each day. If you have hypertension, you may need to limit your sodium to 1,500 mg each day. You may need to limit or avoid foods that are high in sodium, such as:  Salt seasonings.  Soy sauce.  Cured and processed meats.  Salted crackers and snack foods.  Fast food.  Canned soups and most canned foods.  Pickled foods.  Vegetable juice.  Boxed mixes or ready-to-eat boxed meals and side dishes.  Bottled dressings, sauces, and marinades. Talk with your dietitian about how much  potassium, phosphorus, protein, and sodium you may have each day. Summary  Chronic kidney disease (CKD) can lead to a buildup of waste and extra substances in the body. Certain foods lead to a  buildup of these substances. By changing your diet as told, you can help prevent more kidney damage and delay or prevent the need for dialysis.  Food intake changes are different for each person with CKD. Work with a dietitian to set up nutrient goals and a meal plan that is right for you.  If you have diabetes and CKD, it is important to keep your blood sugar in the target range recommended by your health care provider. This information is not intended to replace advice given to you by your health care provider. Make sure you discuss any questions you have with your health care provider. Document Revised: 03/29/2020 Document Reviewed: 03/29/2020 Elsevier Patient Education  2021 [employer].

## 2021-05-11 ENCOUNTER — Other Ambulatory Visit: Payer: Self-pay

## 2021-05-11 ENCOUNTER — Ambulatory Visit: Payer: Medicare HMO | Admitting: Gastroenterology

## 2021-05-11 VITALS — BP 128/77 | HR 75 | Wt 266.4 lb

## 2021-05-11 DIAGNOSIS — D500 Iron deficiency anemia secondary to blood loss (chronic): Secondary | ICD-10-CM

## 2021-05-11 DIAGNOSIS — K5520 Angiodysplasia of colon without hemorrhage: Secondary | ICD-10-CM

## 2021-05-11 DIAGNOSIS — K31819 Angiodysplasia of stomach and duodenum without bleeding: Secondary | ICD-10-CM

## 2021-05-11 DIAGNOSIS — Z86010 Personal history of colonic polyps: Secondary | ICD-10-CM

## 2021-05-11 MED ORDER — PEG 3350-KCL-NA BICARB-NACL 420 G PO SOLR: ORAL | 0 refills | Status: DC

## 2021-05-11 NOTE — Progress Notes (Signed)
Ernest Antigua, MD [address]  [place]  [place], [place] [address]  Main: [phone]  Fax: [phone]   Primary Care Physician: Ernest Cousins, MD   Chief Complaint  Patient presents with  . Anemia    HPI: Ernest [name] is a 70 y.o. 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  Anemia had resolved but now has recurred and patient is reporting fatigue.  He is following up with hematology for iron infusions.  Patient has not seen any signs of active bleeding.  No abdominal pain, nausea or vomiting, weight loss.  Current Outpatient Medications  Medication Sig Dispense Refill  . ACCU-CHEK GUIDE test strip     . albuterol (VENTOLIN HFA) 108 (90 Base) MCG/ACT inhaler Inhale 2 puffs into the lungs every 4 (four) hours as needed for wheezing or shortness of breath. (Patient taking differently: Inhale 2 puffs into the lungs every 4 (four) hours as needed for wheezing or shortness of breath. Uses 2-3 times daily) 18 g 6  . atorvastatin (LIPITOR) 80 MG tablet TAKE 1 TABLET(80 MG) BY MOUTH DAILY 90 tablet 1  . carvedilol (COREG) 25 MG tablet Take 1 tablet (25 mg total) by mouth 2 (two) times daily. 180 tablet 3  . clotrimazole-betamethasone (LOTRISONE) cream Apply 1 application topically 2 (two) times daily. 30 g 2  . dapagliflozin propanediol (FARXIGA) 5 MG TABS tablet Take 1 tablet (5 mg total) by mouth daily before breakfast. 30 tablet 5  . diltiazem (CARDIZEM CD) 180 MG 24 hr capsule Take 1 capsule (180 mg total) by mouth daily. 90 capsule 3  . Fluticasone-Salmeterol (ADVAIR) 100-50 MCG/DOSE AEPB Inhale 1 puff into the lungs 2 (two) times daily. 1 each 3  . furosemide (LASIX) 40 MG tablet Take 0.5 tablets (20 mg total) by mouth 2 (two) times daily. 180 tablet 1  . gabapentin (NEURONTIN) 600 MG tablet TAKE 2 TABLETS(1200 MG) BY MOUTH AT  BEDTIME 180 tablet 0  . isosorbide mononitrate (IMDUR) 60 MG 24 hr tablet TAKE 1 TABLET(60 MG) BY MOUTH DAILY 30 tablet 3  . metFORMIN (GLUCOPHAGE-XR) 500 MG 24 hr tablet TAKE 2 TABLETS(1000 MG) BY MOUTH TWICE DAILY 360 tablet 1  . nitroGLYCERIN (NITROSTAT) 0.4 MG SL tablet Place 1 tablet (0.4 mg total) under the tongue every 5 (five) minutes as needed for chest pain. 25 tablet 2  . pantoprazole (PROTONIX) 40 MG tablet TAKE 1 TABLET(40 MG) BY MOUTH DAILY 90 tablet 2  . polyethylene glycol-electrolytes (NULYTELY) 420 g solution 8,000 mL for 2 doses 8000 mL 0  . ramipril (ALTACE) 10 MG capsule Take 1 capsule (10 mg total) by mouth daily. 90 capsule 0  . XARELTO 20 MG TABS tablet TAKE 1 TABLET(20 MG) BY MOUTH DAILY WITH SUPPER 90 tablet 0   No current facility-administered medications for this visit.    Allergies as of 05/11/2021 - Review Complete 05/11/2021  Allergen Reaction Noted  . Doxycycline Other (See Comments) 02/03/2019    ROS:  General: Negative for anorexia, weight loss, fever, chills, fatigue, weakness. ENT: Negative for hoarseness, difficulty swallowing , nasal congestion. CV: Negative for chest pain, angina, palpitations, dyspnea on exertion, peripheral edema.  Respiratory: Negative for dyspnea at rest, dyspnea on exertion, cough, sputum, wheezing.  GI: See history of present illness. GU:  Negative for dysuria, hematuria, urinary incontinence, urinary frequency, nocturnal urination.  Endo: Negative for unusual weight change.    Physical Examination:   BP 128/77   Pulse 75   Wt 266 lb 6.4 oz (120.8 kg)   BMI 40.24 kg/m   General: Well-nourished, well-developed in no acute distress.  Eyes: No icterus. Conjunctivae pink. Mouth: Oropharyngeal mucosa moist and pink , no lesions erythema or exudate. Neck: Supple, Trachea midline Abdomen: Bowel sounds are normal, nontender, nondistended, no hepatosplenomegaly or masses, no abdominal bruits or hernia , no rebound or  guarding.   Extremities: No lower extremity edema. No clubbing or deformities. Neuro: Alert and oriented x 3.  Grossly intact. Skin: Warm and dry, no jaundice.   Psych: Alert and cooperative, normal mood and affect.   Labs: CMP     Component Value Date/Time   NA 141 04/04/2021 1135   NA 141 03/17/2021 1005   NA 139 07/25/2014 0425   K 4.0 04/04/2021 1135   K 3.7 07/25/2014 0425   CL 105 04/04/2021 1135   CL 105 07/25/2014 0425   CO2 26 04/04/2021 1135   CO2 29 07/25/2014 0425   GLUCOSE 154 (H) 04/04/2021 1135   GLUCOSE 125 (H) 07/25/2014 0425   BUN 17 04/04/2021 1135   BUN 15 03/17/2021 1005   BUN 16 07/25/2014 0425   CREATININE 1.19 04/04/2021 1135   CREATININE 1.21 07/25/2014 0425   CALCIUM 9.1 04/04/2021 1135   CALCIUM 8.9 07/25/2014 0425   PROT 7.0 04/04/2021 1135   PROT 6.7 03/17/2021 1005   ALBUMIN 4.1 04/04/2021 1135   ALBUMIN 4.5 03/17/2021 1005   AST 21 04/04/2021 1135   AST 34 08/01/2017 0902   ALT 15 04/04/2021 1135   ALT 33 08/01/2017 0902   ALKPHOS 67 04/04/2021 1135   BILITOT 1.7 (H) 04/04/2021 1135   BILITOT 1.2 03/17/2021 1005   GFRNONAA >60 04/04/2021 1135   GFRNONAA >60 07/25/2014 0425   GFRAA 81 11/19/2020 1109   GFRAA >60 07/25/2014 0425   Lab Results  Component Value Date   WBC 7.6 04/04/2021   HGB 10.6 (L) 04/04/2021   HCT 35.6 (L) 04/04/2021   MCV 80.4 04/04/2021   PLT 204 04/04/2021    Imaging Studies: ECHOCARDIOGRAM COMPLETE  Addendum Date: 04/30/2021   Left atrium moderately dilated. Right atrium mildly dilated. [id number] Electronically Amended 04/30/2021, 2:23 PM   Final Ernest [name])    Result Date: 04/30/2021    ECHOCARDIOGRAM REPORT   Patient Name:   Ernest [name] Date of Exam: 04/29/2021 Medical Rec #:  [id number]           Height:       70.0 in Accession #:    [id number]          Weight:       264.7 lb Date of Birth:  October 26, 1951           BSA:          2.351 m Patient Age:    18 years            BP:           130/72 mmHg Patient  Gender: M                   HR:           76 bpm. Exam Location:  [facility] Procedure: 2D Echo, Cardiac Doppler and Color Doppler Indications:    I48.91* Unspeicified atrial fibrillation; R06.02 SOB  History:        Patient has prior history  of Echocardiogram examinations, most                 recent 03/07/2019. CHF, CAD and Previous Myocardial Infarction,                 COPD, Arrythmias:Atrial Fibrillation, Signs/Symptoms:Shortness                 of Breath; Risk Factors:Sleep Apnea, Former Smoker, Diabetes,                 Hypertension and Dyslipidemia.  Sonographer:    Pilar Jarvis RDMS, RVT, RDCS Referring Phys: [id number] [place]  1. Left ventricular ejection fraction, by estimation, is 60 to 65%. The left ventricle has normal function. The left ventricle has no regional wall motion abnormalities. The left ventricular internal cavity size was mildly dilated. Left ventricular diastolic parameters are indeterminate.  2. Right ventricular systolic function is normal. The right ventricular size is mildly enlarged. There is moderately elevated pulmonary artery systolic pressure. The estimated right ventricular systolic pressure is 02.6 mmHg.  3. Right atrial size was moderately dilated.  4. The mitral valve is normal in structure. Mild mitral valve regurgitation. FINDINGS  Left Ventricle: Left ventricular ejection fraction, by estimation, is 60 to 65%. The left ventricle has normal function. The left ventricle has no regional wall motion abnormalities. The left ventricular internal cavity size was mildly dilated. There is  no left ventricular hypertrophy. Left ventricular diastolic parameters are indeterminate. Right Ventricle: The right ventricular size is mildly enlarged. No increase in right ventricular wall thickness. Right ventricular systolic function is normal. There is moderately elevated pulmonary artery systolic pressure. The tricuspid regurgitant velocity is 3.43 m/s, and with an assumed  right atrial pressure of 5 mmHg, the estimated right ventricular systolic pressure is 37.8 mmHg. Left Atrium: Left atrial size was normal in size. Right Atrium: Right atrial size was moderately dilated. Pericardium: There is no evidence of pericardial effusion. Mitral Valve: The mitral valve is normal in structure. Mild mitral annular calcification. Mild mitral valve regurgitation. No evidence of mitral valve stenosis. Tricuspid Valve: The tricuspid valve is normal in structure. Tricuspid valve regurgitation is mild . No evidence of tricuspid stenosis. Aortic Valve: The aortic valve was not well visualized. Aortic valve regurgitation is not visualized. Mild aortic valve sclerosis is present, with no evidence of aortic valve stenosis. Aortic valve mean gradient measures 5.0 mmHg. Aortic valve peak gradient measures 9.0 mmHg. Aortic valve area, by VTI measures 2.17 cm. Pulmonic Valve: The pulmonic valve was normal in structure. Pulmonic valve regurgitation is not visualized. No evidence of pulmonic stenosis. Aorta: The aortic root is normal in size and structure. Venous: The inferior vena cava is normal in size with greater than 50% respiratory variability, suggesting right atrial pressure of 3 mmHg. IAS/Shunts: No atrial level shunt detected by color flow Doppler.  LEFT VENTRICLE PLAX 2D LVIDd:         5.90 cm LVIDs:         4.00 cm LV PW:         1.10 cm LV IVS:        1.10 cm LVOT diam:     2.00 cm LV SV:         71 LV SV Index:   30 LVOT Area:     3.14 cm  LV Volumes (MOD) LV vol d, MOD A2C: 98.2 ml LV vol d, MOD A4C: 87.5 ml LV vol s, MOD A2C: 39.7 ml LV  vol s, MOD A4C: 38.3 ml LV SV MOD A2C:     58.5 ml LV SV MOD A4C:     87.5 ml LV SV MOD BP:      54.1 ml RIGHT VENTRICLE            IVC RV Basal diam:  3.20 cm    IVC diam: 2.60 cm RV S prime:     8.70 cm/s TAPSE (M-mode): 2.3 cm LEFT ATRIUM              Index LA diam:        5.45 cm  2.32 cm/m LA Vol (A2C):   114.0 ml 48.48 ml/m LA Vol (A4C):   99.3 ml   42.23 ml/m LA Biplane Vol: 107.0 ml 45.51 ml/m  AORTIC VALVE                    PULMONIC VALVE AV Area (Vmax):    2.14 cm     PV Vmax:       0.72 m/s AV Area (Vmean):   2.05 cm     PV Peak grad:  2.1 mmHg AV Area (VTI):     2.17 cm AV Vmax:           150.00 cm/s AV Vmean:          109.000 cm/s AV VTI:            0.329 m AV Peak Grad:      9.0 mmHg AV Mean Grad:      5.0 mmHg LVOT Vmax:         102.00 cm/s LVOT Vmean:        71.200 cm/s LVOT VTI:          0.227 m LVOT/AV VTI ratio: 0.69  AORTA Ao Root diam: 2.80 cm Ao Asc diam:  3.20 cm TRICUSPID VALVE TR Peak grad:   47.1 mmHg TR Vmax:        343.00 cm/s  SHUNTS Systemic VTI:  0.23 m Systemic Diam: 2.00 cm Ida Rogue MD Electronically signed by Ida Rogue MD Signature Date/Time: 04/30/2021/2:22:38 PM    Final (Amended)     Assessment and Plan:   Ernest [name] is a 70 y.o. y/o male here for iron deficiency anemia with previous history of AVMs in the small bowel, with no evidence of active GI bleeding  Patient will need repeat push enteroscopy (given that AVMs were seen in the small bowel that needed cautery through which enteroscopy previously, it is best to proceed with push enteroscopy instead of upper endoscopy to target these lesions if still present)  On last colonoscopy procedure he was recommended to have repeat colonoscopy for evaluation of polyps in 3 years that he is due for this now as well  I have discussed alternative options, risks & benefits,  which include, but are not limited to, bleeding, infection, perforation,respiratory complication & drug reaction.  The patient agrees with this plan & written consent will be obtained.       Dr Ernest [name]

## 2021-05-12 ENCOUNTER — Encounter: Payer: Self-pay | Admitting: Medical

## 2021-05-12 ENCOUNTER — Ambulatory Visit: Payer: Medicare HMO | Admitting: Medical

## 2021-05-12 ENCOUNTER — Telehealth: Payer: Self-pay

## 2021-05-12 VITALS — BP 142/84 | HR 79 | Ht 68.23 in | Wt 266.0 lb

## 2021-05-12 DIAGNOSIS — D649 Anemia, unspecified: Secondary | ICD-10-CM | POA: Diagnosis not present

## 2021-05-12 DIAGNOSIS — I25119 Atherosclerotic heart disease of native coronary artery with unspecified angina pectoris: Secondary | ICD-10-CM | POA: Diagnosis not present

## 2021-05-12 DIAGNOSIS — I5032 Chronic diastolic (congestive) heart failure: Secondary | ICD-10-CM | POA: Diagnosis not present

## 2021-05-12 DIAGNOSIS — I10 Essential (primary) hypertension: Secondary | ICD-10-CM | POA: Diagnosis not present

## 2021-05-12 DIAGNOSIS — E782 Mixed hyperlipidemia: Secondary | ICD-10-CM | POA: Diagnosis not present

## 2021-05-12 NOTE — Patient Instructions (Signed)
Medication Instructions:   1. Your physician recommends that you continue on your current medications as directed. Please refer to the Current Medication list given to you today.  *If you need a refill on your cardiac medications before your next appointment, please call your pharmacy*   Lab Work:  1. None Ordered  If you have labs (blood work) drawn today and your tests are completely normal, you will receive your results only by: Marland Kitchen MyChart Message (if you have MyChart) OR . A paper copy in the mail If you have any lab test that is abnormal or we need to change your treatment, we will call you to review the results.   Testing/Procedures:  1. None Ordered   Follow-Up: At [facility], you and your health needs are our priority.  As part of our continuing mission to provide you with exceptional heart care, we have created designated Provider Care Teams.  These Care Teams include your primary Cardiologist (physician) and Advanced Practice Providers (APPs -  Physician Assistants and Nurse Practitioners) who all work together to provide you with the care you need, when you need it.  We recommend signing up for the patient portal called "MyChart".  Sign up information is provided on this After Visit Summary.  MyChart is used to connect with patients for Virtual Visits (Telemedicine).  Patients are able to view lab/test results, encounter notes, upcoming appointments, etc.  Non-urgent messages can be sent to your provider as well.   To learn more about what you can do with MyChart, go to [email or web address].    Your next appointment:   6 month(s)  The format for your next appointment:   In Person  Provider:   Ida Rogue, MD

## 2021-05-12 NOTE — Progress Notes (Signed)
Cardiology Office Note:    Date:  05/12/2021   ID:  Ernest [name], DOB 1951-05-31, MRN [id number]  PCP:  [name], MD  [facility] Cardiologist:  Ida Rogue, MD  [place] Electrophysiologist:  None   Referring MD: [name], MD   Chief Complaint: 5-6 week f/u  History of Present Illness:    Ernest [name] is a 70 y.o. male with a hx of permanent A. fib on Xarelto, obesity, diabetes type 2, hypertension, hyperlipidemia, GERD, COPD, CAD with PCI to the LAD 03/07/2019 presents for follow-up.  In 2015 the patient had a cardiac catheterization showing nonobstructive CAD.  In 03/07/2019 patient presented with chest pain found to have non-STEMI.  Cath showed severe single-vessel CAD with 80% mid LAD stenosis, treated with successful PCI/DES x1.  Nonobstructive LMCA, left circumflex, RCA disease, normal EF, mild to moderate MR in the setting of catheter induced nonsustained VT.  Seen on 03/18/2021 and blood pressure was elevated.  Metoprolol was increased from 25 to 50.  Plavix was stopped since it was 1 year post stent.  Last seen 04/04/2021 and ported shortness of breath.  Blood pressure elevated.  Metoprolol was changed to Coreg 25 mg twice daily.  Echo was ordered.  Echo shown LVEF 60 to 65%, mildly dilated LV, normal RV function, moderately elevated pulmonary artery systolic pressure, moderately dilated right atrium, mild MR.  Today, echo was reviewed in detail. the patient reports dizzy spell the other day. It happened when he stood up. He checked his vitals and SBP was in the 90s. Since then he has not had another spell and has been taking it as prescribed. He forgot to take mediations this AM. BP at home after medications 140-150/70. In the last few weeks he reports 2 mechanical falls. He had a mechanical fall walking up the stairs one day and the second he tripped over his shoes and fell on his knee. Patient says breathing is better, feels he can walk further. He  has stable angina. No LLE, orthopnea, pnd. Plan for EGD/colonoscopy next month. Orthostatics negative.   Past Medical History:  Diagnosis Date  . Anemia   . Asthma   . COPD (chronic obstructive pulmonary disease) ([place])   . Diabetes mellitus    Type II  . Early Pulmonary fibrosis ([place])   . GERD (gastroesophageal reflux disease)   . History of echocardiogram    a. 02/2017 Echo: EF 60-65%, no rwma, mild MR, mod dil LA. Nl RV fxn. PASP 46mmHg.  Marland Kitchen Hyperlipidemia   . Hypertension   . Morbid obesity ([place])   . Non-obstructive CAD (coronary artery disease)    a. 2011 Cath: nonobs dzs; b. 07/2014 Cath: LM 30, LAD nl, LCX nl, RCA 20p, 67m.  Marland Kitchen Permanent atrial fibrillation (HCC)    a. CHA2DS2VASc = 4-->xarelto.  . Polyp of sigmoid colon     Past Surgical History:  Procedure Laterality Date  . ANKLE SURGERY    . CARDIAC CATHETERIZATION  05/2010   [facility]  . CARDIAC CATHETERIZATION     [facility]  . CARDIAC CATHETERIZATION     [facility]  . CARDIAC CATHETERIZATION  07/2014   [facility]  . COLONOSCOPY    . COLONOSCOPY WITH PROPOFOL N/A 06/26/2018   Procedure: COLONOSCOPY WITH PROPOFOL;  Surgeon: Virgel Manifold, MD;  Location: [facility] ENDOSCOPY;  Service: Endoscopy;  Laterality: N/A;  . CORONARY STENT INTERVENTION N/A 03/07/2019   Procedure: CORONARY STENT INTERVENTION;  Surgeon: Nelva Bush, MD;  Location: [place] [facility];  Service: Cardiovascular;  Laterality: N/A;  . ENTEROSCOPY N/A 08/05/2018   Procedure: ENTEROSCOPY;  Surgeon: Virgel Manifold, MD;  Location: [facility] ENDOSCOPY;  Service: Endoscopy;  Laterality: N/A;  . ESOPHAGOGASTRODUODENOSCOPY (EGD) WITH PROPOFOL N/A 06/26/2018   Procedure: ESOPHAGOGASTRODUODENOSCOPY (EGD) WITH PROPOFOL;  Surgeon: Virgel Manifold, MD;  Location: [facility] ENDOSCOPY;  Service: Endoscopy;  Laterality: N/A;  . LEFT HEART CATH AND CORONARY ANGIOGRAPHY N/A 03/07/2019   Procedure: LEFT HEART CATH AND CORONARY ANGIOGRAPHY;  Surgeon: Nelva Bush, MD;  Location: [place] [facility];  Service: Cardiovascular;  Laterality: N/A;  . TESTICLE SURGERY  70's  . TUMOR EXCISION     Neck and finger; benign  . WRIST SURGERY      Current Medications: Current Meds  Medication Sig  . ACCU-CHEK GUIDE test strip   . albuterol (VENTOLIN HFA) 108 (90 Base) MCG/ACT inhaler Inhale 2 puffs into the lungs every 4 (four) hours as needed for wheezing or shortness of breath. (Patient taking differently: Inhale 2 puffs into the lungs every 4 (four) hours as needed for wheezing or shortness of breath. Uses 2-3 times daily)  . atorvastatin (LIPITOR) 80 MG tablet TAKE 1 TABLET(80 MG) BY MOUTH DAILY  . carvedilol (COREG) 25 MG tablet Take 1 tablet (25 mg total) by mouth 2 (two) times daily.  . clotrimazole-betamethasone (LOTRISONE) cream Apply 1 application topically 2 (two) times daily.  . dapagliflozin propanediol (FARXIGA) 5 MG TABS tablet Take 1 tablet (5 mg total) by mouth daily before breakfast.  . diltiazem (CARDIZEM CD) 180 MG 24 hr capsule Take 1 capsule (180 mg total) by mouth daily.  . Fluticasone-Salmeterol (ADVAIR) 100-50 MCG/DOSE AEPB Inhale 1 puff into the lungs 2 (two) times daily.  . furosemide (LASIX) 40 MG tablet Take 0.5 tablets (20 mg total) by mouth 2 (two) times daily.  Marland Kitchen gabapentin (NEURONTIN) 600 MG tablet TAKE 2 TABLETS(1200 MG) BY MOUTH AT BEDTIME  . isosorbide mononitrate (IMDUR) 60 MG 24 hr tablet TAKE 1 TABLET(60 MG) BY MOUTH DAILY  . metFORMIN (GLUCOPHAGE-XR) 500 MG 24 hr tablet TAKE 2 TABLETS(1000 MG) BY MOUTH TWICE DAILY  . nitroGLYCERIN (NITROSTAT) 0.4 MG SL tablet Place 1 tablet (0.4 mg total) under the tongue every 5 (five) minutes as needed for chest pain.  . pantoprazole (PROTONIX) 40 MG tablet TAKE 1 TABLET(40 MG) BY MOUTH DAILY  . polyethylene glycol-electrolytes (NULYTELY) 420 g solution 8,000 mL for 2 doses  . ramipril (ALTACE) 10 MG capsule Take 1 capsule (10 mg total) by mouth daily.  Alveda Reasons 20 MG TABS tablet TAKE 1 TABLET(20 MG) BY  MOUTH DAILY WITH SUPPER     Allergies:   Doxycycline   Social History   Socioeconomic History  . Marital status: Widowed    Spouse name: Not on file  . Number of children: Not on file  . Years of education: Not on file  . Highest education level: 8th grade  Occupational History  . Occupation: part time   Tobacco Use  . Smoking status: Former Smoker    Packs/day: 4.00    Years: 28.00    Pack years: 112.00    Types: Cigarettes    Quit date: 08/25/1993    Years since quitting: 27.7  . Smokeless tobacco: Never Used  Vaping Use  . Vaping Use: Never used  Substance and Sexual Activity  . Alcohol use: Not Currently  . Drug use: No  . Sexual activity: Not Currently  Other Topics Concern  . Not on file  Social History Narrative   No regular exercise.   Social Determinants of Health   Financial Resource Strain: Low Risk   . Difficulty of Paying Living Expenses: Not hard at all  Food Insecurity: No Food Insecurity  . Worried About Charity fundraiser in the Last Year: Never true  . Ran Out of Food in the Last Year: Never true  Transportation Needs: No Transportation Needs  . Lack of Transportation (Medical): No  . Lack of Transportation (Non-Medical): No  Physical Activity: Inactive  . Days of Exercise per Week: 0 days  . Minutes of Exercise per Session: 0 min  Stress: No Stress Concern Present  . Feeling of Stress : Not at all  Social Connections: Not on file     Family History: The patient's family history includes Alcohol abuse in an other family member; Cancer in his brother; Coronary artery disease in an other family member; Depression in an other family member; Diabetes in his mother and another family member; Heart disease in his mother; Hyperlipidemia in his mother and another family member; Hypertension in his mother and another family member; Kidney Stones in his brother; Pneumonia in his father.  ROS:   Please see the history of present illness.     All other  systems reviewed and are negative.  EKGs/Labs/Other Studies Reviewed:    The following studies were reviewed today:  Echo 03/07/19 1. The left ventricle has normal systolic function, with an ejection  fraction of 55-60%. The cavity size was normal. There is moderately  increased left ventricular wall thickness. Left ventricular diastolic  function could not be evaluated secondary to  atrial fibrillation.  2. The right ventricle has normal systolic function. The cavity was  normal. There is no increase in right ventricular wall thickness.  3. Left atrial size was mild-moderately dilated.  4. The aortic valve was not well visualized Mild thickening of the aortic  valve. Mild to moderate aortic annular calcification noted.  5. The mitral valve was not well visualized. There is mild mitral annular  calcification present.  6. The interatrial septum was not well visualized.   Cardiac cath 03/07/19 Conclusion  Conclusions: 1. Severe single-vessel CAD with 80% mid LAD stenosis. 2. Non-obstructive LMCA, LCx, and RCA disease. 3. Upper normal left ventricular filling pressure with normal ejection fraction. 4. Mild to moderate mitral regurgitation in the setting of catheter-induced non-sustained ventricular tachycardia. 5. Successful PCI to mid LAD using Synergy 2.5 x 24 mm drug-eluting stent (post-dilated to 2.8 mm) with 0% residual stenosis and TIMI-3 flow.  Recommendations: 1. Overnight monitoring. 2. Obtain echo to better evaluate mitral regurgitation noted on left ventriculogram. 3. Restart heparin infusion 2 hours after TR band removal. 4. If no evidence of bleeding/vascular injury, recommend restarting rivaroxaban tomorrow and continuing rivaroxaban 20 mg daily and clopidogrel 75 mg daily x 12 months. 5. Aggressive secondary prevention.  Nelva Bush, MD [facility] Pager: [phone]  Coronary Diagrams   Diagnostic Dominance:  Right    Intervention       EKG:  EKG is  ordered today.  The ekg ordered today demonstrates Afib, 79bpm, nonspecific St/T wave abnormality, no significant change  Recent Labs: 03/17/2021: TSH 3.080 04/04/2021: ALT 15; BUN 17; Creatinine, Ser 1.19; Hemoglobin 10.6; Platelets 204; Potassium 4.0; Sodium 141  Recent Lipid Panel    Component Value Date/Time   CHOL 73 (L) 03/17/2021 1005   CHOL 195 08/01/2017 0902   CHOL 104 07/25/2014 0425   TRIG 170 (H)  03/17/2021 1005   TRIG 268 (H) 08/01/2017 0902   TRIG 243 (H) 07/25/2014 0425   TRIG 249 05/27/2010 0000   HDL 26 (L) 03/17/2021 1005   HDL 25 (L) 07/25/2014 0425   CHOLHDL 2.8 03/17/2021 1005   VLDL 54 (H) 08/01/2017 0902   VLDL 49 (H) 07/25/2014 0425   LDLCALC 19 03/17/2021 1005   LDLCALC 30 07/25/2014 0425    Physical Exam:    VS:  BP (!) 142/84   Pulse 79   Ht 5' 8.23" (1.733 m)   Wt 266 lb (120.7 kg)   BMI 40.17 kg/m     Wt Readings from Last 3 Encounters:  05/12/21 266 lb (120.7 kg)  05/11/21 266 lb 6.4 oz (120.8 kg)  05/04/21 269 lb 12.8 oz (122.4 kg)     GEN:  Well nourished, well developed in no acute distress HEENT: Normal NECK: No JVD; No carotid bruits LYMPHATICS: No lymphadenopathy CARDIAC: RRR, no murmurs, rubs, gallops RESPIRATORY:  Clear to auscultation without rales, wheezing or rhonchi  ABDOMEN: Soft, non-tender, non-distended MUSCULOSKELETAL:  No edema; No deformity  SKIN: Warm and dry NEUROLOGIC:  Alert and oriented x 3 PSYCHIATRIC:  Normal affect   ASSESSMENT:    1. Coronary artery disease involving native coronary artery of native heart with angina pectoris ([place])   2. Chronic diastolic heart failure ([place])   3. Essential hypertension   4. Anemia, unspecified type   5. Hyperlipidemia, mixed    PLAN:    In order of problems listed above:  CAD s/p DES to LAD 02/2019 with stable angina Patient reports stable angina which is unchanged. Breathing improved from the last visit,  suspect it was multifactorial given anemia and deconditioning  Echo showed normal EF with no WMA. Encouraged diet changes and exercise as tolerated. No further ischemic work-up at this time. Continue Aspirin, Imdur 60mg , coreg 25mg  BID, Atorvastatin 80mg  daily.   HFpEF Echo with preserved EF. He is euvolemic on exam. Breathing improved as above. Continue BB and ACE and current dose of lasix 20mg  BID.   HTN BP still mildly elevated today, however said he forgot to take coreg this AM. Reports BP at home also mildly elevated 140-150/70. Reported orthostatic episode when he stood up a couple days ago and SBP was in the 90s. Since then no further episodes. Orthostatics here are negative. He is on Imdur 60mg , coreg 25mg  BID, ramipril 10mg  daily, diltiazem 180mg  daily. Given recent orthostasis I will not increase antihypertensives. Patient reports he eats a lot of salt, recommend low salt diet. Also lifestyle changes discussed, would benefit from weight loss.   HLD Continue atorvastatin and Zetia. LDL 19 02/2021.  Permanent Afib He is in rate controlled Afib. Continue rate control with coreg. Continue Xarelto.   Iron deficiency anemia He is following with heme/onc for management.   Pre-operative evaluation for EGD/colonoscopy He is scheduled for EGD/colonoscopy next month. He is off Plavix, only taking Aspirin. Regarding ASA therapy, we recommend continuation of ASA throughout the perioperative period.  However, if the surgeon feels that cessation of ASA is required in the perioperative period, it may be stopped 5-7 days prior to surgery with a plan to resume it as soon as felt to be feasible from a surgical standpoint in the post-operative period. He is stable from a cardiac perspective to undergo procedures with no prior cardiac work-up. METS>4. According to Revised cardiac risk index he is class IV risk, 15% 30 day risk of death, MI or cardiac arrest.  Disposition: Follow up in 6 month(s) with MD     Signed, Brayam Boeke Ninfa Meeker, PA-C  05/12/2021 4:06 PM    [facility]

## 2021-05-12 NOTE — Telephone Encounter (Signed)
Blood thinner request has been faxed via epic and fax machine... awaiting response

## 2021-05-13 NOTE — Telephone Encounter (Signed)
Pre-operative evaluation for EGD/colonoscopy  He is scheduled for EGD/colonoscopy next month. He is off Plavix, only taking Aspirin. Regarding ASA therapy, we recommend continuation of ASA throughout the perioperative period.  However, if the surgeon feels that cessation of ASA is required in the perioperative period, it may be stopped 5-7 days prior to surgery with a plan to resume it as soon as felt to be feasible from a surgical standpoint in the post-operative period. He is stable from a cardiac perspective to undergo procedures with no prior cardiac work-up. METS>4. According to Revised cardiac risk index he is class IV risk, 15% 30 day risk of death, MI or cardiac arrest.       Cadence Ninfa Meeker, PA-C  05/12/2021 4:06 PM    [facility]

## 2021-05-18 ENCOUNTER — Telehealth: Payer: Self-pay | Admitting: Pharmacist

## 2021-05-18 NOTE — Chronic Care Management (AMB) (Addendum)
Chronic Care Management Pharmacy Assistant   Name: Ernest [name]  MRN: [id number] DOB: 13-Jul-1951   Reason for Encounter: Disease State COPD    Recent office visits:  05/04/2021 Ernest Cousins, MD (PCP) General follow up. Referral place for hemetology/oncology and Gastroenterology. Stop invokana and glipizide. Add farxiga or jardiance depending on labs. Labs ordered. Follow up in 4 weeks.  Recent consult visits:  05/12/2021 Ernest [name] (Cardiology) General follow up. EKG completed. Follow up in 6 months. 05/11/2021 Ernest Antigua, MD (Gastroenterology) Seen for Anemia. 04/11/2021 Ernest Evens, MD (Oncology) Follow up Iron deficiency Anemia. Start 5 doses of Venofer.  Hospital visits:  None in previous 6 months  Medications: Outpatient Encounter Medications as of 05/18/2021  Medication Sig   ACCU-CHEK GUIDE test strip    albuterol (VENTOLIN HFA) 108 (90 Base) MCG/ACT inhaler Inhale 2 puffs into the lungs every 4 (four) hours as needed for wheezing or shortness of breath. (Patient taking differently: Inhale 2 puffs into the lungs every 4 (four) hours as needed for wheezing or shortness of breath. Uses 2-3 times daily)   atorvastatin (LIPITOR) 80 MG tablet TAKE 1 TABLET(80 MG) BY MOUTH DAILY   carvedilol (COREG) 25 MG tablet Take 1 tablet (25 mg total) by mouth 2 (two) times daily.   clotrimazole-betamethasone (LOTRISONE) cream Apply 1 application topically 2 (two) times daily.   dapagliflozin propanediol (FARXIGA) 5 MG TABS tablet Take 1 tablet (5 mg total) by mouth daily before breakfast.   diltiazem (CARDIZEM CD) 180 MG 24 hr capsule Take 1 capsule (180 mg total) by mouth daily.   Fluticasone-Salmeterol (ADVAIR) 100-50 MCG/DOSE AEPB Inhale 1 puff into the lungs 2 (two) times daily.   furosemide (LASIX) 40 MG tablet Take 0.5 tablets (20 mg total) by mouth 2 (two) times daily.   gabapentin (NEURONTIN) 600 MG tablet TAKE 2 TABLETS(1200 MG) BY MOUTH AT BEDTIME   isosorbide  mononitrate (IMDUR) 60 MG 24 hr tablet TAKE 1 TABLET(60 MG) BY MOUTH DAILY   metFORMIN (GLUCOPHAGE-XR) 500 MG 24 hr tablet TAKE 2 TABLETS(1000 MG) BY MOUTH TWICE DAILY   nitroGLYCERIN (NITROSTAT) 0.4 MG SL tablet Place 1 tablet (0.4 mg total) under the tongue every 5 (five) minutes as needed for chest pain.   pantoprazole (PROTONIX) 40 MG tablet TAKE 1 TABLET(40 MG) BY MOUTH DAILY   polyethylene glycol-electrolytes (NULYTELY) 420 g solution 8,000 mL for 2 doses   ramipril (ALTACE) 10 MG capsule Take 1 capsule (10 mg total) by mouth daily.   XARELTO 20 MG TABS tablet TAKE 1 TABLET(20 MG) BY MOUTH DAILY WITH SUPPER   No facility-administered encounter medications on file as of 05/18/2021.   Current COPD regimen:           Albuterol 108 (90 Base) mcg 2 puffs ever four hours prn         Advair 100-60mcg Inhale 1 puff twice daily  No flowsheet data found. Any recent hospitalizations or ED visits since last visit with CPP? No   Reports COPD symptoms, including Wheezing due to the weather being warmer.  What recent interventions/DTPs have been made by any provider to improve breathing since last visit: None noted  Have you had exacerbation/flare-up since last visit? Yes   What do you do when you are short of breath?  Rescue medication  Respiratory Devices/Equipment Do you have a nebulizer? No Do you use a Peak Flow Meter? No Do you use a maintenance inhaler? Yes How often do you forget to use your  daily inhaler? 1 day out of the week. Do you use a rescue inhaler? Yes How often do you use your rescue inhaler?  daily Do you use a spacer with your inhaler? No  Adherence Review: Does the patient have >5 day gap between last estimated fill date for maintenance inhaler medications? No  Star Rating Drugs: Atorvastatin 80 mg Last filled:03/05/2021 90 DS Farxiga 5 mg Last filled:05/03/2021 30 DS Metformin 500 mg Last filled:04/02/2021 90 DS Ramipril 10 mg Last filled:03/11/2021 90  DS  [facility] Clinical Pharmacist Assistant [phone]

## 2021-05-20 ENCOUNTER — Encounter: Payer: Self-pay | Admitting: Gastroenterology

## 2021-05-23 ENCOUNTER — Ambulatory Visit: Payer: Medicare HMO | Admitting: Anesthesiology

## 2021-05-23 ENCOUNTER — Ambulatory Visit
Admission: RE | Admit: 2021-05-23 | Discharge: 2021-05-23 | Disposition: A | Discharge status: Home / Self Care | Payer: Medicare HMO | Attending: Gastroenterology | Admitting: Gastroenterology

## 2021-05-23 ENCOUNTER — Encounter: Payer: Self-pay | Admitting: Gastroenterology

## 2021-05-23 ENCOUNTER — Encounter
Admission: RE | Disposition: A | Discharge status: Home / Self Care | Payer: Self-pay | Source: Home / Self Care | Attending: Gastroenterology

## 2021-05-23 DIAGNOSIS — D500 Iron deficiency anemia secondary to blood loss (chronic): Secondary | ICD-10-CM

## 2021-05-23 DIAGNOSIS — E119 Type 2 diabetes mellitus without complications: Secondary | ICD-10-CM | POA: Diagnosis not present

## 2021-05-23 DIAGNOSIS — K639 Disease of intestine, unspecified: Secondary | ICD-10-CM | POA: Diagnosis not present

## 2021-05-23 DIAGNOSIS — Z881 Allergy status to other antibiotic agents status: Secondary | ICD-10-CM | POA: Insufficient documentation

## 2021-05-23 DIAGNOSIS — Z7951 Long term (current) use of inhaled steroids: Secondary | ICD-10-CM | POA: Diagnosis not present

## 2021-05-23 DIAGNOSIS — K31819 Angiodysplasia of stomach and duodenum without bleeding: Secondary | ICD-10-CM

## 2021-05-23 DIAGNOSIS — K5520 Angiodysplasia of colon without hemorrhage: Secondary | ICD-10-CM

## 2021-05-23 DIAGNOSIS — Z860100 Personal history of colon polyps, unspecified: Secondary | ICD-10-CM

## 2021-05-23 DIAGNOSIS — I4821 Permanent atrial fibrillation: Secondary | ICD-10-CM | POA: Diagnosis not present

## 2021-05-23 DIAGNOSIS — Z809 Family history of malignant neoplasm, unspecified: Secondary | ICD-10-CM | POA: Diagnosis not present

## 2021-05-23 DIAGNOSIS — Z1211 Encounter for screening for malignant neoplasm of colon: Secondary | ICD-10-CM | POA: Insufficient documentation

## 2021-05-23 DIAGNOSIS — Z7984 Long term (current) use of oral hypoglycemic drugs: Secondary | ICD-10-CM | POA: Diagnosis not present

## 2021-05-23 DIAGNOSIS — D125 Benign neoplasm of sigmoid colon: Secondary | ICD-10-CM | POA: Insufficient documentation

## 2021-05-23 DIAGNOSIS — K219 Gastro-esophageal reflux disease without esophagitis: Secondary | ICD-10-CM | POA: Insufficient documentation

## 2021-05-23 DIAGNOSIS — Z833 Family history of diabetes mellitus: Secondary | ICD-10-CM | POA: Insufficient documentation

## 2021-05-23 DIAGNOSIS — Z8249 Family history of ischemic heart disease and other diseases of the circulatory system: Secondary | ICD-10-CM | POA: Insufficient documentation

## 2021-05-23 DIAGNOSIS — J449 Chronic obstructive pulmonary disease, unspecified: Secondary | ICD-10-CM | POA: Diagnosis not present

## 2021-05-23 DIAGNOSIS — Z8349 Family history of other endocrine, nutritional and metabolic diseases: Secondary | ICD-10-CM | POA: Insufficient documentation

## 2021-05-23 DIAGNOSIS — Z87891 Personal history of nicotine dependence: Secondary | ICD-10-CM | POA: Insufficient documentation

## 2021-05-23 DIAGNOSIS — K635 Polyp of colon: Secondary | ICD-10-CM

## 2021-05-23 DIAGNOSIS — K317 Polyp of stomach and duodenum: Secondary | ICD-10-CM | POA: Diagnosis not present

## 2021-05-23 DIAGNOSIS — K6289 Other specified diseases of anus and rectum: Secondary | ICD-10-CM | POA: Insufficient documentation

## 2021-05-23 DIAGNOSIS — J8410 Pulmonary fibrosis, unspecified: Secondary | ICD-10-CM | POA: Diagnosis not present

## 2021-05-23 DIAGNOSIS — Z7902 Long term (current) use of antithrombotics/antiplatelets: Secondary | ICD-10-CM | POA: Diagnosis not present

## 2021-05-23 DIAGNOSIS — D131 Benign neoplasm of stomach: Secondary | ICD-10-CM | POA: Diagnosis not present

## 2021-05-23 DIAGNOSIS — Z79899 Other long term (current) drug therapy: Secondary | ICD-10-CM | POA: Diagnosis not present

## 2021-05-23 DIAGNOSIS — D123 Benign neoplasm of transverse colon: Secondary | ICD-10-CM | POA: Insufficient documentation

## 2021-05-23 DIAGNOSIS — D509 Iron deficiency anemia, unspecified: Secondary | ICD-10-CM | POA: Insufficient documentation

## 2021-05-23 DIAGNOSIS — K648 Other hemorrhoids: Secondary | ICD-10-CM | POA: Insufficient documentation

## 2021-05-23 HISTORY — PX: COLONOSCOPY WITH PROPOFOL: SHX5780

## 2021-05-23 HISTORY — PX: ESOPHAGOGASTRODUODENOSCOPY (EGD) WITH PROPOFOL: SHX5813

## 2021-05-23 HISTORY — DX: Acute myocardial infarction, unspecified: I21.9

## 2021-05-23 HISTORY — DX: Unspecified atrial fibrillation: I48.91

## 2021-05-23 LAB — GLUCOSE, CAPILLARY: Glucose-Capillary: 114 mg/dL — ABNORMAL HIGH (ref 70–99)

## 2021-05-23 SURGERY — COLONOSCOPY WITH PROPOFOL
Anesthesia: General

## 2021-05-23 MED ORDER — PROPOFOL 500 MG/50ML IV EMUL
INTRAVENOUS | Status: AC
  Filled 2021-05-23: qty 50

## 2021-05-23 MED ORDER — PROPOFOL 10 MG/ML IV BOLUS
INTRAVENOUS | Status: DC | PRN
  Administered 2021-05-23: 60 mg via INTRAVENOUS

## 2021-05-23 MED ORDER — SODIUM CHLORIDE 0.9 % IV SOLN: INTRAVENOUS | Status: DC

## 2021-05-23 MED ORDER — IPRATROPIUM-ALBUTEROL 0.5-2.5 (3) MG/3ML IN SOLN
3.0000 mL | Freq: Once | RESPIRATORY_TRACT | Status: AC
  Administered 2021-05-23: 3 mL via RESPIRATORY_TRACT

## 2021-05-23 MED ORDER — LIDOCAINE HCL (CARDIAC) PF 100 MG/5ML IV SOSY
PREFILLED_SYRINGE | INTRAVENOUS | Status: DC | PRN
  Administered 2021-05-23: 100 mg via INTRAVENOUS

## 2021-05-23 MED ORDER — PROPOFOL 500 MG/50ML IV EMUL
INTRAVENOUS | Status: DC | PRN
  Administered 2021-05-23: 150 ug/kg/min via INTRAVENOUS

## 2021-05-23 MED ORDER — IPRATROPIUM-ALBUTEROL 0.5-2.5 (3) MG/3ML IN SOLN
RESPIRATORY_TRACT | Status: AC
  Filled 2021-05-23: qty 3

## 2021-05-23 NOTE — Op Note (Signed)
[facility] Gastroenterology Patient Name: Ernest [name] Procedure Date: 05/23/2021 9:23 AM MRN: [id number] Account #: [id number] Date of Birth: 26-Oct-1951 Admit Type: Outpatient Age: 70 Room: [facility] ENDO ROOM 2 Gender: Male Note Status: Finalized Procedure:             Small bowel enteroscopy Indications:           Iron deficiency anemia, Angioectasia, Follow-up of                         angioectasia Providers:             Bernetta Sutley B. Bonna Gains MD, MD Referring MD:          [name] (Referring MD) Medicines:             General Anesthesia Procedure:             Pre-Anesthesia Assessment:                        - Prior to the procedure, a History and Physical was                         performed, and patient medications and allergies were                         reviewed. The patient is competent. The risks and                         benefits of the procedure and the sedation options and                         risks were discussed with the patient. All questions                         were answered and informed consent was obtained.                         Patient identification and proposed procedure were                         verified by the physician, the nurse, the                         anesthesiologist, the anesthetist and the technician                         in the pre-procedure area in the procedure room in the                         endoscopy suite. Mental Status Examination: alert and                         oriented. Airway Examination: normal oropharyngeal                         airway and neck mobility. Respiratory Examination:                         clear to auscultation. CV Examination: normal.  Prophylactic Antibiotics: The patient does not require                         prophylactic antibiotics. Prior Anticoagulants: The                         patient has taken Xarelto (rivaroxaban), last dose was                          5 days prior to procedure. ASA Grade Assessment: III -                         A patient with severe systemic disease. After                         reviewing the risks and benefits, the patient was                         deemed in satisfactory condition to undergo the                         procedure. The anesthesia plan was to use general                         anesthesia. Immediately prior to administration of                         medications, the patient was re-assessed for adequacy                         to receive sedatives. The heart rate, respiratory                         rate, oxygen saturations, blood pressure, adequacy of                         pulmonary ventilation, and response to care were                         monitored throughout the procedure. The physical                         status of the patient was re-assessed after the                         procedure.                        After obtaining informed consent, the endoscope was                         passed under direct vision. Throughout the procedure,                         the patient's blood pressure, pulse, and oxygen                         saturations were monitored continuously. The  Colonoscope was introduced through the mouth, and                         advanced to the jejunum, to the 160 cm mark (from the                         incisors). The small bowel enteroscopy was                         accomplished with ease. The patient tolerated the                         procedure well. Findings:      The examined esophagus was normal.      Three small angioectasias with one of them had contact bleeding, were       found at the incisura and in the gastric antrum. Coagulation for       bleeding prevention using argon plasma was successful.      Multiple 3 to 5 mm sessile polyps with no bleeding and no stigmata of       recent bleeding were found in the gastric  fundus and in the gastric       body. Biopsies were taken with a cold forceps for histology. 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      The exam of the stomach was otherwise normal.      There was no evidence of significant pathology in the entire examined       duodenum.      There was no evidence of significant pathology in the entire examined       portion of jejunum. Impression:            - Normal esophagus.                        - Three one of them had contact bleeding,                         angioectasias in the stomach. Treated with argon                         plasma coagulation (APC).                        - Multiple gastric polyps. Biopsied.                        - Normal examined duodenum.                        - The examined portion of the jejunum was normal. Recommendation:        - Continue present medications.                        - Resume previous diet.                        - Return to my office as previously scheduled.                        -  Return to primary care physician as previously                         scheduled.                        - The findings and recommendations were discussed with                         the patient.                        - The findings and recommendations were discussed with                         the patient's family. Procedure Code(s):     --- Professional ---                        [phone], 55, Small intestinal endoscopy, enteroscopy                         beyond second portion of duodenum, not including                         ileum; with control of bleeding (eg, injection,                         bipolar cautery, unipolar cautery, laser, heater                         probe, stapler, plasma coagulator)                        44361, Small intestinal endoscopy, enteroscopy beyond                         second portion of duodenum, not including ileum; with                          biopsy, single or multiple Diagnosis Code(s):     --- Professional ---                        K31.7, Polyp of stomach and duodenum                        D50.9, Iron deficiency anemia, unspecified                        K55.20, Angiodysplasia of colon without hemorrhage CPT copyright 2019 American Medical Association. All rights reserved. The codes documented in this report are preliminary and upon coder review may  be revised to meet current compliance requirements.  [name], MD Margretta Sidle B. Bonna Gains MD, MD 05/23/2021 10:04:29 AM This report has been signed electronically. Number of Addenda: 0 Note Initiated On: 05/23/2021 9:23 AM Estimated Blood Loss:  Estimated blood loss: none.      [facility]

## 2021-05-23 NOTE — Anesthesia Preprocedure Evaluation (Signed)
Anesthesia Evaluation  Patient identified by MRN, date of birth, ID band Patient awake    Reviewed: Allergy & Precautions, H&P , NPO status , Patient's Chart, lab work & pertinent test results, reviewed documented beta blocker date and time   History of Anesthesia Complications Negative for: history of anesthetic complications  Airway Mallampati: III  TM Distance: <3 FB Neck ROM: limited    Dental  (+) Dental Advidsory Given, Upper Dentures, Missing   Pulmonary neg pulmonary ROS, asthma , sleep apnea , COPD, former smoker,     + decreased breath sounds      Cardiovascular Exercise Tolerance: Good hypertension, (-) angina+ CAD (non-obstructive) and + Past MI  (-) dysrhythmias  Rhythm:irregular Rate:Normal     Neuro/Psych  Neuromuscular disease negative psych ROS   GI/Hepatic negative GI ROS, Neg liver ROS, GERD  ,  Endo/Other  negative endocrine ROSdiabetes, Type 2  Renal/GU CRFRenal diseasenegative Renal ROS  negative genitourinary   Musculoskeletal   Abdominal   Peds  Hematology  (+) Blood dyscrasia, anemia ,   Anesthesia Other Findings Past Medical History: No date: Asthma No date: COPD (chronic obstructive pulmonary disease) (HCC) No date: Diabetes mellitus     Comment:  Type II No date: Early Pulmonary fibrosis (HCC) No date: GERD (gastroesophageal reflux disease) No date: History of echocardiogram     Comment:  a. 02/2017 Echo: EF 60-65%, no rwma, mild MR, mod dil LA.              Nl RV fxn. PASP 63mmHg. No date: Hyperlipidemia No date: Hypertension No date: Morbid obesity ([place]) No date: Non-obstructive CAD (coronary artery disease)     Comment:  a. 2011 Cath: nonobs dzs; b. 07/2014 Cath: LM 30, LAD nl,              LCX nl, RCA 20p, 31m. No date: Permanent atrial fibrillation (HCC)     Comment:  a. CHA2DS2VASc = 4-->xarelto.  Past Surgical History: No date: ANKLE SURGERY 05/2010: CARDIAC  CATHETERIZATION     Comment:  [facility] No date: CARDIAC CATHETERIZATION     Comment:  [facility] No date: CARDIAC CATHETERIZATION     Comment:  [facility] 07/2014: CARDIAC CATHETERIZATION     Comment:  [facility] No date: COLONOSCOPY 70's: TESTICLE SURGERY No date: TUMOR EXCISION     Comment:  Neck and finger; benign No date: WRIST SURGERY  BMI    Body Mass Index:  38.88 kg/m     Reproductive/Obstetrics negative OB ROS                             Anesthesia Physical  Anesthesia Plan  ASA: III  Anesthesia Plan: General   Post-op Pain Management:    Induction: Intravenous  PONV Risk Score and Plan: TIVA and Propofol infusion  Airway Management Planned: Natural Airway and Nasal Cannula  Additional Equipment:   Intra-op Plan:   Post-operative Plan:   Informed Consent: I have reviewed the patients History and Physical, chart, labs and discussed the procedure including the risks, benefits and alternatives for the proposed anesthesia with the patient or authorized representative who has indicated his/her understanding and acceptance.     Dental Advisory Given  Plan Discussed with: Anesthesiologist, CRNA and Surgeon  Anesthesia Plan Comments: (Patient consented for risks of anesthesia including but not limited to:  - adverse reactions to medications - risk of airway placement if required - damage to eyes, teeth, lips or other  oral mucosa - nerve damage due to positioning  - sore throat or hoarseness - Damage to heart, brain, nerves, lungs, other parts of body or loss of life  Patient voiced understanding.)        Anesthesia Quick Evaluation

## 2021-05-23 NOTE — Op Note (Signed)
[facility] Gastroenterology Patient Name: Ernest [name] Procedure Date: 05/23/2021 9:23 AM MRN: [id number] Account #: [id number] Date of Birth: [birth year] Admit Type: Outpatient Age: 70 Room: [facility] ENDO ROOM 2 Gender: Male Note Status: Finalized Procedure:             Colonoscopy Indications:           Screening for colorectal malignant neoplasm Providers:             Bianco Cange B. Bonna Gains MD, MD Referring MD:          [name] (Referring MD) Medicines:             Monitored Anesthesia Care Complications:         No immediate complications. Procedure:             Pre-Anesthesia Assessment:                        - ASA Grade Assessment: II - A patient with mild                         systemic disease.                        - Prior to the procedure, a History and Physical was                         performed, and patient medications, allergies and                         sensitivities were reviewed. The patient's tolerance                         of previous anesthesia was reviewed.                        - The risks and benefits of the procedure and the                         sedation options and risks were discussed with the                         patient. All questions were answered and informed                         consent was obtained.                        - Patient identification and proposed procedure were                         verified prior to the procedure by the physician, the                         nurse, the anesthesiologist, the anesthetist and the                         technician. The procedure was verified in the                         procedure room.  After obtaining informed consent, the colonoscope was                         passed under direct vision. Throughout the procedure,                         the patient's blood pressure, pulse, and oxygen                         saturations were monitored  continuously. The                         Colonoscope was introduced through the anus and                         advanced to the the cecum, identified by appendiceal                         orifice and ileocecal valve. The colonoscopy was                         performed with ease. The patient tolerated the                         procedure well. The quality of the bowel preparation                         was fair. Findings:      The perianal and digital rectal examinations were normal.      Multiple angioectasias with bleeding on contact were found in the       ascending colon and in the cecum. Coagulation for bleeding prevention       using argon plasma was successful.      Two sessile polyps were found in the transverse colon. The polyps were 4       to 8 mm in size. These polyps were removed with a cold snare. Resection       and retrieval were complete.      A 4 mm polyp was found in the sigmoid colon. The polyp was flat. The       polyp was removed with a cold snare. Resection was complete, but the       polyp tissue was not retrieved.      An area of thickened folds of the mucosa was found at 55 cm proximal to       the anus. Biopsies were taken with a cold forceps for histology.      The exam was otherwise without abnormality.      Non-bleeding left-sided colonic varices were found.      Non-bleeding internal hemorrhoids were found during retroflexion.      No additional abnormalities were found on retroflexion. Impression:            - Preparation of the colon was fair.                        - Multiple colonic angioectasias. Treated with argon                         plasma coagulation (APC).                        -  Two 4 to 8 mm polyps in the transverse colon,                         removed with a cold snare. Resected and retrieved.                        - One 4 mm polyp in the sigmoid colon, removed with a                         cold snare. Complete resection. Polyp  tissue not                         retrieved.                        - Thickened folds of the mucosa at 55 cm proximal to                         the anus. Biopsied.                        - The examination was otherwise normal.                        - Left-sided colonic varices.                        - Non-bleeding internal hemorrhoids. Recommendation:        - Discharge patient to home (with escort).                        - Return to clinic in 3-4 weeks to discuss further                         evaluation and workup for sigmoid varices.                        - Advance diet as tolerated.                        - Continue present medications.                        - Await pathology results.                        - Repeat colonoscopy date to be determined after                         pending pathology results are reviewed.                        - The findings and recommendations were discussed with                         the patient.                        - The findings and recommendations were discussed with  the patient's family.                        - Return to primary care physician as previously                         scheduled. Procedure Code(s):     --- Professional ---                        [id number], 59, Colonoscopy, flexible; with control of                         bleeding, any method                        45385, Colonoscopy, flexible; with removal of                         tumor(s), polyp(s), or other lesion(s) by snare                         technique                        45380, 58, Colonoscopy, flexible; with biopsy, single                         or multiple Diagnosis Code(s):     --- Professional ---                        K63.5, Polyp of colon                        Z12.11, Encounter for screening for malignant neoplasm                         of colon                        K55.20, Angiodysplasia of colon without hemorrhage                         K63.89, Other specified diseases of intestine CPT copyright 2019 American Medical Association. All rights reserved. The codes documented in this report are preliminary and upon coder review may  be revised to meet current compliance requirements.  [name], MD Margretta Sidle B. Bonna Gains MD, MD 05/23/2021 11:06:51 AM This report has been signed electronically. Number of Addenda: 0 Note Initiated On: 05/23/2021 9:23 AM Scope Withdrawal Time: 0 hours 32 minutes 56 seconds  Total Procedure Duration: 0 hours 38 minutes 57 seconds  Estimated Blood Loss:  Estimated blood loss: none.      [facility]

## 2021-05-23 NOTE — Anesthesia Postprocedure Evaluation (Signed)
Anesthesia Post Note  Patient: Ernest [name]  Procedure(s) Performed: COLONOSCOPY WITH PROPOFOL (N/A ) PUSH ESOPHAGOGASTRODUODENOSCOPY (EGD) WITH PROPOFOL (N/A )  Patient location during evaluation: Endoscopy Anesthesia Type: General Level of consciousness: awake and alert Pain management: pain level controlled Vital Signs Assessment: post-procedure vital signs reviewed and stable Respiratory status: spontaneous breathing, nonlabored ventilation, respiratory function stable and patient connected to nasal cannula oxygen Cardiovascular status: blood pressure returned to baseline and stable Postop Assessment: no apparent nausea or vomiting Anesthetic complications: no   No complications documented.   Last Vitals:  Vitals:   05/23/21 1120 05/23/21 1130  BP: 130/84 (!) 158/94  Pulse: 87 85  Resp: 17 18  Temp:    SpO2: (!) 89% 90%    Last Pain:  Vitals:   05/23/21 1130  TempSrc:   PainSc: 0-No pain                 Precious Haws Remi Rester

## 2021-05-23 NOTE — Transfer of Care (Signed)
Immediate Anesthesia Transfer of Care Note  Patient: Ernest [name]  Procedure(s) Performed: COLONOSCOPY WITH PROPOFOL (N/A ) PUSH ESOPHAGOGASTRODUODENOSCOPY (EGD) WITH PROPOFOL (N/A )  Patient Location: Endoscopy Unit  Anesthesia Type:General  Level of Consciousness: drowsy  Airway & Oxygen Therapy: Patient Spontanous Breathing  Post-op Assessment: Report given to RN and Post -op Vital signs reviewed and stable  Post vital signs: Reviewed and stable  Last Vitals:  Vitals Value Taken Time  BP 113/47 05/23/21 1050  Temp    Pulse 90 05/23/21 1050  Resp 15 05/23/21 1050  SpO2 89 % 05/23/21 1050  Vitals shown include unvalidated device data.  Last Pain:  Vitals:   05/23/21 0900  TempSrc: Tympanic  PainSc: 0-No pain         Complications: No complications documented.

## 2021-05-23 NOTE — Anesthesia Procedure Notes (Signed)
Date/Time: 05/23/2021 9:29 AM Performed by: Lily Peer, Terrie Haring, CRNA Pre-anesthesia Checklist: Patient identified, Emergency Drugs available, Suction available, Patient being monitored and Timeout performed Patient Re-evaluated:Patient Re-evaluated prior to induction Oxygen Delivery Method: Simple face mask Induction Type: IV induction

## 2021-05-23 NOTE — H&P (Signed)
[name], MD [address], [place], [place], [name], [address] [address], [place], [place], [name], [address] Phone: [phone]  Fax: [phone]  Primary Care Physician:  [name], MD   Pre-Procedure History & Physical: HPI:  Ernest [name] is a 70 y.o. male is here for a colonoscopy and push enteroscopy.   Past Medical History:  Diagnosis Date  . Anemia   . Asthma   . COPD (chronic obstructive pulmonary disease) ([place])   . Diabetes mellitus    Type II  . Early Pulmonary fibrosis ([place])   . GERD (gastroesophageal reflux disease)   . History of echocardiogram    a. 02/2017 Echo: EF 60-65%, no rwma, mild MR, mod dil LA. Nl RV fxn. PASP 89mmHg.  Marland Kitchen Hyperlipidemia   . Hypertension   . Morbid obesity ([place])   . Non-obstructive CAD (coronary artery disease)    a. 2011 Cath: nonobs dzs; b. 07/2014 Cath: LM 30, LAD nl, LCX nl, RCA 20p, 57m.  Marland Kitchen Permanent atrial fibrillation (HCC)    a. CHA2DS2VASc = 4-->xarelto.  . Polyp of sigmoid colon     Past Surgical History:  Procedure Laterality Date  . ANKLE SURGERY    . CARDIAC CATHETERIZATION  05/2010   [facility]  . CARDIAC CATHETERIZATION     [facility]  . CARDIAC CATHETERIZATION     [facility]  . CARDIAC CATHETERIZATION  07/2014   [facility]  . COLONOSCOPY    . COLONOSCOPY WITH PROPOFOL N/A 06/26/2018   Procedure: COLONOSCOPY WITH PROPOFOL;  Surgeon: Virgel Manifold, MD;  Location: [facility]COPY;  Service: Endoscopy;  Laterality: N/A;  . CORONARY ARTERY BYPASS GRAFT    . CORONARY STENT INTERVENTION N/A 03/07/2019   Procedure: CORONARY STENT INTERVENTION;  Surgeon: Nelva Bush, MD;  Location: [facility];  Service: Cardiovascular;  Laterality: N/A;  . ENTEROSCOPY N/A 08/05/2018   Procedure: ENTEROSCOPY;  Surgeon: Virgel Manifold, MD;  Location: [facility];  Service: Endoscopy;  Laterality: N/A;  . ESOPHAGOGASTRODUODENOSCOPY (EGD) WITH PROPOFOL N/A 06/26/2018   Procedure: ESOPHAGOGASTRODUODENOSCOPY (EGD)  WITH PROPOFOL;  Surgeon: Virgel Manifold, MD;  Location: [facility];  Service: Endoscopy;  Laterality: N/A;  . LEFT HEART CATH AND CORONARY ANGIOGRAPHY N/A 03/07/2019   Procedure: LEFT HEART CATH AND CORONARY ANGIOGRAPHY;  Surgeon: Nelva Bush, MD;  Location: [facility];  Service: Cardiovascular;  Laterality: N/A;  . TESTICLE SURGERY  70's  . TUMOR EXCISION     Neck and finger; benign  . WRIST SURGERY      Prior to Admission medications   Medication Sig Start Date End Date Taking? Authorizing Provider  ACCU-CHEK GUIDE test strip  07/08/20   [provider]  albuterol (VENTOLIN HFA) 108 (90 Base) MCG/ACT inhaler Inhale 2 puffs into the lungs every 4 (four) hours as needed for wheezing or shortness of breath. Patient taking differently: Inhale 2 puffs into the lungs every 4 (four) hours as needed for wheezing or shortness of breath. Uses 2-3 times daily 03/11/21   [name], [name], MD  atorvastatin (LIPITOR) 80 MG tablet TAKE 1 TABLET(80 MG) BY MOUTH DAILY 03/11/21   [name], [name], MD  carvedilol (COREG) 25 MG tablet Take 1 tablet (25 mg total) by mouth 2 (two) times daily. 04/04/21 07/03/21  Furth, Cadence H, PA-C  clotrimazole-betamethasone (LOTRISONE) cream Apply 1 application topically 2 (two) times daily. 03/11/21   [name], [name], MD  dapagliflozin propanediol (FARXIGA) 5 MG TABS tablet Take 1 tablet (5 mg total) by mouth daily before  breakfast. 03/24/21   [name], [name], MD  diltiazem (CARDIZEM CD) 180 MG 24 hr capsule Take 1 capsule (180 mg total) by mouth daily. 03/11/21 06/09/21  [name], [name], MD  Fluticasone-Salmeterol (ADVAIR) 100-50 MCG/DOSE AEPB Inhale 1 puff into the lungs 2 (two) times daily. 04/08/21   [name], [name], MD  furosemide (LASIX) 40 MG tablet Take 0.5 tablets (20 mg total) by mouth 2 (two) times daily. 01/12/20   Johnson, Megan P, DO  gabapentin (NEURONTIN) 600 MG tablet TAKE 2 TABLETS(1200 MG) BY MOUTH AT BEDTIME 03/11/21   [name], [name], MD  isosorbide  mononitrate (IMDUR) 60 MG 24 hr tablet TAKE 1 TABLET(60 MG) BY MOUTH DAILY 03/11/21   [name], [name], MD  metFORMIN (GLUCOPHAGE-XR) 500 MG 24 hr tablet TAKE 2 TABLETS(1000 MG) BY MOUTH TWICE DAILY 03/11/21   [name], [name], MD  nitroGLYCERIN (NITROSTAT) 0.4 MG SL tablet Place 1 tablet (0.4 mg total) under the tongue every 5 (five) minutes as needed for chest pain. 03/18/21   Furth, Cadence H, PA-C  pantoprazole (PROTONIX) 40 MG tablet TAKE 1 TABLET(40 MG) BY MOUTH DAILY 03/11/21   [name], [name], MD  polyethylene glycol-electrolytes (NULYTELY) 420 g solution 8,000 mL for 2 doses 05/11/21   [name] B, MD  ramipril (ALTACE) 10 MG capsule Take 1 capsule (10 mg total) by mouth daily. 03/11/21   [name], [name], MD  XARELTO 20 MG TABS tablet TAKE 1 TABLET(20 MG) BY MOUTH DAILY WITH SUPPER 05/08/21   [name], MD    Allergies as of 05/12/2021 - Review Complete 05/12/2021  Allergen Reaction Noted  . Doxycycline Other (See Comments) 02/03/2019    Family History  Problem Relation Age of Onset  . Hypertension Mother   . Hyperlipidemia Mother   . Diabetes Mother   . Heart disease Mother        CABG  . Pneumonia Father        rare type  . Diabetes Other   . Depression Other   . Coronary artery disease Other   . Alcohol abuse Other   . Hypertension Other   . Hyperlipidemia Other   . Cancer Brother   . Kidney Stones Brother     Social History   Socioeconomic History  . Marital status: Widowed    Spouse name: Not on file  . Number of children: Not on file  . Years of education: Not on file  . Highest education level: 8th grade  Occupational History  . Occupation: part time   Tobacco Use  . Smoking status: Former Smoker    Packs/day: 4.00    Years: 28.00    Pack years: 112.00    Types: Cigarettes    Quit date: 08/25/1993    Years since quitting: 27.7  . Smokeless tobacco: Never Used  Vaping Use  . Vaping Use: Never used  Substance and Sexual Activity  . Alcohol use: Not Currently   . Drug use: No  . Sexual activity: Not Currently  Other Topics Concern  . Not on file  Social History Narrative   No regular exercise.   Social Determinants of Health   Financial Resource Strain: Low Risk   . Difficulty of Paying Living Expenses: Not hard at all  Food Insecurity: No Food Insecurity  . Worried About Charity fundraiser in the Last Year: Never true  . Ran Out of Food in the Last Year: Never true  Transportation Needs: No Transportation Needs  . Lack of Transportation (Medical): No  . Lack of  Transportation (Non-Medical): No  Physical Activity: Inactive  . Days of Exercise per Week: 0 days  . Minutes of Exercise per Session: 0 min  Stress: No Stress Concern Present  . Feeling of Stress : Not at all  Social Connections: Not on file  Intimate Partner Violence: Not on file    Review of Systems: See HPI, otherwise negative ROS  Physical Exam: There were no vitals taken for this visit. General:   Alert,  pleasant and cooperative in NAD Head:  Normocephalic and atraumatic. Neck:  Supple; no masses or thyromegaly. Lungs:  Clear throughout to auscultation, normal respiratory effort.    Heart:  +S1, +S2, Regular rate and rhythm, No edema. Abdomen:  Soft, nontender and nondistended. Normal bowel sounds, without guarding, and without rebound.   Neurologic:  Alert and  oriented x4;  grossly normal neurologically.  Impression/Plan: Ernest [name] is here for a colonoscopy to be performed for history of polyps and push enteroscopy for iron deficiency anemia, history of AVMs.  Risks, benefits, limitations, and alternatives regarding the procedures have been reviewed with the patient.  Questions have been answered.  All parties agreeable.   Virgel Manifold, MD  05/23/2021, 8:46 AM

## 2021-05-24 ENCOUNTER — Encounter: Payer: Self-pay | Admitting: Gastroenterology

## 2021-05-24 LAB — SURGICAL PATHOLOGY

## 2021-05-25 ENCOUNTER — Telehealth: Payer: Self-pay | Admitting: Pharmacist

## 2021-05-25 NOTE — Telephone Encounter (Signed)
Faxed application and year to date out of pocket to [employer]. Will ask CPA to follow up in 1-2 weeks.

## 2021-05-26 ENCOUNTER — Encounter: Payer: Self-pay | Admitting: Gastroenterology

## 2021-05-31 ENCOUNTER — Other Ambulatory Visit: Payer: Medicare HMO

## 2021-05-31 ENCOUNTER — Other Ambulatory Visit: Payer: Self-pay

## 2021-05-31 DIAGNOSIS — E782 Mixed hyperlipidemia: Secondary | ICD-10-CM | POA: Diagnosis not present

## 2021-05-31 DIAGNOSIS — N400 Enlarged prostate without lower urinary tract symptoms: Secondary | ICD-10-CM | POA: Diagnosis not present

## 2021-05-31 DIAGNOSIS — E1159 Type 2 diabetes mellitus with other circulatory complications: Secondary | ICD-10-CM | POA: Diagnosis not present

## 2021-05-31 DIAGNOSIS — R7989 Other specified abnormal findings of blood chemistry: Secondary | ICD-10-CM

## 2021-05-31 DIAGNOSIS — I152 Hypertension secondary to endocrine disorders: Secondary | ICD-10-CM | POA: Diagnosis not present

## 2021-06-01 LAB — CBC WITH DIFFERENTIAL/PLATELET
Basophils Absolute: 0.1 10*3/uL (ref 0.0–0.2)
Basos: 1 %
EOS (ABSOLUTE): 0.2 10*3/uL (ref 0.0–0.4)
Eos: 2 %
Hematocrit: 39.1 % (ref 37.5–51.0)
Hemoglobin: 12.2 g/dL — ABNORMAL LOW (ref 13.0–17.7)
Immature Grans (Abs): 0 10*3/uL (ref 0.0–0.1)
Immature Granulocytes: 0 %
Lymphocytes Absolute: 2.6 10*3/uL (ref 0.7–3.1)
Lymphs: 35 %
MCH: 25.8 pg — ABNORMAL LOW (ref 26.6–33.0)
MCHC: 31.2 g/dL — ABNORMAL LOW (ref 31.5–35.7)
MCV: 83 fL (ref 79–97)
Monocytes Absolute: 0.4 10*3/uL (ref 0.1–0.9)
Monocytes: 6 %
Neutrophils Absolute: 4.1 10*3/uL (ref 1.4–7.0)
Neutrophils: 56 %
Platelets: 211 10*3/uL (ref 150–450)
RBC: 4.72 x10E6/uL (ref 4.14–5.80)
RDW: 17 % — ABNORMAL HIGH (ref 11.6–15.4)
WBC: 7.3 10*3/uL (ref 3.4–10.8)

## 2021-06-01 LAB — THYROID PANEL WITH TSH
Free Thyroxine Index: 2.7 (ref 1.2–4.9)
T3 Uptake Ratio: 31 % (ref 24–39)
T4, Total: 8.7 ug/dL (ref 4.5–12.0)
TSH: 2.29 u[IU]/mL (ref 0.450–4.500)

## 2021-06-01 LAB — COMPREHENSIVE METABOLIC PANEL
ALT: 9 IU/L (ref 0–44)
AST: 13 IU/L (ref 0–40)
Albumin/Globulin Ratio: 2.4 — ABNORMAL HIGH (ref 1.2–2.2)
Albumin: 4.5 g/dL (ref 3.7–4.7)
Alkaline Phosphatase: 91 IU/L (ref 44–121)
BUN/Creatinine Ratio: 11 (ref 10–24)
BUN: 12 mg/dL (ref 8–27)
Bilirubin Total: 1.4 mg/dL — ABNORMAL HIGH (ref 0.0–1.2)
CO2: 23 mmol/L (ref 20–29)
Calcium: 9.7 mg/dL (ref 8.6–10.2)
Chloride: 102 mmol/L (ref 96–106)
Creatinine, Ser: 1.14 mg/dL (ref 0.76–1.27)
Globulin, Total: 1.9 g/dL (ref 1.5–4.5)
Glucose: 113 mg/dL — ABNORMAL HIGH (ref 65–99)
Potassium: 4.4 mmol/L (ref 3.5–5.2)
Sodium: 143 mmol/L (ref 134–144)
Total Protein: 6.4 g/dL (ref 6.0–8.5)
eGFR: 69 mL/min/{1.73_m2} (ref >59)

## 2021-06-01 LAB — LIPID PANEL
Chol/HDL Ratio: 2.8 ratio (ref 0.0–5.0)
Cholesterol, Total: 70 mg/dL — ABNORMAL LOW (ref 100–199)
HDL: 25 mg/dL — ABNORMAL LOW (ref >39)
LDL Chol Calc (NIH): 20 mg/dL (ref 0–99)
Triglycerides: 144 mg/dL (ref 0–149)
VLDL Cholesterol Cal: 25 mg/dL (ref 5–40)

## 2021-06-01 LAB — MICROALBUMIN, URINE WAIVED
Creatinine, Urine Waived: 200 mg/dL (ref 10–300)
Microalb, Ur Waived: 80 mg/L — ABNORMAL HIGH (ref 0–19)

## 2021-06-01 LAB — PSA TOTAL+% FREE (SERIAL)
PSA, Free Pct: 27.5 %
PSA, Free: 0.22 ng/mL
Prostate Specific Ag, Serum: 0.8 ng/mL (ref 0.0–4.0)

## 2021-06-01 LAB — BAYER DCA HB A1C WAIVED: HB A1C (BAYER DCA - WAIVED): 6 % (ref <7.0)

## 2021-06-06 ENCOUNTER — Other Ambulatory Visit: Payer: Self-pay | Admitting: Internal Medicine

## 2021-06-06 DIAGNOSIS — I10 Essential (primary) hypertension: Secondary | ICD-10-CM

## 2021-06-06 NOTE — Telephone Encounter (Signed)
Scheduled tomorrow 6/21

## 2021-06-06 NOTE — Telephone Encounter (Signed)
  Notes to clinic:  Patient requests 90 days supply  Requested Prescriptions  Pending Prescriptions Disp Refills   isosorbide mononitrate (IMDUR) 60 MG 24 hr tablet [Pharmacy Med Name: ISOSORBIDE MONONITRATE 60MG  ER TABS] 90 tablet     Sig: TAKE 1 TABLET(60 MG) BY MOUTH DAILY      Cardiovascular:  Nitrates Failed - 06/06/2021  7:38 AM      Failed - Last BP in normal range    BP Readings from Last 1 Encounters:  05/23/21 (!) 158/94          Passed - Last Heart Rate in normal range    Pulse Readings from Last 1 Encounters:  05/23/21 85          Passed - Valid encounter within last 12 months    Recent Outpatient Visits           1 month ago Hypertension associated with diabetes ([place])   [facility] [name], [name], MD   2 months ago Anemia, unspecified type   [facility] [name], [name], MD   2 months ago Restless legs   [place] [name], [name], MD   5 months ago Appointment canceled by hospital   [facility], Megan P, DO   6 months ago Type 2 diabetes mellitus with morbid obesity ([place])   [place], Barbaraann Faster, NP       Future Appointments             Tomorrow [name], [name], MD [facility], [place]   In 1 month Tahiliani, Lennette Bihari, MD [place]   In 8 months  [facility], [place]

## 2021-06-06 NOTE — Telephone Encounter (Signed)
Notes to clinic: medication requested not on list Medication shows d/c   Requested Prescriptions  Pending Prescriptions Disp Refills   metoprolol succinate (TOPROL-XL) 25 MG 24 hr tablet [Pharmacy Med Name: METOPROLOL ER SUCCINATE 25MG  TABS] 90 tablet 0    Sig: TAKE 1 TABLET(25 MG) BY MOUTH DAILY      Cardiovascular:  Beta Blockers Failed - 06/06/2021  3:13 AM      Failed - Last BP in normal range    BP Readings from Last 1 Encounters:  05/23/21 (!) 158/94          Passed - Last Heart Rate in normal range    Pulse Readings from Last 1 Encounters:  05/23/21 85          Passed - Valid encounter within last 6 months    Recent Outpatient Visits           1 month ago Hypertension associated with diabetes ([place])   [facility] [name], [name], MD   2 months ago Anemia, unspecified type   [place] [name], [name], MD   2 months ago Restless legs   [place] [name], [name], MD   5 months ago Appointment canceled by hospital   [facility], Megan P, DO   6 months ago Type 2 diabetes mellitus with morbid obesity ([place])   [place], Henry T, NP       Future Appointments             Tomorrow [name], [name], MD [facility], [facility]   In 1 month Tahiliani, Lennette Bihari, MD [facility]   In 8 months  [place], [facility]              Signed Prescriptions Disp Refills   ramipril (ALTACE) 10 MG capsule 90 capsule 0    Sig: TAKE 1 CAPSULE(10 MG) BY MOUTH DAILY      Cardiovascular:  ACE Inhibitors Failed - 06/06/2021  3:13 AM      Failed - Last BP in normal range    BP Readings from Last 1 Encounters:  05/23/21 (!) 158/94          Passed - Cr in normal range and within 180 days    Creatinine  Date Value Ref Range Status  07/25/2014 1.21 0.60 - 1.30 mg/dL Final   Creatinine, Ser  Date Value Ref Range Status  05/31/2021 1.14 0.76 - 1.27 mg/dL Final           Passed - K in normal range and within 180 days    Potassium  Date Value Ref Range Status  05/31/2021 4.4 3.5 - 5.2 mmol/L Final  07/25/2014 3.7 3.5 - 5.1 mmol/L Final          Passed - Patient is not pregnant      Passed - Valid encounter within last 6 months    Recent Outpatient Visits           1 month ago Hypertension associated with diabetes ([place])   [facility] [name], [name], MD   2 months ago Anemia, unspecified type   [facility] [name], [name], MD   2 months ago Restless legs   [place], [name], MD   5 months ago Appointment canceled by hospital   [facility], Megan P, DO   6 months ago Type 2 diabetes mellitus with morbid obesity ([place])   [facility] [place], [place]  T, NP       Future Appointments             Tomorrow [name], [name], MD [facility], [place]   In 1 month Tahiliani, Lennette Bihari, MD [place]   In 8 months  [facility], [place]

## 2021-06-07 ENCOUNTER — Encounter: Payer: Self-pay | Admitting: Internal Medicine

## 2021-06-07 ENCOUNTER — Other Ambulatory Visit: Payer: Self-pay

## 2021-06-07 ENCOUNTER — Ambulatory Visit (INDEPENDENT_AMBULATORY_CARE_PROVIDER_SITE_OTHER): Payer: Medicare HMO | Admitting: Internal Medicine

## 2021-06-07 VITALS — BP 116/73 | HR 90 | Temp 98.0°F | Ht 68.23 in | Wt 256.8 lb

## 2021-06-07 DIAGNOSIS — I4891 Unspecified atrial fibrillation: Secondary | ICD-10-CM | POA: Diagnosis not present

## 2021-06-07 DIAGNOSIS — E1169 Type 2 diabetes mellitus with other specified complication: Secondary | ICD-10-CM

## 2021-06-07 DIAGNOSIS — E139 Other specified diabetes mellitus without complications: Secondary | ICD-10-CM

## 2021-06-07 DIAGNOSIS — L989 Disorder of the skin and subcutaneous tissue, unspecified: Secondary | ICD-10-CM | POA: Diagnosis not present

## 2021-06-07 DIAGNOSIS — Q2730 Arteriovenous malformation, site unspecified: Secondary | ICD-10-CM | POA: Diagnosis not present

## 2021-06-07 DIAGNOSIS — E785 Hyperlipidemia, unspecified: Secondary | ICD-10-CM | POA: Diagnosis not present

## 2021-06-07 MED ORDER — DAPAGLIFLOZIN PROPANEDIOL 10 MG PO TABS: 10.0000 mg | ORAL_TABLET | Freq: Every day | ORAL | 6 refills | Status: DC

## 2021-06-07 MED ORDER — METFORMIN HCL ER 500 MG PO TB24: 500.0000 mg | ORAL_TABLET | Freq: Two times a day (BID) | ORAL | 5 refills | Status: DC

## 2021-06-07 NOTE — Progress Notes (Signed)
Subjective:    Patient ID: Ernest [name], male    DOB: [birth year], 70 y.o.   MRN: [id number]  Chief Complaint  Patient presents with   Anemia    Patient has had a recent colonoscopy, with precancerous polyps, and iron infusion.     HPI: [name] is a 70 y.o. 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 S/p IV iron in may   Anemia Presents for follow-up visit. There has been no abdominal pain, anorexia, bruising/bleeding easily, malaise/fatigue, pallor, palpitations or paresthesias.  Diabetes He presents for his follow-up diabetic visit. He has type 2 diabetes mellitus. Pertinent negatives for hypoglycemia include no headaches or pallor. Pertinent negatives for diabetes include no blurred vision, no chest pain, no fatigue, no foot paresthesias, no foot ulcerations, no polydipsia, no polyphagia, no polyuria, no visual change and no weakness.  Heart Problem This is a chronic (pt has a ho afib) problem. Pertinent negatives include no abdominal pain, anorexia, chest pain, fatigue, headaches, joint swelling, myalgias, nausea, visual change or weakness.   Chief Complaint  Patient presents with   Anemia    Patient has had a recent colonoscopy, with precancerous polyps, and iron infusion.     Relevant past medical, surgical, family and social history reviewed and updated as indicated. Interim medical history since our last visit reviewed. Allergies and medications reviewed and updated.  Review of Systems  Constitutional:  Negative for fatigue and malaise/fatigue.  Eyes:  Negative for blurred vision.  Cardiovascular:  Negative for chest pain and palpitations.  Gastrointestinal:  Negative for abdominal pain, anorexia and nausea.  Endocrine: Negative for polydipsia, polyphagia and polyuria.  Musculoskeletal:  Negative for joint swelling and myalgias.  Skin:   Negative for pallor.  Neurological:  Negative for weakness, headaches and paresthesias.  Hematological:  Does not bruise/bleed easily.   Per HPI unless specifically indicated above     Objective:    BP 116/73   Pulse 90   Temp 98 F (36.7 C) (Oral)   Ht 5' 8.23" (1.733 m)   Wt 256 lb 12.8 oz (116.5 kg)   SpO2 98%   BMI 38.79 kg/m   Wt Readings from Last 3 Encounters:  06/07/21 256 lb 12.8 oz (116.5 kg)  05/23/21 266 lb (120.7 kg)  05/12/21 266 lb (120.7 kg)    Physical Exam Vitals and nursing note reviewed.  Constitutional:      General: He is not in acute distress.    Appearance: Normal appearance. He is not ill-appearing or diaphoretic.  HENT:     Head: Normocephalic and atraumatic.     Right Ear: Tympanic membrane and external ear normal. There is no impacted cerumen.     Left Ear: External ear normal.     Nose: No congestion or rhinorrhea.     Mouth/Throat:     Pharynx: No oropharyngeal exudate or posterior oropharyngeal erythema.  Eyes:     Conjunctiva/sclera: Conjunctivae normal.     Pupils: Pupils are equal, round, and reactive to light.  Cardiovascular:     Rate and Rhythm: Normal rate and regular rhythm.     Heart sounds: No murmur heard.   No friction rub. No gallop.  Pulmonary:     Effort: No respiratory distress.     Breath sounds: No stridor. No wheezing or rhonchi.  Chest:  Chest wall: No tenderness.  Abdominal:     General: Abdomen is flat. Bowel sounds are normal.     Palpations: Abdomen is soft. There is no mass.     Tenderness: There is no abdominal tenderness.  Musculoskeletal:     Cervical back: Normal range of motion and neck supple. No rigidity or tenderness.     Left lower leg: No edema.  Skin:    General: Skin is warm and dry.  Neurological:     Mental Status: He is alert.         Assessment & Plan:  Anemia sec to [facility] sees Dr. Darene Lamer for such recent cscope and iv iron feels better with fatigue and symptoms.    Ref. Range  04/04/2021 11:35 04/04/2021 11:36 05/31/2021 14:16 05/31/2021 14:17  Hemoglobin Latest Ref Range: 13.0 - 17.7 g/dL 10.6 (L)   12.2 (L)  HCT Latest Ref Range: 37.5 - 51.0 % 35.6 (L)   39.1  MCV Latest Ref Range: 79 - 97 fL 80.4   83  MCH Latest Ref Range: 26.6 - 33.0 pg 23.9 (L)   25.8 (L)  MCHC Latest Ref Range: 31.5 - 35.7 g/dL 29.8 (L)   31.2 (L)  RDW Latest Ref Range: 11.6 - 15.4 % 15.0   17.0 (H)  Platelets Latest Ref Range: 150 - 450 x10E3/uL 204   211  nRBC Latest Ref Range: 0.0 - 0.2 % 0.0      2. DM is on farxiga and metfomrin cut back on metformin to 500 mg bid and increase farxiga to 10 mg daily    Ref. Range 03/17/2021 10:19 04/04/2021 11:35 04/04/2021 11:36 05/31/2021 14:16 05/31/2021 14:17  Glucose Latest Ref Range: 65 - 99 mg/dL  154 (H)   113 (H)  HB A1C (BAYER DCA - WAIVED) Latest Ref Range: <7.0 % 6.5   6.0    urine  microalbumin  diabetic diet plan given to pt  adviced regarding hypoglycemia and instructions given to pt today on how to prevent and treat the same if it were to occur. pt acknowledges the plan and voices understanding of the same.  exercise plan given and encouraged.   advice diabetic yearly podiatry, ophthalmology , nutritionist , dental check q 6 months   3. Afib on xarelto for such fu and mx per cards.  Is on cardizem, ramipril coreg and imdur   4. HLD : LDL at goal.  recheck FLP, check LFT's work on diet, SE of meds explained to pt. low fat and high fiber diet explained to pt.  5. Mulitple skin lesions Will refer to derm.   Problem List Items Addressed This Visit        Orders Placed This Encounter  Procedures   Ambulatory referral to Dermatology     Meds ordered this encounter  Medications   dapagliflozin propanediol (FARXIGA) 10 MG TABS tablet    Sig: Take 1 tablet (10 mg total) by mouth daily.    Dispense:  30 tablet    Refill:  6   metFORMIN (GLUCOPHAGE-XR) 500 MG 24 hr tablet    Sig: Take 1 tablet (500 mg total) by mouth in the morning and  at bedtime. 1 tab bid    Dispense:  60 tablet    Refill:  5     Follow up plan: No follow-ups on file.

## 2021-06-15 NOTE — Chronic Care Management (AMB) (Signed)
    Chronic Care Management Pharmacy Assistant   Name: Cheyenne Schumm  MRN: [id number] DOB: [birth year]   Reason for Encounter: Chart Review    Medications: Outpatient Encounter Medications as of 05/18/2021  Medication Sig   ACCU-CHEK GUIDE test strip    albuterol (VENTOLIN HFA) 108 (90 Base) MCG/ACT inhaler Inhale 2 puffs into the lungs every 4 (four) hours as needed for wheezing or shortness of breath. (Patient taking differently: Inhale 2 puffs into the lungs every 4 (four) hours as needed for wheezing or shortness of breath. Uses 2-3 times daily)   atorvastatin (LIPITOR) 80 MG tablet TAKE 1 TABLET(80 MG) BY MOUTH DAILY   carvedilol (COREG) 25 MG tablet Take 1 tablet (25 mg total) by mouth 2 (two) times daily.   clotrimazole-betamethasone (LOTRISONE) cream Apply 1 application topically 2 (two) times daily.   diltiazem (CARDIZEM CD) 180 MG 24 hr capsule Take 1 capsule (180 mg total) by mouth daily.   Fluticasone-Salmeterol (ADVAIR) 100-50 MCG/DOSE AEPB Inhale 1 puff into the lungs 2 (two) times daily.   furosemide (LASIX) 40 MG tablet Take 0.5 tablets (20 mg total) by mouth 2 (two) times daily.   gabapentin (NEURONTIN) 600 MG tablet TAKE 2 TABLETS(1200 MG) BY MOUTH AT BEDTIME   isosorbide mononitrate (IMDUR) 60 MG 24 hr tablet TAKE 1 TABLET(60 MG) BY MOUTH DAILY   nitroGLYCERIN (NITROSTAT) 0.4 MG SL tablet Place 1 tablet (0.4 mg total) under the tongue every 5 (five) minutes as needed for chest pain.   pantoprazole (PROTONIX) 40 MG tablet TAKE 1 TABLET(40 MG) BY MOUTH DAILY   polyethylene glycol-electrolytes (NULYTELY) 420 g solution 8,000 mL for 2 doses   XARELTO 20 MG TABS tablet TAKE 1 TABLET(20 MG) BY MOUTH DAILY WITH SUPPER   [DISCONTINUED] dapagliflozin propanediol (FARXIGA) 5 MG TABS tablet Take 1 tablet (5 mg total) by mouth daily before breakfast.   [DISCONTINUED] metFORMIN (GLUCOPHAGE-XR) 500 MG 24 hr tablet TAKE 2 TABLETS(1000 MG) BY MOUTH TWICE DAILY   [DISCONTINUED]  ramipril (ALTACE) 10 MG capsule Take 1 capsule (10 mg total) by mouth daily.   No facility-administered encounter medications on file as of 05/18/2021.   Reviewed chart for medication changes and adherence.  Recent OV, Consult or Hospital visit:  06/07/21 [name], MD- Note unavailable  No medication changes indicated  No gaps in adherence identified. Patient has follow up scheduled with pharmacy team. No further action required.   Lizbeth Bark Clinical Pharmacist Assistant [phone]

## 2021-06-16 ENCOUNTER — Encounter: Payer: Self-pay | Admitting: Internal Medicine

## 2021-06-18 ENCOUNTER — Other Ambulatory Visit: Payer: Self-pay | Admitting: Internal Medicine

## 2021-06-18 DIAGNOSIS — I482 Chronic atrial fibrillation, unspecified: Secondary | ICD-10-CM

## 2021-06-18 NOTE — Telephone Encounter (Signed)
Last RF 05/08/21 #90 Requested too soon

## 2021-06-27 ENCOUNTER — Telehealth: Payer: Self-pay

## 2021-06-27 NOTE — Chronic Care Management (AMB) (Signed)
Chronic Care Management Pharmacy Assistant   Name: Ernest [name]  MRN: [id number] DOB: 1951/04/16   Reason for Encounter: Disease State-General Adherence    Recent office visits:  06/07/21-Ernest Neomia Dear, MD (PCP) Seen for Anemia. Metfomrin cut back to 500 mg bid and increase farxiga to 10 mg daily. Ambulatory referral to Dermatology. Follow up in 3 months. 05/04/21-Ernest [name], MD (PCP) General follow up and ECHO results. Stop invokana and glipizide. Labs ordered. Ambulatory referral to Ophthalmology Recent consult visits:  05/12/21-Ernest [name] (Cardiology) Follow up. EKG completed. Follow up in 6 months Follow up in 4 weeks. 04/18/21-Ernest Wilson, RN (Oncology) Iron Infusion. 04/15/21-(Oncology) Iron Infusion. 04/13/21- (Oncology) Iron Infusion 04/11/21-Ernest Rao, MD (Oncology) Start on 5 doses of Venofer. Hospital visits:  None in previous 6 months  Medications: Outpatient Encounter Medications as of 06/27/2021  Medication Sig   ACCU-CHEK GUIDE test strip    albuterol (VENTOLIN HFA) 108 (90 Base) MCG/ACT inhaler Inhale 2 puffs into the lungs every 4 (four) hours as needed for wheezing or shortness of breath. (Patient taking differently: Inhale 2 puffs into the lungs every 4 (four) hours as needed for wheezing or shortness of breath. Uses 2-3 times daily)   atorvastatin (LIPITOR) 80 MG tablet TAKE 1 TABLET(80 MG) BY MOUTH DAILY   carvedilol (COREG) 25 MG tablet Take 1 tablet (25 mg total) by mouth 2 (two) times daily.   clotrimazole-betamethasone (LOTRISONE) cream Apply 1 application topically 2 (two) times daily.   dapagliflozin propanediol (FARXIGA) 10 MG TABS tablet Take 1 tablet (10 mg total) by mouth daily.   diltiazem (CARDIZEM CD) 180 MG 24 hr capsule Take 1 capsule (180 mg total) by mouth daily.   Fluticasone-Salmeterol (ADVAIR) 100-50 MCG/DOSE AEPB Inhale 1 puff into the lungs 2 (two) times daily.   furosemide (LASIX) 40 MG tablet Take 0.5 tablets (20 mg  total) by mouth 2 (two) times daily.   gabapentin (NEURONTIN) 600 MG tablet TAKE 2 TABLETS(1200 MG) BY MOUTH AT BEDTIME   isosorbide mononitrate (IMDUR) 60 MG 24 hr tablet TAKE 1 TABLET(60 MG) BY MOUTH DAILY   metFORMIN (GLUCOPHAGE-XR) 500 MG 24 hr tablet Take 1 tablet (500 mg total) by mouth in the morning and at bedtime. 1 tab bid   nitroGLYCERIN (NITROSTAT) 0.4 MG SL tablet Place 1 tablet (0.4 mg total) under the tongue every 5 (five) minutes as needed for chest pain.   pantoprazole (PROTONIX) 40 MG tablet TAKE 1 TABLET(40 MG) BY MOUTH DAILY   polyethylene glycol-electrolytes (NULYTELY) 420 g solution 8,000 mL for 2 doses   ramipril (ALTACE) 10 MG capsule TAKE 1 CAPSULE(10 MG) BY MOUTH DAILY   XARELTO 20 MG TABS tablet TAKE 1 TABLET(20 MG) BY MOUTH DAILY WITH SUPPER   No facility-administered encounter medications on file as of 06/27/2021.    Have you had any problems recently with your health? Patient states he has no problems with his health at this time.  Have you had any problems with your pharmacy? Patient states he would like to change his pharmacy to the one at [place]. [name], [name].  What issues or side effects are you having with your medications? Patient states he has no issues or side effects with his medications.  What would you like me to pass along to Roel Cluck for them to help you with?  Patient states there is nothing at this time.  What can we do to take care of you better? Patient states there is nothing at  this time.  Informed patient about his appointment being cancelld on 06/29/21 @11 :15 am with Birdena Crandall, CPP and will call back to reschedule his appointment.  Star Rating Drugs: Metformin 500 mg Last filled:06/09/21 30 DS Farxiga 10 mg Last filled:06/12/21 30 DS Ramipril 10 mg Last filled:06/06/21 90 DS Atorvastatin 80 mg Last filled:06/03/21 90 DS  Myriam Elta [place], [place]

## 2021-06-29 ENCOUNTER — Telehealth: Payer: Self-pay

## 2021-07-06 ENCOUNTER — Telehealth: Payer: Self-pay | Admitting: *Deleted

## 2021-07-06 NOTE — Telephone Encounter (Signed)
Mr Ernest [name] MRN [id number] came in and he is out of Xarelto for 3 weeks now and cannot afford to get it otherwise It is $400 at pharmacy

## 2021-07-06 NOTE — Telephone Encounter (Signed)
Please advise 

## 2021-07-06 NOTE — Telephone Encounter (Signed)
Pl let pt know needs to d/w cardiology about alternative meds, the receptionist Ms. Doroteo Bradford says he has been OUT of his meds x 3 weeks and he has a ho Afib, it would be appropriate for him to d/w them and see if theres anything he can take instead.Ernest [name]

## 2021-07-07 ENCOUNTER — Telehealth: Payer: Self-pay | Admitting: Cardiovascular Disease

## 2021-07-07 NOTE — Telephone Encounter (Signed)
Pt c/o medication issue:  1. Name of Medication: Xarelto 20mg    2. How are you currently taking this medication (dosage and times per day)? 1 tablet 20mg   3. Are you having a reaction (difficulty breathing--STAT)? no  4. What is your medication issue? Cost went up

## 2021-07-07 NOTE — Telephone Encounter (Signed)
Application completed with all necessary documents. Patient only needs to provide number of people in household, sign, and then have provider sign then fax to number on application. I did not get to call him today with this information and Triage can call with this update.

## 2021-07-07 NOTE — Telephone Encounter (Signed)
Called multiple times and no answer

## 2021-07-07 NOTE — Telephone Encounter (Signed)
Spoke with patient and explained that he should call her cardio doctor that prescribed the medication and see what alternative medication they can prescribe. Patient verbalized understanding.

## 2021-07-08 NOTE — Telephone Encounter (Signed)
Patient came by office, picked up samples, and signed application for assistance. Provider has signed and will fax then file in samples closet file box.

## 2021-07-08 NOTE — Telephone Encounter (Signed)
Spoke with patient and he reports not being able to afford Xarelto. Patient reports not taking this medication for the last 3 weeks. Patient assistance application completed and advised he would need to come by office to sign in order to fax. Updated application to include social security number and amount of people in the household. Providing samples pending application review.  Medication Samples have been provided to the patient.  Drug name: Xarelto        Strength: 20 mg         Qty: 4 bottles   LOT: [id number]   Exp.Date: 11/24

## 2021-07-11 ENCOUNTER — Inpatient Hospital Stay: Payer: Medicare HMO | Admitting: Oncology

## 2021-07-11 ENCOUNTER — Telehealth: Payer: Self-pay | Admitting: Oncology

## 2021-07-11 ENCOUNTER — Inpatient Hospital Stay: Payer: Medicare HMO

## 2021-07-11 NOTE — Telephone Encounter (Signed)
Spoke with patient about today's missed appt. Patient stated that he "forgot". Got patient rescheduled and sending updated appt reminder in the mail.

## 2021-08-03 ENCOUNTER — Telehealth: Payer: Self-pay | Admitting: Cardiovascular Disease

## 2021-08-03 ENCOUNTER — Ambulatory Visit: Payer: Medicare HMO | Admitting: Gastroenterology

## 2021-08-03 ENCOUNTER — Other Ambulatory Visit: Payer: Self-pay

## 2021-08-03 VITALS — BP 170/82 | HR 81 | Temp 98.2°F | Ht 68.0 in | Wt 259.8 lb

## 2021-08-03 DIAGNOSIS — D500 Iron deficiency anemia secondary to blood loss (chronic): Secondary | ICD-10-CM

## 2021-08-03 DIAGNOSIS — Q2730 Arteriovenous malformation, site unspecified: Secondary | ICD-10-CM | POA: Diagnosis not present

## 2021-08-03 NOTE — Progress Notes (Signed)
Ernest Antigua, MD [address]  [place]  [place], [place] [address]  Main: [phone]  Fax: [phone]   Primary Care Physician: [name], MD   Chief complaint: Anemia  HPI: Ernest [name] is a 70 y.o. 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 The patient denies abdominal or flank pain, anorexia, nausea or vomiting, dysphagia, change in bowel habits or black or bloody stools or weight loss.  Anemia has improved post EGD and colonoscopy with treatment of AVMs.   ROS: All ROS reviewed and negative except as per HPI   Past Medical History:  Diagnosis Date   A-fib ([place])    Anemia    Asthma    COPD (chronic obstructive pulmonary disease) (HCC)    Diabetes mellitus    Type II   Early Pulmonary fibrosis (HCC)    GERD (gastroesophageal reflux disease)    History of echocardiogram    a. 02/2017 Echo: EF 60-65%, no rwma, mild MR, mod dil LA. Nl RV fxn. PASP 40mHg.   Hyperlipidemia    Hypertension    Morbid obesity ([facility]    Myocardial infarction ([facility]    "light"   Non-obstructive CAD (coronary artery disease)    a. 2011 Cath: nonobs dzs; b. 07/2014 Cath: LM 30, LAD nl, LCX nl, RCA 20p, 320m  Permanent atrial fibrillation (HCC)    a. CHA2DS2VASc = 4-->xarelto.   Polyp of sigmoid colon     Past Surgical History:  Procedure Laterality Date   ANKLE SURGERY     [facility]   [place] [facility]     COLONOSCOPY WITH PROPOFOL N/A 06/26/2018   Procedure: COLONOSCOPY WITH PROPOFOL;  Surgeon: TaVirgel ManifoldMD;  Location: [facility] ENDOSCOPY;  Service: Endoscopy;  Laterality: N/A;   COLONOSCOPY WITH PROPOFOL N/A 05/23/2021   Procedure: COLONOSCOPY WITH PROPOFOL;  Surgeon: TaVirgel ManifoldMD;  Location: [facility] ENDOSCOPY;  Service: Endoscopy;  Laterality: N/A;   CORONARY ARTERY BYPASS GRAFT      CORONARY STENT INTERVENTION N/A 03/07/2019   Procedure: CORONARY STENT INTERVENTION;  Surgeon: EnNelva BushMD;  [facility];  Service: Cardiovascular;  Laterality: N/A;   ENTEROSCOPY N/A 08/05/2018   Procedure: ENTEROSCOPY;  Surgeon: TaVirgel ManifoldMD;  Location: [facility] ENDOSCOPY;  Service: Endoscopy;  Laterality: N/A;   ESOPHAGOGASTRODUODENOSCOPY (EGD) WITH PROPOFOL N/A 06/26/2018   Procedure: ESOPHAGOGASTRODUODENOSCOPY (EGD) WITH PROPOFOL;  Surgeon: TaVirgel ManifoldMD;  Location: [facility] ENDOSCOPY;  Service: Endoscopy;  Laterality: N/A;   ESOPHAGOGASTRODUODENOSCOPY (EGD) WITH PROPOFOL N/A 05/23/2021   Procedure: PUSH ESOPHAGOGASTRODUODENOSCOPY (EGD) WITH PROPOFOL;  Surgeon: TaVirgel ManifoldMD;  Location: [facility] ENDOSCOPY;  Service: Endoscopy;  Laterality: N/A;   LEFT HEART CATH AND CORONARY ANGIOGRAPHY N/A 03/07/2019   Procedure: LEFT HEART CATH AND CORONARY ANGIOGRAPHY;  Surgeon: EnNelva BushMD;  [facility];  Service: Cardiovascular;  Laterality: N/A;   TESTICLE SURGERY  70's   TUMOR EXCISION     Neck and finger; benign   WRIST SURGERY      Prior to Admission medications   Medication Sig Start Date End Date Taking? Authorizing Provider  ACCU-CHEK GUIDE test strip  07/08/20  Yes [provider]  albuterol (VENTOLIN HFA) 108 (90 Base) MCG/ACT inhaler Inhale 2 puffs into the lungs every 4 (  four) hours as needed for wheezing or shortness of breath. Patient taking differently: Inhale 2 puffs into the lungs every 4 (four) hours as needed for wheezing or shortness of breath. Uses 2-3 times daily 03/11/21  Yes [name], [name], MD  atorvastatin (LIPITOR) 80 MG tablet TAKE 1 TABLET(80 MG) BY MOUTH DAILY 03/11/21  Yes [name], [name], MD  clotrimazole-betamethasone (LOTRISONE) cream Apply 1 application topically 2 (two) times daily. 03/11/21  Yes [name], [name], MD  dapagliflozin propanediol (FARXIGA) 10 MG TABS tablet Take 1 tablet (10 mg total)  by mouth daily. 06/07/21  Yes [name], [name], MD  Fluticasone-Salmeterol (ADVAIR) 100-50 MCG/DOSE AEPB Inhale 1 puff into the lungs 2 (two) times daily. 04/08/21  Yes [name], [name], MD  furosemide (LASIX) 40 MG tablet Take 0.5 tablets (20 mg total) by mouth 2 (two) times daily. 01/12/20  Yes Johnson, Megan P, DO  gabapentin (NEURONTIN) 600 MG tablet TAKE 2 TABLETS(1200 MG) BY MOUTH AT BEDTIME 03/11/21  Yes [name], [name], MD  isosorbide mononitrate (IMDUR) 60 MG 24 hr tablet TAKE 1 TABLET(60 MG) BY MOUTH DAILY 03/11/21  Yes [name], [name], MD  metFORMIN (GLUCOPHAGE-XR) 500 MG 24 hr tablet Take 1 tablet (500 mg total) by mouth in the morning and at bedtime. 1 tab bid 06/07/21  Yes [name], [name], MD  nitroGLYCERIN (NITROSTAT) 0.4 MG SL tablet Place 1 tablet (0.4 mg total) under the tongue every 5 (five) minutes as needed for chest pain. 03/18/21  Yes Furth, Cadence H, PA-C  pantoprazole (PROTONIX) 40 MG tablet TAKE 1 TABLET(40 MG) BY MOUTH DAILY 03/11/21  Yes [name], [name], MD  ramipril (ALTACE) 10 MG capsule TAKE 1 CAPSULE(10 MG) BY MOUTH DAILY 06/06/21  Yes [name], [name], MD  XARELTO 20 MG TABS tablet TAKE 1 TABLET(20 MG) BY MOUTH DAILY WITH SUPPER 05/08/21  Yes [name], [name], MD  carvedilol (COREG) 25 MG tablet Take 1 tablet (25 mg total) by mouth 2 (two) times daily. 04/04/21 07/03/21  Furth, Cadence H, PA-C  diltiazem (CARDIZEM CD) 180 MG 24 hr capsule Take 1 capsule (180 mg total) by mouth daily. 03/11/21 06/09/21  [name], MD    Family History  Problem Relation Age of Onset   Hypertension Mother    Hyperlipidemia Mother    Diabetes Mother    Heart disease Mother        CABG   Pneumonia Father        rare type   Diabetes Other    Depression Other    Coronary artery disease Other    Alcohol abuse Other    Hypertension Other    Hyperlipidemia Other    Cancer Brother    Kidney Stones Brother      Social History   Tobacco Use   Smoking status: Former    Packs/day: 4.00    Years: 28.00    Pack  years: 112.00    Types: Cigarettes    Quit date: 08/25/1993    Years since quitting: 27.9   Smokeless tobacco: Never  Vaping Use   Vaping Use: Never used  Substance Use Topics   Alcohol use: Not Currently   Drug use: No    Allergies as of 08/03/2021 - Review Complete 08/03/2021  Allergen Reaction Noted   Doxycycline Other (See Comments) 02/03/2019    Physical Examination:  Constitutional: General:   Alert,  Well-developed, well-nourished, pleasant and cooperative in NAD BP (!) 170/82   Pulse 81   Temp 98.2 F (36.8 C) (Oral)   Ht '5\' 8"'$  (1.727 m)  Wt 259 lb 12.8 oz (117.8 kg)   BMI 39.50 kg/m   Respiratory: Normal respiratory effort  Gastrointestinal:  Soft, non-tender and non-distended without masses, hepatosplenomegaly or hernias noted.  No guarding or rebound tenderness.     Cardiac: No clubbing or edema.  No cyanosis. Normal posterior tibial pedal pulses noted.  Psych:  Alert and cooperative. Normal mood and affect.  Musculoskeletal:  Normal gait. Head normocephalic, atraumatic. Symmetrical without gross deformities. 5/5 Lower extremity strength bilaterally.  Skin: Warm. Intact without significant lesions or rashes. No jaundice.  Neck: Supple, trachea midline  Lymph: No cervical lymphadenopathy  Psych:  Alert and oriented x3, Alert and cooperative. Normal mood and affect.  Labs: CMP     Component Value Date/Time   NA 143 05/31/2021 1417   NA 139 07/25/2014 0425   K 4.4 05/31/2021 1417   K 3.7 07/25/2014 0425   CL 102 05/31/2021 1417   CL 105 07/25/2014 0425   CO2 23 05/31/2021 1417   CO2 29 07/25/2014 0425   GLUCOSE 113 (H) 05/31/2021 1417   GLUCOSE 154 (H) 04/04/2021 1135   GLUCOSE 125 (H) 07/25/2014 0425   BUN 12 05/31/2021 1417   BUN 16 07/25/2014 0425   CREATININE 1.14 05/31/2021 1417   CREATININE 1.21 07/25/2014 0425   CALCIUM 9.7 05/31/2021 1417   CALCIUM 8.9 07/25/2014 0425   PROT 6.4 05/31/2021 1417   ALBUMIN 4.5 05/31/2021 1417   AST  13 05/31/2021 1417   AST 34 08/01/2017 0902   ALT 9 05/31/2021 1417   ALT 33 08/01/2017 0902   ALKPHOS 91 05/31/2021 1417   BILITOT 1.4 (H) 05/31/2021 1417   GFRNONAA >60 04/04/2021 1135   GFRNONAA >60 07/25/2014 0425   GFRAA 81 11/19/2020 1109   GFRAA >60 07/25/2014 0425   Lab Results  Component Value Date   WBC 7.3 05/31/2021   HGB 12.2 (L) 05/31/2021   HCT 39.1 05/31/2021   MCV 83 05/31/2021   PLT 211 05/31/2021    Imaging Studies:   Assessment and Plan:   Ernest [name] is a 70 y.o. y/o male here for follow-up of anemia and AVMs  Patient is continuing follow-up with hematology and receiving IV iron transfusions as needed Hemoglobin has improved to 12.2 at this time  Patient does not have any evidence of GI bleed.  As long as anemia continues to improve, he does not need any further endoscopic interventions at this time.  However, if anemia worsens, we can consider further procedures such as small bowel capsule study as necessary  Patient encouraged to continue follow-up with hematology to schedule IV iron infusions as necessary  Follow-up in 6 months or earlier if needed      Dr Ernest [name]

## 2021-08-03 NOTE — Telephone Encounter (Signed)
Patient arrived in office to follow up on patient assistance application. Checked file box in samples closet and application was in there from when we sent it in. Advised that I would call to follow up on this and did have him sign new form to resend if for some reason they want updated application. He is about to run out and samples were given to him as listed below. He was appreciative for our assistance with no further questions at this time.  Medication Samples have been provided to the patient.  Drug name: Xarelto        Strength: 20 mg         Qty: 4 bottles   LOT: [id number]   Exp.Date: 11/24

## 2021-08-04 NOTE — Telephone Encounter (Signed)
[place] patient assistance foundation to check on the status of this patients application. Patient needs to spend $1172.00 on medications to qualify. Currently he has $800.00 spent and needs to spend additional $371.92. Once he meets this amount we will need to fax them a report of his out of pocket expense report and he would then qualify. Will update patient of this information and have him provide additional paperwork in order to resubmit.

## 2021-08-04 NOTE — Telephone Encounter (Signed)
Unable to reach patient at numbers listed in chart. Will continue to try again later.

## 2021-08-05 NOTE — Telephone Encounter (Signed)
Spoke with patient and we discussed what is needed for him to qualify for assistance. Requested that he call his pharmacies and request out of pocket expense report. If all total $1,172.00 then we can submit those reports for them to reconsider for medication assistance. He verbalized understanding of what is needed with no further questions at this time.

## 2021-08-09 ENCOUNTER — Encounter: Payer: Self-pay | Admitting: Oncology

## 2021-08-09 ENCOUNTER — Inpatient Hospital Stay: Payer: Medicare HMO | Admitting: Oncology

## 2021-08-09 ENCOUNTER — Inpatient Hospital Stay: Payer: Medicare HMO | Attending: Oncology

## 2021-08-09 VITALS — HR 80 | Temp 98.4°F | Resp 18 | Wt 262.0 lb

## 2021-08-09 DIAGNOSIS — Z79899 Other long term (current) drug therapy: Secondary | ICD-10-CM | POA: Diagnosis not present

## 2021-08-09 DIAGNOSIS — D500 Iron deficiency anemia secondary to blood loss (chronic): Secondary | ICD-10-CM | POA: Diagnosis not present

## 2021-08-09 DIAGNOSIS — D509 Iron deficiency anemia, unspecified: Secondary | ICD-10-CM | POA: Insufficient documentation

## 2021-08-09 LAB — CBC WITH DIFFERENTIAL/PLATELET
Abs Immature Granulocytes: 0.02 10*3/uL (ref 0.00–0.07)
Basophils Absolute: 0 10*3/uL (ref 0.0–0.1)
Basophils Relative: 1 %
Eosinophils Absolute: 0.2 10*3/uL (ref 0.0–0.5)
Eosinophils Relative: 3 %
HCT: 37.6 % — ABNORMAL LOW (ref 39.0–52.0)
Hemoglobin: 12 g/dL — ABNORMAL LOW (ref 13.0–17.0)
Immature Granulocytes: 0 %
Lymphocytes Relative: 36 %
Lymphs Abs: 2.4 10*3/uL (ref 0.7–4.0)
MCH: 26.3 pg (ref 26.0–34.0)
MCHC: 31.9 g/dL (ref 30.0–36.0)
MCV: 82.5 fL (ref 80.0–100.0)
Monocytes Absolute: 0.4 10*3/uL (ref 0.1–1.0)
Monocytes Relative: 6 %
Neutro Abs: 3.5 10*3/uL (ref 1.7–7.7)
Neutrophils Relative %: 54 %
Platelets: 172 10*3/uL (ref 150–400)
RBC: 4.56 MIL/uL (ref 4.22–5.81)
RDW: 14.9 % (ref 11.5–15.5)
WBC: 6.5 10*3/uL (ref 4.0–10.5)
nRBC: 0 % (ref 0.0–0.2)

## 2021-08-09 LAB — IRON AND TIBC
Iron: 45 ug/dL (ref 45–182)
Saturation Ratios: 10 % — ABNORMAL LOW (ref 17.9–39.5)
TIBC: 472 ug/dL — ABNORMAL HIGH (ref 250–450)
UIBC: 427 ug/dL

## 2021-08-09 LAB — FERRITIN: Ferritin: 27 ng/mL (ref 24–336)

## 2021-08-09 NOTE — Progress Notes (Signed)
Patient here for oncology follow-up appointment, concerns of diarrhea , chest pain and SOB

## 2021-08-11 ENCOUNTER — Encounter: Payer: Self-pay | Admitting: Oncology

## 2021-08-11 NOTE — Progress Notes (Signed)
I connected with Ernest [name] on 08/11/21 at  2:45 PM EDT by video enabled telemedicine visit and verified that I am speaking with the correct person using two identifiers.   I discussed the limitations, risks, security and privacy concerns of performing an evaluation and management service by telemedicine and the availability of in-person appointments. I also discussed with the patient that there may be a patient responsible charge related to this service. The patient expressed understanding and agreed to proceed.  Other persons participating in the visit and their role in the encounter:  none  Patient's location:  cancer center Provider's location:  home  Chief Complaint:  routine f/u of iron deficiency anemia  History of present illness:  patient is a 70 year old male seen by GI for iron deficiency anemia.  He was also found to have some hemoptysis in March 2019 and is being followed by pulmonary.  CT chest did not reveal any significant abnormalities.  He is on Xarelto for his A. fib.  He has had EGD and colonoscopy in 2013 and 2015.  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  EGD was normal.      Colonoscopy on 06/26/2018 showed 3 polyps in the transverse colon and one polyp in the sigmoid colon.  They were negative for malignancy.  No active bleeding noted. EGD showed salmon-colored mucosa suspicious for short segment Barrett's esophagus.  Erythematous mucosa in the antrum.  Few gastric polyps.  Normal duodenal bulb   Patient had evidence of iron deficiency anemia in the past and received 2 doses of Feraheme in May 2019.  Last seen in January 2020 and here to reestablish follow-up.  Labs were again consistent with iron deficiency anemia requiring Venofer   Interval history patient reports doing well. He has baseline fatigue but denies any blood in stool or urine   Review of Systems  Constitutional:   Positive for malaise/fatigue. Negative for chills, fever and weight loss.  HENT:  Negative for congestion, ear discharge and nosebleeds.   Eyes:  Negative for blurred vision.  Respiratory:  Negative for cough, hemoptysis, sputum production, shortness of breath and wheezing.   Cardiovascular:  Negative for chest pain, palpitations, orthopnea and claudication.  Gastrointestinal:  Negative for abdominal pain, blood in stool, constipation, diarrhea, heartburn, melena, nausea and vomiting.  Genitourinary:  Negative for dysuria, flank pain, frequency, hematuria and urgency.  Musculoskeletal:  Negative for back pain, joint pain and myalgias.  Skin:  Negative for rash.  Neurological:  Negative for dizziness, tingling, focal weakness, seizures, weakness and headaches.  Endo/Heme/Allergies:  Does not bruise/bleed easily.  Psychiatric/Behavioral:  Negative for depression and suicidal ideas. The patient does not have insomnia.    Allergies  Allergen Reactions   Doxycycline Other (See Comments)    Mouth sores     Past Medical History:  Diagnosis Date   A-fib (HCC)    Anemia    Asthma    COPD (chronic obstructive pulmonary disease) (HCC)    Diabetes mellitus    Type II   Early Pulmonary fibrosis (HCC)    GERD (gastroesophageal reflux disease)    History of echocardiogram    a. 02/2017 Echo: EF 60-65%, no rwma, mild MR, mod dil LA. Nl RV fxn. PASP 6mHg.   Hyperlipidemia    Hypertension    Morbid obesity ([place]    Myocardial infarction ([facility]    "light"   Non-obstructive CAD (coronary artery disease)  a. 2011 Cath: nonobs dzs; b. 07/2014 Cath: LM 30, LAD nl, LCX nl, RCA 20p, 30m   Permanent atrial fibrillation (HCC)    a. CHA2DS2VASc = 4-->xarelto.   Polyp of sigmoid colon     Past Surgical History:  Procedure Laterality Date   ANKLE SURGERY     [facility]   [facility]   [facility]     COLONOSCOPY WITH PROPOFOL N/A 06/26/2018   Procedure: COLONOSCOPY WITH PROPOFOL;  Surgeon: TVirgel Manifold MD;  Location: [facility] ENDOSCOPY;  Service: Endoscopy;  Laterality: N/A;   COLONOSCOPY WITH PROPOFOL N/A 05/23/2021   Procedure: COLONOSCOPY WITH PROPOFOL;  Surgeon: TVirgel Manifold MD;  Location: [facility] ENDOSCOPY;  Service: Endoscopy;  Laterality: N/A;   CORONARY ARTERY BYPASS GRAFT     CORONARY STENT INTERVENTION N/A 03/07/2019   Procedure: CORONARY STENT INTERVENTION;  Surgeon: ENelva Bush MD;  [facility];  Service: Cardiovascular;  Laterality: N/A;   ENTEROSCOPY N/A 08/05/2018   Procedure: ENTEROSCOPY;  Surgeon: TVirgel Manifold MD;  Location: [facility] ENDOSCOPY;  Service: Endoscopy;  Laterality: N/A;   ESOPHAGOGASTRODUODENOSCOPY (EGD) WITH PROPOFOL N/A 06/26/2018   Procedure: ESOPHAGOGASTRODUODENOSCOPY (EGD) WITH PROPOFOL;  Surgeon: TVirgel Manifold MD;  Location: [facility] ENDOSCOPY;  Service: Endoscopy;  Laterality: N/A;   ESOPHAGOGASTRODUODENOSCOPY (EGD) WITH PROPOFOL N/A 05/23/2021   Procedure: PUSH ESOPHAGOGASTRODUODENOSCOPY (EGD) WITH PROPOFOL;  Surgeon: TVirgel Manifold MD;  Location: [facility] ENDOSCOPY;  Service: Endoscopy;  Laterality: N/A;   LEFT HEART CATH AND CORONARY ANGIOGRAPHY N/A 03/07/2019   Procedure: LEFT HEART CATH AND CORONARY ANGIOGRAPHY;  Surgeon: ENelva Bush MD;  [place] [facility];  Service: Cardiovascular;  Laterality: N/A;   TESTICLE SURGERY  70's   TUMOR EXCISION     Neck and finger; benign   WRIST SURGERY      Social History   Socioeconomic History   Marital status: Widowed    Spouse name: Not on file   Number of children: Not on file   Years of education: Not on file   Highest education level: 8th grade  Occupational History   Occupation: part time   Tobacco Use   Smoking status: Former    Packs/day: 4.00    Years: 28.00    Pack years: 112.00    Types: Cigarettes     Quit date: 08/25/1993    Years since quitting: 27.9   Smokeless tobacco: Never  Vaping Use   Vaping Use: Never used  Substance and Sexual Activity   Alcohol use: Not Currently   Drug use: No   Sexual activity: Not Currently  Other Topics Concern   Not on file  Social History Narrative   No regular exercise.   Social Determinants of Health   Financial Resource Strain: Low Risk    Difficulty of Paying Living Expenses: Not hard at all  Food Insecurity: No Food Insecurity   Worried About RCharity fundraiserin the Last Year: Never true   RBurtin the Last Year: Never true  Transportation Needs: No Transportation Needs   Lack of Transportation (Medical): No   Lack of Transportation (Non-Medical): No  Physical Activity: Inactive   Days of Exercise per Week: 0 days   Minutes of Exercise per Session: 0 min  Stress: No Stress Concern Present   Feeling of Stress :  Not at all  Social Connections: Not on file  Intimate Partner Violence: Not on file    Family History  Problem Relation Age of Onset   Hypertension Mother    Hyperlipidemia Mother    Diabetes Mother    Heart disease Mother        CABG   Pneumonia Father        rare type   Diabetes Other    Depression Other    Coronary artery disease Other    Alcohol abuse Other    Hypertension Other    Hyperlipidemia Other    Cancer Brother    Kidney Stones Brother      Current Outpatient Medications:    ACCU-CHEK GUIDE test strip, , Disp: , Rfl:    albuterol (VENTOLIN HFA) 108 (90 Base) MCG/ACT inhaler, Inhale 2 puffs into the lungs every 4 (four) hours as needed for wheezing or shortness of breath. (Patient taking differently: Inhale 2 puffs into the lungs every 4 (four) hours as needed for wheezing or shortness of breath. Uses 2-3 times daily), Disp: 18 g, Rfl: 6   atorvastatin (LIPITOR) 80 MG tablet, TAKE 1 TABLET(80 MG) BY MOUTH DAILY, Disp: 90 tablet, Rfl: 1   carvedilol (COREG) 25 MG tablet, Take 1 tablet  (25 mg total) by mouth 2 (two) times daily., Disp: 180 tablet, Rfl: 3   clotrimazole-betamethasone (LOTRISONE) cream, Apply 1 application topically 2 (two) times daily., Disp: 30 g, Rfl: 2   dapagliflozin propanediol (FARXIGA) 10 MG TABS tablet, Take 1 tablet (10 mg total) by mouth daily., Disp: 30 tablet, Rfl: 6   diltiazem (CARDIZEM CD) 180 MG 24 hr capsule, Take 1 capsule (180 mg total) by mouth daily., Disp: 90 capsule, Rfl: 3   Fluticasone-Salmeterol (ADVAIR) 100-50 MCG/DOSE AEPB, Inhale 1 puff into the lungs 2 (two) times daily., Disp: 1 each, Rfl: 3   furosemide (LASIX) 40 MG tablet, Take 0.5 tablets (20 mg total) by mouth 2 (two) times daily., Disp: 180 tablet, Rfl: 1   gabapentin (NEURONTIN) 600 MG tablet, TAKE 2 TABLETS(1200 MG) BY MOUTH AT BEDTIME, Disp: 180 tablet, Rfl: 0   isosorbide mononitrate (IMDUR) 60 MG 24 hr tablet, TAKE 1 TABLET(60 MG) BY MOUTH DAILY, Disp: 30 tablet, Rfl: 3   metFORMIN (GLUCOPHAGE-XR) 500 MG 24 hr tablet, Take 1 tablet (500 mg total) by mouth in the morning and at bedtime. 1 tab bid, Disp: 60 tablet, Rfl: 5   nitroGLYCERIN (NITROSTAT) 0.4 MG SL tablet, Place 1 tablet (0.4 mg total) under the tongue every 5 (five) minutes as needed for chest pain., Disp: 25 tablet, Rfl: 2   pantoprazole (PROTONIX) 40 MG tablet, TAKE 1 TABLET(40 MG) BY MOUTH DAILY, Disp: 90 tablet, Rfl: 2   ramipril (ALTACE) 10 MG capsule, TAKE 1 CAPSULE(10 MG) BY MOUTH DAILY, Disp: 90 capsule, Rfl: 0   XARELTO 20 MG TABS tablet, TAKE 1 TABLET(20 MG) BY MOUTH DAILY WITH SUPPER, Disp: 90 tablet, Rfl: 0   metoprolol succinate (TOPROL-XL) 50 MG 24 hr tablet, Take 50 mg by mouth daily., Disp: , Rfl:   No results found.  No images are attached to the encounter.   CMP Latest Ref Rng & Units 05/31/2021  Glucose 65 - 99 mg/dL 113(H)  BUN 8 - 27 mg/dL 12  Creatinine 0.76 - 1.27 mg/dL 1.14  Sodium 134 - 144 mmol/L 143  Potassium 3.5 - 5.2 mmol/L 4.4  Chloride 96 - 106 mmol/L 102  CO2 20 - 29  mmol/L 23  Calcium  8.6 - 10.2 mg/dL 9.7  Total Protein 6.0 - 8.5 g/dL 6.4  Total Bilirubin 0.0 - 1.2 mg/dL 1.4(H)  Alkaline Phos 44 - 121 IU/L 91  AST 0 - 40 IU/L 13  ALT 0 - 44 IU/L 9   CBC Latest Ref Rng & Units 08/09/2021  WBC 4.0 - 10.5 K/uL 6.5  Hemoglobin 13.0 - 17.0 g/dL 12.0(L)  Hematocrit 39.0 - 52.0 % 37.6(L)  Platelets 150 - 400 K/uL 172     Observation/objective:appears in no acute distress over video visit today. Bretahing is not labored  Assessment and plan: Patient is a 69 year old male here for a routine follow-up of iron deficiency anemia  Patient's H&H presently is 12/37.6 which is close to his baseline.  Improved as compared to 10.6 in April 2022.  He did receive IV iron at that time as well.  Today his ferritin levels continue to remain low at 27 and iron studies reveal elevated TIBC of 472 and iron saturation of 10%.  These studies came back after the patient visit was done.  We will therefore reach out to the patient to figure out if he is interested in receiving more IV iron at this time.  Repeat CBC ferritin and iron studies in 3 in 6 months and I will see him back in 6 months.Patient recently had a colonoscopy by Dr. Bonna Gains in June 2022 which showed multiple colonic angiectasia's that were treated with APC's he also had a small bowel capsule endoscopy which showed angioectasias in the stomach as well as multiple gastric polyps.  I suspect this is the cause of his iron deficiency anemia.  Follow-up instructions:as   I discussed the assessment and treatment plan with the patient. The patient was provided an opportunity to ask questions and all were answered. The patient agreed with the plan and demonstrated an understanding of the instructions.   The patient was advised to call back or seek an in-person evaluation if the symptoms worsen or if the condition fails to improve as anticipated.    Visit Diagnosis: 1. Iron deficiency anemia due to chronic blood loss      Dr. [name], MD, MPH [facility] Tel- [phone] 08/11/2021 3:04 PM

## 2021-08-11 NOTE — Progress Notes (Signed)
Called the patient and got his voicemail.  Left him a message stating Dr. Janese Banks left me a message that he has a mild anemia with evidence of iron deficiency.  He has been bleeding AVMs in the colon and wanted to know if the patient would like to come in and get some IV iron treatments.  And hopefully that would help him feel better.  I have asked him to call me at [phone] if he is interested in getting iron treatments.

## 2021-08-12 ENCOUNTER — Telehealth: Payer: Self-pay | Admitting: *Deleted

## 2021-08-12 NOTE — Telephone Encounter (Signed)
Called pt and let pt knwo that his hgb a little lower and iron numbers are little low and dr Hedwig Morton recommends for pt to get iron tx. If he is ok with this. He is agreeable and I will check  out what insurance treatment he can have and then get scheduler to call and make appts for him. And he can come anytime he said

## 2021-08-15 ENCOUNTER — Telehealth: Payer: Self-pay | Admitting: Oncology

## 2021-08-15 NOTE — Telephone Encounter (Signed)
Spoke with patient to confirm upcoming iron infusions. Patient was agreeable to all days and times and will receive a print out at his first appt on 8/31.

## 2021-08-15 NOTE — Progress Notes (Signed)
Pt wants iron, Caryl Comes says that it must be venfor. I have left message to have jennifer to make appts and let him know

## 2021-08-17 ENCOUNTER — Other Ambulatory Visit: Payer: Self-pay

## 2021-08-17 ENCOUNTER — Other Ambulatory Visit: Payer: Self-pay | Admitting: Oncology

## 2021-08-17 ENCOUNTER — Inpatient Hospital Stay: Payer: Medicare HMO

## 2021-08-17 VITALS — BP 135/62 | HR 60 | Temp 97.0°F | Resp 18

## 2021-08-17 DIAGNOSIS — D500 Iron deficiency anemia secondary to blood loss (chronic): Secondary | ICD-10-CM

## 2021-08-17 DIAGNOSIS — D509 Iron deficiency anemia, unspecified: Secondary | ICD-10-CM | POA: Diagnosis not present

## 2021-08-17 DIAGNOSIS — Z79899 Other long term (current) drug therapy: Secondary | ICD-10-CM | POA: Diagnosis not present

## 2021-08-17 MED ORDER — IRON SUCROSE 20 MG/ML IV SOLN
200.0000 mg | Freq: Once | INTRAVENOUS | Status: AC
  Administered 2021-08-17: 200 mg via INTRAVENOUS
  Filled 2021-08-17: qty 10

## 2021-08-17 MED ORDER — SODIUM CHLORIDE 0.9 % IV SOLN
Freq: Once | INTRAVENOUS | Status: AC
  Filled 2021-08-17: qty 250

## 2021-08-17 MED ORDER — SODIUM CHLORIDE 0.9 % IV SOLN: 200.0000 mg | INTRAVENOUS | Status: DC

## 2021-08-17 NOTE — Patient Instructions (Signed)
[facility]   Discharge Instructions: Thank you for choosing [facility] to provide your oncology and hematology care.  If you have a lab appointment with the Cancer Center, please go directly to the Cancer Center and check in at the registration area.  We strive to give you quality time with your provider. You may need to reschedule your appointment if you arrive late (15 or more minutes).  Arriving late affects you and other patients whose appointments are after yours.  Also, if you miss three or more appointments without notifying the office, you may be dismissed from the clinic at the provider's discretion.      For prescription refill requests, have your pharmacy contact our office and allow 72 hours for refills to be completed.    Today you received the following: Venofer.      BELOW ARE SYMPTOMS THAT SHOULD BE REPORTED IMMEDIATELY: *FEVER GREATER THAN 100.4 F (38 C) OR HIGHER *CHILLS OR SWEATING *NAUSEA AND VOMITING THAT IS NOT CONTROLLED WITH YOUR NAUSEA MEDICATION *UNUSUAL SHORTNESS OF BREATH *UNUSUAL BRUISING OR BLEEDING *URINARY PROBLEMS (pain or burning when urinating, or frequent urination) *BOWEL PROBLEMS (unusual diarrhea, constipation, pain near the anus) TENDERNESS IN MOUTH AND THROAT WITH OR WITHOUT PRESENCE OF ULCERS (sore throat, sores in mouth, or a toothache) UNUSUAL RASH, SWELLING OR PAIN  UNUSUAL VAGINAL DISCHARGE OR ITCHING   Items with * indicate a potential emergency and should be followed up as soon as possible or go to the Emergency Department if any problems should occur.  Should you have questions after your visit or need to cancel or reschedule your appointment, please contact [facility]  [phone] and follow the prompts.  Office hours are 8:00 a.m. to 4:30 p.m. Monday - Friday. Please note that voicemails left after 4:00 p.m. may not be returned until the following  business day.  We are closed weekends and major holidays. You have access to a nurse at all times for urgent questions. Please call the main number to the clinic [phone] and follow the prompts.  For any non-urgent questions, you may also contact your provider using MyChart. We now offer e-Visits for anyone 18 and older to request care online for non-urgent symptoms. For details visit [email or web address].   Also download the MyChart app! Go to the app store, search "MyChart", open the app, select [facility], and log in with your MyChart username and password.  Due to Covid, a mask is required upon entering the hospital/clinic. If you do not have a mask, one will be given to you upon arrival. For doctor visits, patients may have 1 support person aged 18 or older with them. For treatment visits, patients cannot have anyone with them due to current Covid guidelines and our immunocompromised population.  

## 2021-08-19 ENCOUNTER — Inpatient Hospital Stay: Payer: Medicare HMO | Attending: Oncology

## 2021-08-19 VITALS — BP 153/94 | HR 60 | Temp 96.9°F | Resp 18

## 2021-08-19 DIAGNOSIS — D500 Iron deficiency anemia secondary to blood loss (chronic): Secondary | ICD-10-CM | POA: Insufficient documentation

## 2021-08-19 DIAGNOSIS — Z79899 Other long term (current) drug therapy: Secondary | ICD-10-CM | POA: Insufficient documentation

## 2021-08-19 MED ORDER — SODIUM CHLORIDE 0.9 % IV SOLN: 200.0000 mg | INTRAVENOUS | Status: DC

## 2021-08-19 MED ORDER — IRON SUCROSE 20 MG/ML IV SOLN
200.0000 mg | Freq: Once | INTRAVENOUS | Status: AC
  Administered 2021-08-19: 200 mg via INTRAVENOUS
  Filled 2021-08-19: qty 10

## 2021-08-19 MED ORDER — SODIUM CHLORIDE 0.9 % IV SOLN
Freq: Once | INTRAVENOUS | Status: AC
Start: 2021-08-19 — End: 2021-08-19
  Filled 2021-08-19: qty 250

## 2021-08-19 NOTE — Patient Instructions (Signed)
[facility]  Discharge Instructions: Thank you for choosing [facility] to provide your oncology and hematology care.  If you have a lab appointment with the [facility], please go directly to the [facility] and check in at the registration area.  Wear comfortable clothing and clothing appropriate for easy access to any Portacath or PICC line.   We strive to give you quality time with your provider. You may need to reschedule your appointment if you arrive late (15 or more minutes).  Arriving late affects you and other patients whose appointments are after yours.  Also, if you miss three or more appointments without notifying the office, you may be dismissed from the clinic at the provider's discretion.      For prescription refill requests, have your pharmacy contact our office and allow 72 hours for refills to be completed.    Today you received the following chemotherapy and/or immunotherapy agents venofer       To help prevent nausea and vomiting after your treatment, we encourage you to take your nausea medication as directed.  BELOW ARE SYMPTOMS THAT SHOULD BE REPORTED IMMEDIATELY: *FEVER GREATER THAN 100.4 F (38 C) OR HIGHER *CHILLS OR SWEATING *NAUSEA AND VOMITING THAT IS NOT CONTROLLED WITH YOUR NAUSEA MEDICATION *UNUSUAL SHORTNESS OF BREATH *UNUSUAL BRUISING OR BLEEDING *URINARY PROBLEMS (pain or burning when urinating, or frequent urination) *BOWEL PROBLEMS (unusual diarrhea, constipation, pain near the anus) TENDERNESS IN MOUTH AND THROAT WITH OR WITHOUT PRESENCE OF ULCERS (sore throat, sores in mouth, or a toothache) UNUSUAL RASH, SWELLING OR PAIN  UNUSUAL VAGINAL DISCHARGE OR ITCHING   Items with * indicate a potential emergency and should be followed up as soon as possible or go to the Emergency Department if any problems should occur.  Please show the CHEMOTHERAPY ALERT CARD or IMMUNOTHERAPY ALERT CARD at check-in  to the Emergency Department and triage nurse.  Should you have questions after your visit or need to cancel or reschedule your appointment, please contact [facility]  [phone] and follow the prompts.  Office hours are 8:00 a.m. to 4:30 p.m. Monday - Friday. Please note that voicemails left after 4:00 p.m. may not be returned until the following business day.  We are closed weekends and major holidays. You have access to a nurse at all times for urgent questions. Please call the main number to the clinic [phone] and follow the prompts.  For any non-urgent questions, you may also contact your provider using MyChart. We now offer e-Visits for anyone 54 and older to request care online for non-urgent symptoms. For details visit [email or web address].   Also download the MyChart app! Go to the app store, search "MyChart", open the app, select [facility], and log in with your MyChart username and password.  Due to Covid, a mask is required upon entering the hospital/clinic. If you do not have a mask, one will be given to you upon arrival. For doctor visits, patients may have 1 support person aged 69 or older with them. For treatment visits, patients cannot have anyone with them due to current Covid guidelines and our immunocompromised population.   Iron Sucrose Injection What is this medication? IRON SUCROSE (EYE ern SOO krose) treats low levels of iron (iron deficiency anemia) in people with kidney disease. Iron is a mineral that plays an important role in making red blood cells, which carry oxygen from your lungs to the rest of your body. This medicine  may be used for other purposes; ask your health care provider or pharmacist if you have questions. COMMON BRAND NAME(S): Venofer What should I tell my care team before I take this medication? They need to know if you have any of these conditions: Anemia not caused by low iron levels Heart disease High  levels of iron in the blood Kidney disease Liver disease An unusual or allergic reaction to iron, other medications, foods, dyes, or preservatives Pregnant or trying to get pregnant Breast-feeding How should I use this medication? This medication is for infusion into a vein. It is given in a hospital or clinic setting. Talk to your care team about the use of this medication in children. While this medication may be prescribed for children as young as 2 years for selected conditions, precautions do apply. Overdosage: If you think you have taken too much of this medicine contact a poison control center or emergency room at once. NOTE: This medicine is only for you. Do not share this medicine with others. What if I miss a dose? It is important not to miss your dose. Call your care team if you are unable to keep an appointment. What may interact with this medication? Do not take this medication with any of the following: Deferoxamine Dimercaprol Other iron products This medication may also interact with the following: Chloramphenicol Deferasirox This list may not describe all possible interactions. Give your health care provider a list of all the medicines, herbs, non-prescription drugs, or dietary supplements you use. Also tell them if you smoke, drink alcohol, or use illegal drugs. Some items may interact with your medicine. What should I watch for while using this medication? Visit your care team regularly. Tell your care team if your symptoms do not start to get better or if they get worse. You may need blood work done while you are taking this medication. You may need to follow a special diet. Talk to your care team. Foods that contain iron include: whole grains/cereals, dried fruits, beans, or peas, leafy green vegetables, and organ meats (liver, kidney). What side effects may I notice from receiving this medication? Side effects that you should report to your care team as soon as  possible: Allergic reactions-skin rash, itching, hives, swelling of the face, lips, tongue, or throat Low blood pressure-dizziness, feeling faint or lightheaded, blurry vision Shortness of breath Side effects that usually do not require medical attention (report to your care team if they continue or are bothersome): Flushing Headache Joint pain Muscle pain Nausea Pain, redness, or irritation at injection site This list may not describe all possible side effects. Call your doctor for medical advice about side effects. You may report side effects to FDA at 1-800-FDA-1088. Where should I keep my medication? This medication is given in a hospital or clinic and will not be stored at home. NOTE: This sheet is a summary. It may not cover all possible information. If you have questions about this medicine, talk to your doctor, pharmacist, or health care provider.  2022 Elsevier/Gold Standard (2021-03-01 12:52:06)

## 2021-08-24 ENCOUNTER — Inpatient Hospital Stay: Payer: Medicare HMO

## 2021-08-24 VITALS — BP 150/72 | HR 63 | Temp 97.0°F | Resp 16

## 2021-08-24 DIAGNOSIS — D500 Iron deficiency anemia secondary to blood loss (chronic): Secondary | ICD-10-CM

## 2021-08-24 DIAGNOSIS — Z79899 Other long term (current) drug therapy: Secondary | ICD-10-CM | POA: Diagnosis not present

## 2021-08-24 MED ORDER — IRON SUCROSE 20 MG/ML IV SOLN
200.0000 mg | Freq: Once | INTRAVENOUS | Status: AC
  Administered 2021-08-24: 200 mg via INTRAVENOUS
  Filled 2021-08-24: qty 10

## 2021-08-24 MED ORDER — SODIUM CHLORIDE 0.9 % IV SOLN: 200.0000 mg | INTRAVENOUS | Status: DC

## 2021-08-24 MED ORDER — SODIUM CHLORIDE 0.9 % IV SOLN
Freq: Once | INTRAVENOUS | Status: AC
  Filled 2021-08-24: qty 250

## 2021-08-24 NOTE — Patient Instructions (Signed)

## 2021-08-26 ENCOUNTER — Inpatient Hospital Stay: Payer: Medicare HMO

## 2021-08-26 VITALS — BP 146/80 | HR 70

## 2021-08-26 DIAGNOSIS — Z79899 Other long term (current) drug therapy: Secondary | ICD-10-CM | POA: Diagnosis not present

## 2021-08-26 DIAGNOSIS — D500 Iron deficiency anemia secondary to blood loss (chronic): Secondary | ICD-10-CM

## 2021-08-26 MED ORDER — SODIUM CHLORIDE 0.9 % IV SOLN
Freq: Once | INTRAVENOUS | Status: AC
  Filled 2021-08-26: qty 250

## 2021-08-26 MED ORDER — SODIUM CHLORIDE 0.9 % IV SOLN: 200.0000 mg | INTRAVENOUS | Status: DC

## 2021-08-26 MED ORDER — IRON SUCROSE 20 MG/ML IV SOLN
200.0000 mg | Freq: Once | INTRAVENOUS | Status: AC
  Administered 2021-08-26: 200 mg via INTRAVENOUS
  Filled 2021-08-26: qty 10

## 2021-08-30 ENCOUNTER — Other Ambulatory Visit: Payer: Self-pay

## 2021-08-30 ENCOUNTER — Other Ambulatory Visit: Payer: Medicare HMO

## 2021-08-30 DIAGNOSIS — E139 Other specified diabetes mellitus without complications: Secondary | ICD-10-CM

## 2021-08-30 DIAGNOSIS — Q2730 Arteriovenous malformation, site unspecified: Secondary | ICD-10-CM

## 2021-08-30 DIAGNOSIS — D500 Iron deficiency anemia secondary to blood loss (chronic): Secondary | ICD-10-CM

## 2021-08-30 DIAGNOSIS — I4891 Unspecified atrial fibrillation: Secondary | ICD-10-CM | POA: Diagnosis not present

## 2021-08-30 DIAGNOSIS — L989 Disorder of the skin and subcutaneous tissue, unspecified: Secondary | ICD-10-CM | POA: Diagnosis not present

## 2021-08-30 DIAGNOSIS — E1169 Type 2 diabetes mellitus with other specified complication: Secondary | ICD-10-CM

## 2021-08-30 DIAGNOSIS — E785 Hyperlipidemia, unspecified: Secondary | ICD-10-CM

## 2021-08-31 LAB — CBC WITH DIFFERENTIAL/PLATELET
Basophils Absolute: 0 10*3/uL (ref 0.0–0.2)
Basos: 1 %
EOS (ABSOLUTE): 0.1 10*3/uL (ref 0.0–0.4)
Eos: 2 %
Hematocrit: 42.2 % (ref 37.5–51.0)
Hemoglobin: 13.4 g/dL (ref 13.0–17.7)
Immature Grans (Abs): 0 10*3/uL (ref 0.0–0.1)
Immature Granulocytes: 0 %
Lymphocytes Absolute: 2.8 10*3/uL (ref 0.7–3.1)
Lymphs: 42 %
MCH: 26.2 pg — ABNORMAL LOW (ref 26.6–33.0)
MCHC: 31.8 g/dL (ref 31.5–35.7)
MCV: 82 fL (ref 79–97)
Monocytes Absolute: 0.3 10*3/uL (ref 0.1–0.9)
Monocytes: 5 %
Neutrophils Absolute: 3.3 10*3/uL (ref 1.4–7.0)
Neutrophils: 50 %
Platelets: 171 10*3/uL (ref 150–450)
RBC: 5.12 x10E6/uL (ref 4.14–5.80)
RDW: 16.6 % — ABNORMAL HIGH (ref 11.6–15.4)
WBC: 6.6 10*3/uL (ref 3.4–10.8)

## 2021-08-31 LAB — COMPREHENSIVE METABOLIC PANEL
ALT: 17 IU/L (ref 0–44)
AST: 22 IU/L (ref 0–40)
Albumin/Globulin Ratio: 2 (ref 1.2–2.2)
Albumin: 4.6 g/dL (ref 3.7–4.7)
Alkaline Phosphatase: 92 IU/L (ref 44–121)
BUN/Creatinine Ratio: 9 — ABNORMAL LOW (ref 10–24)
BUN: 10 mg/dL (ref 8–27)
Bilirubin Total: 1.2 mg/dL (ref 0.0–1.2)
CO2: 25 mmol/L (ref 20–29)
Calcium: 9.7 mg/dL (ref 8.6–10.2)
Chloride: 103 mmol/L (ref 96–106)
Creatinine, Ser: 1.12 mg/dL (ref 0.76–1.27)
Globulin, Total: 2.3 g/dL (ref 1.5–4.5)
Glucose: 132 mg/dL — ABNORMAL HIGH (ref 65–99)
Potassium: 4.4 mmol/L (ref 3.5–5.2)
Sodium: 143 mmol/L (ref 134–144)
Total Protein: 6.9 g/dL (ref 6.0–8.5)
eGFR: 70 mL/min/{1.73_m2} (ref >59)

## 2021-08-31 LAB — LIPID PANEL
Chol/HDL Ratio: 5.5 ratio — ABNORMAL HIGH (ref 0.0–5.0)
Cholesterol, Total: 164 mg/dL (ref 100–199)
HDL: 30 mg/dL — ABNORMAL LOW (ref >39)
LDL Chol Calc (NIH): 97 mg/dL (ref 0–99)
Triglycerides: 214 mg/dL — ABNORMAL HIGH (ref 0–149)
VLDL Cholesterol Cal: 37 mg/dL (ref 5–40)

## 2021-09-01 ENCOUNTER — Inpatient Hospital Stay: Payer: Medicare HMO

## 2021-09-01 VITALS — BP 128/68 | HR 74 | Temp 97.1°F | Resp 16

## 2021-09-01 DIAGNOSIS — D500 Iron deficiency anemia secondary to blood loss (chronic): Secondary | ICD-10-CM

## 2021-09-01 DIAGNOSIS — Z79899 Other long term (current) drug therapy: Secondary | ICD-10-CM | POA: Diagnosis not present

## 2021-09-01 MED ORDER — SODIUM CHLORIDE 0.9 % IV SOLN: 200.0000 mg | INTRAVENOUS | Status: DC

## 2021-09-01 MED ORDER — IRON SUCROSE 20 MG/ML IV SOLN
200.0000 mg | Freq: Once | INTRAVENOUS | Status: AC
  Administered 2021-09-01: 200 mg via INTRAVENOUS
  Filled 2021-09-01: qty 10

## 2021-09-01 MED ORDER — SODIUM CHLORIDE 0.9 % IV SOLN
Freq: Once | INTRAVENOUS | Status: AC
  Filled 2021-09-01: qty 250

## 2021-09-07 ENCOUNTER — Ambulatory Visit: Payer: Medicare HMO | Admitting: Internal Medicine

## 2021-09-09 ENCOUNTER — Telehealth: Payer: Self-pay | Admitting: Physician Assistant

## 2021-09-09 NOTE — Telephone Encounter (Signed)
Spoke with patient to follow up on request for patient assistance application and the need for 1040, samples, and sign additional form. He stated that he will bring in his 1040 if not today then on Monday. However he will come in to pick up samples today stating that his daughter has his tax forms and may not be able to get those today. He was appreciative for the call with no further questions at this time.   Medication Samples have been placed up front for him to pick up.   Drug name: Xarelto        Strength: 20 mg        Qty: 4 bottles   LOT: [id number]   Exp.Date: 2/25

## 2021-09-12 ENCOUNTER — Ambulatory Visit (INDEPENDENT_AMBULATORY_CARE_PROVIDER_SITE_OTHER): Payer: Medicare HMO | Admitting: Internal Medicine

## 2021-09-12 ENCOUNTER — Other Ambulatory Visit: Payer: Self-pay

## 2021-09-12 ENCOUNTER — Encounter: Payer: Self-pay | Admitting: Internal Medicine

## 2021-09-12 VITALS — BP 157/98 | HR 75 | Temp 98.4°F | Ht 67.99 in | Wt 262.6 lb

## 2021-09-12 DIAGNOSIS — E1169 Type 2 diabetes mellitus with other specified complication: Secondary | ICD-10-CM | POA: Diagnosis not present

## 2021-09-12 DIAGNOSIS — N401 Enlarged prostate with lower urinary tract symptoms: Secondary | ICD-10-CM | POA: Diagnosis not present

## 2021-09-12 DIAGNOSIS — I152 Hypertension secondary to endocrine disorders: Secondary | ICD-10-CM

## 2021-09-12 DIAGNOSIS — E1159 Type 2 diabetes mellitus with other circulatory complications: Secondary | ICD-10-CM

## 2021-09-12 DIAGNOSIS — R7989 Other specified abnormal findings of blood chemistry: Secondary | ICD-10-CM

## 2021-09-12 DIAGNOSIS — L989 Disorder of the skin and subcutaneous tissue, unspecified: Secondary | ICD-10-CM | POA: Diagnosis not present

## 2021-09-12 DIAGNOSIS — E785 Hyperlipidemia, unspecified: Secondary | ICD-10-CM

## 2021-09-12 DIAGNOSIS — D649 Anemia, unspecified: Secondary | ICD-10-CM

## 2021-09-12 DIAGNOSIS — Z23 Encounter for immunization: Secondary | ICD-10-CM

## 2021-09-12 DIAGNOSIS — E119 Type 2 diabetes mellitus without complications: Secondary | ICD-10-CM

## 2021-09-12 LAB — BAYER DCA HB A1C WAIVED: HB A1C (BAYER DCA - WAIVED): 5.7 % — ABNORMAL HIGH (ref 4.8–5.6)

## 2021-09-12 NOTE — Telephone Encounter (Signed)
Received tax forms to be submitted with application and will complete and fax. Then will put in samples closet file.

## 2021-09-12 NOTE — Progress Notes (Signed)
BP (!) 157/98   Pulse 75   Temp 98.4 F (36.9 C) (Oral)   Ht 5' 7.99" (1.727 m)   Wt 262 lb 9.6 oz (119.1 kg)   SpO2 97%   BMI 39.94 kg/m    Subjective:    Patient ID: Ernest [name], male    DOB: March 12, 1951, 70 y.o.   MRN: [id number]  Chief Complaint  Patient presents with   Diabetes   Hypertension   Hyperlipidemia    HPI: Ernest [name] is a 70 y.o. male  Diabetes  Hypertension  Hyperlipidemia   Chief Complaint  Patient presents with   Diabetes   Hypertension   Hyperlipidemia    Relevant past medical, surgical, family and social history reviewed and updated as indicated. Interim medical history since our last visit reviewed. Allergies and medications reviewed and updated.  Review of Systems  Per HPI unless specifically indicated above     Objective:    BP (!) 157/98   Pulse 75   Temp 98.4 F (36.9 C) (Oral)   Ht 5' 7.99" (1.727 m)   Wt 262 lb 9.6 oz (119.1 kg)   SpO2 97%   BMI 39.94 kg/m   Wt Readings from Last 3 Encounters:  09/12/21 262 lb 9.6 oz (119.1 kg)  08/09/21 262 lb (118.8 kg)  08/03/21 259 lb 12.8 oz (117.8 kg)    Physical Exam Vitals and nursing note reviewed.  Constitutional:      General: He is not in acute distress.    Appearance: Normal appearance. He is not ill-appearing or diaphoretic.  HENT:     Head: Normocephalic and atraumatic.     Right Ear: Tympanic membrane and external ear normal. There is no impacted cerumen.     Left Ear: External ear normal.     Nose: No congestion or rhinorrhea.     Mouth/Throat:     Pharynx: No oropharyngeal exudate or posterior oropharyngeal erythema.  Eyes:     Conjunctiva/sclera: Conjunctivae normal.     Pupils: Pupils are equal, round, and reactive to light.  Cardiovascular:     Rate and Rhythm: Normal rate. Rhythm irregular.     Heart sounds: No murmur heard.   No friction rub. No gallop.  Pulmonary:     Effort: No respiratory distress.     Breath sounds: No stridor.  No wheezing or rhonchi.  Chest:     Chest wall: No tenderness.  Abdominal:     General: Abdomen is flat. Bowel sounds are normal.     Palpations: Abdomen is soft. There is no mass.     Tenderness: There is no abdominal tenderness.  Musculoskeletal:        General: No swelling, tenderness or signs of injury.     Cervical back: Normal range of motion and neck supple. No rigidity or tenderness.     Left lower leg: No edema.  Skin:    General: Skin is warm and dry.  Neurological:     Mental Status: He is alert.  Psychiatric:        Mood and Affect: Mood normal.    Results for orders placed or performed in visit on 08/30/21  Lipid panel  Result Value Ref Range   Cholesterol, Total 164 100 - 199 mg/dL   Triglycerides 214 (H) 0 - 149 mg/dL   HDL 30 (L) >39 mg/dL   VLDL Cholesterol Cal 37 5 - 40 mg/dL   LDL Chol Calc (NIH) 97 0 - 99 mg/dL  Chol/HDL Ratio 5.5 (H) 0.0 - 5.0 ratio  CBC with Differential/Platelet  Result Value Ref Range   WBC 6.6 3.4 - 10.8 x10E3/uL   RBC 5.12 4.14 - 5.80 x10E6/uL   Hemoglobin 13.4 13.0 - 17.7 g/dL   Hematocrit 42.2 37.5 - 51.0 %   MCV 82 79 - 97 fL   MCH 26.2 (L) 26.6 - 33.0 pg   MCHC 31.8 31.5 - 35.7 g/dL   RDW 16.6 (H) 11.6 - 15.4 %   Platelets 171 150 - 450 x10E3/uL   Neutrophils 50 Not Estab. %   Lymphs 42 Not Estab. %   Monocytes 5 Not Estab. %   Eos 2 Not Estab. %   Basos 1 Not Estab. %   Neutrophils Absolute 3.3 1.4 - 7.0 x10E3/uL   Lymphocytes Absolute 2.8 0.7 - 3.1 x10E3/uL   Monocytes Absolute 0.3 0.1 - 0.9 x10E3/uL   EOS (ABSOLUTE) 0.1 0.0 - 0.4 x10E3/uL   Basophils Absolute 0.0 0.0 - 0.2 x10E3/uL   Immature Granulocytes 0 Not Estab. %   Immature Grans (Abs) 0.0 0.0 - 0.1 x10E3/uL  Comprehensive metabolic panel  Result Value Ref Range   Glucose 132 (H) 65 - 99 mg/dL   BUN 10 8 - 27 mg/dL   Creatinine, Ser 1.12 0.76 - 1.27 mg/dL   eGFR 70 >59 mL/min/1.73   BUN/Creatinine Ratio 9 (L) 10 - 24   Sodium 143 134 - 144 mmol/L    Potassium 4.4 3.5 - 5.2 mmol/L   Chloride 103 96 - 106 mmol/L   CO2 25 20 - 29 mmol/L   Calcium 9.7 8.6 - 10.2 mg/dL   Total Protein 6.9 6.0 - 8.5 g/dL   Albumin 4.6 3.7 - 4.7 g/dL   Globulin, Total 2.3 1.5 - 4.5 g/dL   Albumin/Globulin Ratio 2.0 1.2 - 2.2   Bilirubin Total 1.2 0.0 - 1.2 mg/dL   Alkaline Phosphatase 92 44 - 121 IU/L   AST 22 0 - 40 IU/L   ALT 17 0 - 44 IU/L        Current Outpatient Medications:    ACCU-CHEK GUIDE test strip, , Disp: , Rfl:    albuterol (VENTOLIN HFA) 108 (90 Base) MCG/ACT inhaler, Inhale 2 puffs into the lungs every 4 (four) hours as needed for wheezing or shortness of breath. (Patient taking differently: Inhale 2 puffs into the lungs every 4 (four) hours as needed for wheezing or shortness of breath. Uses 2-3 times daily), Disp: 18 g, Rfl: 6   atorvastatin (LIPITOR) 80 MG tablet, TAKE 1 TABLET(80 MG) BY MOUTH DAILY, Disp: 90 tablet, Rfl: 1   clotrimazole-betamethasone (LOTRISONE) cream, Apply 1 application topically 2 (two) times daily., Disp: 30 g, Rfl: 2   dapagliflozin propanediol (FARXIGA) 10 MG TABS tablet, Take 1 tablet (10 mg total) by mouth daily., Disp: 30 tablet, Rfl: 6   Fluticasone-Salmeterol (ADVAIR) 100-50 MCG/DOSE AEPB, Inhale 1 puff into the lungs 2 (two) times daily., Disp: 1 each, Rfl: 3   furosemide (LASIX) 40 MG tablet, Take 0.5 tablets (20 mg total) by mouth 2 (two) times daily., Disp: 180 tablet, Rfl: 1   gabapentin (NEURONTIN) 600 MG tablet, TAKE 2 TABLETS(1200 MG) BY MOUTH AT BEDTIME, Disp: 180 tablet, Rfl: 0   isosorbide mononitrate (IMDUR) 60 MG 24 hr tablet, TAKE 1 TABLET(60 MG) BY MOUTH DAILY, Disp: 30 tablet, Rfl: 3   metFORMIN (GLUCOPHAGE-XR) 500 MG 24 hr tablet, Take 1 tablet (500 mg total) by mouth in the morning and at bedtime. 1  tab bid, Disp: 60 tablet, Rfl: 5   metoprolol succinate (TOPROL-XL) 50 MG 24 hr tablet, Take 50 mg by mouth daily., Disp: , Rfl:    nitroGLYCERIN (NITROSTAT) 0.4 MG SL tablet, Place 1 tablet  (0.4 mg total) under the tongue every 5 (five) minutes as needed for chest pain., Disp: 25 tablet, Rfl: 2   pantoprazole (PROTONIX) 40 MG tablet, TAKE 1 TABLET(40 MG) BY MOUTH DAILY, Disp: 90 tablet, Rfl: 2   ramipril (ALTACE) 10 MG capsule, TAKE 1 CAPSULE(10 MG) BY MOUTH DAILY, Disp: 90 capsule, Rfl: 0   XARELTO 20 MG TABS tablet, TAKE 1 TABLET(20 MG) BY MOUTH DAILY WITH SUPPER, Disp: 90 tablet, Rfl: 0   carvedilol (COREG) 25 MG tablet, Take 1 tablet (25 mg total) by mouth 2 (two) times daily., Disp: 180 tablet, Rfl: 3   diltiazem (CARDIZEM CD) 180 MG 24 hr capsule, Take 1 capsule (180 mg total) by mouth daily., Disp: 90 capsule, Rfl: 3    Assessment & Plan:1  DM :   Is on farxiga, metfomrin for such check urine  microalbumin  diabetic diet plan given to pt  adviced regarding hypoglycemia and instructions given to pt today on how to prevent and treat the same if it were to occur. pt acknowledges the plan and voices understanding of the same.  exercise plan given and encouraged.   advice diabetic yearly podiatry, ophthalmology , nutritionist , dental check q 6 months     3. Afib is on xarelto for such fu and mx per cards.  Is on cardizem, ramipril coreg and imdur    4. HLD : LDL at goal.  recheck FLP, check LFT's work on diet, SE of meds explained to pt. low fat and high fiber diet explained to pt.   Ref. Range 08/30/2021 10:11  Total CHOL/HDL Ratio Latest Ref Range: 0.0 - 5.0 ratio 5.5 (H)  Cholesterol, Total Latest Ref Range: 100 - 199 mg/dL 164  HDL Cholesterol Latest Ref Range: >39 mg/dL 30 (L)  Triglycerides Latest Ref Range: 0 - 149 mg/dL 214 (H)  VLDL Cholesterol Cal Latest Ref Range: 5 - 40 mg/dL 37  LDL Chol Calc (NIH) Latest Ref Range: 0 - 99 mg/dL 97   5. Mulitple skin lesions Will refer to derm. pt was referred last visit didn't see them.   Problem List Items Addressed This Visit   None Visit Diagnoses     Need for influenza vaccination    -  Primary   Relevant Orders    Flu Vaccine QUAD High Dose(Fluad) (Completed)        Orders Placed This Encounter  Procedures   Flu Vaccine QUAD High Dose(Fluad)     No orders of the defined types were placed in this encounter.    Follow up plan: No follow-ups on file.   Labs next visit : CBC, CMP, FLP, HBA1C, TSH, PSA, urine microalbumin Labs 1 week prior to next visit.

## 2021-09-12 NOTE — Telephone Encounter (Signed)
Patient dropped off 1040 tax form, placed in box.

## 2021-09-21 ENCOUNTER — Telehealth: Payer: Self-pay

## 2021-09-21 NOTE — Telephone Encounter (Signed)
Incoming fax from [employer]  PA program NOT able to provided assistance for Xarelto.   "After carefully reviewing the application that was submitted, we have determine that Ernest [name] does not meet the Programs' eligibility requirements at this time".  Record ID [id number]  Will route to [facility], RN as she stated the application process.   Denial letter placed in file cabinet.

## 2021-09-29 ENCOUNTER — Other Ambulatory Visit: Payer: Self-pay | Admitting: Family Medicine

## 2021-09-29 DIAGNOSIS — G2581 Restless legs syndrome: Secondary | ICD-10-CM

## 2021-09-29 NOTE — Telephone Encounter (Signed)
Requested Prescriptions  Pending Prescriptions Disp Refills  . gabapentin (NEURONTIN) 600 MG tablet [Pharmacy Med Name: GABAPENTIN 600MG  TABLETS] 180 tablet 0    Sig: TAKE 2 TABLETS BY MOUTH AT BEDTIME     Neurology: Anticonvulsants - gabapentin Passed - 09/29/2021  3:11 AM      Passed - Valid encounter within last 12 months    Recent Outpatient Visits          2 weeks ago Need for influenza vaccination   [facility] [name], [name], MD   3 months ago Hyperlipidemia, unspecified hyperlipidemia type   [facility] [name], [name], MD   4 months ago Hypertension associated with diabetes ([place])   [facility] [name], [name], MD   6 months ago Anemia, unspecified type   [place] [name], [name], MD   6 months ago Restless legs   [place] [name], [name], MD      Future Appointments            In 2 months Ralene Bathe, MD [place]   In 2 months [name], [name], MD [facility], [place]   In 4 months  [employer], [place]

## 2021-09-29 NOTE — Telephone Encounter (Signed)
I called and spoke with the patient and notified him that we received a letter of denial for his Xarelto patient assistance.  The patient advised he had not received this letter.  I inquired how much Xarelto he currently has and he advised ~ 4 days worth. He states he is currently in the donut hole and his Xarelto RX is ~ $400/ bottle.  I have advised him of the [employer] that he may be able to pay $85/ month supply. He is aware he will need to register online/ call to inquire further on this. The patient states he does not have computer access.  I have given him the # to call 888-XARELTO [phone]). I have asked that he call them today and advise he is currently in the donut hole and does not qualify for patient assistance.   He is advised to call back if he has any issues when calling the above #.   The patient voices understanding and is agreeable.

## 2021-10-04 ENCOUNTER — Telehealth: Payer: Self-pay | Admitting: Internal Medicine

## 2021-10-04 NOTE — Telephone Encounter (Signed)
Pt came in to the office stating that he has changed pharmacies and would like to change from [facility] to [facility] [address] in [place], [name] and would like to see if his gabapentin (NEURONTIN) 600 MG could be sent to the new pharmacy.  **NEW PHARMACY**

## 2021-10-10 NOTE — Telephone Encounter (Signed)
Left voicemail message to call back for review of information.  Pharmacist reviewed chart and patient should not be taking plavix. Patient had previously been on plavix but was discontinued in 1 year post stent placement.   He needs xarelto for his afib to provide stroke prevention. She shared that patient can call [facility] and would be able to get medication for $85.00 per month.

## 2021-10-10 NOTE — Telephone Encounter (Signed)
Plavix is not a substitute for Xarelto, they are used for different indications. It sounds like he just wanted to take something when he ran out of Xarelto. He needs to resume Xarelto, he does not currently have Plavix on his med list (this was stopped 1 year after his stent was placed) and Plavix will not protect him from a stroke related to afib like his Xarelto will.  Would provide him with a week of samples and make sure he calls [employer] today, they approve the patients quickly for the program that brings the copay down to $85 a month. Once he's enrolled, the office needs to send a Xarelto prescription to [place] who mails the med to the pt.

## 2021-10-10 NOTE — Telephone Encounter (Signed)
Spoke with patient about denial for assistance with xarelto. He reports that he took samples and once he ran out he went back to plavix. I do not see where this was changed back. Chart reviewed and unable to find this change. Patient chart mentions plavix, xarelto, and unsure which he should be taking. Will see if pharmacy can review and assist with this clarification.

## 2021-10-11 NOTE — Telephone Encounter (Signed)
Spoke with patient and reviewed that he should not be on plavix and that does not protect him from stroke. He states that he was able to call [employer] and is now getting xarelto for $85 per month. Advised that if he should have any further needs to please call us back and we would do our best to help. He was appreciative for the call with no further questions at this time.

## 2021-11-06 NOTE — Progress Notes (Signed)
Date:  11/07/2021   ID:  Ernest [name], DOB 1951-08-03, MRN [id number]  Patient Location:  [address] [place] [address]   Provider location:   [facility], [place] office  PCP:  [name], MD  Cardiologist:  None   Chief Complaint  Patient presents with   6 month follow up     Patient c/o dizziness, had a nosebleed two days ago, weakness, shortness of breath and chest pain with little activity. Medications reviewed by the patient verbally.      History of Present Illness:    Ernest [name] is a 70 y.o. male past medical history of permanent atrial fibrillation,  coronary artery disease,PCI to LAD 03/07/19 hypertension,  hyperlipidemia,  diabetes mellitus,  chronic kidney disease stage III,  obstruct sleep apnea,  pericardial calcification on chest CT who presents for Follow-up of his persistent atrial fibrillation, hypertension, cad  LOV in the office 03/2017 Retired, used to drive a Psychologist, forensic truck,  Has a scooter, goes around town Travelling around on his scooter to [employer]  Wife passed 10 yrs ago, PNA/DM Daughter passed 4 yrs ago from [employer] on his own  New cardiac changes, taking NTG for chest pain 3-4 x past few months, concerned about his chest pain symptoms concerning for angina  EKG personally reviewed by myself on todays visit Atrial fibrillation 63 bpm no St or T wave changes  Other past medical hx reviewed  Chest pain, NSTEMI  03/06/2019 TNT 0.37 Catheterization detailed below  Echo 03/07/2019 The left ventricle has normal systolic function, with an ejection fraction of 55-60%. The cavity size was normal. There is moderately increased left ventricular wall thickness. Left ventricular diastolic function could not be evaluated secondary to  atrial fibrillation.  2. The right ventricle has normal systolic function. The cavity was normal. There is no increase in right ventricular wall thickness.  3.  Left atrial size was mild-moderately dilated.  Cath 03/07/19 Severe single-vessel CAD with 80% mid LAD stenosis. Non-obstructive LMCA, LCx, and RCA disease. Upper normal left ventricular filling pressure with normal ejection fraction. Mild to moderate mitral regurgitation in the setting of catheter-induced non-sustained ventricular tachycardia. Successful PCI to mid LAD using Synergy 2.5 x 24 mm drug-eluting stent (post-dilated to 2.8 mm) with 0% residual stenosis and TIMI-3 flow.    Past Medical History:  Diagnosis Date   A-fib ([place])    Anemia    Asthma    COPD (chronic obstructive pulmonary disease) ([place])    Diabetes mellitus    Type II   Early Pulmonary fibrosis (HCC)    GERD (gastroesophageal reflux disease)    History of echocardiogram    a. 02/2017 Echo: EF 60-65%, no rwma, mild MR, mod dil LA. Nl RV fxn. PASP 81mmHg.   Hyperlipidemia    Hypertension    Morbid obesity ([place])    Myocardial infarction [facility])    "light"   Non-obstructive CAD (coronary artery disease)    a. 2011 Cath: nonobs dzs; b. 07/2014 Cath: LM 30, LAD nl, LCX nl, RCA 20p, 61m.   Permanent atrial fibrillation (HCC)    a. CHA2DS2VASc = 4-->xarelto.   Polyp of sigmoid colon    Past Surgical History:  Procedure Laterality Date   ANKLE SURGERY     CARDIAC CATHETERIZATION  05/2010   [facility]   CARDIAC CATHETERIZATION     [facility]   CARDIAC CATHETERIZATION     [facility]   CARDIAC CATHETERIZATION  07/2014   [facility]   COLONOSCOPY     COLONOSCOPY WITH PROPOFOL N/A 06/26/2018   Procedure: COLONOSCOPY WITH PROPOFOL;  Surgeon: Virgel Manifold, MD;  Location: [facility] ENDOSCOPY;  Service: Endoscopy;  Laterality: N/A;   COLONOSCOPY WITH PROPOFOL N/A 05/23/2021   Procedure: COLONOSCOPY WITH PROPOFOL;  Surgeon: Virgel Manifold, MD;  Location: [facility] ENDOSCOPY;  Service: Endoscopy;  Laterality: N/A;   CORONARY ARTERY BYPASS GRAFT     CORONARY STENT INTERVENTION N/A 03/07/2019   Procedure: CORONARY STENT INTERVENTION;  Surgeon: Nelva Bush, MD;  Location: [place] [facility];  Service: Cardiovascular;  Laterality: N/A;   ENTEROSCOPY N/A 08/05/2018   Procedure: ENTEROSCOPY;  Surgeon: Virgel Manifold, MD;  Location: [facility];  Service: Endoscopy;  Laterality: N/A;   ESOPHAGOGASTRODUODENOSCOPY (EGD) WITH PROPOFOL N/A 06/26/2018   Procedure: ESOPHAGOGASTRODUODENOSCOPY (EGD) WITH PROPOFOL;  Surgeon: Virgel Manifold, MD;  Location: [facility];  Service: Endoscopy;  Laterality: N/A;   ESOPHAGOGASTRODUODENOSCOPY (EGD) WITH PROPOFOL N/A 05/23/2021   Procedure: PUSH ESOPHAGOGASTRODUODENOSCOPY (EGD) WITH PROPOFOL;  Surgeon: Virgel Manifold, MD;  Location: [facility] END[facility];  Service: Endoscopy;  Laterality: N/A;   LEFT HEART CATH AND CORONARY ANGIOGRAPHY N/A 03/07/2019   Procedure: LEFT HEART CATH AND CORONARY ANGIOGRAPHY;  Surgeon: Nelva Bush, MD;  Location: [place] [facility];  Service: Cardiovascular;  Laterality: N/A;   TESTICLE SURGERY  70's   TUMOR EXCISION     Neck and finger; benign   WRIST SURGERY       Current Meds  Medication Sig   ACCU-CHEK GUIDE test strip    albuterol (VENTOLIN HFA) 108 (90 Base) MCG/ACT inhaler Inhale 2 puffs into the lungs every 4 (four) hours as needed for wheezing or shortness of breath. (Patient taking differently: Inhale 2 puffs into the lungs every 4 (four) hours as needed for wheezing or shortness of breath. Uses 2-3 times daily)   atorvastatin (LIPITOR) 80 MG tablet TAKE 1 TABLET(80 MG) BY MOUTH DAILY   carvedilol (COREG) 25 MG tablet Take 1 tablet (25 mg total) by mouth 2 (two) times daily.   clotrimazole-betamethasone (LOTRISONE) cream Apply 1 application topically 2 (two) times daily.   dapagliflozin propanediol (FARXIGA) 10 MG TABS tablet Take 1 tablet (10 mg total) by mouth daily.   diltiazem (CARDIZEM CD) 180 MG 24 hr capsule Take 1 capsule (180 mg total) by mouth daily.   Fluticasone-Salmeterol (ADVAIR) 100-50 MCG/DOSE AEPB Inhale 1 puff into the lungs 2  (two) times daily.   furosemide (LASIX) 40 MG tablet Take 0.5 tablets (20 mg total) by mouth 2 (two) times daily.   gabapentin (NEURONTIN) 600 MG tablet TAKE 2 TABLETS BY MOUTH AT BEDTIME   isosorbide mononitrate (IMDUR) 60 MG 24 hr tablet TAKE 1 TABLET(60 MG) BY MOUTH DAILY   metFORMIN (GLUCOPHAGE-XR) 500 MG 24 hr tablet Take 1 tablet (500 mg total) by mouth in the morning and at bedtime. 1 tab bid   metoprolol succinate (TOPROL-XL) 50 MG 24 hr tablet Take 50 mg by mouth daily.   nitroGLYCERIN (NITROSTAT) 0.4 MG SL tablet Place 1 tablet (0.4 mg total) under the tongue every 5 (five) minutes as needed for chest pain.   pantoprazole (PROTONIX) 40 MG tablet TAKE 1 TABLET(40 MG) BY MOUTH DAILY   ramipril (ALTACE) 10 MG capsule TAKE 1 CAPSULE(10 MG) BY MOUTH DAILY   XARELTO 20 MG TABS tablet TAKE 1 TABLET(20 MG) BY MOUTH DAILY WITH SUPPER     Allergies:   Doxycycline   Social History  Tobacco Use   Smoking status: Former    Packs/day: 4.00    Years: 28.00    Pack years: 112.00    Types: Cigarettes    Quit date: 08/25/1993    Years since quitting: 28.2   Smokeless tobacco: Never  Vaping Use   Vaping Use: Never used  Substance Use Topics   Alcohol use: Not Currently   Drug use: No     Family Hx: The patient's family history includes Alcohol abuse in an other family member; Cancer in his brother; Coronary artery disease in an other family member; Depression in an other family member; Diabetes in his mother and another family member; Heart disease in his mother; Hyperlipidemia in his mother and another family member; Hypertension in his mother and another family member; Kidney Stones in his brother; Pneumonia in his father.  ROS:   Please see the history of present illness.    Review of Systems  Constitutional: Negative.   Respiratory: Negative.    Cardiovascular: Negative.   Gastrointestinal: Negative.   Musculoskeletal: Negative.   Neurological: Negative.    Psychiatric/Behavioral: Negative.    All other systems reviewed and are negative.   Labs/Other Tests and Data Reviewed:    Recent Labs: 05/31/2021: TSH 2.290 08/30/2021: ALT 17; BUN 10; Creatinine, Ser 1.12; Hemoglobin 13.4; Platelets 171; Potassium 4.4; Sodium 143   Recent Lipid Panel Lab Results  Component Value Date/Time   CHOL 164 08/30/2021 10:11 AM   CHOL 195 08/01/2017 09:02 AM   CHOL 104 07/25/2014 04:25 AM   TRIG 214 (H) 08/30/2021 10:11 AM   TRIG 268 (H) 08/01/2017 09:02 AM   TRIG 243 (H) 07/25/2014 04:25 AM   TRIG 249 05/27/2010 12:00 AM   HDL 30 (L) 08/30/2021 10:11 AM   HDL 25 (L) 07/25/2014 04:25 AM   CHOLHDL 5.5 (H) 08/30/2021 10:11 AM   LDLCALC 97 08/30/2021 10:11 AM   LDLCALC 30 07/25/2014 04:25 AM    Wt Readings from Last 3 Encounters:  11/07/21 253 lb 6 oz (114.9 kg)  09/12/21 262 lb 9.6 oz (119.1 kg)  08/09/21 262 lb (118.8 kg)     Exam:    Vital Signs:  BP 138/64 (BP Location: Left Arm, Patient Position: Sitting, Cuff Size: Normal)   Pulse 63   Ht 5\' 8"  (1.727 m)   Wt 253 lb 6 oz (114.9 kg)   SpO2 97%   BMI 38.53 kg/m   Constitutional:  oriented to person, place, and time. No distress.  HENT:  Head: Grossly normal Eyes:  no discharge. No scleral icterus.  Neck: No JVD, no carotid bruits  Cardiovascular: Regular rate and rhythm, no murmurs appreciated Pulmonary/Chest: Clear to auscultation bilaterally, no wheezes or rails Abdominal: Soft.  no distension.  no tenderness.  Musculoskeletal: Normal range of motion Neurological:  normal muscle tone. Coordination normal. 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, [place], [place] [address]

## 2021-11-07 ENCOUNTER — Ambulatory Visit: Payer: Medicare HMO | Admitting: Cardiovascular Disease

## 2021-11-07 ENCOUNTER — Other Ambulatory Visit: Payer: Self-pay

## 2021-11-07 ENCOUNTER — Encounter: Payer: Self-pay | Admitting: Cardiovascular Disease

## 2021-11-07 VITALS — BP 138/64 | HR 63 | Ht 68.0 in | Wt 253.4 lb

## 2021-11-07 DIAGNOSIS — R0602 Shortness of breath: Secondary | ICD-10-CM | POA: Diagnosis not present

## 2021-11-07 DIAGNOSIS — I5032 Chronic diastolic (congestive) heart failure: Secondary | ICD-10-CM

## 2021-11-07 DIAGNOSIS — I25119 Atherosclerotic heart disease of native coronary artery with unspecified angina pectoris: Secondary | ICD-10-CM | POA: Diagnosis not present

## 2021-11-07 DIAGNOSIS — E782 Mixed hyperlipidemia: Secondary | ICD-10-CM | POA: Diagnosis not present

## 2021-11-07 DIAGNOSIS — N183 Chronic kidney disease, stage 3 unspecified: Secondary | ICD-10-CM | POA: Diagnosis not present

## 2021-11-07 DIAGNOSIS — E118 Type 2 diabetes mellitus with unspecified complications: Secondary | ICD-10-CM | POA: Diagnosis not present

## 2021-11-07 DIAGNOSIS — I208 Other forms of angina pectoris: Secondary | ICD-10-CM | POA: Diagnosis not present

## 2021-11-07 DIAGNOSIS — I2089 Other forms of angina pectoris: Secondary | ICD-10-CM

## 2021-11-07 DIAGNOSIS — I10 Essential (primary) hypertension: Secondary | ICD-10-CM

## 2021-11-07 MED ORDER — EZETIMIBE 10 MG PO TABS: 10.0000 mg | ORAL_TABLET | Freq: Every day | ORAL | 3 refills | Status: DC

## 2021-11-07 NOTE — Patient Instructions (Addendum)
Ernest [name], xarelto [phone]  Medication Instructions:  Please start  zetia 10 mg daily  for cholesterol  If you need a refill on your cardiac medications before your next appointment, please call your pharmacy.   Lab work: No new labs needed  Testing/Procedures: Tax inspector (stress test)  Follow-Up: At [facility], you and your health needs are our priority.  As part of our continuing mission to provide you with exceptional heart care, we have created designated Provider Care Teams.  These Care Teams include your primary Cardiologist (physician) and Advanced Practice Providers (APPs -  Physician Assistants and Nurse Practitioners) who all work together to provide you with the care you need, when you need it.  You will need a follow up appointment in 12 months  Providers on your designated Care Team:   Murray Hodgkins, NP Christell Faith, PA-C Cadence Kathlen Mody, [place]  COVID-19 Vaccine Information can be found at: [email or web address] For questions related to vaccine distribution or appointments, please email vaccine@[email or web address] or call [phone].   [facility] MYOVIEW Veterinary surgeon)  Your caregiver has ordered a Stress Test with nuclear imaging. The purpose of this test is to evaluate the blood supply to your heart muscle. This procedure is referred to as a "Non-Invasive Stress Test." This is because other than having an IV started in your vein, nothing is inserted or "invades" your body. Cardiac stress tests are done to find areas of poor blood flow to the heart by determining the extent of coronary artery disease (CAD). Some patients exercise on a treadmill, which naturally increases the blood flow to your heart, while others who are  unable to walk on a treadmill due to physical limitations have a pharmacologic/chemical stress agent called Lexiscan . This medicine will mimic walking on a treadmill by temporarily increasing your coronary blood  flow.   Please note: these test may take anywhere between 2-4 hours to complete  PLEASE REPORT TO [facility] WILL DIRECT YOU WHERE TO GO  Instructions regarding medication:   _X__ : Hold diabetes medication morning of procedure Hold metformin 24 hrs prior to procedure and 48 hours after the procedure. May take metformin after the 48 hours.   _X__:  Hold betablocker(s) night before procedure and morning of procedure Carvedilol metoprolol  _X__:  Hold other medications as follows: Lasix  How to prepare for your Myoview test:  Do not eat or drink after midnight No caffeine for 24 hours prior to test No smoking 24 hours prior to test. ALL your medication may be taken with a few sips of water.   (Except for the meds mention above) Please wear a short sleeve shirt. Comfortable pants are appropriate. No perfume, cologne or lotion.  PLEASE NOTIFY THE OFFICE AT LEAST 35 HOURS IN ADVANCE IF YOU ARE UNABLE TO KEEP YOUR APPOINTMENT.  [phone] AND  PLEASE NOTIFY NUCLEAR MEDICINE AT [facility] AT LEAST 24 HOURS IN ADVANCE IF YOU ARE UNABLE TO KEEP YOUR APPOINTMENT. [phone]

## 2021-11-11 ENCOUNTER — Other Ambulatory Visit: Payer: Medicare HMO

## 2021-11-14 ENCOUNTER — Other Ambulatory Visit: Payer: Self-pay

## 2021-11-14 ENCOUNTER — Inpatient Hospital Stay: Payer: Medicare HMO | Attending: Oncology

## 2021-11-14 DIAGNOSIS — D500 Iron deficiency anemia secondary to blood loss (chronic): Secondary | ICD-10-CM | POA: Diagnosis present

## 2021-11-14 DIAGNOSIS — Z8639 Personal history of other endocrine, nutritional and metabolic disease: Secondary | ICD-10-CM | POA: Diagnosis not present

## 2021-11-14 LAB — CBC WITH DIFFERENTIAL/PLATELET
Abs Immature Granulocytes: 0.01 10*3/uL (ref 0.00–0.07)
Basophils Absolute: 0.1 10*3/uL (ref 0.0–0.1)
Basophils Relative: 1 %
Eosinophils Absolute: 0.2 10*3/uL (ref 0.0–0.5)
Eosinophils Relative: 2 %
HCT: 41.6 % (ref 39.0–52.0)
Hemoglobin: 13.5 g/dL (ref 13.0–17.0)
Immature Granulocytes: 0 %
Lymphocytes Relative: 35 %
Lymphs Abs: 2.6 10*3/uL (ref 0.7–4.0)
MCH: 27.2 pg (ref 26.0–34.0)
MCHC: 32.5 g/dL (ref 30.0–36.0)
MCV: 83.9 fL (ref 80.0–100.0)
Monocytes Absolute: 0.5 10*3/uL (ref 0.1–1.0)
Monocytes Relative: 7 %
Neutro Abs: 4.1 10*3/uL (ref 1.7–7.7)
Neutrophils Relative %: 55 %
Platelets: 170 10*3/uL (ref 150–400)
RBC: 4.96 MIL/uL (ref 4.22–5.81)
RDW: 14.7 % (ref 11.5–15.5)
WBC: 7.4 10*3/uL (ref 4.0–10.5)
nRBC: 0 % (ref 0.0–0.2)

## 2021-11-14 LAB — IRON AND TIBC
Iron: 69 ug/dL (ref 45–182)
Saturation Ratios: 16 % — ABNORMAL LOW (ref 17.9–39.5)
TIBC: 444 ug/dL (ref 250–450)
UIBC: 375 ug/dL

## 2021-11-14 LAB — VITAMIN B12: Vitamin B-12: 268 pg/mL (ref 180–914)

## 2021-11-14 LAB — FERRITIN: Ferritin: 107 ng/mL (ref 24–336)

## 2021-11-22 ENCOUNTER — Other Ambulatory Visit: Payer: Self-pay

## 2021-11-22 ENCOUNTER — Ambulatory Visit
Admission: RE | Admit: 2021-11-22 | Discharge: 2021-11-22 | Disposition: A | Discharge status: Home / Self Care | Payer: Medicare HMO | Source: Ambulatory Visit | Attending: Cardiovascular Disease | Admitting: Cardiovascular Disease

## 2021-11-22 DIAGNOSIS — I208 Other forms of angina pectoris: Secondary | ICD-10-CM | POA: Diagnosis not present

## 2021-11-22 DIAGNOSIS — I25119 Atherosclerotic heart disease of native coronary artery with unspecified angina pectoris: Secondary | ICD-10-CM | POA: Insufficient documentation

## 2021-11-22 DIAGNOSIS — I10 Essential (primary) hypertension: Secondary | ICD-10-CM | POA: Insufficient documentation

## 2021-11-22 LAB — NM MYOCAR MULTI W/SPECT W/WALL MOTION / EF
Base ST Depression (mm): 0 mm
LV dias vol: 80 mL (ref 62–150)
LV sys vol: 22 mL
Nuc Stress EF: 73 %
Peak HR: 96 {beats}/min
Percent HR: 64 %
Rest HR: 91 {beats}/min
Rest Nuclear Isotope Dose: 10.6 mCi
SDS: 0
SRS: 0
SSS: 0
Stress Nuclear Isotope Dose: 30.6 mCi
TID: 1.02

## 2021-11-22 MED ORDER — REGADENOSON 0.4 MG/5ML IV SOLN
0.4000 mg | Freq: Once | INTRAVENOUS | Status: AC
  Administered 2021-11-22: 0.4 mg via INTRAVENOUS
  Filled 2021-11-22: qty 5

## 2021-11-22 MED ORDER — TECHNETIUM TC 99M TETROFOSMIN IV KIT
30.0000 | PACK | Freq: Once | INTRAVENOUS | Status: AC | PRN
  Administered 2021-11-22: 30.62 via INTRAVENOUS

## 2021-11-22 MED ORDER — TECHNETIUM TC 99M TETROFOSMIN IV KIT
10.0000 | PACK | Freq: Once | INTRAVENOUS | Status: AC | PRN
  Administered 2021-11-22: 10.6 via INTRAVENOUS

## 2021-11-24 ENCOUNTER — Encounter: Admission: RE | Admit: 2021-11-24 | Payer: Medicare HMO | Source: Ambulatory Visit

## 2021-11-25 ENCOUNTER — Telehealth: Payer: Self-pay

## 2021-11-25 NOTE — Chronic Care Management (AMB) (Signed)
Chronic Care Management Pharmacy Assistant   Name: Breon Rehm  MRN: [id number] DOB: [birth year]   Reason for Encounter: Disease State General  Recent office visits:  09/12/21-[name],MD (PCP) Seen for a general follow up visit. Flu vaccine given. Return in about 3 months. 06/07/21-[name], MD (PCP) Seen for Anemia. Ambulatory referral to Dermatology. Return in about 3 months.  Recent consult visits:  11/07/21-Timothy Gloriajean Dell, MD (Cardiology) Seen for 6 month follow up. Start Zetia 10 mg daily for cholesterol. 08/09/21-Archana C. Rao, MD (Oncology)  Routine f/u of iron deficiency anemia. 08/03/21-Varnita B. Tahiliani (Gastroenterology) Seen for Anemia. Follow up in 6 months. Marland Kitchen Hospital visits:  None in previous 6 months  Medications: Outpatient Encounter Medications as of 11/25/2021  Medication Sig   ACCU-CHEK GUIDE test strip    albuterol (VENTOLIN HFA) 108 (90 Base) MCG/ACT inhaler Inhale 2 puffs into the lungs every 4 (four) hours as needed for wheezing or shortness of breath. (Patient taking differently: Inhale 2 puffs into the lungs every 4 (four) hours as needed for wheezing or shortness of breath. Uses 2-3 times daily)   atorvastatin (LIPITOR) 80 MG tablet TAKE 1 TABLET(80 MG) BY MOUTH DAILY   carvedilol (COREG) 25 MG tablet Take 1 tablet (25 mg total) by mouth 2 (two) times daily.   clotrimazole-betamethasone (LOTRISONE) cream Apply 1 application topically 2 (two) times daily.   dapagliflozin propanediol (FARXIGA) 10 MG TABS tablet Take 1 tablet (10 mg total) by mouth daily.   diltiazem (CARDIZEM CD) 180 MG 24 hr capsule Take 1 capsule (180 mg total) by mouth daily.   ezetimibe (ZETIA) 10 MG tablet Take 1 tablet (10 mg total) by mouth daily.   Fluticasone-Salmeterol (ADVAIR) 100-50 MCG/DOSE AEPB Inhale 1 puff into the lungs 2 (two) times daily.   furosemide (LASIX) 40 MG tablet Take 0.5 tablets (20 mg total) by mouth 2 (two) times daily.   gabapentin  (NEURONTIN) 600 MG tablet TAKE 2 TABLETS BY MOUTH AT BEDTIME   isosorbide mononitrate (IMDUR) 60 MG 24 hr tablet TAKE 1 TABLET(60 MG) BY MOUTH DAILY   metFORMIN (GLUCOPHAGE-XR) 500 MG 24 hr tablet Take 1 tablet (500 mg total) by mouth in the morning and at bedtime. 1 tab bid   metoprolol succinate (TOPROL-XL) 50 MG 24 hr tablet Take 50 mg by mouth daily.   nitroGLYCERIN (NITROSTAT) 0.4 MG SL tablet Place 1 tablet (0.4 mg total) under the tongue every 5 (five) minutes as needed for chest pain.   pantoprazole (PROTONIX) 40 MG tablet TAKE 1 TABLET(40 MG) BY MOUTH DAILY   ramipril (ALTACE) 10 MG capsule TAKE 1 CAPSULE(10 MG) BY MOUTH DAILY   XARELTO 20 MG TABS tablet TAKE 1 TABLET(20 MG) BY MOUTH DAILY WITH SUPPER   No facility-administered encounter medications on file as of 11/25/2021.   Have you had any problems recently with your health? Patient states he has been doing well with his health.  Have you had any problems with your pharmacy? Patient states he has no problems with his pharmacy.  What issues or side effects are you having with your medications? Patient states he has no issues or side effects with any of his medications.  What would you like me to pass along to Edison Nasuti Potts,CPP for them to help you with?  Patient states he cannot afford his Xarelto medication it cost him $500 due to him being in the "donut whole" because of his insurance and has not been taking his Xarelto for about 1  month now. I recommended that I would speak to clinical pharmacist for further assistance.  What can we do to take care of you better? Patient states there is nothing at this time.  Care Gaps: Zoster Vaccines- Shingrix:Never done COVID-19 Vaccine:Last completed: Dec 13, 2020 FOOT EXAM:Last completed: Apr 12, 2020  Star Rating Drugs: Metformin 500 mg Last filled:10/03/21 40 DS Farxiga 10 mg Last filled:10/03/21 30 DS Atorvastatin 80 mg Last filled:10/03/21 90 DS  Myriam Elta [place], [place]

## 2021-11-29 ENCOUNTER — Telehealth: Payer: Self-pay

## 2021-11-29 NOTE — Telephone Encounter (Signed)
Incoming call from [name], reviewed results from stress test with him, advised Dr. Rockey Situ stated  "Myoview  No significant ischemia  Normal ejection fraction  Low risk study "   [name] very thankful for the phone call of his results, all questions and concerns were address with nothing further at this time. Will see at next schedule f/u appt.

## 2021-11-29 NOTE — Telephone Encounter (Signed)
Attempted to reach pt again by phone, again unable to make contact.   Will await pt to call back

## 2021-11-29 NOTE — Telephone Encounter (Signed)
Patient is returning your call.  

## 2021-11-29 NOTE — Telephone Encounter (Signed)
Attempted to reach pt via numbers on file, [phone] is not a working number.  Called the [phone], [phone], unable to get pt, phone rings then to VM, left another detail message.

## 2021-11-29 NOTE — Telephone Encounter (Signed)
Patient returning call.

## 2021-11-29 NOTE — Telephone Encounter (Signed)
Left detail message on VM of pt's recent results okay by DPR, Dr. Rockey Situ advised   "Myoview  No significant ischemia  Normal ejection fraction  Low risk study "  At this time, no further recommendations or medications changes, advised to call office for any concerns or questions, otherwise will see at next visit.

## 2021-12-02 NOTE — Telephone Encounter (Signed)
Patient should no longer be paying this much. He is enrolled in the [employer] (they use [employer] and he should be getting the medications from them). The most he should be paying is $85/month while in the donut hole April-Dec. Can you contact this program  [phone] to confirm his enrollment and update patient?

## 2021-12-05 ENCOUNTER — Ambulatory Visit: Payer: Medicare HMO | Admitting: Dermatology

## 2021-12-05 ENCOUNTER — Other Ambulatory Visit: Payer: Self-pay

## 2021-12-05 ENCOUNTER — Other Ambulatory Visit: Payer: Medicare HMO

## 2021-12-05 DIAGNOSIS — E785 Hyperlipidemia, unspecified: Secondary | ICD-10-CM

## 2021-12-05 DIAGNOSIS — Z1329 Encounter for screening for other suspected endocrine disorder: Secondary | ICD-10-CM | POA: Diagnosis not present

## 2021-12-05 DIAGNOSIS — E119 Type 2 diabetes mellitus without complications: Secondary | ICD-10-CM | POA: Diagnosis not present

## 2021-12-05 DIAGNOSIS — Z125 Encounter for screening for malignant neoplasm of prostate: Secondary | ICD-10-CM

## 2021-12-05 DIAGNOSIS — L820 Inflamed seborrheic keratosis: Secondary | ICD-10-CM

## 2021-12-05 DIAGNOSIS — D649 Anemia, unspecified: Secondary | ICD-10-CM

## 2021-12-05 DIAGNOSIS — L578 Other skin changes due to chronic exposure to nonionizing radiation: Secondary | ICD-10-CM | POA: Diagnosis not present

## 2021-12-05 DIAGNOSIS — L821 Other seborrheic keratosis: Secondary | ICD-10-CM

## 2021-12-05 DIAGNOSIS — L570 Actinic keratosis: Secondary | ICD-10-CM

## 2021-12-05 LAB — BAYER DCA HB A1C WAIVED: HB A1C (BAYER DCA - WAIVED): 6.2 % — ABNORMAL HIGH (ref 4.8–5.6)

## 2021-12-05 LAB — MICROALBUMIN, URINE WAIVED
Creatinine, Urine Waived: 200 mg/dL (ref 10–300)
Microalb, Ur Waived: 80 mg/L — ABNORMAL HIGH (ref 0–19)

## 2021-12-05 NOTE — Patient Instructions (Signed)

## 2021-12-05 NOTE — Progress Notes (Signed)
° °  New Patient Visit  Subjective  Ernest [name] is a 70 y.o. male who presents for the following: Other (New patient - has spots on arms and ears that are sometimes scaly). The patient has spots, moles and lesions to be evaluated, some may be new or changing and the patient has concerns that these could be cancer.  Referred by Dr Neomia Dear  The following portions of the chart were reviewed this encounter and updated as appropriate:   Tobacco   Allergies   Meds   Problems   Med Hx   Surg Hx   Fam Hx       Review of Systems:  No other skin or systemic complaints except as noted in HPI or Assessment and Plan.  Objective  Well appearing patient in no apparent distress; mood and affect are within normal limits.  A focused examination was performed including Face, arms. Relevant physical exam findings are noted in the Assessment and Plan.  Arms (24) Erythematous stuck-on, waxy papule or plaque  Ears (6) Erythematous thin papules/macules with gritty scale.    Assessment & Plan  Inflamed seborrheic keratosis (24) Arms  Destruction of lesion - Arms Complexity: simple   Destruction method: cryotherapy   Informed consent: discussed and consent obtained   Timeout:  patient name, date of birth, surgical site, and procedure verified Lesion destroyed using liquid nitrogen: Yes   Region frozen until ice ball extended beyond lesion: Yes   Outcome: patient tolerated procedure well with no complications   Post-procedure details: wound care instructions given    AK (actinic keratosis) (6) Ears  Destruction of lesion - Ears Complexity: simple   Destruction method: cryotherapy   Informed consent: discussed and consent obtained   Timeout:  patient name, date of birth, surgical site, and procedure verified Lesion destroyed using liquid nitrogen: Yes   Region frozen until ice ball extended beyond lesion: Yes   Outcome: patient tolerated procedure well with no complications   Post-procedure  details: wound care instructions given    Actinic Damage - chronic, secondary to cumulative UV radiation exposure/sun exposure over time - diffuse scaly erythematous macules with underlying dyspigmentation - Recommend daily broad spectrum sunscreen SPF 30+ to sun-exposed areas, reapply every 2 hours as needed.  - Recommend staying in the shade or wearing long sleeves, sun glasses (UVA+UVB protection) and wide brim hats (4-inch brim around the entire circumference of the hat). - Call for new or changing lesions.  Seborrheic Keratoses - Stuck-on, waxy, tan-brown papules and/or plaques  - Benign-appearing - Discussed benign etiology and prognosis. - Observe - Call for any changes  Return in about 3 months (around 03/05/2022) for AK follow up, ISK follow up.   I, Ashok Cordia, CMA, am acting as scribe for Sarina Ser, MD . Documentation: I have reviewed the above documentation for accuracy and completeness, and I agree with the above.  Sarina Ser, MD

## 2021-12-05 NOTE — Progress Notes (Deleted)
° °  New Patient Visit  Subjective  Ernest [name] is a 70 y.o. male who presents for the following: Other (New patient - has spots on arms and ears that are sometimes scaly).  ***  The following portions of the chart were reviewed this encounter and updated as appropriate:   Tobacco   Allergies   Meds   Problems   Med Hx   Surg Hx   Fam Hx       Review of Systems:  No other skin or systemic complaints except as noted in HPI or Assessment and Plan.  Objective  Well appearing patient in no apparent distress; mood and affect are within normal limits.  {[id number]::"J full examination was performed including scalp, head, eyes, ears, nose, lips, neck, chest, axillae, abdomen, back, buttocks, bilateral upper extremities, bilateral lower extremities, hands, feet, fingers, toes, fingernails, and toenails. All findings within normal limits unless otherwise noted below."}  Arms (24) Erythematous stuck-on, waxy papule or plaque  Ears (6) Erythematous thin papules/macules with gritty scale.     Assessment & Plan   Actinic Damage - chronic, secondary to cumulative UV radiation exposure/sun exposure over time - diffuse scaly erythematous macules with underlying dyspigmentation - Recommend daily broad spectrum sunscreen SPF 30+ to sun-exposed areas, reapply every 2 hours as needed.  - Recommend staying in the shade or wearing long sleeves, sun glasses (UVA+UVB protection) and wide brim hats (4-inch brim around the entire circumference of the hat). - Call for new or changing lesions.  Seborrheic Keratoses - Stuck-on, waxy, tan-brown papules and/or plaques  - Benign-appearing - Discussed benign etiology and prognosis. - Observe - Call for any changes   Inflamed seborrheic keratosis (24) Arms  Destruction of lesion - Arms Complexity: simple   Destruction method: cryotherapy   Informed consent: discussed and consent obtained   Timeout:  patient name, date of birth, surgical site, and  procedure verified Lesion destroyed using liquid nitrogen: Yes   Region frozen until ice ball extended beyond lesion: Yes   Outcome: patient tolerated procedure well with no complications   Post-procedure details: wound care instructions given    AK (actinic keratosis) (6) Ears  Destruction of lesion - Ears Complexity: simple   Destruction method: cryotherapy   Informed consent: discussed and consent obtained   Timeout:  patient name, date of birth, surgical site, and procedure verified Lesion destroyed using liquid nitrogen: Yes   Region frozen until ice ball extended beyond lesion: Yes   Outcome: patient tolerated procedure well with no complications   Post-procedure details: wound care instructions given     Return in about 3 months (around 03/05/2022) for AK follow up, ISK follow up.   I, Ashok Cordia, CMA, am acting as scribe for Sarina Ser, MD .

## 2021-12-06 LAB — CMP14+EGFR
ALT: 15 IU/L (ref 0–44)
AST: 19 IU/L (ref 0–40)
Albumin/Globulin Ratio: 2 (ref 1.2–2.2)
Albumin: 4.5 g/dL (ref 3.7–4.7)
Alkaline Phosphatase: 90 IU/L (ref 44–121)
BUN/Creatinine Ratio: 19 (ref 10–24)
BUN: 23 mg/dL (ref 8–27)
Bilirubin Total: 1.4 mg/dL — ABNORMAL HIGH (ref 0.0–1.2)
CO2: 25 mmol/L (ref 20–29)
Calcium: 9.5 mg/dL (ref 8.6–10.2)
Chloride: 101 mmol/L (ref 96–106)
Creatinine, Ser: 1.22 mg/dL (ref 0.76–1.27)
Globulin, Total: 2.3 g/dL (ref 1.5–4.5)
Glucose: 115 mg/dL — ABNORMAL HIGH (ref 70–99)
Potassium: 4.2 mmol/L (ref 3.5–5.2)
Sodium: 141 mmol/L (ref 134–144)
Total Protein: 6.8 g/dL (ref 6.0–8.5)
eGFR: 63 mL/min/{1.73_m2} (ref >59)

## 2021-12-06 LAB — LIPID PANEL
Chol/HDL Ratio: 3 ratio (ref 0.0–5.0)
Cholesterol, Total: 73 mg/dL — ABNORMAL LOW (ref 100–199)
HDL: 24 mg/dL — ABNORMAL LOW (ref >39)
LDL Chol Calc (NIH): 22 mg/dL (ref 0–99)
Triglycerides: 158 mg/dL — ABNORMAL HIGH (ref 0–149)
VLDL Cholesterol Cal: 27 mg/dL (ref 5–40)

## 2021-12-06 LAB — PSA: Prostate Specific Ag, Serum: 0.7 ng/mL (ref 0.0–4.0)

## 2021-12-06 LAB — THYROID PANEL WITH TSH
Free Thyroxine Index: 2.4 (ref 1.2–4.9)
T3 Uptake Ratio: 32 % (ref 24–39)
T4, Total: 7.6 ug/dL (ref 4.5–12.0)
TSH: 2.96 u[IU]/mL (ref 0.450–4.500)

## 2021-12-13 ENCOUNTER — Encounter: Payer: Self-pay | Admitting: Internal Medicine

## 2021-12-13 ENCOUNTER — Ambulatory Visit (INDEPENDENT_AMBULATORY_CARE_PROVIDER_SITE_OTHER): Payer: Medicare HMO | Admitting: Internal Medicine

## 2021-12-13 ENCOUNTER — Other Ambulatory Visit: Payer: Self-pay

## 2021-12-13 DIAGNOSIS — I482 Chronic atrial fibrillation, unspecified: Secondary | ICD-10-CM

## 2021-12-13 DIAGNOSIS — I10 Essential (primary) hypertension: Secondary | ICD-10-CM | POA: Diagnosis not present

## 2021-12-13 DIAGNOSIS — G2581 Restless legs syndrome: Secondary | ICD-10-CM

## 2021-12-13 MED ORDER — METFORMIN HCL ER 500 MG PO TB24: 500.0000 mg | ORAL_TABLET | Freq: Two times a day (BID) | ORAL | 5 refills | Status: DC

## 2021-12-13 MED ORDER — DAPAGLIFLOZIN PROPANEDIOL 10 MG PO TABS: 10.0000 mg | ORAL_TABLET | Freq: Every day | ORAL | 6 refills | Status: DC

## 2021-12-13 MED ORDER — FUROSEMIDE 40 MG PO TABS
20.0000 mg | ORAL_TABLET | Freq: Two times a day (BID) | ORAL | 1 refills | Status: DC
Start: 2021-12-13 — End: 2022-07-04

## 2021-12-13 MED ORDER — ALBUTEROL SULFATE HFA 108 (90 BASE) MCG/ACT IN AERS: 2.0000 | INHALATION_SPRAY | RESPIRATORY_TRACT | 4 refills | Status: DC | PRN

## 2021-12-13 MED ORDER — ATORVASTATIN CALCIUM 80 MG PO TABS: ORAL_TABLET | ORAL | 1 refills | Status: DC

## 2021-12-13 MED ORDER — ISOSORBIDE MONONITRATE ER 60 MG PO TB24: ORAL_TABLET | ORAL | 3 refills | Status: DC

## 2021-12-13 MED ORDER — ASPIRIN 81 MG PO TBEC: 81.0000 mg | DELAYED_RELEASE_TABLET | Freq: Every day | ORAL | 12 refills | Status: DC

## 2021-12-13 MED ORDER — GABAPENTIN 600 MG PO TABS
ORAL_TABLET | ORAL | 0 refills | Status: DC
Start: 2021-12-13 — End: 2022-04-04

## 2021-12-13 MED ORDER — PANTOPRAZOLE SODIUM 40 MG PO TBEC: DELAYED_RELEASE_TABLET | ORAL | 2 refills | Status: DC

## 2021-12-13 NOTE — Patient Instructions (Signed)
Blood Pressure Record Sheet To take your blood pressure, you will need a blood pressure machine. You can buy a blood pressure machine (blood pressure monitor) at your clinic, drug store, or online. When choosing one, consider: An automatic monitor that has an arm cuff. A cuff that wraps snugly around your upper arm. You should be able to fit only one finger between your arm and the cuff. A device that stores blood pressure reading results. Do not choose a monitor that measures your blood pressure from your wrist or finger. Follow your health care provider's instructions for how to take your blood pressure. To use this form: Get one reading in the morning (a.m.) before you take any medicines. Get one reading in the evening (p.m.) before supper. Take at least 2 readings with each blood pressure check. This makes sure the results are correct. Wait 1-2 minutes between measurements. Write down the results in the spaces on this form. Repeat this once a week, or as told by your health care provider. Make a follow-up appointment with your health care provider to discuss the results. Blood pressure log Date: _______________________ a.m. _____________________(1st reading) _____________________(2nd reading) p.m. _____________________(1st reading) _____________________(2nd reading) Date: _______________________ a.m. _____________________(1st reading) _____________________(2nd reading) p.m. _____________________(1st reading) _____________________(2nd reading) Date: _______________________ a.m. _____________________(1st reading) _____________________(2nd reading) p.m. _____________________(1st reading) _____________________(2nd reading) Date: _______________________ a.m. _____________________(1st reading) _____________________(2nd reading) p.m. _____________________(1st reading) _____________________(2nd reading) Date: _______________________ a.m. _____________________(1st reading)  _____________________(2nd reading) p.m. _____________________(1st reading) _____________________(2nd reading) This information is not intended to replace advice given to you by your health care provider. Make sure you discuss any questions you have with your health care provider. Document Revised: 03/23/2020 Document Reviewed: 03/24/2020 Elsevier Patient Education  [place].

## 2021-12-13 NOTE — Progress Notes (Signed)
BP (!) 145/80    Pulse 76    Temp 97.8 F (36.6 C) (Oral)    Ht 5' 8.11" (1.73 m)    Wt 246 lb 6.4 oz (111.8 kg)    SpO2 98%    BMI 37.34 kg/m    Subjective:    Patient ID: Ernest [name], male    DOB: Nov 10, 1951, 70 y.o.   MRN: [id number]  Chief Complaint  Patient presents with   Hypertension   Hyperlipidemia   Diabetes    HPI: Ernest [name] is a 70 y.o. male  Hypertension This is a chronic (bp stable. is on metoprolol for such ramipirl and corag / cardizem for CAD) problem. The current episode started more than 1 month ago. Pertinent negatives include no chest pain, headaches, palpitations or shortness of breath.  Hyperlipidemia This is a chronic (zetia , lipitor 80 mg daily. ldl at goal TG high normal) problem. Pertinent negatives include no chest pain, myalgias or shortness of breath.  Diabetes He presents for his follow-up (metformin 500 mg daily for such) diabetic visit. He has type 2 diabetes mellitus. Pertinent negatives for hypoglycemia include no confusion, dizziness, headaches, speech difficulty or tremors. Pertinent negatives for diabetes include no chest pain, no fatigue, no polydipsia, no polyphagia, no polyuria and no weakness.   Chief Complaint  Patient presents with   Hypertension   Hyperlipidemia   Diabetes    Relevant past medical, surgical, family and social history reviewed and updated as indicated. Interim medical history since our last visit reviewed. Allergies and medications reviewed and updated.  Review of Systems  Constitutional:  Negative for activity change, appetite change, chills, fatigue and fever.  HENT:  Negative for congestion, ear discharge, ear pain and facial swelling.   Eyes:  Negative for pain, discharge and itching.  Respiratory:  Negative for cough, chest tightness, shortness of breath and wheezing.   Cardiovascular:  Negative for chest pain, palpitations and leg swelling.  Gastrointestinal:  Negative for abdominal  distention, abdominal pain, blood in stool, constipation, diarrhea, nausea and vomiting.  Endocrine: Negative for cold intolerance, heat intolerance, polydipsia, polyphagia and polyuria.  Genitourinary:  Negative for difficulty urinating, dysuria, flank pain, frequency, hematuria and urgency.  Musculoskeletal:  Negative for arthralgias, gait problem, joint swelling and myalgias.  Skin:  Negative for color change, rash and wound.  Neurological:  Negative for dizziness, tremors, speech difficulty, weakness, light-headedness, numbness and headaches.  Hematological:  Does not bruise/bleed easily.  Psychiatric/Behavioral:  Negative for agitation, confusion, decreased concentration, sleep disturbance and suicidal ideas.    Per HPI unless specifically indicated above     Objective:    BP (!) 145/80    Pulse 76    Temp 97.8 F (36.6 C) (Oral)    Ht 5' 8.11" (1.73 m)    Wt 246 lb 6.4 oz (111.8 kg)    SpO2 98%    BMI 37.34 kg/m   Wt Readings from Last 3 Encounters:  12/13/21 246 lb 6.4 oz (111.8 kg)  11/07/21 253 lb 6 oz (114.9 kg)  09/12/21 262 lb 9.6 oz (119.1 kg)    Physical Exam Vitals and nursing note reviewed.  Constitutional:      General: He is not in acute distress.    Appearance: Normal appearance. He is not ill-appearing or diaphoretic.  HENT:     Head: Normocephalic and atraumatic.     Right Ear: Tympanic membrane and external ear normal. There is no impacted cerumen.     Left  Ear: External ear normal.     Nose: No congestion or rhinorrhea.     Mouth/Throat:     Pharynx: No oropharyngeal exudate or posterior oropharyngeal erythema.  Eyes:     Conjunctiva/sclera: Conjunctivae normal.     Pupils: Pupils are equal, round, and reactive to light.  Cardiovascular:     Rate and Rhythm: Normal rate and regular rhythm.     Heart sounds: No murmur heard.   No friction rub. No gallop.  Pulmonary:     Effort: No respiratory distress.     Breath sounds: No stridor. No wheezing or  rhonchi.  Chest:     Chest wall: No tenderness.  Abdominal:     General: Abdomen is flat. Bowel sounds are normal.     Palpations: Abdomen is soft. There is no mass.     Tenderness: There is no abdominal tenderness.  Musculoskeletal:        General: No swelling, tenderness or deformity.     Cervical back: Normal range of motion and neck supple. No rigidity or tenderness.     Left lower leg: No edema.  Skin:    General: Skin is warm and dry.  Neurological:     Mental Status: He is alert.    Results for orders placed or performed in visit on 12/05/21  Microalbumin, Urine Waived  Result Value Ref Range   Microalb, Ur Waived 80 (H) 0 - 19 mg/L   Creatinine, Urine Waived 200 10 - 300 mg/dL   Microalb/Creat Ratio 30-300 (H) <30 mg/g  Lipid panel  Result Value Ref Range   Cholesterol, Total 73 (L) 100 - 199 mg/dL   Triglycerides 158 (H) 0 - 149 mg/dL   HDL 24 (L) >39 mg/dL   VLDL Cholesterol Cal 27 5 - 40 mg/dL   LDL Chol Calc (NIH) 22 0 - 99 mg/dL   Chol/HDL Ratio 3.0 0.0 - 5.0 ratio  Bayer DCA Hb A1c Waived  Result Value Ref Range   HB A1C (BAYER DCA - WAIVED) 6.2 (H) 4.8 - 5.6 %  CMP14+EGFR  Result Value Ref Range   Glucose 115 (H) 70 - 99 mg/dL   BUN 23 8 - 27 mg/dL   Creatinine, Ser 1.22 0.76 - 1.27 mg/dL   eGFR 63 >59 mL/min/1.73   BUN/Creatinine Ratio 19 10 - 24   Sodium 141 134 - 144 mmol/L   Potassium 4.2 3.5 - 5.2 mmol/L   Chloride 101 96 - 106 mmol/L   CO2 25 20 - 29 mmol/L   Calcium 9.5 8.6 - 10.2 mg/dL   Total Protein 6.8 6.0 - 8.5 g/dL   Albumin 4.5 3.7 - 4.7 g/dL   Globulin, Total 2.3 1.5 - 4.5 g/dL   Albumin/Globulin Ratio 2.0 1.2 - 2.2   Bilirubin Total 1.4 (H) 0.0 - 1.2 mg/dL   Alkaline Phosphatase 90 44 - 121 IU/L   AST 19 0 - 40 IU/L   ALT 15 0 - 44 IU/L  Thyroid Panel With TSH  Result Value Ref Range   TSH 2.960 0.450 - 4.500 uIU/mL   T4, Total 7.6 4.5 - 12.0 ug/dL   T3 Uptake Ratio 32 24 - 39 %   Free Thyroxine Index 2.4 1.2 - 4.9  PSA   Result Value Ref Range   Prostate Specific Ag, Serum 0.7 0.0 - 4.0 ng/mL        Current Outpatient Medications:    ACCU-CHEK GUIDE test strip, , Disp: , Rfl:    albuterol (  VENTOLIN HFA) 108 (90 Base) MCG/ACT inhaler, Inhale 2 puffs into the lungs every 4 (four) hours as needed for wheezing or shortness of breath. (Patient taking differently: Inhale 2 puffs into the lungs every 4 (four) hours as needed for wheezing or shortness of breath. Uses 2-3 times daily), Disp: 18 g, Rfl: 6   atorvastatin (LIPITOR) 80 MG tablet, TAKE 1 TABLET(80 MG) BY MOUTH DAILY, Disp: 90 tablet, Rfl: 1   clotrimazole-betamethasone (LOTRISONE) cream, Apply 1 application topically 2 (two) times daily., Disp: 30 g, Rfl: 2   dapagliflozin propanediol (FARXIGA) 10 MG TABS tablet, Take 1 tablet (10 mg total) by mouth daily., Disp: 30 tablet, Rfl: 6   ezetimibe (ZETIA) 10 MG tablet, Take 1 tablet (10 mg total) by mouth daily., Disp: 90 tablet, Rfl: 3   Fluticasone-Salmeterol (ADVAIR) 100-50 MCG/DOSE AEPB, Inhale 1 puff into the lungs 2 (two) times daily., Disp: 1 each, Rfl: 3   furosemide (LASIX) 40 MG tablet, Take 0.5 tablets (20 mg total) by mouth 2 (two) times daily., Disp: 180 tablet, Rfl: 1   gabapentin (NEURONTIN) 600 MG tablet, TAKE 2 TABLETS BY MOUTH AT BEDTIME, Disp: 180 tablet, Rfl: 0   isosorbide mononitrate (IMDUR) 60 MG 24 hr tablet, TAKE 1 TABLET(60 MG) BY MOUTH DAILY, Disp: 30 tablet, Rfl: 3   metFORMIN (GLUCOPHAGE-XR) 500 MG 24 hr tablet, Take 1 tablet (500 mg total) by mouth in the morning and at bedtime. 1 tab bid, Disp: 60 tablet, Rfl: 5   metoprolol succinate (TOPROL-XL) 50 MG 24 hr tablet, Take 50 mg by mouth daily., Disp: , Rfl:    nitroGLYCERIN (NITROSTAT) 0.4 MG SL tablet, Place 1 tablet (0.4 mg total) under the tongue every 5 (five) minutes as needed for chest pain., Disp: 25 tablet, Rfl: 2   ramipril (ALTACE) 10 MG capsule, TAKE 1 CAPSULE(10 MG) BY MOUTH DAILY, Disp: 90 capsule, Rfl: 0   XARELTO 20  MG TABS tablet, TAKE 1 TABLET(20 MG) BY MOUTH DAILY WITH SUPPER, Disp: 90 tablet, Rfl: 0   carvedilol (COREG) 25 MG tablet, Take 1 tablet (25 mg total) by mouth 2 (two) times daily., Disp: 180 tablet, Rfl: 3   diltiazem (CARDIZEM CD) 180 MG 24 hr capsule, Take 1 capsule (180 mg total) by mouth daily., Disp: 90 capsule, Rfl: 3    Assessment & Plan:  Afib/ CAD PCI to LAD 03/07/19 Is off of xarelto  Needs to be on ? Coumadin.  Will d/w Dr. Rockey Situ pts cardiologist.  Will start pt on asa Fu and mx per cards   HTN continues to be high.  HTN :  Continue current meds.  Medication compliance emphasised. pt advised to keep Bp logs. Pt verbalised understanding of the same. Pt to have a low salt diet . Exercise to reach a goal of at least 150 mins a week.  lifestyle modifications explained and pt understands importance of the above.  HLD continue lipitor and zetia LDL at goal 22  recheck FLP, check LFT's work on diet, SE of meds explained to pt. low fat and high fiber diet explained to pt.  Problem List Items Addressed This Visit       Cardiovascular and Mediastinum   Atrial fibrillation ([place])     Other   Restless legs     No orders of the defined types were placed in this encounter.    No orders of the defined types were placed in this encounter.    Follow up plan: No follow-ups on file.

## 2021-12-17 ENCOUNTER — Encounter: Payer: Self-pay | Admitting: Dermatology

## 2021-12-20 ENCOUNTER — Ambulatory Visit (INDEPENDENT_AMBULATORY_CARE_PROVIDER_SITE_OTHER): Payer: Medicare HMO | Admitting: Physician Assistant

## 2021-12-20 ENCOUNTER — Other Ambulatory Visit: Payer: Self-pay

## 2021-12-20 ENCOUNTER — Encounter: Payer: Self-pay | Admitting: Physician Assistant

## 2021-12-20 VITALS — BP 140/60 | HR 73 | Ht 71.0 in | Wt 247.5 lb

## 2021-12-20 DIAGNOSIS — E785 Hyperlipidemia, unspecified: Secondary | ICD-10-CM | POA: Diagnosis not present

## 2021-12-20 DIAGNOSIS — I10 Essential (primary) hypertension: Secondary | ICD-10-CM | POA: Diagnosis not present

## 2021-12-20 DIAGNOSIS — I2510 Atherosclerotic heart disease of native coronary artery without angina pectoris: Secondary | ICD-10-CM

## 2021-12-20 DIAGNOSIS — I4821 Permanent atrial fibrillation: Secondary | ICD-10-CM | POA: Diagnosis not present

## 2021-12-20 MED ORDER — RIVAROXABAN 20 MG PO TABS: 20.0000 mg | ORAL_TABLET | Freq: Every day | ORAL | 3 refills | Status: DC

## 2021-12-20 NOTE — Progress Notes (Signed)
Cardiology Office Note    Date:  12/20/2021   ID:  Ernest [name], DOB 07-08-1951, MRN [id number]  PCP:  [name], MD  Cardiologist:  Ida Rogue, MD  Electrophysiologist:  None   Chief Complaint: Anticoagulation cost concern  History of Present Illness:   Ernest [name] is a 71 y.o. male with history of CAD status post PCI/DES to the LAD in 02/2019, permanent A. fib, DM2, CKD stage III, HTN, HLD, iron deficiency anemia, and OSA who presents for discussion of anticoagulation cost concerns.  He was admitted to the hospital in 02/2019 with a small NSTEMI.  LHC showed severe single-vessel CAD with 80% mid LAD stenosis which was successfully treated with PCI/DES.  There was nonobstructive disease involving the left main coronary artery, LCx, and RCA.  Echo at that time demonstrated an EF of 55 to 60%, moderately increased LV wall thickness, indeterminate LV diastolic function parameters, normal RV systolic function and ventricular cavity size, mildly to moderately dilated left atrium, and mild mitral annular calcification.  Most recent echo from 04/2021 demonstrated an EF of 60 to 65%, no regional wall motion abnormalities, mildly dilated internal LV cavity size, indeterminate LV diastolic function parameters, normal RV systolic function with mildly enlarged RV cavity size, elevated PASP, moderately dilated right atrium, and mild mitral regurgitation.  He was recently seen by his primary cardiologist in 10/2021 and was having to take nitro occasionally with concerns of chest pain.  Given this, he underwent Lexiscan MPI in 11/2021 which demonstrated no significant ischemia or scar and was overall low risk.  Follow-up was recommended in 12 months.  He walked into the office today to discuss anticoagulation cost concerns and was added onto my schedule given a prior cancellation.  From a cardiac perspective he is doing well at this time without symptoms concerning for angina or  decompensation.  He was previously approved for Xarelto at the $85 per month cost in 2021.  He received this medication in November at this cost.  He never received a shipment of medication in the month of December.  He has been without anticoagulation since the end of November.  He is concerned that his cost of Xarelto may go back up and may not be financially feasible to continue this medication.  He is here today to discuss what his options are.  He denies any falls, hematochezia, or melena.  Of note, he has carvedilol and metoprolol on his medication list and is uncertain if he is taking just 1 of these, both of these, or neither of these.   Labs independently reviewed: 11/2021 - TSH normal, BUN 23, serum creatinine 1.22, potassium 4.2, albumin 4.5, AST/ALT normal, TC 73, TG 158, HDL 24, LDL 22, A1c 6.2 10/2021 - Hgb 13.5, PLT 170  Past Medical History:  Diagnosis Date   A-fib ([place])    Anemia    Asthma    COPD (chronic obstructive pulmonary disease) (HCC)    Diabetes mellitus    Type II   Early Pulmonary fibrosis (HCC)    GERD (gastroesophageal reflux disease)    History of echocardiogram    a. 02/2017 Echo: EF 60-65%, no rwma, mild MR, mod dil LA. Nl RV fxn. PASP 47mmHg.   Hyperlipidemia    Hypertension    Morbid obesity ([place])    Myocardial infarction [facility])    "light"   Non-obstructive CAD (coronary artery disease)    a. 2011 Cath: nonobs dzs; b. 07/2014 Cath: LM 30, LAD nl, LCX  nl, RCA 20p, 37m.   Permanent atrial fibrillation (HCC)    a. CHA2DS2VASc = 4-->xarelto.   Polyp of sigmoid colon     Past Surgical History:  Procedure Laterality Date   ANKLE SURGERY     CARDIAC CATHETERIZATION  05/2010   [facility]   CARDIAC CATHETERIZATION     [facility]   CARDIAC CATHETERIZATION     [facility]   CARDIAC CATHETERIZATION  07/2014   [place]   COLONOSCOPY     COLONOSCOPY WITH PROPOFOL N/A 06/26/2018   Procedure: COLONOSCOPY WITH PROPOFOL;  Surgeon: Virgel Manifold, MD;  Location: [facility] ENDOSCOPY;   Service: Endoscopy;  Laterality: N/A;   COLONOSCOPY WITH PROPOFOL N/A 05/23/2021   Procedure: COLONOSCOPY WITH PROPOFOL;  Surgeon: Virgel Manifold, MD;  Location: [facility] ENDOSCOPY;  Service: Endoscopy;  Laterality: N/A;   CORONARY ARTERY BYPASS GRAFT     CORONARY STENT INTERVENTION N/A 03/07/2019   Procedure: CORONARY STENT INTERVENTION;  Surgeon: Nelva Bush, MD;  Location: [place] CV LAB;  Service: Cardiovascular;  Laterality: N/A;   ENTEROSCOPY N/A 08/05/2018   Procedure: ENTEROSCOPY;  Surgeon: Virgel Manifold, MD;  Location: [facility] ENDOSCOPY;  Service: Endoscopy;  Laterality: N/A;   ESOPHAGOGASTRODUODENOSCOPY (EGD) WITH PROPOFOL N/A 06/26/2018   Procedure: ESOPHAGOGASTRODUODENOSCOPY (EGD) WITH PROPOFOL;  Surgeon: Virgel Manifold, MD;  Location: [facility] ENDOSCOPY;  Service: Endoscopy;  Laterality: N/A;   ESOPHAGOGASTRODUODENOSCOPY (EGD) WITH PROPOFOL N/A 05/23/2021   Procedure: PUSH ESOPHAGOGASTRODUODENOSCOPY (EGD) WITH PROPOFOL;  Surgeon: Virgel Manifold, MD;  Location: [facility] ENDOSCOPY;  Service: Endoscopy;  Laterality: N/A;   LEFT HEART CATH AND CORONARY ANGIOGRAPHY N/A 03/07/2019   Procedure: LEFT HEART CATH AND CORONARY ANGIOGRAPHY;  Surgeon: Nelva Bush, MD;  Location: [place] CV LAB;  Service: Cardiovascular;  Laterality: N/A;   TESTICLE SURGERY  70's   TUMOR EXCISION     Neck and finger; benign   WRIST SURGERY      Current Medications: Current Meds  Medication Sig   ACCU-CHEK GUIDE test strip    albuterol (VENTOLIN HFA) 108 (90 Base) MCG/ACT inhaler Inhale 2 puffs into the lungs every 4 (four) hours as needed for wheezing or shortness of breath. Uses 2-3 times daily   aspirin 81 MG EC tablet Take 1 tablet (81 mg total) by mouth daily. Swallow whole.   atorvastatin (LIPITOR) 80 MG tablet TAKE 1 TABLET(80 MG) BY MOUTH DAILY   carvedilol (COREG) 25 MG tablet Take 1 tablet (25 mg total) by mouth 2 (two) times daily.   clotrimazole-betamethasone (LOTRISONE)  cream Apply 1 application topically 2 (two) times daily.   dapagliflozin propanediol (FARXIGA) 10 MG TABS tablet Take 1 tablet (10 mg total) by mouth daily.   diltiazem (CARDIZEM CD) 180 MG 24 hr capsule Take 1 capsule (180 mg total) by mouth daily.   ezetimibe (ZETIA) 10 MG tablet Take 1 tablet (10 mg total) by mouth daily.   Fluticasone-Salmeterol (ADVAIR) 100-50 MCG/DOSE AEPB Inhale 1 puff into the lungs 2 (two) times daily.   furosemide (LASIX) 40 MG tablet Take 0.5 tablets (20 mg total) by mouth 2 (two) times daily.   gabapentin (NEURONTIN) 600 MG tablet TAKE 2 TABLETS BY MOUTH AT BEDTIME   isosorbide mononitrate (IMDUR) 60 MG 24 hr tablet TAKE 1 TABLET(60 MG) BY MOUTH DAILY   metFORMIN (GLUCOPHAGE-XR) 500 MG 24 hr tablet Take 1 tablet (500 mg total) by mouth in the morning and at bedtime. 1 tab bid   metoprolol succinate (TOPROL-XL) 50 MG 24 hr tablet Take 50 mg by  mouth daily.   nitroGLYCERIN (NITROSTAT) 0.4 MG SL tablet Place 1 tablet (0.4 mg total) under the tongue every 5 (five) minutes as needed for chest pain.   pantoprazole (PROTONIX) 40 MG tablet TAKE 1 TABLET(40 MG) BY MOUTH DAILY   ramipril (ALTACE) 10 MG capsule TAKE 1 CAPSULE(10 MG) BY MOUTH DAILY   rivaroxaban (XARELTO) 20 MG TABS tablet Take 1 tablet (20 mg total) by mouth daily with supper.    Allergies:   Doxycycline   Social History   Socioeconomic History   Marital status: Widowed    Spouse name: Not on file   Number of children: Not on file   Years of education: Not on file   Highest education level: 8th grade  Occupational History   Occupation: part time   Tobacco Use   Smoking status: Former    Packs/day: 4.00    Years: 28.00    Pack years: 112.00    Types: Cigarettes    Quit date: 08/25/1993    Years since quitting: 28.3   Smokeless tobacco: Never  Vaping Use   Vaping Use: Never used  Substance and Sexual Activity   Alcohol use: Not Currently   Drug use: No   Sexual activity: Not Currently  Other  Topics Concern   Not on file  Social History Narrative   No regular exercise.   Social Determinants of Health   Financial Resource Strain: Low Risk    Difficulty of Paying Living Expenses: Not hard at all  Food Insecurity: No Food Insecurity   Worried About Charity fundraiser in the Last Year: Never true   [place] in the Last Year: Never true  Transportation Needs: No Transportation Needs   Lack of Transportation (Medical): No   Lack of Transportation (Non-Medical): No  Physical Activity: Inactive   Days of Exercise per Week: 0 days   Minutes of Exercise per Session: 0 min  Stress: No Stress Concern Present   Feeling of Stress : Not at all  Social Connections: Not on file     Family History:  The patient's family history includes Alcohol abuse in an other family member; Cancer in his brother; Coronary artery disease in an other family member; Depression in an other family member; Diabetes in his mother and another family member; Heart disease in his mother; Hyperlipidemia in his mother and another family member; Hypertension in his mother and another family member; Kidney Stones in his brother; Pneumonia in his father.  ROS:   Review of Systems  Constitutional:  Positive for malaise/fatigue. Negative for chills, diaphoresis, fever and weight loss.  HENT:  Negative for congestion.   Eyes:  Negative for discharge and redness.  Respiratory:  Negative for cough, sputum production, shortness of breath and wheezing.   Cardiovascular:  Negative for chest pain, palpitations, orthopnea, claudication, leg swelling and PND.  Gastrointestinal:  Negative for abdominal pain, blood in stool, heartburn, melena, nausea and vomiting.  Musculoskeletal:  Negative for falls and myalgias.  Skin:  Negative for rash.  Neurological:  Negative for dizziness, tingling, tremors, sensory change, speech change, focal weakness, loss of consciousness and weakness.  Endo/Heme/Allergies:  Does not  bruise/bleed easily.  Psychiatric/Behavioral:  Negative for substance abuse. The patient is not nervous/anxious.   All other systems reviewed and are negative.   EKGs/Labs/Other Studies Reviewed:    Studies reviewed were summarized above. The additional studies were reviewed today:   Lexiscan MPI 11/2021:   Normal pharmacologic myocardial perfusion stress test  without evidence of significant ischemia or scar.   Normal left ventricular systolic function (LVEF > 65%).   Attenuation correction CT is notable for coronary artery calcification and/or coronary stent(s), aortic atherosclerosis, pericardial calcification, and calcified left hilar lymph node.   This is a low risk study __________  2D echo 04/2021: 1. Left ventricular ejection fraction, by estimation, is 60 to 65%. The  left ventricle has normal function. The left ventricle has no regional  wall motion abnormalities. The left ventricular internal cavity size was  mildly dilated. Left ventricular  diastolic parameters are indeterminate.   2. Right ventricular systolic function is normal. The right ventricular  size is mildly enlarged. There is moderately elevated pulmonary artery  systolic pressure. The estimated right ventricular systolic pressure is  24.0 mmHg.   3. Right atrial size was moderately dilated.   4. The mitral valve is normal in structure. Mild mitral valve  regurgitation. __________  2D echo 02/2019: 1. The left ventricle has normal systolic function, with an ejection  fraction of 55-60%. The cavity size was normal. There is moderately  increased left ventricular wall thickness. Left ventricular diastolic  function could not be evaluated secondary to  atrial fibrillation.   2. The right ventricle has normal systolic function. The cavity was  normal. There is no increase in right ventricular wall thickness.   3. Left atrial size was mild-moderately dilated.   4. The aortic valve was not well visualized Mild  thickening of the aortic  valve. Mild to moderate aortic annular calcification noted.   5. The mitral valve was not well visualized. There is mild mitral annular  calcification present.   6. The interatrial septum was not well visualized. __________  LHC 02/2019: Conclusions: Severe single-vessel CAD with 80% mid LAD stenosis. Non-obstructive LMCA, LCx, and RCA disease. Upper normal left ventricular filling pressure with normal ejection fraction. Mild to moderate mitral regurgitation in the setting of catheter-induced non-sustained ventricular tachycardia. Successful PCI to mid LAD using Synergy 2.5 x 24 mm drug-eluting stent (post-dilated to 2.8 mm) with 0% residual stenosis and TIMI-3 flow.   Recommendations: Overnight monitoring. Obtain echo to better evaluate mitral regurgitation noted on left ventriculogram. Restart heparin infusion 2 hours after TR band removal. If no evidence of bleeding/vascular injury, recommend restarting rivaroxaban tomorrow and continuing rivaroxaban 20 mg daily and clopidogrel 75 mg daily x 12 months. Aggressive secondary prevention. __________  2D echo 02/2017: - Left ventricle: The cavity size was normal. Systolic function was    normal. The estimated ejection fraction was in the range of 60%    to 65%. Wall motion was normal; there were no regional wall    motion abnormalities. The study is not technically sufficient to    allow evaluation of LV diastolic function.  - Mitral valve: There was mild regurgitation.  - Left atrium: The atrium was moderately dilated.  - Right ventricle: Systolic function was normal.  - Pulmonary arteries: Systolic pressure was mildly elevated. PA    peak pressure: 43 mm Hg (S).   Impressions:   - Rhythm is atrial fibrillation.   EKG:  EKG is ordered today.  The EKG ordered today demonstrates A. fib, 73 bpm, nonspecific ST-T changes  Recent Labs: 11/14/2021: Hemoglobin 13.5; Platelets 170 12/05/2021: ALT 15; BUN 23;  Creatinine, Ser 1.22; Potassium 4.2; Sodium 141; TSH 2.960  Recent Lipid Panel    Component Value Date/Time   CHOL 73 (L) 12/05/2021 0838   CHOL 195 08/01/2017 0902   CHOL  104 07/25/2014 0425   TRIG 158 (H) 12/05/2021 0838   TRIG 268 (H) 08/01/2017 0902   TRIG 243 (H) 07/25/2014 0425   TRIG 249 05/27/2010 0000   HDL 24 (L) 12/05/2021 0838   HDL 25 (L) 07/25/2014 0425   CHOLHDL 3.0 12/05/2021 0838   VLDL 54 (H) 08/01/2017 0902   VLDL 49 (H) 07/25/2014 0425   LDLCALC 22 12/05/2021 0838   LDLCALC 30 07/25/2014 0425    PHYSICAL EXAM:    VS:  BP 140/60 (BP Location: Left Arm, Patient Position: Sitting, Cuff Size: Normal)    Pulse 73    Ht 5\' 11"  (1.803 m)    Wt 247 lb 8 oz (112.3 kg)    SpO2 99%    BMI 34.52 kg/m   BMI: Body mass index is 34.52 kg/m.  Physical Exam Vitals reviewed.  Constitutional:      Appearance: He is well-developed.  HENT:     Head: Normocephalic and atraumatic.  Eyes:     General:        Right eye: No discharge.        Left eye: No discharge.  Neck:     Vascular: No JVD.  Cardiovascular:     Rate and Rhythm: Normal rate. Rhythm irregularly irregular.     Pulses:          Posterior tibial pulses are 2+ on the right side and 2+ on the left side.     Heart sounds: Normal heart sounds, S1 normal and S2 normal. Heart sounds not distant. No midsystolic click and no opening snap. No murmur heard.   No friction rub.  Pulmonary:     Effort: Pulmonary effort is normal. No respiratory distress.     Breath sounds: Normal breath sounds. No decreased breath sounds, wheezing or rales.  Chest:     Chest wall: No tenderness.  Abdominal:     General: There is no distension.     Palpations: Abdomen is soft.     Tenderness: There is no abdominal tenderness.  Musculoskeletal:     Cervical back: Normal range of motion.     Right lower leg: Edema present.     Left lower leg: Edema present.     Comments: Trace bilateral pretibial edema  Skin:    General: Skin is  warm and dry.     Nails: There is no clubbing.  Neurological:     Mental Status: He is alert and oriented to person, place, and time.  Psychiatric:        Speech: Speech normal.        Behavior: Behavior normal.        Thought Content: Thought content normal.        Judgment: Judgment normal.    Wt Readings from Last 3 Encounters:  12/20/21 247 lb 8 oz (112.3 kg)  12/13/21 246 lb 6.4 oz (111.8 kg)  11/07/21 253 lb 6 oz (114.9 kg)     ASSESSMENT & PLAN:   Permanent A. fib: Ventricular rate is well controlled in the office today.  Upon reconciling his medications, it was noted the patient has carvedilol, Toprol-XL, and Cardizem CD on his medication list for rate control therapy.  He is uncertain if he is taking both of these beta-blockers or not.  He will review his medications at home and contact our office with further recommendations based on what he is actually taking.  Given a CHA2DS2-VASc of at least 4 (HTN, age x1, DM, vascular disease),  it is recommended he remain on indefinite anticoagulation.  He has been off anticoagulation since 10/2021 as he was part of the Xarelto medication program to receive this medication for $85.  He never received his medication supply for the month of December.  We did contact the company and they told us he did have a prescription on file and they were uncertain as to why this medication was never sent to him.  We have provided him with samples for Xarelto 20 mg to be taken with dinner.  Creatinine clearance at time of today's visit of 87.3.  We will send in a prescription for Xarelto 20 mg.  He will contact his pharmacy to see what the cost of this will be.  If it is affordable he will pick it up.  If not he will contact our office and we will undergo a trial of transitioning him to Eliquis.  If this also is limited by financial constraint, would refer him to the Coumadin clinic for initiation of warfarin.  Primary cardiologist may also consider initiation of  Pradaxa.  No falls or symptoms concerning for bleeding.  Recent labs stable as outlined above.  CAD involving the native coronary arteries without angina: He is without symptoms of angina or decompensation.  Recent Lexiscan MPI showed no evidence of ischemia or scar and was overall low risk.  Historically, he has been maintained on Xarelto in place of antiplatelet therapy to minimize bleeding risk in the context of iron deficiency anemia.  For the past month he has been without anticoagulation or antiplatelet therapy.  We did resume his Xarelto 20 mg daily today and samples were provided.  Plan regarding anticoagulation as outlined above.  No plans for further ischemic testing at this time.  HTN: Blood pressure is mildly elevated in the office today.  This was not discussed in detail at today's visit given problem focused visit as outlined above.  No changes in therapy unless dictated by beta-blocker usage as outlined above.  HLD: LDL 22.  He remains on atorvastatin and ezetimibe.   Disposition: F/u with Dr. Rockey Situ or an APP in 12 months as previously directed by his primary cardiologist, or sooner if needed.   Medication Adjustments/Labs and Tests Ordered: Current medicines are reviewed at length with the patient today.  Concerns regarding medicines are outlined above. Medication changes, Labs and Tests ordered today are summarized above and listed in the Patient Instructions accessible in Encounters.   Signed, Christell Faith, PA-C 12/20/2021 5:08 PM     [address] [place], [place] [address] [phone]

## 2021-12-20 NOTE — Patient Instructions (Signed)
Medication Instructions:   Your physician has recommended you make the following change in your medication:   1) START Xarelto 20 mg daily     - A new Rx has been sent to your pharmacy   - You have been given samples in the office today    - In the meantime, ask your pharmacy what the cost will be for the new Rx for Xarelto   - If cost is not affordable, let us know so that we may try another medication   2) Carvedilol and Metoprolol Succinate are on your med list. Please call our office after you arrive home to let us know if you are taking either medication with dosage.   *If you need a refill on your cardiac medications before your next appointment, please call your pharmacy*   Lab Work:  None ordered  Testing/Procedures:  None ordered   Follow-Up: At [facility], you and your health needs are our priority.  As part of our continuing mission to provide you with exceptional heart care, we have created designated Provider Care Teams.  These Care Teams include your primary Cardiologist (physician) and Advanced Practice Providers (APPs -  Physician Assistants and Nurse Practitioners) who all work together to provide you with the care you need, when you need it.  We recommend signing up for the patient portal called "MyChart".  Sign up information is provided on this After Visit Summary.  MyChart is used to connect with patients for Virtual Visits (Telemedicine).  Patients are able to view lab/test results, encounter notes, upcoming appointments, etc.  Non-urgent messages can be sent to your provider as well.   To learn more about what you can do with MyChart, go to [email or web address].    Your next appointment:   12 month(s)  The format for your next appointment:   In Person  Provider:   You may see Dr. Rockey Situ or one of the following Advanced Practice Providers on your designated Care Team:   Murray Hodgkins, NP Christell Faith, PA-C Cadence Kathlen Mody, [place]

## 2021-12-21 ENCOUNTER — Telehealth: Payer: Self-pay | Admitting: Physician Assistant

## 2021-12-21 NOTE — Telephone Encounter (Signed)
Thank you for the update.  I have removed carvedilol from his medication list.  He should continue Toprol at its current dose.

## 2021-12-21 NOTE — Telephone Encounter (Signed)
Patient calling with medication update is no longer taking Carvedilol 25mg  & is now taking Metoprolol 50mg 

## 2021-12-27 ENCOUNTER — Ambulatory Visit (INDEPENDENT_AMBULATORY_CARE_PROVIDER_SITE_OTHER): Payer: Medicare HMO | Admitting: Internal Medicine

## 2021-12-27 ENCOUNTER — Encounter: Payer: Self-pay | Admitting: Internal Medicine

## 2021-12-27 ENCOUNTER — Other Ambulatory Visit: Payer: Self-pay

## 2021-12-27 VITALS — BP 139/77 | HR 69 | Temp 98.4°F | Ht 70.87 in | Wt 249.0 lb

## 2021-12-27 DIAGNOSIS — E1169 Type 2 diabetes mellitus with other specified complication: Secondary | ICD-10-CM

## 2021-12-27 DIAGNOSIS — I4821 Permanent atrial fibrillation: Secondary | ICD-10-CM | POA: Diagnosis not present

## 2021-12-27 DIAGNOSIS — E785 Hyperlipidemia, unspecified: Secondary | ICD-10-CM | POA: Diagnosis not present

## 2021-12-27 NOTE — Progress Notes (Signed)
BP 139/77    Pulse 69    Temp 98.4 F (36.9 C) (Oral)    Ht 5' 10.87" (1.8 m)    Wt 249 lb (112.9 kg)    SpO2 97%    BMI 34.86 kg/m    Subjective:    Patient ID: Ernest [name], male    DOB: 10-Mar-1951, 71 y.o.   MRN: [id number]  Chief Complaint  Patient presents with   Hypertension    HPI: Ernest [name] is a 71 y.o. male  Hypertension This is a chronic problem. Pertinent negatives include no blurred vision or chest pain.  Diabetes He presents for his follow-up diabetic visit. He has type 2 diabetes mellitus. Pertinent negatives for diabetes include no blurred vision, no chest pain, no fatigue, no foot paresthesias, no foot ulcerations, no polydipsia, no polyphagia and no polyuria.   Chief Complaint  Patient presents with   Hypertension    Relevant past medical, surgical, family and social history reviewed and updated as indicated. Interim medical history since our last visit reviewed. Allergies and medications reviewed and updated.  Review of Systems  Constitutional:  Negative for fatigue.  Eyes:  Negative for blurred vision.  Cardiovascular:  Negative for chest pain.  Endocrine: Negative for polydipsia, polyphagia and polyuria.   Per HPI unless specifically indicated above     Objective:    BP 139/77    Pulse 69    Temp 98.4 F (36.9 C) (Oral)    Ht 5' 10.87" (1.8 m)    Wt 249 lb (112.9 kg)    SpO2 97%    BMI 34.86 kg/m   Wt Readings from Last 3 Encounters:  12/27/21 249 lb (112.9 kg)  12/20/21 247 lb 8 oz (112.3 kg)  12/13/21 246 lb 6.4 oz (111.8 kg)    Physical Exam Vitals and nursing note reviewed.  Constitutional:      General: He is not in acute distress.    Appearance: Normal appearance. He is not ill-appearing or diaphoretic.  HENT:     Head: Normocephalic and atraumatic.     Right Ear: Tympanic membrane and external ear normal. There is no impacted cerumen.     Left Ear: External ear normal.     Nose: No congestion or rhinorrhea.      Mouth/Throat:     Pharynx: No oropharyngeal exudate or posterior oropharyngeal erythema.  Eyes:     Conjunctiva/sclera: Conjunctivae normal.     Pupils: Pupils are equal, round, and reactive to light.  Cardiovascular:     Rate and Rhythm: Normal rate and regular rhythm.     Heart sounds: No murmur heard.   No friction rub. No gallop.  Pulmonary:     Effort: No respiratory distress.     Breath sounds: No stridor. No wheezing or rhonchi.  Chest:     Chest wall: No tenderness.  Abdominal:     General: Abdomen is flat. Bowel sounds are normal.     Palpations: Abdomen is soft. There is no mass.     Tenderness: There is no abdominal tenderness.  Musculoskeletal:     Cervical back: Normal range of motion and neck supple. No rigidity or tenderness.     Left lower leg: No edema.  Skin:    General: Skin is warm and dry.  Neurological:     Mental Status: He is alert.    Results for orders placed or performed in visit on 12/05/21  Microalbumin, Urine Waived  Result Value Ref Range  Microalb, Ur Waived 80 (H) 0 - 19 mg/L   Creatinine, Urine Waived 200 10 - 300 mg/dL   Microalb/Creat Ratio 30-300 (H) <30 mg/g  Lipid panel  Result Value Ref Range   Cholesterol, Total 73 (L) 100 - 199 mg/dL   Triglycerides 158 (H) 0 - 149 mg/dL   HDL 24 (L) >39 mg/dL   VLDL Cholesterol Cal 27 5 - 40 mg/dL   LDL Chol Calc (NIH) 22 0 - 99 mg/dL   Chol/HDL Ratio 3.0 0.0 - 5.0 ratio  Bayer DCA Hb A1c Waived  Result Value Ref Range   HB A1C (BAYER DCA - WAIVED) 6.2 (H) 4.8 - 5.6 %  CMP14+EGFR  Result Value Ref Range   Glucose 115 (H) 70 - 99 mg/dL   BUN 23 8 - 27 mg/dL   Creatinine, Ser 1.22 0.76 - 1.27 mg/dL   eGFR 63 >59 mL/min/1.73   BUN/Creatinine Ratio 19 10 - 24   Sodium 141 134 - 144 mmol/L   Potassium 4.2 3.5 - 5.2 mmol/L   Chloride 101 96 - 106 mmol/L   CO2 25 20 - 29 mmol/L   Calcium 9.5 8.6 - 10.2 mg/dL   Total Protein 6.8 6.0 - 8.5 g/dL   Albumin 4.5 3.7 - 4.7 g/dL   Globulin, Total  2.3 1.5 - 4.5 g/dL   Albumin/Globulin Ratio 2.0 1.2 - 2.2   Bilirubin Total 1.4 (H) 0.0 - 1.2 mg/dL   Alkaline Phosphatase 90 44 - 121 IU/L   AST 19 0 - 40 IU/L   ALT 15 0 - 44 IU/L  Thyroid Panel With TSH  Result Value Ref Range   TSH 2.960 0.450 - 4.500 uIU/mL   T4, Total 7.6 4.5 - 12.0 ug/dL   T3 Uptake Ratio 32 24 - 39 %   Free Thyroxine Index 2.4 1.2 - 4.9  PSA  Result Value Ref Range   Prostate Specific Ag, Serum 0.7 0.0 - 4.0 ng/mL        Current Outpatient Medications:    ACCU-CHEK GUIDE test strip, , Disp: , Rfl:    albuterol (VENTOLIN HFA) 108 (90 Base) MCG/ACT inhaler, Inhale 2 puffs into the lungs every 4 (four) hours as needed for wheezing or shortness of breath. Uses 2-3 times daily, Disp: 1 each, Rfl: 4   atorvastatin (LIPITOR) 80 MG tablet, TAKE 1 TABLET(80 MG) BY MOUTH DAILY, Disp: 90 tablet, Rfl: 1   clotrimazole-betamethasone (LOTRISONE) cream, Apply 1 application topically 2 (two) times daily., Disp: 30 g, Rfl: 2   dapagliflozin propanediol (FARXIGA) 10 MG TABS tablet, Take 1 tablet (10 mg total) by mouth daily., Disp: 30 tablet, Rfl: 6   ezetimibe (ZETIA) 10 MG tablet, Take 1 tablet (10 mg total) by mouth daily., Disp: 90 tablet, Rfl: 3   Fluticasone-Salmeterol (ADVAIR) 100-50 MCG/DOSE AEPB, Inhale 1 puff into the lungs 2 (two) times daily., Disp: 1 each, Rfl: 3   furosemide (LASIX) 40 MG tablet, Take 0.5 tablets (20 mg total) by mouth 2 (two) times daily., Disp: 180 tablet, Rfl: 1   gabapentin (NEURONTIN) 600 MG tablet, TAKE 2 TABLETS BY MOUTH AT BEDTIME, Disp: 180 tablet, Rfl: 0   isosorbide mononitrate (IMDUR) 60 MG 24 hr tablet, TAKE 1 TABLET(60 MG) BY MOUTH DAILY, Disp: 30 tablet, Rfl: 3   metFORMIN (GLUCOPHAGE-XR) 500 MG 24 hr tablet, Take 1 tablet (500 mg total) by mouth in the morning and at bedtime. 1 tab bid, Disp: 60 tablet, Rfl: 5   metoprolol succinate (TOPROL-XL)  50 MG 24 hr tablet, Take 50 mg by mouth daily., Disp: , Rfl:    nitroGLYCERIN  (NITROSTAT) 0.4 MG SL tablet, Place 1 tablet (0.4 mg total) under the tongue every 5 (five) minutes as needed for chest pain., Disp: 25 tablet, Rfl: 2   pantoprazole (PROTONIX) 40 MG tablet, TAKE 1 TABLET(40 MG) BY MOUTH DAILY, Disp: 90 tablet, Rfl: 2   ramipril (ALTACE) 10 MG capsule, TAKE 1 CAPSULE(10 MG) BY MOUTH DAILY, Disp: 90 capsule, Rfl: 0   rivaroxaban (XARELTO) 20 MG TABS tablet, Take 1 tablet (20 mg total) by mouth daily with supper., Disp: 30 tablet, Rfl: 3   diltiazem (CARDIZEM CD) 180 MG 24 hr capsule, Take 1 capsule (180 mg total) by mouth daily., Disp: 90 capsule, Rfl: 3     Assessment & Plan:  Afib/ CAD PCI to LAD 03/07/19 Afib supposed to be on lifelong anticoagulaiton is on xarelto better covered by insurance.     HTN :  Continue current meds.  Medication compliance emphasised. pt advised to keep Bp logs. Pt verbalised understanding of the same. Pt to have a low salt diet . Exercise to reach a goal of at least 150 mins a week.  lifestyle modifications explained and pt understands importance of the above.   HLD continue lipitor and zetia LDL at goal recheck FLP, check LFT's work on diet, SE of meds explained to pt. low fat and high fiber diet explained to pt.    DM stable is on metformin for such  check HbA1c,  urine  microalbumin  diabetic diet plan given to pt  adviced regarding hypoglycemia and instructions given to pt today on how to prevent and treat the same if it were to occur. pt acknowledges the plan and voices understanding of the same.  exercise plan given and encouraged.   advice diabetic yearly podiatry, ophthalmology , nutritionist , dental check q 6 months,  Problem List Items Addressed This Visit       Cardiovascular and Mediastinum   Atrial fibrillation ([place])     Endocrine   Hyperlipidemia associated with type 2 diabetes mellitus ([place]) - Primary   Type 2 diabetes mellitus with morbid obesity ([place])     No orders of the defined types were placed in  this encounter.    No orders of the defined types were placed in this encounter.    Follow up plan: Return in about 3 months (around 03/27/2022).

## 2022-02-01 ENCOUNTER — Other Ambulatory Visit: Payer: Self-pay | Admitting: *Deleted

## 2022-02-01 DIAGNOSIS — D500 Iron deficiency anemia secondary to blood loss (chronic): Secondary | ICD-10-CM

## 2022-02-06 ENCOUNTER — Telehealth: Payer: Self-pay | Admitting: Internal Medicine

## 2022-02-06 NOTE — Telephone Encounter (Signed)
Copied from [place] [id number]. Topic: General - Other >> Feb 06, 2022  3:47 PM Loma [place] wrote: Reason for CRM: Bernadene Person nurse fu as has notice that pt is not taking meds, getting refills as scheduled 3 are ramipril (ALTACE) 10 MG capsule 90 capsule 0 06/06/2021   Sig: TAKE 1 CAPSULE(10 MG) BY MOUTH DAILY  Sent to pharmacy as: ramipril (ALTACE) 10 MG capsule  E-Prescribing Status: Receipt confirmed by pharmacy (06/06/2021 7:26 AM EDT  ramipril (ALTACE) 10 MG capsule 90 capsule 0 06/06/2021   Sig: TAKE 1 CAPSULE(10 MG) BY MOUTH DAILY  Sent to pharmacy as: ramipril (ALTACE) 10 MG capsule  E-Prescribing Status: Receipt confirmed by pharmacy (06/06/2021 7:26 AM EDT  ramipril (ALTACE) 10 MG capsule 90 capsule 0 06/06/2021   Sig: TAKE 1 CAPSULE(10 MG) BY MOUTH DAILY  Sent to pharmacy as: ramipril (ALTACE) 10 MG capsule  E-Prescribing Status: Receipt confirmed by pharmacy (06/06/2021 7:26 AM EDT   Caller [employer] Artist Pais [phone]

## 2022-02-10 ENCOUNTER — Telehealth: Payer: Self-pay | Admitting: Oncology

## 2022-02-10 ENCOUNTER — Inpatient Hospital Stay: Payer: Medicare HMO

## 2022-02-10 ENCOUNTER — Inpatient Hospital Stay: Payer: Medicare HMO | Admitting: Oncology

## 2022-02-10 NOTE — Telephone Encounter (Signed)
Pt called and stated he was not feeling well and needed to cancel appt for today. I told him someone would be in touch to reschedule that appt with him.

## 2022-02-10 NOTE — Telephone Encounter (Signed)
Left VM for patient to return call so that we can reschedule his cancelled appointment on 2.24.

## 2022-02-15 ENCOUNTER — Ambulatory Visit (INDEPENDENT_AMBULATORY_CARE_PROVIDER_SITE_OTHER): Payer: Medicare HMO | Admitting: *Deleted

## 2022-02-15 DIAGNOSIS — Z0000 Encounter for general adult medical examination without abnormal findings: Secondary | ICD-10-CM | POA: Diagnosis not present

## 2022-02-15 NOTE — Progress Notes (Signed)
Subjective:   Ernest [name] is a 71 y.o. male who presents for Medicare Annual/Subsequent preventive examination.  I connected with  [name] on 02/15/22 by a telephone enabled telemedicine application and verified that I am speaking with the correct person using two identifiers.   I discussed the limitations of evaluation and management by telemedicine. The patient expressed understanding and agreed to proceed.  Patient location: home  Provider location: tele-health not in office     Review of Systems     Cardiac Risk Factors include: advanced age (>5men, >20 women);diabetes mellitus;obesity (BMI >30kg/m2);sedentary lifestyle     Objective:    Today's Vitals   There is no height or weight on file to calculate BMI.  Advanced Directives 08/19/2021 08/09/2021 05/23/2021 04/11/2021 04/04/2021 02/14/2021 04/02/2020  Does Patient Have a Medical Advance Directive? No No No No No No No  Would patient like information on creating a medical advance directive? No - Patient declined Yes (MAU/Ambulatory/Procedural Areas - Information given) - No - Patient declined Yes (MAU/Ambulatory/Procedural Areas - Information given) - Yes (MAU/Ambulatory/Procedural Areas - Information given)    Current Medications (verified) Outpatient Encounter Medications as of 02/15/2022  Medication Sig   ACCU-CHEK GUIDE test strip    albuterol (VENTOLIN HFA) 108 (90 Base) MCG/ACT inhaler Inhale 2 puffs into the lungs every 4 (four) hours as needed for wheezing or shortness of breath. Uses 2-3 times daily   atorvastatin (LIPITOR) 80 MG tablet TAKE 1 TABLET(80 MG) BY MOUTH DAILY   clotrimazole-betamethasone (LOTRISONE) cream Apply 1 application topically 2 (two) times daily.   dapagliflozin propanediol (FARXIGA) 10 MG TABS tablet Take 1 tablet (10 mg total) by mouth daily.   diltiazem (CARDIZEM CD) 180 MG 24 hr capsule Take 1 capsule (180 mg total) by mouth daily.   ezetimibe (ZETIA) 10 MG tablet Take 1  tablet (10 mg total) by mouth daily.   Fluticasone-Salmeterol (ADVAIR) 100-50 MCG/DOSE AEPB Inhale 1 puff into the lungs 2 (two) times daily.   furosemide (LASIX) 40 MG tablet Take 0.5 tablets (20 mg total) by mouth 2 (two) times daily.   gabapentin (NEURONTIN) 600 MG tablet TAKE 2 TABLETS BY MOUTH AT BEDTIME   isosorbide mononitrate (IMDUR) 60 MG 24 hr tablet TAKE 1 TABLET(60 MG) BY MOUTH DAILY   metFORMIN (GLUCOPHAGE-XR) 500 MG 24 hr tablet Take 1 tablet (500 mg total) by mouth in the morning and at bedtime. 1 tab bid   metoprolol succinate (TOPROL-XL) 50 MG 24 hr tablet Take 50 mg by mouth daily.   nitroGLYCERIN (NITROSTAT) 0.4 MG SL tablet Place 1 tablet (0.4 mg total) under the tongue every 5 (five) minutes as needed for chest pain.   pantoprazole (PROTONIX) 40 MG tablet TAKE 1 TABLET(40 MG) BY MOUTH DAILY   ramipril (ALTACE) 10 MG capsule TAKE 1 CAPSULE(10 MG) BY MOUTH DAILY   rivaroxaban (XARELTO) 20 MG TABS tablet Take 1 tablet (20 mg total) by mouth daily with supper.   No facility-administered encounter medications on file as of 02/15/2022.    Allergies (verified) Doxycycline   History: Past Medical History:  Diagnosis Date   A-fib ([place])    Anemia    Asthma    COPD (chronic obstructive pulmonary disease) (HCC)    Diabetes mellitus    Type II   Early Pulmonary fibrosis (HCC)    GERD (gastroesophageal reflux disease)    History of echocardiogram    a. 02/2017 Echo: EF 60-65%, no rwma, mild MR, mod dil LA. Nl RV  fxn. PASP 66mmHg.   Hyperlipidemia    Hypertension    Morbid obesity ([place])    Myocardial infarction [facility])    "light"   Non-obstructive CAD (coronary artery disease)    a. 2011 Cath: nonobs dzs; b. 07/2014 Cath: LM 30, LAD nl, LCX nl, RCA 20p, 35m.   Permanent atrial fibrillation (HCC)    a. CHA2DS2VASc = 4-->xarelto.   Polyp of sigmoid colon    Past Surgical History:  Procedure Laterality Date   ANKLE SURGERY     CARDIAC CATHETERIZATION  05/2010   [facility]    CARDIAC [facility]IZATION     [facility]   CARDIAC CATHETERIZATION     [facility]   CARDIAC CATHETERIZATION  07/2014   [place]   COLONOSCOPY     COLONOSCOPY WITH PROPOFOL N/A 06/26/2018   Procedure: COLONOSCOPY WITH PROPOFOL;  Surgeon: Virgel Manifold, MD;  Location: [facility] ENDOSCOPY;  Service: Endoscopy;  Laterality: N/A;   COLONOSCOPY WITH PROPOFOL N/A 05/23/2021   Procedure: COLONOSCOPY WITH PROPOFOL;  Surgeon: Virgel Manifold, MD;  Location: [facility] ENDOSCOPY;  Service: Endoscopy;  Laterality: N/A;   CORONARY ARTERY BYPASS GRAFT     CORONARY STENT INTERVENTION N/A 03/07/2019   Procedure: CORONARY STENT INTERVENTION;  Surgeon: Nelva Bush, MD;  Location: [place] CV LAB;  Service: Cardiovascular;  Laterality: N/A;   ENTEROSCOPY N/A 08/05/2018   Procedure: ENTEROSCOPY;  Surgeon: Virgel Manifold, MD;  Location: [facility] ENDOSCOPY;  Service: Endoscopy;  Laterality: N/A;   ESOPHAGOGASTRODUODENOSCOPY (EGD) WITH PROPOFOL N/A 06/26/2018   Procedure: ESOPHAGOGASTRODUODENOSCOPY (EGD) WITH PROPOFOL;  Surgeon: Virgel Manifold, MD;  Location: [facility] ENDOSCOPY;  Service: Endoscopy;  Laterality: N/A;   ESOPHAGOGASTRODUODENOSCOPY (EGD) WITH PROPOFOL N/A 05/23/2021   Procedure: PUSH ESOPHAGOGASTRODUODENOSCOPY (EGD) WITH PROPOFOL;  Surgeon: Virgel Manifold, MD;  Location: [facility] ENDOSCOPY;  Service: Endoscopy;  Laterality: N/A;   LEFT HEART CATH AND CORONARY ANGIOGRAPHY N/A 03/07/2019   Procedure: LEFT HEART CATH AND CORONARY ANGIOGRAPHY;  Surgeon: Nelva Bush, MD;  Location: [place] CV LAB;  Service: Cardiovascular;  Laterality: N/A;   TESTICLE SURGERY  70's   TUMOR EXCISION     Neck and finger; benign   WRIST SURGERY     Family History  Problem Relation Age of Onset   Hypertension Mother    Hyperlipidemia Mother    Diabetes Mother    Heart disease Mother        CABG   Pneumonia Father        rare type   Diabetes Other    Depression Other    Coronary artery disease Other    Alcohol  abuse Other    Hypertension Other    Hyperlipidemia Other    Cancer Brother    Kidney Stones Brother    Social History   Socioeconomic History   Marital status: Widowed    Spouse name: Not on file   Number of children: Not on file   Years of education: Not on file   Highest education level: 8th grade  Occupational History   Occupation: part time   Tobacco Use   Smoking status: Former    Packs/day: 4.00    Years: 28.00    Pack years: 112.00    Types: Cigarettes    Quit date: 08/25/1993    Years since quitting: 28.4   Smokeless tobacco: Never  Vaping Use   Vaping Use: Never used  Substance and Sexual Activity   Alcohol use: Not Currently   Drug use: No   Sexual activity: Not  Currently  Other Topics Concern   Not on file  Social History Narrative   No regular exercise.   Social Determinants of Health   Financial Resource Strain: Low Risk    Difficulty of Paying Living Expenses: Not hard at all  Food Insecurity: Food Insecurity Present   Worried About Charity fundraiser in the Last Year: Often true   Arboriculturist in the Last Year: Often true  Transportation Needs: No Transportation Needs   Lack of Transportation (Medical): No   Lack of Transportation (Non-Medical): No  Physical Activity: Inactive   Days of Exercise per Week: 0 days   Minutes of Exercise per Session: 0 min  Stress: Stress Concern Present   Feeling of Stress : To some extent  Social Connections: Socially Isolated   Frequency of Communication with Friends and Family: Twice a week   Frequency of Social Gatherings with Friends and Family: Twice a week   Attends Religious Services: Never   Marine scientist or Organizations: No   Attends Archivist Meetings: Never   Marital Status: Widowed    Tobacco Counseling Counseling given: Not Answered   Clinical Intake:  Pre-visit preparation completed: Yes  Pain : No/denies pain     Nutritional Risks: None Diabetes: Yes CBG  done?: No Did pt. bring in CBG monitor from home?: No  How often do you need to have someone help you when you read instructions, pamphlets, or other written materials from your doctor or pharmacy?: 1 - Never  Diabetic?  Yes  Nutrition Risk Assessment:  Has the patient had any N/V/D within the last 2 months?  No  Does the patient have any non-healing wounds?  No  Has the patient had any unintentional weight loss or weight gain?  No   Diabetes:  Is the patient diabetic?  Yes  If diabetic, was a CBG obtained today?  No  Did the patient bring in their glucometer from home?  No  How often do you monitor your CBG's? 1 x a month.   Financial Strains and Diabetes Management:  Are you having any financial strains with the device, your supplies or your medication? No .  Does the patient want to be seen by Chronic Care Management for management of their diabetes?  No  Would the patient like to be referred to a Nutritionist or for Diabetic Management?  No   Diabetic Exams:  Diabetic Eye Exam: . Overdue for diabetic eye exam. Pt has been advised about the importance in completing this exam. A referral has been placed today.  Diabetic Foot Exam:  Pt has been advised about the importance in completing this exam  Interpreter Needed?: No  Information entered by :: Leroy Kennedy LPN   Activities of Daily Living In your present state of health, do you have any difficulty performing the following activities: 02/15/2022  Hearing? Y  Vision? N  Difficulty concentrating or making decisions? N  Walking or climbing stairs? N  Dressing or bathing? N  Doing errands, shopping? N  Preparing Food and eating ? N  Using the Toilet? N  In the past six months, have you accidently leaked urine? N  Do you have problems with loss of bowel control? N  Managing your Medications? N  Managing your Finances? N  Housekeeping or managing your Housekeeping? N  Some recent data might be hidden    Patient Care  Team: [name], MD as PCP - General Rockey Situ Kathlene November, MD  as PCP - Cardiology (Cardiology) Vanita Ingles, RN as Case Manager (General Practice) Vladimir Faster, [facility] (Inactive) (Pharmacist) Minna Merritts, MD as Consulting Physician (Cardiology)  Indicate any recent Medical Services you may have received from other than [facility] providers in the past year (date may be approximate).     Assessment:   This is a routine wellness examination for [employer].  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 Vision Screening - Comments:: Not up date [facility]  Dietary issues and exercise activities discussed: Current Exercise Habits: The patient does not participate in regular exercise at present, Intensity: Not Applicable, Exercise limited by: None identified   Goals Addressed             This Visit's Progress    Patient Stated       No goals       Depression Screen PHQ 2/9 Scores 02/15/2022 12/13/2021 09/12/2021 06/07/2021 05/04/2021 03/11/2021 02/14/2021  PHQ - 2 Score 0 0 0 0 0 0 0  PHQ- 9 Score - 0 0 - - 0 -    Fall Risk Fall Risk  02/15/2022 12/13/2021 09/12/2021 06/07/2021 05/04/2021  Falls in the past year? 0 0 0 0 0  Comment - - - - -  Number falls in past yr: 0 0 0 0 0  Injury with Fall? 0 0 0 0 0  Risk Factor Category  - - - - -  Risk for fall due to : - - No Fall Risks No Fall Risks -  Follow up Falls evaluation completed;Education provided;Falls prevention discussed Falls evaluation completed Falls evaluation completed Falls evaluation completed Falls evaluation completed    FALL RISK PREVENTION PERTAINING TO THE HOME:  Any stairs in or around the home? Yes  If so, are there any without handrails? No  Home free of loose throw rugs in walkways, pet beds, electrical cords, etc? Yes  Adequate lighting in your home to reduce risk of falls? Yes   ASSISTIVE DEVICES UTILIZED TO PREVENT FALLS:  Life alert? No  Use of a cane,  walker or w/c? No  Grab bars in the bathroom? No  Shower chair or bench in shower? No  Elevated toilet seat or a handicapped toilet? No   TIMED UP AND GO:  Was the test performed? No .    Cognitive Function:  Normal cognitive status assessed by direct observation by this Nurse Health Advisor. No abnormalities found.       6CIT Screen 02/14/2021 02/03/2019 02/01/2018  What Year? 0 points 0 points 0 points  What month? 0 points 0 points 0 points  What time? 0 points 0 points 0 points  Count back from 20 0 points 0 points 0 points  Months in reverse 0 points 0 points 0 points  Repeat phrase 4 points 2 points 2 points  Total Score 4 2 2     Immunizations Immunization History  Administered Date(s) Administered   Fluad Quad(high Dose 65+) 11/21/2019, 11/19/2020, 09/12/2021   Influenza Split 08/25/2013   Influenza, High Dose Seasonal PF 09/20/2016, 11/02/2017, 10/10/2018   Influenza,inj,Quad PF,6+ Mos 11/17/2015   Influenza-Unspecified 12/23/2014   PFIZER(Purple Top)SARS-COV-2 Vaccination 11/22/2020, 12/13/2020   Pneumococcal Conjugate-13 11/25/2015   Pneumococcal Polysaccharide-23 10/25/2013, 02/03/2019   Pneumococcal-Unspecified 10/26/2009   Tdap 11/06/2012   Zoster, Live 02/04/2013    TDAP status: Up to date  Flu Vaccine status: Up to date  Pneumococcal vaccine status: Up to date  Covid-19 vaccine status: Information provided  on how to obtain vaccines.   Qualifies for Shingles Vaccine? Yes   Zostavax completed Yes   Shingrix Completed?: No.    Education has been provided regarding the importance of this vaccine. Patient has been advised to call insurance company to determine out of pocket expense if they have not yet received this vaccine. Advised may also receive vaccine at local pharmacy or Health Dept. Verbalized acceptance and understanding.  Screening Tests Health Maintenance  Topic Date Due   OPHTHALMOLOGY EXAM  04/20/2019   COVID-19 Vaccine (3 - Pfizer risk  series) 01/10/2021   FOOT EXAM  04/12/2021   Zoster Vaccines- Shingrix (1 of 2) 03/13/2022 (Originally 05/06/1970)   HEMOGLOBIN A1C  06/05/2022   TETANUS/TDAP  11/06/2022   COLONOSCOPY (Pts 45-83yrs Insurance coverage will need to be confirmed)  05/23/2024   Pneumonia Vaccine 62+ Years old  Completed   INFLUENZA VACCINE  Completed   Hepatitis C Screening  Completed   HPV VACCINES  Aged Out    Health Maintenance  Health Maintenance Due  Topic Date Due   OPHTHALMOLOGY EXAM  04/20/2019   COVID-19 Vaccine (3 - Pfizer risk series) 01/10/2021   FOOT EXAM  04/12/2021    Colorectal cancer screening: Type of screening: Colonoscopy. Completed 2022. Repeat every 0 years  Lung Cancer Screening: (Low Dose CT Chest recommended if Age 14-80 years, 30 pack-year currently smoking OR have quit w/in 15years.) does not qualify.   Lung Cancer Screening Referral:   Additional Screening:  Hepatitis C Screening: does not qualify; Completed 2016  Vision Screening: Recommended annual ophthalmology exams for early detection of glaucoma and other disorders of the eye. Is the patient up to date with their annual eye exam?  No  Who is the provider or what is the name of the office in which the patient attends annual eye exams? [facility] If pt is not established with a provider, would they like to be referred to a provider to establish care? No .   Dental Screening: Recommended annual dental exams for proper oral hygiene  Community Resource Referral / Chronic Care Management: CRR required this visit?  No   CCM required this visit?  No      Plan:     I have personally reviewed and noted the following in the patients chart:   Medical and social history Use of alcohol, tobacco or illicit drugs  Current medications and supplements including opioid prescriptions. Patient is not currently taking opioid prescriptions. Functional ability and status Nutritional status Physical activity Advanced  directives List of other physicians Hospitalizations, surgeries, and ER visits in previous 12 months Vitals Screenings to include cognitive, depression, and falls Referrals and appointments  In addition, I have reviewed and discussed with patient certain preventive protocols, quality metrics, and best practice recommendations. A written personalized care plan for preventive services as well as general preventive health recommendations were provided to patient.     Leroy Kennedy, LPN   04/24/8501   Nurse Notes:

## 2022-02-15 NOTE — Patient Instructions (Signed)
Ernest [name] , Thank you for taking time to come for your Medicare Wellness Visit. I appreciate your ongoing commitment to your health goals. Please review the following plan we discussed and let me know if I can assist you in the future.   Screening recommendations/referrals: Colonoscopy: up to date Recommended yearly ophthalmology/optometry visit for glaucoma screening and checkup Recommended yearly dental visit for hygiene and checkup  Vaccinations: Influenza vaccine: up to date Pneumococcal vaccine: up to date Tdap vaccine: up to date Shingles vaccine: Education provided    Advanced directives: Education provided    Next appointment: 03-28-2022 @ 10:20 [name]  Preventive Care 11 Years and Older, Male Preventive care refers to lifestyle choices and visits with your health care provider that can promote health and wellness. What does preventive care include? A yearly physical exam. This is also called an annual well check. Dental exams once or twice a year. Routine eye exams. Ask your health care provider how often you should have your eyes checked. Personal lifestyle choices, including: Daily care of your teeth and gums. Regular physical activity. Eating a healthy diet. Avoiding tobacco and drug use. Limiting alcohol use. Practicing safe sex. Taking low doses of aspirin every day. Taking vitamin and mineral supplements as recommended by your health care provider. What happens during an annual well check? The services and screenings done by your health care provider during your annual well check will depend on your age, overall health, lifestyle risk factors, and family history of disease. Counseling  Your health care provider may ask you questions about your: Alcohol use. Tobacco use. Drug use. Emotional well-being. Home and relationship well-being. Sexual activity. Eating habits. History of falls. Memory and ability to understand (cognition). Work and work  Statistician. Screening  You may have the following tests or measurements: Height, weight, and BMI. Blood pressure. Lipid and cholesterol levels. These may be checked every 5 years, or more frequently if you are over 31 years old. Skin check. Lung cancer screening. You may have this screening every year starting at age 33 if you have a 30-pack-year history of smoking and currently smoke or have quit within the past 15 years. Fecal occult blood test (FOBT) of the stool. You may have this test every year starting at age 7. Flexible sigmoidoscopy or colonoscopy. You may have a sigmoidoscopy every 5 years or a colonoscopy every 10 years starting at age 46. Prostate cancer screening. Recommendations will vary depending on your family history and other risks. Hepatitis C blood test. Hepatitis B blood test. Sexually transmitted disease (STD) testing. Diabetes screening. This is done by checking your blood sugar (glucose) after you have not eaten for a while (fasting). You may have this done every 1-3 years. Abdominal aortic aneurysm (AAA) screening. You may need this if you are a current or former smoker. Osteoporosis. You may be screened starting at age 51 if you are at high risk. Talk with your health care provider about your test results, treatment options, and if necessary, the need for more tests. Vaccines  Your health care provider may recommend certain vaccines, such as: Influenza vaccine. This is recommended every year. Tetanus, diphtheria, and acellular pertussis (Tdap, Td) vaccine. You may need a Td booster every 10 years. Zoster vaccine. You may need this after age 9. Pneumococcal 13-valent conjugate (PCV13) vaccine. One dose is recommended after age 5. Pneumococcal polysaccharide (PPSV23) vaccine. One dose is recommended after age 52. Talk to your health care provider about which screenings and vaccines you need  and how often you need them. This information is not intended to replace  advice given to you by your health care provider. Make sure you discuss any questions you have with your health care provider. Document Released: 12/31/2015 Document Revised: 08/23/2016 Document Reviewed: 10/05/2015 Elsevier Interactive Patient Education  2017 [employer] Prevention in the Home Falls can cause injuries. They can happen to people of all ages. There are many things you can do to make your home safe and to help prevent falls. What can I do on the outside of my home? Regularly fix the edges of walkways and driveways and fix any cracks. Remove anything that might make you trip as you walk through a door, such as a raised step or threshold. Trim any bushes or trees on the path to your home. Use bright outdoor lighting. Clear any walking paths of anything that might make someone trip, such as rocks or tools. Regularly check to see if handrails are loose or broken. Make sure that both sides of any steps have handrails. Any raised decks and porches should have guardrails on the edges. Have any leaves, snow, or ice cleared regularly. Use sand or salt on walking paths during winter. Clean up any spills in your garage right away. This includes oil or grease spills. What can I do in the bathroom? Use night lights. Install grab bars by the toilet and in the tub and shower. Do not use towel bars as grab bars. Use non-skid mats or decals in the tub or shower. If you need to sit down in the shower, use a plastic, non-slip stool. Keep the floor dry. Clean up any water that spills on the floor as soon as it happens. Remove soap buildup in the tub or shower regularly. Attach bath mats securely with double-sided non-slip rug tape. Do not have throw rugs and other things on the floor that can make you trip. What can I do in the bedroom? Use night lights. Make sure that you have a light by your bed that is easy to reach. Do not use any sheets or blankets that are too big for your bed.  They should not hang down onto the floor. Have a firm chair that has side arms. You can use this for support while you get dressed. Do not have throw rugs and other things on the floor that can make you trip. What can I do in the kitchen? Clean up any spills right away. Avoid walking on wet floors. Keep items that you use a lot in easy-to-reach places. If you need to reach something above you, use a strong step stool that has a grab bar. Keep electrical cords out of the way. Do not use floor polish or wax that makes floors slippery. If you must use wax, use non-skid floor wax. Do not have throw rugs and other things on the floor that can make you trip. What can I do with my stairs? Do not leave any items on the stairs. Make sure that there are handrails on both sides of the stairs and use them. Fix handrails that are broken or loose. Make sure that handrails are as long as the stairways. Check any carpeting to make sure that it is firmly attached to the stairs. Fix any carpet that is loose or worn. Avoid having throw rugs at the top or bottom of the stairs. If you do have throw rugs, attach them to the floor with carpet tape. Make sure that you  have a light switch at the top of the stairs and the bottom of the stairs. If you do not have them, ask someone to add them for you. What else can I do to help prevent falls? Wear shoes that: Do not have high heels. Have rubber bottoms. Are comfortable and fit you well. Are closed at the toe. Do not wear sandals. If you use a stepladder: Make sure that it is fully opened. Do not climb a closed stepladder. Make sure that both sides of the stepladder are locked into place. Ask someone to hold it for you, if possible. Clearly mark and make sure that you can see: Any grab bars or handrails. First and last steps. Where the edge of each step is. Use tools that help you move around (mobility aids) if they are needed. These  include: Canes. Walkers. Scooters. Crutches. Turn on the lights when you go into a dark area. Replace any light bulbs as soon as they burn out. Set up your furniture so you have a clear path. Avoid moving your furniture around. If any of your floors are uneven, fix them. If there are any pets around you, be aware of where they are. Review your medicines with your doctor. Some medicines can make you feel dizzy. This can increase your chance of falling. Ask your doctor what other things that you can do to help prevent falls. This information is not intended to replace advice given to you by your health care provider. Make sure you discuss any questions you have with your health care provider. Document Released: 09/30/2009 Document Revised: 05/11/2016 Document Reviewed: 01/08/2015 Elsevier Interactive Patient Education  2017 [employer].

## 2022-02-16 ENCOUNTER — Telehealth: Payer: Self-pay

## 2022-02-16 NOTE — Telephone Encounter (Signed)
? ?  Telephone encounter was:  Successful.  ?02/16/2022 ?Name: Malike Foglio MRN: [id number] DOB: September 26, 1951 ? ?Bufford Helms is a 71 y.o. year old male who is a primary care patient of [name], [name], MD . The community resource team was consulted for assistance with Food Insecurity, Home Modifications, and Financial Difficulties related to [place] ? ?Care guide performed the following interventions: Patient provided with information about care guide support team and interviewed to confirm resource needs.Patient called back stating he has no food and only $1 to his name I am providing him resources by phone as well as mailing resources ? ?Follow Up Plan:  Care guide will follow up with patient by phone over the next two weeks ? ? ? ? ? ?Larena Sox ?Care Guide, Embedded Care Coordination ?[place], Care Management  ?[phone] ?[address], [place], [place] [address] ?Phone: [phone] ?Email: [email or web address] ? ?  ?. ?

## 2022-02-16 NOTE — Telephone Encounter (Signed)
? ?  Telephone encounter was:  Unsuccessful.  02/16/2022 ?Name: Damyan Corne MRN: [id number] DOB: August 30, 1951 ? ?Unsuccessful outbound call made today to assist with:  Food Insecurity ? ?Outreach Attempt:  1st Attempt ? ?A HIPAA compliant voice message was left requesting a return call.  Instructed patient to call back at earliest convenience. ?. ? ? ? ?Larena Sox ?Care Guide, Embedded Care Coordination ?[place], Care Management  ?[phone] ?[address], [place], [place] [address] ?Phone: [phone] ?Email: [email or web address] ? ?  ?

## 2022-02-17 ENCOUNTER — Ambulatory Visit: Payer: Medicare HMO

## 2022-03-02 ENCOUNTER — Inpatient Hospital Stay: Payer: Medicare HMO | Attending: Oncology

## 2022-03-02 ENCOUNTER — Inpatient Hospital Stay: Payer: Medicare HMO | Admitting: Oncology

## 2022-03-07 ENCOUNTER — Ambulatory Visit: Payer: Medicare HMO | Admitting: Dermatology

## 2022-03-07 ENCOUNTER — Encounter: Payer: Self-pay | Admitting: Dermatology

## 2022-03-07 ENCOUNTER — Other Ambulatory Visit: Payer: Self-pay

## 2022-03-07 DIAGNOSIS — L578 Other skin changes due to chronic exposure to nonionizing radiation: Secondary | ICD-10-CM

## 2022-03-07 DIAGNOSIS — L821 Other seborrheic keratosis: Secondary | ICD-10-CM

## 2022-03-07 DIAGNOSIS — L820 Inflamed seborrheic keratosis: Secondary | ICD-10-CM

## 2022-03-07 DIAGNOSIS — L570 Actinic keratosis: Secondary | ICD-10-CM

## 2022-03-07 NOTE — Progress Notes (Signed)
? ?Follow-Up Visit ?  ?Subjective  ?Ernest [name] is a 71 y.o. male who presents for the following: Actinic Keratosis (AK (ears) and ISKs (arms). 3 month recheck. Hx LN2 treatment). ?The patient has spots, moles and lesions to be evaluated, some may be new or changing and the patient has concerns that these could be cancer. ? ?The following portions of the chart were reviewed this encounter and updated as appropriate:  Tobacco  Allergies  Meds  Problems  Med Hx  Surg Hx  Fam Hx   ?  ?Review of Systems: No other skin or systemic complaints except as noted in HPI or Assessment and Plan. ? ?Objective  ?Well appearing patient in no apparent distress; mood and affect are within normal limits. ? ?A focused examination was performed including face, ears, arms, hands. Relevant physical exam findings are noted in the Assessment and Plan. ? ?face, ears x 7 (7) ?Erythematous thin papules/macules with gritty scale.  ? ?arms and hands x24 (24) ?Erythematous keratotic or waxy stuck-on papule or plaque. ? ? ?Assessment & Plan  ? ?Seborrheic Keratoses ?- Stuck-on, waxy, tan-brown papules and/or plaques  ?- Benign-appearing ?- Discussed benign etiology and prognosis. ?- Observe ?- Call for any changes ? ?Actinic Damage ?- chronic, secondary to cumulative UV radiation exposure/sun exposure over time ?- diffuse scaly erythematous macules with underlying dyspigmentation ?- Recommend daily broad spectrum sunscreen SPF 30+ to sun-exposed areas, reapply every 2 hours as needed.  ?- Recommend staying in the shade or wearing long sleeves, sun glasses (UVA+UVB protection) and wide brim hats (4-inch brim around the entire circumference of the hat). ?- Call for new or changing lesions. ? ?AK (actinic keratosis) (7) ?face, ears x 7 ? ?Actinic keratoses are precancerous spots that appear secondary to cumulative UV radiation exposure/sun exposure over time. They are chronic with expected duration over 1 year. A portion of actinic  keratoses will progress to squamous cell carcinoma of the skin. It is not possible to reliably predict which spots will progress to skin cancer and so treatment is recommended to prevent development of skin cancer. ? ?Recommend daily broad spectrum sunscreen SPF 30+ to sun-exposed areas, reapply every 2 hours as needed.  ?Recommend staying in the shade or wearing long sleeves, sun glasses (UVA+UVB protection) and wide brim hats (4-inch brim around the entire circumference of the hat). ?Call for new or changing lesions. ? ?Destruction of lesion - face, ears x 7 ?Complexity: simple   ?Destruction method: cryotherapy   ?Informed consent: discussed and consent obtained   ?Timeout:  patient name, date of birth, surgical site, and procedure verified ?Lesion destroyed using liquid nitrogen: Yes   ?Region frozen until ice ball extended beyond lesion: Yes   ?Outcome: patient tolerated procedure well with no complications   ?Post-procedure details: wound care instructions given   ? ?Inflamed seborrheic keratosis (24) ?arms and hands x24 ? ?Destruction of lesion - arms and hands x24 ?Complexity: simple   ?Destruction method: cryotherapy   ?Informed consent: discussed and consent obtained   ?Timeout:  patient name, date of birth, surgical site, and procedure verified ?Lesion destroyed using liquid nitrogen: Yes   ?Region frozen until ice ball extended beyond lesion: Yes   ?Outcome: patient tolerated procedure well with no complications   ?Post-procedure details: wound care instructions given   ? ? ?Return in about 6 months (around 09/07/2022) for AK Follow Up, ISK Follow Up. ? ?I, Emelia Salisbury, CMA, am acting as scribe for Sarina Ser, MD. ?Documentation: I have  reviewed the above documentation for accuracy and completeness, and I agree with the above. ? ?Sarina Ser, MD ? ? ?

## 2022-03-07 NOTE — Patient Instructions (Addendum)
Cryotherapy Aftercare ? ?Wash gently with soap and water everyday.   ?Apply Vaseline and Band-Aid daily until healed.  ? ?Prior to procedure, discussed risks of blister formation, small wound, skin dyspigmentation, or rare scar following cryotherapy. Recommend Vaseline ointment to treated areas while healing.  ? ? ?If You Need Anything After Your Visit ? ?If you have any questions or concerns for your doctor, please call our main line at [phone] and press option 4 to reach your doctor's medical assistant. If no one answers, please leave a voicemail as directed and we will return your call as soon as possible. Messages left after 4 pm will be answered the following business day.  ? ?You may also send [place] a message via MyChart. We typically respond to MyChart messages within 1-2 business days. ? ?For prescription refills, please ask your pharmacy to contact our office. Our fax number is [phone]. ? ?If you have an urgent issue when the clinic is closed that cannot wait until the next business day, you can page your doctor at the number below.   ? ?Please note that while we do our best to be available for urgent issues outside of office hours, we are not available 24/7.  ? ?If you have an urgent issue and are unable to reach [place], you may choose to seek medical care at your doctor's office, retail clinic, urgent care center, or emergency room. ? ?If you have a medical emergency, please immediately call 911 or go to the emergency department. ? ?Pager Numbers ? ?- Dr. Nehemiah Massed: [phone] ? ?- Dr. Laurence Ferrari: [phone] ? ?- Dr. Nicole Kindred: [phone] ? ?In the event of inclement weather, please call our main line at [phone] for an update on the status of any delays or closures. ? ?Dermatology Medication Tips: ?Please keep the boxes that topical medications come in in order to help keep track of the instructions about where and how to use these. Pharmacies typically print the medication instructions only on the  boxes and not directly on the medication tubes.  ? ?If your medication is too expensive, please contact our office at [phone] option 4 or send [place] a message through [place].  ? ?We are unable to tell what your co-pay for medications will be in advance as this is different depending on your insurance coverage. However, we may be able to find a substitute medication at lower cost or fill out paperwork to get insurance to cover a needed medication.  ? ?If a prior authorization is required to get your medication covered by your insurance company, please allow [place] 1-2 business days to complete this process. ? ?Drug prices often vary depending on where the prescription is filled and some pharmacies may offer cheaper prices. ? ?The website www.goodrx.com contains coupons for medications through different pharmacies. The prices here do not account for what the cost may be with help from insurance (it may be cheaper with your insurance), but the website can give you the price if you did not use any insurance.  ?- You can print the associated coupon and take it with your prescription to the pharmacy.  ?- You may also stop by our office during regular business hours and pick up a GoodRx coupon card.  ?- If you need your prescription sent electronically to a different pharmacy, notify our office through [facility] or by phone at [phone] option 4. ? ? ? ? ?Si Usted Necesita Algo Despu?s de Su Visita ? ?Tambi?n puede enviarnos un mensaje a  trav?s de MyChart. Por lo general respondemos a los mensajes de MyChart en el transcurso de 1 a 2 d?as h?biles. ? ?Para renovar recetas, por favor pida a su farmacia que se ponga en contacto con nuestra oficina. Nuestro n?mero de fax es el [phone]. ? ?Si tiene un asunto urgente cuando la cl?nica est? cerrada y que no puede esperar hasta el siguiente d?a h?bil, puede llamar/localizar a su doctor(a) al n?mero que aparece a continuaci?n.  ? ?Por favor, tenga en cuenta que  aunque hacemos todo lo posible para estar disponibles para asuntos urgentes fuera del horario de oficina, no estamos disponibles las 24 horas del d?a, los 7 d?as de la semana.  ? ?Si tiene un problema urgente y no puede comunicarse con nosotros, puede optar por buscar atenci?n m?dica  en el consultorio de su doctor(a), en una cl?nica privada, en un centro de atenci?n urgente o en una sala de emergencias. ? ?Si tiene Engineer, maintenance (IT) m?dica, por favor llame inmediatamente al 911 o vaya a la sala de emergencias. ? ?N?meros de b?per ? ?- Dr. Nehemiah Massed: [phone] ? ?- Dra. Moye: [phone] ? ?- Dra. Nicole Kindred: [phone] ? ?En caso de inclemencias del tiempo, por favor llame a nuestra l?nea principal al [phone] para una actualizaci?n sobre el estado de cualquier retraso o cierre. ? ?Consejos para la medicaci?n en dermatolog?a: ?Por favor, guarde las cajas en las que vienen los medicamentos de uso t?pico para ayudarle a seguir las instrucciones sobre d?nde y c?mo usarlos. Las farmacias generalmente imprimen las instrucciones del medicamento s?lo en las cajas y no directamente en los tubos del [place].  ? ?Si su medicamento es muy caro, por favor, p?ngase en contacto con Zigmund Daniel llamando al [phone] y presione la opci?n 4 o env?enos un mensaje a trav?s de MyChart.  ? ?No podemos decirle cu?l ser? su copago por los medicamentos por adelantado ya que esto es diferente dependiendo de la cobertura de su seguro. Sin embargo, es posible que podamos encontrar un medicamento sustituto a Electrical engineer un formulario para que el seguro cubra el medicamento que se considera necesario.  ? ?Si se requiere Ardelia Mems autorizaci?n previa para que su compa??a de seguros [place] su medicamento, por favor perm?tanos de 1 a 2 d?as h?biles para completar este proceso. ? ?Los precios de los medicamentos var?an con frecuencia dependiendo del Environmental consultant de d?nde se surte la receta y alguna farmacias pueden ofrecer precios m?s  baratos. ? ?El sitio web www.goodrx.com tiene cupones para medicamentos de Airline pilot. Los precios aqu? no tienen en cuenta lo que podr?a costar con la ayuda del seguro (puede ser m?s barato con su seguro), pero el sitio web puede darle el precio si no utiliz? ning?n seguro.  ?- Puede imprimir el cup?n correspondiente y llevarlo con su receta a la farmacia.  ?- Tambi?n puede pasar por nuestra oficina durante el horario de atenci?n regular y recoger una tarjeta de cupones de GoodRx.  ?- Si necesita que su receta se env?e electr?nicamente a Chiropodist, informe a nuestra oficina a trav?s de MyChart de [place] o por tel?fono llamando al [phone] y presione la opci?n 4.  ?

## 2022-03-12 ENCOUNTER — Encounter: Payer: Self-pay | Admitting: Dermatology

## 2022-03-15 ENCOUNTER — Encounter: Payer: Self-pay | Admitting: Internal Medicine

## 2022-03-28 ENCOUNTER — Telehealth: Payer: Self-pay

## 2022-03-28 ENCOUNTER — Ambulatory Visit: Payer: Medicare HMO | Admitting: Internal Medicine

## 2022-03-28 NOTE — Telephone Encounter (Signed)
? ?  Telephone encounter was:  Successful.  ?03/28/2022 ?Name: Ernest [name] MRN: [id number] DOB: [birth year] ? ?Ernest [name] is a 71 y.o. year old male who is a primary care patient of [name], [name], MD . The community resource team was consulted for assistance with Food Insecurity ? ?Care guide performed the following interventions: Follow up call placed to the patient to discuss status of referral.follow up for mailed resources. patient did receive the resources ? ? ?Follow Up Plan:  No further follow up planned at this time. The patient has been provided with needed resources. ? ? ? ?Larena Sox ?Care Guide, Embedded Care Coordination ?[place], Care Management  ?[phone] ?[address], [place], [place] [address] ?Phone: [phone] ?Email: [email or web address] ? ?  ?

## 2022-04-03 ENCOUNTER — Other Ambulatory Visit: Payer: Self-pay | Admitting: Nurse Practitioner

## 2022-04-03 ENCOUNTER — Other Ambulatory Visit: Payer: Self-pay | Admitting: Medical

## 2022-04-03 ENCOUNTER — Encounter: Payer: Self-pay | Admitting: Internal Medicine

## 2022-04-03 ENCOUNTER — Other Ambulatory Visit: Payer: Self-pay | Admitting: Internal Medicine

## 2022-04-03 ENCOUNTER — Ambulatory Visit (INDEPENDENT_AMBULATORY_CARE_PROVIDER_SITE_OTHER): Payer: Medicare HMO | Admitting: Internal Medicine

## 2022-04-03 DIAGNOSIS — E1169 Type 2 diabetes mellitus with other specified complication: Secondary | ICD-10-CM

## 2022-04-03 DIAGNOSIS — R7989 Other specified abnormal findings of blood chemistry: Secondary | ICD-10-CM

## 2022-04-03 DIAGNOSIS — G2581 Restless legs syndrome: Secondary | ICD-10-CM

## 2022-04-03 LAB — BAYER DCA HB A1C WAIVED: HB A1C (BAYER DCA - WAIVED): 6.9 % — ABNORMAL HIGH (ref 4.8–5.6)

## 2022-04-03 MED ORDER — EZETIMIBE 10 MG PO TABS: 10.0000 mg | ORAL_TABLET | Freq: Every day | ORAL | 3 refills | Status: DC

## 2022-04-03 MED ORDER — DILTIAZEM HCL ER COATED BEADS 180 MG PO CP24: 180.0000 mg | ORAL_CAPSULE | Freq: Every day | ORAL | 3 refills | Status: DC

## 2022-04-03 NOTE — Progress Notes (Signed)
? ?BP (!) 143/79   Pulse 67   Temp 97.9 ?F (36.6 ?C) (Oral)   Wt 238 lb (108 kg)   SpO2 98%   BMI 33.32 kg/m?   ? ?Subjective:  ? ? Patient ID: Ernest [name], male    DOB: [birth year], 71 y.o.   MRN: [id number] ? ?Chief Complaint  ?Patient presents with  ? Diabetes  ? Hyperlipidemia  ? Hypertension  ? ? ?HPI: ?Ernest [name] is a 71 y.o. male ? ?Diabetes ?He presents for his follow-up (is on metformin 500 mg bid nad farxiga) diabetic visit. He has type 2 diabetes mellitus. His disease course has been improving. Pertinent negatives for diabetes include no blurred vision, no chest pain, no fatigue, no foot ulcerations, no polydipsia, no polyphagia, no visual change, no weakness and no weight loss.  ?Hyperlipidemia ?Pertinent negatives include no chest pain.  ?Hypertension ?Pertinent negatives include no blurred vision or chest pain.  ? ?Chief Complaint  ?Patient presents with  ? Diabetes  ? Hyperlipidemia  ? Hypertension  ? ? ?Relevant past medical, surgical, family and social history reviewed and updated as indicated. Interim medical history since our last visit reviewed. ?Allergies and medications reviewed and updated. ? ?Review of Systems  ?Constitutional:  Negative for fatigue and weight loss.  ?Eyes:  Negative for blurred vision.  ?Cardiovascular:  Negative for chest pain.  ?Endocrine: Negative for polydipsia and polyphagia.  ?Neurological:  Negative for weakness.  ? ?Per HPI unless specifically indicated above ? ?   ?Objective:  ?  ?BP (!) 143/79   Pulse 67   Temp 97.9 ?F (36.6 ?C) (Oral)   Wt 238 lb (108 kg)   SpO2 98%   BMI 33.32 kg/m?   ?Wt Readings from Last 3 Encounters:  ?04/03/22 238 lb (108 kg)  ?12/27/21 249 lb (112.9 kg)  ?12/20/21 247 lb 8 oz (112.3 kg)  ?  ?Physical Exam ? ?Results for orders placed or performed in visit on 04/03/22  ?Bayer DCA Hb A1c Waived  ?Result Value Ref Range  ? HB A1C (BAYER DCA - WAIVED) 6.9 (H) 4.8 - 5.6 %  ? ?   ? ? ?Current Outpatient Medications:  ?   ACCU-CHEK GUIDE test strip, , Disp: , Rfl:  ?  albuterol (VENTOLIN HFA) 108 (90 Base) MCG/ACT inhaler, Inhale 2 puffs into the lungs every 4 (four) hours as needed for wheezing or shortness of breath. Uses 2-3 times daily, Disp: 1 each, Rfl: 4 ?  atorvastatin (LIPITOR) 80 MG tablet, TAKE 1 TABLET(80 MG) BY MOUTH DAILY, Disp: 90 tablet, Rfl: 1 ?  clotrimazole-betamethasone (LOTRISONE) cream, Apply 1 application topically 2 (two) times daily., Disp: 30 g, Rfl: 2 ?  dapagliflozin propanediol (FARXIGA) 10 MG TABS tablet, Take 1 tablet (10 mg total) by mouth daily., Disp: 30 tablet, Rfl: 6 ?  Fluticasone-Salmeterol (ADVAIR) 100-50 MCG/DOSE AEPB, Inhale 1 puff into the lungs 2 (two) times daily., Disp: 1 each, Rfl: 3 ?  furosemide (LASIX) 40 MG tablet, Take 0.5 tablets (20 mg total) by mouth 2 (two) times daily., Disp: 180 tablet, Rfl: 1 ?  gabapentin (NEURONTIN) 600 MG tablet, TAKE 2 TABLETS BY MOUTH AT BEDTIME, Disp: 180 tablet, Rfl: 0 ?  isosorbide mononitrate (IMDUR) 60 MG 24 hr tablet, TAKE 1 TABLET(60 MG) BY MOUTH DAILY, Disp: 30 tablet, Rfl: 3 ?  metFORMIN (GLUCOPHAGE-XR) 500 MG 24 hr tablet, Take 1 tablet (500 mg total) by mouth in the morning and at bedtime. 1 tab bid, Disp: 60 tablet,  Rfl: 5 ?  metoprolol succinate (TOPROL-XL) 50 MG 24 hr tablet, Take 50 mg by mouth daily., Disp: , Rfl:  ?  nitroGLYCERIN (NITROSTAT) 0.4 MG SL tablet, Place 1 tablet (0.4 mg total) under the tongue every 5 (five) minutes as needed for chest pain., Disp: 25 tablet, Rfl: 2 ?  pantoprazole (PROTONIX) 40 MG tablet, TAKE 1 TABLET(40 MG) BY MOUTH DAILY, Disp: 90 tablet, Rfl: 2 ?  ramipril (ALTACE) 10 MG capsule, TAKE 1 CAPSULE(10 MG) BY MOUTH DAILY, Disp: 90 capsule, Rfl: 0 ?  rivaroxaban (XARELTO) 20 MG TABS tablet, Take 1 tablet (20 mg total) by mouth daily with supper., Disp: 30 tablet, Rfl: 3 ?  diltiazem (CARDIZEM CD) 180 MG 24 hr capsule, Take 1 capsule (180 mg total) by mouth daily., Disp: 90 capsule, Rfl: 3 ?  ezetimibe (ZETIA)  10 MG tablet, Take 1 tablet (10 mg total) by mouth daily., Disp: 90 tablet, Rfl: 3  ? ? ?Assessment & Plan:  ?DM:  recheck HbA1c,  urine  microalbumin  diabetic diet plan given to pt  adviced regarding hypoglycemia and instructions given to pt today on how to prevent and treat the same if it were to occur. pt acknowledges the plan and voices understanding of the same.  exercise plan given and encouraged.   advice diabetic yearly podiatry, ophthalmology , nutritionist , dental check q 6 months ? ?2. HLD is on zetia for such recheck FLP, check LFT's work on diet, SE of meds explained to pt. low fat and high fiber diet explained to pt. ? ? ?3. HTN:  ?Continue current meds.  Medication compliance emphasised. pt advised to keep Bp logs. Pt verbalised understanding of the same. Pt to have a low salt diet . Exercise to reach a goal of at least 150 mins a week.  lifestyle modifications explained and pt understands importance of the above. ?Under good control on current regimen. Continue current regimen. Continue to monitor. Call with any concerns. Refills given. Labs drawn today. ? ?Problem List Items Addressed This Visit   ? ?  ? Endocrine  ? Type 2 diabetes mellitus with morbid obesity ([place]) - Primary  ? Relevant Orders  ? Bayer DCA Hb A1c Waived (Completed)  ? Lipid panel  ? Basic metabolic panel  ? Vitamin B12  ?  ? Other  ? Elevated TSH  ?  ? ?Orders Placed This Encounter  ?Procedures  ? Bayer DCA Hb A1c Waived  ? Lipid panel  ? Basic metabolic panel  ? Vitamin B12  ?  ? ?Meds ordered this encounter  ?Medications  ? diltiazem (CARDIZEM CD) 180 MG 24 hr capsule  ?  Sig: Take 1 capsule (180 mg total) by mouth daily.  ?  Dispense:  90 capsule  ?  Refill:  3  ? ezetimibe (ZETIA) 10 MG tablet  ?  Sig: Take 1 tablet (10 mg total) by mouth daily.  ?  Dispense:  90 tablet  ?  Refill:  3  ?  ? ?Follow up plan: ?No follow-ups on file. ? ? ?

## 2022-04-04 LAB — BASIC METABOLIC PANEL
BUN/Creatinine Ratio: 11 (ref 10–24)
BUN: 13 mg/dL (ref 8–27)
CO2: 21 mmol/L (ref 20–29)
Calcium: 9.7 mg/dL (ref 8.6–10.2)
Chloride: 103 mmol/L (ref 96–106)
Creatinine, Ser: 1.17 mg/dL (ref 0.76–1.27)
Glucose: 144 mg/dL — ABNORMAL HIGH (ref 70–99)
Potassium: 3.7 mmol/L (ref 3.5–5.2)
Sodium: 143 mmol/L (ref 134–144)
eGFR: 67 mL/min/{1.73_m2} (ref >59)

## 2022-04-04 LAB — LIPID PANEL
Chol/HDL Ratio: 2.6 ratio (ref 0.0–5.0)
Cholesterol, Total: 80 mg/dL — ABNORMAL LOW (ref 100–199)
HDL: 31 mg/dL — ABNORMAL LOW (ref >39)
LDL Chol Calc (NIH): 27 mg/dL (ref 0–99)
Triglycerides: 120 mg/dL (ref 0–149)
VLDL Cholesterol Cal: 22 mg/dL (ref 5–40)

## 2022-04-04 LAB — VITAMIN B12: Vitamin B-12: 360 pg/mL (ref 232–1245)

## 2022-04-04 NOTE — Telephone Encounter (Signed)
Requested Prescriptions  ?Pending Prescriptions Disp Refills  ?? gabapentin (NEURONTIN) 600 MG tablet [Pharmacy Med Name: Gabapentin 600 MG Oral Tablet] 180 tablet 0  ?  Sig: TAKE 2 TABLETS BY MOUTH AT BEDTIME  ?  ? Neurology: Anticonvulsants - gabapentin Passed - 04/03/2022  3:53 PM  ?  ?  Passed - Cr in normal range and within 360 days  ?  Creatinine  ?Date Value Ref Range Status  ?07/25/2014 1.21 0.60 - 1.30 mg/dL Final  ? ?Creatinine, Ser  ?Date Value Ref Range Status  ?12/05/2021 1.22 0.76 - 1.27 mg/dL Final  ?   ?  ?  Passed - Completed PHQ-2 or PHQ-9 in the last 360 days  ?  ?  Passed - Valid encounter within last 12 months  ?  Recent Outpatient Visits   ?      ? Yesterday Type 2 diabetes mellitus with morbid obesity ([place])  ? [facility] [name], [name], MD  ? 3 months ago Hyperlipidemia associated with type 2 diabetes mellitus ([place])  ? [facility] [name], [name], MD  ? 3 months ago Restless legs  ? [facility] [name], [name], MD  ? 6 months ago Need for influenza vaccination  ? [facility] [name], [name], MD  ? 10 months ago Hyperlipidemia, unspecified hyperlipidemia type  ? [facility] [name], [name], MD  ?  ?  ?Future Appointments   ?        ? In 3 months [name], [name], MD [facility]  ? In 5 months Ralene Bathe, MD [place]  ?  ? ?  ?  ?  ? ?

## 2022-04-04 NOTE — Telephone Encounter (Signed)
Needs to discuss with Dr. Rockey Situ pl let him know thnx

## 2022-04-04 NOTE — Telephone Encounter (Signed)
Pt states that Dr. Rockey Situ has taken him off this medication. Just an FYI ?

## 2022-04-04 NOTE — Telephone Encounter (Signed)
Requested medication (s) are due for refill today: No ? ?Requested medication (s) are on the active medication list: No ? ?Last refill:   ? ?Future visit scheduled: Yes ? ?Notes to clinic:  Medication not on list. ? ? ? ?Requested Prescriptions  ?Pending Prescriptions Disp Refills  ? clopidogrel (PLAVIX) 75 MG tablet [Pharmacy Med Name: Clopidogrel Bisulfate 75 MG Oral Tablet] 90 tablet 0  ?  Sig: Take 1 tablet by mouth once daily with breakfast  ?  ? Hematology: Antiplatelets - clopidogrel Passed - 04/03/2022  1:58 PM  ?  ?  Passed - HCT in normal range and within 180 days  ?  HCT  ?Date Value Ref Range Status  ?11/14/2021 41.6 39.0 - 52.0 % Final  ? ?Hematocrit  ?Date Value Ref Range Status  ?08/30/2021 42.2 37.5 - 51.0 % Final  ?  ?  ?  ?  Passed - HGB in normal range and within 180 days  ?  Hemoglobin  ?Date Value Ref Range Status  ?11/14/2021 13.5 13.0 - 17.0 g/dL Final  ?08/30/2021 13.4 13.0 - 17.7 g/dL Final  ?  ?  ?  ?  Passed - PLT in normal range and within 180 days  ?  Platelets  ?Date Value Ref Range Status  ?11/14/2021 170 150 - 400 K/uL Final  ?08/30/2021 171 150 - 450 x10E3/uL Final  ?  ?  ?  ?  Passed - Cr in normal range and within 360 days  ?  Creatinine  ?Date Value Ref Range Status  ?07/25/2014 1.21 0.60 - 1.30 mg/dL Final  ? ?Creatinine, Ser  ?Date Value Ref Range Status  ?12/05/2021 1.22 0.76 - 1.27 mg/dL Final  ?  ?  ?  ?  Passed - Valid encounter within last 6 months  ?  Recent Outpatient Visits   ? ?      ? Yesterday Type 2 diabetes mellitus with morbid obesity ([place])  ? [facility] [name], [name], MD  ? 3 months ago Hyperlipidemia associated with type 2 diabetes mellitus ([place])  ? [facility] [name], [name], MD  ? 3 months ago Restless legs  ? [facility] [name], [name], MD  ? 6 months ago Need for influenza vaccination  ? [facility] [name], [name], MD  ? 10 months ago Hyperlipidemia, unspecified hyperlipidemia type  ? [facility] [name],  [name], MD  ? ?  ?  ?Future Appointments   ? ?        ? In 3 months [name], [name], MD [facility], [facility]  ? In 5 months Ralene Bathe, MD [place]  ? ?  ? ? ?  ?  ?  ? ?

## 2022-04-26 ENCOUNTER — Other Ambulatory Visit: Payer: Self-pay

## 2022-04-26 MED ORDER — METFORMIN HCL ER 500 MG PO TB24: 500.0000 mg | ORAL_TABLET | Freq: Two times a day (BID) | ORAL | 3 refills | Status: DC

## 2022-05-10 ENCOUNTER — Encounter: Payer: Self-pay | Admitting: Oncology

## 2022-05-23 ENCOUNTER — Telehealth: Payer: Self-pay

## 2022-05-23 NOTE — Progress Notes (Signed)
Chronic Care Management Pharmacy Assistant   Name: Ernest [name]  MRN: [id number] DOB: Aug 28, 1951   Reason for Encounter: Disease State-General     Recent office visits:  04/03/22 [name], MD-PCP (Diabetes,Hypertension, Hyperlipidemia) Orders: Labs (Bayer DCA Hb A1c, Lipid panel, BMP, Vitamin B-12)  Medication changes: Diltiazem 180 mg daily, Ezetimibe 10 mg daily  12/27/21 [name], [name], MD-PCP (Hypertension) No orders or medication changes  12/13/21 [name], [name], MD-PCP (Diabetes,Hypertension, Hyperlipidemia, Restless leg) No orders; Medication changes: Aspirin 81 mg daily, Albuterol sulfate 108 (90 Base) MCG/ACT    Recent consult visits:  03/07/22 Ralene Bathe, MD-Dermatology (Actinic Keratosis) Orders: Destruction of lesion. No medication changes  12/20/21 Rise Mu, PA-C Cardiology (Permanent Atrial Fibrillation)  Orders: EKG; Medication changes:  Xarelto 20 mg daily    12/05/21 Ralene Bathe, MD-Dermatology (Inflamed seborrheic Keratosis) Orders: Destruction of lesion. No medication changes  Hospital visits:  None in previous 6 months  Medications: Outpatient Encounter Medications as of 05/23/2022  Medication Sig   ACCU-CHEK GUIDE test strip    albuterol (VENTOLIN HFA) 108 (90 Base) MCG/ACT inhaler Inhale 2 puffs into the lungs every 4 (four) hours as needed for wheezing or shortness of breath. Uses 2-3 times daily   atorvastatin (LIPITOR) 80 MG tablet TAKE 1 TABLET(80 MG) BY MOUTH DAILY   clotrimazole-betamethasone (LOTRISONE) cream Apply 1 application topically 2 (two) times daily.   dapagliflozin propanediol (FARXIGA) 10 MG TABS tablet Take 1 tablet (10 mg total) by mouth daily.   diltiazem (CARDIZEM CD) 180 MG 24 hr capsule Take 1 capsule (180 mg total) by mouth daily.   ezetimibe (ZETIA) 10 MG tablet Take 1 tablet (10 mg total) by mouth daily.   Fluticasone-Salmeterol (ADVAIR) 100-50 MCG/DOSE AEPB Inhale 1 puff into the lungs 2 (two) times daily.    furosemide (LASIX) 40 MG tablet Take 0.5 tablets (20 mg total) by mouth 2 (two) times daily.   gabapentin (NEURONTIN) 600 MG tablet TAKE 2 TABLETS BY MOUTH AT BEDTIME   isosorbide mononitrate (IMDUR) 60 MG 24 hr tablet TAKE 1 TABLET(60 MG) BY MOUTH DAILY   metFORMIN (GLUCOPHAGE-XR) 500 MG 24 hr tablet Take 1 tablet (500 mg total) by mouth in the morning and at bedtime. 1 tab bid   metoprolol succinate (TOPROL-XL) 50 MG 24 hr tablet TAKE 1 TABLET BY MOUTH ONCE DAILY WITH OR IMMEDIATELY FOLLOWING A MEAL   nitroGLYCERIN (NITROSTAT) 0.4 MG SL tablet Place 1 tablet (0.4 mg total) under the tongue every 5 (five) minutes as needed for chest pain.   pantoprazole (PROTONIX) 40 MG tablet TAKE 1 TABLET(40 MG) BY MOUTH DAILY   ramipril (ALTACE) 10 MG capsule TAKE 1 CAPSULE(10 MG) BY MOUTH DAILY   rivaroxaban (XARELTO) 20 MG TABS tablet Take 1 tablet (20 mg total) by mouth daily with supper.   No facility-administered encounter medications on file as of 05/23/2022.   [place] for General Review Call   Chart Review:  Have there been any documented new, changed, or discontinued medications since last visit? Yes (If yes, include name, dose, frequency, date)04/03/22 Dr. Dawna Part, Medication changes: Diltiazem 180 mg daily, Ezetimibe 10 mg daily 12/13/21 Aspirin 81 mg daily, Albuterol sulfate 108 (90 Base) MCG/ACT,  12/20/21 Dunn, Areta Haber, PA-C  Medication changes:  Xarelto 20 mg daily  Has there been any documented recent hospitalizations or ED visits since last visit with Clinical Pharmacist? No Brief Summary (including medication and/or Diagnosis changes):   Adherence Review:  Does the Clinical  Pharmacist Assistant have access to adherence rates? Yes Adherence rates for STAR metric medications (List medication(s)/day supply/ last 2 fill dates). Adherence rates for medications indicated for disease state being reviewed (List medication(s)/day supply/ last 2 fill dates). Does the patient have  >5 day gap between last estimated fill dates for any of the above medications or other medication gaps? No Reason for medication gaps.   Disease State Questions:  Able to connect with Patient? Yes  Did patient have any problems with their health recently? No Note problems and Concerns:  Have you had any admissions or emergency room visits or worsening of your condition(s) since last visit? No Details of ED visit, hospital visit and/or worsening condition(s):  Have you had any visits with new specialists or providers since your last visit? No, not recently saw PA in January for f/u Explain:  Have you had any new health care problem(s) since your last visit? No New problem(s) reported:  Have you run out of any of your medications since you last spoke with clinical pharmacist? Yes What caused you to run out of your medications?Patient stated that he is out of his ramipril has not taken in a while. He stated that he just forget to ask for refills on medication  Are there any medications you are not taking as prescribed? No What kept you from taking your medications as prescribed?  Are you having any issues or side effects with your medications? No Note of issues or side effects:  Do you have any other health concerns or questions you want to discuss with your Clinical Pharmacist before your next visit? No Note additional concerns and questions from Patient.  Are there any health concerns that you feel we can do a better job addressing? No Note Patient's response.  Are you having any problems with any of the following since the last visit: (select all that apply)  None  Details:  12. Any falls since last visit? No  Details:  13. Any increased or uncontrolled pain since last visit? No  Details:  14. Next visit Type: telephone       Visit with:Clinical Pharmacist        Date:07/17/22        Time:9 am  15. Additional Details? No    Care Gaps: Colonoscopy-05/23/21 Diabetic  Foot Exam-04/12/20 Ophthalmology-04/19/18 Dexa Scan - NA Annual Well Visit -  Micro albumin-12/05/21 Hemoglobin A1c- 04/03/22  Star Rating Drugs: Metformin 500 mg-last fill 04/26/22 100 mg Atorvastatin 80 mg-last fill 04/03/22 90 ds Ramipril 10 mg-last fill  [place] Pharmacist Assistant [phone]

## 2022-06-14 LAB — HM DIABETES EYE EXAM

## 2022-07-04 ENCOUNTER — Ambulatory Visit (INDEPENDENT_AMBULATORY_CARE_PROVIDER_SITE_OTHER): Payer: Medicare HMO | Admitting: Unknown Physician Specialty

## 2022-07-04 ENCOUNTER — Encounter: Payer: Self-pay | Admitting: Unknown Physician Specialty

## 2022-07-04 DIAGNOSIS — G2581 Restless legs syndrome: Secondary | ICD-10-CM

## 2022-07-04 DIAGNOSIS — I10 Essential (primary) hypertension: Secondary | ICD-10-CM

## 2022-07-04 DIAGNOSIS — K219 Gastro-esophageal reflux disease without esophagitis: Secondary | ICD-10-CM | POA: Diagnosis not present

## 2022-07-04 DIAGNOSIS — E785 Hyperlipidemia, unspecified: Secondary | ICD-10-CM

## 2022-07-04 DIAGNOSIS — Z9989 Dependence on other enabling machines and devices: Secondary | ICD-10-CM

## 2022-07-04 DIAGNOSIS — E1159 Type 2 diabetes mellitus with other circulatory complications: Secondary | ICD-10-CM

## 2022-07-04 DIAGNOSIS — I152 Hypertension secondary to endocrine disorders: Secondary | ICD-10-CM

## 2022-07-04 DIAGNOSIS — G4733 Obstructive sleep apnea (adult) (pediatric): Secondary | ICD-10-CM

## 2022-07-04 DIAGNOSIS — J439 Emphysema, unspecified: Secondary | ICD-10-CM

## 2022-07-04 DIAGNOSIS — E1169 Type 2 diabetes mellitus with other specified complication: Secondary | ICD-10-CM | POA: Diagnosis not present

## 2022-07-04 DIAGNOSIS — I482 Chronic atrial fibrillation, unspecified: Secondary | ICD-10-CM

## 2022-07-04 DIAGNOSIS — I249 Acute ischemic heart disease, unspecified: Secondary | ICD-10-CM

## 2022-07-04 LAB — BAYER DCA HB A1C WAIVED: HB A1C (BAYER DCA - WAIVED): 7.6 % — ABNORMAL HIGH (ref 4.8–5.6)

## 2022-07-04 MED ORDER — ATORVASTATIN CALCIUM 80 MG PO TABS: ORAL_TABLET | ORAL | 1 refills | Status: DC

## 2022-07-04 MED ORDER — RAMIPRIL 10 MG PO CAPS: 10.0000 mg | ORAL_CAPSULE | Freq: Every day | ORAL | 1 refills | Status: DC

## 2022-07-04 MED ORDER — FUROSEMIDE 40 MG PO TABS: 20.0000 mg | ORAL_TABLET | Freq: Two times a day (BID) | ORAL | 1 refills | Status: DC

## 2022-07-04 MED ORDER — CLOTRIMAZOLE-BETAMETHASONE 1-0.05 % EX CREA
1.0000 | TOPICAL_CREAM | Freq: Two times a day (BID) | CUTANEOUS | 2 refills | Status: DC
Start: 2022-07-04 — End: 2023-01-24

## 2022-07-04 MED ORDER — DAPAGLIFLOZIN PROPANEDIOL 10 MG PO TABS: 10.0000 mg | ORAL_TABLET | Freq: Every day | ORAL | 1 refills | Status: DC

## 2022-07-04 MED ORDER — ISOSORBIDE MONONITRATE ER 60 MG PO TB24: ORAL_TABLET | ORAL | 1 refills | Status: DC

## 2022-07-04 MED ORDER — RIVAROXABAN 20 MG PO TABS: 20.0000 mg | ORAL_TABLET | Freq: Every day | ORAL | 3 refills | Status: DC

## 2022-07-04 MED ORDER — GABAPENTIN 600 MG PO TABS: 1200.0000 mg | ORAL_TABLET | Freq: Every day | ORAL | 1 refills | Status: DC

## 2022-07-04 MED ORDER — BREZTRI AEROSPHERE 160-9-4.8 MCG/ACT IN AERO: 2.0000 | INHALATION_SPRAY | Freq: Two times a day (BID) | RESPIRATORY_TRACT | 11 refills | Status: DC

## 2022-07-04 NOTE — Addendum Note (Signed)
Addended by: Kathrine Haddock on: 07/04/2022 12:00 PM   Modules accepted: Orders

## 2022-07-04 NOTE — Assessment & Plan Note (Signed)
Doesn't use CPAP

## 2022-07-04 NOTE — Assessment & Plan Note (Signed)
Hgb A1C has been stable.  Continue present medications

## 2022-07-04 NOTE — Assessment & Plan Note (Signed)
Stable on medications

## 2022-07-04 NOTE — Assessment & Plan Note (Signed)
Stable, continue present medications.   

## 2022-07-04 NOTE — Progress Notes (Signed)
BP 130/74   Pulse 76   Temp 98.7 F (37.1 C) (Oral)   Wt 238 lb 12.8 oz (108.3 kg)   SpO2 97%   BMI 33.43 kg/m    Subjective:    Patient ID: Ernest [name], male    DOB: 1951/04/23, 71 y.o.   MRN: [id number]  HPI: Ernest [name] is a 71 y.o. male  Chief Complaint  Patient presents with   Diabetes   Hyperlipidemia   Hypertension   Diabetes: Using medications without difficulties No hypoglycemic episodes No hyperglycemic episodes Feet problems: none Blood Sugars averaging: around 100 eye exam within last year Last Hgb A1C: 6.9  Hypertension  Using medications without difficulty Average home BPs: rarely checks  Using medication without problems or lightheadedness No chest pain with exertion or shortness of breath No Edema  Elevated Cholesterol Using medications without problems No Muscle aches  Diet: Exercise: Works in the garden, Film/video editor, working on cars  COPD Takes Ventolin daily or TID.  No history of hospitalizations.  No longer smokes.    Anemia Sees hematology   Relevant past medical, surgical, family and social history reviewed and updated as indicated. Interim medical history since our last visit reviewed. Allergies and medications reviewed and updated.  Review of Systems  Per HPI unless specifically indicated above     Objective:    BP 130/74   Pulse 76   Temp 98.7 F (37.1 C) (Oral)   Wt 238 lb 12.8 oz (108.3 kg)   SpO2 97%   BMI 33.43 kg/m   Wt Readings from Last 3 Encounters:  07/04/22 238 lb 12.8 oz (108.3 kg)  04/03/22 238 lb (108 kg)  12/27/21 249 lb (112.9 kg)    Physical Exam Constitutional:      General: He is not in acute distress.    Appearance: Normal appearance. He is well-developed.  HENT:     Head: Normocephalic and atraumatic.  Eyes:     General: Lids are normal. No scleral icterus.       Right eye: No discharge.        Left eye: No discharge.     Conjunctiva/sclera: Conjunctivae normal.  Neck:      Vascular: No carotid bruit or JVD.  Cardiovascular:     Rate and Rhythm: Normal rate and regular rhythm.     Heart sounds: Normal heart sounds.  Pulmonary:     Effort: Pulmonary effort is normal. No respiratory distress.     Breath sounds: Normal breath sounds.  Abdominal:     Palpations: There is no hepatomegaly or splenomegaly.  Musculoskeletal:        General: Normal range of motion.     Cervical back: Normal range of motion and neck supple.  Skin:    General: Skin is warm and dry.     Coloration: Skin is not pale.     Findings: No rash.  Neurological:     Mental Status: He is alert and oriented to person, place, and time.  Psychiatric:        Behavior: Behavior normal.        Thought Content: Thought content normal.        Judgment: Judgment normal.     Results for orders placed or performed in visit on 06/22/22  HM DIABETES EYE EXAM  Result Value Ref Range   HM Diabetic Eye Exam No Retinopathy No Retinopathy      Assessment & Plan:   Problem List Items Addressed This  Visit       Unprioritized   ACS (acute coronary syndrome) ([place])    Sees Dr. Rockey Situ every 6 months      Relevant Medications   atorvastatin (LIPITOR) 80 MG tablet   furosemide (LASIX) 40 MG tablet   isosorbide mononitrate (IMDUR) 60 MG 24 hr tablet   ramipril (ALTACE) 10 MG capsule   rivaroxaban (XARELTO) 20 MG TABS tablet   Atrial fibrillation (HCC)    Currently taking Xaralto without any signs of bleeding.  Long standing anemia and sees hematology      Relevant Medications   atorvastatin (LIPITOR) 80 MG tablet   furosemide (LASIX) 40 MG tablet   isosorbide mononitrate (IMDUR) 60 MG 24 hr tablet   ramipril (ALTACE) 10 MG capsule   rivaroxaban (XARELTO) 20 MG TABS tablet   COPD (chronic obstructive pulmonary disease) with emphysema (HCC)    Taking Advair plus rescue inhaler 1-3 times/day.  Will change to [employer].  Take 2 puffs twice a day      Relevant Medications    Budeson-Glycopyrrol-Formoterol (BREZTRI AEROSPHERE) 160-9-4.8 MCG/ACT AERO   Other Relevant Orders   Spirometry with Graph (Completed)   GERD (gastroesophageal reflux disease)    Taking Protonix and doing well with this      Hyperlipidemia associated with type 2 diabetes mellitus ([place])    On statin.  Check lipids today      Relevant Medications   atorvastatin (LIPITOR) 80 MG tablet   dapagliflozin propanediol (FARXIGA) 10 MG TABS tablet   furosemide (LASIX) 40 MG tablet   isosorbide mononitrate (IMDUR) 60 MG 24 hr tablet   ramipril (ALTACE) 10 MG capsule   rivaroxaban (XARELTO) 20 MG TABS tablet   Hypertension associated with diabetes ([place])    Stable, continue present medications.        Relevant Medications   atorvastatin (LIPITOR) 80 MG tablet   dapagliflozin propanediol (FARXIGA) 10 MG TABS tablet   furosemide (LASIX) 40 MG tablet   isosorbide mononitrate (IMDUR) 60 MG 24 hr tablet   ramipril (ALTACE) 10 MG capsule   rivaroxaban (XARELTO) 20 MG TABS tablet   Other Relevant Orders   Comprehensive metabolic panel   Lipid Panel [place], Waived   OSA on CPAP    Doesn't use CPAP      Restless legs    Stable on medications      Relevant Medications   gabapentin (NEURONTIN) 600 MG tablet   Type 2 diabetes mellitus with morbid obesity (HCC) - Primary    Hgb A1C has been stable.  Continue present medications      Relevant Medications   atorvastatin (LIPITOR) 80 MG tablet   dapagliflozin propanediol (FARXIGA) 10 MG TABS tablet   ramipril (ALTACE) 10 MG capsule   Other Relevant Orders   Bayer DCA Hb A1c Waived   Other Visit Diagnoses     Essential hypertension       Relevant Medications   atorvastatin (LIPITOR) 80 MG tablet   furosemide (LASIX) 40 MG tablet   isosorbide mononitrate (IMDUR) 60 MG 24 hr tablet   ramipril (ALTACE) 10 MG capsule   rivaroxaban (XARELTO) 20 MG TABS tablet        Follow up plan: Return in about 3 months (around  10/04/2022).

## 2022-07-04 NOTE — Assessment & Plan Note (Signed)
On statin.  Check lipids today

## 2022-07-04 NOTE — Assessment & Plan Note (Addendum)
Taking Advair plus rescue inhaler 1-3 times/day.  Will change to [employer].  Take 2 puffs twice a day

## 2022-07-04 NOTE — Assessment & Plan Note (Signed)
Sees Dr. Rockey Situ every 6 months

## 2022-07-04 NOTE — Assessment & Plan Note (Addendum)
Currently taking Xaralto without any signs of bleeding.  Long standing anemia and sees hematology

## 2022-07-04 NOTE — Assessment & Plan Note (Signed)
Taking Protonix and doing well with this

## 2022-07-05 LAB — COMPREHENSIVE METABOLIC PANEL
ALT: 39 IU/L (ref 0–44)
AST: 40 IU/L (ref 0–40)
Albumin/Globulin Ratio: 2 (ref 1.2–2.2)
Albumin: 4.7 g/dL (ref 3.8–4.8)
Alkaline Phosphatase: 110 IU/L (ref 44–121)
BUN/Creatinine Ratio: 16 (ref 10–24)
BUN: 22 mg/dL (ref 8–27)
Bilirubin Total: 2.5 mg/dL — ABNORMAL HIGH (ref 0.0–1.2)
CO2: 23 mmol/L (ref 20–29)
Calcium: 10.2 mg/dL (ref 8.6–10.2)
Chloride: 99 mmol/L (ref 96–106)
Creatinine, Ser: 1.39 mg/dL — ABNORMAL HIGH (ref 0.76–1.27)
Globulin, Total: 2.4 g/dL (ref 1.5–4.5)
Glucose: 147 mg/dL — ABNORMAL HIGH (ref 70–99)
Potassium: 4.5 mmol/L (ref 3.5–5.2)
Sodium: 141 mmol/L (ref 134–144)
Total Protein: 7.1 g/dL (ref 6.0–8.5)
eGFR: 54 mL/min/{1.73_m2} — ABNORMAL LOW (ref >59)

## 2022-07-05 LAB — LIPID PANEL W/O CHOL/HDL RATIO
Cholesterol, Total: 87 mg/dL — ABNORMAL LOW (ref 100–199)
HDL: 26 mg/dL — ABNORMAL LOW (ref >39)
LDL Chol Calc (NIH): 29 mg/dL (ref 0–99)
Triglycerides: 200 mg/dL — ABNORMAL HIGH (ref 0–149)
VLDL Cholesterol Cal: 32 mg/dL (ref 5–40)

## 2022-07-17 ENCOUNTER — Ambulatory Visit: Payer: Medicare HMO

## 2022-07-17 DIAGNOSIS — I482 Chronic atrial fibrillation, unspecified: Secondary | ICD-10-CM

## 2022-07-17 DIAGNOSIS — I152 Hypertension secondary to endocrine disorders: Secondary | ICD-10-CM

## 2022-07-17 DIAGNOSIS — I10 Essential (primary) hypertension: Secondary | ICD-10-CM

## 2022-07-17 NOTE — Progress Notes (Signed)
Chronic Care Management Pharmacy Note  07/17/2022 Name:  Shalik Sanfilippo MRN:  [id number] DOB:  05-Jun-1951  Summary: -Pleasant 71 year old male presents for f/u CCM visit. He has a garden full of corn, beans, tomatoes, squash, and okra. He used to work for [employer] for 28 years. Currently, he loves to fish and camp. He also tinkers on his cars, specifically his [place]  Recommendations/Changes made from today's visit: -Patient very non-compliant on meds. Onboard to [facility] to assess compliant/improve -PAP for Farxiga, Rivaroxaban, and Advair  Subjective: Christian Borgerding is an 71 y.o. year old male who is a primary patient of [facility], [facility].  The CCM team was consulted for assistance with disease management and care coordination needs.    Engaged with patient by telephone for follow up visit in response to provider referral for pharmacy case management and/or care coordination services.   Consent to Services:  The patient was given the following information about Chronic Care Management services today, agreed to services, and gave verbal consent: 1. CCM service includes personalized support from designated clinical staff supervised by the primary care provider, including individualized plan of care and coordination with other care providers 2. 24/7 contact phone numbers for assistance for urgent and routine care needs. 3. Service will only be billed when office clinical staff spend 20 minutes or more in a month to coordinate care. 4. Only one practitioner may furnish and bill the service in a calendar month. 5.The patient may stop CCM services at any time (effective at the end of the month) by phone call to the office staff. 6. The patient will be responsible for cost sharing (co-pay) of up to 20% of the service fee (after annual deductible is met). Patient agreed to services and consent obtained.  Patient Care Team: [facility] as PCP -  General Gollan, Kathlene November, MD as PCP - Cardiology (Cardiology) Minna Merritts, MD as Consulting Physician (Cardiology) Lane Hacker, [facility] (Pharmacist)    Recent office visits:  04/03/22 [name], MD-PCP (Diabetes,Hypertension, Hyperlipidemia) Orders: Labs (Bayer DCA Hb A1c, Lipid panel, BMP, Vitamin B-12)  Medication changes: Diltiazem 180 mg daily, Ezetimibe 10 mg daily   12/27/21 [name], [name], MD-PCP (Hypertension) No orders or medication changes   12/13/21 [name], [name], MD-PCP (Diabetes,Hypertension, Hyperlipidemia, Restless leg) No orders; Medication changes: Aspirin 81 mg daily, Albuterol sulfate 108 (90 Base) MCG/ACT     Recent consult visits:  03/07/22 Ralene Bathe, MD-Dermatology (Actinic Keratosis) Orders: Destruction of lesion. No medication changes   12/20/21 Rise Mu, PA-C Cardiology (Permanent Atrial Fibrillation)  Orders: EKG; Medication changes:  Xarelto 20 mg daily    12/05/21 Ralene Bathe, MD-Dermatology (Inflamed seborrheic Keratosis) Orders: Destruction of lesion. No medication changes   Hospital visits:  None in previous 6 months   Objective:  Lab Results  Component Value Date   CREATININE 1.39 (H) 07/04/2022   BUN 22 07/04/2022   EGFR 54 (L) 07/04/2022   GFRNONAA >60 04/04/2021   GFRAA 81 11/19/2020   NA 141 07/04/2022   K 4.5 07/04/2022   CALCIUM 10.2 07/04/2022   CO2 23 07/04/2022   GLUCOSE 147 (H) 07/04/2022    Lab Results  Component Value Date/Time   HGBA1C 7.6 (H) 07/04/2022 11:47 AM   HGBA1C 6.9 (H) 04/03/2022 11:03 AM   HGBA1C 7.5 (H) 07/25/2014 04:25 AM   MICROALBUR 80 (H) 12/05/2021 08:34 AM   MICROALBUR 80 (H) 05/31/2021 02:16 PM  Last diabetic Eye exam:  Lab Results  Component Value Date/Time   HMDIABEYEEXA No Retinopathy 06/14/2022 12:00 AM    Last diabetic Foot exam: No results found for: "HMDIABFOOTEX"   Lab Results  Component Value Date   CHOL 87 (L) 07/04/2022   HDL 26 (L) 07/04/2022   LDLCALC 29  07/04/2022   TRIG 200 (H) 07/04/2022   CHOLHDL 2.6 04/03/2022       Latest Ref Rng & Units 07/04/2022   11:49 AM 12/05/2021    8:38 AM 08/30/2021   10:11 AM  Hepatic Function  Total Protein 6.0 - 8.5 g/dL 7.1  6.8  6.9   Albumin 3.8 - 4.8 g/dL 4.7  4.5  4.6   AST 0 - 40 IU/L 40  19  22   ALT 0 - 44 IU/L 39  15  17   Alk Phosphatase 44 - 121 IU/L 110  90  92   Total Bilirubin 0.0 - 1.2 mg/dL 2.5  1.4  1.2     Lab Results  Component Value Date/Time   TSH 2.960 12/05/2021 08:38 AM   TSH 2.290 05/31/2021 02:17 PM   FREET4 1.36 12/20/2020 11:32 AM       Latest Ref Rng & Units 11/14/2021    1:06 PM 08/30/2021   10:11 AM 08/09/2021    2:15 PM  CBC  WBC 4.0 - 10.5 K/uL 7.4  6.6  6.5   Hemoglobin 13.0 - 17.0 g/dL 13.5  13.4  12.0   Hematocrit 39.0 - 52.0 % 41.6  42.2  37.6   Platelets 150 - 400 K/uL 170  171  172     No results found for: "VD25OH"  Clinical ASCVD: Yes  The ASCVD Risk score (Arnett DK, et al., 2019) failed to calculate for the following reasons:   The valid total cholesterol range is 130 to 320 mg/dL       04/03/2022   10:46 AM 02/15/2022   10:26 AM 12/13/2021   10:32 AM  Depression screen PHQ 2/9  Decreased Interest 0 0 0  Down, Depressed, Hopeless 0 0 0  PHQ - 2 Score 0 0 0  Altered sleeping 0  0  Tired, decreased energy 0  0  Change in appetite 0  0  Feeling bad or failure about yourself  0  0  Trouble concentrating 0  0  Moving slowly or fidgety/restless 0  0  Suicidal thoughts 0  0  PHQ-9 Score 0  0  Difficult doing work/chores Not difficult at all  Not difficult at all     Other: (CHADS2VASc if Afib, MMRC or CAT for COPD, ACT, DEXA)  Social History   Tobacco Use  Smoking Status Former   Packs/day: 4.00   Years: 28.00   Total pack years: 112.00   Types: Cigarettes   Quit date: 08/25/1993   Years since quitting: 28.9  Smokeless Tobacco Never   BP Readings from Last 3 Encounters:  07/04/22 130/74  04/03/22 (!) 143/79  12/27/21 139/77    Pulse Readings from Last 3 Encounters:  07/04/22 76  04/03/22 67  12/27/21 69   Wt Readings from Last 3 Encounters:  07/04/22 238 lb 12.8 oz (108.3 kg)  04/03/22 238 lb (108 kg)  12/27/21 249 lb (112.9 kg)   BMI Readings from Last 3 Encounters:  07/04/22 33.43 kg/m  04/03/22 33.32 kg/m  12/27/21 34.86 kg/m    Assessment/Interventions: Review of patient past medical history, allergies, medications, health status, including review of consultants  reports, laboratory and other test data, was performed as part of comprehensive evaluation and provision of chronic care management services.   SDOH:  (Social Determinants of Health) assessments and interventions performed: Yes SDOH Interventions    Flowsheet Row Most Recent Value  SDOH Interventions   Financial Strain Interventions Other (Comment)  [PAP]  Transportation Interventions Intervention Not Indicated      SDOH Screenings   Alcohol Screen: Low Risk  (02/15/2022)   Alcohol Screen    Last Alcohol Screening Score (AUDIT): 0  Depression (PHQ2-9): Low Risk  (04/03/2022)   Depression (PHQ2-9)    PHQ-2 Score: 0  Financial Resource Strain: High Risk (07/17/2022)   Overall Financial Resource Strain (CARDIA)    Difficulty of Paying Living Expenses: Very hard  Food Insecurity: Food Insecurity Present (02/16/2022)   Hunger Vital Sign    Worried About Running Out of Food in the Last Year: Often true    Ran Out of Food in the Last Year: Often true  Housing: Low Risk  (02/15/2022)   Housing    Last Housing Risk Score: 0  Physical Activity: Inactive (02/15/2022)   Exercise Vital Sign    Days of Exercise per Week: 0 days    Minutes of Exercise per Session: 0 min  Social Connections: Socially Isolated (02/15/2022)   Social Connection and Isolation Panel [NHANES]    Frequency of Communication with Friends and Family: Twice a week    Frequency of Social Gatherings with Friends and Family: Twice a week    Attends Religious Services: Never     Marine scientist or Organizations: No    Attends Archivist Meetings: Never    Marital Status: Widowed  Stress: Stress Concern Present (02/15/2022)   [place]    Feeling of Stress : To some extent  Tobacco Use: Medium Risk (07/04/2022)   Patient History    Smoking Tobacco Use: Former    Smokeless Tobacco Use: Never    Passive Exposure: Not on file  Transportation Needs: No Transportation Needs (07/17/2022)   PRAPARE - Hydrologist (Medical): No    Lack of Transportation (Non-Medical): No    CCM Care Plan  Allergies  Allergen Reactions   Doxycycline Other (See Comments)    Mouth sores     Medications Reviewed Today     Reviewed by Lane Hacker, [facility] (Pharmacist) on 07/17/22 at [place] List Status: <None>   Medication Order Taking? Sig Documenting Provider Last Dose Status Informant  ACCU-CHEK GUIDE test strip [id number] No  [provider] Taking Active   albuterol (VENTOLIN HFA) 108 (90 Base) MCG/ACT inhaler [id number] No Inhale 2 puffs into the lungs every 4 (four) hours as needed for wheezing or shortness of breath. Uses 2-3 times daily [name], [name], MD Taking Active   atorvastatin (LIPITOR) 80 MG tablet [id number]  TAKE 1 TABLET(80 MG) BY MOUTH DAILY Kathrine Haddock, NP  Active   Budeson-Glycopyrrol-Formoterol (BREZTRI AEROSPHERE) 160-9-4.8 MCG/ACT Hollie Salk [id number]  Inhale 2 puffs into the lungs 2 (two) times daily. Kathrine Haddock, NP  Active   clotrimazole-betamethasone (LOTRISONE) cream [id number]  Apply 1 Application topically 2 (two) times daily. Kathrine Haddock, NP  Active   dapagliflozin propanediol (FARXIGA) 10 MG TABS tablet [id number]  Take 1 tablet (10 mg total) by mouth daily. Kathrine Haddock, NP  Active   diltiazem [facility] CD) 180 MG 24 hr capsule [id number] No Take 1 capsule (180 mg total)  by mouth daily. [name], [name], MD Taking Active   ezetimibe  (ZETIA) 10 MG tablet [id number] No Take 1 tablet (10 mg total) by mouth daily. [name], [name], MD Taking Active   Fluticasone-Salmeterol (ADVAIR) 100-50 MCG/DOSE AEPB [id number] No Inhale 1 puff into the lungs 2 (two) times daily. [name], MD Taking Active   furosemide (LASIX) 40 MG tablet [id number]  Take 0.5 tablets (20 mg total) by mouth 2 (two) times daily. Kathrine Haddock, NP  Active   gabapentin (NEURONTIN) 600 MG tablet [id number]  Take 2 tablets (1,200 mg total) by mouth at bedtime. Kathrine Haddock, NP  Active   isosorbide mononitrate (IMDUR) 60 MG 24 hr tablet [id number]  TAKE 1 TABLET(60 MG) BY MOUTH DAILY Kathrine Haddock, NP  Active   metFORMIN (GLUCOPHAGE-XR) 500 MG 24 hr tablet [id number] No Take 1 tablet (500 mg total) by mouth in the morning and at bedtime. 1 tab bid [name], [name], MD Taking Active   metoprolol succinate (TOPROL-XL) 50 MG 24 hr tablet [id number] No TAKE 1 TABLET BY MOUTH ONCE DAILY WITH OR IMMEDIATELY FOLLOWING A MEAL Dunn, Ryan M, PA-C Taking Active   nitroGLYCERIN (NITROSTAT) 0.4 MG SL tablet [id number] No Place 1 tablet (0.4 mg total) under the tongue every 5 (five) minutes as needed for chest pain. Furth, Cadence H, PA-C Taking Active   pantoprazole (PROTONIX) 40 MG tablet [id number] No TAKE 1 TABLET(40 MG) BY MOUTH DAILY [name], [name], MD Taking Active   ramipril (ALTACE) 10 MG capsule [id number]  Take 1 capsule (10 mg total) by mouth daily. Kathrine Haddock, NP  Active   rivaroxaban (XARELTO) 20 MG TABS tablet [id number]  Take 1 tablet (20 mg total) by mouth daily with supper. Kathrine Haddock, NP  Active             Patient Active Problem List   Diagnosis Date Noted   History of colonic polyps    Arteriovenous malformation of colon    Aortic atherosclerosis ([place]) 03/09/2021   B12 deficiency 12/21/2020   Elevated TSH 11/21/2020   Type 2 diabetes mellitus with morbid obesity ([place]) 11/19/2020   Morbid (severe) obesity due to excess calories ([place]) 04/12/2020   ACS (acute  coronary syndrome) ([place]) 03/06/2019   COPD (chronic obstructive pulmonary disease) with emphysema ([place]) 10/10/2018   Gastric AVM    Angiodysplasia of intestinal tract    Iron deficiency anemia due to chronic blood loss 04/25/2018   Advanced care planning/counseling discussion 03/13/2018   Calcification of abdominal aorta ([place]) 02/07/2017   GERD (gastroesophageal reflux disease) 11/25/2015   Restless legs 11/25/2015   BPH (benign prostatic hyperplasia) 11/25/2015   Hypertension associated with diabetes ([place]) 07/06/2015   OSA on CPAP 08/25/2014   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 ([place]) 06/16/2010   Hyperlipidemia associated with type 2 diabetes mellitus ([place]) 06/03/2010   Atrial fibrillation ([place]) 06/03/2010    Immunization History  Administered Date(s) Administered   Fluad Quad(high Dose 65+) 11/21/2019, 11/19/2020, 09/12/2021   Influenza Split 08/25/2013   Influenza, High Dose Seasonal PF 09/20/2016, 11/02/2017, 10/10/2018   Influenza,inj,Quad PF,6+ Mos 11/17/2015   Influenza-Unspecified 12/23/2014   PFIZER(Purple Top)SARS-COV-2 Vaccination 11/22/2020, 12/13/2020   Pneumococcal Conjugate-13 11/25/2015   Pneumococcal Polysaccharide-23 10/25/2013, 02/03/2019   Pneumococcal-Unspecified 10/26/2009   Tdap 11/06/2012   Zoster, Live 02/04/2013    Conditions to be addressed/monitored:  Hypertension, Hyperlipidemia, and Diabetes  Care Plan : [place]  Updates made by Lane Hacker, [facility] since  07/17/2022 12:00 AM     Problem: DM2, HTN, CKD, HLD, HF, CAD, Afib, COPD, GERD, Obesity   Priority: High     Long-Range Goal: Disease Management   Start Date: 04/04/2021  Recent Progress: Not on track  Priority: High  Note:   Current Barriers:  Unable to independently afford treatment regimen Unable to independently monitor therapeutic efficacy Unable to maintain control of hypertension Suboptimal  therapeutic regimen for diabetes Does not adhere to prescribed medication regimen Does not maintain contact with provider office   Pharmacist Clinical Goal(s):  Patient will verbalize ability to afford treatment regimen achieve adherence to monitoring guidelines and medication adherence to achieve therapeutic efficacy achieve control of hypertension as evidenced by BP readings maintain control of diabetes as evidenced by A1c and SMBG values  adhere to plan to optimize therapeutic regimen for diabetes as evidenced by report of adherence to recommended medication management changes adhere to prescribed medication regimen as evidenced by fill dates & patient report contact provider office for questions/concerns as evidenced notation of same in electronic health record through collaboration with PharmD and provider.    Interventions: 1:1 collaboration with [name], MD regarding development and update of comprehensive plan of care as evidenced by provider attestation and co-signature Inter-disciplinary care team collaboration (see longitudinal plan of care) Comprehensive medication review performed; medication list updated in electronic medical record    HypertensionCKD  (BP goal <130/80) BP Readings from Last 3 Encounters:  07/04/22 130/74  04/03/22 (!) 143/79  12/27/21 139/77  -Not ideally controlled -Current treatment: Diltiazem er 180 mg qd Appropriate, Effective, Safe, Accessible Ramipril 10 mg qd Appropriate, Effective, Safe, Accessible Furosemide 20 mg bid Appropriate, Effective, Safe, Accessible Isosorbide Mononitrate er 60 mg qd Appropriate, Effective, Safe, Accessible Metoprolol Succ 45m QD Appropriate, Effective, Safe, Accessible -Medications previously tried: Carvedilol -Current home readings: 130-150/85 -Current dietary habits: Eats out most days, [place] -Current exercise habits: No structured regimen but stays busy around his house -Denies  hypotensive/hypertensive symptoms -Educated on BP goals and benefits of medications for prevention of heart attack, stroke and kidney damage; Daily salt intake goal < 2300 mg; Exercise goal of 150 minutes per week; Proper BP monitoring technique; Symptoms of hypotension and importance of maintaining adequate hydration; -Counseled to monitor BP at home daily, document, and provide log at future appointments -Counseled on diet and exercise extensively July 2023: Patient non-compliant on meds. Priority is to improve compliance and then can further assess (Though his BP has been at goal in the past)   Hyperlipidemia/ CAD with angina: (LDL goal <70) The ASCVD Risk score (Arnett DK, et al., 2019) failed to calculate for the following reasons:   The valid total cholesterol range is 130 to 320 mg/dL Lab Results  Component Value Date   CHOL 87 (L) 07/04/2022   CHOL 80 (L) 04/03/2022   CHOL 73 (L) 12/05/2021   Lab Results  Component Value Date   HDL 26 (L) 07/04/2022   HDL 31 (L) 04/03/2022   HDL 24 (L) 12/05/2021   Lab Results  Component Value Date   LDLCALC 29 07/04/2022   LDLCALC 27 04/03/2022   LDLCALC 22 12/05/2021   Lab Results  Component Value Date   TRIG 200 (H) 07/04/2022   TRIG 120 04/03/2022   TRIG 158 (H) 12/05/2021   Lab Results  Component Value Date   CHOLHDL 2.6 04/03/2022   CHOLHDL 3.0 12/05/2021   CHOLHDL 5.5 (H) 08/30/2021  No results found for: "LDLDIRECT" -Controlled -  Current treatment: Atorvastatin 80 mg qd Appropriate, Effective, Safe, Accessible SL NTG 0.4 mg prn Appropriate, Effective, Safe, Accessible -Medications previously tried: NA --Educated on Cholesterol goals;  Benefits of statin for ASCVD risk reduction; Exercise goal of 150 minutes per week; -Counseled on diet and exercise extensively July 2023: Patient non-compliant on meds. Priority is to improve compliance and then can further assess   Diabetes (A1c goal <7%) Lab Results  Component  Value Date   HGBA1C 7.6 (H) 07/04/2022   HGBA1C 6.9 (H) 04/03/2022   HGBA1C 6.2 (H) 12/05/2021   Lab Results  Component Value Date   MICROALBUR 80 (H) 12/05/2021   LDLCALC 29 07/04/2022   CREATININE 1.39 (H) 07/04/2022   Lab Results  Component Value Date   NA 141 07/04/2022   K 4.5 07/04/2022   CREATININE 1.39 (H) 07/04/2022   EGFR 54 (L) 07/04/2022   GFRNONAA >60 04/04/2021   GLUCOSE 147 (H) 07/04/2022   Lab Results  Component Value Date   WBC 7.4 11/14/2021   HGB 13.5 11/14/2021   HCT 41.6 11/14/2021   MCV 83.9 11/14/2021   PLT 170 11/14/2021  -Controlled -Current medications: Metformin 1000 xr bid Appropriate, Effective, Safe, Accessible farxiga 5 mg qd Appropriate, Effective, Safe, Query accessible -Medications previously tried: glipizide 5 - hypoglycemia -Current home glucose readings fasting glucose:  July 2023: Patient didn't have readings on him -Denies hypoglycemic/hyperglycemic symptoms --Educated on A1c and blood sugar goals; Complications of diabetes including kidney damage, retinal damage, and cardiovascular disease; Exercise goal of 150 minutes per week; Benefits of routine self-monitoring of blood sugar; Carbohydrate counting and/or plate method -Counseled to check feet daily and get yearly eye exams -Counseled on diet and exercise extensively July 2023: PAP for [place] (Then will increase dose)  Atrial Fibrillation (Goal: prevent stroke and major bleeding) -Controlled -CHADSVASC: 5 -Current treatment: Rate control:  Diltiazem er 180 mg qd Appropriate, Effective, Safe, Accessible Ramipril 10 mg qd Appropriate, Effective, Safe, Accessible Furosemide 20 mg bid Appropriate, Effective, Safe, Accessible Isosorbide Mononitrate er 60 mg qd  Appropriate, Effective, Safe, Accessible Metoprolol Succ 70m QD Appropriate, Effective, Safe, Accessible Anticoagulation:  Xarelto 20 mg qd Appropriate, Effective, Safe, Query accessible -Medications previously  tried: Toprol -Home BP and HR readings: 130-150s/80s  -Counseled on increased risk of stroke due to Afib and benefits of anticoagulation for stroke prevention; importance of adherence to anticoagulant exactly as prescribed; avoidance of NSAIDs due to increased bleeding risk with anticoagulants; importance of regular laboratory monitoring; seeking medical attention after a head injury or if there is blood in the urine/stool; -Counseled on diet and exercise extensively Recommended to continue current medication Assessed patient finances. Patient will return PAP application for Xarelto  COPD (Goal: control symptoms and prevent exacerbations) -Not ideally controlled -Current treatment  Albuterol Appropriate, Effective, Safe, Accessible Advair Appropriate, Effective, Safe, Query accessible -Medications previously tried: NA  -Gold Grade: Gold 1 (FEV1>80%) -Current COPD Classification:  B (high sx, <2 exacerbations/yr) -Pulmonary function testing:  PF Readings from Last 3 Encounters:  No data found for PF  -Exacerbations requiring treatment in last 6 months: none -Patient reports consistent use of maintenance inhaler -Frequency of rescue inhaler use: several times weekly, More SOB lately-likely multifactoral  -Counseled on Proper inhaler technique; Benefits of consistent maintenance inhaler use When to use rescue inhaler -Counseled on diet and exercise extensively July 2023: PAP symbicort    Patient Goals/Self-Care Activities Patient will:  - take medications as prescribed focus on medication adherence by using pill box check glucose  fasting and 2 hours after dinner, document, and provide at future appointments check blood pressure daily, document, and provide at future appointments weigh daily, and contact provider if weight gain of 2 lbs overnight or 5 lbs in a week collaborate with provider on medication access solutions target a minimum of 150 minutes of moderate intensity  exercise weekly engage in dietary modifications by preparing meals at home 3-5 nights/week  Follow Up Plan: Telephone follow up appointment with care management team member scheduled for: Jan 2023  [place] Constable, Pharm.D. - [phone]          Medication Assistance: None required.  Patient affirms current coverage meets needs.  Compliance/Adherence/Medication fill history: Care Gaps: Colonoscopy-05/23/21 Diabetic Foot Exam-04/12/20 Ophthalmology-04/19/18 Dexa Scan - NA Annual Well Visit -  Micro albumin-12/05/21 Hemoglobin A1c- 04/03/22   Star Rating Drugs: Metformin 500 mg-last fill 04/26/22 100 mg Atorvastatin 80 mg-last fill 04/03/22 90 ds Ramipril 10 mg-last fill  Patient's preferred pharmacy is:  [facility] [address], [name] - [name] [address] Phone: [phone] Fax: [phone]  Uses pill box? No -   Pt endorses 50% compliance  We discussed: Benefits of medication synchronization, packaging and delivery as well as enhanced pharmacist oversight with [facility]. Patient decided to: Utilize [facility] pharmacy for medication synchronization, packaging and delivery -Patient would like [facility] to conduct PAP. Patient would like meds on the same day Verbal consent obtained for [facility] enhanced pharmacy services (medication synchronization, adherence packaging, delivery coordination). A medication sync plan was created to allow patient to get all medications delivered once every 30 to 90 days per patient preference. Patient understands they have freedom to choose pharmacy and clinical pharmacist will coordinate care between all prescribers and [facility].   Care Plan and Follow Up Patient Decision:  Patient agrees to Care Plan and Follow-up.  Plan: The patient has been provided with contact information for the care management team and has been advised to call with any health related questions or concerns.    CPP F/U Jan 2024  [place] Constable, Pharm.D. - [phone]

## 2022-07-17 NOTE — Patient Instructions (Signed)
Visit Information   Goals Addressed   None    Patient Care Plan: CCM Pharmacy Care Plan     Problem Identified: DM2, HTN, CKD, HLD, HF, CAD, Afib, COPD, GERD, Obesity   Priority: High     Long-Range Goal: Disease Management   Start Date: 04/04/2021  Recent Progress: Not on track  Priority: High  Note:   Current Barriers:  Unable to independently afford treatment regimen Unable to independently monitor therapeutic efficacy Unable to maintain control of hypertension Suboptimal therapeutic regimen for diabetes Does not adhere to prescribed medication regimen Does not maintain contact with provider office   Pharmacist Clinical Goal(s):  Patient will verbalize ability to afford treatment regimen achieve adherence to monitoring guidelines and medication adherence to achieve therapeutic efficacy achieve control of hypertension as evidenced by BP readings maintain control of diabetes as evidenced by A1c and SMBG values  adhere to plan to optimize therapeutic regimen for diabetes as evidenced by report of adherence to recommended medication management changes adhere to prescribed medication regimen as evidenced by fill dates & patient report contact provider office for questions/concerns as evidenced notation of same in electronic health record through collaboration with PharmD and provider.    Interventions: 1:1 collaboration with [name], MD regarding development and update of comprehensive plan of care as evidenced by provider attestation and co-signature Inter-disciplinary care team collaboration (see longitudinal plan of care) Comprehensive medication review performed; medication list updated in electronic medical record    HypertensionCKD  (BP goal <130/80) BP Readings from Last 3 Encounters:  07/04/22 130/74  04/03/22 (!) 143/79  12/27/21 139/77  -Not ideally controlled -Current treatment: Diltiazem er 180 mg qd Appropriate, Effective, Safe, Accessible Ramipril  10 mg qd Appropriate, Effective, Safe, Accessible Furosemide 20 mg bid Appropriate, Effective, Safe, Accessible Isosorbide Mononitrate er 60 mg qd Appropriate, Effective, Safe, Accessible Metoprolol Succ 7m QD Appropriate, Effective, Safe, Accessible -Medications previously tried: Carvedilol -Current home readings: 130-150/85 -Current dietary habits: Eats out most days, [place] -Current exercise habits: No structured regimen but stays busy around his house -Denies hypotensive/hypertensive symptoms -Educated on BP goals and benefits of medications for prevention of heart attack, stroke and kidney damage; Daily salt intake goal < 2300 mg; Exercise goal of 150 minutes per week; Proper BP monitoring technique; Symptoms of hypotension and importance of maintaining adequate hydration; -Counseled to monitor BP at home daily, document, and provide log at future appointments -Counseled on diet and exercise extensively July 2023: Patient non-compliant on meds. Priority is to improve compliance and then can further assess (Though his BP has been at goal in the past)   Hyperlipidemia/ CAD with angina: (LDL goal <70) The ASCVD Risk score (Arnett DK, et al., 2019) failed to calculate for the following reasons:   The valid total cholesterol range is 130 to 320 mg/dL Lab Results  Component Value Date   CHOL 87 (L) 07/04/2022   CHOL 80 (L) 04/03/2022   CHOL 73 (L) 12/05/2021   Lab Results  Component Value Date   HDL 26 (L) 07/04/2022   HDL 31 (L) 04/03/2022   HDL 24 (L) 12/05/2021   Lab Results  Component Value Date   LDLCALC 29 07/04/2022   LDLCALC 27 04/03/2022   LDLCALC 22 12/05/2021   Lab Results  Component Value Date   TRIG 200 (H) 07/04/2022   TRIG 120 04/03/2022   TRIG 158 (H) 12/05/2021   Lab Results  Component Value Date   CHOLHDL 2.6 04/03/2022  CHOLHDL 3.0 12/05/2021   CHOLHDL 5.5 (H) 08/30/2021  No results found for:  "LDLDIRECT" -Controlled -Current treatment: Atorvastatin 80 mg qd Appropriate, Effective, Safe, Accessible SL NTG 0.4 mg prn Appropriate, Effective, Safe, Accessible -Medications previously tried: NA --Educated on Cholesterol goals;  Benefits of statin for ASCVD risk reduction; Exercise goal of 150 minutes per week; -Counseled on diet and exercise extensively July 2023: Patient non-compliant on meds. Priority is to improve compliance and then can further assess   Diabetes (A1c goal <7%) Lab Results  Component Value Date   HGBA1C 7.6 (H) 07/04/2022   HGBA1C 6.9 (H) 04/03/2022   HGBA1C 6.2 (H) 12/05/2021   Lab Results  Component Value Date   MICROALBUR 80 (H) 12/05/2021   LDLCALC 29 07/04/2022   CREATININE 1.39 (H) 07/04/2022   Lab Results  Component Value Date   NA 141 07/04/2022   K 4.5 07/04/2022   CREATININE 1.39 (H) 07/04/2022   EGFR 54 (L) 07/04/2022   GFRNONAA >60 04/04/2021   GLUCOSE 147 (H) 07/04/2022   Lab Results  Component Value Date   WBC 7.4 11/14/2021   HGB 13.5 11/14/2021   HCT 41.6 11/14/2021   MCV 83.9 11/14/2021   PLT 170 11/14/2021  -Controlled -Current medications: Metformin 1000 xr bid Appropriate, Effective, Safe, Accessible farxiga 5 mg qd Appropriate, Effective, Safe, Query accessible -Medications previously tried: glipizide 5 - hypoglycemia -Current home glucose readings fasting glucose:  July 2023: Patient didn't have readings on him -Denies hypoglycemic/hyperglycemic symptoms --Educated on A1c and blood sugar goals; Complications of diabetes including kidney damage, retinal damage, and cardiovascular disease; Exercise goal of 150 minutes per week; Benefits of routine self-monitoring of blood sugar; Carbohydrate counting and/or plate method -Counseled to check feet daily and get yearly eye exams -Counseled on diet and exercise extensively July 2023: PAP for Wilder Glade (Then will increase dose)  Atrial Fibrillation (Goal: prevent stroke  and major bleeding) -Controlled -CHADSVASC: 5 -Current treatment: Rate control:  Diltiazem er 180 mg qd Appropriate, Effective, Safe, Accessible Ramipril 10 mg qd Appropriate, Effective, Safe, Accessible Furosemide 20 mg bid Appropriate, Effective, Safe, Accessible Isosorbide Mononitrate er 60 mg qd  Appropriate, Effective, Safe, Accessible Metoprolol Succ 40m QD Appropriate, Effective, Safe, Accessible Anticoagulation:  Xarelto 20 mg qd Appropriate, Effective, Safe, Query accessible -Medications previously tried: Toprol -Home BP and HR readings: 130-150s/80s  -Counseled on increased risk of stroke due to Afib and benefits of anticoagulation for stroke prevention; importance of adherence to anticoagulant exactly as prescribed; avoidance of NSAIDs due to increased bleeding risk with anticoagulants; importance of regular laboratory monitoring; seeking medical attention after a head injury or if there is blood in the urine/stool; -Counseled on diet and exercise extensively Recommended to continue current medication Assessed patient finances. Patient will return PAP application for Xarelto  COPD (Goal: control symptoms and prevent exacerbations) -Not ideally controlled -Current treatment  Albuterol Appropriate, Effective, Safe, Accessible Advair Appropriate, Effective, Safe, Query accessible -Medications previously tried: NA  -Gold Grade: Gold 1 (FEV1>80%) -Current COPD Classification:  B (high sx, <2 exacerbations/yr) -Pulmonary function testing:  PF Readings from Last 3 Encounters:  No data found for PF  -Exacerbations requiring treatment in last 6 months: none -Patient reports consistent use of maintenance inhaler -Frequency of rescue inhaler use: several times weekly, More SOB lately-likely multifactoral  -Counseled on Proper inhaler technique; Benefits of consistent maintenance inhaler use When to use rescue inhaler -Counseled on diet and exercise extensively July 2023: PAP  symbicort    Patient Goals/Self-Care Activities Patient  will:  - take medications as prescribed focus on medication adherence by using pill box check glucose fasting and 2 hours after dinner, document, and provide at future appointments check blood pressure daily, document, and provide at future appointments weigh daily, and contact provider if weight gain of 2 lbs overnight or 5 lbs in a week collaborate with provider on medication access solutions target a minimum of 150 minutes of moderate intensity exercise weekly engage in dietary modifications by preparing meals at home 3-5 nights/week  Follow Up Plan: Telephone follow up appointment with care management team member scheduled for: Jan 2023  [place] Constable, Pharm.D. - [phone]         Mr. Resnick was given information about Chronic Care Management services today including:  CCM service includes personalized support from designated clinical staff supervised by his physician, including individualized plan of care and coordination with other care providers 24/7 contact phone numbers for assistance for urgent and routine care needs. Standard insurance, coinsurance, copays and deductibles apply for chronic care management only during months in which we provide at least 20 minutes of these services. Most insurances cover these services at 100%, however patients may be responsible for any copay, coinsurance and/or deductible if applicable. This service may help you avoid the need for more expensive face-to-face services. Only one practitioner may furnish and bill the service in a calendar month. The patient may stop CCM services at any time (effective at the end of the month) by phone call to the office staff.  Patient agreed to services and verbal consent obtained.   The patient verbalized understanding of instructions, educational materials, and care plan provided today and DECLINED offer to receive copy of patient  instructions, educational materials, and care plan.  The pharmacy team will reach out to the patient again over the next 60 days.   Lane Hacker, [place]

## 2022-07-25 ENCOUNTER — Telehealth: Payer: Self-pay

## 2022-07-25 NOTE — Progress Notes (Signed)
Finished onboarding process. Patient non-compliant on meds. Will deliver meds due last month in one week

## 2022-07-25 NOTE — Chronic Care Management (AMB) (Signed)
    Chronic Care Management Pharmacy Assistant   Name: Ulrick Methot  MRN: [id number] DOB: 1951-04-05  Reason for Encounter:Patient assistance application for Epifania Gore and Breztri     Medications: Outpatient Encounter Medications as of 07/25/2022  Medication Sig   ACCU-CHEK GUIDE test strip    albuterol (VENTOLIN HFA) 108 (90 Base) MCG/ACT inhaler Inhale 2 puffs into the lungs every 4 (four) hours as needed for wheezing or shortness of breath. Uses 2-3 times daily   atorvastatin (LIPITOR) 80 MG tablet TAKE 1 TABLET(80 MG) BY MOUTH DAILY   Budeson-Glycopyrrol-Formoterol (BREZTRI AEROSPHERE) 160-9-4.8 MCG/ACT AERO Inhale 2 puffs into the lungs 2 (two) times daily.   clotrimazole-betamethasone (LOTRISONE) cream Apply 1 Application topically 2 (two) times daily.   dapagliflozin propanediol (FARXIGA) 10 MG TABS tablet Take 1 tablet (10 mg total) by mouth daily.   diltiazem (CARDIZEM CD) 180 MG 24 hr capsule Take 1 capsule (180 mg total) by mouth daily.   ezetimibe (ZETIA) 10 MG tablet Take 1 tablet (10 mg total) by mouth daily.   Fluticasone-Salmeterol (ADVAIR) 100-50 MCG/DOSE AEPB Inhale 1 puff into the lungs 2 (two) times daily.   furosemide (LASIX) 40 MG tablet Take 0.5 tablets (20 mg total) by mouth 2 (two) times daily.   gabapentin (NEURONTIN) 600 MG tablet Take 2 tablets (1,200 mg total) by mouth at bedtime.   isosorbide mononitrate (IMDUR) 60 MG 24 hr tablet TAKE 1 TABLET(60 MG) BY MOUTH DAILY   metFORMIN (GLUCOPHAGE-XR) 500 MG 24 hr tablet Take 1 tablet (500 mg total) by mouth in the morning and at bedtime. 1 tab bid   metoprolol succinate (TOPROL-XL) 50 MG 24 hr tablet TAKE 1 TABLET BY MOUTH ONCE DAILY WITH OR IMMEDIATELY FOLLOWING A MEAL   nitroGLYCERIN (NITROSTAT) 0.4 MG SL tablet Place 1 tablet (0.4 mg total) under the tongue every 5 (five) minutes as needed for chest pain.   pantoprazole (PROTONIX) 40 MG tablet TAKE 1 TABLET(40 MG) BY MOUTH DAILY   ramipril (ALTACE) 10  MG capsule Take 1 capsule (10 mg total) by mouth daily.   rivaroxaban (XARELTO) 20 MG TABS tablet Take 1 tablet (20 mg total) by mouth daily with supper.   No facility-administered encounter medications on file as of 07/25/2022.   I have prefilled and mailed out patient assistance application for Farxiga, Xarelto and [employer]. I have explained to the patient about what he needs to do in order to completed the forms. Patient understood and requested a form for his Judithann Sauger inhalor since it is $150 for him. I stated he should be receiving his application by this Friday or Saturday by mail. Patient understood and is aware to bring the forms back to the office when he completes them. I have sent a message to PCP to send next refill scripts to [facility] since patient has switched pharmacies.   Corrie Mckusick, [place]

## 2022-07-31 ENCOUNTER — Other Ambulatory Visit: Payer: Self-pay | Admitting: Nurse Practitioner

## 2022-07-31 DIAGNOSIS — G2581 Restless legs syndrome: Secondary | ICD-10-CM

## 2022-07-31 DIAGNOSIS — I10 Essential (primary) hypertension: Secondary | ICD-10-CM

## 2022-07-31 DIAGNOSIS — I482 Chronic atrial fibrillation, unspecified: Secondary | ICD-10-CM

## 2022-07-31 MED ORDER — RAMIPRIL 10 MG PO CAPS: 10.0000 mg | ORAL_CAPSULE | Freq: Every day | ORAL | 1 refills | Status: DC

## 2022-07-31 MED ORDER — RIVAROXABAN 20 MG PO TABS: 20.0000 mg | ORAL_TABLET | Freq: Every day | ORAL | 3 refills | Status: DC

## 2022-07-31 MED ORDER — EZETIMIBE 10 MG PO TABS
10.0000 mg | ORAL_TABLET | Freq: Every day | ORAL | 3 refills | Status: DC
Start: 2022-07-31 — End: 2023-07-26

## 2022-07-31 MED ORDER — DAPAGLIFLOZIN PROPANEDIOL 10 MG PO TABS
10.0000 mg | ORAL_TABLET | Freq: Every day | ORAL | 1 refills | Status: DC
Start: 2022-07-31 — End: 2023-01-24

## 2022-07-31 MED ORDER — ALBUTEROL SULFATE HFA 108 (90 BASE) MCG/ACT IN AERS: 2.0000 | INHALATION_SPRAY | RESPIRATORY_TRACT | 4 refills | Status: DC | PRN

## 2022-07-31 MED ORDER — METFORMIN HCL ER 500 MG PO TB24: 500.0000 mg | ORAL_TABLET | Freq: Two times a day (BID) | ORAL | 3 refills | Status: DC

## 2022-07-31 MED ORDER — BREZTRI AEROSPHERE 160-9-4.8 MCG/ACT IN AERO: 2.0000 | INHALATION_SPRAY | Freq: Two times a day (BID) | RESPIRATORY_TRACT | 11 refills | Status: DC

## 2022-07-31 MED ORDER — GABAPENTIN 600 MG PO TABS: 1200.0000 mg | ORAL_TABLET | Freq: Every day | ORAL | 1 refills | Status: DC

## 2022-07-31 MED ORDER — ISOSORBIDE MONONITRATE ER 60 MG PO TB24
ORAL_TABLET | ORAL | 1 refills | Status: DC
Start: 2022-07-31 — End: 2023-03-22

## 2022-07-31 MED ORDER — PANTOPRAZOLE SODIUM 40 MG PO TBEC: DELAYED_RELEASE_TABLET | ORAL | 2 refills | Status: DC

## 2022-07-31 MED ORDER — FUROSEMIDE 40 MG PO TABS: 20.0000 mg | ORAL_TABLET | Freq: Two times a day (BID) | ORAL | 1 refills | Status: DC

## 2022-07-31 MED ORDER — DILTIAZEM HCL ER COATED BEADS 180 MG PO CP24: 180.0000 mg | ORAL_CAPSULE | Freq: Every day | ORAL | 3 refills | Status: DC

## 2022-07-31 MED ORDER — ATORVASTATIN CALCIUM 80 MG PO TABS: ORAL_TABLET | ORAL | 1 refills | Status: DC

## 2022-08-01 ENCOUNTER — Telehealth: Payer: Self-pay | Admitting: Nurse Practitioner

## 2022-08-01 NOTE — Telephone Encounter (Signed)
Please route to provider who will be looking into this.

## 2022-08-01 NOTE — Telephone Encounter (Signed)
Pt dropped off patient assistance forms to be completed/signed by provider.  Form is placed in provider's folder.

## 2022-08-01 NOTE — Telephone Encounter (Signed)
Will leave for provider review when returns to office.

## 2022-08-03 NOTE — Telephone Encounter (Signed)
They have already been signed by you

## 2022-08-04 ENCOUNTER — Telehealth: Payer: Self-pay | Admitting: Internal Medicine

## 2022-08-04 NOTE — Telephone Encounter (Signed)
Patient brought in [employer] application to be signed by the provider. Application was placed in the providers folder for signature.

## 2022-08-08 NOTE — Telephone Encounter (Signed)
Forms for [employer] faxed back in for patient.   Called and LVM notifying patient that forms have been sent in for him.

## 2022-08-08 NOTE — Telephone Encounter (Signed)
Reviewed [employer] forms. Everything was signed as it was supposed to be.   Called and notified patient of this.

## 2022-08-08 NOTE — Telephone Encounter (Signed)
Forms completed by [facility] and faxed in to [employer].   Called and LVM letting patient know that this was done.

## 2022-08-08 NOTE — Telephone Encounter (Signed)
Patient called in I let him know forms completed and faxed to [employer] but he states he doesn't know who they are and he thought he needed to sign alsok and he forgot too. Please call back for further assistance

## 2022-08-09 NOTE — Telephone Encounter (Signed)
Forms have been placed in the yellow pharmacy folder.

## 2022-08-09 NOTE — Telephone Encounter (Signed)
Shontae notified via teams

## 2022-08-29 ENCOUNTER — Encounter: Payer: Self-pay | Admitting: Internal Medicine

## 2022-09-14 ENCOUNTER — Ambulatory Visit: Payer: Medicare HMO | Admitting: Dermatology

## 2022-09-20 ENCOUNTER — Telehealth: Payer: Self-pay

## 2022-09-20 NOTE — Progress Notes (Signed)
Chronic Care Management Pharmacy Assistant   Name: Ernest [name]  MRN: [id number] DOB: October 28, 1951  Medications: Outpatient Encounter Medications as of 09/20/2022  Medication Sig   ACCU-CHEK GUIDE test strip    albuterol (VENTOLIN HFA) 108 (90 Base) MCG/ACT inhaler Inhale 2 puffs into the lungs every 4 (four) hours as needed for wheezing or shortness of breath. Uses 2-3 times daily   atorvastatin (LIPITOR) 80 MG tablet TAKE 1 TABLET(80 MG) BY MOUTH DAILY   Budeson-Glycopyrrol-Formoterol (BREZTRI AEROSPHERE) 160-9-4.8 MCG/ACT AERO Inhale 2 puffs into the lungs 2 (two) times daily.   clotrimazole-betamethasone (LOTRISONE) cream Apply 1 Application topically 2 (two) times daily.   dapagliflozin propanediol (FARXIGA) 10 MG TABS tablet Take 1 tablet (10 mg total) by mouth daily.   diltiazem (CARDIZEM CD) 180 MG 24 hr capsule Take 1 capsule (180 mg total) by mouth daily.   ezetimibe (ZETIA) 10 MG tablet Take 1 tablet (10 mg total) by mouth daily.   furosemide (LASIX) 40 MG tablet Take 0.5 tablets (20 mg total) by mouth 2 (two) times daily.   gabapentin (NEURONTIN) 600 MG tablet Take 2 tablets (1,200 mg total) by mouth at bedtime.   isosorbide mononitrate (IMDUR) 60 MG 24 hr tablet TAKE 1 TABLET(60 MG) BY MOUTH DAILY   metFORMIN (GLUCOPHAGE-XR) 500 MG 24 hr tablet Take 1 tablet (500 mg total) by mouth in the morning and at bedtime. 1 tab bid   metoprolol succinate (TOPROL-XL) 50 MG 24 hr tablet TAKE 1 TABLET BY MOUTH ONCE DAILY WITH OR IMMEDIATELY FOLLOWING A MEAL   nitroGLYCERIN (NITROSTAT) 0.4 MG SL tablet Place 1 tablet (0.4 mg total) under the tongue every 5 (five) minutes as needed for chest pain.   pantoprazole (PROTONIX) 40 MG tablet TAKE 1 TABLET(40 MG) BY MOUTH DAILY   ramipril (ALTACE) 10 MG capsule Take 1 capsule (10 mg total) by mouth daily.   rivaroxaban (XARELTO) 20 MG TABS tablet Take 1 tablet (20 mg total) by mouth daily with supper.   No facility-administered encounter  medications on file as of 09/20/2022.    BP Readings from Last 3 Encounters:  07/04/22 130/74  04/03/22 (!) 143/79  12/27/21 139/77    Lab Results  Component Value Date   HGBA1C 7.6 (H) 07/04/2022    Reviewed chart for medication changes ahead of medication coordination call.  No OVs, Consults, or hospital visits since last care coordination call/Pharmacist visit.  No medication changes indicated .  Patient obtains medications through Adherence Packaging  30 Days   Last adherence delivery included: (medication name and frequency) Diltiazem 180 mg 1 tab at B Ezetimibe 10 mg 1 ta at B Metformin 500 mg 1 tab B, L, BT Atorvastatin 80 mg 1 tab at B Farxiga 10 mg 1 tab at B Gabapentin 600 mg 2 tabs at BT Ramipril 10 mg 1 tab at B Xarelto 20 mg 1 tab EM Isosorbide mono er 60 mg 1 tab at B   Patient is due for next adherence delivery on: 09/29/22. Called patient and reviewed medications and coordinated delivery.  This delivery to include: Diltiazem 180 mg 1 tab at B Ezetimibe 10 mg 1 ta at B Metformin 500 mg 1 tab B, L, BT Atorvastatin 80 mg 1 tab at B Farxiga 10 mg 1 tab at B Gabapentin 600 mg 2 tabs at BT Ramipril 10 mg 1 tab at B Xarelto 20 mg 1 tab EM Isosorbide mono er 60 mg 1 tab at B   Confirmed delivery date  of 09/29/22, advised patient that pharmacy will contact them the morning of delivery.    [place] [phone]

## 2022-10-09 NOTE — Progress Notes (Deleted)
Established Patient Office Visit  Name: Ernest [name]   MRN: [id number]    DOB: 1951-06-07   Date:10/10/2022  Today's Provider: Talitha Givens, MHS, PA-C Introduced myself to the patient as a PA-C and provided education on APPs in clinical practice.         Subjective  Chief Complaint  Chief Complaint  Patient presents with   Hyperlipidemia   Hypertension   Diabetes   Atrial Fibrillation    HPI  HYPERTENSION / HYPERLIPIDEMIA Satisfied with current treatment? {Blank single:19197::"yes","no"} Duration of hypertension: {Blank single:19197::"chronic","months","years"} BP monitoring frequency: {Blank single:19197::"not checking","rarely","daily","weekly","monthly","a few times a day","a few times a week","a few times a month"} BP range:  BP medication side effects: {Blank single:19197::"yes","no"} Past BP meds: diltiazem and ramipril Duration of hyperlipidemia: {Blank single:19197::"chronic","months","years"} Cholesterol medication side effects: {Blank single:19197::"yes","no"} Cholesterol supplements: {Blank multiple:19196::"none","fish oil","niacin","red yeast rice"} Past cholesterol medications: atorvastain (lipitor) and ezetimide (zetia) Medication compliance: {Blank single:19197::"excellent compliance","good compliance","fair compliance","poor compliance"} Aspirin: {Blank single:19197::"yes","no"} Recent stressors: {Blank single:19197::"yes","no"} Recurrent headaches: {Blank single:19197::"yes","no"} Visual changes: {Blank single:19197::"yes","no"} Palpitations: {Blank single:19197::"yes","no"} Dyspnea: {Blank single:19197::"yes","no"} Chest pain: {Blank single:19197::"yes","no"} Lower extremity edema: {Blank single:19197::"yes","no"} Dizzy/lightheaded: {Blank single:19197::"yes","no"}   Diabetes, Type 2 - Last A1c 7.6 on 07/04/22 - Medications: Farxiga 10 mg PO QD, metformin 500 mg PO BID,  - Compliance: *** - Checking BG at home: *** - Diet: *** -  Exercise: *** - Eye exam: *** - Foot exam: *** - Microalbumin: *** - Statin: *** - PNA vaccine: *** - Denies symptoms of hypoglycemia, polyuria, polydipsia, numbness extremities, foot ulcers/trauma  COPD COPD status: {Blank single:19197::"controlled","uncontrolled","better","worse","exacerbated","stable"} Satisfied with current treatment?: {Blank single:19197::"yes","no"} Oxygen use: {Blank single:19197::"yes","no"} Dyspnea frequency:  Cough frequency:  Medications: breztri, Albuterol rescue inhaler,  Rescue inhaler frequency:   Limitation of activity: {Blank single:19197::"yes","no"} Productive cough:  Last Spirometry:  Pneumovax: {Blank single:19197::"Up to Date","Not up to Date","unknown"} Influenza: {Blank single:19197::"Up to Date","Not up to Date","unknown"}   A fib Medications: Xarelto 20 mg PO QD, Metoprolol 50 mg PO QD,    Atherosclerosis of native coronary artery  Heart cath in 2020  He is followed by Cardiology with Dr. Rockey Situ   Patient Active Problem List   Diagnosis Date Noted   History of colonic polyps    Arteriovenous malformation of colon    Aortic atherosclerosis ([place]) 03/09/2021   B12 deficiency 12/21/2020   Elevated TSH 11/21/2020   Type 2 diabetes mellitus with morbid obesity ([place]) 11/19/2020   Morbid (severe) obesity due to excess calories ([place]) 04/12/2020   ACS (acute coronary syndrome) ([place]) 03/06/2019   COPD (chronic obstructive pulmonary disease) with emphysema ([place]) 10/10/2018   Gastric AVM    Angiodysplasia of intestinal tract    Iron deficiency anemia due to chronic blood loss 04/25/2018   Advanced care planning/counseling discussion 03/13/2018   Calcification of abdominal aorta ([place]) 02/07/2017   GERD (gastroesophageal reflux disease) 11/25/2015   Restless legs 11/25/2015   BPH (benign prostatic hyperplasia) 11/25/2015   Hypertension associated with diabetes ([place]) 07/06/2015   OSA on CPAP 08/25/2014   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 ([place]) 06/16/2010   Hyperlipidemia associated with type 2 diabetes mellitus ([place]) 06/03/2010   Atrial fibrillation ([place]) 06/03/2010    Past Surgical History:  Procedure Laterality Date   ANKLE SURGERY     CARDIAC CATHETERIZATION  05/2010   [place]  [facility]   CARDIAC CATHETERIZATION  07/2014   [facility]   COLONOSCOPY     COLONOSCOPY WITH PROPOFOL N/A 06/26/2018   Procedure: COLONOSCOPY WITH PROPOFOL;  Surgeon: Virgel Manifold, MD;  Location: [facility] ENDOSCOPY;  Service: Endoscopy;  Laterality: N/A;   COLONOSCOPY WITH PROPOFOL N/A 05/23/2021   Procedure: COLONOSCOPY WITH PROPOFOL;  Surgeon: Virgel Manifold, MD;  Location: [facility] ENDOSCOPY;  Service: Endoscopy;  Laterality: N/A;   CORONARY ARTERY BYPASS GRAFT     CORONARY STENT INTERVENTION N/A 03/07/2019   Procedure: CORONARY STENT INTERVENTION;  Surgeon: Nelva Bush, MD;  Location: [place] [facility];  Service: Cardiovascular;  Laterality: N/A;   ENTEROSCOPY N/A 08/05/2018   Procedure: ENTEROSCOPY;  Surgeon: Virgel Manifold, MD;  Location: [facility] ENDOSCOPY;  Service: Endoscopy;  Laterality: N/A;   ESOPHAGOGASTRODUODENOSCOPY (EGD) WITH PROPOFOL N/A 06/26/2018   Procedure: ESOPHAGOGASTRODUODENOSCOPY (EGD) WITH PROPOFOL;  Surgeon: Virgel Manifold, MD;  Location: [facility] ENDOSCOPY;  Service: Endoscopy;  Laterality: N/A;   ESOPHAGOGASTRODUODENOSCOPY (EGD) WITH PROPOFOL N/A 05/23/2021   Procedure: PUSH ESOPHAGOGASTRODUODENOSCOPY (EGD) WITH PROPOFOL;  Surgeon: Virgel Manifold, MD;  Location: [facility] ENDOSCOPY;  Service: Endoscopy;  Laterality: N/A;   LEFT HEART CATH AND CORONARY ANGIOGRAPHY N/A 03/07/2019   Procedure: LEFT HEART CATH AND CORONARY ANGIOGRAPHY;  Surgeon: Nelva Bush, MD;  Location: [place] [facility];  Service: Cardiovascular;  Laterality: N/A;   TESTICLE SURGERY  70's   TUMOR EXCISION      Neck and finger; benign   WRIST SURGERY      Family History  Problem Relation Age of Onset   Hypertension Mother    Hyperlipidemia Mother    Diabetes Mother    Heart disease Mother        CABG   Pneumonia Father        rare type   Diabetes Other    Depression Other    Coronary artery disease Other    Alcohol abuse Other    Hypertension Other    Hyperlipidemia Other    Cancer Brother    Kidney Stones Brother     Social History   Tobacco Use   Smoking status: Former    Packs/day: 4.00    Years: 28.00    Total pack years: 112.00    Types: Cigarettes    Quit date: 08/25/1993    Years since quitting: 29.1   Smokeless tobacco: Never  Substance Use Topics   Alcohol use: Not Currently     Current Outpatient Medications:    ACCU-CHEK GUIDE test strip, , Disp: , Rfl:    albuterol (VENTOLIN HFA) 108 (90 Base) MCG/ACT inhaler, Inhale 2 puffs into the lungs every 4 (four) hours as needed for wheezing or shortness of breath. Uses 2-3 times daily, Disp: 1 each, Rfl: 4   atorvastatin (LIPITOR) 80 MG tablet, TAKE 1 TABLET(80 MG) BY MOUTH DAILY, Disp: 90 tablet, Rfl: 1   Budeson-Glycopyrrol-Formoterol (BREZTRI AEROSPHERE) 160-9-4.8 MCG/ACT AERO, Inhale 2 puffs into the lungs 2 (two) times daily., Disp: 10.7 g, Rfl: 11   clotrimazole-betamethasone (LOTRISONE) cream, Apply 1 Application topically 2 (two) times daily., Disp: 30 g, Rfl: 2   dapagliflozin propanediol (FARXIGA) 10 MG TABS tablet, Take 1 tablet (10 mg total) by mouth daily., Disp: 90 tablet, Rfl: 1   diltiazem (CARDIZEM CD) 180 MG 24 hr capsule, Take 1 capsule (180 mg total) by mouth daily., Disp: 90 capsule, Rfl: 3   ezetimibe (ZETIA) 10 MG tablet, Take 1 tablet (10 mg total) by mouth daily., Disp:  90 tablet, Rfl: 3   furosemide (LASIX) 40 MG tablet, Take 0.5 tablets (20 mg total) by mouth 2 (two) times daily., Disp: 45 tablet, Rfl: 1   gabapentin (NEURONTIN) 600 MG tablet, Take 2 tablets (1,200 mg total) by mouth at  bedtime., Disp: 180 tablet, Rfl: 1   isosorbide mononitrate (IMDUR) 60 MG 24 hr tablet, TAKE 1 TABLET(60 MG) BY MOUTH DAILY, Disp: 90 tablet, Rfl: 1   metFORMIN (GLUCOPHAGE-XR) 500 MG 24 hr tablet, Take 1 tablet (500 mg total) by mouth in the morning and at bedtime. 1 tab bid, Disp: 200 tablet, Rfl: 3   metoprolol succinate (TOPROL-XL) 50 MG 24 hr tablet, TAKE 1 TABLET BY MOUTH ONCE DAILY WITH OR IMMEDIATELY FOLLOWING A MEAL, Disp: 90 tablet, Rfl: 2   nitroGLYCERIN (NITROSTAT) 0.4 MG SL tablet, Place 1 tablet (0.4 mg total) under the tongue every 5 (five) minutes as needed for chest pain., Disp: 25 tablet, Rfl: 2   pantoprazole (PROTONIX) 40 MG tablet, TAKE 1 TABLET(40 MG) BY MOUTH DAILY, Disp: 90 tablet, Rfl: 2   rivaroxaban (XARELTO) 20 MG TABS tablet, Take 1 tablet (20 mg total) by mouth daily with supper., Disp: 30 tablet, Rfl: 3   ramipril (ALTACE) 10 MG capsule, Take 1 capsule (10 mg total) by mouth daily., Disp: 90 capsule, Rfl: 1  Allergies  Allergen Reactions   Doxycycline Other (See Comments)    Mouth sores     I personally reviewed {Reviewed:14835} with the patient/caregiver today.   ROS    Objective  Vitals:   10/10/22 1049  BP: 127/76  Pulse: 91  Temp: 98.6 F (37 C)  TempSrc: Oral  SpO2: 96%  Weight: 247 lb (112 kg)  Height: 5' 10.87" (1.8 m)    Body mass index is 34.58 kg/m.  Physical Exam   Recent Results (from the past 2160 hour(s))  Bayer DCA Hb A1c Waived (STAT)     Status: Abnormal   Collection Time: 10/10/22 11:50 AM  Result Value Ref Range   HB A1C (BAYER DCA - WAIVED) 7.2 (H) 4.8 - 5.6 %    Comment:          Prediabetes: 5.7 - 6.4          Diabetes: >6.4          Glycemic control for adults with diabetes: <7.0      PHQ2/9:    04/03/2022   10:46 AM 02/15/2022   10:26 AM 12/13/2021   10:32 AM 09/12/2021    2:59 PM 06/07/2021   10:11 AM  Depression screen PHQ 2/9  Decreased Interest 0 0 0 0 0  Down, Depressed, Hopeless 0 0 0 0 0  PHQ - 2  Score 0 0 0 0 0  Altered sleeping 0  0 0   Tired, decreased energy 0  0 0   Change in appetite 0  0 0   Feeling bad or failure about yourself  0  0 0   Trouble concentrating 0  0 0   Moving slowly or fidgety/restless 0  0 0   Suicidal thoughts 0  0 0   PHQ-9 Score 0  0 0   Difficult doing work/chores Not difficult at all  Not difficult at all        Fall Risk:    10/10/2022   10:51 AM 04/03/2022   10:46 AM 02/15/2022   10:24 AM 12/13/2021   10:30 AM 09/12/2021    2:58 PM  Fall Risk   Falls  in the past year? 1 0 0 0 0  Number falls in past yr: 1 0 0 0 0  Injury with Fall? 1 0 0 0 0  Risk for fall due to : No Fall Risks No Fall Risks   No Fall Risks  Follow up Falls evaluation completed Falls evaluation completed Falls evaluation completed;Education provided;Falls prevention discussed Falls evaluation completed Falls evaluation completed      Functional Status Survey:      Assessment & Plan

## 2022-10-10 ENCOUNTER — Ambulatory Visit (INDEPENDENT_AMBULATORY_CARE_PROVIDER_SITE_OTHER): Payer: Medicare HMO | Admitting: Physician Assistant

## 2022-10-10 ENCOUNTER — Encounter: Payer: Self-pay | Admitting: Physician Assistant

## 2022-10-10 DIAGNOSIS — I4811 Longstanding persistent atrial fibrillation: Secondary | ICD-10-CM | POA: Diagnosis not present

## 2022-10-10 DIAGNOSIS — E1159 Type 2 diabetes mellitus with other circulatory complications: Secondary | ICD-10-CM

## 2022-10-10 DIAGNOSIS — Z23 Encounter for immunization: Secondary | ICD-10-CM | POA: Diagnosis not present

## 2022-10-10 DIAGNOSIS — J3089 Other allergic rhinitis: Secondary | ICD-10-CM | POA: Diagnosis not present

## 2022-10-10 DIAGNOSIS — I152 Hypertension secondary to endocrine disorders: Secondary | ICD-10-CM

## 2022-10-10 DIAGNOSIS — J439 Emphysema, unspecified: Secondary | ICD-10-CM

## 2022-10-10 DIAGNOSIS — E1169 Type 2 diabetes mellitus with other specified complication: Secondary | ICD-10-CM | POA: Diagnosis not present

## 2022-10-10 LAB — BAYER DCA HB A1C WAIVED: HB A1C (BAYER DCA - WAIVED): 7.2 % — ABNORMAL HIGH (ref 4.8–5.6)

## 2022-10-10 MED ORDER — RAMIPRIL 10 MG PO CAPS: 10.0000 mg | ORAL_CAPSULE | Freq: Every day | ORAL | 1 refills | Status: DC

## 2022-10-10 NOTE — Assessment & Plan Note (Addendum)
Chronic, historic condition  Appears well controlled on current regimen comprised of Ventolin  and Breztri inhalers Continue current regimen  Last spirometry completed 07/04/22 with normal results  Recommend follow up in 3 months for monitoring or sooner if concerns arise

## 2022-10-10 NOTE — Assessment & Plan Note (Signed)
Chronic, historic condition Currently taking Xarelto for anticoag along with Metoprolol for rate control Appears to be tolerating well  Reports some palpitations that resolve with rest  See Cardiology with Dr. Rockey Situ Continue collaboration with cardiology  Follow up in 3 months for monitoring

## 2022-10-10 NOTE — Patient Instructions (Addendum)
I'd like you to make sure you are staying well hydrated - drink at least 65-75 oz of water per day  Make sure you are changing positions, going from sitting to standing- slowly so you don't get dizzy or fall.     It was nice to meet you and I appreciate the opportunity to be involved in your care If you were satisfied with the care you received from me, I would greatly appreciate you saying so in the after-visit survey that is sent out following our visit.

## 2022-10-10 NOTE — Progress Notes (Signed)
Established Patient Office Visit    Name: Ernest [name]   MRN: [id number]    DOB: 03-Dec-1951   Date:10/10/2022   Today's Provider: Talitha Givens, MHS, PA-C Introduced myself to the patient as a PA-C and provided education on APPs in clinical practice.                                                                 Subjective   Chief Complaint      Chief Complaint  Patient presents with   Hyperlipidemia   Hypertension   Diabetes   Atrial Fibrillation      HPI   HYPERTENSION / HYPERLIPIDEMIA Satisfied with current treatment? yes Duration of hypertension: years BP monitoring frequency: a few times a month BP range: Reports some days its high, 140s/80s, some days its normal - usually elevated in response to stress BP medication side effects: no Past BP meds: diltiazem and ramipril Duration of hyperlipidemia: years Cholesterol medication side effects: no Cholesterol supplements: none Past cholesterol medications: atorvastain (lipitor) and ezetimide (zetia) Medication compliance: excellent compliance Aspirin: no Recent stressors: yes Recurrent headaches: no Visual changes: no- but does have blurry vision from cataracts  Palpitations: yes- usually calms down once he sits and rests  Dyspnea: no Chest pain: no Lower extremity edema: no Dizzy/lightheaded: yes with ambulation but resolves after a few seconds    Diabetes, Type 2 - Last A1c 7.6 on 07/04/22 - Medications: Farxiga 10 mg PO QD, metformin 500 mg PO BID,  - Compliance: excellent - Checking BG at home: Checks but has not been able to find meter lately  - Diet: Overall normal diet - no exclusions  - Exercise: Stays active  - Eye exam: up to date, next due in 2024 - Foot exam: postponed to next visit - Microalbumin: Up to date  - Statin: On statin therapy  - PNA vaccine: Completed  - Denies symptoms of hypoglycemia, polyuria, polydipsia, numbness extremities, foot ulcers/trauma   COPD Stopped smoking  about 29 years ago  COPD status: stable Satisfied with current treatment?: yes Oxygen use: no Dyspnea frequency: about once per week - resolves with rescue  Cough frequency: sometimes  Medications: breztri, Albuterol rescue inhaler,  Rescue inhaler frequency:  about once or twice a week  Limitation of activity: no Productive cough: none Last Spirometry: 07/04/22 Pneumovax: Up to Date Influenza: Up to Date     A fib Medications: Xarelto 20 mg PO QD, Metoprolol 50 mg PO QD,  Atherosclerosis of native coronary artery  Heart cath in 2020  He is followed by Cardiology with Dr. Rockey Situ         Patient Active Problem List    Diagnosis Date Noted   History of colonic polyps     Arteriovenous malformation of colon     Aortic atherosclerosis ([place]) 03/09/2021   B12 deficiency 12/21/2020   Elevated TSH 11/21/2020   Type 2 diabetes mellitus with morbid obesity ([place]) 11/19/2020   Morbid (severe) obesity due to excess calories ([place]) 04/12/2020   ACS (acute coronary syndrome) ([place]) 03/06/2019   COPD (chronic obstructive pulmonary disease) with emphysema ([place]) 10/10/2018   Gastric AVM     Angiodysplasia of intestinal tract     Iron deficiency anemia due to chronic blood loss  04/25/2018   Advanced care planning/counseling discussion 03/13/2018   Calcification of abdominal aorta ([place]) 02/07/2017   GERD (gastroesophageal reflux disease) 11/25/2015   Restless legs 11/25/2015   BPH (benign prostatic hyperplasia) 11/25/2015   Hypertension associated with diabetes ([place]) 07/06/2015   OSA on CPAP 08/25/2014   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 ([place]) 06/16/2010   Hyperlipidemia associated with type 2 diabetes mellitus ([place]) 06/03/2010   Atrial fibrillation ([place]) 06/03/2010           Past Surgical History:  Procedure Laterality Date   ANKLE SURGERY       CARDIAC CATHETERIZATION   05/2010    [facility]   CARDIAC CATHETERIZATION         [place]        [facility]   CARDIAC CATHETERIZATION   07/2014    [facility]   COLONOSCOPY       COLONOSCOPY WITH PROPOFOL N/A 06/26/2018    Procedure: COLONOSCOPY WITH PROPOFOL;  Surgeon: Virgel Manifold, MD;  Location: [facility] ENDOSCOPY;  Service: Endoscopy;  Laterality: N/A;   COLONOSCOPY WITH PROPOFOL N/A 05/23/2021    Procedure: COLONOSCOPY WITH PROPOFOL;  Surgeon: Virgel Manifold, MD;  Location: [facility] ENDOSCOPY;  Service: Endoscopy;  Laterality: N/A;   CORONARY ARTERY BYPASS GRAFT       CORONARY STENT INTERVENTION N/A 03/07/2019    Procedure: CORONARY STENT INTERVENTION;  Surgeon: Nelva Bush, MD;  Location: [place] CV LAB;  Service: Cardiovascular;  Laterality: N/A;   ENTEROSCOPY N/A 08/05/2018    Procedure: ENTEROSCOPY;  Surgeon: Virgel Manifold, MD;  Location: [facility] ENDOSCOPY;  Service: Endoscopy;  Laterality: N/A;   ESOPHAGOGASTRODUODENOSCOPY (EGD) WITH PROPOFOL N/A 06/26/2018    Procedure: ESOPHAGOGASTRODUODENOSCOPY (EGD) WITH PROPOFOL;  Surgeon: Virgel Manifold, MD;  Location: [facility] ENDOSCOPY;  Service: Endoscopy;  Laterality: N/A;   ESOPHAGOGASTRODUODENOSCOPY (EGD) WITH PROPOFOL N/A 05/23/2021    Procedure: PUSH ESOPHAGOGASTRODUODENOSCOPY (EGD) WITH PROPOFOL;  Surgeon: Virgel Manifold, MD;  Location: [facility] ENDOSCOPY;  Service: Endoscopy;  Laterality: N/A;   LEFT HEART CATH AND CORONARY ANGIOGRAPHY N/A 03/07/2019    Procedure: LEFT HEART CATH AND CORONARY ANGIOGRAPHY;  Surgeon: Nelva Bush, MD;  Location: [place] [facility];  Service: Cardiovascular;  Laterality: N/A;   TESTICLE SURGERY   70's   TUMOR EXCISION        Neck and finger; benign   WRIST SURGERY               Family History  Problem Relation Age of Onset   Hypertension Mother     Hyperlipidemia Mother     Diabetes Mother     Heart disease Mother          CABG   Pneumonia Father          rare type   Diabetes Other     Depression Other     Coronary artery disease Other      Alcohol abuse Other     Hypertension Other     Hyperlipidemia Other     Cancer Brother     Kidney Stones Brother        Social History         Tobacco Use   Smoking status: Former      Packs/day: 4.00      Years: 28.00      Total pack years: 112.00      Types: Cigarettes      Quit date:  08/25/1993      Years since quitting: 29.1   Smokeless tobacco: Never  Substance Use Topics   Alcohol use: Not Currently        Current Outpatient Medications:    ACCU-CHEK GUIDE test strip, , Disp: , Rfl:    albuterol (VENTOLIN HFA) 108 (90 Base) MCG/ACT inhaler, Inhale 2 puffs into the lungs every 4 (four) hours as needed for wheezing or shortness of breath. Uses 2-3 times daily, Disp: 1 each, Rfl: 4   atorvastatin (LIPITOR) 80 MG tablet, TAKE 1 TABLET(80 MG) BY MOUTH DAILY, Disp: 90 tablet, Rfl: 1   Budeson-Glycopyrrol-Formoterol (BREZTRI AEROSPHERE) 160-9-4.8 MCG/ACT AERO, Inhale 2 puffs into the lungs 2 (two) times daily., Disp: 10.7 g, Rfl: 11   clotrimazole-betamethasone (LOTRISONE) cream, Apply 1 Application topically 2 (two) times daily., Disp: 30 g, Rfl: 2   dapagliflozin propanediol (FARXIGA) 10 MG TABS tablet, Take 1 tablet (10 mg total) by mouth daily., Disp: 90 tablet, Rfl: 1   diltiazem (CARDIZEM CD) 180 MG 24 hr capsule, Take 1 capsule (180 mg total) by mouth daily., Disp: 90 capsule, Rfl: 3   ezetimibe (ZETIA) 10 MG tablet, Take 1 tablet (10 mg total) by mouth daily., Disp: 90 tablet, Rfl: 3   furosemide (LASIX) 40 MG tablet, Take 0.5 tablets (20 mg total) by mouth 2 (two) times daily., Disp: 45 tablet, Rfl: 1   gabapentin (NEURONTIN) 600 MG tablet, Take 2 tablets (1,200 mg total) by mouth at bedtime., Disp: 180 tablet, Rfl: 1   isosorbide mononitrate (IMDUR) 60 MG 24 hr tablet, TAKE 1 TABLET(60 MG) BY MOUTH DAILY, Disp: 90 tablet, Rfl: 1   metFORMIN (GLUCOPHAGE-XR) 500 MG 24 hr tablet, Take 1 tablet (500 mg total) by mouth in the morning and at bedtime. 1 tab bid, Disp: 200  tablet, Rfl: 3   metoprolol succinate (TOPROL-XL) 50 MG 24 hr tablet, TAKE 1 TABLET BY MOUTH ONCE DAILY WITH OR IMMEDIATELY FOLLOWING A MEAL, Disp: 90 tablet, Rfl: 2   nitroGLYCERIN (NITROSTAT) 0.4 MG SL tablet, Place 1 tablet (0.4 mg total) under the tongue every 5 (five) minutes as needed for chest pain., Disp: 25 tablet, Rfl: 2   pantoprazole (PROTONIX) 40 MG tablet, TAKE 1 TABLET(40 MG) BY MOUTH DAILY, Disp: 90 tablet, Rfl: 2   rivaroxaban (XARELTO) 20 MG TABS tablet, Take 1 tablet (20 mg total) by mouth daily with supper., Disp: 30 tablet, Rfl: 3   ramipril (ALTACE) 10 MG capsule, Take 1 capsule (10 mg total) by mouth daily., Disp: 90 capsule, Rfl: 1        Allergies  Allergen Reactions   Doxycycline Other (See Comments)      Mouth sores       I personally reviewed active problem list, medication list, allergies, health maintenance, notes from last encounter, lab results with the patient/caregiver today.     Review of Systems  Constitutional:  Negative for chills and fever.  HENT:  Negative for ear pain, hearing loss and tinnitus.   Eyes:  Positive for blurred vision. Negative for double vision.  Respiratory:  Negative for shortness of breath and wheezing.   Cardiovascular:  Positive for palpitations. Negative for chest pain and leg swelling.  Musculoskeletal:  Negative for falls.  Neurological:  Positive for dizziness (reports rushing in ears with dizziness). Negative for tingling, loss of consciousness and headaches.          Objective      Vitals:    10/10/22 1049  BP: 127/76  Pulse:  91  Temp: 98.6 F (37 C)  TempSrc: Oral  SpO2: 96%  Weight: 247 lb (112 kg)  Height: 5' 10.87" (1.8 m)      Body mass index is 34.58 kg/m.   Physical Exam Vitals reviewed.  Constitutional:      General: He is awake.     Appearance: Normal appearance. He is well-developed and well-groomed.  HENT:     Head: Normocephalic and atraumatic.  Eyes:     General: Lids are normal.  Gaze aligned appropriately.     Extraocular Movements: Extraocular movements intact.     Conjunctiva/sclera: Conjunctivae normal.  Cardiovascular:     Rate and Rhythm: Normal rate. Rhythm regularly irregular.     Pulses: Normal pulses.          Radial pulses are 2+ on the right side and 2+ on the left side.     Heart sounds: Normal heart sounds. No murmur heard.    No friction rub. No gallop.  Pulmonary:     Effort: Pulmonary effort is normal.     Breath sounds: Normal breath sounds. No stridor or decreased air movement. No decreased breath sounds, wheezing, rhonchi or rales.  Musculoskeletal:     Right lower leg: No edema.     Left lower leg: No edema.  Neurological:     General: No focal deficit present.     Mental Status: He is alert and oriented to person, place, and time.     GCS: GCS eye subscore is 4. GCS verbal subscore is 5. GCS motor subscore is 6.     Cranial Nerves: Cranial nerves 2-12 are intact. No dysarthria or facial asymmetry.     Motor: Motor function is intact. No weakness or tremor.  Psychiatric:        Attention and Perception: Attention and perception normal.        Mood and Affect: Mood and affect normal.        Speech: Speech normal.        Behavior: Behavior normal. Behavior is cooperative.                Recent Results (from the past 2160 hour(s))  Bayer DCA Hb A1c Waived (STAT)     Status: Abnormal    Collection Time: 10/10/22 11:50 AM  Result Value Ref Range    HB A1C (BAYER DCA - WAIVED) 7.2 (H) 4.8 - 5.6 %      Comment:          Prediabetes: 5.7 - 6.4          Diabetes: >6.4          Glycemic control for adults with diabetes: <7.0          PHQ2/9:     04/03/2022   10:46 AM 02/15/2022   10:26 AM 12/13/2021   10:32 AM 09/12/2021    2:59 PM 06/07/2021   10:11 AM  Depression screen PHQ 2/9  Decreased Interest 0 0 0 0 0  Down, Depressed, Hopeless 0 0 0 0 0  PHQ - 2 Score 0 0 0 0 0  Altered sleeping 0   0 0    Tired, decreased energy 0   0 0     Change in appetite 0   0 0    Feeling bad or failure about yourself  0   0 0    Trouble concentrating 0   0 0    Moving slowly or fidgety/restless 0   0  0    Suicidal thoughts 0   0 0    PHQ-9 Score 0   0 0    Difficult doing work/chores Not difficult at all   Not difficult at all            Fall Risk:     10/10/2022   10:51 AM 04/03/2022   10:46 AM 02/15/2022   10:24 AM 12/13/2021   10:30 AM 09/12/2021    2:58 PM  Fall Risk   Falls in the past year? 1 0 0 0 0  Number falls in past yr: 1 0 0 0 0  Injury with Fall? 1 0 0 0 0  Risk for fall due to : No Fall Risks No Fall Risks     No Fall Risks  Follow up Falls evaluation completed Falls evaluation completed Falls evaluation completed;Education provided;Falls prevention discussed Falls evaluation completed Falls evaluation completed          Functional Status Survey:  Problem List Items Addressed This Visit       Cardiovascular and Mediastinum   Atrial fibrillation ([place])    Chronic, historic condition Currently taking Xarelto for anticoag along with Metoprolol for rate control Appears to be tolerating well  Reports some palpitations that resolve with rest  See Cardiology with Dr. Rockey Situ Continue collaboration with cardiology  Follow up in 3 months for monitoring       Relevant Medications   ramipril (ALTACE) 10 MG capsule   Hypertension associated with diabetes ([place])    Chronic, historic condition Currently taking Ramipril 10 mg PO QD, Diltiazem 180 mg PO QD, Furosemide 40 mg PO BID, Metoprolol 50 mg PO QD Appears to be tolerating well He is followed by Cardiology - continue collaboration Follow up in 3 months for monitoring       Relevant Medications   ramipril (ALTACE) 10 MG capsule     Respiratory   COPD (chronic obstructive pulmonary disease) with emphysema ([facility])    Chronic, historic condition  Appears well controlled on current regimen comprised of Ventolin  and Breztri inhalers Continue current regimen   Last spirometry completed 07/04/22 with normal results  Recommend follow up in 3 months for monitoring or sooner if concerns arise        Endocrine   Type 2 diabetes mellitus with morbid obesity ([place]) - Primary    Chronic, historic condition Appears well controlled on Farxiga 10 mg PO QD and Metformin 500 mg PO BID  Recheck A1c today : 7.2 which is improved from previous Continue current regimen Will continue to monitor A1c every 3 months and adjust regimen as indicated.  Follow up in 3 months       Relevant Medications   ramipril (ALTACE) 10 MG capsule   Other Relevant Orders   Bayer DCA Hb A1c Waived (STAT) (Completed)   Other Visit Diagnoses     Environmental and seasonal allergies       Need for influenza vaccination       Relevant Orders   Flu Vaccine QUAD High Dose(Fluad) (Completed)        Return in about 3 months (around 01/10/2023) for HTN, Diabetes follow up, a fib, .   I, Cougar Imel E Keierra Nudo, PA-C, have reviewed all documentation for this visit. The documentation on 10/10/22 for the exam, diagnosis, procedures, and orders are all accurate and complete.   Talitha Givens, MHS, PA-C [facility]

## 2022-10-10 NOTE — Assessment & Plan Note (Signed)
Chronic, historic condition Appears well controlled on Farxiga 10 mg PO QD and Metformin 500 mg PO BID  Recheck A1c today : 7.2 which is improved from previous Continue current regimen Will continue to monitor A1c every 3 months and adjust regimen as indicated.  Follow up in 3 months

## 2022-10-10 NOTE — Assessment & Plan Note (Addendum)
Chronic, historic condition Currently taking Ramipril 10 mg PO QD, Diltiazem 180 mg PO QD, Furosemide 40 mg PO BID, Metoprolol 50 mg PO QD Appears to be tolerating well He is followed by Cardiology - continue collaboration Follow up in 3 months for monitoring

## 2022-10-19 ENCOUNTER — Telehealth: Payer: Self-pay

## 2022-10-19 ENCOUNTER — Other Ambulatory Visit: Payer: Self-pay | Admitting: Nurse Practitioner

## 2022-10-19 NOTE — Telephone Encounter (Signed)
Medication Refill - Medication: pantoprazole (PROTONIX) 40 MG tablet    Has the patient contacted their pharmacy? Yes.   (Agent: If no, request that the patient contact the pharmacy for the refill. If patient does not wish to contact the pharmacy document the reason why and proceed with request.) (Agent: If yes, when and what did the pharmacy advise?)  Preferred Pharmacy (with phone number or street name): [facility] - [place], [name] - [address] Phone: [phone]  Fax: [phone]   Has the patient been seen for an appointment in the last year OR does the patient have an upcoming appointment? Yes.    Agent: Please be advised that RX refills may take up to 3 business days. We ask that you follow-up with your pharmacy.

## 2022-10-19 NOTE — Chronic Care Management (AMB) (Signed)
Chronic Care Management Pharmacy Assistant   Name: Ernest [name]  MRN: [id number] DOB: 07-Sep-1951   Reason for Encounter: Medication Review     Medications: Outpatient Encounter Medications as of 10/19/2022  Medication Sig   ACCU-CHEK GUIDE test strip    albuterol (VENTOLIN HFA) 108 (90 Base) MCG/ACT inhaler Inhale 2 puffs into the lungs every 4 (four) hours as needed for wheezing or shortness of breath. Uses 2-3 times daily   atorvastatin (LIPITOR) 80 MG tablet TAKE 1 TABLET(80 MG) BY MOUTH DAILY   Budeson-Glycopyrrol-Formoterol (BREZTRI AEROSPHERE) 160-9-4.8 MCG/ACT AERO Inhale 2 puffs into the lungs 2 (two) times daily.   clotrimazole-betamethasone (LOTRISONE) cream Apply 1 Application topically 2 (two) times daily.   dapagliflozin propanediol (FARXIGA) 10 MG TABS tablet Take 1 tablet (10 mg total) by mouth daily.   diltiazem (CARDIZEM CD) 180 MG 24 hr capsule Take 1 capsule (180 mg total) by mouth daily.   ezetimibe (ZETIA) 10 MG tablet Take 1 tablet (10 mg total) by mouth daily.   furosemide (LASIX) 40 MG tablet Take 0.5 tablets (20 mg total) by mouth 2 (two) times daily.   gabapentin (NEURONTIN) 600 MG tablet Take 2 tablets (1,200 mg total) by mouth at bedtime.   isosorbide mononitrate (IMDUR) 60 MG 24 hr tablet TAKE 1 TABLET(60 MG) BY MOUTH DAILY   metFORMIN (GLUCOPHAGE-XR) 500 MG 24 hr tablet Take 1 tablet (500 mg total) by mouth in the morning and at bedtime. 1 tab bid   metoprolol succinate (TOPROL-XL) 50 MG 24 hr tablet TAKE 1 TABLET BY MOUTH ONCE DAILY WITH OR IMMEDIATELY FOLLOWING A MEAL   nitroGLYCERIN (NITROSTAT) 0.4 MG SL tablet Place 1 tablet (0.4 mg total) under the tongue every 5 (five) minutes as needed for chest pain.   pantoprazole (PROTONIX) 40 MG tablet TAKE 1 TABLET(40 MG) BY MOUTH DAILY   ramipril (ALTACE) 10 MG capsule Take 1 capsule (10 mg total) by mouth daily.   rivaroxaban (XARELTO) 20 MG TABS tablet Take 1 tablet (20 mg total) by mouth daily with  supper.   No facility-administered encounter medications on file as of 10/19/2022.    BP Readings from Last 3 Encounters:  10/10/22 127/76  07/04/22 130/74  04/03/22 (!) 143/79    Lab Results  Component Value Date   HGBA1C 7.2 (H) 10/10/2022    Reviewed chart for medication changes ahead of medication coordination call.  No OVs, Consults, or hospital visits since last care coordination call/Pharmacist visit.   No medication changes indicated   Patient obtains medications through Adherence Packaging  30 Days   Last adherence delivery included:  Diltiazem 180 mg 1 tab at B Ezetimibe 10 mg 1 ta at B Metformin 500 mg 1 tab B, L, BT Atorvastatin 80 mg 1 tab at B Farxiga 10 mg 1 tab at B (Gets from the manufacturer) Gabapentin 600 mg 2 tabs at BT Ramipril 10 mg 1 tab at B Xarelto 20 mg 1 tab EM Isosorbide mono er 60 mg 1 tab at B   Patient is due for next adherence delivery on: 10/30/22 . Called patient and reviewed medications and coordinated delivery. This delivery to include: Diltiazem 180 mg 1 tab at B Ezetimibe 10 mg 1 ta at B Metformin 500 mg 1 tab B, L, BT Atorvastatin 80 mg 1 tab at B Farxiga 10 mg 1 tab at B (Gets from the manufacturer) Gabapentin 600 mg 2 tabs at BT Ramipril 10 mg 1 tab at B Xarelto 20 mg 1  tab EM Isosorbide mono er 60 mg 1 tab at B   Patient needs refills for metoprolol succinate 50 mg.  Confirmed delivery date of 10/30/22, advised patient that pharmacy will contact them the morning of delivery.   [place] [phone]

## 2022-10-19 NOTE — Telephone Encounter (Signed)
Rx 07/31/22 #90 2RF- too soon Requested Prescriptions  Pending Prescriptions Disp Refills   pantoprazole (PROTONIX) 40 MG tablet 90 tablet 2    Sig: TAKE 1 TABLET(40 MG) BY MOUTH DAILY     Gastroenterology: Proton Pump Inhibitors Passed - 10/19/2022  9:20 AM      Passed - Valid encounter within last 12 months    Recent Outpatient Visits           1 week ago Type 2 diabetes mellitus with morbid obesity ([place])   [place], Erin E, PA-C   3 months ago Type 2 diabetes mellitus with morbid obesity ([place])   [facility], Malachy Mood, NP   6 months ago Type 2 diabetes mellitus with morbid obesity ([place])   [facility] [name], [name], MD   9 months ago Hyperlipidemia associated with type 2 diabetes mellitus ([place])   [facility] [name], [name], MD   10 months ago Restless legs   [facility], MD       Future Appointments             In 2 months Mecum, Dani Gobble, PA-C [employer], [facility]

## 2022-11-17 ENCOUNTER — Telehealth: Payer: Self-pay

## 2022-11-17 NOTE — Progress Notes (Signed)
Chronic Care Management Pharmacy Assistant   Name: Ernest [name]  MRN: [id number] DOB: 1951/03/18   Medications: Outpatient Encounter Medications as of 11/17/2022  Medication Sig   ACCU-CHEK GUIDE test strip    albuterol (VENTOLIN HFA) 108 (90 Base) MCG/ACT inhaler Inhale 2 puffs into the lungs every 4 (four) hours as needed for wheezing or shortness of breath. Uses 2-3 times daily   atorvastatin (LIPITOR) 80 MG tablet TAKE 1 TABLET(80 MG) BY MOUTH DAILY   Budeson-Glycopyrrol-Formoterol (BREZTRI AEROSPHERE) 160-9-4.8 MCG/ACT AERO Inhale 2 puffs into the lungs 2 (two) times daily.   clotrimazole-betamethasone (LOTRISONE) cream Apply 1 Application topically 2 (two) times daily.   dapagliflozin propanediol (FARXIGA) 10 MG TABS tablet Take 1 tablet (10 mg total) by mouth daily.   diltiazem (CARDIZEM CD) 180 MG 24 hr capsule Take 1 capsule (180 mg total) by mouth daily.   ezetimibe (ZETIA) 10 MG tablet Take 1 tablet (10 mg total) by mouth daily.   furosemide (LASIX) 40 MG tablet Take 0.5 tablets (20 mg total) by mouth 2 (two) times daily.   gabapentin (NEURONTIN) 600 MG tablet Take 2 tablets (1,200 mg total) by mouth at bedtime.   isosorbide mononitrate (IMDUR) 60 MG 24 hr tablet TAKE 1 TABLET(60 MG) BY MOUTH DAILY   metFORMIN (GLUCOPHAGE-XR) 500 MG 24 hr tablet Take 1 tablet (500 mg total) by mouth in the morning and at bedtime. 1 tab bid   metoprolol succinate (TOPROL-XL) 50 MG 24 hr tablet TAKE 1 TABLET BY MOUTH ONCE DAILY WITH OR IMMEDIATELY FOLLOWING A MEAL   nitroGLYCERIN (NITROSTAT) 0.4 MG SL tablet Place 1 tablet (0.4 mg total) under the tongue every 5 (five) minutes as needed for chest pain.   pantoprazole (PROTONIX) 40 MG tablet TAKE 1 TABLET(40 MG) BY MOUTH DAILY   ramipril (ALTACE) 10 MG capsule Take 1 capsule (10 mg total) by mouth daily.   rivaroxaban (XARELTO) 20 MG TABS tablet Take 1 tablet (20 mg total) by mouth daily with supper.   No facility-administered encounter  medications on file as of 11/17/2022.    Reviewed chart for medication changes ahead of medication coordination call.  No OVs, Consults, or hospital visits since last care coordination call/Pharmacist visit. (If appropriate, list visit date, provider name)  No medication changes indicated OR if recent visit, treatment plan here.  BP Readings from Last 3 Encounters:  10/10/22 127/76  07/04/22 130/74  04/03/22 (!) 143/79    Lab Results  Component Value Date   HGBA1C 7.2 (H) 10/10/2022    Reviewed chart for medication changes ahead of medication coordination call.  No OVs, Consults, or hospital visits since last care coordination call/Pharmacist visit.   No medication changes indicated   Patient obtains medications through Adherence Packaging  30 Days   Last adherence delivery included:  Diltiazem 180 mg 1 tab at B Ezetimibe 10 mg 1 ta at B Metformin 500 mg 1 tab B, L, BT Atorvastatin 80 mg 1 tab at B Farxiga 10 mg 1 tab at B (Gets from the manufacturer) Gabapentin 600 mg 2 tabs at BT Ramipril 10 mg 1 tab at B Xarelto 20 mg 1 tab EM Isosorbide mono er 60 mg 1 tab at B   Patient is due for next adherence delivery on: 11/29/22.  Called patient and reviewed medications and coordinated delivery. This delivery to include: Diltiazem 180 mg 1 tab at B Ezetimibe 10 mg 1 ta at B Metformin 500 mg 1 tab B, L, BT Atorvastatin 80  mg 1 tab at B Farxiga 10 mg 1 tab at B (Gets from the manufacturer) Gabapentin 600 mg 2 tabs at BT Ramipril 10 mg 1 tab at B Xarelto 20 mg 1 tab EM Isosorbide mono er 60 mg 1 tab at B   Confirmed delivery date of 11/29/22, advised patient that pharmacy will contact them the morning of delivery.   [place] [phone]

## 2022-11-22 ENCOUNTER — Other Ambulatory Visit: Payer: Self-pay | Admitting: Nurse Practitioner

## 2022-11-22 DIAGNOSIS — I482 Chronic atrial fibrillation, unspecified: Secondary | ICD-10-CM

## 2022-11-22 NOTE — Telephone Encounter (Signed)
Requested Prescriptions  Pending Prescriptions Disp Refills   furosemide (LASIX) 20 MG tablet [Pharmacy Med Name: furosemide 20 mg tablet] 90 tablet 0    Sig: TAKE ONE TABLET BY MOUTH EVERY MORNING and TAKE ONE TABLET BY MOUTH EVERYDAY AT BEDTIME     Cardiovascular:  Diuretics - Loop Failed - 11/22/2022 11:04 AM      Failed - Cr in normal range and within 180 days    Creatinine  Date Value Ref Range Status  07/25/2014 1.21 0.60 - 1.30 mg/dL Final   Creatinine, Ser  Date Value Ref Range Status  07/04/2022 1.39 (H) 0.76 - 1.27 mg/dL Final         Failed - Mg Level in normal range and within 180 days    Magnesium  Date Value Ref Range Status  07/25/2014 1.6 (L) mg/dL Final    Comment:    1.8-2.4 THERAPEUTIC RANGE: 4-7 mg/dL TOXIC: > 10 mg/dL  -----------------------          Passed - K in normal range and within 180 days    Potassium  Date Value Ref Range Status  07/04/2022 4.5 3.5 - 5.2 mmol/L Final  07/25/2014 3.7 3.5 - 5.1 mmol/L Final         Passed - Ca in normal range and within 180 days    Calcium  Date Value Ref Range Status  07/04/2022 10.2 8.6 - 10.2 mg/dL Final   Calcium, Total  Date Value Ref Range Status  07/25/2014 8.9 8.5 - 10.1 mg/dL Final   Calcium, Ion  Date Value Ref Range Status  07/27/2010 1.08 (L) 1.12 - 1.32 mmol/L Final         Passed - Na in normal range and within 180 days    Sodium  Date Value Ref Range Status  07/04/2022 141 134 - 144 mmol/L Final  07/25/2014 139 136 - 145 mmol/L Final         Passed - Cl in normal range and within 180 days    Chloride  Date Value Ref Range Status  07/04/2022 99 96 - 106 mmol/L Final  07/25/2014 105 98 - 107 mmol/L Final         Passed - Last BP in normal range    BP Readings from Last 1 Encounters:  10/10/22 127/76         Passed - Valid encounter within last 6 months    Recent Outpatient Visits           1 month ago Type 2 diabetes mellitus with morbid obesity ([place])   [facility] Mecum, Erin E, PA-C   4 months ago Type 2 diabetes mellitus with morbid obesity ([place])   [facility] [place], Malachy Mood, NP   7 months ago Type 2 diabetes mellitus with morbid obesity ([place])   [facility] [name], [name], MD   11 months ago Hyperlipidemia associated with type 2 diabetes mellitus ([place])   [facility] [name], [name], MD   11 months ago Restless legs   [place], MD       Future Appointments             In 1 month Mecum, Dani Gobble, PA-C [facility]

## 2022-12-20 ENCOUNTER — Telehealth: Payer: Self-pay

## 2022-12-20 NOTE — Progress Notes (Cosign Needed)
Open in error

## 2022-12-20 NOTE — Progress Notes (Cosign Needed)
Reason for Encounter: Medication coordination and delivery  Contacted patient on 12/20/2022 to discuss medications    Medications: Outpatient Encounter Medications as of 12/20/2022  Medication Sig   ACCU-CHEK GUIDE test strip    albuterol (VENTOLIN HFA) 108 (90 Base) MCG/ACT inhaler Inhale 2 puffs into the lungs every 4 (four) hours as needed for wheezing or shortness of breath. Uses 2-3 times daily   atorvastatin (LIPITOR) 80 MG tablet TAKE 1 TABLET(80 MG) BY MOUTH DAILY   Budeson-Glycopyrrol-Formoterol (BREZTRI AEROSPHERE) 160-9-4.8 MCG/ACT AERO Inhale 2 puffs into the lungs 2 (two) times daily.   clotrimazole-betamethasone (LOTRISONE) cream Apply 1 Application topically 2 (two) times daily.   dapagliflozin propanediol (FARXIGA) 10 MG TABS tablet Take 1 tablet (10 mg total) by mouth daily.   diltiazem (CARDIZEM CD) 180 MG 24 hr capsule Take 1 capsule (180 mg total) by mouth daily.   ezetimibe (ZETIA) 10 MG tablet Take 1 tablet (10 mg total) by mouth daily.   furosemide (LASIX) 20 MG tablet TAKE ONE TABLET BY MOUTH EVERY MORNING and TAKE ONE TABLET BY MOUTH EVERYDAY AT BEDTIME   gabapentin (NEURONTIN) 600 MG tablet Take 2 tablets (1,200 mg total) by mouth at bedtime.   isosorbide mononitrate (IMDUR) 60 MG 24 hr tablet TAKE 1 TABLET(60 MG) BY MOUTH DAILY   metFORMIN (GLUCOPHAGE-XR) 500 MG 24 hr tablet Take 1 tablet (500 mg total) by mouth in the morning and at bedtime. 1 tab bid   metoprolol succinate (TOPROL-XL) 50 MG 24 hr tablet TAKE 1 TABLET BY MOUTH ONCE DAILY WITH OR IMMEDIATELY FOLLOWING A MEAL   nitroGLYCERIN (NITROSTAT) 0.4 MG SL tablet Place 1 tablet (0.4 mg total) under the tongue every 5 (five) minutes as needed for chest pain.   pantoprazole (PROTONIX) 40 MG tablet TAKE 1 TABLET(40 MG) BY MOUTH DAILY   ramipril (ALTACE) 10 MG capsule Take 1 capsule (10 mg total) by mouth daily.   rivaroxaban (XARELTO) 20 MG TABS tablet Take 1 tablet (20 mg total) by mouth daily with supper.   No  facility-administered encounter medications on file as of 12/20/2022.   BP Readings from Last 3 Encounters:  10/10/22 127/76  07/04/22 130/74  04/03/22 (!) 143/79    Pulse Readings from Last 3 Encounters:  10/10/22 91  07/04/22 76  04/03/22 67    Lab Results  Component Value Date/Time   HGBA1C 7.2 (H) 10/10/2022 11:50 AM   HGBA1C 7.6 (H) 07/04/2022 11:47 AM   HGBA1C 7.5 (H) 07/25/2014 04:25 AM   Lab Results  Component Value Date   CREATININE 1.39 (H) 07/04/2022   BUN 22 07/04/2022   GFRNONAA >60 04/04/2021   GFRAA 81 11/19/2020   NA 141 07/04/2022   K 4.5 07/04/2022   CALCIUM 10.2 07/04/2022   CO2 23 07/04/2022     Last adherence delivery date: 11/17/22    Patient is due for next adherence delivery on: 12/28/22  Spoke with patient on 12/20/22 reviewed medications and coordinated delivery.  This delivery to include: Adherence Packaging  30 Days    Patient declined the following medications this month:N/a  No refill request needed.  Confirmed delivery date of 12/28/22, advised patient that pharmacy will contact them the morning of delivery.   Any concerns about your medications? No  How often do you forget or accidentally miss a dose? Never  Do you use a pillbox? No  Is patient in packaging Yes  If yes  Any concerns or issues with your packaging? Np   Recent blood pressure  readings are as follows:10/10/22:127/76   Cycle dispensing form sent to [facility] for review.   Myriam Elta [place]

## 2022-12-25 ENCOUNTER — Other Ambulatory Visit: Payer: Self-pay | Admitting: Physician Assistant

## 2022-12-25 DIAGNOSIS — I482 Chronic atrial fibrillation, unspecified: Secondary | ICD-10-CM

## 2022-12-26 NOTE — Telephone Encounter (Signed)
Requested medication (s) are due for refill today:   Yes  Requested medication (s) are on the active medication list:   Yes  Future visit scheduled:   Yes 01/10/2023   Last ordered: 11/22/2022 #90, 0 refills  Returned because Cr. And Mag. due per protocol.      Requested Prescriptions  Pending Prescriptions Disp Refills   furosemide (LASIX) 20 MG tablet [Pharmacy Med Name: furosemide 20 mg tablet] 90 tablet 0    Sig: TAKE ONE TABLET BY MOUTH EVERY MORNING and TAKE ONE TABLET BY MOUTH EVERYDAY AT BEDTIME     Cardiovascular:  Diuretics - Loop Failed - 12/25/2022  8:21 AM      Failed - Cr in normal range and within 180 days    Creatinine  Date Value Ref Range Status  07/25/2014 1.21 0.60 - 1.30 mg/dL Final   Creatinine, Ser  Date Value Ref Range Status  07/04/2022 1.39 (H) 0.76 - 1.27 mg/dL Final         Failed - Mg Level in normal range and within 180 days    Magnesium  Date Value Ref Range Status  07/25/2014 1.6 (L) mg/dL Final    Comment:    1.8-2.4 THERAPEUTIC RANGE: 4-7 mg/dL TOXIC: > 10 mg/dL  -----------------------          Passed - K in normal range and within 180 days    Potassium  Date Value Ref Range Status  07/04/2022 4.5 3.5 - 5.2 mmol/L Final  07/25/2014 3.7 3.5 - 5.1 mmol/L Final         Passed - Ca in normal range and within 180 days    Calcium  Date Value Ref Range Status  07/04/2022 10.2 8.6 - 10.2 mg/dL Final   Calcium, Total  Date Value Ref Range Status  07/25/2014 8.9 8.5 - 10.1 mg/dL Final   Calcium, Ion  Date Value Ref Range Status  07/27/2010 1.08 (L) 1.12 - 1.32 mmol/L Final         Passed - Na in normal range and within 180 days    Sodium  Date Value Ref Range Status  07/04/2022 141 134 - 144 mmol/L Final  07/25/2014 139 136 - 145 mmol/L Final         Passed - Cl in normal range and within 180 days    Chloride  Date Value Ref Range Status  07/04/2022 99 96 - 106 mmol/L Final  07/25/2014 105 98 - 107 mmol/L Final          Passed - Last BP in normal range    BP Readings from Last 1 Encounters:  10/10/22 127/76         Passed - Valid encounter within last 6 months    Recent Outpatient Visits           2 months ago Type 2 diabetes mellitus with morbid obesity ([place])   [facility] Mecum, Erin E, PA-C   5 months ago Type 2 diabetes mellitus with morbid obesity ([place])   [facility] [place], Malachy Mood, NP   8 months ago Type 2 diabetes mellitus with morbid obesity ([place])   [facility] [name], [name], MD   12 months ago Hyperlipidemia associated with type 2 diabetes mellitus ([place])   [facility] [name], [name], MD   1 year ago Restless legs   [place] [name], [name], MD       Future Appointments  In 2 weeks Cannady, Barbaraann Faster, NP [facility]

## 2023-01-07 NOTE — Patient Instructions (Incomplete)

## 2023-01-08 ENCOUNTER — Ambulatory Visit: Payer: Medicare HMO

## 2023-01-08 NOTE — Patient Outreach (Signed)
Care Management & Coordination Services Pharmacy Note  01/08/2023 Name:  Ernest [name] MRN:  [id number] DOB:  [birth year]  Summary: -Pleasant 72 year old male presents for f/u CCM visit. He has a garden full of corn, beans, tomatoes, squash, and okra. He used to work for [employer] for 28 years. Currently, he loves to fish and camp. He also tinkers on his cars, specifically his [place]  Recommendations/Changes made from today's visit: -Will have tracy check on PAP status for Breztri/Farxiga for 2024 -Metoprolol was sent to [facility] and patient hasn't taken in months. Forwarded high priority msg to PCP to determine if she'd like him back on it or not   Subjective: Ernest [name] is an 72 y.o. year old male who is a primary patient of [name], [name], MD (Inactive).  The care coordination team was consulted for assistance with disease management and care coordination needs.    Engaged with patient by telephone for follow up visit.   Objective:  Lab Results  Component Value Date   CREATININE 1.39 (H) 07/04/2022   BUN 22 07/04/2022   EGFR 54 (L) 07/04/2022   GFRNONAA >60 04/04/2021   GFRAA 81 11/19/2020   NA 141 07/04/2022   K 4.5 07/04/2022   CALCIUM 10.2 07/04/2022   CO2 23 07/04/2022   GLUCOSE 147 (H) 07/04/2022    Lab Results  Component Value Date/Time   HGBA1C 7.2 (H) 10/10/2022 11:50 AM   HGBA1C 7.6 (H) 07/04/2022 11:47 AM   HGBA1C 7.5 (H) 07/25/2014 04:25 AM   MICROALBUR 80 (H) 12/05/2021 08:34 AM   MICROALBUR 80 (H) 05/31/2021 02:16 PM    Last diabetic Eye exam:  Lab Results  Component Value Date/Time   HMDIABEYEEXA No Retinopathy 06/14/2022 12:00 AM    Last diabetic Foot exam: No results found for: "HMDIABFOOTEX"   Lab Results  Component Value Date   CHOL 87 (L) 07/04/2022   HDL 26 (L) 07/04/2022   LDLCALC 29 07/04/2022   TRIG 200 (H) 07/04/2022   CHOLHDL 2.6 04/03/2022       Latest Ref Rng & Units 07/04/2022   11:49 AM  12/05/2021    8:38 AM 08/30/2021   10:11 AM  Hepatic Function  Total Protein 6.0 - 8.5 g/dL 7.1  6.8  6.9   Albumin 3.8 - 4.8 g/dL 4.7  4.5  4.6   AST 0 - 40 IU/L 40  19  22   ALT 0 - 44 IU/L 39  15  17   Alk Phosphatase 44 - 121 IU/L 110  90  92   Total Bilirubin 0.0 - 1.2 mg/dL 2.5  1.4  1.2     Lab Results  Component Value Date/Time   TSH 2.960 12/05/2021 08:38 AM   TSH 2.290 05/31/2021 02:17 PM   FREET4 1.36 12/20/2020 11:32 AM       Latest Ref Rng & Units 11/14/2021    1:06 PM 08/30/2021   10:11 AM 08/09/2021    2:15 PM  CBC  WBC 4.0 - 10.5 K/uL 7.4  6.6  6.5   Hemoglobin 13.0 - 17.0 g/dL 13.5  13.4  12.0   Hematocrit 39.0 - 52.0 % 41.6  42.2  37.6   Platelets 150 - 400 K/uL 170  171  172     Lab Results  Component Value Date/Time   VITAMINB12 360 04/03/2022 11:06 AM   VITAMINB12 268 11/14/2021 01:06 PM    Clinical ASCVD: No  The ASCVD Risk score (Arnett  DK, et al., 2019) failed to calculate for the following reasons:   The valid total cholesterol range is 130 to 320 mg/dL    Other: (CHADS2VASc if Afib, MMRC or CAT for COPD, ACT, DEXA)     04/03/2022   10:46 AM 02/15/2022   10:26 AM 12/13/2021   10:32 AM  Depression screen PHQ 2/9  Decreased Interest 0 0 0  Down, Depressed, Hopeless 0 0 0  PHQ - 2 Score 0 0 0  Altered sleeping 0  0  Tired, decreased energy 0  0  Change in appetite 0  0  Feeling bad or failure about yourself  0  0  Trouble concentrating 0  0  Moving slowly or fidgety/restless 0  0  Suicidal thoughts 0  0  PHQ-9 Score 0  0  Difficult doing work/chores Not difficult at all  Not difficult at all     Social History   Tobacco Use  Smoking Status Former   Packs/day: 4.00   Years: 28.00   Total pack years: 112.00   Types: Cigarettes   Quit date: 08/25/1993   Years since quitting: 29.3  Smokeless Tobacco Never   BP Readings from Last 3 Encounters:  10/10/22 127/76  07/04/22 130/74  04/03/22 (!) 143/79   Pulse Readings from Last 3  Encounters:  10/10/22 91  07/04/22 76  04/03/22 67   Wt Readings from Last 3 Encounters:  10/10/22 247 lb (112 kg)  07/04/22 238 lb 12.8 oz (108.3 kg)  04/03/22 238 lb (108 kg)   BMI Readings from Last 3 Encounters:  10/10/22 34.58 kg/m  07/04/22 33.43 kg/m  04/03/22 33.32 kg/m    Allergies  Allergen Reactions   Doxycycline Other (See Comments)    Mouth sores     Medications Reviewed Today     Reviewed by Lane Hacker, [facility] (Pharmacist) on 01/08/23 at [place] List Status: <None>   Medication Order Taking? Sig Documenting Provider Last Dose Status Informant  ACCU-CHEK GUIDE test strip [id number]   [provider]  Active   albuterol (VENTOLIN HFA) 108 (90 Base) MCG/ACT inhaler [id number]  Inhale 2 puffs into the lungs every 4 (four) hours as needed for wheezing or shortness of breath. Uses 2-3 times daily Cannady, Henrine Screws T, NP  Active   atorvastatin (LIPITOR) 80 MG tablet [id number]  TAKE 1 TABLET(80 MG) BY MOUTH DAILY Cannady, Jolene T, NP  Active   Budeson-Glycopyrrol-Formoterol (BREZTRI AEROSPHERE) 160-9-4.8 MCG/ACT AERO [id number]  Inhale 2 puffs into the lungs 2 (two) times daily. Marnee Guarneri T, NP  Active   clotrimazole-betamethasone (LOTRISONE) cream [id number]  Apply 1 Application topically 2 (two) times daily. Kathrine Haddock, NP  Active   dapagliflozin propanediol (FARXIGA) 10 MG TABS tablet [id number]  Take 1 tablet (10 mg total) by mouth daily. Marnee Guarneri T, NP  Active   diltiazem (CARDIZEM CD) 180 MG 24 hr capsule [id number]  Take 1 capsule (180 mg total) by mouth daily. Marnee Guarneri T, NP  Active   ezetimibe (ZETIA) 10 MG tablet [id number]  Take 1 tablet (10 mg total) by mouth daily. Marnee Guarneri T, NP  Active   furosemide (LASIX) 20 MG tablet [id number]  TAKE ONE TABLET BY MOUTH EVERY MORNING and TAKE ONE TABLET BY MOUTH EVERYDAY AT BEDTIME Mecum, Erin E, PA-C  Active   gabapentin (NEURONTIN) 600 MG tablet [id number]  Take 2 tablets (1,200 mg  total) by mouth at bedtime. Marnee Guarneri T, NP  Active   isosorbide mononitrate (IMDUR)  60 MG 24 hr tablet [id number]  TAKE 1 TABLET(60 MG) BY MOUTH DAILY Cannady, Jolene T, NP  Active   metFORMIN (GLUCOPHAGE-XR) 500 MG 24 hr tablet [id number]  Take 1 tablet (500 mg total) by mouth in the morning and at bedtime. 1 tab bid Marnee Guarneri T, NP  Active   metoprolol succinate (TOPROL-XL) 50 MG 24 hr tablet [id number]  TAKE 1 TABLET BY MOUTH ONCE DAILY WITH OR IMMEDIATELY FOLLOWING A MEAL Dunn, Ryan M, PA-C  Active   nitroGLYCERIN (NITROSTAT) 0.4 MG SL tablet [id number]  Place 1 tablet (0.4 mg total) under the tongue every 5 (five) minutes as needed for chest pain. Furth, Cadence H, PA-C  Active   pantoprazole (PROTONIX) 40 MG tablet [id number]  TAKE 1 TABLET(40 MG) BY MOUTH DAILY Cannady, Jolene T, NP  Active   ramipril (ALTACE) 10 MG capsule [id number]  Take 1 capsule (10 mg total) by mouth daily. Mecum, Erin E, PA-C  Active   rivaroxaban (XARELTO) 20 MG TABS tablet [id number]  Take 1 tablet (20 mg total) by mouth daily with supper. Marnee Guarneri T, NP  Expired 11/28/22 2359             SDOH:  (Social Determinants of Health) assessments and interventions performed: Yes SDOH Interventions    Flowsheet Row Care Coordination from 01/08/2023 in [place] Management from 07/17/2022 in [place] Office Visit from 04/03/2022 in [place] Support from 02/15/2022 in [place] Management from 04/04/2021 in [place] Management from 04/02/2020 in [place] Interventions        Food Insecurity Interventions -- -- -- Intervention Not Indicated -- --  Housing Interventions -- -- -- Intervention Not Indicated -- --  Transportation Interventions Intervention Not Indicated Intervention Not Indicated -- Intervention Not  Indicated -- --  Depression Interventions/Treatment  -- -- PHQ2-9 Score <4 Follow-up Not Indicated -- -- --  Financial Strain Interventions Other (Comment)  [PAP] Other (Comment)  [PAP] -- [id number] Referral Other (Comment)  [patient assistanc] Other (Comment)  [works 3 days a week, fear after not working can not pay for needs]  Physical Activity Interventions -- -- -- Intervention Not Indicated -- Other (Comments)  [no structured activity but stays active with his job]  Stress Interventions -- -- -- Intervention Not Indicated -- --  Social Connections Interventions -- -- -- Intervention Not Indicated -- Other (Comment)  [good support system.]      [place]: Food Insecurity Present (02/16/2022)  Housing: Low Risk  (02/15/2022)  Transportation Needs: No Transportation Needs (01/08/2023)  Alcohol Screen: Low Risk  (02/15/2022)  Depression (PHQ2-9): Low Risk  (04/03/2022)  Financial Resource Strain: High Risk (01/08/2023)  Physical Activity: Inactive (02/15/2022)  Social Connections: Socially Isolated (02/15/2022)  Stress: Stress Concern Present (02/15/2022)  Tobacco Use: Medium Risk (10/10/2022)    Medication Assistance:   Wilder Glade: -2023: Approved -2024: Waiting on status  Breztri: -2023: Approved -2024: Waiting on status   Name and location of Current pharmacy:  [facility] [address], [name] - [place] [address] [name] [address] Phone: [phone] Fax: [phone]  [facility] - [place], [name] - [address] [name] [address] Phone: [phone] Fax: [phone]   Assessment/Plan   HypertensionCKD  (BP goal <130/80) BP Readings from  Last 3 Encounters:  10/10/22 127/76  07/04/22 130/74  04/03/22 (!) 143/79  -Controlled -Current treatment: Diltiazem er 180 mg qd Appropriate, Effective, Safe, Accessible Ramipril 10 mg qd Appropriate, Effective, Safe,  Accessible Furosemide 20 mg bid Appropriate, Effective, Safe, Accessible Isosorbide Mononitrate er 60 mg qd Appropriate, Effective, Safe, Accessible Metoprolol Succ '50mg'$  QD Query Appropriate,  -Medications previously tried: Carvedilol -Current home readings: 130-150/85 -Current dietary habits: Eats out most days, [place] and [place]  -Current exercise habits: No structured regimen but stays busy around his house -Denies hypotensive/hypertensive symptoms -Educated on BP goals and benefits of medications for prevention of heart attack, stroke and kidney damage; Daily salt intake goal < 2300 mg; Exercise goal of 150 minutes per week; Proper BP monitoring technique; Symptoms of hypotension and importance of maintaining adequate hydration; -Counseled to monitor BP at home daily, document, and provide log at future appointments -Counseled on diet and exercise extensively July 2023: Patient non-compliant on meds. Priority is to improve compliance and then can further assess (Though his BP has been at goal in the past) Jan 2024: Patient hasn't taken Metoprolol in months. Was sent to [facility], not [facility], and he never picked up. Sent high priority msg to PCP to see if she wants him to start again (Considering is also on Dilt)     Hyperlipidemia/ CAD with angina: (LDL goal <70) The ASCVD Risk score (Arnett DK, et al., 2019) failed to calculate for the following reasons:   The valid total cholesterol range is 130 to 320 mg/dL Lab Results  Component Value Date   CHOL 87 (L) 07/04/2022   CHOL 80 (L) 04/03/2022   CHOL 73 (L) 12/05/2021   Lab Results  Component Value Date   HDL 26 (L) 07/04/2022   HDL 31 (L) 04/03/2022   HDL 24 (L) 12/05/2021   Lab Results  Component Value Date   LDLCALC 29 07/04/2022   LDLCALC 27 04/03/2022   LDLCALC 22 12/05/2021   Lab Results  Component Value Date   TRIG 200 (H) 07/04/2022   TRIG 120 04/03/2022   TRIG 158 (H) 12/05/2021   Lab Results   Component Value Date   CHOLHDL 2.6 04/03/2022   CHOLHDL 3.0 12/05/2021   CHOLHDL 5.5 (H) 08/30/2021   No results found for: "LDLDIRECT" Last vitamin D No results found for: "25OHVITD2", "25OHVITD3", "VD25OH" Lab Results  Component Value Date   TSH 2.960 12/05/2021   -Controlled -Current treatment: Atorvastatin 80 mg qd Appropriate, Effective, Safe, Accessible SL NTG 0.4 mg prn Appropriate, Effective, Safe, Accessible -Medications previously tried: NA --Educated on Cholesterol goals;  Benefits of statin for ASCVD risk reduction; Exercise goal of 150 minutes per week; -Counseled on diet and exercise extensively July 2023: Patient non-compliant on meds. Priority is to improve compliance and then can further assess Jan 2024: Compliance has significantly improved     Diabetes (A1c goal <7%) Lab Results  Component Value Date   HGBA1C 7.2 (H) 10/10/2022   HGBA1C 7.6 (H) 07/04/2022   HGBA1C 6.9 (H) 04/03/2022   Lab Results  Component Value Date   MICROALBUR 80 (H) 12/05/2021   LDLCALC 29 07/04/2022   CREATININE 1.39 (H) 07/04/2022    Lab Results  Component Value Date   NA 141 07/04/2022   K 4.5 07/04/2022   CREATININE 1.39 (H) 07/04/2022   EGFR 54 (L) 07/04/2022   GFRNONAA >60 04/04/2021   GLUCOSE 147 (H) 07/04/2022    Lab Results  Component Value Date   WBC 7.4 11/14/2021  HGB 13.5 11/14/2021   HCT 41.6 11/14/2021   MCV 83.9 11/14/2021   PLT 170 11/14/2021    Lab Results  Component Value Date   LABMICR See below: 10/20/2019   LABMICR See below: 02/01/2018   MICROALBUR 80 (H) 12/05/2021   MICROALBUR 80 (H) 05/31/2021  -Controlled -Current medications: Metformin 1000 xr bid Appropriate, Effective, Safe, Accessible farxiga 5 mg qd Appropriate, Effective, Safe, Accessible -Medications previously tried: glipizide 5 - hypoglycemia -Current home glucose readings fasting glucose:  July 2023: Patient didn't have readings on him Jan 2024: Patient hasn't  tested in over a month -Denies hypoglycemic/hyperglycemic symptoms --Educated on A1c and blood sugar goals; Complications of diabetes including kidney damage, retinal damage, and cardiovascular disease; Exercise goal of 150 minutes per week; Benefits of routine self-monitoring of blood sugar; Carbohydrate counting and/or plate method -Counseled to check feet daily and get yearly eye exams -Counseled on diet and exercise extensively July 2023: PAP for Wilder Glade (Then will increase dose) Jan 2024: Patient hasn't tested in over a month. Counseled patient to start testing when he takes his medications. -Will check on PAP status of Farxiga   Atrial Fibrillation (Goal: prevent stroke and major bleeding) -Controlled -CHADSVASC: 5 -Current treatment: Rate control:  Diltiazem er 180 mg qd Appropriate, Effective, Safe, Accessible Ramipril 10 mg qd Appropriate, Effective, Safe, Accessible Furosemide 20 mg bid Appropriate, Effective, Safe, Accessible Isosorbide Mononitrate er 60 mg qd  Appropriate, Effective, Safe, Accessible Metoprolol Succ '50mg'$  QD Query Appropriate,  Anticoagulation:  Xarelto 20 mg qd Appropriate, Effective, Safe, Accessible -Medications previously tried: Toprol -Home BP and HR readings: 130-150s/80s  -Counseled on increased risk of stroke due to Afib and benefits of anticoagulation for stroke prevention; importance of adherence to anticoagulant exactly as prescribed; avoidance of NSAIDs due to increased bleeding risk with anticoagulants; importance of regular laboratory monitoring; seeking medical attention after a head injury or if there is blood in the urine/stool; -Counseled on diet and exercise extensively Recommended to continue current medication Jan 2024: Denied Xarelto PAP   COPD (Goal: control symptoms and prevent exacerbations) -Not ideally controlled -Current treatment  Albuterol Appropriate, Effective, Safe, Accessible Breztri Appropriate, Effective, Safe,  Accessible 2023: PAP Approved 2024: awaiting status as of Jan 2024 -Medications previously tried: NA  -Gold Grade: Gold 1 (FEV1>80%) -Current COPD Classification:  B (high sx, <2 exacerbations/yr) -Pulmonary function testing:  Pulmonary Functions Testing Results:  No results found for: "FEV1", "FVC", "FEV1FVC", "TLC", "DLCO" -Exacerbations requiring treatment in last 6 months: none -Patient reports consistent use of maintenance inhaler -Frequency of rescue inhaler use: several times weekly, More SOB lately-likely multifactoral  -Counseled on Proper inhaler technique; Benefits of consistent maintenance inhaler use When to use rescue inhaler -Counseled on diet and exercise extensively July 2023: PAP symbicort Jan 2024; Will check on Breztri status  CPP F/U PRN  [place] Constable, Pharm.D. - [phone]

## 2023-01-08 NOTE — Progress Notes (Signed)
Cardiology Office Note    Date:  01/09/2023   ID:  Ernest [name], DOB 1951/08/03, MRN [id number]  PCP:  [name], MD (Inactive)  Cardiologist:  Ida Rogue, MD  Electrophysiologist:  None   Chief Complaint: Follow-up  History of Present Illness:   Ernest [name] is a 72 y.o. male with history of CAD status post PCI/DES to the LAD in 02/2019, permanent A. fib, DM2, HTN, HLD, iron deficiency anemia, and OSA who presents for follow-up of CAD and A-fib.   He was admitted to the hospital in 02/2019 with an NSTEMI.  LHC showed severe single-vessel CAD with 80% mid LAD stenosis which was successfully treated with PCI/DES.  There was nonobstructive disease involving the left main, LCx, and RCA.  Echo at that time demonstrated an EF of 55 to 60%, moderately increased LV wall thickness, indeterminate LV diastolic function parameters, normal RV systolic function and ventricular cavity size, mildly to moderately dilated left atrium, and mild mitral annular calcification.  Most recent echo from 04/2021 demonstrated an EF of 60 to 65%, no regional wall motion abnormalities, mildly dilated internal LV cavity size, indeterminate LV diastolic function parameters, normal RV systolic function with mildly enlarged RV cavity size, elevated PASP, moderately dilated right atrium, and mild mitral regurgitation.  He was seen by his primary cardiologist in 10/2021 and was having to take nitro occasionally with concerns of chest pain.  Given this, he underwent Lexiscan MPI in 11/2021 which demonstrated no significant ischemia or scar and was overall low risk.  He was last seen in our office in 12/2021 to discuss cost of anticoagulation.  He had previously been approved for reduced co-pay.  He was without symptoms of angina or decompensation.  He had been without anticoagulation since the end of 10/2021, as he reported his medication was not shipped to him.  It was also noted he had both carvedilol and metoprolol  on his medication list.  At the time of his visit he was unclear if he was taking both of these medications.  He later called back to our office to inform us he was not taking carvedilol, though was taking metoprolol.  We contacted the pharmaceutical company, and they told us he did have a prescription on file and it was unclear why this was never sent to him.  He was provided samples of Xarelto with recommendation to continue [place] indefinitely and contact our office if cost was going to be an issue.  He comes in doing well for a cardiac perspective and is without symptoms of angina or decompensation.  No palpitations, dyspnea, presyncope, or syncope.  He does note some mild positional dizziness if he stands up quickly that resolves after 1 or 2 seconds.  He indicates he does not drink much water at all.  He remains adherent to cardiac medications including atorvastatin, diltiazem, furosemide, Imdur, metoprolol, and rivaroxaban.  He did recently fall out of the bed while sleeping, though did not hit his head or suffer LOC.  No symptoms concerning for bleeding.  He remains active at baseline without cardiac limitation.  Overall, he feels like he is doing well from a cardiac perspective and does not have any acute concerns at this time.  He has follow-up with his new PCP tomorrow.   Labs independently reviewed: 09/2022 - A1c 7.2 06/2022 - TC 87, TG 200, HDL 26, LDL 29, BUN 22, serum creatinine 1.39, potassium 4.5, albumin 4.7, AST/ALT normal 11/2021 - TSH normal 10/2021 - Hgb  13.5, PLT 170  Past Medical History:  Diagnosis Date   A-fib ([place])    Anemia    Asthma    COPD (chronic obstructive pulmonary disease) (HCC)    Diabetes mellitus    Type II   Early Pulmonary fibrosis (HCC)    GERD (gastroesophageal reflux disease)    History of echocardiogram    a. 02/2017 Echo: EF 60-65%, no rwma, mild MR, mod dil LA. Nl RV fxn. PASP 43mHg.   Hyperlipidemia    Hypertension    Morbid obesity ([facility]     Myocardial infarction ([facility]    "light"   Non-obstructive CAD (coronary artery disease)    a. 2011 Cath: nonobs dzs; b. 07/2014 Cath: LM 30, LAD nl, LCX nl, RCA 20p, 348m  Permanent atrial fibrillation (HCC)    a. CHA2DS2VASc = 4-->xarelto.   Polyp of sigmoid colon     Past Surgical History:  Procedure Laterality Date   ANKLE SURGERY     [facility]  [facility]   [facility]     [facility]  [facility]   [facility] [facility]     COLONOSCOPY WITH PROPOFOL N/A 06/26/2018   Procedure: COLONOSCOPY WITH PROPOFOL;  Surgeon: TaVirgel ManifoldMD;  Location: [facility] ENDOSCOPY;  Service: Endoscopy;  Laterality: N/A;   COLONOSCOPY WITH PROPOFOL N/A 05/23/2021   Procedure: COLONOSCOPY WITH PROPOFOL;  Surgeon: TaVirgel ManifoldMD;  Location: [facility] ENDOSCOPY;  Service: Endoscopy;  Laterality: N/A;   CORONARY ARTERY BYPASS GRAFT     CORONARY STENT INTERVENTION N/A 03/07/2019   Procedure: CORONARY STENT INTERVENTION;  Surgeon: EnNelva BushMD;  [facility];  Service: Cardiovascular;  Laterality: N/A;   ENTEROSCOPY N/A 08/05/2018   Procedure: ENTEROSCOPY;  Surgeon: TaVirgel ManifoldMD;  Location: [facility] ENDOSCOPY;  Service: Endoscopy;  Laterality: N/A;   ESOPHAGOGASTRODUODENOSCOPY (EGD) WITH PROPOFOL N/A 06/26/2018   Procedure: ESOPHAGOGASTRODUODENOSCOPY (EGD) WITH PROPOFOL;  Surgeon: TaVirgel ManifoldMD;  Location: [facility] ENDOSCOPY;  Service: Endoscopy;  Laterality: N/A;   ESOPHAGOGASTRODUODENOSCOPY (EGD) WITH PROPOFOL N/A 05/23/2021   Procedure: PUSH ESOPHAGOGASTRODUODENOSCOPY (EGD) WITH PROPOFOL;  Surgeon: TaVirgel ManifoldMD;  Location: [facility] ENDOSCOPY;  Service: Endoscopy;  Laterality: N/A;   LEFT HEART CATH AND CORONARY ANGIOGRAPHY N/A 03/07/2019   Procedure: LEFT HEART CATH AND CORONARY ANGIOGRAPHY;  Surgeon: EnNelva BushMD;  [facility];  Service: Cardiovascular;   Laterality: N/A;   TESTICLE SURGERY  70's   TUMOR EXCISION     Neck and finger; benign   WRIST SURGERY      Current Medications: Current Meds  Medication Sig   ACCU-CHEK GUIDE test strip    albuterol (VENTOLIN HFA) 108 (90 Base) MCG/ACT inhaler Inhale 2 puffs into the lungs every 4 (four) hours as needed for wheezing or shortness of breath. Uses 2-3 times daily   atorvastatin (LIPITOR) 80 MG tablet TAKE 1 TABLET(80 MG) BY MOUTH DAILY   Budeson-Glycopyrrol-Formoterol (BREZTRI AEROSPHERE) 160-9-4.8 MCG/ACT AERO Inhale 2 puffs into the lungs 2 (two) times daily.   clotrimazole-betamethasone (LOTRISONE) cream Apply 1 Application topically 2 (two) times daily.   dapagliflozin propanediol (FARXIGA) 10 MG TABS tablet Take 1 tablet (10 mg total) by mouth daily.   diltiazem (CARDIZEM CD) 180 MG 24 hr capsule Take 1 capsule (180 mg total) by mouth daily.   ezetimibe (ZETIA) 10 MG tablet Take 1 tablet (10 mg total) by mouth daily.  furosemide (LASIX) 20 MG tablet TAKE ONE TABLET BY MOUTH EVERY MORNING and TAKE ONE TABLET BY MOUTH EVERYDAY AT BEDTIME   gabapentin (NEURONTIN) 600 MG tablet Take 2 tablets (1,200 mg total) by mouth at bedtime.   isosorbide mononitrate (IMDUR) 60 MG 24 hr tablet TAKE 1 TABLET(60 MG) BY MOUTH DAILY   metFORMIN (GLUCOPHAGE-XR) 500 MG 24 hr tablet Take 1 tablet (500 mg total) by mouth in the morning and at bedtime. 1 tab bid   metoprolol succinate (TOPROL-XL) 50 MG 24 hr tablet TAKE 1 TABLET BY MOUTH ONCE DAILY WITH OR IMMEDIATELY FOLLOWING A MEAL   nitroGLYCERIN (NITROSTAT) 0.4 MG SL tablet Place 1 tablet (0.4 mg total) under the tongue every 5 (five) minutes as needed for chest pain.   pantoprazole (PROTONIX) 40 MG tablet TAKE 1 TABLET(40 MG) BY MOUTH DAILY   ramipril (ALTACE) 10 MG capsule Take 1 capsule (10 mg total) by mouth daily.   rivaroxaban (XARELTO) 20 MG TABS tablet Take 1 tablet (20 mg total) by mouth daily with supper.    Allergies:   Doxycycline   Social  History   Socioeconomic History   Marital status: Widowed    Spouse name: Not on file   Number of children: Not on file   Years of education: Not on file   Highest education level: 8th grade  Occupational History   Occupation: part time   Tobacco Use   Smoking status: Former    Packs/day: 4.00    Years: 28.00    Total pack years: 112.00    Types: Cigarettes    Quit date: 08/25/1993    Years since quitting: 29.3   Smokeless tobacco: Never  Vaping Use   Vaping Use: Never used  Substance and Sexual Activity   Alcohol use: Not Currently   Drug use: No   Sexual activity: Not Currently  Other Topics Concern   Not on file  Social History Narrative   No regular exercise.   Social Determinants of Health   Financial Resource Strain: High Risk (01/08/2023)   Overall Financial Resource Strain (CARDIA)    Difficulty of Paying Living Expenses: Very hard  Food Insecurity: Food Insecurity Present (02/16/2022)   Hunger Vital Sign    Worried About Running Out of Food in the Last Year: Often true    Ran Out of Food in the Last Year: Often true  Transportation Needs: No Transportation Needs (01/08/2023)   PRAPARE - Hydrologist (Medical): No    Lack of Transportation (Non-Medical): No  Physical Activity: Inactive (02/15/2022)   Exercise Vital Sign    Days of Exercise per Week: 0 days    Minutes of Exercise per Session: 0 min  Stress: Stress Concern Present (02/15/2022)   [place]    Feeling of Stress : To some extent  Social Connections: Socially Isolated (02/15/2022)   Social Connection and Isolation Panel [NHANES]    Frequency of Communication with Friends and Family: Twice a week    Frequency of Social Gatherings with Friends and Family: Twice a week    Attends Religious Services: Never    Marine scientist or Organizations: No    Attends Archivist Meetings: Never    Marital  Status: Widowed     Family History:  The patient's family history includes Alcohol abuse in an other family member; Cancer in his brother; Coronary artery disease in an other family member; Depression in  an other family member; Diabetes in his mother and another family member; Heart disease in his mother; Hyperlipidemia in his mother and another family member; Hypertension in his mother and another family member; Kidney Stones in his brother; Pneumonia in his father.  ROS:   12-point review of systems is negative unless otherwise noted in HPI.   EKGs/Labs/Other Studies Reviewed:    Studies reviewed were summarized above. The additional studies were reviewed today:  Lexiscan MPI 11/2021:   Normal pharmacologic myocardial perfusion stress test without evidence of significant ischemia or scar.   Normal left ventricular systolic function (LVEF > 65%).   Attenuation correction CT is notable for coronary artery calcification and/or coronary stent(s), aortic atherosclerosis, pericardial calcification, and calcified left hilar lymph node.   This is a low risk study __________   2D echo 04/2021: 1. Left ventricular ejection fraction, by estimation, is 60 to 65%. The  left ventricle has normal function. The left ventricle has no regional  wall motion abnormalities. The left ventricular internal cavity size was  mildly dilated. Left ventricular  diastolic parameters are indeterminate.   2. Right ventricular systolic function is normal. The right ventricular  size is mildly enlarged. There is moderately elevated pulmonary artery  systolic pressure. The estimated right ventricular systolic pressure is  93.8 mmHg.   3. Right atrial size was moderately dilated.   4. The mitral valve is normal in structure. Mild mitral valve  regurgitation. __________   2D echo 02/2019: 1. The left ventricle has normal systolic function, with an ejection  fraction of 55-60%. The cavity size was normal. There is  moderately  increased left ventricular wall thickness. Left ventricular diastolic  function could not be evaluated secondary to  atrial fibrillation.   2. The right ventricle has normal systolic function. The cavity was  normal. There is no increase in right ventricular wall thickness.   3. Left atrial size was mild-moderately dilated.   4. The aortic valve was not well visualized Mild thickening of the aortic  valve. Mild to moderate aortic annular calcification noted.   5. The mitral valve was not well visualized. There is mild mitral annular  calcification present.   6. The interatrial septum was not well visualized. __________   LHC 02/2019: Conclusions: Severe single-vessel CAD with 80% mid LAD stenosis. Non-obstructive LMCA, LCx, and RCA disease. Upper normal left ventricular filling pressure with normal ejection fraction. Mild to moderate mitral regurgitation in the setting of catheter-induced non-sustained ventricular tachycardia. Successful PCI to mid LAD using Synergy 2.5 x 24 mm drug-eluting stent (post-dilated to 2.8 mm) with 0% residual stenosis and TIMI-3 flow.   Recommendations: Overnight monitoring. Obtain echo to better evaluate mitral regurgitation noted on left ventriculogram. Restart heparin infusion 2 hours after TR band removal. If no evidence of bleeding/vascular injury, recommend restarting rivaroxaban tomorrow and continuing rivaroxaban 20 mg daily and clopidogrel 75 mg daily x 12 months. Aggressive secondary prevention. __________   2D echo 02/2017: - Left ventricle: The cavity size was normal. Systolic function was    normal. The estimated ejection fraction was in the range of 60%    to 65%. Wall motion was normal; there were no regional wall    motion abnormalities. The study is not technically sufficient to    allow evaluation of LV diastolic function.  - Mitral valve: There was mild regurgitation.  - Left atrium: The atrium was moderately dilated.  -  Right ventricle: Systolic function was normal.  - Pulmonary arteries: Systolic pressure  was mildly elevated. PA    peak pressure: 43 mm Hg (S).   Impressions:   - Rhythm is atrial fibrillation.   EKG:  EKG is ordered today.  The EKG ordered today demonstrates A-fib, 76 bpm, nonspecific ST-T changes  Recent Labs: 07/04/2022: ALT 39; BUN 22; Creatinine, Ser 1.39; Potassium 4.5; Sodium 141  Recent Lipid Panel    Component Value Date/Time   CHOL 87 (L) 07/04/2022 1149   CHOL 195 08/01/2017 0902   CHOL 104 07/25/2014 0425   TRIG 200 (H) 07/04/2022 1149   TRIG 268 (H) 08/01/2017 0902   TRIG 243 (H) 07/25/2014 0425   TRIG 249 05/27/2010 0000   HDL 26 (L) 07/04/2022 1149   HDL 25 (L) 07/25/2014 0425   CHOLHDL 2.6 04/03/2022 1106   VLDL 54 (H) 08/01/2017 0902   VLDL 49 (H) 07/25/2014 0425   LDLCALC 29 07/04/2022 1149   LDLCALC 30 07/25/2014 0425    PHYSICAL EXAM:    VS:  BP 120/60 (BP Location: Left Arm, Patient Position: Sitting, Cuff Size: Large)   Pulse 77   Ht '5\' 10"'$  (1.778 m)   Wt 255 lb 4 oz (115.8 kg)   SpO2 99%   BMI 36.62 kg/m   BMI: Body mass index is 36.62 kg/m.  Physical Exam Vitals reviewed.  Constitutional:      Appearance: He is well-developed.  HENT:     Head: Normocephalic and atraumatic.  Eyes:     General:        Right eye: No discharge.        Left eye: No discharge.  Neck:     Vascular: No JVD.  Cardiovascular:     Rate and Rhythm: Normal rate. Rhythm irregularly irregular.     Heart sounds: Normal heart sounds, S1 normal and S2 normal. Heart sounds not distant. No midsystolic click and no opening snap. No murmur heard.    No friction rub.  Pulmonary:     Effort: Pulmonary effort is normal. No respiratory distress.     Breath sounds: Normal breath sounds. No decreased breath sounds, wheezing or rales.  Chest:     Chest wall: No tenderness.  Abdominal:     General: There is no distension.  Musculoskeletal:     Cervical back: Normal range of  motion.     Right lower leg: No edema.     Left lower leg: No edema.  Skin:    General: Skin is warm and dry.     Nails: There is no clubbing.  Neurological:     Mental Status: He is alert and oriented to person, place, and time.  Psychiatric:        Speech: Speech normal.        Behavior: Behavior normal.        Thought Content: Thought content normal.        Judgment: Judgment normal.     Wt Readings from Last 3 Encounters:  01/09/23 255 lb 4 oz (115.8 kg)  10/10/22 247 lb (112 kg)  07/04/22 238 lb 12.8 oz (108.3 kg)     ASSESSMENT & PLAN:   Permanent A-fib: Ventricular rates remain well-controlled on Cardizem CD 180 mg daily and metoprolol succinate 50 mg daily.  He does note some mild orthostasis though does not drink much water.  Encouraged him to increase water intake.  CHA2DS2-VASc at least 4 (HTN, age x 1, DM, vascular disease).  He remains on rivaroxaban 20 mg with a creatinine clearance of 77.8.  No bleeding concerns.  PCP has placed future orders for labs are in Epic, will review these when obtained at outside office.  CAD involving the native coronary arteries without angina: He is doing well and is without symptoms concerning for angina or decompensation.  He remains on rivaroxaban in place of aspirin given underlying permanent A-fib in an effort to minimize bleeding risk.  Continue aggressive risk factor modification and secondary prevention including atorvastatin, ezetimibe, Imdur, and metoprolol.  No indication for further ischemic testing at this time.  HTN: Blood pressure is well-controlled in the office today.  Continue current therapy.  HLD: LDL 29 in 06/2022 with normal AST/ALT at that time.  He remains on atorvastatin and ezetimibe.    Disposition: F/u with Dr. Rockey Situ or an APP in 12 months.   Medication Adjustments/Labs and Tests Ordered: Current medicines are reviewed at length with the patient today.  Concerns regarding medicines are outlined above.  Medication changes, Labs and Tests ordered today are summarized above and listed in the Patient Instructions accessible in Encounters.   SignedChristell Faith, PA-C 01/09/2023 12:06 PM     [address] [place], [place] [address] [phone]

## 2023-01-09 ENCOUNTER — Telehealth: Payer: Self-pay

## 2023-01-09 ENCOUNTER — Ambulatory Visit: Payer: Medicare HMO | Attending: Physician Assistant | Admitting: Physician Assistant

## 2023-01-09 ENCOUNTER — Encounter: Payer: Self-pay | Admitting: Physician Assistant

## 2023-01-09 VITALS — BP 120/60 | HR 77 | Ht 70.0 in | Wt 255.2 lb

## 2023-01-09 DIAGNOSIS — I4821 Permanent atrial fibrillation: Secondary | ICD-10-CM | POA: Diagnosis not present

## 2023-01-09 DIAGNOSIS — I2510 Atherosclerotic heart disease of native coronary artery without angina pectoris: Secondary | ICD-10-CM

## 2023-01-09 DIAGNOSIS — E785 Hyperlipidemia, unspecified: Secondary | ICD-10-CM | POA: Diagnosis not present

## 2023-01-09 DIAGNOSIS — I10 Essential (primary) hypertension: Secondary | ICD-10-CM | POA: Diagnosis not present

## 2023-01-09 NOTE — Progress Notes (Cosign Needed)
Patient assistance application for Farxiga/Breztri has been completed on behalf of the patient and will be mailed out on 01/19/23.  Spoke with patient and he aware that he will need to provide requested income verification to be sent the manufacturer.  Patient will sign and date his portion of the application and bring to [place] office to be sign by the provider. Application will then be faxed to [employer].  [name]

## 2023-01-09 NOTE — Patient Instructions (Signed)
Medication Instructions:  No changes at this time.   *If you need a refill on your cardiac medications before your next appointment, please call your pharmacy*   Lab Work: None  If you have labs (blood work) drawn today and your tests are completely normal, you will receive your results only by: [place] (if you have MyChart) OR A paper copy in the mail If you have any lab test that is abnormal or we need to change your treatment, we will call you to review the results.   Testing/Procedures: None   Follow-Up: At [facility], you and your health needs are our priority.  As part of our continuing mission to provide you with exceptional heart care, we have created designated Provider Care Teams.  These Care Teams include your primary Cardiologist (physician) and Advanced Practice Providers (APPs -  Physician Assistants and Nurse Practitioners) who all work together to provide you with the care you need, when you need it.  Your next appointment:   1 year(s)  Provider:   Ida Rogue, MD or Christell Faith, PA-C

## 2023-01-10 ENCOUNTER — Ambulatory Visit: Payer: Medicare HMO | Admitting: Nurse Practitioner

## 2023-01-12 ENCOUNTER — Other Ambulatory Visit: Payer: Self-pay | Admitting: Nurse Practitioner

## 2023-01-12 MED ORDER — METOPROLOL SUCCINATE ER 50 MG PO TB24: ORAL_TABLET | ORAL | 4 refills | Status: DC

## 2023-01-14 NOTE — Patient Instructions (Incomplete)
Diabetes Mellitus Basics  Diabetes mellitus, or diabetes, is a long-term (chronic) disease. It occurs when the body does not properly use sugar (glucose) that is released from food after you eat. Diabetes mellitus may be caused by one or both of these problems: Your pancreas does not make enough of a hormone called insulin. Your body does not react in a normal way to the insulin that it makes. Insulin lets glucose enter cells in your body. This gives you energy. If you have diabetes, glucose cannot get into cells. This causes high blood glucose (hyperglycemia). How to treat and manage diabetes You may need to take insulin or other diabetes medicines daily to keep your glucose in balance. If you are prescribed insulin, you will learn how to give yourself insulin by injection. You may need to adjust the amount of insulin you take based on the foods that you eat. You will need to check your blood glucose levels using a glucose monitor as told by your health care provider. The readings can help determine if you have low or high blood glucose. Generally, you should have these blood glucose levels: Before meals (preprandial): 80-130 mg/dL (4.4-7.2 mmol/L). After meals (postprandial): below 180 mg/dL (10 mmol/L). Hemoglobin A1c (HbA1c) level: less than 7%. Your health care provider will set treatment goals for you. Keep all follow-up visits. This is important. Follow these instructions at home: Diabetes medicines Take your diabetes medicines every day as told by your health care provider. List your diabetes medicines here: Name of medicine: ______________________________ Amount (dose): _______________ Time (a.m./p.m.): _______________ Notes: ___________________________________ Name of medicine: ______________________________ Amount (dose): _______________ Time (a.m./p.m.): _______________ Notes: ___________________________________ Name of medicine: ______________________________ Amount (dose):  _______________ Time (a.m./p.m.): _______________ Notes: ___________________________________ Insulin If you use insulin, list the types of insulin you use here: Insulin type: ______________________________ Amount (dose): _______________ Time (a.m./p.m.): _______________Notes: ___________________________________ Insulin type: ______________________________ Amount (dose): _______________ Time (a.m./p.m.): _______________ Notes: ___________________________________ Insulin type: ______________________________ Amount (dose): _______________ Time (a.m./p.m.): _______________ Notes: ___________________________________ Insulin type: ______________________________ Amount (dose): _______________ Time (a.m./p.m.): _______________ Notes: ___________________________________ Insulin type: ______________________________ Amount (dose): _______________ Time (a.m./p.m.): _______________ Notes: ___________________________________ Managing blood glucose  Check your blood glucose levels using a glucose monitor as told by your health care provider. Write down the times that you check your glucose levels here: Time: _______________ Notes: ___________________________________ Time: _______________ Notes: ___________________________________ Time: _______________ Notes: ___________________________________ Time: _______________ Notes: ___________________________________ Time: _______________ Notes: ___________________________________ Time: _______________ Notes: ___________________________________  Low blood glucose Low blood glucose (hypoglycemia) is when glucose is at or below 70 mg/dL (3.9 mmol/L). Symptoms may include: Feeling: Hungry. Sweaty and clammy. Irritable or easily upset. Dizzy. Sleepy. Having: A fast heartbeat. A headache. A change in your vision. Numbness around the mouth, lips, or tongue. Having trouble with: Moving (coordination). Sleeping. Treating low blood glucose To treat low blood  glucose, eat or drink something containing sugar right away. If you can think clearly and swallow safely, follow the 15:15 rule: Take 15 grams of a fast-acting carb (carbohydrate), as told by your health care provider. Some fast-acting carbs are: Glucose tablets: take 3-4 tablets. Hard candy: eat 3-5 pieces. Fruit juice: drink 4 oz (120 mL). Regular (not diet) soda: drink 4-6 oz (120-180 mL). Honey or sugar: eat 1 Tbsp (15 mL). Check your blood glucose levels 15 minutes after you take the carb. If your glucose is still at or below 70 mg/dL (3.9 mmol/L), take 15 grams of a carb again. If your glucose does not go above 70 mg/dL (3.9 mmol/L) after   3 tries, get help right away. After your glucose goes back to normal, eat a meal or a snack within 1 hour. Treating very low blood glucose If your glucose is at or below 54 mg/dL (3 mmol/L), you have very low blood glucose (severe hypoglycemia). This is an emergency. Do not wait to see if the symptoms will go away. Get medical help right away. Call your local emergency services (911 in the [place] Do not drive yourself to the hospital. Questions to ask your health care provider Should I talk with a diabetes educator? What equipment will I need to care for myself at home? What diabetes medicines do I need? When should I take them? How often do I need to check my blood glucose levels? What number can I call if I have questions? When is my follow-up visit? Where can I find a support group for people with diabetes? Where to find more information American Diabetes Association: www.diabetes.org Association of Diabetes Care and Education Specialists: www.diabeteseducator.org Contact a health care provider if: Your blood glucose is at or above 240 mg/dL (13.3 mmol/L) for 2 days in a row. You have been sick or have had a fever for 2 days or more, and you are not getting better. You have any of these problems for more than 6 hours: You cannot eat or  drink. You feel nauseous. You vomit. You have diarrhea. Get help right away if: Your blood glucose is lower than 54 mg/dL (3 mmol/L). You get confused. You have trouble thinking clearly. You have trouble breathing. These symptoms may represent a serious problem that is an emergency. Do not wait to see if the symptoms will go away. Get medical help right away. Call your local emergency services (911 in the [place] Do not drive yourself to the hospital. Summary Diabetes mellitus is a chronic disease that occurs when the body does not properly use sugar (glucose) that is released from food after you eat. Take insulin and diabetes medicines as told. Check your blood glucose every day, as often as told. Keep all follow-up visits. This is important. This information is not intended to replace advice given to you by your health care provider. Make sure you discuss any questions you have with your health care provider. Document Revised: 04/06/2020 Document Reviewed: 04/06/2020 Elsevier Patient Education  2023 Elsevier Inc.  

## 2023-01-16 ENCOUNTER — Other Ambulatory Visit: Payer: Self-pay | Admitting: Physician Assistant

## 2023-01-16 DIAGNOSIS — I482 Chronic atrial fibrillation, unspecified: Secondary | ICD-10-CM

## 2023-01-17 NOTE — Telephone Encounter (Signed)
Requested medication (s) are due for refill today: yes  Requested medication (s) are on the active medication list: yes    Last refill: 11/22/22  #90  0 refills  Future visit scheduled yes 01/19/23  Notes to clinic:Failed due to labs, please review. Thank you.  Requested Prescriptions  Pending Prescriptions Disp Refills   furosemide (LASIX) 20 MG tablet [Pharmacy Med Name: furosemide 20 mg tablet] 90 tablet 3    Sig: TAKE ONE TABLET BY MOUTH EVERY MORNING and TAKE ONE TABLET BY MOUTH EVERYDAY AT BEDTIME     Cardiovascular:  Diuretics - Loop Failed - 01/16/2023  2:23 PM      Failed - K in normal range and within 180 days    Potassium  Date Value Ref Range Status  07/04/2022 4.5 3.5 - 5.2 mmol/L Final  07/25/2014 3.7 3.5 - 5.1 mmol/L Final         Failed - Ca in normal range and within 180 days    Calcium  Date Value Ref Range Status  07/04/2022 10.2 8.6 - 10.2 mg/dL Final   Calcium, Total  Date Value Ref Range Status  07/25/2014 8.9 8.5 - 10.1 mg/dL Final   Calcium, Ion  Date Value Ref Range Status  07/27/2010 1.08 (L) 1.12 - 1.32 mmol/L Final         Failed - Na in normal range and within 180 days    Sodium  Date Value Ref Range Status  07/04/2022 141 134 - 144 mmol/L Final  07/25/2014 139 136 - 145 mmol/L Final         Failed - Cr in normal range and within 180 days    Creatinine  Date Value Ref Range Status  07/25/2014 1.21 0.60 - 1.30 mg/dL Final   Creatinine, Ser  Date Value Ref Range Status  07/04/2022 1.39 (H) 0.76 - 1.27 mg/dL Final         Failed - Cl in normal range and within 180 days    Chloride  Date Value Ref Range Status  07/04/2022 99 96 - 106 mmol/L Final  07/25/2014 105 98 - 107 mmol/L Final         Failed - Mg Level in normal range and within 180 days    Magnesium  Date Value Ref Range Status  07/25/2014 1.6 (L) mg/dL Final    Comment:    1.8-2.4 THERAPEUTIC RANGE: 4-7 mg/dL TOXIC: > 10 mg/dL  -----------------------           Passed - Last BP in normal range    BP Readings from Last 1 Encounters:  01/09/23 120/60         Passed - Valid encounter within last 6 months    Recent Outpatient Visits           3 months ago Type 2 diabetes mellitus with morbid obesity ([place])   [place] [facility] Mecum, Erin E, PA-C   6 months ago Type 2 diabetes mellitus with morbid obesity ([place])   [place] Kathrine Haddock, NP   9 months ago Type 2 diabetes mellitus with morbid obesity ([place])   [place] [facility] [name], [name], MD   1 year ago Hyperlipidemia associated with type 2 diabetes mellitus ([place])    [facility] [name], [name], MD   1 year ago Restless legs   [place] [name], MD       Future Appointments  In 2 days Venita Lick, NP [place]

## 2023-01-19 ENCOUNTER — Ambulatory Visit: Payer: Medicare HMO | Admitting: Nurse Practitioner

## 2023-01-19 DIAGNOSIS — J432 Centrilobular emphysema: Secondary | ICD-10-CM

## 2023-01-19 DIAGNOSIS — R7989 Other specified abnormal findings of blood chemistry: Secondary | ICD-10-CM

## 2023-01-19 DIAGNOSIS — I152 Hypertension secondary to endocrine disorders: Secondary | ICD-10-CM

## 2023-01-19 DIAGNOSIS — K219 Gastro-esophageal reflux disease without esophagitis: Secondary | ICD-10-CM

## 2023-01-19 DIAGNOSIS — I700 Atherosclerosis of aorta: Secondary | ICD-10-CM

## 2023-01-19 DIAGNOSIS — E1169 Type 2 diabetes mellitus with other specified complication: Secondary | ICD-10-CM

## 2023-01-19 DIAGNOSIS — I25111 Atherosclerotic heart disease of native coronary artery with angina pectoris with documented spasm: Secondary | ICD-10-CM

## 2023-01-19 DIAGNOSIS — E559 Vitamin D deficiency, unspecified: Secondary | ICD-10-CM

## 2023-01-19 DIAGNOSIS — Z23 Encounter for immunization: Secondary | ICD-10-CM

## 2023-01-19 DIAGNOSIS — G4733 Obstructive sleep apnea (adult) (pediatric): Secondary | ICD-10-CM

## 2023-01-19 DIAGNOSIS — G2581 Restless legs syndrome: Secondary | ICD-10-CM

## 2023-01-19 DIAGNOSIS — E538 Deficiency of other specified B group vitamins: Secondary | ICD-10-CM

## 2023-01-19 DIAGNOSIS — I4811 Longstanding persistent atrial fibrillation: Secondary | ICD-10-CM

## 2023-01-22 ENCOUNTER — Other Ambulatory Visit: Payer: Self-pay | Admitting: Nurse Practitioner

## 2023-01-23 NOTE — Telephone Encounter (Signed)
Requested medication (s) are due for refill today: yes  Requested medication (s) are on the active medication list: yes  Last refill:  07/31/22 #30/3  Future visit scheduled: yes  Notes to clinic:  Unable to refill per protocol due to failed labs, no updated results.     Requested Prescriptions  Pending Prescriptions Disp Refills   XARELTO 20 MG TABS tablet [Pharmacy Med Name: Xarelto 20 mg tablet] 30 tablet 3    Sig: TAKE ONE TABLET BY MOUTH EVERY EVENING     Hematology: Anticoagulants - rivaroxaban Failed - 01/22/2023  3:10 PM      Failed - Cr in normal range and within 360 days    Creatinine  Date Value Ref Range Status  07/25/2014 1.21 0.60 - 1.30 mg/dL Final   Creatinine, Ser  Date Value Ref Range Status  07/04/2022 1.39 (H) 0.76 - 1.27 mg/dL Final         Failed - HCT in normal range and within 360 days    HCT  Date Value Ref Range Status  11/14/2021 41.6 39.0 - 52.0 % Final   Hematocrit  Date Value Ref Range Status  08/30/2021 42.2 37.5 - 51.0 % Final         Failed - HGB in normal range and within 360 days    Hemoglobin  Date Value Ref Range Status  11/14/2021 13.5 13.0 - 17.0 g/dL Final  08/30/2021 13.4 13.0 - 17.7 g/dL Final         Failed - PLT in normal range and within 360 days    Platelets  Date Value Ref Range Status  11/14/2021 170 150 - 400 K/uL Final  08/30/2021 171 150 - 450 x10E3/uL Final         Passed - ALT in normal range and within 360 days    ALT  Date Value Ref Range Status  07/04/2022 39 0 - 44 IU/L Final   ALT (SGPT) Piccolo, Waived  Date Value Ref Range Status  08/01/2017 33 10 - 47 U/L Final         Passed - AST in normal range and within 360 days    AST  Date Value Ref Range Status  07/04/2022 40 0 - 40 IU/L Final   AST (SGOT) Piccolo, Waived  Date Value Ref Range Status  08/01/2017 34 11 - 38 U/L Final         Passed - eGFR is 15 or above and within 360 days    EGFR (African American)  Date Value Ref Range Status   07/25/2014 >60  Final   GFR calc Af Amer  Date Value Ref Range Status  11/19/2020 81 >59 mL/min/1.73 Final    Comment:    **In accordance with recommendations from the NKF-ASN Task force,**   [facility] is in the process of updating its eGFR calculation to the   2021 CKD-EPI creatinine equation that estimates kidney function   without a race variable.    EGFR (Non-African Amer.)  Date Value Ref Range Status  07/25/2014 >60  Final    Comment:    eGFR values <20m/min/1.73 m2 may be an indication of chronic kidney disease (CKD). Calculated eGFR is useful in patients with stable renal function. The eGFR calculation will not be reliable in acutely ill patients when serum creatinine is changing rapidly. It is not useful in  patients on dialysis. The eGFR calculation may not be applicable to patients at the low and high extremes of body sizes, pregnant women,  and vegetarians.    GFR, Estimated  Date Value Ref Range Status  04/04/2021 >60 >60 mL/min Final    Comment:    (NOTE) Calculated using the CKD-EPI Creatinine Equation (2021)    eGFR  Date Value Ref Range Status  07/04/2022 54 (L) >59 mL/min/1.73 Final         Passed - Patient is not pregnant      Passed - Valid encounter within last 12 months    Recent Outpatient Visits           3 months ago Type 2 diabetes mellitus with morbid obesity ([name])   [place] [name], Ernest [name]   6 months ago Type 2 diabetes mellitus with morbid obesity ([place])   [place] Ernest [name], Ernest [name]   9 months ago Type 2 diabetes mellitus with morbid obesity ([place])   [facility] Ernest [name], Ernest [name]   1 year ago Hyperlipidemia associated with type 2 diabetes mellitus ([place])   [place] [name] Ernest [name], Ernest [name]   1 year ago Restless legs   [place] Ernest [name], Ernest [name]       Future Appointments             Tomorrow Ernest,  Ernest [name], [name], [facility]

## 2023-01-24 ENCOUNTER — Encounter: Payer: Self-pay | Admitting: Nurse Practitioner

## 2023-01-24 ENCOUNTER — Ambulatory Visit (INDEPENDENT_AMBULATORY_CARE_PROVIDER_SITE_OTHER): Payer: Medicare HMO | Admitting: Nurse Practitioner

## 2023-01-24 DIAGNOSIS — G2581 Restless legs syndrome: Secondary | ICD-10-CM | POA: Diagnosis not present

## 2023-01-24 DIAGNOSIS — E785 Hyperlipidemia, unspecified: Secondary | ICD-10-CM

## 2023-01-24 DIAGNOSIS — E1159 Type 2 diabetes mellitus with other circulatory complications: Secondary | ICD-10-CM | POA: Diagnosis not present

## 2023-01-24 DIAGNOSIS — E559 Vitamin D deficiency, unspecified: Secondary | ICD-10-CM

## 2023-01-24 DIAGNOSIS — G4733 Obstructive sleep apnea (adult) (pediatric): Secondary | ICD-10-CM

## 2023-01-24 DIAGNOSIS — I152 Hypertension secondary to endocrine disorders: Secondary | ICD-10-CM

## 2023-01-24 DIAGNOSIS — I25111 Atherosclerotic heart disease of native coronary artery with angina pectoris with documented spasm: Secondary | ICD-10-CM

## 2023-01-24 DIAGNOSIS — D500 Iron deficiency anemia secondary to blood loss (chronic): Secondary | ICD-10-CM | POA: Diagnosis not present

## 2023-01-24 DIAGNOSIS — E538 Deficiency of other specified B group vitamins: Secondary | ICD-10-CM

## 2023-01-24 DIAGNOSIS — J439 Emphysema, unspecified: Secondary | ICD-10-CM | POA: Diagnosis not present

## 2023-01-24 DIAGNOSIS — E1169 Type 2 diabetes mellitus with other specified complication: Secondary | ICD-10-CM | POA: Diagnosis not present

## 2023-01-24 DIAGNOSIS — R7989 Other specified abnormal findings of blood chemistry: Secondary | ICD-10-CM

## 2023-01-24 DIAGNOSIS — I700 Atherosclerosis of aorta: Secondary | ICD-10-CM

## 2023-01-24 DIAGNOSIS — Z23 Encounter for immunization: Secondary | ICD-10-CM

## 2023-01-24 DIAGNOSIS — K219 Gastro-esophageal reflux disease without esophagitis: Secondary | ICD-10-CM

## 2023-01-24 DIAGNOSIS — I4811 Longstanding persistent atrial fibrillation: Secondary | ICD-10-CM

## 2023-01-24 DIAGNOSIS — N1831 Chronic kidney disease, stage 3a: Secondary | ICD-10-CM | POA: Diagnosis not present

## 2023-01-24 MED ORDER — CLOTRIMAZOLE-BETAMETHASONE 1-0.05 % EX CREA: 1.0000 | TOPICAL_CREAM | Freq: Two times a day (BID) | CUTANEOUS | 2 refills | Status: DC

## 2023-01-24 MED ORDER — PANTOPRAZOLE SODIUM 40 MG PO TBEC: DELAYED_RELEASE_TABLET | ORAL | 4 refills | Status: DC

## 2023-01-24 MED ORDER — DAPAGLIFLOZIN PROPANEDIOL 10 MG PO TABS: 10.0000 mg | ORAL_TABLET | Freq: Every day | ORAL | 4 refills | Status: DC

## 2023-01-24 MED ORDER — NITROGLYCERIN 0.4 MG SL SUBL: 0.4000 mg | SUBLINGUAL_TABLET | SUBLINGUAL | 2 refills | Status: DC | PRN

## 2023-01-24 MED ORDER — METOPROLOL SUCCINATE ER 50 MG PO TB24: ORAL_TABLET | ORAL | 4 refills | Status: DC

## 2023-01-24 NOTE — Assessment & Plan Note (Signed)
Noted on imaging 03/05/18.  Continue statin therapy and Eliquis for prevention.

## 2023-01-24 NOTE — Assessment & Plan Note (Signed)
Chronic, stable.  Continue current supplements and return to hematology as needed.

## 2023-01-24 NOTE — Assessment & Plan Note (Signed)
BMI 36.26 with T2DM, HTN/HLD.  Recommended eating smaller high protein, low fat meals more frequently and exercising 30 mins a day 5 times a week with a goal of 10-15lb weight loss in the next 3 months. Patient voiced their understanding and motivation to adhere to these recommendations.

## 2023-01-24 NOTE — Assessment & Plan Note (Signed)
Noted on past labs, check today and start supplement as needed.

## 2023-01-24 NOTE — Assessment & Plan Note (Signed)
Chronic, ongoing with last A1c 7.2%.  Recheck today and adjust regimen as needed.  Check urine ALB today, continue Ramipril for kidney protection.  Check BS 2-3 times a day.  Recommend heavy focus on diabetic diet and maintain current regimen at this time.  Return in 3 months.

## 2023-01-24 NOTE — Assessment & Plan Note (Signed)
Chronic, ongoing.  Rate controlled.  Continue collaboration with cardiology and current medication regimen as ordered by them.

## 2023-01-24 NOTE — Assessment & Plan Note (Signed)
Noted on past labs, denies symptom.  Recheck labs today.

## 2023-01-24 NOTE — Assessment & Plan Note (Signed)
Chronic, no use of CPAP.  Recommend he use 100% of the time.

## 2023-01-24 NOTE — Assessment & Plan Note (Signed)
Noted on CT February 2018, continue statin therapy as ordered.

## 2023-01-24 NOTE — Patient Instructions (Signed)
Diabetes Mellitus Basics  Diabetes mellitus, or diabetes, is a long-term (chronic) disease. It occurs when the body does not properly use sugar (glucose) that is released from food after you eat. Diabetes mellitus may be caused by one or both of these problems: Your pancreas does not make enough of a hormone called insulin. Your body does not react in a normal way to the insulin that it makes. Insulin lets glucose enter cells in your body. This gives you energy. If you have diabetes, glucose cannot get into cells. This causes high blood glucose (hyperglycemia). How to treat and manage diabetes You may need to take insulin or other diabetes medicines daily to keep your glucose in balance. If you are prescribed insulin, you will learn how to give yourself insulin by injection. You may need to adjust the amount of insulin you take based on the foods that you eat. You will need to check your blood glucose levels using a glucose monitor as told by your health care provider. The readings can help determine if you have low or high blood glucose. Generally, you should have these blood glucose levels: Before meals (preprandial): 80-130 mg/dL (4.4-7.2 mmol/L). After meals (postprandial): below 180 mg/dL (10 mmol/L). Hemoglobin A1c (HbA1c) level: less than 7%. Your health care provider will set treatment goals for you. Keep all follow-up visits. This is important. Follow these instructions at home: Diabetes medicines Take your diabetes medicines every day as told by your health care provider. List your diabetes medicines here: Name of medicine: ______________________________ Amount (dose): _______________ Time (a.m./p.m.): _______________ Notes: ___________________________________ Name of medicine: ______________________________ Amount (dose): _______________ Time (a.m./p.m.): _______________ Notes: ___________________________________ Name of medicine: ______________________________ Amount (dose):  _______________ Time (a.m./p.m.): _______________ Notes: ___________________________________ Insulin If you use insulin, list the types of insulin you use here: Insulin type: ______________________________ Amount (dose): _______________ Time (a.m./p.m.): _______________Notes: ___________________________________ Insulin type: ______________________________ Amount (dose): _______________ Time (a.m./p.m.): _______________ Notes: ___________________________________ Insulin type: ______________________________ Amount (dose): _______________ Time (a.m./p.m.): _______________ Notes: ___________________________________ Insulin type: ______________________________ Amount (dose): _______________ Time (a.m./p.m.): _______________ Notes: ___________________________________ Insulin type: ______________________________ Amount (dose): _______________ Time (a.m./p.m.): _______________ Notes: ___________________________________ Managing blood glucose  Check your blood glucose levels using a glucose monitor as told by your health care provider. Write down the times that you check your glucose levels here: Time: _______________ Notes: ___________________________________ Time: _______________ Notes: ___________________________________ Time: _______________ Notes: ___________________________________ Time: _______________ Notes: ___________________________________ Time: _______________ Notes: ___________________________________ Time: _______________ Notes: ___________________________________  Low blood glucose Low blood glucose (hypoglycemia) is when glucose is at or below 70 mg/dL (3.9 mmol/L). Symptoms may include: Feeling: Hungry. Sweaty and clammy. Irritable or easily upset. Dizzy. Sleepy. Having: A fast heartbeat. A headache. A change in your vision. Numbness around the mouth, lips, or tongue. Having trouble with: Moving (coordination). Sleeping. Treating low blood glucose To treat low blood  glucose, eat or drink something containing sugar right away. If you can think clearly and swallow safely, follow the 15:15 rule: Take 15 grams of a fast-acting carb (carbohydrate), as told by your health care provider. Some fast-acting carbs are: Glucose tablets: take 3-4 tablets. Hard candy: eat 3-5 pieces. Fruit juice: drink 4 oz (120 mL). Regular (not diet) soda: drink 4-6 oz (120-180 mL). Honey or sugar: eat 1 Tbsp (15 mL). Check your blood glucose levels 15 minutes after you take the carb. If your glucose is still at or below 70 mg/dL (3.9 mmol/L), take 15 grams of a carb again. If your glucose does not go above 70 mg/dL (3.9 mmol/L) after   3 tries, get help right away. After your glucose goes back to normal, eat a meal or a snack within 1 hour. Treating very low blood glucose If your glucose is at or below 54 mg/dL (3 mmol/L), you have very low blood glucose (severe hypoglycemia). This is an emergency. Do not wait to see if the symptoms will go away. Get medical help right away. Call your local emergency services (911 in the [place] Do not drive yourself to the hospital. Questions to ask your health care provider Should I talk with a diabetes educator? What equipment will I need to care for myself at home? What diabetes medicines do I need? When should I take them? How often do I need to check my blood glucose levels? What number can I call if I have questions? When is my follow-up visit? Where can I find a support group for people with diabetes? Where to find more information American Diabetes Association: www.diabetes.org Association of Diabetes Care and Education Specialists: www.diabeteseducator.org Contact a health care provider if: Your blood glucose is at or above 240 mg/dL (13.3 mmol/L) for 2 days in a row. You have been sick or have had a fever for 2 days or more, and you are not getting better. You have any of these problems for more than 6 hours: You cannot eat or  drink. You feel nauseous. You vomit. You have diarrhea. Get help right away if: Your blood glucose is lower than 54 mg/dL (3 mmol/L). You get confused. You have trouble thinking clearly. You have trouble breathing. These symptoms may represent a serious problem that is an emergency. Do not wait to see if the symptoms will go away. Get medical help right away. Call your local emergency services (911 in the [place] Do not drive yourself to the hospital. Summary Diabetes mellitus is a chronic disease that occurs when the body does not properly use sugar (glucose) that is released from food after you eat. Take insulin and diabetes medicines as told. Check your blood glucose every day, as often as told. Keep all follow-up visits. This is important. This information is not intended to replace advice given to you by your health care provider. Make sure you discuss any questions you have with your health care provider. Document Revised: 04/06/2020 Document Reviewed: 04/06/2020 Elsevier Patient Education  2023 Elsevier Inc.  

## 2023-01-24 NOTE — Assessment & Plan Note (Signed)
Chronic, ongoing. Continue current medication regimen and adjust as needed.  Lipid panel today. 

## 2023-01-24 NOTE — Assessment & Plan Note (Signed)
Chronic, ongoing.  Continue collaboration with cardiology.  Minimal use of NTG.

## 2023-01-24 NOTE — Assessment & Plan Note (Signed)
Chronic, ongoing with recent spirometry stable.  Levels >80%.  Continue current inhaler regimen and adjust as needed.

## 2023-01-24 NOTE — Assessment & Plan Note (Signed)
Chronic, ongoing.  At this time continue Ramipril for kidney protection.  Urine Alb and CMP on labs today.

## 2023-01-24 NOTE — Assessment & Plan Note (Signed)
Chronic, ongoing.  Continue Gabapentin at HS, which offers benefit.  Check B12 level today and start supplement as needed.

## 2023-01-24 NOTE — Assessment & Plan Note (Signed)
Chronic, ongoing wit BP at goal for age today.  Recommend he monitor BP at least a few mornings a week at home and document.  DASH diet at home.  Continue current medication regimen and adjust as needed.  Labs today: CMP, CBC, urine ALB.  Return in 3 months.

## 2023-01-24 NOTE — Assessment & Plan Note (Signed)
Chronic, ongoing.  Continue current medication regimen and adjust as needed.  Mag level today.  Risks of PPI use were discussed with patient including bone loss, C. Diff diarrhea, pneumonia, infections, CKD, electrolyte abnormalities.  Verbalizes understanding and chooses to continue the medication.

## 2023-01-24 NOTE — Progress Notes (Signed)
BP 136/83   Pulse 80   Temp 97.6 F (36.4 C) (Oral)   Ht '5\' 10"'$  (1.778 m)   Wt 252 lb 11.2 oz (114.6 kg)   SpO2 98%   BMI 36.26 kg/m    Subjective:    Patient ID: Ernest [name], male    DOB: 02/19/1951, 72 y.o.   MRN: [id number]  HPI: Ernest [name] is a 72 y.o. male  Chief Complaint  Patient presents with   Diabetes   DIABETES A1c in October 7.2%.  Continues on Metformin XR 500 MG BID and Farxiga.  Has never taken 1000 MG Metformin BID.    Taking Gabapentin for RLS at night, without it can not sleep.  History of low B12, but is not taking supplement. Hypoglycemic episodes:no Polydipsia/polyuria: no Visual disturbance: no Chest pain: no Paresthesias: no Glucose Monitoring: yes  Accucheck frequency:  when remembers  Fasting glucose:  Post prandial:  Evening:  Before meals: Taking Insulin?: no  Long acting insulin:  Short acting insulin: Blood Pressure Monitoring: weekly Retinal Examination: Up to Date -- [place] Foot Exam: Up to Date Pneumovax: Up to Date Influenza: Up to Date Aspirin: no   HYPERTENSION / HYPERLIPIDEMIA Follows with cardiology, last seen 01/09/23.  Continues on Imdur, Lasix, Cardizem, Zetia, Atorvastatin, Ramipril.  Has CPAP at home, but when wakes up it is often lying on the floor.  History of elevation on TSH -- denies symptoms. Satisfied with current treatment? yes Duration of hypertension: chronic BP monitoring frequency: weekly BP range: 130/80 range at home BP medication side effects: no Duration of hyperlipidemia: chronic Cholesterol medication side effects: no Cholesterol supplements: none Medication compliance: good compliance Aspirin: no Recent stressors: no Recurrent headaches: no Visual changes: no Palpitations: occasional Dyspnea: no Chest pain: occasional at baseline, has not had to use NTG in one month Lower extremity edema: no Dizzy/lightheaded: no   ATRIAL FIBRILLATION Continues on Xarelto and  Metoprolol. Atrial fibrillation status: stable Satisfied with current treatment: yes  Medication side effects:  no Medication compliance: good compliance Etiology of atrial fibrillation:  Palpitations:   occasional Chest pain:  no Dyspnea on exertion:  no Orthopnea:  no Syncope:  no Edema:  no Ventricular rate control: B-blocker Anti-coagulation: long acting   GERD Continues on Protonix with benefit.  Saw hematology in past for iron deficiency anemia, last 08/09/21.  Had iron infusions x 2, to return as needed.  GERD control status: stable Satisfied with current treatment? yes Heartburn frequency: no Medication side effects: no  Medication compliance: stable Dysphagia: no Odynophagia:  no Hematemesis: no Blood in stool: no EGD: yes   CHRONIC KIDNEY DISEASE CKD status: stable Medications renally dose: yes Previous renal evaluation: no Pneumovax:  Up to Date Influenza Vaccine:  Up to Date   COPD Continues on Breztri and Albuterol. Was a smoker 30 years ago, smoked about 4 PPD -- went and got hypnotized and quit. COPD status: stable Satisfied with current treatment?: yes Oxygen use: no Dyspnea frequency: no Cough frequency: no Rescue inhaler frequency:   Limitation of activity: no Productive cough: no Last Spirometry: 07/04/22 --- FEV1/FVC >80% and FEV1 similar Pneumovax: Up to Date Influenza: Up to Date   Relevant past medical, surgical, family and social history reviewed and updated as indicated. Interim medical history since our last visit reviewed. Allergies and medications reviewed and updated.  Review of Systems  Constitutional:  Negative for activity change, diaphoresis, fatigue and fever.  Respiratory:  Negative for cough, chest tightness, shortness  of breath and wheezing.   Cardiovascular:  Negative for chest pain, palpitations and leg swelling.  Gastrointestinal: Negative.   Endocrine: Negative for cold intolerance, heat intolerance, polydipsia,  polyphagia and polyuria.  Neurological: Negative.   Psychiatric/Behavioral: Negative.     Per HPI unless specifically indicated above     Objective:    BP 136/83   Pulse 80   Temp 97.6 F (36.4 C) (Oral)   Ht '5\' 10"'$  (1.778 m)   Wt 252 lb 11.2 oz (114.6 kg)   SpO2 98%   BMI 36.26 kg/m   Wt Readings from Last 3 Encounters:  01/24/23 252 lb 11.2 oz (114.6 kg)  01/09/23 255 lb 4 oz (115.8 kg)  10/10/22 247 lb (112 kg)    Physical Exam Vitals and nursing note reviewed.  Constitutional:      General: He is awake. He is not in acute distress.    Appearance: He is well-developed and well-groomed. He is obese. He is not ill-appearing or toxic-appearing.  HENT:     Head: Normocephalic.     Right Ear: Hearing and external ear normal.     Left Ear: Hearing and external ear normal.  Eyes:     General: Lids are normal.     Extraocular Movements: Extraocular movements intact.     Conjunctiva/sclera: Conjunctivae normal.  Neck:     Thyroid: No thyromegaly.     Vascular: No carotid bruit.  Cardiovascular:     Rate and Rhythm: Normal rate and regular rhythm.     Heart sounds: Normal heart sounds.  Pulmonary:     Effort: Pulmonary effort is normal. No accessory muscle usage or respiratory distress.     Breath sounds: Normal breath sounds.  Abdominal:     General: Bowel sounds are normal. There is no distension.     Palpations: Abdomen is soft.     Tenderness: There is no abdominal tenderness.  Musculoskeletal:     Cervical back: Full passive range of motion without pain.     Right lower leg: No edema.     Left lower leg: No edema.  Lymphadenopathy:     Cervical: No cervical adenopathy.  Skin:    General: Skin is warm.     Capillary Refill: Capillary refill takes less than 2 seconds.  Neurological:     Mental Status: He is alert and oriented to person, place, and time.     Deep Tendon Reflexes: Reflexes are normal and symmetric.     Reflex Scores:      Brachioradialis  reflexes are 2+ on the right side and 2+ on the left side.      Patellar reflexes are 2+ on the right side and 2+ on the left side. Psychiatric:        Attention and Perception: Attention normal.        Mood and Affect: Mood normal.        Speech: Speech normal.        Behavior: Behavior normal. Behavior is cooperative.        Thought Content: Thought content normal.    Diabetic Foot Exam - Simple   Simple Foot Form Visual Inspection See comments: Yes Sensation Testing Intact to touch and monofilament testing bilaterally: Yes Pulse Check Posterior Tibialis and Dorsalis pulse intact bilaterally: Yes Comments Xerosis to both foot.     Results for orders placed or performed in visit on 10/10/22  Bayer DCA Hb A1c Waived (STAT)  Result Value Ref Range   HB  A1C (BAYER DCA - WAIVED) 7.2 (H) 4.8 - 5.6 %      Assessment & Plan:   Problem List Items Addressed This Visit       Cardiovascular and Mediastinum   Aortic atherosclerosis ([place])    Noted on imaging 03/05/18.  Continue statin therapy and Eliquis for prevention.      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  Continue collaboration with cardiology.  Minimal use of NTG.      Relevant Medications   nitroGLYCERIN (NITROSTAT) 0.4 MG SL tablet   metoprolol succinate (TOPROL-XL) 50 MG 24 hr tablet   Other Relevant Orders   CBC with Differential/Platelet   Comprehensive metabolic panel   Atrial fibrillation (HCC)    Chronic, ongoing.  Rate controlled.  Continue collaboration with cardiology and current medication regimen as ordered by them.      Relevant Medications   nitroGLYCERIN (NITROSTAT) 0.4 MG SL tablet   metoprolol  succinate (TOPROL-XL) 50 MG 24 hr tablet   Other Relevant Orders   CBC with Differential/Platelet   Comprehensive metabolic panel   Calcification of abdominal aorta ([place])    Noted on CT February 2018, continue statin therapy as ordered.      Relevant Medications   nitroGLYCERIN (NITROSTAT) 0.4 MG SL tablet   metoprolol succinate (TOPROL-XL) 50 MG 24 hr tablet   Other Relevant Orders   Comprehensive metabolic panel   Hypertension associated with diabetes ([place])    Chronic, ongoing wit BP at goal for age today.  Recommend he monitor BP at least a few mornings a week at home and document.  DASH diet at home.  Continue current medication regimen and adjust as needed.  Labs today: CMP, CBC, urine ALB.  Return in 3 months.       Relevant Medications   dapagliflozin propanediol (FARXIGA) 10 MG TABS tablet   nitroGLYCERIN (NITROSTAT) 0.4 MG SL tablet   metoprolol succinate (TOPROL-XL) 50 MG 24 hr tablet   Other Relevant Orders   HgB A1c   Urine Microalbumin w/creat. ratio   CBC with Differential/Platelet   TSH     Respiratory   COPD (chronic obstructive pulmonary disease) with emphysema (HCC)    Chronic, ongoing with recent spirometry stable.  Levels >80%.  Continue current inhaler regimen and adjust as needed.      Relevant Orders   CBC with Differential/Platelet   OSA on CPAP    Chronic, no use of CPAP.  Recommend he use 100% of the time.      Relevant Orders   CBC with Differential/Platelet     Digestive   GERD (gastroesophageal reflux disease)    Chronic, ongoing.  Continue current medication regimen and adjust as needed.  Mag level today.  Risks of PPI use were discussed with patient including bone loss, C. Diff diarrhea, pneumonia, infections, CKD, electrolyte abnormalities.  Verbalizes understanding and chooses to continue the medication.       Relevant Medications   pantoprazole (PROTONIX) 40 MG tablet   Other Relevant Orders   Magnesium     Endocrine    Hyperlipidemia associated with type 2 diabetes mellitus (HCC)    Chronic, ongoing.  Continue current medication regimen and  adjust as needed.  Lipid panel today.         Relevant Medications   dapagliflozin propanediol (FARXIGA) 10 MG TABS tablet   nitroGLYCERIN (NITROSTAT) 0.4 MG SL tablet   metoprolol succinate (TOPROL-XL) 50 MG 24 hr tablet   Other Relevant Orders   HgB A1c   Lipid Panel w/o Chol/HDL Ratio   Comprehensive metabolic panel   Type 2 diabetes mellitus with morbid obesity (HCC) - Primary    Chronic, ongoing with last A1c 7.2%.  Recheck today and adjust regimen as needed.  Check urine ALB today, continue Ramipril for kidney protection.  Check BS 2-3 times a day.  Recommend heavy focus on diabetic diet and maintain current regimen at this time.  Return in 3 months.      Relevant Medications   dapagliflozin propanediol (FARXIGA) 10 MG TABS tablet   Other Relevant Orders   HgB A1c   Urine Microalbumin w/creat. ratio     Genitourinary   Stage 3a chronic kidney disease (CKD) (HCC)    Chronic, ongoing.  At this time continue Ramipril for kidney protection.  Urine Alb and CMP on labs today.        Other   B12 deficiency    Noted on past labs, check today and start supplement as needed.      Relevant Orders   Vitamin B12   Elevated TSH    Noted on past labs, denies symptom.  Recheck labs today.      Relevant Orders   TSH   Thyroid peroxidase antibody   T4, free   Iron deficiency anemia due to chronic blood loss    Chronic, stable.  Continue current supplements and return to hematology as needed.      Morbid (severe) obesity due to excess calories (HCC)    BMI 36.26 with T2DM, HTN/HLD.  Recommended eating smaller high protein, low fat meals more frequently and exercising 30 mins a day 5 times a week with a goal of 10-15lb weight loss in the next 3 months. Patient voiced their understanding and motivation to adhere to these recommendations.       Relevant  Medications   dapagliflozin propanediol (FARXIGA) 10 MG TABS tablet   Restless legs    Chronic, ongoing.  Continue Gabapentin at HS, which offers benefit.  Check B12 level today and start supplement as needed.      Relevant Orders   Comprehensive metabolic panel   Other Visit Diagnoses     Vitamin D deficiency       Noted on past levels, recheck today and start supplement as needed.   Relevant Orders   VITAMIN D 25 Hydroxy (Vit-D Deficiency, Fractures)   Need for Td vaccine       Td vaccine provided today, educated patient.   Relevant Orders   Td vaccine greater than or equal to 7yo preservative free IM (Completed)        Follow up plan: Return in about 3 months (around 04/24/2023) for T2DM, HTN/HLD, A-FIB, COPD.

## 2023-01-25 ENCOUNTER — Encounter: Payer: Self-pay | Admitting: Nurse Practitioner

## 2023-01-25 ENCOUNTER — Other Ambulatory Visit: Payer: Self-pay | Admitting: Nurse Practitioner

## 2023-01-25 DIAGNOSIS — E559 Vitamin D deficiency, unspecified: Secondary | ICD-10-CM | POA: Insufficient documentation

## 2023-01-25 MED ORDER — CHOLECALCIFEROL 1.25 MG (50000 UT) PO TABS: 1.0000 | ORAL_TABLET | ORAL | 4 refills | Status: DC

## 2023-01-25 NOTE — Progress Notes (Signed)
Error

## 2023-01-25 NOTE — Progress Notes (Signed)
Good afternoon, please let Ernest [name] know his labs have returned: - A1c has trended up quite a bit from 7.2% to now 9.2%.  As we discussed yesterday I would like to start him on a medication that he injections into belly every week, a GLP1 like Ozempic or Trulicity.  Do you have any family history of thyroid cancer or personal history of pancreatitis?  If not then we can start one of these medications as we discussed which may help lower your sugars and get A1c under 7%.  Are you okay with adding this?  Let me know.  If so we need to schedule a follow-up for 4 weeks for him. - CBC is stable with no anemia or infection. - Cholesterol levels are at goal, continue current medications. - Kidney and liver function remain stable. - Vitamin D level is very low, I am going to send in a weekly Vitamin D supplement for him to start taking for bone health.  B12 level is also on lower side of normal.  I recommend he start to take over the counter Vitamin B12 1000 MCG daily for nervous system health. - Remainder of labs stable.  Any questions? Keep being awesome!!  Thank you for allowing me to participate in your care.  I appreciate you. Kindest regards, Bernardine Langworthy

## 2023-01-26 LAB — CBC WITH DIFFERENTIAL/PLATELET
Basophils Absolute: 0 10*3/uL (ref 0.0–0.2)
Basos: 1 %
EOS (ABSOLUTE): 0.1 10*3/uL (ref 0.0–0.4)
Eos: 3 %
Hematocrit: 43.2 % (ref 37.5–51.0)
Hemoglobin: 13.9 g/dL (ref 13.0–17.7)
Immature Grans (Abs): 0 10*3/uL (ref 0.0–0.1)
Immature Granulocytes: 0 %
Lymphocytes Absolute: 2 10*3/uL (ref 0.7–3.1)
Lymphs: 39 %
MCH: 26.2 pg — ABNORMAL LOW (ref 26.6–33.0)
MCHC: 32.2 g/dL (ref 31.5–35.7)
MCV: 81 fL (ref 79–97)
Monocytes Absolute: 0.3 10*3/uL (ref 0.1–0.9)
Monocytes: 6 %
Neutrophils Absolute: 2.6 10*3/uL (ref 1.4–7.0)
Neutrophils: 51 %
Platelets: 166 10*3/uL (ref 150–450)
RBC: 5.31 x10E6/uL (ref 4.14–5.80)
RDW: 13.9 % (ref 11.6–15.4)
WBC: 5.1 10*3/uL (ref 3.4–10.8)

## 2023-01-26 LAB — LIPID PANEL W/O CHOL/HDL RATIO
Cholesterol, Total: 83 mg/dL — ABNORMAL LOW (ref 100–199)
HDL: 31 mg/dL — ABNORMAL LOW (ref >39)
LDL Chol Calc (NIH): 27 mg/dL (ref 0–99)
Triglycerides: 144 mg/dL (ref 0–149)
VLDL Cholesterol Cal: 25 mg/dL (ref 5–40)

## 2023-01-26 LAB — MICROALBUMIN / CREATININE URINE RATIO
Creatinine, Urine: 80.6 mg/dL
Microalb/Creat Ratio: 36 mg/g creat — ABNORMAL HIGH (ref 0–29)
Microalbumin, Urine: 28.9 ug/mL

## 2023-01-26 LAB — COMPREHENSIVE METABOLIC PANEL
ALT: 25 IU/L (ref 0–44)
AST: 21 IU/L (ref 0–40)
Albumin/Globulin Ratio: 2.2 (ref 1.2–2.2)
Albumin: 4.6 g/dL (ref 3.8–4.8)
Alkaline Phosphatase: 103 IU/L (ref 44–121)
BUN/Creatinine Ratio: 9 — ABNORMAL LOW (ref 10–24)
BUN: 11 mg/dL (ref 8–27)
Bilirubin Total: 1.7 mg/dL — ABNORMAL HIGH (ref 0.0–1.2)
CO2: 24 mmol/L (ref 20–29)
Calcium: 9.3 mg/dL (ref 8.6–10.2)
Chloride: 102 mmol/L (ref 96–106)
Creatinine, Ser: 1.22 mg/dL (ref 0.76–1.27)
Globulin, Total: 2.1 g/dL (ref 1.5–4.5)
Glucose: 195 mg/dL — ABNORMAL HIGH (ref 70–99)
Potassium: 3.9 mmol/L (ref 3.5–5.2)
Sodium: 142 mmol/L (ref 134–144)
Total Protein: 6.7 g/dL (ref 6.0–8.5)
eGFR: 63 mL/min/{1.73_m2} (ref >59)

## 2023-01-26 LAB — T4, FREE: Free T4: 1.37 ng/dL (ref 0.82–1.77)

## 2023-01-26 LAB — TSH: TSH: 3.72 u[IU]/mL (ref 0.450–4.500)

## 2023-01-26 LAB — VITAMIN D 25 HYDROXY (VIT D DEFICIENCY, FRACTURES): Vit D, 25-Hydroxy: 6.7 ng/mL — ABNORMAL LOW (ref 30.0–100.0)

## 2023-01-26 LAB — HEMOGLOBIN A1C
Est. average glucose Bld gHb Est-mCnc: 217 mg/dL
Hgb A1c MFr Bld: 9.2 % — ABNORMAL HIGH (ref 4.8–5.6)

## 2023-01-26 LAB — THYROID PEROXIDASE ANTIBODY: Thyroperoxidase Ab SerPl-aCnc: <9 IU/mL (ref 0–34)

## 2023-01-26 LAB — MAGNESIUM: Magnesium: 1.7 mg/dL (ref 1.6–2.3)

## 2023-01-26 LAB — VITAMIN B12: Vitamin B-12: 322 pg/mL (ref 232–1245)

## 2023-01-29 ENCOUNTER — Other Ambulatory Visit: Payer: Self-pay | Admitting: Nurse Practitioner

## 2023-01-29 MED ORDER — TRULICITY 0.75 MG/0.5ML ~~LOC~~ SOAJ: 0.7500 mg | SUBCUTANEOUS | 1 refills | Status: DC

## 2023-02-01 ENCOUNTER — Telehealth: Payer: Self-pay

## 2023-02-01 ENCOUNTER — Telehealth: Payer: Self-pay | Admitting: Nurse Practitioner

## 2023-02-01 NOTE — Progress Notes (Cosign Needed)
Spoke with patient in reference to patient assistance application for [place]. Patient stated that he did receive applications in the mail and plan to take to [facility] location today to be signed/dated by provider and to be faxed to [employer].  [name]

## 2023-02-01 NOTE — Telephone Encounter (Signed)
Patient dropped of a patient assistance application and financial  information. Paperwork was placed in the providers folders for review

## 2023-02-05 ENCOUNTER — Telehealth: Payer: Self-pay

## 2023-02-05 NOTE — Telephone Encounter (Signed)
Paperwork placed in Pharmacy folder

## 2023-02-08 ENCOUNTER — Encounter: Payer: Self-pay | Admitting: *Deleted

## 2023-02-08 ENCOUNTER — Telehealth: Payer: Self-pay | Admitting: *Deleted

## 2023-02-08 NOTE — Patient Instructions (Signed)
Visit Information  Thank you for taking time to visit with me today. Please don't hesitate to contact me if I can be of assistance to you before our next scheduled telephone appointment.  Following are the goals we discussed today:  Look for glucose meter.  Monitor blood sugars at least daily. Stay away from [place] and ice cream.  Our next appointment is by telephone on 3/28  Please call the care guide team at [phone] if you need to cancel or reschedule your appointment.   Please call the Suicide and Crisis Lifeline: 988 call the [place] National Suicide Prevention Lifeline: 850-161-0766 or TTY: 3146267324 TTY 234-315-6229) to talk to a trained counselor call 1-800-273-TALK (toll free, 24 hour hotline) call 911 if you are experiencing a Mental Health or [place] or need someone to talk to.  The patient verbalized understanding of instructions, educational materials, and care plan provided today and agreed to receive a mailed copy of patient instructions, educational materials, and care plan.   The patient has been provided with contact information for the care management team and has been advised to call with any health related questions or concerns.   [facility] Care Management Coordinator [phone]

## 2023-02-08 NOTE — Patient Outreach (Signed)
  Care Coordination   Initial Visit Note   02/08/2023 Name: Ernest [name] MRN: [id number] DOB: June 09, 1951  Ernest [name] is a 72 y.o. year old male who sees Marnee Guarneri T, NP for primary care. I spoke with  Ernest [name] by phone today.  What matters to the patients health and wellness today?  Bringing A1C back down to acceptable reading.     Goals Addressed             This Visit's Progress    Effective management of DM       Care Coordination Interventions: Provided education to patient about basic DM disease process Reviewed medications with patient and discussed importance of medication adherence Discussed plans with patient for ongoing care management follow up and provided patient with direct contact information for care management team Reviewed scheduled/upcoming provider appointments including: PCP visit on 3/13 Advised patient, providing education and rationale, to check cbg daily and record, calling PCP for findings outside established parameters Screening for signs and symptoms of depression related to chronic disease state  Assessed social determinant of health barriers Discussed current A1C of 9.2, aware of goal of less than 7 Educated on importance of finding meter Diet discussed, including staying away from [place] drinks and ice cream        Interventions Today    Flowsheet Row Most Recent Value  Chronic Disease   Chronic disease during today's visit Diabetes  General Interventions   General Interventions Discussed/Reviewed General Interventions Discussed, Annual Eye Exam, Annual Foot Exam  Exercise Interventions   Exercise Discussed/Reviewed Exercise Discussed  Education Interventions   Education Provided Provided Printed Education  Nutrition Interventions   Nutrition Discussed/Reviewed Nutrition Discussed, Adding fruits and vegetables, Decreasing sugar intake       SDOH assessments and interventions completed:   Yes  SDOH Interventions Today    Flowsheet Row Most Recent Value  SDOH Interventions   Food Insecurity Interventions Intervention Not Indicated  Housing Interventions Intervention Not Indicated  Transportation Interventions Intervention Not Indicated        Care Coordination Interventions:  Yes, provided   Follow up plan: Follow up call scheduled for 3/28    Encounter Outcome:  Pt. Visit Completed   Ernest David, RN, MSN, [place] Care Management Care Management Coordinator [phone]

## 2023-02-09 ENCOUNTER — Telehealth: Payer: Self-pay

## 2023-02-09 NOTE — Telephone Encounter (Signed)
Pharmacy paperwork placed in Pharmacy folder for pick up

## 2023-02-12 ENCOUNTER — Telehealth: Payer: Self-pay

## 2023-02-12 NOTE — Progress Notes (Cosign Needed)
Care Management & Coordination Services Pharmacy Team  Reason for Encounter: Patient assistance renewal   Patient is currently enrolled in [place] patient assistance program for the medication Breztri. Start date was 01/08/23 until date: 12/18/23  [name]

## 2023-02-16 ENCOUNTER — Telehealth: Payer: Self-pay | Admitting: Nurse Practitioner

## 2023-02-16 NOTE — Telephone Encounter (Signed)
Contacted [name] to schedule their annual wellness visit. Appointment made for 03/12/2023.  Sherol Dade; Care Guide [facility] Direct Dial: [phone]

## 2023-02-19 ENCOUNTER — Telehealth: Payer: Self-pay

## 2023-02-19 NOTE — Progress Notes (Cosign Needed)
Care Management & Coordination Services Pharmacy Team  Reason for Encounter: Patient assistance renewal   Patient is currently enrolled in [name] patient assistance program for the medication Farxiga. Start 02/13/23 until date: 12/18/23.  Ernest [name]

## 2023-02-25 NOTE — Patient Instructions (Signed)

## 2023-02-28 ENCOUNTER — Ambulatory Visit (INDEPENDENT_AMBULATORY_CARE_PROVIDER_SITE_OTHER): Payer: Medicare HMO | Admitting: Nurse Practitioner

## 2023-02-28 ENCOUNTER — Telehealth: Payer: Self-pay | Admitting: Cardiovascular Disease

## 2023-02-28 ENCOUNTER — Encounter: Payer: Self-pay | Admitting: Nurse Practitioner

## 2023-02-28 VITALS — BP 125/77 | HR 66 | Temp 97.7°F | Ht 70.0 in | Wt 246.4 lb

## 2023-02-28 DIAGNOSIS — E1169 Type 2 diabetes mellitus with other specified complication: Secondary | ICD-10-CM

## 2023-02-28 DIAGNOSIS — I129 Hypertensive chronic kidney disease with stage 1 through stage 4 chronic kidney disease, or unspecified chronic kidney disease: Secondary | ICD-10-CM | POA: Diagnosis not present

## 2023-02-28 DIAGNOSIS — R0789 Other chest pain: Secondary | ICD-10-CM | POA: Diagnosis not present

## 2023-02-28 DIAGNOSIS — R059 Cough, unspecified: Secondary | ICD-10-CM | POA: Diagnosis not present

## 2023-02-28 DIAGNOSIS — I4819 Other persistent atrial fibrillation: Secondary | ICD-10-CM | POA: Diagnosis not present

## 2023-02-28 DIAGNOSIS — R079 Chest pain, unspecified: Secondary | ICD-10-CM | POA: Diagnosis not present

## 2023-02-28 DIAGNOSIS — I4811 Longstanding persistent atrial fibrillation: Secondary | ICD-10-CM | POA: Diagnosis not present

## 2023-02-28 DIAGNOSIS — H8190 Unspecified disorder of vestibular function, unspecified ear: Secondary | ICD-10-CM | POA: Diagnosis not present

## 2023-02-28 DIAGNOSIS — I200 Unstable angina: Secondary | ICD-10-CM | POA: Diagnosis not present

## 2023-02-28 DIAGNOSIS — N1831 Chronic kidney disease, stage 3a: Secondary | ICD-10-CM | POA: Diagnosis not present

## 2023-02-28 DIAGNOSIS — I517 Cardiomegaly: Secondary | ICD-10-CM | POA: Diagnosis not present

## 2023-02-28 DIAGNOSIS — J811 Chronic pulmonary edema: Secondary | ICD-10-CM | POA: Diagnosis not present

## 2023-02-28 DIAGNOSIS — Z87891 Personal history of nicotine dependence: Secondary | ICD-10-CM | POA: Diagnosis not present

## 2023-02-28 DIAGNOSIS — I25110 Atherosclerotic heart disease of native coronary artery with unstable angina pectoris: Secondary | ICD-10-CM | POA: Diagnosis not present

## 2023-02-28 NOTE — Progress Notes (Signed)
BP 125/77   Pulse 66   Temp 97.7 F (36.5 C) (Oral)   Ht '5\' 10"'$  (1.778 m)   Wt 246 lb 6.4 oz (111.8 kg)   SpO2 95%   BMI 35.35 kg/m    Subjective:    Patient ID: Ernest [name], male    DOB: 08-16-1951, 72 y.o.   MRN: [id number]  HPI: Ernest [name] is a 72 y.o. male  Chief Complaint  Patient presents with   Diabetes   Chest Pain    Dull achy pain since last night   DIABETES A1c 9.2% February.  Started Trulicity and he is tolerating, just does not like feeling medicine go in.  Denies any ADR with this.  Continues on Farxiga and Metformin.  Has lost 6 pounds with Trulicity, does notice appetite reduced. Hypoglycemic episodes:no Polydipsia/polyuria: no Visual disturbance: no Chest pain: yes Paresthesias: no Glucose Monitoring: yes  Accucheck frequency:  occasional  Fasting glucose:  Post prandial:  Evening: 127 on recent check  Before meals: Taking Insulin?: no  Long acting insulin:  Short acting insulin: Blood Pressure Monitoring: not checking Retinal Examination: Up to Date Foot Exam: Up to Date Pneumovax: Up to Date Influenza: Up to Date Aspirin: no   ATRIAL FIBRILLATION Follows with cardiology (last visit on 01/09/23) -- continues on Xarelto and Metoprolol.  Is on Imdur and NTG for angina.  He does endorse chest pain that start last night, was laying in bed.  Reports this as a dull pain that is not slowing him down, but is not going away.  At present it is not bad, but does get stronger.  Substernal in nature with feeling like someone pushing on chest.  Denies diaphoresis, SOB, radiation of pain.   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 Atrial fibrillation status: exacerbated Satisfied with current treatment: yes  Medication side effects:  no Medication compliance: good compliance Etiology of atrial fibrillation: unknown Palpitations:  no Chest pain:  yes Dyspnea on exertion:  sometimes Orthopnea:   no Syncope: yesterday had episode Edema:  no Ventricular rate control: B-blocker Anti-coagulation: long acting   Relevant past medical, surgical, family and social history reviewed and updated as indicated. Interim medical history since our last visit reviewed. Allergies and medications reviewed and updated.  Review of Systems  Constitutional:  Negative for activity change, diaphoresis, fatigue and fever.  Respiratory:  Negative for cough, chest tightness, shortness of breath and wheezing.   Cardiovascular:  Positive for chest pain. Negative for palpitations and leg swelling.  Gastrointestinal: Negative.   Endocrine: Negative for cold intolerance, heat intolerance, polydipsia, polyphagia and polyuria.  Neurological: Negative.   Psychiatric/Behavioral: Negative.      Per HPI unless specifically indicated above     Objective:    BP 125/77   Pulse 66   Temp 97.7 F (36.5 C) (Oral)   Ht '5\' 10"'$  (1.778 m)   Wt 246 lb 6.4 oz (111.8 kg)   SpO2 95%   BMI 35.35 kg/m   Wt Readings from Last 3 Encounters:  02/28/23 246 lb 6.4 oz (111.8 kg)  01/24/23 252 lb 11.2 oz (114.6 kg)  01/09/23 255 lb 4 oz (115.8 kg)    Physical Exam Vitals and nursing note reviewed.  Constitutional:      General: He is awake. He is not in acute distress.    Appearance: He is well-developed and well-groomed. He is obese. He is not ill-appearing or  toxic-appearing.     Comments: Overall very talkative and no acute distress.  HENT:     Head: Normocephalic.     Right Ear: Hearing and external ear normal.     Left Ear: Hearing and external ear normal.  Eyes:     General: Lids are normal.     Extraocular Movements: Extraocular movements intact.     Conjunctiva/sclera: Conjunctivae normal.  Neck:     Thyroid: No thyromegaly.     Vascular: No carotid bruit.  Cardiovascular:     Rate and Rhythm: Normal rate. Rhythm irregularly irregular.     Heart sounds: Normal heart sounds.  Pulmonary:     Effort:  Pulmonary effort is normal. No accessory muscle usage or respiratory distress.     Breath sounds: Normal breath sounds.  Abdominal:     General: Bowel sounds are normal. There is no distension.     Palpations: Abdomen is soft.     Tenderness: There is no abdominal tenderness.  Musculoskeletal:     Cervical back: Full passive range of motion without pain.     Right lower leg: No edema.     Left lower leg: No edema.  Lymphadenopathy:     Cervical: No cervical adenopathy.  Skin:    General: Skin is warm.     Capillary Refill: Capillary refill takes less than 2 seconds.  Neurological:     Mental Status: He is alert and oriented to person, place, and time.     Deep Tendon Reflexes: Reflexes are normal and symmetric.     Reflex Scores:      Brachioradialis reflexes are 2+ on the right side and 2+ on the left side.      Patellar reflexes are 2+ on the right side and 2+ on the left side. Psychiatric:        Attention and Perception: Attention normal.        Mood and Affect: Mood normal.        Speech: Speech normal.        Behavior: Behavior normal. Behavior is cooperative.        Thought Content: Thought content normal.    EKG My review and personal interpretation at Time: 1000  Indication: chest pain Rate: 70  Rhythm: a fib Axis: normal Other: no nonspecific st abn, no stemi, no lvh   Results for orders placed or performed in visit on 01/24/23  HgB A1c  Result Value Ref Range   Hgb A1c MFr Bld 9.2 (H) 4.8 - 5.6 %   Est. average glucose Bld gHb Est-mCnc 217 mg/dL  Urine Microalbumin w/creat. ratio  Result Value Ref Range   Creatinine, Urine 80.6 Not Estab. mg/dL   Microalbumin, Urine 28.9 Not Estab. ug/mL   Microalb/Creat Ratio 36 (H) 0 - 29 mg/g creat  CBC with Differential/Platelet  Result Value Ref Range   WBC 5.1 3.4 - 10.8 x10E3/uL   RBC 5.31 4.14 - 5.80 x10E6/uL   Hemoglobin 13.9 13.0 - 17.7 g/dL   Hematocrit 43.2 37.5 - 51.0 %   MCV 81 79 - 97 fL   MCH 26.2 (L) 26.6  - 33.0 pg   MCHC 32.2 31.5 - 35.7 g/dL   RDW 13.9 11.6 - 15.4 %   Platelets 166 150 - 450 x10E3/uL   Neutrophils 51 Not Estab. %   Lymphs 39 Not Estab. %   Monocytes 6 Not Estab. %   Eos 3 Not Estab. %   Basos 1 Not Estab. %  Neutrophils Absolute 2.6 1.4 - 7.0 x10E3/uL   Lymphocytes Absolute 2.0 0.7 - 3.1 x10E3/uL   Monocytes Absolute 0.3 0.1 - 0.9 x10E3/uL   EOS (ABSOLUTE) 0.1 0.0 - 0.4 x10E3/uL   Basophils Absolute 0.0 0.0 - 0.2 x10E3/uL   Immature Granulocytes 0 Not Estab. %   Immature Grans (Abs) 0.0 0.0 - 0.1 x10E3/uL  Lipid Panel w/o Chol/HDL Ratio  Result Value Ref Range   Cholesterol, Total 83 (L) 100 - 199 mg/dL   Triglycerides 144 0 - 149 mg/dL   HDL 31 (L) >39 mg/dL   VLDL Cholesterol Cal 25 5 - 40 mg/dL   LDL Chol Calc (NIH) 27 0 - 99 mg/dL  Comprehensive metabolic panel  Result Value Ref Range   Glucose 195 (H) 70 - 99 mg/dL   BUN 11 8 - 27 mg/dL   Creatinine, Ser 1.22 0.76 - 1.27 mg/dL   eGFR 63 >59 mL/min/1.73   BUN/Creatinine Ratio 9 (L) 10 - 24   Sodium 142 134 - 144 mmol/L   Potassium 3.9 3.5 - 5.2 mmol/L   Chloride 102 96 - 106 mmol/L   CO2 24 20 - 29 mmol/L   Calcium 9.3 8.6 - 10.2 mg/dL   Total Protein 6.7 6.0 - 8.5 g/dL   Albumin 4.6 3.8 - 4.8 g/dL   Globulin, Total 2.1 1.5 - 4.5 g/dL   Albumin/Globulin Ratio 2.2 1.2 - 2.2   Bilirubin Total 1.7 (H) 0.0 - 1.2 mg/dL   Alkaline Phosphatase 103 44 - 121 IU/L   AST 21 0 - 40 IU/L   ALT 25 0 - 44 IU/L  TSH  Result Value Ref Range   TSH 3.720 0.450 - 4.500 uIU/mL  VITAMIN D 25 Hydroxy (Vit-D Deficiency, Fractures)  Result Value Ref Range   Vit D, 25-Hydroxy 6.7 (L) 30.0 - 100.0 ng/mL  Vitamin B12  Result Value Ref Range   Vitamin B-12 322 232 - 1,245 pg/mL  Magnesium  Result Value Ref Range   Magnesium 1.7 1.6 - 2.3 mg/dL  Thyroid peroxidase antibody  Result Value Ref Range   Thyroperoxidase Ab SerPl-aCnc <9 0 - 34 IU/mL  T4, free  Result Value Ref Range   Free T4 1.37 0.82 - 1.77 ng/dL       Assessment & Plan:   Problem List Items Addressed This Visit       Cardiovascular and Mediastinum   Atrial fibrillation (Blue) - Primary    Chronic, ongoing.  Rate controlled.  Continue collaboration with cardiology and current medication regimen as ordered by them.  Currently having CP and a-fib present, reached out to cardiology and they recommend ER visit.  Alerted patient.        Endocrine   Type 2 diabetes mellitus with morbid obesity (HCC)    Chronic, ongoing.  A1c February 9.2% and added on Trulicity which he is tolerating without ADR.  Recheck today and adjust regimen as needed.  Check urine ALB 30 February 2024, continue Ramipril for kidney protection.  Check BS 2-3 times a day, he is noticing a down trend in sugar levels.  Recommend heavy focus on diabetic diet and maintain current regimen at this time.  Return in 3 months. - ACE and statin on board - Eye and foot exam up to date - Vaccines up to date        Other   Chest pain    Ongoing since last night.  EKG does show a-fib, known.  Has not taken NTG at home.  No acute distress on exam today.  Reached out to cardiology who recommends he attend ER for further work-up due to ongoing nature of pain.  Alerted patient.      Relevant Orders   EKG 12-Lead (Completed)     Follow up plan: Return in about 8 weeks (around 04/24/2023) for T2DM, HTN/HLD, CAD, A-FIB, CKD, GERD.

## 2023-02-28 NOTE — Assessment & Plan Note (Signed)
Chronic, ongoing.  A1c February 9.2% and added on Trulicity which he is tolerating without ADR.  Recheck today and adjust regimen as needed.  Check urine ALB 30 February 2024, continue Ramipril for kidney protection.  Check BS 2-3 times a day, he is noticing a down trend in sugar levels.  Recommend heavy focus on diabetic diet and maintain current regimen at this time.  Return in 3 months. - ACE and statin on board - Eye and foot exam up to date - Vaccines up to date

## 2023-02-28 NOTE — Assessment & Plan Note (Signed)
Chronic, ongoing.  Rate controlled.  Continue collaboration with cardiology and current medication regimen as ordered by them.  Currently having CP and a-fib present, reached out to cardiology and they recommend ER visit.  Alerted patient.

## 2023-02-28 NOTE — Telephone Encounter (Signed)
Patient c/o Palpitations:  High priority if patient c/o lightheadedness, shortness of breath, or chest pain  How long have you had palpitations/irregular HR/ Afib? Are you having the symptoms now?  Patient showed up at their office in afib, stating he had been in afib since last night and is still in afib  Are you currently experiencing lightheadedness, SOB or CP?  CP   Do you have a history of afib (atrial fibrillation) or irregular heart rhythm?  Yes   Have you checked your BP or HR? (document readings if available):  3/13: 125/77 66  Are you experiencing any other symptoms?

## 2023-02-28 NOTE — Telephone Encounter (Signed)
Spoke with Tammy from [facility] and she stated that the patient complained of chest pain the evening of 02/27/23 but was too tired to take a nitro tablet. Patient seen in [facility] today 02/28/23 still complaining of chest pain. EKG performed and patient in active Afib. Patient wanted appointment to be be seen in office today. No availability for appointments patient was instructed to present to nearest ED as he is in Afib with active chest pain.

## 2023-02-28 NOTE — Assessment & Plan Note (Signed)
Ongoing since last night.  EKG does show a-fib, known.  Has not taken NTG at home.  No acute distress on exam today.  Reached out to cardiology who recommends he attend ER for further work-up due to ongoing nature of pain.  Alerted patient.

## 2023-03-01 DIAGNOSIS — I130 Hypertensive heart and chronic kidney disease with heart failure and stage 1 through stage 4 chronic kidney disease, or unspecified chronic kidney disease: Secondary | ICD-10-CM | POA: Diagnosis not present

## 2023-03-01 DIAGNOSIS — I4819 Other persistent atrial fibrillation: Secondary | ICD-10-CM | POA: Diagnosis not present

## 2023-03-01 DIAGNOSIS — Z7984 Long term (current) use of oral hypoglycemic drugs: Secondary | ICD-10-CM | POA: Diagnosis not present

## 2023-03-01 DIAGNOSIS — I129 Hypertensive chronic kidney disease with stage 1 through stage 4 chronic kidney disease, or unspecified chronic kidney disease: Secondary | ICD-10-CM | POA: Diagnosis not present

## 2023-03-01 DIAGNOSIS — N400 Enlarged prostate without lower urinary tract symptoms: Secondary | ICD-10-CM | POA: Diagnosis not present

## 2023-03-01 DIAGNOSIS — Z87891 Personal history of nicotine dependence: Secondary | ICD-10-CM | POA: Diagnosis not present

## 2023-03-01 DIAGNOSIS — G4733 Obstructive sleep apnea (adult) (pediatric): Secondary | ICD-10-CM | POA: Diagnosis not present

## 2023-03-01 DIAGNOSIS — Z7985 Long-term (current) use of injectable non-insulin antidiabetic drugs: Secondary | ICD-10-CM | POA: Diagnosis not present

## 2023-03-01 DIAGNOSIS — E1122 Type 2 diabetes mellitus with diabetic chronic kidney disease: Secondary | ICD-10-CM | POA: Diagnosis not present

## 2023-03-01 DIAGNOSIS — K219 Gastro-esophageal reflux disease without esophagitis: Secondary | ICD-10-CM | POA: Diagnosis not present

## 2023-03-01 DIAGNOSIS — R54 Age-related physical debility: Secondary | ICD-10-CM | POA: Diagnosis not present

## 2023-03-01 DIAGNOSIS — E7849 Other hyperlipidemia: Secondary | ICD-10-CM | POA: Diagnosis not present

## 2023-03-01 DIAGNOSIS — I252 Old myocardial infarction: Secondary | ICD-10-CM | POA: Diagnosis not present

## 2023-03-01 DIAGNOSIS — Z7901 Long term (current) use of anticoagulants: Secondary | ICD-10-CM | POA: Diagnosis not present

## 2023-03-01 DIAGNOSIS — R0789 Other chest pain: Secondary | ICD-10-CM | POA: Diagnosis not present

## 2023-03-01 DIAGNOSIS — Z955 Presence of coronary angioplasty implant and graft: Secondary | ICD-10-CM | POA: Diagnosis not present

## 2023-03-01 DIAGNOSIS — Z6834 Body mass index (BMI) 34.0-34.9, adult: Secondary | ICD-10-CM | POA: Diagnosis not present

## 2023-03-01 DIAGNOSIS — G2581 Restless legs syndrome: Secondary | ICD-10-CM | POA: Diagnosis not present

## 2023-03-01 DIAGNOSIS — I25110 Atherosclerotic heart disease of native coronary artery with unstable angina pectoris: Secondary | ICD-10-CM | POA: Diagnosis not present

## 2023-03-01 DIAGNOSIS — Z7982 Long term (current) use of aspirin: Secondary | ICD-10-CM | POA: Diagnosis not present

## 2023-03-01 DIAGNOSIS — I25111 Atherosclerotic heart disease of native coronary artery with angina pectoris with documented spasm: Secondary | ICD-10-CM | POA: Diagnosis not present

## 2023-03-01 DIAGNOSIS — D509 Iron deficiency anemia, unspecified: Secondary | ICD-10-CM | POA: Diagnosis not present

## 2023-03-01 DIAGNOSIS — I5031 Acute diastolic (congestive) heart failure: Secondary | ICD-10-CM | POA: Diagnosis not present

## 2023-03-01 DIAGNOSIS — R059 Cough, unspecified: Secondary | ICD-10-CM | POA: Diagnosis not present

## 2023-03-01 DIAGNOSIS — I25112 Atherosclerotic heart disease of native coronary artery with refractory angina pectoris: Secondary | ICD-10-CM | POA: Diagnosis not present

## 2023-03-01 DIAGNOSIS — I200 Unstable angina: Secondary | ICD-10-CM | POA: Diagnosis not present

## 2023-03-01 DIAGNOSIS — I3489 Other nonrheumatic mitral valve disorders: Secondary | ICD-10-CM | POA: Diagnosis not present

## 2023-03-01 DIAGNOSIS — J439 Emphysema, unspecified: Secondary | ICD-10-CM | POA: Diagnosis not present

## 2023-03-01 DIAGNOSIS — H8190 Unspecified disorder of vestibular function, unspecified ear: Secondary | ICD-10-CM | POA: Diagnosis not present

## 2023-03-01 DIAGNOSIS — N1831 Chronic kidney disease, stage 3a: Secondary | ICD-10-CM | POA: Diagnosis not present

## 2023-03-01 DIAGNOSIS — I700 Atherosclerosis of aorta: Secondary | ICD-10-CM | POA: Diagnosis not present

## 2023-03-02 DIAGNOSIS — I700 Atherosclerosis of aorta: Secondary | ICD-10-CM | POA: Diagnosis not present

## 2023-03-02 DIAGNOSIS — I4819 Other persistent atrial fibrillation: Secondary | ICD-10-CM | POA: Diagnosis not present

## 2023-03-02 DIAGNOSIS — I25110 Atherosclerotic heart disease of native coronary artery with unstable angina pectoris: Secondary | ICD-10-CM | POA: Diagnosis not present

## 2023-03-02 DIAGNOSIS — I3489 Other nonrheumatic mitral valve disorders: Secondary | ICD-10-CM | POA: Diagnosis not present

## 2023-03-05 ENCOUNTER — Telehealth: Payer: Self-pay

## 2023-03-05 NOTE — Transitions of Care (Post Inpatient/ED Visit) (Signed)
   03/05/2023  Name: Ernest [name] MRN: [id number] DOB: May 14, 1951  Today's TOC FU Call Status: Today's TOC FU Call Status:: Successful TOC FU Call Competed TOC FU Call Complete Date: 03/05/23  Transition Care Management Follow-up Telephone Call Date of Discharge: 03/02/23 Discharge Facility: Other ([facility]) Name of Other ([facility]) Discharge Facility: [facility] Type of Discharge: Inpatient Admission Primary Inpatient Discharge Diagnosis:: "unstable angina" How have you been since you were released from the hospital?: Better (Patient states he is doing better-he was outside yesterday and mowed lawn on [address]. Denies any chest pain or cardiac issues.) Any questions or concerns?: No  Items Reviewed: Did you receive and understand the discharge instructions provided?: No Medications obtained and verified?: No (patient declined-states he has all meds and knows them) Any new allergies since your discharge?: No Dietary orders reviewed?: Yes Type of Diet Ordered:: low salt/heart healthy Do you have support at home?: No  Home Care and Equipment/Supplies: [place] Ordered?: NA Any new equipment or medical supplies ordered?: NA  Functional Questionnaire: Do you need assistance with bathing/showering or dressing?: No Do you need assistance with meal preparation?: No Do you need assistance with eating?: No Do you have difficulty maintaining continence: No Do you need assistance with getting out of bed/getting out of a chair/moving?: No Do you have difficulty managing or taking your medications?: No  Follow up appointments reviewed: PCP Follow-up appointment confirmed?: NA (patient states he just saw PCP on 02/28/23 and only needs to follow up with cardiology) Specialist Hospital Follow-up appointment confirmed?: No Reason Specialist Follow-Up Not Confirmed: Patient has Specialist Provider Number and will Call for Appointment Do you need  transportation to your follow-up appointment?: No Do you understand care options if your condition(s) worsen?: Yes-patient verbalized understanding  SDOH Interventions Today    Flowsheet Row Most Recent Value  SDOH Interventions   Food Insecurity Interventions Intervention Not Indicated      TOC Interventions Today    Flowsheet Row Most Recent Value  TOC Interventions   TOC Interventions Discussed/Reviewed TOC Interventions Discussed, Post discharge activity limitations per provider      Interventions Today    Flowsheet Row Most Recent Value  Education Interventions   Education Provided Provided Education  [bowel regimen-pt reported no BM-will try some apple/prune jucie -states was taking OTC laxative/stool softener prn and will pick some up]  Provided Verbal Education On Nutrition, Medication, When to see the doctor  Nutrition Interventions   Nutrition Discussed/Reviewed Nutrition Discussed  Pharmacy Interventions   Pharmacy Dicussed/Reviewed Pharmacy Topics Discussed, Medications and their functions       [name] [facility] Care Management Care Management Community Coordinator Direct Phone: [phone] Toll Free: [phone] Fax: [phone]

## 2023-03-06 DIAGNOSIS — I200 Unstable angina: Secondary | ICD-10-CM | POA: Diagnosis not present

## 2023-03-07 ENCOUNTER — Telehealth: Payer: Self-pay

## 2023-03-07 NOTE — Progress Notes (Cosign Needed)
Spoke with patient this morning to schedule a follow up phone visit with the Clinical Pharmacist on 03/19/23.   Ernest [name]

## 2023-03-12 ENCOUNTER — Ambulatory Visit (INDEPENDENT_AMBULATORY_CARE_PROVIDER_SITE_OTHER): Payer: Medicare HMO

## 2023-03-12 VITALS — Ht 70.0 in | Wt 246.0 lb

## 2023-03-12 DIAGNOSIS — Z0000 Encounter for general adult medical examination without abnormal findings: Secondary | ICD-10-CM | POA: Diagnosis not present

## 2023-03-12 NOTE — Progress Notes (Signed)
I connected with  [name] on 03/12/23 by a audio enabled telemedicine application and verified that I am speaking with the correct person using two identifiers.  Patient Location: Home  Provider Location: Office/Clinic  I discussed the limitations of evaluation and management by telemedicine. The patient expressed understanding and agreed to proceed.  Subjective:   Ernest [name] is a 72 y.o. male who presents for Medicare Annual/Subsequent preventive examination.  Review of Systems     Cardiac Risk Factors include: advanced age (>57men, >72 women);diabetes mellitus;obesity (BMI >30kg/m2);male gender;hypertension     Objective:    There were no vitals filed for this visit. There is no height or weight on file to calculate BMI.     03/12/2023    3:38 PM 08/19/2021    2:21 PM 08/09/2021    2:38 PM 05/23/2021    8:57 AM 04/11/2021    1:28 PM 04/04/2021   10:52 AM 02/14/2021    8:17 AM  Advanced Directives  Does Patient Have a Medical Advance Directive? Yes No No No No No No  Type of Paramedic of [place];Living will        Does patient want to make changes to medical advance directive? No - Patient declined        Copy of [place] in Chart? Yes - validated most recent copy scanned in chart (See row information)        Would patient like information on creating a medical advance directive?  No - Patient declined Yes (MAU/Ambulatory/Procedural Areas - Information given)  No - Patient declined Yes (MAU/Ambulatory/Procedural Areas - Information given)     Current Medications (verified) Outpatient Encounter Medications as of 03/12/2023  Medication Sig   ACCU-CHEK GUIDE test strip    albuterol (VENTOLIN HFA) 108 (90 Base) MCG/ACT inhaler Inhale 2 puffs into the lungs every 4 (four) hours as needed for wheezing or shortness of breath. Uses 2-3 times daily   atorvastatin (LIPITOR) 80 MG tablet TAKE 1 TABLET(80 MG) BY MOUTH DAILY    Budeson-Glycopyrrol-Formoterol (BREZTRI AEROSPHERE) 160-9-4.8 MCG/ACT AERO Inhale 2 puffs into the lungs 2 (two) times daily.   Cholecalciferol 1.25 MG (50000 UT) TABS Take 1 tablet by mouth once a week.   clotrimazole-betamethasone (LOTRISONE) cream Apply 1 Application topically 2 (two) times daily.   dapagliflozin propanediol (FARXIGA) 10 MG TABS tablet Take 1 tablet (10 mg total) by mouth daily.   diltiazem (CARDIZEM CD) 180 MG 24 hr capsule Take 1 capsule (180 mg total) by mouth daily.   Dulaglutide (TRULICITY) A999333 0000000 SOPN Inject 0.75 mg into the skin once a week.   ezetimibe (ZETIA) 10 MG tablet Take 1 tablet (10 mg total) by mouth daily.   furosemide (LASIX) 20 MG tablet TAKE ONE TABLET BY MOUTH EVERY MORNING and TAKE ONE TABLET BY MOUTH EVERYDAY AT BEDTIME   gabapentin (NEURONTIN) 600 MG tablet Take 2 tablets (1,200 mg total) by mouth at bedtime.   isosorbide mononitrate (IMDUR) 60 MG 24 hr tablet TAKE 1 TABLET(60 MG) BY MOUTH DAILY   metFORMIN (GLUCOPHAGE-XR) 500 MG 24 hr tablet Take 1 tablet (500 mg total) by mouth in the morning and at bedtime. 1 tab bid   metoprolol succinate (TOPROL-XL) 50 MG 24 hr tablet TAKE 1 TABLET BY MOUTH ONCE DAILY WITH OR IMMEDIATELY FOLLOWING A MEAL   nitroGLYCERIN (NITROSTAT) 0.4 MG SL tablet Place 1 tablet (0.4 mg total) under the tongue every 5 (five) minutes as needed for chest pain.  pantoprazole (PROTONIX) 40 MG tablet TAKE 1 TABLET(40 MG) BY MOUTH DAILY   ramipril (ALTACE) 10 MG capsule Take 1 capsule (10 mg total) by mouth daily.   rivaroxaban (XARELTO) 20 MG TABS tablet TAKE ONE TABLET BY MOUTH EVERY EVENING   No facility-administered encounter medications on file as of 03/12/2023.    Allergies (verified) Doxycycline   History: Past Medical History:  Diagnosis Date   A-fib ([place])    Anemia    Asthma    COPD (chronic obstructive pulmonary disease) (HCC)    Diabetes mellitus    Type II   Early Pulmonary fibrosis (HCC)    GERD  (gastroesophageal reflux disease)    History of echocardiogram    a. 02/2017 Echo: EF 60-65%, no rwma, mild MR, mod dil LA. Nl RV fxn. PASP 52mmHg.   Hyperlipidemia    Hypertension    Morbid obesity ([place])    Myocardial infarction [facility])    "light"   Non-obstructive CAD (coronary artery disease)    a. 2011 Cath: nonobs dzs; b. 07/2014 Cath: LM 30, LAD nl, LCX nl, RCA 20p, 31m.   Permanent atrial fibrillation (HCC)    a. CHA2DS2VASc = 4-->xarelto.   Polyp of sigmoid colon    Past Surgical History:  Procedure Laterality Date   ANKLE SURGERY     CARDIAC CATHETERIZATION  05/2010   [facility]   CARDIAC CATHETERIZATION     [facility]   CARDIAC CATHETERIZATION     [facility]   CARDIAC CATHETERIZATION  07/2014   [place]   COLONOSCOPY     COLONOSCOPY WITH PROPOFOL N/A 06/26/2018   Procedure: COLONOSCOPY WITH PROPOFOL;  Surgeon: Virgel Manifold, MD;  Location: [facility] ENDOSCOPY;  Service: Endoscopy;  Laterality: N/A;   COLONOSCOPY WITH PROPOFOL N/A 05/23/2021   Procedure: COLONOSCOPY WITH PROPOFOL;  Surgeon: Virgel Manifold, MD;  Location: [facility] ENDOSCOPY;  Service: Endoscopy;  Laterality: N/A;   CORONARY ARTERY BYPASS GRAFT     CORONARY STENT INTERVENTION N/A 03/07/2019   Procedure: CORONARY STENT INTERVENTION;  Surgeon: Nelva Bush, MD;  Location: [place] [facility];  Service: Cardiovascular;  Laterality: N/A;   ENTEROSCOPY N/A 08/05/2018   Procedure: ENTEROSCOPY;  Surgeon: Virgel Manifold, MD;  Location: [facility] ENDOSCOPY;  Service: Endoscopy;  Laterality: N/A;   ESOPHAGOGASTRODUODENOSCOPY (EGD) WITH PROPOFOL N/A 06/26/2018   Procedure: ESOPHAGOGASTRODUODENOSCOPY (EGD) WITH PROPOFOL;  Surgeon: Virgel Manifold, MD;  Location: [facility] ENDOSCOPY;  Service: Endoscopy;  Laterality: N/A;   ESOPHAGOGASTRODUODENOSCOPY (EGD) WITH PROPOFOL N/A 05/23/2021   Procedure: PUSH ESOPHAGOGASTRODUODENOSCOPY (EGD) WITH PROPOFOL;  Surgeon: Virgel Manifold, MD;  Location: [facility] ENDOSCOPY;  Service: Endoscopy;  Laterality:  N/A;   LEFT HEART CATH AND CORONARY ANGIOGRAPHY N/A 03/07/2019   Procedure: LEFT HEART CATH AND CORONARY ANGIOGRAPHY;  Surgeon: Nelva Bush, MD;  Location: [place] [facility];  Service: Cardiovascular;  Laterality: N/A;   TESTICLE SURGERY  70's   TUMOR EXCISION     Neck and finger; benign   WRIST SURGERY     Family History  Problem Relation Age of Onset   Hypertension Mother    Hyperlipidemia Mother    Diabetes Mother    Heart disease Mother        CABG   Pneumonia Father        rare type   Diabetes Other    Depression Other    Coronary artery disease Other    Alcohol abuse Other    Hypertension Other    Hyperlipidemia Other    Cancer Brother  Kidney Stones Brother    Social History   Socioeconomic History   Marital status: Widowed    Spouse name: Not on file   Number of children: Not on file   Years of education: Not on file   Highest education level: 8th grade  Occupational History   Occupation: part time   Tobacco Use   Smoking status: Former    Packs/day: 4.00    Years: 28.00    Additional pack years: 0.00    Total pack years: 112.00    Types: Cigarettes    Quit date: 08/25/1993    Years since quitting: 29.5   Smokeless tobacco: Never  Vaping Use   Vaping Use: Never used  Substance and Sexual Activity   Alcohol use: Not Currently   Drug use: No   Sexual activity: Not Currently  Other Topics Concern   Not on file  Social History Narrative   No regular exercise.   Social Determinants of Health   Financial Resource Strain: Low Risk  (03/12/2023)   Overall Financial Resource Strain (CARDIA)    Difficulty of Paying Living Expenses: Not very hard  Recent Concern: Financial Resource Strain - High Risk (01/08/2023)   Overall Financial Resource Strain (CARDIA)    Difficulty of Paying Living Expenses: Very hard  Food Insecurity: No Food Insecurity (03/12/2023)   Hunger Vital Sign    Worried About Running Out of Food in the Last Year: Never true    Ran  Out of Food in the Last Year: Never true  Transportation Needs: No Transportation Needs (03/12/2023)   PRAPARE - Hydrologist (Medical): No    Lack of Transportation (Non-Medical): No  Physical Activity: Insufficiently Active (03/12/2023)   Exercise Vital Sign    Days of Exercise per Week: 3 days    Minutes of Exercise per Session: 30 min  Stress: No Stress Concern Present (03/12/2023)   [place]    Feeling of Stress : Only a little  Social Connections: Socially Isolated (03/12/2023)   Social Connection and Isolation Panel [NHANES]    Frequency of Communication with Friends and Family: Twice a week    Frequency of Social Gatherings with Friends and Family: Twice a week    Attends Religious Services: Never    Marine scientist or Organizations: No    Attends Archivist Meetings: Never    Marital Status: Widowed    Tobacco Counseling Counseling given: Not Answered   Clinical Intake:  Pre-visit preparation completed: Yes  Pain : No/denies pain     Nutritional Risks: None Diabetes: Yes CBG done?: No Did pt. bring in CBG monitor from home?: No  How often do you need to have someone help you when you read instructions, pamphlets, or other written materials from your doctor or pharmacy?: 1 - Never  Diabetic?yes Nutrition Risk Assessment:  Has the patient had any N/V/D within the last 2 months?  No  Does the patient have any non-healing wounds?  No  Has the patient had any unintentional weight loss or weight gain?  No   Diabetes:  Is the patient diabetic?  Yes  If diabetic, was a CBG obtained today?  No  Did the patient bring in their glucometer from home?  No  How often do you monitor your CBG's? Every other day.   Financial Strains and Diabetes Management:  Are you having any financial strains with the device, your supplies or  your medication? No .  Does the  patient want to be seen by Chronic Care Management for management of their diabetes?  No  Would the patient like to be referred to a Nutritionist or for Diabetic Management?  No   Diabetic Exams:  Diabetic Eye Exam: Completed 06/14/22.  Pt has been advised about the importance in completing this exam.  Diabetic Foot Exam: Completed 01/24/23. Pt has been advised about the importance in completing this exam.   Interpreter Needed?: No  Information entered by :: Kirke Shaggy, LPN   Activities of Daily Living    03/12/2023    3:41 PM  In your present state of health, do you have any difficulty performing the following activities:  Hearing? 1  Vision? 1  Difficulty concentrating or making decisions? 0  Walking or climbing stairs? 0  Dressing or bathing? 0  Doing errands, shopping? 0  Preparing Food and eating ? N  Using the Toilet? N  In the past six months, have you accidently leaked urine? N  Do you have problems with loss of bowel control? N  Managing your Medications? N  Managing your Finances? N  Housekeeping or managing your Housekeeping? N    Patient Care Team: Venita Lick, NP as PCP - General (Nurse Practitioner) Minna Merritts, MD as PCP - Cardiology (Cardiology) Minna Merritts, MD as Consulting Physician (Cardiology) Lane Hacker, [facility] (Pharmacist) Valente David, RN as [place] any recent Medical Services you may have received from other than [facility] providers in the past year (date may be approximate).     Assessment:   This is a routine wellness examination for [employer].  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 Vision Screening - Comments:: Wears glasses- [facility]  Dietary issues and exercise activities discussed: Current Exercise Habits: Home exercise routine, Type of exercise: walking, Time (Minutes): 30, Frequency (Times/Week): 3, Weekly Exercise (Minutes/Week): 90,  Intensity: Mild   Goals Addressed             This Visit's Progress    DIET - EAT MORE FRUITS AND VEGETABLES         Depression Screen    03/12/2023    3:34 PM 04/03/2022   10:46 AM 02/15/2022   10:26 AM 12/13/2021   10:32 AM 09/12/2021    2:59 PM 06/07/2021   10:11 AM 05/04/2021   11:48 AM  PHQ 2/9 Scores  PHQ - 2 Score 0 0 0 0 0 0 0  PHQ- 9 Score 0 0  0 0      Fall Risk    03/12/2023    3:39 PM 10/10/2022   10:51 AM 04/03/2022   10:46 AM 02/15/2022   10:24 AM 12/13/2021   10:30 AM  Fall Risk   Falls in the past year? 0 1 0 0 0  Number falls in past yr: 0 1 0 0 0  Injury with Fall? 0 1 0 0 0  Risk for fall due to : No Fall Risks No Fall Risks No Fall Risks    Follow up Falls prevention discussed;Falls evaluation completed Falls evaluation completed Falls evaluation completed Falls evaluation completed;Education provided;Falls prevention discussed Falls evaluation completed    FALL RISK PREVENTION PERTAINING TO THE HOME:  Any stairs in or around the home? Yes  If so, are there any without handrails? No  Home free of loose throw rugs in walkways, pet beds, electrical cords, etc? Yes  Adequate lighting in your home to reduce risk of falls? Yes   ASSISTIVE DEVICES UTILIZED TO PREVENT FALLS:  Life alert? No  Use of a cane, walker or w/c? No  Grab bars in the bathroom? No  Shower chair or bench in shower? Yes  Elevated toilet seat or a handicapped toilet? No    Cognitive Function:        03/12/2023    3:48 PM 02/14/2021    8:19 AM 02/03/2019    8:54 AM 02/01/2018    8:27 AM  6CIT Screen  What Year? 0 points 0 points 0 points 0 points  What month? 0 points 0 points 0 points 0 points  What time? 0 points 0 points 0 points 0 points  Count back from 20 0 points 0 points 0 points 0 points  Months in reverse 0 points 0 points 0 points 0 points  Repeat phrase 2 points 4 points 2 points 2 points  Total Score 2 points 4 points 2 points 2 points     Immunizations Immunization History  Administered Date(s) Administered   Fluad Quad(high Dose 65+) 11/21/2019, 11/19/2020, 09/12/2021, 10/10/2022   Influenza Split 08/25/2013   Influenza, High Dose Seasonal PF 09/20/2016, 11/02/2017, 10/10/2018   Influenza,inj,Quad PF,6+ Mos 11/17/2015   Influenza-Unspecified 12/23/2014   PFIZER(Purple Top)SARS-COV-2 Vaccination 11/22/2020, 12/13/2020   Pneumococcal Conjugate-13 11/25/2015   Pneumococcal Polysaccharide-23 10/25/2013, 02/03/2019   Pneumococcal-Unspecified 10/26/2009   Td 01/24/2023   Tdap 11/06/2012   Zoster, Live 02/04/2013    TDAP status: Up to date  Flu Vaccine status: Up to date  Pneumococcal vaccine status: Up to date  Covid-19 vaccine status: Completed vaccines  Qualifies for Shingles Vaccine? Yes   Zostavax completed Yes   Shingrix Completed?: No.    Education has been provided regarding the importance of this vaccine. Patient has been advised to call insurance company to determine out of pocket expense if they have not yet received this vaccine. Advised may also receive vaccine at local pharmacy or Health Dept. Verbalized acceptance and understanding.  Screening Tests Health Maintenance  Topic Date Due   COVID-19 Vaccine (3 - Pfizer risk series) 03/16/2023 (Originally 01/10/2021)   Zoster Vaccines- Shingrix (1 of 2) 04/24/2023 (Originally 05/06/1970)   OPHTHALMOLOGY EXAM  06/15/2023   HEMOGLOBIN A1C  07/25/2023   Diabetic kidney evaluation - eGFR measurement  01/25/2024   Diabetic kidney evaluation - Urine ACR  01/25/2024   FOOT EXAM  01/25/2024   Medicare Annual Wellness (AWV)  03/11/2024   COLONOSCOPY (Pts 45-84yrs Insurance coverage will need to be confirmed)  05/23/2024   DTaP/Tdap/Td (3 - Td or Tdap) 01/24/2033   Pneumonia Vaccine 3+ Years old  Completed   INFLUENZA VACCINE  Completed   Hepatitis C Screening  Completed   HPV VACCINES  Aged Out    Health Maintenance  There are no preventive care  reminders to display for this patient.   Colorectal cancer screening: Type of screening: Colonoscopy. Completed 05/23/21. Repeat every 3 years  Lung Cancer Screening: (Low Dose CT Chest recommended if Age 53-80 years, 30 pack-year currently smoking OR have quit w/in 15years.) does not qualify.    Additional Screening:  Hepatitis C Screening: does qualify; Completed 11/25/15  Vision Screening: Recommended annual ophthalmology exams for early detection of glaucoma and other disorders of the eye. Is the patient up to date with their annual eye exam?  Yes  Who is the provider or what is the name of the office in which the patient attends  annual eye exams? [facility] If pt is not established with a provider, would they like to be referred to a provider to establish care? No .   Dental Screening: Recommended annual dental exams for proper oral hygiene  Community Resource Referral / Chronic Care Management: CRR required this visit?  No   CCM required this visit?  No      Plan:     I have personally reviewed and noted the following in the patient's chart:   Medical and social history Use of alcohol, tobacco or illicit drugs  Current medications and supplements including opioid prescriptions. Patient is not currently taking opioid prescriptions. Functional ability and status Nutritional status Physical activity Advanced directives List of other physicians Hospitalizations, surgeries, and ER visits in previous 12 months Vitals Screenings to include cognitive, depression, and falls Referrals and appointments  In addition, I have reviewed and discussed with patient certain preventive protocols, quality metrics, and best practice recommendations. A written personalized care plan for preventive services as well as general preventive health recommendations were provided to patient.     Dionisio David, LPN   [id number]   Nurse Notes: none

## 2023-03-12 NOTE — Patient Instructions (Signed)
[name] , Thank you for taking time to come for your Medicare Wellness Visit. I appreciate your ongoing commitment to your health goals. Please review the following plan we discussed and let me know if I can assist you in the future.   These are the goals we discussed:  Goals       DIET - EAT MORE FRUITS AND VEGETABLES      DIET - INCREASE WATER INTAKE       recommend drinking at least 6-8 glasses of water a day       DIET - REDUCE SUGAR INTAKE      Effective management of DM      Care Coordination Interventions: Provided education to patient about basic DM disease process Reviewed medications with patient and discussed importance of medication adherence Discussed plans with patient for ongoing care management follow up and provided patient with direct contact information for care management team Reviewed scheduled/upcoming provider appointments including: PCP visit on 3/13 Advised patient, providing education and rationale, to check cbg daily and record, calling PCP for findings outside established parameters Screening for signs and symptoms of depression related to chronic disease state  Assessed social determinant of health barriers Discussed current A1C of 9.2, aware of goal of less than 7 Educated on importance of finding meter Diet discussed, including staying away from Mellow Yellow drinks and ice cream       Monitor and Manage My Blood Sugar-Diabetes Type 2      Timeframe:  Long-Range Goal Priority:  High Start Date:                             Expected End Date:                       Follow Up Date 3 month follow up    - check blood sugar at prescribed times - check blood sugar if I feel it is too high or too low - enter blood sugar readings and medication or insulin into daily log - take the blood sugar meter to all doctor visits    Why is this important?   Checking your blood sugar at home helps to keep it from getting very high or very low.  Writing the  results in a diary or log helps the doctor know how to care for you.  Your blood sugar log should have the time, date and the results.  Also, write down the amount of insulin or other medicine that you take.  Other information, like what you ate, exercise done and how you were feeling, will also be helpful.     Notes:       Obtain Eye Exam-Diabetes Type 2      Follow Up Date  3 month follow up   - schedule appointment with eye doctor    Why is this important?   Eye check-ups are important when you have diabetes.  Vision loss can be prevented.    Notes:       Patient Stated      02/14/2021, wants to lose weight      Patient Stated      No goals      PharmD "My medications are expensive" (pt-stated)      CARE PLAN ENTRY (see longtitudinal plan of care for additional care plan information)  Current Barriers:  Financial barriers - previously noted concerns affording Xarelto, Invokana, Symbicort,  but never returned patient assistance application information Diabetes: controlled; complicated by chronic medical conditions including hx Aib, CAD, COPD, most recent A1c 5.8% Reports he has lost ~50 lbs by adjusting eating habits Most recent eGFR: ~92 mL/min Current antihyperglycemic regimen: metformin XR 1000 mg BID, glipizide 5 mg w/ supper, Invokana 300 mg QAM Does report one episode of shaking in the evening hat he thought was hypoglycemia, but he did not check his sugars to confirm  Current blood glucose readings:  Fasting: 99-130s Not checking any other times of the day Cardiovascular risk reduction: Current hypertensive regimen: furosemide 20 mg daily, diltiazem 180 mg daily, metoprolol succinate 25 mg daily, ramipril 10 mg daily, isosorbide 60 mg daily Current hyperlipidemia regimen: atorvastatin 80 mg daily; LDL well controlled at goal <70 Current antiplatelet regimen: clopidogrel 75 mg daily + Xarelto 20 mg daily  COPD: previous followed w/ pulmonary. Last spirometry in 2015  showed normal spirometry, Dr. Zoila Shutter note in 2019 indicated some components of interstitial lung disease w/ underlying COPD.   Pharmacist Clinical Goal(s):  Over the next 90 days, patient will work with PharmD and primary care provider to address optimized medication management  Interventions: Comprehensive medication review performed, medication list updated in electronic medical record Inter-disciplinary care team collaboration (see longitudinal plan of care) Praised patient for obtaining control of A1c.  Reviewed goal A1c, goal fasting, and goal 2 hour post prandial glucose. Encouraged to continue to check fasting readings, but add occasional post supper checks. Providing w/ log to document to provide at future review.  Discussed hypoglycemia after supper. Recommend hold glipizide, and monitor sugars after. Patient verbalizes understanding. Encouraged to review post-supper readings w/ RN CM at next phone call to evaluate impact of holding glipizide.  Discussed benefits of home BP monitoring. Reviewed to call [facility] and ask about Over the Counter benefits to purchase a BP machine, recommend arm over wrist. He verbalized understanding Will have patient review income and out of pocket spend to determine if we can obtain expensive medications through patient assistance.   Patient Self Care Activities:  Patient will check blood glucose daily, document, and provide at future appointments Patient will take medications as prescribed Patient will report any questions or concerns to provider   Please see past updates related to this goal by clicking on the "Past Updates" button in the selected goal        RNCM: Pt-" I have not taken my blood sugars in 2 weeks" (pt-stated)      CARE PLAN ENTRY (see longitudinal plan of care for additional care plan information)  Current Barriers:  Knowledge Deficits related to the importance of managing diabetes as evidence of hemoglobin A1C of 14 in November  2020, newest hemoglobin A1C 5.8 on 04-12-2020 Lacks caregiver support.  Film/video editor.  Non-adherence to prescribed medication regimen Difficulty obtaining medications Lacks motivation   Nurse Case Manager Clinical Goal(s):  Over the next 120 days, patient will verbalize understanding of plan for diabetes management  Over the next 120 days, patient will work with [place], pcp and pharmacist to address needs related to effective diabetes management  Over the next 120 days, patient will demonstrate a decrease in high blood sugars exacerbations as evidenced by monitoring blood sugars daily, adhering  to dietary restrictions, and following plan of care as recommended by the provider Over the next 120 days, patient will attend all scheduled medical appointments: appointment with pcp on 04-12-2020 Over the next 120 days, patient will demonstrate improved adherence to prescribed  treatment plan for diabetes as evidenced bychecking blood sugars daily, decrease in hemoglobin A1C, compliance with medications regimen and following the plan of care established by provider Over the next 120 days, patient will verbalize basic understanding of diabetes disease process and self health management plan as evidenced by compliance with plan of care and decrease in blood sugar levels Over the next 120 days, patient will demonstrate understanding of rationale for each prescribed medication as evidenced by compliance and ability to obtain medications without cost constraints  Over the next 120 days, patient will work with CM team pharmacist to get needed help with cost of prescriptions, obtaining needed refills and being more compliant with medication regimen.   Interventions:  Inter-disciplinary care team collaboration (see longitudinal plan of care) Evaluation of current treatment plan related to diabetes management and patient's adherence to plan as established by provider. Advised patient to check with pharmacy  concerning needed refills. The patient states that he was there yesterday and the still do not have scripts for refills. The patient also needs a new glucose meter that his insurance provider will pay for strips and kit. Will send inbasket message to pharmacist and pcp- Completed Provided education to patient re: on putting am pills he takes in a plastic bag and have with his clothes so he will not forget to take or set an alarm on his phone to remind him to take. The patient admits when he is working he leaves and forgets to take his medications Reviewed medications with patient and discussed compliance and ways to remember to take medications.  Collaborated with CCM team pharmacist and pcp regarding the need for new glucose machine ( would like one where the insurance will pay for kit and strips), the need for medication scripts, the patient not taking blood glucose levels regularly and not always following dietary restrictions. Discussed plans with patient for ongoing care management follow up and provided patient with direct contact information for care management team Advised patient, providing education and rationale, to check cbg at least one time daily and record, calling pcp for findings outside established parameters.   Education on the benefits of a heart healthy/ADA diet in controlling blood sugar levels. The patient eats out a lot and admits he eats more sweets than he should at times.  The patient is now eating one meal a day and has cut back considerably. Has lost weight. Newest hemoglobin A1C on 04-12-2020 was 5.8. Praised for accomplishments.  Evaluation of post prandial blood sugars. The patient verbalized the highest post prandial was 172. Fasting is 90's to 110.  He has not checked in a couple days due to being stung by hornets and not feeling the best. Feels better now.  Education on the negative impact of uncontrolled diabetes on other body systems.  The patient verbalized he needs to do  better at taking care of his diabetes.  Pharmacy referral for help with medication cost, compliance and scripts.   Patient Self Care Activities:  Patient verbalizes understanding of plan to work with CCM team to help patient manage diabetes more effectively Self administers medications as prescribed Attends all scheduled provider appointments Calls pharmacy for medication refills Calls provider office for new concerns or questions Unable to independently manage diabetes as evidence of hemoglobin A1C of 14 in November 2020. Most updated hemoglobin A1C was 5.8 on 04-12-2020. Unable to self administer medications as prescribed Does not adhere to provider recommendations re:  Does not adhere to prescribed medication regimen Does  not contact provider office for questions/concerns  Please see past updates related to this goal by clicking on the "Past Updates" button in the selected goal        RNCM: Pt-"I have a lot of health issues" (pt-stated)      [place] (see longtitudinal plan of care for additional care plan information)  Current Barriers:  Chronic Disease Management support, education, and care coordination needs related to Atrial Fibrillation, HTN, HLD, COPD, and RLS Does not have blood pressure cuff to check blood sugars No AD/LW Needs scripts for gabapentin, albuterol inhaler, and cream for athlete's feet  Clinical Goal(s) related to Atrial Fibrillation, HTN, HLD, COPD, and RLS :  Over the next 120 days, patient will:  Work with the care management team to address educational, disease management, and care coordination needs  Begin or continue self health monitoring activities as directed today Measure and record blood pressure 2 times per week and adhere to a heart healthy/ADA diet Call provider office for new or worsened signs and symptoms Blood pressure findings outside established parameters, Oxygen saturation lower than established parameter, Chest pain, Shortness of  breath, and New or worsened symptom related to HLD, RLS and other chronic conditions Call care management team with questions or concerns Verbalize basic understanding of patient centered plan of care established today  Interventions related to Atrial Fibrillation, HTN, HLD, COPD, and RLS :  Evaluation of current treatment plans and patient's adherence to plan as established by provider Assessed patient understanding of disease states Assessed patient's education and care coordination needs.  The patient needs scripts for his inhaler, gabapentin, athlete's feet and new glucose meter. Also having difficulty with medication cost. Done  Assessed concern of RLS being a big issue for him right now. He says the pharmacist has not received the script for gabapentin. In basket message to the pharmacist and pcp. Done Provided disease specific education to patient: Education on heart healthy/ADA diet and benefits. The patient eats out a lot.  06-04-2020: Has lost weight and is being more mindful of his dietary restrictions.  Provided education on checking blood pressures at home. The patient does not have a blood pressure cuff. Will work on ways to get a blood pressure cuff for the patient. Plan to give patient a log book at 04-12-2020 appointment for writing down his blood pressures and blood glucose readings.  Education on AD/LW- the patient agrees to receive a packet to review with his daughter at his visit on 04-12-2020.  Collaborated with appropriate clinical care team members regarding patient needs.  The patient has worked with the pharmacist in the past. Re referral for help with medication cost, obtaining refills, and ways to get the patient to take medication as prescribed.  Evaluation of bee stings and missed appointment on 06-04-2020 with pcp. The patient states he got stung day before last and wanted to be seen in the office on 06/03/2020. He hung up and did not get the time for appointment. States he did  not need to be seen today as he is better. The patient has been taking over the counter benadryl, has swabbed the areas with alcohol and used camomile lotion on the area. The patient verbalized that he has 5 areas where her got stung on his arms. Education on going to ER or urgent care for changes in condition. The patient has not had an allergic reaction to bee stings in the past. Education and support given.   Patient Self Care Activities  related to Atrial Fibrillation, HTN, HLD, COPD, and RLS :  Patient is unable to independently self-manage chronic health conditions  Please see past updates related to this goal by clicking on the "Past Updates" button in the selected goal        Track and Manage Fluids and Swelling-Heart Failure      Timeframe:  Short-Term Goal Priority:  High Start Date:                             Expected End Date:                       Follow Up Date 3 month follow up   - call office if I gain more than 2 pounds in one day or 5 pounds in one week - do ankle pumps when sitting - meet with dietitian - track weight in diary - watch for swelling in feet, ankles and legs every day - weigh myself daily    Why is this important?   It is important to check your weight daily and watch how much salt and liquids you have.  It will help you to manage your heart failure.    Notes:         This is a list of the screening recommended for you and due dates:  Health Maintenance  Topic Date Due   COVID-19 Vaccine (3 - Pfizer risk series) 03/16/2023*   Zoster (Shingles) Vaccine (1 of 2) 04/24/2023*   Eye exam for diabetics  06/15/2023   Hemoglobin A1C  07/25/2023   Yearly kidney function blood test for diabetes  01/25/2024   Yearly kidney health urinalysis for diabetes  01/25/2024   Complete foot exam   01/25/2024   Medicare Annual Wellness Visit  03/11/2024   Colon Cancer Screening  05/23/2024   DTaP/Tdap/Td vaccine (3 - Td or Tdap) 01/24/2033   Pneumonia Vaccine   Completed   Flu Shot  Completed   Hepatitis C Screening: USPSTF Recommendation to screen - Ages 32-79 yo.  Completed   HPV Vaccine  Aged Out  *Topic was postponed. The date shown is not the original due date.    Advanced directives: no  Conditions/risks identified: none  Next appointment: Follow up in one year for your annual wellness visit. 03/17/24 @ 2:30 pm by phone  Preventive Care 65 Years and Older, Male  Preventive care refers to lifestyle choices and visits with your health care provider that can promote health and wellness. What does preventive care include? A yearly physical exam. This is also called an annual well check. Dental exams once or twice a year. Routine eye exams. Ask your health care provider how often you should have your eyes checked. Personal lifestyle choices, including: Daily care of your teeth and gums. Regular physical activity. Eating a healthy diet. Avoiding tobacco and drug use. Limiting alcohol use. Practicing safe sex. Taking low doses of aspirin every day. Taking vitamin and mineral supplements as recommended by your health care provider. What happens during an annual well check? The services and screenings done by your health care provider during your annual well check will depend on your age, overall health, lifestyle risk factors, and family history of disease. Counseling  Your health care provider may ask you questions about your: Alcohol use. Tobacco use. Drug use. Emotional well-being. Home and relationship well-being. Sexual activity. Eating habits. History of falls. Memory and ability to understand (  cognition). Work and work Statistician. Screening  You may have the following tests or measurements: Height, weight, and BMI. Blood pressure. Lipid and cholesterol levels. These may be checked every 5 years, or more frequently if you are over 66 years old. Skin check. Lung cancer screening. You may have this screening every year  starting at age 71 if you have a 30-pack-year history of smoking and currently smoke or have quit within the past 15 years. Fecal occult blood test (FOBT) of the stool. You may have this test every year starting at age 75. Flexible sigmoidoscopy or colonoscopy. You may have a sigmoidoscopy every 5 years or a colonoscopy every 10 years starting at age 81. Prostate cancer screening. Recommendations will vary depending on your family history and other risks. Hepatitis C blood test. Hepatitis B blood test. Sexually transmitted disease (STD) testing. Diabetes screening. This is done by checking your blood sugar (glucose) after you have not eaten for a while (fasting). You may have this done every 1-3 years. Abdominal aortic aneurysm (AAA) screening. You may need this if you are a current or former smoker. Osteoporosis. You may be screened starting at age 23 if you are at high risk. Talk with your health care provider about your test results, treatment options, and if necessary, the need for more tests. Vaccines  Your health care provider may recommend certain vaccines, such as: Influenza vaccine. This is recommended every year. Tetanus, diphtheria, and acellular pertussis (Tdap, Td) vaccine. You may need a Td booster every 10 years. Zoster vaccine. You may need this after age 39. Pneumococcal 13-valent conjugate (PCV13) vaccine. One dose is recommended after age 42. Pneumococcal polysaccharide (PPSV23) vaccine. One dose is recommended after age 57. Talk to your health care provider about which screenings and vaccines you need and how often you need them. This information is not intended to replace advice given to you by your health care provider. Make sure you discuss any questions you have with your health care provider. Document Released: 12/31/2015 Document Revised: 08/23/2016 Document Reviewed: 10/05/2015 Elsevier Interactive Patient Education  2017 [place] Prevention in the  Home Falls can cause injuries. They can happen to people of all ages. There are many things you can do to make your home safe and to help prevent falls. What can I do on the outside of my home? Regularly fix the edges of walkways and driveways and fix any cracks. Remove anything that might make you trip as you walk through a door, such as a raised step or threshold. Trim any bushes or trees on the path to your home. Use bright outdoor lighting. Clear any walking paths of anything that might make someone trip, such as rocks or tools. Regularly check to see if handrails are loose or broken. Make sure that both sides of any steps have handrails. Any raised decks and porches should have guardrails on the edges. Have any leaves, snow, or ice cleared regularly. Use sand or salt on walking paths during winter. Clean up any spills in your garage right away. This includes oil or grease spills. What can I do in the bathroom? Use night lights. Install grab bars by the toilet and in the tub and shower. Do not use towel bars as grab bars. Use non-skid mats or decals in the tub or shower. If you need to sit down in the shower, use a plastic, non-slip stool. Keep the floor dry. Clean up any water that spills on the floor as soon  as it happens. Remove soap buildup in the tub or shower regularly. Attach bath mats securely with double-sided non-slip rug tape. Do not have throw rugs and other things on the floor that can make you trip. What can I do in the bedroom? Use night lights. Make sure that you have a light by your bed that is easy to reach. Do not use any sheets or blankets that are too big for your bed. They should not hang down onto the floor. Have a firm chair that has side arms. You can use this for support while you get dressed. Do not have throw rugs and other things on the floor that can make you trip. What can I do in the kitchen? Clean up any spills right away. Avoid walking on wet  floors. Keep items that you use a lot in easy-to-reach places. If you need to reach something above you, use a strong step stool that has a grab bar. Keep electrical cords out of the way. Do not use floor polish or wax that makes floors slippery. If you must use wax, use non-skid floor wax. Do not have throw rugs and other things on the floor that can make you trip. What can I do with my stairs? Do not leave any items on the stairs. Make sure that there are handrails on both sides of the stairs and use them. Fix handrails that are broken or loose. Make sure that handrails are as long as the stairways. Check any carpeting to make sure that it is firmly attached to the stairs. Fix any carpet that is loose or worn. Avoid having throw rugs at the top or bottom of the stairs. If you do have throw rugs, attach them to the floor with carpet tape. Make sure that you have a light switch at the top of the stairs and the bottom of the stairs. If you do not have them, ask someone to add them for you. What else can I do to help prevent falls? Wear shoes that: Do not have high heels. Have rubber bottoms. Are comfortable and fit you well. Are closed at the toe. Do not wear sandals. If you use a stepladder: Make sure that it is fully opened. Do not climb a closed stepladder. Make sure that both sides of the stepladder are locked into place. Ask someone to hold it for you, if possible. Clearly mark and make sure that you can see: Any grab bars or handrails. First and last steps. Where the edge of each step is. Use tools that help you move around (mobility aids) if they are needed. These include: Canes. Walkers. Scooters. Crutches. Turn on the lights when you go into a dark area. Replace any light bulbs as soon as they burn out. Set up your furniture so you have a clear path. Avoid moving your furniture around. If any of your floors are uneven, fix them. If there are any pets around you, be aware of  where they are. Review your medicines with your doctor. Some medicines can make you feel dizzy. This can increase your chance of falling. Ask your doctor what other things that you can do to help prevent falls. This information is not intended to replace advice given to you by your health care provider. Make sure you discuss any questions you have with your health care provider. Document Released: 09/30/2009 Document Revised: 05/11/2016 Document Reviewed: 01/08/2015 Elsevier Interactive Patient Education  2017 [employer].

## 2023-03-14 ENCOUNTER — Ambulatory Visit (INDEPENDENT_AMBULATORY_CARE_PROVIDER_SITE_OTHER): Payer: Medicare HMO | Admitting: Nurse Practitioner

## 2023-03-14 ENCOUNTER — Encounter: Payer: Self-pay | Admitting: Nurse Practitioner

## 2023-03-14 VITALS — BP 131/81 | HR 80 | Temp 97.9°F | Ht 70.0 in | Wt 241.1 lb

## 2023-03-14 DIAGNOSIS — I200 Unstable angina: Secondary | ICD-10-CM

## 2023-03-14 DIAGNOSIS — I4811 Longstanding persistent atrial fibrillation: Secondary | ICD-10-CM | POA: Diagnosis not present

## 2023-03-14 NOTE — Assessment & Plan Note (Signed)
Stable at this time with improvement in pain.  Will continue to collaborate with cardiology and continue current medication regimen as ordered by them.  He is aware of red flags and reasons to immediately go to ER.

## 2023-03-14 NOTE — Assessment & Plan Note (Signed)
Chronic, ongoing.  Rate controlled.  Continue collaboration with cardiology and current medication regimen as ordered by them. 

## 2023-03-14 NOTE — Progress Notes (Signed)
BP 131/81   Pulse 80   Temp 97.9 F (36.6 C) (Oral)   Ht 5\' 10"  (1.778 m)   Wt 241 lb 1.6 oz (109.4 kg)   SpO2 96%   BMI 34.59 kg/m    Subjective:    Patient ID: Ernest [name], male    DOB: [birth year], 72 y.o.   MRN: [id number]  HPI: Ernest [name] is a 72 y.o. male  Chief Complaint  Patient presents with   Hospitalization Follow-up    [facility], was in for a few days, had a heart cath. Went to [place] after last visit and was transferred to [facility] main in [place].   Transition of Care Hospital Follow up.  Was admitted to [facility] on 02/28/23 for chest pain.  Discharged 03/02/23. He reports minimal chest pain since discharge, states "a little but not bad".  Is to follow-up with cardiology.  Had left heart cath on 03/01/23 at [facility].    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 (CMS-HCC) (POA: Yes) Atrial fibrillation (CMS-HCC) (POA: Yes) Chest pain (POA: Yes) COPD (chronic obstructive pulmonary disease) with emphysema (CMS-HCC) (POA: Yes) GERD (gastroesophageal reflux disease) (POA: Yes) Hyperlipidemia associated with type 2 diabetes mellitus (CMS-HCC) (POA: Yes) Hypertension associated with diabetes (CMS-HCC) (POA: Yes) Restless legs (POA: Yes) Stage 3a chronic kidney disease (CKD) (CMS-HCC) (POA: Yes) Type 2 diabetes mellitus with morbid obesity (CMS-HCC) (POA: Yes) Vitamin D deficiency (POA: Yes)  Hospital Course:  Unstable Angina I CAD s/p PCI/DES to LAD in 2020 I HLD Patient presented with symptoms concerning for ACS although troponin flat at 5 x3. He has a history of prior NSTEMI w/ LHC showed 80 % mid LAD stenosis which was successfully treated w/ PCI/DES as well as nonobstructive disease involving the left main, Lcx, and RCA. To note during the time of his prior NSTEMI he did not have CP just fatigue.  He had a normal nuclear stress test in 11/2021 at OSH. Most recent TTE (04/2021) demonstrated an EF of 60 to 65%, no regional wall motion abnormalities, mildly dilated internal LV cavity size, indeterminate LV diastolic function parameters, normal RV systolic function with mildly enlarged RV cavity size, elevated PASP, moderately dilated right atrium, and mild mitral regurgitation. EKG in ED showed slow atrial fibrillation but no acute ST changes. CXR showed mild interstitial pulmonary edema, enlarged pulmonary artery, and cardiomegaly. TIMI score is 4. S/p 324mg  aspirin in the ED. Indication for urgent LHC: refractory angina.Patient underwent LHC that revealed diffuse non-obstructive coronary artery disease with a patent LAD stent. Continue secondary prevention and recommend close outpatient cardiology follow up.  Patient has room to go up on his anti-anginal medications.   Persistent Afib  Chronic afib currently rate controlled. CHA2DS2-VASc at least 4 (HTN, stroke, CHF). Patient resumed on home Xarelto and continued his home Metoprolol. Resumed home Dilt 180 mg on discharge.   HFpEF  No formal documentation of HFpEF in patient's chart. Last TTE in [id number] diastolic parameters were indeterminate. He is on home diuretics. On exam he did have trace lower extremity pitting edema bilaterally. Chest x-ray was notable for mild interstitial pulmonary edema. We continued home Lasix 20 BID. Recommend outpatient echocardiogram.   Elevated Bilirubin Bilirubin elevated at 2.0 which appears to be chronic for him based on labs in care everywhere. No RUQ abdominal pain. Recommend outpatient follow up.   DMII  Last A1c  February 9.2%. Currently on Trulicity weekly and Metformin. Recommend continuation.   HTN  BP ranging 120s-140s/70s-80s in ED. Metoprolol, Dilt, and Ramipril were resumed.    CKD Baseline Cr appears to be 1.1-1.3. Cr 1.32 on admission. His creatinine was trended with a daily CMP.   COPD not in  exacerbation Not taking his inhalers as prescribed at home and only as needed. He was continued on home inhalers while inpatient.   GERD - Continued home Protonix 40 mg daily  Restless legs - Continued home gabapentin 1200 mg nightly"  Hospital/Facility: [facility] D/C Physician: Dr. Percell Miller D/C Date: 03/02/23  Records Requested: 03/14/23 Records Received: 03/14/23 Records Reviewed: 03/14/23  Diagnoses on Discharge: Unstable angina (CMS-HCC)  Date of interactive Contact within 48 hours of discharge:  Contact was through: phone 03/05/23  Date of 7 day or 14 day face-to-face visit:    within 14 days  Outpatient Encounter Medications as of 03/14/2023  Medication Sig   ACCU-CHEK GUIDE test strip    albuterol (VENTOLIN HFA) 108 (90 Base) MCG/ACT inhaler Inhale 2 puffs into the lungs every 4 (four) hours as needed for wheezing or shortness of breath. Uses 2-3 times daily   atorvastatin (LIPITOR) 80 MG tablet TAKE 1 TABLET(80 MG) BY MOUTH DAILY   Budeson-Glycopyrrol-Formoterol (BREZTRI AEROSPHERE) 160-9-4.8 MCG/ACT AERO Inhale 2 puffs into the lungs 2 (two) times daily.   Cholecalciferol 1.25 MG (50000 UT) TABS Take 1 tablet by mouth once a week.   clotrimazole-betamethasone (LOTRISONE) cream Apply 1 Application topically 2 (two) times daily.   dapagliflozin propanediol (FARXIGA) 10 MG TABS tablet Take 1 tablet (10 mg total) by mouth daily.   diltiazem (CARDIZEM CD) 180 MG 24 hr capsule Take 1 capsule (180 mg total) by mouth daily.   ezetimibe (ZETIA) 10 MG tablet Take 1 tablet (10 mg total) by mouth daily.   furosemide (LASIX) 20 MG tablet TAKE ONE TABLET BY MOUTH EVERY MORNING and TAKE ONE TABLET BY MOUTH EVERYDAY AT BEDTIME   gabapentin (NEURONTIN) 600 MG tablet Take 2 tablets (1,200 mg total) by mouth at bedtime.   isosorbide mononitrate (IMDUR) 60 MG 24 hr tablet TAKE 1 TABLET(60 MG) BY MOUTH DAILY   metFORMIN (GLUCOPHAGE-XR) 500 MG 24 hr tablet Take 1 tablet (500 mg total) by mouth  in the morning and at bedtime. 1 tab bid   metoprolol succinate (TOPROL-XL) 50 MG 24 hr tablet TAKE 1 TABLET BY MOUTH ONCE DAILY WITH OR IMMEDIATELY FOLLOWING A MEAL   nitroGLYCERIN (NITROSTAT) 0.4 MG SL tablet Place 1 tablet (0.4 mg total) under the tongue every 5 (five) minutes as needed for chest pain.   pantoprazole (PROTONIX) 40 MG tablet TAKE 1 TABLET(40 MG) BY MOUTH DAILY   ramipril (ALTACE) 10 MG capsule Take 1 capsule (10 mg total) by mouth daily.   rivaroxaban (XARELTO) 20 MG TABS tablet TAKE ONE TABLET BY MOUTH EVERY EVENING   [DISCONTINUED] Dulaglutide (TRULICITY) A999333 [id number] SOPN Inject 0.75 mg into the skin once a week.   No facility-administered encounter medications on file as of 03/14/2023.    Diagnostic Tests Reviewed/Disposition: Reviewed within chart under Care Everywhere  Consults: Cardiology  Discharge Instructions: Follow-up with PCP and cardiology  Disease/illness Education: Reviewed with patient in office  Home Health/Community Services Discussions/Referrals: None  Establishment or re-establishment of referral orders for community resources: None  Discussion with other health care providers: Reviewed all notes in chart  Assessment and Support of treatment regimen adherence: Reviewed with patient in office  Appointments Coordinated  with: Reviewed with patient in office  Education for self-management, independent living, and ADLs:  Reviewed with patient in office  Relevant past medical, surgical, family and social history reviewed and updated as indicated. Interim medical history since our last visit reviewed. Allergies and medications reviewed and updated.  Review of Systems  Constitutional:  Negative for activity change, diaphoresis, fatigue and fever.  Respiratory:  Negative for cough, chest tightness, shortness of breath and wheezing.   Cardiovascular:  Positive for chest pain (improving). Negative for palpitations and leg swelling.  Gastrointestinal:  Negative.   Endocrine: Negative for cold intolerance, heat intolerance, polydipsia, polyphagia and polyuria.  Neurological: Negative.   Psychiatric/Behavioral: Negative.     Per HPI unless specifically indicated above     Objective:    BP 131/81   Pulse 80   Temp 97.9 F (36.6 C) (Oral)   Ht 5\' 10"  (1.778 m)   Wt 241 lb 1.6 oz (109.4 kg)   SpO2 96%   BMI 34.59 kg/m   Wt Readings from Last 3 Encounters:  03/14/23 241 lb 1.6 oz (109.4 kg)  03/12/23 246 lb (111.6 kg)  02/28/23 246 lb 6.4 oz (111.8 kg)    Physical Exam Vitals and nursing note reviewed.  Constitutional:      General: He is awake. He is not in acute distress.    Appearance: He is well-developed and well-groomed. He is obese. He is not ill-appearing or toxic-appearing.  HENT:     Head: Normocephalic.     Right Ear: Hearing and external ear normal.     Left Ear: Hearing and external ear normal.  Eyes:     General: Lids are normal.     Extraocular Movements: Extraocular movements intact.     Conjunctiva/sclera: Conjunctivae normal.  Neck:     Thyroid: No thyromegaly.     Vascular: No carotid bruit.  Cardiovascular:     Rate and Rhythm: Normal rate. Rhythm irregularly irregular.     Heart sounds: Normal heart sounds.  Pulmonary:     Effort: Pulmonary effort is normal. No accessory muscle usage or respiratory distress.     Breath sounds: Normal breath sounds.  Abdominal:     General: Bowel sounds are normal. There is no distension.     Palpations: Abdomen is soft.     Tenderness: There is no abdominal tenderness.  Musculoskeletal:     Cervical back: Full passive range of motion without pain.     Right lower leg: No edema.     Left lower leg: No edema.  Lymphadenopathy:     Cervical: No cervical adenopathy.  Skin:    General: Skin is warm.     Capillary Refill: Capillary refill takes less than 2 seconds.  Neurological:     Mental Status: He is alert and oriented to person, place, and time.     Deep  Tendon Reflexes: Reflexes are normal and symmetric.     Reflex Scores:      Brachioradialis reflexes are 2+ on the right side and 2+ on the left side.      Patellar reflexes are 2+ on the right side and 2+ on the left side. Psychiatric:        Attention and Perception: Attention normal.        Mood and Affect: Mood normal.        Speech: Speech normal.        Behavior: Behavior normal. Behavior is cooperative.        Thought Content:  Thought content normal.    Results for orders placed or performed in visit on 01/24/23  HgB A1c  Result Value Ref Range   Hgb A1c MFr Bld 9.2 (H) 4.8 - 5.6 %   Est. average glucose Bld gHb Est-mCnc 217 mg/dL  Urine Microalbumin w/creat. ratio  Result Value Ref Range   Creatinine, Urine 80.6 Not Estab. mg/dL   Microalbumin, Urine 28.9 Not Estab. ug/mL   Microalb/Creat Ratio 36 (H) 0 - 29 mg/g creat  CBC with Differential/Platelet  Result Value Ref Range   WBC 5.1 3.4 - 10.8 x10E3/uL   RBC 5.31 4.14 - 5.80 x10E6/uL   Hemoglobin 13.9 13.0 - 17.7 g/dL   Hematocrit 43.2 37.5 - 51.0 %   MCV 81 79 - 97 fL   MCH 26.2 (L) 26.6 - 33.0 pg   MCHC 32.2 31.5 - 35.7 g/dL   RDW 13.9 11.6 - 15.4 %   Platelets 166 150 - 450 x10E3/uL   Neutrophils 51 Not Estab. %   Lymphs 39 Not Estab. %   Monocytes 6 Not Estab. %   Eos 3 Not Estab. %   Basos 1 Not Estab. %   Neutrophils Absolute 2.6 1.4 - 7.0 x10E3/uL   Lymphocytes Absolute 2.0 0.7 - 3.1 x10E3/uL   Monocytes Absolute 0.3 0.1 - 0.9 x10E3/uL   EOS (ABSOLUTE) 0.1 0.0 - 0.4 x10E3/uL   Basophils Absolute 0.0 0.0 - 0.2 x10E3/uL   Immature Granulocytes 0 Not Estab. %   Immature Grans (Abs) 0.0 0.0 - 0.1 x10E3/uL  Lipid Panel w/o Chol/HDL Ratio  Result Value Ref Range   Cholesterol, Total 83 (L) 100 - 199 mg/dL   Triglycerides 144 0 - 149 mg/dL   HDL 31 (L) >39 mg/dL   VLDL Cholesterol Cal 25 5 - 40 mg/dL   LDL Chol Calc (NIH) 27 0 - 99 mg/dL  Comprehensive metabolic panel  Result Value Ref Range   Glucose  195 (H) 70 - 99 mg/dL   BUN 11 8 - 27 mg/dL   Creatinine, Ser 1.22 0.76 - 1.27 mg/dL   eGFR 63 >59 mL/min/1.73   BUN/Creatinine Ratio 9 (L) 10 - 24   Sodium 142 134 - 144 mmol/L   Potassium 3.9 3.5 - 5.2 mmol/L   Chloride 102 96 - 106 mmol/L   CO2 24 20 - 29 mmol/L   Calcium 9.3 8.6 - 10.2 mg/dL   Total Protein 6.7 6.0 - 8.5 g/dL   Albumin 4.6 3.8 - 4.8 g/dL   Globulin, Total 2.1 1.5 - 4.5 g/dL   Albumin/Globulin Ratio 2.2 1.2 - 2.2   Bilirubin Total 1.7 (H) 0.0 - 1.2 mg/dL   Alkaline Phosphatase 103 44 - 121 IU/L   AST 21 0 - 40 IU/L   ALT 25 0 - 44 IU/L  TSH  Result Value Ref Range   TSH 3.720 0.450 - 4.500 uIU/mL  VITAMIN D 25 Hydroxy (Vit-D Deficiency, Fractures)  Result Value Ref Range   Vit D, 25-Hydroxy 6.7 (L) 30.0 - 100.0 ng/mL  Vitamin B12  Result Value Ref Range   Vitamin B-12 322 232 - 1,245 pg/mL  Magnesium  Result Value Ref Range   Magnesium 1.7 1.6 - 2.3 mg/dL  Thyroid peroxidase antibody  Result Value Ref Range   Thyroperoxidase Ab SerPl-aCnc <9 0 - 34 IU/mL  T4, free  Result Value Ref Range   Free T4 1.37 0.82 - 1.77 ng/dL      Assessment & Plan:   Problem List Items  Addressed This Visit       Cardiovascular and Mediastinum   Atrial fibrillation (HCC)    Chronic, ongoing.  Rate controlled.  Continue collaboration with cardiology and current medication regimen as ordered by them.        Unstable angina (HCC) - Primary    Stable at this time with improvement in pain.  Will continue to collaborate with cardiology and continue current medication regimen as ordered by them.  He is aware of red flags and reasons to immediately go to ER.        Follow up plan: Return for as scheduled in May.

## 2023-03-14 NOTE — Patient Instructions (Signed)

## 2023-03-14 NOTE — Progress Notes (Signed)
Care Management & Coordination Services Pharmacy Note  03/14/2023 Name:  Ernest [name] MRN:  [id number] DOB:  05-Jul-1951  Summary: -Pleasant 72 year old male presents for f/u CCM visit. He has a garden full of corn, beans, tomatoes, squash, and okra. He used to work for [employer] for 28 years. Currently, he loves to fish and camp. He also tinkers on his cars, specifically his [place]   Recommendations/Changes made from today's visit: -Patient's Metformin was decreased from 1000mg  BID to 500mg  BID and sugars jumped up. Will co-sign PCP and ask about bumping back up to 1000mg  BID -Patient hasn't been checking sugars. Will have Tracy call in 7 days to get sugar readings -Trulicity is Tier 3. Already sent msg to PCP to change to Ozepic for PAP    Subjective: Ernest [name] is an 72 y.o. year old male who is a primary patient of Cannady, Barbaraann Faster, NP.  The care coordination team was consulted for assistance with disease management and care coordination needs.    Engaged with patient by telephone for follow up visit.    Objective:  Lab Results  Component Value Date   CREATININE 1.22 01/24/2023   BUN 11 01/24/2023   EGFR 63 01/24/2023   GFRNONAA >60 04/04/2021   GFRAA 81 11/19/2020   NA 142 01/24/2023   K 3.9 01/24/2023   CALCIUM 9.3 01/24/2023   CO2 24 01/24/2023   GLUCOSE 195 (H) 01/24/2023    Lab Results  Component Value Date/Time   HGBA1C 9.2 (H) 01/24/2023 12:00 AM   HGBA1C 7.2 (H) 10/10/2022 11:50 AM   HGBA1C 7.6 (H) 07/04/2022 11:47 AM   HGBA1C 7.5 (H) 07/25/2014 04:25 AM   MICROALBUR 80 (H) 12/05/2021 08:34 AM   MICROALBUR 80 (H) 05/31/2021 02:16 PM    Last diabetic Eye exam:  Lab Results  Component Value Date/Time   HMDIABEYEEXA No Retinopathy 06/14/2022 12:00 AM    Last diabetic Foot exam: No results found for: "HMDIABFOOTEX"   Lab Results  Component Value Date   CHOL 83 (L) 01/24/2023   HDL 31 (L) 01/24/2023   LDLCALC 27  01/24/2023   TRIG 144 01/24/2023   CHOLHDL 2.6 04/03/2022       Latest Ref Rng & Units 01/24/2023   12:00 AM 07/04/2022   11:49 AM 12/05/2021    8:38 AM  Hepatic Function  Total Protein 6.0 - 8.5 g/dL 6.7  7.1  6.8   Albumin 3.8 - 4.8 g/dL 4.6  4.7  4.5   AST 0 - 40 IU/L 21  40  19   ALT 0 - 44 IU/L 25  39  15   Alk Phosphatase 44 - 121 IU/L 103  110  90   Total Bilirubin 0.0 - 1.2 mg/dL 1.7  2.5  1.4     Lab Results  Component Value Date/Time   TSH 3.720 01/24/2023 12:00 AM   TSH 2.960 12/05/2021 08:38 AM   FREET4 1.37 01/24/2023 12:00 AM   FREET4 1.36 12/20/2020 11:32 AM       Latest Ref Rng & Units 01/24/2023   12:00 AM 11/14/2021    1:06 PM 08/30/2021   10:11 AM  CBC  WBC 3.4 - 10.8 x10E3/uL 5.1  7.4  6.6   Hemoglobin 13.0 - 17.7 g/dL 13.9  13.5  13.4   Hematocrit 37.5 - 51.0 % 43.2  41.6  42.2   Platelets 150 - 450 x10E3/uL 166  170  171  Lab Results  Component Value Date/Time   VD25OH 6.7 (L) 01/24/2023 12:00 AM   VITAMINB12 322 01/24/2023 12:00 AM   VITAMINB12 360 04/03/2022 11:06 AM    Clinical ASCVD: Yes  The ASCVD Risk score (Arnett DK, et al., 2019) failed to calculate for the following reasons:   The valid total cholesterol range is 130 to 320 mg/dL    Other: (CHADS2VASc if Afib, MMRC or CAT for COPD, ACT, DEXA)     03/12/2023    3:34 PM 04/03/2022   10:46 AM 02/15/2022   10:26 AM  Depression screen PHQ 2/9  Decreased Interest 0 0 0  Down, Depressed, Hopeless 0 0 0  PHQ - 2 Score 0 0 0  Altered sleeping 0 0   Tired, decreased energy 0 0   Change in appetite 0 0   Feeling bad or failure about yourself  0 0   Trouble concentrating 0 0   Moving slowly or fidgety/restless 0 0   Suicidal thoughts 0 0   PHQ-9 Score 0 0   Difficult doing work/chores Not difficult at all Not difficult at all      Social History   Tobacco Use  Smoking Status Former   Packs/day: 4.00   Years: 28.00   Additional pack years: 0.00   Total pack years: 112.00    Types: Cigarettes   Quit date: 08/25/1993   Years since quitting: 29.5  Smokeless Tobacco Never   BP Readings from Last 3 Encounters:  02/28/23 125/77  01/24/23 136/83  01/09/23 120/60   Pulse Readings from Last 3 Encounters:  02/28/23 66  01/24/23 80  01/09/23 77   Wt Readings from Last 3 Encounters:  03/12/23 246 lb (111.6 kg)  02/28/23 246 lb 6.4 oz (111.8 kg)  01/24/23 252 lb 11.2 oz (114.6 kg)   BMI Readings from Last 3 Encounters:  03/12/23 35.30 kg/m  02/28/23 35.35 kg/m  01/24/23 36.26 kg/m    Allergies  Allergen Reactions   Doxycycline Other (See Comments)    Mouth sores     Medications Reviewed Today     Reviewed by Dionisio David, LPN (Licensed Practical Nurse) on 03/12/23 at 1554  Med List Status: <None>   Medication Order Taking? Sig Documenting Provider Last Dose Status Informant  ACCU-CHEK GUIDE test strip [id number] Yes  [provider] Taking Active   albuterol (VENTOLIN HFA) 108 (90 Base) MCG/ACT inhaler [id number] Yes Inhale 2 puffs into the lungs every 4 (four) hours as needed for wheezing or shortness of breath. Uses 2-3 times daily Cannady, Henrine Screws T, NP Taking Active   atorvastatin (LIPITOR) 80 MG tablet [id number] Yes TAKE 1 TABLET(80 MG) BY MOUTH DAILY Cannady, Jolene T, NP Taking Active   Budeson-Glycopyrrol-Formoterol (BREZTRI AEROSPHERE) 160-9-4.8 MCG/ACT AERO [id number] Yes Inhale 2 puffs into the lungs 2 (two) times daily. Marnee Guarneri T, NP Taking Active   Cholecalciferol 1.25 MG (50000 UT) TABS [id number] Yes Take 1 tablet by mouth once a week. Venita Lick, NP Taking Active   clotrimazole-betamethasone (LOTRISONE) cream [id number] Yes Apply 1 Application topically 2 (two) times daily. Marnee Guarneri T, NP Taking Active   dapagliflozin propanediol (FARXIGA) 10 MG TABS tablet [id number] Yes Take 1 tablet (10 mg total) by mouth daily. Marnee Guarneri T, NP Taking Active   diltiazem (CARDIZEM CD) 180 MG 24 hr capsule [id number] Yes  Take 1 capsule (180 mg total) by mouth daily. Venita Lick, NP Taking Active   Dulaglutide (TRULICITY) [id number] SOPN [id number] Yes Inject  0.75 mg into the skin once a week. Marnee Guarneri T, NP Taking Active   ezetimibe (ZETIA) 10 MG tablet [id number] Yes Take 1 tablet (10 mg total) by mouth daily. Marnee Guarneri T, NP Taking Active   furosemide (LASIX) 20 MG tablet [id number] Yes TAKE ONE TABLET BY MOUTH EVERY MORNING and TAKE ONE TABLET BY MOUTH EVERYDAY AT BEDTIME Cannady, Jolene T, NP Taking Active   gabapentin (NEURONTIN) 600 MG tablet [id number] Yes Take 2 tablets (1,200 mg total) by mouth at bedtime. Marnee Guarneri T, NP Taking Active   isosorbide mononitrate (IMDUR) 60 MG 24 hr tablet [id number] Yes TAKE 1 TABLET(60 MG) BY MOUTH DAILY Cannady, Jolene T, NP Taking Active   metFORMIN (GLUCOPHAGE-XR) 500 MG 24 hr tablet [id number] Yes Take 1 tablet (500 mg total) by mouth in the morning and at bedtime. 1 tab bid Marnee Guarneri T, NP Taking Active   metoprolol succinate (TOPROL-XL) 50 MG 24 hr tablet [id number] Yes TAKE 1 TABLET BY MOUTH ONCE DAILY WITH OR IMMEDIATELY FOLLOWING A MEAL Cannady, Jolene T, NP Taking Active   nitroGLYCERIN (NITROSTAT) 0.4 MG SL tablet [id number] Yes Place 1 tablet (0.4 mg total) under the tongue every 5 (five) minutes as needed for chest pain. Marnee Guarneri T, NP Taking Active   pantoprazole (PROTONIX) 40 MG tablet [id number] Yes TAKE 1 TABLET(40 MG) BY MOUTH DAILY Cannady, Jolene T, NP Taking Active   ramipril (ALTACE) 10 MG capsule [id number] Yes Take 1 capsule (10 mg total) by mouth daily. Mecum, Erin E, PA-C Taking Active   rivaroxaban (XARELTO) 20 MG TABS tablet [id number] Yes TAKE ONE TABLET BY MOUTH EVERY EVENING Cannady, Barbaraann Faster, NP Taking Active             SDOH:  (Social Determinants of Health) assessments and interventions performed: Yes SDOH Interventions    Flowsheet Row Clinical Support from 03/12/2023 in [facility]  Telephone from 03/05/2023 in [facility] Telephone from 02/08/2023 in [facility] Coordination from 01/08/2023 in [facility] Management from 07/17/2022 in [facility] Office Visit from 04/03/2022 in [facility] Interventions        Food Insecurity Interventions Intervention Not Indicated Intervention Not Indicated Intervention Not Indicated -- -- --  Housing Interventions Intervention Not Indicated -- Intervention Not Indicated -- -- --  Transportation Interventions Intervention Not Indicated -- Intervention Not Indicated Intervention Not Indicated Intervention Not Indicated --  Utilities Interventions Intervention Not Indicated -- -- -- -- --  Alcohol Usage Interventions Intervention Not Indicated (Score <7) -- -- -- -- --  Depression Interventions/Treatment  -- -- -- -- -- PHQ2-9 Score <4 Follow-up Not Indicated  Financial Strain Interventions Intervention Not Indicated -- -- Other (Comment)  [PAP] Other (Comment)  [PAP] --  Physical Activity Interventions Intervention Not Indicated -- -- -- -- --  Stress Interventions Intervention Not Indicated -- -- -- -- --  Social Connections Interventions Intervention Not Indicated -- -- -- -- --       Medication Assistance: (See note)   Name and location of Current pharmacy:  [facility] - [place], [name] - [address] [name] [address] Phone: [phone] Fax: [phone]    Assessment/Plan   HypertensionCKD  (BP goal <130/80) BP Readings from Last 3 Encounters:  02/28/23 125/77  01/24/23 136/83  01/09/23 120/60    Pulse Readings  from Last 3 Encounters:  02/28/23 66  01/24/23 80  01/09/23 77   -Controlled -Current treatment: Diltiazem er 180 mg qd Appropriate, Effective, Safe, Accessible Ramipril 10 mg qd  Appropriate, Effective, Safe, Accessible Furosemide 20 mg bid Appropriate, Effective, Safe, Accessible Isosorbide Mononitrate er 60 mg qd Appropriate, Effective, Safe, Accessible Metoprolol Succ 50mg  QD Appropriate, Effective, Safe, Accessible -Medications previously tried: Carvedilol -Current home readings: 130-150/85 -Current dietary habits: Eats out most days, [place] and [place]  -Current exercise habits: No structured regimen but stays busy around his house -Denies hypotensive/hypertensive symptoms -Educated on BP goals and benefits of medications for prevention of heart attack, stroke and kidney damage; Daily salt intake goal < 2300 mg; Exercise goal of 150 minutes per week; Proper BP monitoring technique; Symptoms of hypotension and importance of maintaining adequate hydration; -Counseled to monitor BP at home daily, document, and provide log at future appointments -Counseled on diet and exercise extensively July 2023: Patient non-compliant on meds. Priority is to improve compliance and then can further assess (Though his BP has been at goal in the past) Jan 2024: Patient hasn't taken Metoprolol in months. Was sent to [facility], not [facility], and he never picked up. Sent high priority msg to PCP to see if she wants him to start again (Considering is also on Dilt) April 2024: Patient was hospitalized last week. Stated his BP went up because he got angry and yelled at [facility] callers. No meds changed and will see PCP for f/u     Hyperlipidemia/ CAD with angina: (LDL goal <70) The ASCVD Risk score (Arnett DK, et al., 2019) failed to calculate for the following reasons:   The valid total cholesterol range is 130 to 320 mg/dL Lab Results  Component Value Date   CHOL 83 (L) 01/24/2023   CHOL 87 (L) 07/04/2022   CHOL 80 (L) 04/03/2022   Lab Results  Component Value Date   HDL 31 (L) 01/24/2023   HDL 26 (L) 07/04/2022   HDL 31 (L) 04/03/2022   Lab Results  Component Value Date    LDLCALC 27 01/24/2023   LDLCALC 29 07/04/2022   LDLCALC 27 04/03/2022   Lab Results  Component Value Date   TRIG 144 01/24/2023   TRIG 200 (H) 07/04/2022   TRIG 120 04/03/2022   Lab Results  Component Value Date   CHOLHDL 2.6 04/03/2022   CHOLHDL 3.0 12/05/2021   CHOLHDL 5.5 (H) 08/30/2021   No results found for: "LDLDIRECT" Last vitamin D Lab Results  Component Value Date   VD25OH 6.7 (L) 01/24/2023   Lab Results  Component Value Date   TSH 3.720 01/24/2023  -Controlled -Current treatment: Atorvastatin 80 mg qd Appropriate, Effective, Safe, Accessible SL NTG 0.4 mg prn Appropriate, Effective, Safe, Accessible -Medications previously tried: NA --Educated on Cholesterol goals;  Benefits of statin for ASCVD risk reduction; Exercise goal of 150 minutes per week; -Counseled on diet and exercise extensively July 2023: Patient non-compliant on meds. Priority is to improve compliance and then can further assess Jan 2024: Compliance has significantly improved     Diabetes (A1c goal <7%) Lab Results  Component Value Date   HGBA1C 9.2 (H) 01/24/2023   HGBA1C 7.2 (H) 10/10/2022   HGBA1C 7.6 (H) 07/04/2022   Lab Results  Component Value Date   MICROALBUR 80 (H) 12/05/2021   LDLCALC 27 01/24/2023   CREATININE 1.22 01/24/2023    Lab Results  Component Value Date   NA 142 01/24/2023   K 3.9 01/24/2023  CREATININE 1.22 01/24/2023   EGFR 63 01/24/2023   GFRNONAA >60 04/04/2021   GLUCOSE 195 (H) 01/24/2023    Lab Results  Component Value Date   WBC 5.1 01/24/2023   HGB 13.9 01/24/2023   HCT 43.2 01/24/2023   MCV 81 01/24/2023   PLT 166 01/24/2023    Lab Results  Component Value Date   LABMICR 28.9 01/24/2023   LABMICR See below: 10/20/2019   MICROALBUR 80 (H) 12/05/2021   MICROALBUR 80 (H) 05/31/2021  -Controlled -Current medications: Metformin 500 xr bid Appropriate, Query effective, Safe, Accessible farxiga 10 mg qd Appropriate, Effective, Safe, Query  accessible 2023: PAP Approved 2024: PAP Approved Trulicity 0.75mg /week Appropriate, Query effective, Safe, Query accessible -Medications previously tried: glipizide 5 - hypoglycemia -Current home glucose readings fasting glucose:  July 2023: Patient didn't have readings on him Jan 2024: Patient hasn't tested in over a month -Denies hypoglycemic/hyperglycemic symptoms --Educated on A1c and blood sugar goals; Complications of diabetes including kidney damage, retinal damage, and cardiovascular disease; Exercise goal of 150 minutes per week; Benefits of routine self-monitoring of blood sugar; Carbohydrate counting and/or plate method -Counseled to check feet daily and get yearly eye exams -Counseled on diet and exercise extensively July 2023: PAP for Wilder Glade (Then will increase dose) Jan 2024: Patient hasn't tested in over a month. Counseled patient to start testing when he takes his medications. -Will check on PAP status of Farxiga April 2024: Patient not writing his sugars down. Will have Olivia Mackie call in a week -Trulicity is Tier 3. Will ask PCP to change to Ozepic for PAP   Atrial Fibrillation (Goal: prevent stroke and major bleeding) -Controlled -CHADSVASC: 5 -Current treatment: Rate control:  Diltiazem er 180 mg qd Appropriate, Effective, Safe, Accessible Ramipril 10 mg qd Appropriate, Effective, Safe, Accessible Furosemide 20 mg bid Appropriate, Effective, Safe, Accessible Isosorbide Mononitrate er 60 mg qd  Appropriate, Effective, Safe, Accessible Metoprolol Succ 50mg  QD Appropriate, Effective, Safe, Accessible Anticoagulation:  Xarelto 20 mg qd Appropriate, Effective, Safe, Accessible -Medications previously tried:  -Home BP and HR readings: Patient doesn't test Patient refuses CPAP (States he throws off mask in middle of night) -Counseled on increased risk of stroke due to Afib and benefits of anticoagulation for stroke prevention; importance of adherence to anticoagulant  exactly as prescribed; avoidance of NSAIDs due to increased bleeding risk with anticoagulants; importance of regular laboratory monitoring; seeking medical attention after a head injury or if there is blood in the urine/stool; -Counseled on diet and exercise extensively Recommended to continue current medication Jan 2024: Denied Xarelto PAP March 2024: Patient is taking Metoprolol again   COPD (Goal: control symptoms and prevent exacerbations) -Not ideally controlled -Current treatment  Albuterol Appropriate, Effective, Safe, Accessible Breztri Appropriate, Effective, Safe, Accessible 2023: PAP Approved 2024: awaiting status as of Jan 2024 -Medications previously tried: NA  -Gold Grade: Gold 1 (FEV1>80%) -Current COPD Classification:  B (high sx, <2 exacerbations/yr) -Pulmonary function testing:  Pulmonary Functions Testing Results: PF Readings from Last 3 Encounters:  No data found for PF       No data to display         -Exacerbations requiring treatment in last 6 months: none -Patient reports consistent use of maintenance inhaler -Frequency of rescue inhaler use: several times weekly, More SOB lately-likely multifactoral  -Counseled on Proper inhaler technique; Benefits of consistent maintenance inhaler use When to use rescue inhaler -Counseled on diet and exercise extensively July 2023: PAP symbicort Jan 2024; Will check on Breztri status March  2024: Recommend maintain therapy  CP F/U June 2024  [place], [place].D. - [phone]

## 2023-03-15 ENCOUNTER — Ambulatory Visit: Payer: Self-pay | Admitting: *Deleted

## 2023-03-15 NOTE — Patient Outreach (Signed)
  Care Coordination   Follow Up Visit Note   03/15/2023 Name: Ernest [name] MRN: [id number] DOB: June 29, 1951  Ernest [name] is a 72 y.o. year old male who sees Marnee Guarneri T, NP for primary care. I spoke with  [name] by phone today.  What matters to the patients health and wellness today?  Admitted to hospital 3/13-3/15 for angina, denies any current chest pain or discomfort.      Goals Addressed             This Visit's Progress    Effective management of DM       Care Coordination Interventions: Provided education to patient about basic DM disease process Reviewed medications with patient and discussed importance of medication adherence Discussed plans with patient for ongoing care management follow up and provided patient with direct contact information for care management team Reviewed scheduled/upcoming provider appointments including: Pharmacy call on 4/1, cardiology on 4/24, PCP on 5/8 Advised patient, providing education and rationale, to check cbg daily and record, calling PCP for findings outside established parameters Discussed current A1C of 9.2, aware of goal of less than 7 Monitoring glucose now, state it has been 98 and 117 most recently. Does not like to stick finger, advised to ask provider about Elenor Legato or Dexcom Diet discussed, including staying away from Mellow Yellow drinks and ice cream.  State he has continued to stay away from these foods, weight is down from 270 to 235 pounds           SDOH assessments and interventions completed:  No     Care Coordination Interventions:  Yes, provided   Interventions Today    Flowsheet Row Most Recent Value  Chronic Disease   Chronic disease during today's visit Diabetes  General Interventions   General Interventions Discussed/Reviewed Labs, Doctor Visits, General Interventions Reviewed  Labs Hgb A1c every 3 months  Doctor Visits Discussed/Reviewed Doctor Visits Reviewed, PCP  Exercise  Interventions   Exercise Discussed/Reviewed Physical Activity  Physical Activity Discussed/Reviewed Home Exercise Program (HEP), Physical Activity Reviewed  Education Interventions   Education Provided Provided Education  Provided Verbal Education On Nutrition, When to see the doctor  Nutrition Interventions   Nutrition Discussed/Reviewed Decreasing sugar intake, Nutrition Reviewed       Follow up plan: Follow up call scheduled for 5/9    Encounter Outcome:  Pt. Visit Completed   Valente David, RN, MSN, [place] Care Management Care Management Coordinator [phone]

## 2023-03-16 ENCOUNTER — Telehealth: Payer: Self-pay

## 2023-03-16 NOTE — Progress Notes (Signed)
Made a call to [facility] to check on delivery for [place]. The representative stated that the next refill for Wilder Glade is for 03/27/23, and Breztri refill went out on 02/28/23.  Spoke with the patient about his delivery, and also that I will prefill an application for Ozempic that will be mailed out next week.  [name]

## 2023-03-19 ENCOUNTER — Telehealth: Payer: Self-pay

## 2023-03-19 ENCOUNTER — Ambulatory Visit: Payer: Medicare HMO

## 2023-03-19 NOTE — Progress Notes (Cosign Needed)
Care Management & Coordination Services Pharmacy Team  Reason for Encounter: Medication coordination and delivery  Contacted patient to discuss medications and coordinate delivery from [facility]. Spoke with patient on 03/19/2023    Medications: Outpatient Encounter Medications as of 03/19/2023  Medication Sig   ACCU-CHEK GUIDE test strip    albuterol (VENTOLIN HFA) 108 (90 Base) MCG/ACT inhaler Inhale 2 puffs into the lungs every 4 (four) hours as needed for wheezing or shortness of breath. Uses 2-3 times daily   atorvastatin (LIPITOR) 80 MG tablet TAKE 1 TABLET(80 MG) BY MOUTH DAILY   Budeson-Glycopyrrol-Formoterol (BREZTRI AEROSPHERE) 160-9-4.8 MCG/ACT AERO Inhale 2 puffs into the lungs 2 (two) times daily.   Cholecalciferol 1.25 MG (50000 UT) TABS Take 1 tablet by mouth once a week.   clotrimazole-betamethasone (LOTRISONE) cream Apply 1 Application topically 2 (two) times daily.   dapagliflozin propanediol (FARXIGA) 10 MG TABS tablet Take 1 tablet (10 mg total) by mouth daily.   diltiazem (CARDIZEM CD) 180 MG 24 hr capsule Take 1 capsule (180 mg total) by mouth daily.   ezetimibe (ZETIA) 10 MG tablet Take 1 tablet (10 mg total) by mouth daily.   furosemide (LASIX) 20 MG tablet TAKE ONE TABLET BY MOUTH EVERY MORNING and TAKE ONE TABLET BY MOUTH EVERYDAY AT BEDTIME   gabapentin (NEURONTIN) 600 MG tablet Take 2 tablets (1,200 mg total) by mouth at bedtime.   isosorbide mononitrate (IMDUR) 60 MG 24 hr tablet TAKE 1 TABLET(60 MG) BY MOUTH DAILY   metFORMIN (GLUCOPHAGE-XR) 500 MG 24 hr tablet Take 1 tablet (500 mg total) by mouth in the morning and at bedtime. 1 tab bid   metoprolol succinate (TOPROL-XL) 50 MG 24 hr tablet TAKE 1 TABLET BY MOUTH ONCE DAILY WITH OR IMMEDIATELY FOLLOWING A MEAL   nitroGLYCERIN (NITROSTAT) 0.4 MG SL tablet Place 1 tablet (0.4 mg total) under the tongue every 5 (five) minutes as needed for chest pain.   pantoprazole (PROTONIX) 40 MG tablet TAKE 1 TABLET(40  MG) BY MOUTH DAILY   ramipril (ALTACE) 10 MG capsule Take 1 capsule (10 mg total) by mouth daily.   rivaroxaban (XARELTO) 20 MG TABS tablet TAKE ONE TABLET BY MOUTH EVERY EVENING   No facility-administered encounter medications on file as of 03/19/2023.   BP Readings from Last 3 Encounters:  03/14/23 131/81  02/28/23 125/77  01/24/23 136/83    Pulse Readings from Last 3 Encounters:  03/14/23 80  02/28/23 66  01/24/23 80    Lab Results  Component Value Date/Time   HGBA1C 9.2 (H) 01/24/2023 12:00 AM   HGBA1C 7.2 (H) 10/10/2022 11:50 AM   HGBA1C 7.6 (H) 07/04/2022 11:47 AM   HGBA1C 7.5 (H) 07/25/2014 04:25 AM   Lab Results  Component Value Date   CREATININE 1.22 01/24/2023   BUN 11 01/24/2023   GFRNONAA >60 04/04/2021   GFRAA 81 11/19/2020   NA 142 01/24/2023   K 3.9 01/24/2023   CALCIUM 9.3 01/24/2023   CO2 24 01/24/2023  Cycle dispensing form sent to [facility] for review.   Last adherence delivery date: 12/21/22  Patient is due for next adherence delivery on: 03/28/23  This delivery to include: Adherence Packaging  30 Days  Diltiazem 180 mg once daily Ezetimibe 10 mg once daily Metformin ER 500 mg twice daily Isosorbide Mono ER 60 mg once daily Atorvastatin 80 mg once daily Ramipril 10 mg once daily Gabapentin 600 mg twice at bedtime Xarelto 20 mg twice daily Furosemide 20 mg twice daily Metoprolol ER  50 mg once daily Pantoprazole 40 mg once daily Vitmatin D3 50000 units once weekly Trulicity [id number] once weekly Albuterol HFA 2 puff every 4 hour prn  Patient declined the following medications this month: None noted  Any concerns about your medications? No  How often do you forget or accidentally miss a dose? Never  Do you use a pillbox? No  Is patient in packaging Yes  If yes  What is the date on your next pill pack?  Any concerns or issues with your packaging? Patient could not give dates due to him being busy at the moment.   Corrie Mckusick,  RMA

## 2023-03-22 ENCOUNTER — Other Ambulatory Visit: Payer: Self-pay | Admitting: Nurse Practitioner

## 2023-03-22 DIAGNOSIS — G2581 Restless legs syndrome: Secondary | ICD-10-CM

## 2023-03-22 NOTE — Telephone Encounter (Signed)
Trulicity not on list.   It was discontinued 03/14/2023.  Requested Prescriptions  Pending Prescriptions Disp Refills   gabapentin (NEURONTIN) 600 MG tablet [Pharmacy Med Name: gabapentin 600 mg tablet] 180 tablet 1    Sig: TAKE TWO TABLETS BY MOUTH EVERYDAY AT BEDTIME     Neurology: Anticonvulsants - gabapentin Passed - 03/22/2023  1:07 PM      Passed - Cr in normal range and within 360 days    Creatinine  Date Value Ref Range Status  07/25/2014 1.21 0.60 - 1.30 mg/dL Final   Creatinine, Ser  Date Value Ref Range Status  01/24/2023 1.22 0.76 - 1.27 mg/dL Final         Passed - Completed PHQ-2 or PHQ-9 in the last 360 days      Passed - Valid encounter within last 12 months    Recent Outpatient Visits           1 week ago Unstable angina ([place])   [place], Henrine Screws T, NP   3 weeks ago Longstanding persistent atrial fibrillation ([place])   [place], [place] T, NP   1 month ago Type 2 diabetes mellitus with morbid obesity ([place])   [place], Delta T, NP   5 months ago Type 2 diabetes mellitus with morbid obesity ([place])   [place] Mecum, Erin E, PA-C   8 months ago Type 2 diabetes mellitus with morbid obesity ([place])   [place] Kathrine Haddock, NP       Future Appointments             In 2 weeks Dunn, Areta Haber, PA-C [place] at [place]   In 1 month Annetta, Barbaraann Faster, NP [place], [place]   In 1 month [place], Hull T, NP [place], [facility]             atorvastatin (LIPITOR) 80 MG tablet [Pharmacy Med Name: atorvastatin 80 mg tablet] 90 tablet 1    Sig: TAKE ONE TABLET BY MOUTH EVERY MORNING     Cardiovascular:  Antilipid - Statins Failed - 03/22/2023  1:07 PM      Failed - Lipid Panel in normal range within the last 12 months    Cholesterol, Total  Date  Value Ref Range Status  01/24/2023 83 (L) 100 - 199 mg/dL Final   Cholesterol  Date Value Ref Range Status  07/25/2014 104 0 - 200 mg/dL Final   Cholesterol Piccolo, Waived  Date Value Ref Range Status  08/01/2017 195 <200 mg/dL Final    Comment:                            Desirable                <200                         Borderline High      200- 239                         High                     >239    Ldl Cholesterol, Calc  Date Value Ref Range Status  07/25/2014 30 0 -  100 mg/dL Final   LDL Chol Calc (NIH)  Date Value Ref Range Status  01/24/2023 27 0 - 99 mg/dL Final   HDL Cholesterol  Date Value Ref Range Status  07/25/2014 25 (L) 40 - 60 mg/dL Final   HDL  Date Value Ref Range Status  01/24/2023 31 (L) >39 mg/dL Final   Triglycerides  Date Value Ref Range Status  01/24/2023 144 0 - 149 mg/dL Final  07/25/2014 243 (H) 0 - 200 mg/dL Final   Triglyceride fasting, serum  Date Value Ref Range Status  05/27/2010 249 mg/dL    Triglycerides Piccolo,Waived  Date Value Ref Range Status  08/01/2017 268 (H) <150 mg/dL Final    Comment:                            Normal                   <150                         Borderline High     150 - 199                         High                200 - 499                         Very High                >499          Passed - Patient is not pregnant      Passed - Valid encounter within last 12 months    Recent Outpatient Visits           1 week ago Unstable angina ([place])   [place], Baxter T, NP   3 weeks ago Longstanding persistent atrial fibrillation ([place])   [place] Steward, Lusk T, NP   1 month ago Type 2 diabetes mellitus with morbid obesity ([place])   [place], [place] T, NP   5 months ago Type 2 diabetes mellitus with morbid obesity ([place])   [place] [facility] Mecum, Erin E, PA-C   8  months ago Type 2 diabetes mellitus with morbid obesity ([place])   [place] Kathrine Haddock, NP       Future Appointments             In 2 weeks Dunn, Areta Haber, PA-C [place] [facility] [place]   In 1 month [place], Barbaraann Faster, NP [place], [facility]   In 1 month [place], [place] T, NP [place], [facility]             isosorbide mononitrate (IMDUR) 60 MG 24 hr tablet [Pharmacy Med Name: isosorbide mononitrate ER 60 mg tablet,extended release 24 hr] 90 tablet 1    Sig: TAKE ONE TABLET BY MOUTH EVERY MORNING     Cardiovascular:  Nitrates Passed - 03/22/2023  1:07 PM      Passed - Last BP in normal range    BP Readings from Last 1 Encounters:  03/14/23 131/81         Passed - Last Heart Rate in normal  range    Pulse Readings from Last 1 Encounters:  03/14/23 80         Passed - Valid encounter within last 12 months    Recent Outpatient Visits           1 week ago Unstable angina ([place])   [place], Rhome T, NP   3 weeks ago Longstanding persistent atrial fibrillation ([place])   [place], [place] T, NP   1 month ago Type 2 diabetes mellitus with morbid obesity ([place])   [place], Johnson T, NP   5 months ago Type 2 diabetes mellitus with morbid obesity ([place])   [place], Erin E, PA-C   8 months ago Type 2 diabetes mellitus with morbid obesity ([place])   [place] Kathrine Haddock, NP       Future Appointments             In 2 weeks Dunn, Areta Haber, PA-C [place] at [place]   In 1 month [place], Barbaraann Faster, NP [place], [place]   In 1 month [place], [place] T, NP [place], [facility]             TRULICITY [id number] SOPN [Pharmacy Med Name: Trulicity [id number] mL subcutaneous pen  injector] 2 mL 1    Sig: Inject 0.75 mg into the skin once a week.     Endocrinology:  Diabetes - GLP-1 Receptor Agonists Failed - 03/22/2023  1:07 PM      Failed - HBA1C is between 0 and 7.9 and within 180 days    Hemoglobin A1C  Date Value Ref Range Status  07/25/2014 7.5 (H) 4.2 - 6.3 % Final    Comment:    The American Diabetes Association recommends that a primary goal of therapy should be <7% and that physicians should reevaluate the treatment regimen in patients with HbA1c values consistently >8%.    HB A1C (BAYER DCA - WAIVED)  Date Value Ref Range Status  10/10/2022 7.2 (H) 4.8 - 5.6 % Final    Comment:             Prediabetes: 5.7 - 6.4          Diabetes: >6.4          Glycemic control for adults with diabetes: <7.0    Hgb A1c MFr Bld  Date Value Ref Range Status  01/24/2023 9.2 (H) 4.8 - 5.6 % Final    Comment:             Prediabetes: 5.7 - 6.4          Diabetes: >6.4          Glycemic control for adults with diabetes: <7.0          Passed - Valid encounter within last 6 months    Recent Outpatient Visits           1 week ago Unstable angina ([place])   [place], Drexel T, NP   3 weeks ago Longstanding persistent atrial fibrillation ([place])   [place], [place] Creek T, NP   1 month ago Type 2 diabetes mellitus with morbid obesity ([place])   [place], Richton T, NP   5 months ago Type 2 diabetes mellitus with morbid obesity ([place])   [facility] Mecum, Erin E, PA-C   8 months ago Type 2 diabetes mellitus with morbid obesity ([place])   [place] Kathrine Haddock, NP       Future Appointments             In 2 weeks Dunn, Areta Haber, PA-C [place] [facility] [place]   In 1 month [place], Barbaraann Faster, NP [place], [place]   In 1 month [place], Barbaraann Faster, NP [place], [facility]

## 2023-03-23 ENCOUNTER — Other Ambulatory Visit: Payer: Self-pay | Admitting: Nurse Practitioner

## 2023-03-23 NOTE — Telephone Encounter (Signed)
Requested by interface surescripts. Medication discontinued 03/14/23 completed course.  Requested Prescriptions  Refused Prescriptions Disp Refills   TRULICITY 0.75 MG/0.5ML SOPN [Pharmacy Med Name: Trulicity 0.75 mg/0.5 mL subcutaneous pen injector] 2 mL 1    Sig: Inject 0.75 mg into the skin once a week.     Endocrinology:  Diabetes - GLP-1 Receptor Agonists Failed - 03/23/2023  9:14 AM      Failed - HBA1C is between 0 and 7.9 and within 180 days    Hemoglobin A1C  Date Value Ref Range Status  07/25/2014 7.5 (H) 4.2 - 6.3 % Final    Comment:    The American Diabetes Association recommends that a primary goal of therapy should be <7% and that physicians should reevaluate the treatment regimen in patients with HbA1c values consistently >8%.    HB A1C (BAYER DCA - WAIVED)  Date Value Ref Range Status  10/10/2022 7.2 (H) 4.8 - 5.6 % Final    Comment:             Prediabetes: 5.7 - 6.4          Diabetes: >6.4          Glycemic control for adults with diabetes: <7.0    Hgb A1c MFr Bld  Date Value Ref Range Status  01/24/2023 9.2 (H) 4.8 - 5.6 % Final    Comment:             Prediabetes: 5.7 - 6.4          Diabetes: >6.4          Glycemic control for adults with diabetes: <7.0          Passed - Valid encounter within last 6 months    Recent Outpatient Visits           1 week ago Unstable angina (HCC)   [place] [facility] Vandiver, Comp[place] T, NP   3 weeks ago Longstanding persistent atrial fibrillation (HCC)   [place] [facility] [place], [place] T, NP   1 month ago Type 2 diabetes mellitus with morbid obesity (HCC)   [facility] [facility] [place], Addison T, NP   5 months ago Type 2 diabetes mellitus with morbid obesity (HCC)   [place] [facility] Mecum, Erin E, PA-C   8 months ago Type 2 diabetes mellitus with morbid obesity (HCC)   [place] [facility] Gabriel Cirri, NP       Future  Appointments             In 2 weeks Dunn, Raymon Mutton, PA-C [facility] [place]   In 1 month [place], Dorie Rank, NP [place] [facility]   In 1 month [place], Dorie Rank, NP [place] [facility], [facility]
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
## 2023-04-11 ENCOUNTER — Ambulatory Visit: Payer: Medicare HMO | Attending: Physician Assistant | Admitting: Physician Assistant

## 2023-04-11 ENCOUNTER — Encounter: Payer: Self-pay | Admitting: Physician Assistant

## 2023-04-11 VITALS — BP 120/64 | HR 67 | Ht 70.0 in | Wt 235.0 lb

## 2023-04-11 DIAGNOSIS — E785 Hyperlipidemia, unspecified: Secondary | ICD-10-CM

## 2023-04-11 DIAGNOSIS — I10 Essential (primary) hypertension: Secondary | ICD-10-CM | POA: Diagnosis not present

## 2023-04-11 DIAGNOSIS — I2510 Atherosclerotic heart disease of native coronary artery without angina pectoris: Secondary | ICD-10-CM

## 2023-04-11 DIAGNOSIS — R0602 Shortness of breath: Secondary | ICD-10-CM | POA: Diagnosis not present

## 2023-04-11 DIAGNOSIS — I4821 Permanent atrial fibrillation: Secondary | ICD-10-CM | POA: Diagnosis not present

## 2023-04-11 NOTE — Patient Instructions (Signed)
Medication Instructions:  No changes at this time.   *If you need a refill on your cardiac medications before your next appointment, please call your pharmacy*   Lab Work: None  If you have labs (blood work) drawn today and your tests are completely normal, you will receive your results only by: MyChart Message (if you have MyChart) OR A paper copy in the mail If you have any lab test that is abnormal or we need to change your treatment, we will call you to review the results.   Testing/Procedures: None   Follow-Up: At [facility], you and your health needs are our priority.  As part of our continuing mission to provide you with exceptional heart care, we have created designated Provider Care Teams.  These Care Teams include your primary Cardiologist (physician) and Advanced Practice Providers (APPs -  Physician Assistants and Nurse Practitioners) who all work together to provide you with the care you need, when you need it.  We recommend signing up for the patient portal called "MyChart".  Sign up information is provided on this After Visit Summary.  MyChart is used to connect with patients for Virtual Visits (Telemedicine).  Patients are able to view lab/test results, encounter notes, upcoming appointments, etc.  Non-urgent messages can be sent to your provider as well.   To learn more about what you can do with MyChart, go to [email or web address].    Your next appointment:   6 month(s)  Provider:   Julien Nordmann, MD or Eula Listen, PA-C    Other Instructions Referral placed to see pulmonary. Number is [phone]

## 2023-04-12 ENCOUNTER — Other Ambulatory Visit: Payer: Self-pay | Admitting: Physician Assistant

## 2023-04-12 DIAGNOSIS — I152 Hypertension secondary to endocrine disorders: Secondary | ICD-10-CM

## 2023-04-12 NOTE — Telephone Encounter (Signed)
Requested Prescriptions  Pending Prescriptions Disp Refills   ramipril (ALTACE) 10 MG capsule [Pharmacy Med Name: ramipril 10 mg capsule] 90 capsule 1    Sig: TAKE ONE CAPSULE BY MOUTH ONCE DAILY     Cardiovascular:  ACE Inhibitors Passed - 04/12/2023  8:02 AM      Passed - Cr in normal range and within 180 days    Creatinine  Date Value Ref Range Status  07/25/2014 1.21 0.60 - 1.30 mg/dL Final   Creatinine, Ser  Date Value Ref Range Status  01/24/2023 1.22 0.76 - 1.27 mg/dL Final         Passed - K in normal range and within 180 days    Potassium  Date Value Ref Range Status  01/24/2023 3.9 3.5 - 5.2 mmol/L Final  07/25/2014 3.7 3.5 - 5.1 mmol/L Final         Passed - Patient is not pregnant      Passed - Last BP in normal range    BP Readings from Last 1 Encounters:  04/11/23 120/64         Passed - Valid encounter within last 6 months    Recent Outpatient Visits           4 weeks ago Unstable angina (HCC)   [facility] [place], Corrie Dandy T, NP   1 month ago Longstanding persistent atrial fibrillation (HCC)   [place] [place], Hugo T, NP   2 months ago Type 2 diabetes mellitus with morbid obesity (HCC)   [facility] [place], [place] T, NP   6 months ago Type 2 diabetes mellitus with morbid obesity (HCC)   [place] [facility] [place], Erin E, PA-C   9 months ago Type 2 diabetes mellitus with morbid obesity (HCC)   [facility] [place] Cirri, NP       Future Appointments             In 1 week [place], Dorie Rank, NP  [facility], [facility]   In 1 week [place], Dorie Rank, NP  [facility], [facility]

## 2023-04-24 ENCOUNTER — Ambulatory Visit: Payer: Medicare HMO | Admitting: Nurse Practitioner

## 2023-04-24 DIAGNOSIS — I700 Atherosclerosis of aorta: Secondary | ICD-10-CM

## 2023-04-24 DIAGNOSIS — I25111 Atherosclerotic heart disease of native coronary artery with angina pectoris with documented spasm: Secondary | ICD-10-CM

## 2023-04-24 DIAGNOSIS — E538 Deficiency of other specified B group vitamins: Secondary | ICD-10-CM

## 2023-04-24 DIAGNOSIS — I152 Hypertension secondary to endocrine disorders: Secondary | ICD-10-CM

## 2023-04-24 DIAGNOSIS — E1169 Type 2 diabetes mellitus with other specified complication: Secondary | ICD-10-CM

## 2023-04-24 DIAGNOSIS — I200 Unstable angina: Secondary | ICD-10-CM

## 2023-04-24 DIAGNOSIS — N1831 Chronic kidney disease, stage 3a: Secondary | ICD-10-CM

## 2023-04-24 DIAGNOSIS — G4733 Obstructive sleep apnea (adult) (pediatric): Secondary | ICD-10-CM

## 2023-04-24 DIAGNOSIS — E559 Vitamin D deficiency, unspecified: Secondary | ICD-10-CM

## 2023-04-24 DIAGNOSIS — I4811 Longstanding persistent atrial fibrillation: Secondary | ICD-10-CM

## 2023-04-24 DIAGNOSIS — J439 Emphysema, unspecified: Secondary | ICD-10-CM

## 2023-04-25 ENCOUNTER — Ambulatory Visit: Payer: Medicare HMO | Admitting: Nurse Practitioner

## 2023-04-25 ENCOUNTER — Encounter: Payer: Self-pay | Admitting: Nurse Practitioner

## 2023-04-25 ENCOUNTER — Ambulatory Visit (INDEPENDENT_AMBULATORY_CARE_PROVIDER_SITE_OTHER): Payer: Medicare HMO | Admitting: Nurse Practitioner

## 2023-04-25 ENCOUNTER — Telehealth: Payer: Self-pay

## 2023-04-25 DIAGNOSIS — I700 Atherosclerosis of aorta: Secondary | ICD-10-CM

## 2023-04-25 DIAGNOSIS — E1159 Type 2 diabetes mellitus with other circulatory complications: Secondary | ICD-10-CM

## 2023-04-25 DIAGNOSIS — E785 Hyperlipidemia, unspecified: Secondary | ICD-10-CM | POA: Diagnosis not present

## 2023-04-25 DIAGNOSIS — E6609 Other obesity due to excess calories: Secondary | ICD-10-CM | POA: Diagnosis not present

## 2023-04-25 DIAGNOSIS — J439 Emphysema, unspecified: Secondary | ICD-10-CM | POA: Diagnosis not present

## 2023-04-25 DIAGNOSIS — I200 Unstable angina: Secondary | ICD-10-CM | POA: Diagnosis not present

## 2023-04-25 DIAGNOSIS — E1169 Type 2 diabetes mellitus with other specified complication: Secondary | ICD-10-CM | POA: Diagnosis not present

## 2023-04-25 DIAGNOSIS — N1831 Chronic kidney disease, stage 3a: Secondary | ICD-10-CM

## 2023-04-25 DIAGNOSIS — E538 Deficiency of other specified B group vitamins: Secondary | ICD-10-CM

## 2023-04-25 DIAGNOSIS — I4811 Longstanding persistent atrial fibrillation: Secondary | ICD-10-CM

## 2023-04-25 DIAGNOSIS — E559 Vitamin D deficiency, unspecified: Secondary | ICD-10-CM

## 2023-04-25 DIAGNOSIS — G4733 Obstructive sleep apnea (adult) (pediatric): Secondary | ICD-10-CM | POA: Diagnosis not present

## 2023-04-25 DIAGNOSIS — I152 Hypertension secondary to endocrine disorders: Secondary | ICD-10-CM

## 2023-04-25 LAB — BAYER DCA HB A1C WAIVED: HB A1C (BAYER DCA - WAIVED): 6.3 % — ABNORMAL HIGH (ref 4.8–5.6)

## 2023-04-25 MED ORDER — OZEMPIC (0.25 OR 0.5 MG/DOSE) 2 MG/3ML ~~LOC~~ SOPN: 0.5000 mg | PEN_INJECTOR | SUBCUTANEOUS | 4 refills | Status: DC

## 2023-04-25 MED ORDER — CLOTRIMAZOLE-BETAMETHASONE 1-0.05 % EX CREA: 1.0000 | TOPICAL_CREAM | Freq: Two times a day (BID) | CUTANEOUS | 2 refills | Status: DC

## 2023-04-25 NOTE — Assessment & Plan Note (Signed)
On past labs, recheck today and continue supplement at home.

## 2023-04-25 NOTE — Assessment & Plan Note (Signed)
Chronic, ongoing.  Continue current medication regimen and adjust as needed. Lipid panel today. 

## 2023-04-25 NOTE — Assessment & Plan Note (Signed)
Stable with no pain at this time.  Will continue to collaborate with cardiology and continue current medication regimen as ordered by them.  He is aware of red flags and reasons to immediately go to ER.

## 2023-04-25 NOTE — Telephone Encounter (Cosign Needed)
Received msg from PCP about if patient was on Ozempic/Trulicity. We started PAP on 03/19/23. Sent direct msg to PCP. She stated patient was taking Ozempic and preferred this  I assumed patient had gotten approved for PAP based on the information that he had Ozempic and we had mailed it out.  After calling patient, he told me he got it as a sample from the office. I am unsure as to the timeline as to when this happened. He also stated he never got the forms in the mail. I asked him if he remembered [place] talking about it and why he didn't call and he stated, "Didn't worry about it until now."  [facility] [place], there are no samples of Ozempic.  Will re-mail out PAP immediately

## 2023-04-25 NOTE — Assessment & Plan Note (Signed)
BMI 33.33 with loss present, underlying T2DM, HTN/HLD.  Recommended eating smaller high protein, low fat meals more frequently and exercising 30 mins a day 5 times a week with a goal of 10-15lb weight loss in the next 3 months. Patient voiced their understanding and motivation to adhere to these recommendations.

## 2023-04-25 NOTE — Assessment & Plan Note (Signed)
Chronic, ongoing.  A1c today 6.3%, major trend down with GLP1 on board and has had weight loss of 10 pounds. Check urine ALB 30 February 2024, continue Ramipril for kidney protection.  Check BS 2-3 times a day, he is noticing a down trend in sugar levels.  Recommend heavy focus on diabetic diet and maintain current regimen at this time.  Return in 3 months. Praised for this success.  Continue to work with CCM team. - ACE and statin on board - Eye and foot exam up to date - Vaccines up to date

## 2023-04-25 NOTE — Patient Instructions (Signed)
Diabetes Mellitus Basics  Diabetes mellitus, or diabetes, is a long-term (chronic) disease. It occurs when the body does not properly use sugar (glucose) that is released from food after you eat. Diabetes mellitus may be caused by one or both of these problems: Your pancreas does not make enough of a hormone called insulin. Your body does not react in a normal way to the insulin that it makes. Insulin lets glucose enter cells in your body. This gives you energy. If you have diabetes, glucose cannot get into cells. This causes high blood glucose (hyperglycemia). How to treat and manage diabetes You may need to take insulin or other diabetes medicines daily to keep your glucose in balance. If you are prescribed insulin, you will learn how to give yourself insulin by injection. You may need to adjust the amount of insulin you take based on the foods that you eat. You will need to check your blood glucose levels using a glucose monitor as told by your health care provider. The readings can help determine if you have low or high blood glucose. Generally, you should have these blood glucose levels: Before meals (preprandial): 80-130 mg/dL (4.4-7.2 mmol/L). After meals (postprandial): below 180 mg/dL (10 mmol/L). Hemoglobin A1c (HbA1c) level: less than 7%. Your health care provider will set treatment goals for you. Keep all follow-up visits. This is important. Follow these instructions at home: Diabetes medicines Take your diabetes medicines every day as told by your health care provider. List your diabetes medicines here: Name of medicine: ______________________________ Amount (dose): _______________ Time (a.m./p.m.): _______________ Notes: ___________________________________ Name of medicine: ______________________________ Amount (dose): _______________ Time (a.m./p.m.): _______________ Notes: ___________________________________ Name of medicine: ______________________________ Amount (dose):  _______________ Time (a.m./p.m.): _______________ Notes: ___________________________________ Insulin If you use insulin, list the types of insulin you use here: Insulin type: ______________________________ Amount (dose): _______________ Time (a.m./p.m.): _______________Notes: ___________________________________ Insulin type: ______________________________ Amount (dose): _______________ Time (a.m./p.m.): _______________ Notes: ___________________________________ Insulin type: ______________________________ Amount (dose): _______________ Time (a.m./p.m.): _______________ Notes: ___________________________________ Insulin type: ______________________________ Amount (dose): _______________ Time (a.m./p.m.): _______________ Notes: ___________________________________ Insulin type: ______________________________ Amount (dose): _______________ Time (a.m./p.m.): _______________ Notes: ___________________________________ Managing blood glucose  Check your blood glucose levels using a glucose monitor as told by your health care provider. Write down the times that you check your glucose levels here: Time: _______________ Notes: ___________________________________ Time: _______________ Notes: ___________________________________ Time: _______________ Notes: ___________________________________ Time: _______________ Notes: ___________________________________ Time: _______________ Notes: ___________________________________ Time: _______________ Notes: ___________________________________  Low blood glucose Low blood glucose (hypoglycemia) is when glucose is at or below 70 mg/dL (3.9 mmol/L). Symptoms may include: Feeling: Hungry. Sweaty and clammy. Irritable or easily upset. Dizzy. Sleepy. Having: A fast heartbeat. A headache. A change in your vision. Numbness around the mouth, lips, or tongue. Having trouble with: Moving (coordination). Sleeping. Treating low blood glucose To treat low blood  glucose, eat or drink something containing sugar right away. If you can think clearly and swallow safely, follow the 15:15 rule: Take 15 grams of a fast-acting carb (carbohydrate), as told by your health care provider. Some fast-acting carbs are: Glucose tablets: take 3-4 tablets. Hard candy: eat 3-5 pieces. Fruit juice: drink 4 oz (120 mL). Regular (not diet) soda: drink 4-6 oz (120-180 mL). Honey or sugar: eat 1 Tbsp (15 mL). Check your blood glucose levels 15 minutes after you take the carb. If your glucose is still at or below 70 mg/dL (3.9 mmol/L), take 15 grams of a carb again. If your glucose does not go above 70 mg/dL (3.9 mmol/L) after   3 tries, get help right away. After your glucose goes back to normal, eat a meal or a snack within 1 hour. Treating very low blood glucose If your glucose is at or below 54 mg/dL (3 mmol/L), you have very low blood glucose (severe hypoglycemia). This is an emergency. Do not wait to see if the symptoms will go away. Get medical help right away. Call your local emergency services (911 in the [place] Do not drive yourself to the hospital. Questions to ask your health care provider Should I talk with a diabetes educator? What equipment will I need to care for myself at home? What diabetes medicines do I need? When should I take them? How often do I need to check my blood glucose levels? What number can I call if I have questions? When is my follow-up visit? Where can I find a support group for people with diabetes? Where to find more information American Diabetes Association: www.diabetes.org Association of Diabetes Care and Education Specialists: www.diabeteseducator.org Contact a health care provider if: Your blood glucose is at or above 240 mg/dL (13.3 mmol/L) for 2 days in a row. You have been sick or have had a fever for 2 days or more, and you are not getting better. You have any of these problems for more than 6 hours: You cannot eat or  drink. You feel nauseous. You vomit. You have diarrhea. Get help right away if: Your blood glucose is lower than 54 mg/dL (3 mmol/L). You get confused. You have trouble thinking clearly. You have trouble breathing. These symptoms may represent a serious problem that is an emergency. Do not wait to see if the symptoms will go away. Get medical help right away. Call your local emergency services (911 in the [place] Do not drive yourself to the hospital. Summary Diabetes mellitus is a chronic disease that occurs when the body does not properly use sugar (glucose) that is released from food after you eat. Take insulin and diabetes medicines as told. Check your blood glucose every day, as often as told. Keep all follow-up visits. This is important. This information is not intended to replace advice given to you by your health care provider. Make sure you discuss any questions you have with your health care provider. Document Revised: 04/06/2020 Document Reviewed: 04/06/2020 Elsevier Patient Education  2023 Elsevier Inc.  

## 2023-04-25 NOTE — Assessment & Plan Note (Signed)
Chronic, ongoing, scheduled to see pulmonary upcoming which will be beneficial.  Levels >80%.  Continue current inhaler regimen and adjust as needed.  Recommend he consistently use his Breztri and not miss doses as may benefit his SOB, educated him on this.

## 2023-04-25 NOTE — Assessment & Plan Note (Signed)
Chronic, no use of CPAP.  Recommend he use 100% of the time. 

## 2023-04-25 NOTE — Assessment & Plan Note (Signed)
Chronic, ongoing.  Rate controlled.  Continue collaboration with cardiology and current medication regimen as ordered by them. 

## 2023-04-25 NOTE — Progress Notes (Signed)
Care Management & Coordination Services Pharmacy Team  Reason for Encounter: Patient assistance    Currently enrolled patient in [employer] patient assistance program for the medication Ozempic. Prefilled application for the patient and will mail out on 05/04/23, per patient. Patient states that he will be going to the [facility] and would like to wait to have PAP mailed out. Also gave instructions to the patient on completing the application.  Velvet Bathe

## 2023-04-25 NOTE — Assessment & Plan Note (Signed)
Chronic, ongoing.  Noted on imaging 03/05/18.  Continue statin therapy and Eliquis for prevention.

## 2023-04-25 NOTE — Assessment & Plan Note (Signed)
Chronic, ongoing - recent labs normal range.  At this time continue Ramipril for kidney protection.  CMP today.

## 2023-04-25 NOTE — Progress Notes (Signed)
BP 129/70   Pulse 90   Temp 98 F (36.7 C) (Oral)   Ht 5\' 10"  (1.778 m)   Wt 232 lb 4.8 oz (105.4 kg)   SpO2 98%   BMI 33.33 kg/m    Subjective:    Patient ID: Ernest [name], male    DOB: 1951/10/22, 72 y.o.   MRN: [id number]  HPI: Ernest [name] is a 72 y.o. male  Chief Complaint  Patient presents with   Diabetes   Hypertension   Hyperlipidemia   Atrial Fibrillation   COPD   DIABETES A1c last March 7.3%.  Continues on Metformin XR 500 MG BID, Ozempic 0.5 MG weekly (prefers this one vs Trulicity as easier injection), and Farxiga 10 MG daily.  Higher doses of Metformin cause nausea.  Working with CCM PharmD.   Taking Gabapentin 1200 MG for RLS at night, without it can not sleep.  History of low B12, is taking supplement. Hypoglycemic episodes:no Polydipsia/polyuria: no Visual disturbance: no Chest pain: no Paresthesias: no Glucose Monitoring: yes  Accucheck frequency: occasionally  Fasting glucose: 155 last night  Post prandial:  Evening:  Before meals: Taking Insulin?: no  Long acting insulin:  Short acting insulin: Blood Pressure Monitoring: weekly Retinal Examination: Up to Date -- [employer] Exam: Up to Date Pneumovax: Up to Date Influenza: Up to Date Aspirin: no   HYPERTENSION / HYPERLIPIDEMIA Follows with cardiology, last seen 04/11/23 -- was referred to pulmonary as overall cardiac exam reassuring, but continues to have SOB with exertion -- sees them on 24th.  Continues on Imdur, Lasix, Cardizem, Zetia, Atorvastatin, Ramipril.  Has CPAP at home, but does not use as was waking up with it on floor all the time -- even strapping to his head and it would be off face. Satisfied with current treatment? yes Duration of hypertension: chronic BP monitoring frequency: weekly BP range: <130/80 range at home BP medication side effects: no Duration of hyperlipidemia: chronic Cholesterol medication side effects: no Cholesterol supplements:  none Medication compliance: good compliance Aspirin: no Recent stressors: no Recurrent headaches: no Visual changes: no Palpitations: no Dyspnea: occasional with exertion Chest pain: no Lower extremity edema: no Dizzy/lightheaded:with changing positions at times --   ATRIAL FIBRILLATION Continues on Xarelto and Metoprolol. Atrial fibrillation status: stable Satisfied with current treatment: yes  Medication side effects:  no Medication compliance: good compliance Etiology of atrial fibrillation:  Palpitations:   occasional Chest pain:  no Dyspnea on exertion:  no Orthopnea:  no Syncope:  no Edema:  no Ventricular rate control: B-blocker Anti-coagulation: long acting   CHRONIC KIDNEY DISEASE Recent labs stable on 03/02/23. CKD status: stable Medications renally dose: yes Previous renal evaluation: no Pneumovax:  Up to Date Influenza Vaccine:  Up to Date   COPD Continues on Breztri and Albuterol -- does not use Breztri twice a day as instructed because he is busy and does not have time to rinse mouth out. Was a smoker 30 years ago, smoked about 4 PPD -- went and got hypnotized and quit. COPD status: stable Satisfied with current treatment?: yes Oxygen use: no Dyspnea frequency: no Cough frequency: no Rescue inhaler frequency:   Limitation of activity: no Productive cough: no Last Spirometry: 07/04/22 --- FEV1/FVC >80% and FEV1 similar Pneumovax: Up to Date Influenza: Up to Date   Relevant past medical, surgical, family and social history reviewed and updated as indicated. Interim medical history since our last visit reviewed. Allergies and medications reviewed and updated.  Review of  Systems  Constitutional:  Negative for activity change, diaphoresis, fatigue and fever.  Respiratory:  Negative for cough, chest tightness, shortness of breath and wheezing.   Cardiovascular:  Negative for chest pain, palpitations and leg swelling.  Gastrointestinal: Negative.    Endocrine: Negative for cold intolerance, heat intolerance, polydipsia, polyphagia and polyuria.  Neurological: Negative.   Psychiatric/Behavioral: Negative.     Per HPI unless specifically indicated above     Objective:    BP 129/70   Pulse 90   Temp 98 F (36.7 C) (Oral)   Ht 5\' 10"  (1.778 m)   Wt 232 lb 4.8 oz (105.4 kg)   SpO2 98%   BMI 33.33 kg/m   Wt Readings from Last 3 Encounters:  04/25/23 232 lb 4.8 oz (105.4 kg)  04/11/23 235 lb (106.6 kg)  03/14/23 241 lb 1.6 oz (109.4 kg)    Physical Exam Vitals and nursing note reviewed.  Constitutional:      General: He is awake. He is not in acute distress.    Appearance: He is well-developed and well-groomed. He is obese. He is not ill-appearing or toxic-appearing.  HENT:     Head: Normocephalic.     Right Ear: Hearing and external ear normal.     Left Ear: Hearing and external ear normal.  Eyes:     General: Lids are normal.     Extraocular Movements: Extraocular movements intact.     Conjunctiva/sclera: Conjunctivae normal.  Neck:     Thyroid: No thyromegaly.     Vascular: No carotid bruit.  Cardiovascular:     Rate and Rhythm: Normal rate and regular rhythm.     Heart sounds: Normal heart sounds.  Pulmonary:     Effort: Pulmonary effort is normal. No accessory muscle usage or respiratory distress.     Breath sounds: Normal breath sounds.  Abdominal:     General: Bowel sounds are normal. There is no distension.     Palpations: Abdomen is soft.     Tenderness: There is no abdominal tenderness.  Musculoskeletal:     Cervical back: Full passive range of motion without pain.     Right lower leg: No edema.     Left lower leg: No edema.  Lymphadenopathy:     Cervical: No cervical adenopathy.  Skin:    General: Skin is warm.     Capillary Refill: Capillary refill takes less than 2 seconds.  Neurological:     Mental Status: He is alert and oriented to person, place, and time.     Deep Tendon Reflexes: Reflexes  are normal and symmetric.     Reflex Scores:      Brachioradialis reflexes are 2+ on the right side and 2+ on the left side.      Patellar reflexes are 2+ on the right side and 2+ on the left side. Psychiatric:        Attention and Perception: Attention normal.        Mood and Affect: Mood normal.        Speech: Speech normal.        Behavior: Behavior normal. Behavior is cooperative.        Thought Content: Thought content normal.    Results for orders placed or performed in visit on 01/24/23  HgB A1c  Result Value Ref Range   Hgb A1c MFr Bld 9.2 (H) 4.8 - 5.6 %   Est. average glucose Bld gHb Est-mCnc 217 mg/dL  Urine Microalbumin w/creat. ratio  Result Value  Ref Range   Creatinine, Urine 80.6 Not Estab. mg/dL   Microalbumin, Urine 16.1 Not Estab. ug/mL   Microalb/Creat Ratio 36 (H) 0 - 29 mg/g creat  CBC with Differential/Platelet  Result Value Ref Range   WBC 5.1 3.4 - 10.8 x10E3/uL   RBC 5.31 4.14 - 5.80 x10E6/uL   Hemoglobin 13.9 13.0 - 17.7 g/dL   Hematocrit 09.6 04.5 - 51.0 %   MCV 81 79 - 97 fL   MCH 26.2 (L) 26.6 - 33.0 pg   MCHC 32.2 31.5 - 35.7 g/dL   RDW 40.9 81.1 - 91.4 %   Platelets 166 150 - 450 x10E3/uL   Neutrophils 51 Not Estab. %   Lymphs 39 Not Estab. %   Monocytes 6 Not Estab. %   Eos 3 Not Estab. %   Basos 1 Not Estab. %   Neutrophils Absolute 2.6 1.4 - 7.0 x10E3/uL   Lymphocytes Absolute 2.0 0.7 - 3.1 x10E3/uL   Monocytes Absolute 0.3 0.1 - 0.9 x10E3/uL   EOS (ABSOLUTE) 0.1 0.0 - 0.4 x10E3/uL   Basophils Absolute 0.0 0.0 - 0.2 x10E3/uL   Immature Granulocytes 0 Not Estab. %   Immature Grans (Abs) 0.0 0.0 - 0.1 x10E3/uL  Lipid Panel w/o Chol/HDL Ratio  Result Value Ref Range   Cholesterol, Total 83 (L) 100 - 199 mg/dL   Triglycerides 782 0 - 149 mg/dL   HDL 31 (L) >95 mg/dL   VLDL Cholesterol Cal 25 5 - 40 mg/dL   LDL Chol Calc (NIH) 27 0 - 99 mg/dL  Comprehensive metabolic panel  Result Value Ref Range   Glucose 195 (H) 70 - 99 mg/dL   BUN  11 8 - 27 mg/dL   Creatinine, Ser 6.21 0.76 - 1.27 mg/dL   eGFR 63 >30 QM/VHQ/4.69   BUN/Creatinine Ratio 9 (L) 10 - 24   Sodium 142 134 - 144 mmol/L   Potassium 3.9 3.5 - 5.2 mmol/L   Chloride 102 96 - 106 mmol/L   CO2 24 20 - 29 mmol/L   Calcium 9.3 8.6 - 10.2 mg/dL   Total Protein 6.7 6.0 - 8.5 g/dL   Albumin 4.6 3.8 - 4.8 g/dL   Globulin, Total 2.1 1.5 - 4.5 g/dL   Albumin/Globulin Ratio 2.2 1.2 - 2.2   Bilirubin Total 1.7 (H) 0.0 - 1.2 mg/dL   Alkaline Phosphatase 103 44 - 121 IU/L   AST 21 0 - 40 IU/L   ALT 25 0 - 44 IU/L  TSH  Result Value Ref Range   TSH 3.720 0.450 - 4.500 uIU/mL  VITAMIN D 25 Hydroxy (Vit-D Deficiency, Fractures)  Result Value Ref Range   Vit D, 25-Hydroxy 6.7 (L) 30.0 - 100.0 ng/mL  Vitamin B12  Result Value Ref Range   Vitamin B-12 322 232 - 1,245 pg/mL  Magnesium  Result Value Ref Range   Magnesium 1.7 1.6 - 2.3 mg/dL  Thyroid peroxidase antibody  Result Value Ref Range   Thyroperoxidase Ab SerPl-aCnc <9 0 - 34 IU/mL  T4, free  Result Value Ref Range   Free T4 1.37 0.82 - 1.77 ng/dL      Assessment & Plan:   Problem List Items Addressed This Visit       Cardiovascular and Mediastinum   Aortic atherosclerosis (HCC)    Chronic, ongoing.  Noted on imaging 03/05/18.  Continue statin therapy and Eliquis for prevention.      Atrial fibrillation (HCC)    Chronic, ongoing.  Rate controlled.  Continue collaboration  with cardiology and current medication regimen as ordered by them.        Hypertension associated with diabetes (HCC)    Chronic, stable.  BP well at goal.  Recommend he monitor BP at least a few mornings a week at home and document.  DASH diet at home.  Continue current medication regimen and adjust as needed.  Labs today: CMP.  Return in 3 months.       Relevant Medications   Semaglutide,0.25 or 0.5MG /DOS, (OZEMPIC, 0.25 OR 0.5 MG/DOSE,) 2 MG/3ML SOPN   Other Relevant Orders   Bayer DCA Hb A1c Waived   Comprehensive  metabolic panel   Unstable angina (HCC)    Stable with no pain at this time.  Will continue to collaborate with cardiology and continue current medication regimen as ordered by them.  He is aware of red flags and reasons to immediately go to ER.        Respiratory   COPD (chronic obstructive pulmonary disease) with emphysema (HCC)    Chronic, ongoing, scheduled to see pulmonary upcoming which will be beneficial.  Levels >80%.  Continue current inhaler regimen and adjust as needed.  Recommend he consistently use his Breztri and not miss doses as may benefit his SOB, educated him on this.      OSA on CPAP    Chronic, no use of CPAP.  Recommend he use 100% of the time.        Endocrine   Hyperlipidemia associated with type 2 diabetes mellitus (HCC)    Chronic, ongoing.  Continue current medication regimen and adjust as needed.  Lipid panel today.         Relevant Medications   Semaglutide,0.25 or 0.5MG /DOS, (OZEMPIC, 0.25 OR 0.5 MG/DOSE,) 2 MG/3ML SOPN   Other Relevant Orders   Bayer DCA Hb A1c Waived   Comprehensive metabolic panel   Lipid Panel w/o Chol/HDL Ratio   Type 2 diabetes mellitus with morbid obesity (HCC) - Primary    Chronic, ongoing.  A1c today 6.3%, major trend down with GLP1 on board and has had weight loss of 10 pounds. Check urine ALB 30 February 2024, continue Ramipril for kidney protection.  Check BS 2-3 times a day, he is noticing a down trend in sugar levels.  Recommend heavy focus on diabetic diet and maintain current regimen at this time.  Return in 3 months. Praised for this success.  Continue to work with CCM team. - ACE and statin on board - Eye and foot exam up to date - Vaccines up to date      Relevant Medications   Semaglutide,0.25 or 0.5MG /DOS, (OZEMPIC, 0.25 OR 0.5 MG/DOSE,) 2 MG/3ML SOPN   Other Relevant Orders   Bayer DCA Hb A1c Waived   Comprehensive metabolic panel     Genitourinary   Stage 3a chronic kidney disease (CKD) (HCC)    Chronic,  ongoing - recent labs normal range.  At this time continue Ramipril for kidney protection.  CMP today.      Relevant Orders   Comprehensive metabolic panel     Other   B12 deficiency    Chronic.  Noted on past labs, check today and continue daily supplement at home.      Relevant Orders   Vitamin B12   Obesity    BMI 33.33 with loss present, underlying T2DM, HTN/HLD.  Recommended eating smaller high protein, low fat meals more frequently and exercising 30 mins a day 5 times a week with a goal of  10-15lb weight loss in the next 3 months. Patient voiced their understanding and motivation to adhere to these recommendations.       Relevant Medications   Semaglutide,0.25 or 0.5MG /DOS, (OZEMPIC, 0.25 OR 0.5 MG/DOSE,) 2 MG/3ML SOPN   Vitamin D deficiency    On past labs, recheck today and continue supplement at home.      Relevant Orders   VITAMIN D 25 Hydroxy (Vit-D Deficiency, Fractures)     Follow up plan: Return in about 3 months (around 07/26/2023) for T2DM, HTN/HLD, A-FIB, CKD, COPD, GERD.

## 2023-04-25 NOTE — Assessment & Plan Note (Signed)
Chronic.  Noted on past labs, check today and continue daily supplement at home.

## 2023-04-25 NOTE — Assessment & Plan Note (Signed)
Chronic, stable.  BP well at goal.  Recommend he monitor BP at least a few mornings a week at home and document.  DASH diet at home.  Continue current medication regimen and adjust as needed.  Labs today: CMP.  Return in 3 months.

## 2023-04-26 ENCOUNTER — Ambulatory Visit: Payer: Self-pay | Admitting: *Deleted

## 2023-04-26 LAB — COMPREHENSIVE METABOLIC PANEL
ALT: 17 IU/L (ref 0–44)
AST: 20 IU/L (ref 0–40)
Albumin/Globulin Ratio: 2.2 (ref 1.2–2.2)
Albumin: 4.4 g/dL (ref 3.8–4.8)
Alkaline Phosphatase: 87 IU/L (ref 44–121)
BUN/Creatinine Ratio: 9 — ABNORMAL LOW (ref 10–24)
BUN: 11 mg/dL (ref 8–27)
Bilirubin Total: 1.3 mg/dL — ABNORMAL HIGH (ref 0.0–1.2)
CO2: 22 mmol/L (ref 20–29)
Calcium: 9.6 mg/dL (ref 8.6–10.2)
Chloride: 105 mmol/L (ref 96–106)
Creatinine, Ser: 1.26 mg/dL (ref 0.76–1.27)
Globulin, Total: 2 g/dL (ref 1.5–4.5)
Glucose: 121 mg/dL — ABNORMAL HIGH (ref 70–99)
Potassium: 3.8 mmol/L (ref 3.5–5.2)
Sodium: 145 mmol/L — ABNORMAL HIGH (ref 134–144)
Total Protein: 6.4 g/dL (ref 6.0–8.5)
eGFR: 61 mL/min/{1.73_m2} (ref >59)

## 2023-04-26 LAB — LIPID PANEL W/O CHOL/HDL RATIO
Cholesterol, Total: 60 mg/dL — ABNORMAL LOW (ref 100–199)
HDL: 32 mg/dL — ABNORMAL LOW (ref >39)
LDL Chol Calc (NIH): 10 mg/dL (ref 0–99)
Triglycerides: 90 mg/dL (ref 0–149)
VLDL Cholesterol Cal: 18 mg/dL (ref 5–40)

## 2023-04-26 LAB — VITAMIN B12: Vitamin B-12: 811 pg/mL (ref 232–1245)

## 2023-04-26 LAB — VITAMIN D 25 HYDROXY (VIT D DEFICIENCY, FRACTURES): Vit D, 25-Hydroxy: 69.7 ng/mL (ref 30.0–100.0)

## 2023-04-26 NOTE — Progress Notes (Signed)
Good morning, please let Ernest [name] know his labs have returned: - Kidney and liver function are normal. - Salt level a little elevated, recommend cut back on salt and increase water intake a little -- we will recheck level next visit. - Remainder of labs stable -- including B12 and Vitamin D improved, continue supplements.  Great job!!  Any questions? Keep being stellar!!  Thank you for allowing me to participate in your care.  I appreciate you. Kindest regards, Lamara Brecht

## 2023-04-26 NOTE — Patient Outreach (Signed)
  Care Coordination   Follow Up Visit Note   04/26/2023 Name: Ernest [name] MRN: [id number] DOB: [birth year]  Ernest [name] is a 72 y.o. year old male who sees Aura Dials T, NP for primary care. I spoke with  [name] by phone today.  What matters to the patients health and wellness today?  Keep DM controlled    Goals Addressed             This Visit's Progress    Effective management of DM   On track    Care Coordination Interventions: Provided education to patient about basic DM disease process Reviewed medications with patient and discussed importance of medication adherence Discussed plans with patient for ongoing care management follow up and provided patient with direct contact information for care management team Advised patient, providing education and rationale, to check cbg daily and record, calling PCP for findings outside established parameters Diet discussed, including staying away from Mellow Yellow drinks and ice cream.  State he has continued to stay away from these foods, weight is down from 270 to 232 pounds           SDOH assessments and interventions completed:  No     Care Coordination Interventions:  Yes, provided   Interventions Today    Flowsheet Row Most Recent Value  Chronic Disease   Chronic disease during today's visit Diabetes  General Interventions   General Interventions Discussed/Reviewed General Interventions Reviewed, Doctor Visits  Doctor Visits Discussed/Reviewed Doctor Visits Reviewed, PCP, Specialist  [facility] 5/21, Pharmacy team 6/3, PCP 8/8]  PCP/Specialist Visits Compliance with follow-up visit  Education Interventions   Education Provided Provided Education  Provided Verbal Education On Blood Sugar Monitoring, Medication, When to see the doctor, Labs  [Blood sugar 155 today.  Taking Trulicity until medication assistance can be approved for Ozempic]  Labs Reviewed Hgb A1c  [new A1C is 6.3]  Nutrition  Interventions   Nutrition Discussed/Reviewed Adding fruits and vegetables, Nutrition Reviewed, Decreasing sugar intake  [Decrease mellow yellow]        Follow up plan: Follow up call scheduled for 8/9    Encounter Outcome:  Pt. Visit Completed   Kemper Durie, RN, MSN, [facility] Care Management Care Management Coordinator [phone]

## 2023-05-04 ENCOUNTER — Telehealth: Payer: Self-pay | Admitting: Nurse Practitioner

## 2023-05-04 NOTE — Telephone Encounter (Signed)
Patient brought in Patient Assistance forms along with income for [employer] to be completed by [employer]. Placed forms in providers folder and put it in the box. Patient ask for phone call upon completion.

## 2023-05-07 NOTE — Telephone Encounter (Signed)
Patient assistance has been notified 

## 2023-05-07 NOTE — Telephone Encounter (Signed)
Signed paperwork has been placed in Pharmacy folder for pick up by pharmacy team

## 2023-05-08 ENCOUNTER — Institutional Professional Consult (permissible substitution): Payer: Medicare HMO | Admitting: Student in an Organized Health Care Education/Training Program

## 2023-05-18 ENCOUNTER — Telehealth: Payer: Self-pay

## 2023-05-18 NOTE — Progress Notes (Cosign Needed)
Care Management & Coordination Services Pharmacy Team  Reason for Encounter: Appointment Reminder  Contacted patient to confirm telephone appointment with Ernest [name], PharmD on 05/21/23 at 3:00pm. Unsuccessful outreach. Left voicemail for patient to return call.  Rance Muir, RMA

## 2023-05-21 ENCOUNTER — Ambulatory Visit: Payer: Medicare HMO

## 2023-05-21 NOTE — Patient Outreach (Signed)
  Care Management   Follow Up Note   05/21/2023 Name: Ernest [name] MRN: [id number] DOB: 07/26/1951   Referred by: Marjie Skiff, NP Reason for referral : Care Coordination   Successful contact was made with the patient to discuss care management and care coordination services. Patient declines engagement at this time.  -Patient was at his brother's house and didn't have time for visit. He forgot he had one scheduled. Unable to schedule f/u since he was away -Did state he brought in Ozempic PAP last week  Follow Up Plan: The patient has been provided with contact information for the care management team and has been advised to call with any health related questions or concerns.   Artelia Laroche, Pharm.D. - [phone]

## 2023-05-22 ENCOUNTER — Telehealth: Payer: Self-pay

## 2023-05-22 NOTE — Progress Notes (Signed)
Care Management & Coordination Services Pharmacy Team  Reason for Encounter: Patient assistance renewal   Patient is currently enrolled in [employer] patient assistance program for the medication Ozempic start date 05/18/23 until date: 12/18/23. Shipment sent out on 05/18/23 may take 10-14 business days for delivery.  Spoke with patient about approval and delivery.  Velvet Bathe

## 2023-05-22 NOTE — Progress Notes (Signed)
Care Management & Coordination Services Pharmacy Team  Reason for Encounter: Diabetes  Contacted patient to discuss diabetes disease state. Spoke with patient on 05/22/2023   Current antihyperglycemic regimen:  Farxiga 10 mg Metformin 500 mg Ozempic 0.5 mg    Patient verbally confirms he is taking the above medications as directed. Yes  What diet changes have been made to improve diabetes control?  What recent interventions/DTPs have been made to improve glycemic control:  None noted  Have there been any recent hospitalizations or ED visits since last visit with PharmD? No  Patient denies hypoglycemic symptoms, including None  Patient denies hyperglycemic symptoms, including none  How often are you checking your blood sugar? Patient states that he has not taken any bs readings because he has been very busy lately. I asked patient to start writing down his bs readings and that I will call him back 05/29/23 to get readings, patient agreed.  What are your blood sugars ranging? Na  During the week, how often does your blood glucose drop below 70? Never  Are you checking your feet daily/regularly? Yes  Adherence Review: Is the patient currently on a STATIN medication? Yes Is the patient currently on ACE/ARB medication? No Does the patient have >5 day gap between last estimated fill dates? No   Chart Updates:  Recent office visits:  None since last coordination call  Recent consult visits:  None since last coordination call  Hospital visits:  None in previous 6 months  Medications: Outpatient Encounter Medications as of 05/22/2023  Medication Sig   ACCU-CHEK GUIDE test strip    albuterol (VENTOLIN HFA) 108 (90 Base) MCG/ACT inhaler Inhale 2 puffs into the lungs every 4 (four) hours as needed for wheezing or shortness of breath. Uses 2-3 times daily   atorvastatin (LIPITOR) 80 MG tablet TAKE ONE TABLET BY MOUTH EVERY MORNING   Budeson-Glycopyrrol-Formoterol (BREZTRI  AEROSPHERE) 160-9-4.8 MCG/ACT AERO Inhale 2 puffs into the lungs 2 (two) times daily.   Cholecalciferol 1.25 MG (50000 UT) TABS Take 1 tablet by mouth once a week.   clotrimazole-betamethasone (LOTRISONE) cream Apply 1 Application topically 2 (two) times daily.   dapagliflozin propanediol (FARXIGA) 10 MG TABS tablet Take 1 tablet (10 mg total) by mouth daily.   diltiazem (CARDIZEM CD) 180 MG 24 hr capsule Take 1 capsule (180 mg total) by mouth daily.   ezetimibe (ZETIA) 10 MG tablet Take 1 tablet (10 mg total) by mouth daily.   furosemide (LASIX) 20 MG tablet TAKE ONE TABLET BY MOUTH EVERY MORNING and TAKE ONE TABLET BY MOUTH EVERYDAY AT BEDTIME   gabapentin (NEURONTIN) 600 MG tablet TAKE TWO TABLETS BY MOUTH EVERYDAY AT BEDTIME   isosorbide mononitrate (IMDUR) 60 MG 24 hr tablet TAKE ONE TABLET BY MOUTH EVERY MORNING   metFORMIN (GLUCOPHAGE-XR) 500 MG 24 hr tablet Take 1 tablet (500 mg total) by mouth in the morning and at bedtime. 1 tab bid   metoprolol succinate (TOPROL-XL) 50 MG 24 hr tablet TAKE 1 TABLET BY MOUTH ONCE DAILY WITH OR IMMEDIATELY FOLLOWING A MEAL   nitroGLYCERIN (NITROSTAT) 0.4 MG SL tablet Place 1 tablet (0.4 mg total) under the tongue every 5 (five) minutes as needed for chest pain.   pantoprazole (PROTONIX) 40 MG tablet TAKE 1 TABLET(40 MG) BY MOUTH DAILY   ramipril (ALTACE) 10 MG capsule TAKE ONE CAPSULE BY MOUTH ONCE DAILY   rivaroxaban (XARELTO) 20 MG TABS tablet TAKE ONE TABLET BY MOUTH EVERY EVENING   Semaglutide,0.25 or 0.5MG /DOS, (OZEMPIC, 0.25  OR 0.5 MG/DOSE,) 2 MG/3ML SOPN Inject 0.5 mg into the skin once a week.   No facility-administered encounter medications on file as of 05/22/2023.    Recent Relevant Labs: Lab Results  Component Value Date/Time   HGBA1C 6.3 (H) 04/25/2023 08:18 AM   HGBA1C 9.2 (H) 01/24/2023 12:00 AM   HGBA1C 7.2 (H) 10/10/2022 11:50 AM   HGBA1C 7.5 (H) 07/25/2014 04:25 AM   MICROALBUR 80 (H) 12/05/2021 08:34 AM   MICROALBUR 80 (H)  05/31/2021 02:16 PM    Kidney Function Lab Results  Component Value Date/Time   CREATININE 1.26 04/25/2023 08:20 AM   CREATININE 1.22 01/24/2023 12:00 AM   CREATININE 1.21 07/25/2014 04:25 AM   CREATININE 1.39 (H) 07/24/2014 11:11 AM   GFRNONAA >60 04/04/2021 11:35 AM   GFRNONAA >60 07/25/2014 04:25 AM   GFRAA 81 11/19/2020 11:09 AM   GFRAA >60 07/25/2014 04:25 AM    Star Rating Drugs:  Medication:  Last Fill: Day Supply Farxiga 10 mg  02/13/23 Gets from [facility] Metformin 500 mg 05/03/23  30ds Ozempic 0.5 mg 05/18/23 Gets from [employer]  Care Gaps: Annual wellness visit in last year? Yes Last eye exam / retinopathy screening: 06/14/22 Last diabetic foot exam:01/24/23   Ernest [name]

## 2023-05-23 ENCOUNTER — Institutional Professional Consult (permissible substitution): Payer: Medicare HMO | Admitting: Student in an Organized Health Care Education/Training Program

## 2023-05-28 ENCOUNTER — Encounter: Payer: Self-pay | Admitting: Student in an Organized Health Care Education/Training Program

## 2023-06-05 ENCOUNTER — Telehealth: Payer: Self-pay

## 2023-06-05 NOTE — Telephone Encounter (Signed)
5 boxes of Ozempic received for the patient. Called and notified patient that his medication was ready to be picked up.

## 2023-06-06 NOTE — Telephone Encounter (Signed)
Medication picked up by the patient.  

## 2023-06-25 ENCOUNTER — Other Ambulatory Visit: Payer: Self-pay | Admitting: Nurse Practitioner

## 2023-06-25 DIAGNOSIS — G2581 Restless legs syndrome: Secondary | ICD-10-CM

## 2023-06-26 NOTE — Telephone Encounter (Signed)
Requested Prescriptions  Pending Prescriptions Disp Refills   isosorbide mononitrate (IMDUR) 60 MG 24 hr tablet [Pharmacy Med Name: isosorbide mononitrate ER 60 mg tablet,extended release 24 hr] 90 tablet 0    Sig: TAKE ONE TABLET BY MOUTH EVERY MORNING     Cardiovascular:  Nitrates Passed - 06/25/2023  4:37 PM      Passed - Last BP in normal range    BP Readings from Last 1 Encounters:  04/25/23 129/70         Passed - Last Heart Rate in normal range    Pulse Readings from Last 1 Encounters:  04/25/23 90         Passed - Valid encounter within last 12 months    Recent Outpatient Visits           2 months ago Type 2 diabetes mellitus with morbid obesity (HCC)   [place] [facility] [place], [place] T, NP   3 months ago Unstable angina (HCC)   [place] [facility] [place], [place] T, NP   3 months ago Longstanding persistent atrial fibrillation (HCC)   [place] [facility] [place], Jobos T, NP   5 months ago Type 2 diabetes mellitus with morbid obesity (HCC)   [place] [facility] [place], [place] T, NP   8 months ago Type 2 diabetes mellitus with morbid obesity (HCC)   [place] [facility] Mecum, Oswaldo Conroy, PA-C       Future Appointments             In 1 month Cannady, [place] T, NP [place] [facility]             atorvastatin (LIPITOR) 80 MG tablet [Pharmacy Med Name: atorvastatin 80 mg tablet] 90 tablet 0    Sig: TAKE ONE TABLET BY MOUTH EVERY MORNING     Cardiovascular:  Antilipid - Statins Failed - 06/25/2023  4:37 PM      Failed - Lipid Panel in normal range within the last 12 months    Cholesterol, Total  Date Value Ref Range Status  04/25/2023 60 (L) 100 - 199 mg/dL Final   Cholesterol  Date Value Ref Range Status  07/25/2014 104 0 - 200 mg/dL Final   Cholesterol Piccolo, Waived  Date Value Ref Range Status  08/01/2017 195 <200 mg/dL Final    Comment:                             Desirable                <200                         Borderline High      200- 239                         High                     >239    Ldl Cholesterol, Calc  Date Value Ref Range Status  07/25/2014 30 0 - 100 mg/dL Final   LDL Chol Calc (NIH)  Date Value Ref Range Status  04/25/2023 10 0 - 99 mg/dL Final   HDL Cholesterol  Date Value Ref Range Status  07/25/2014 25 (L) 40 - 60 mg/dL Final   HDL  Date Value  Ref Range Status  04/25/2023 32 (L) >39 mg/dL Final   Triglycerides  Date Value Ref Range Status  04/25/2023 90 0 - 149 mg/dL Final  16/09/9603 540 (H) 0 - 200 mg/dL Final   Triglyceride fasting, serum  Date Value Ref Range Status  05/27/2010 249 mg/dL    Triglycerides Piccolo,Waived  Date Value Ref Range Status  08/01/2017 268 (H) <150 mg/dL Final    Comment:                            Normal                   <150                         Borderline High     150 - 199                         High                200 - 499                         Very High                >499          Passed - Patient is not pregnant      Passed - Valid encounter within last 12 months    Recent Outpatient Visits           2 months ago Type 2 diabetes mellitus with morbid obesity (HCC)   [place] [facility] [place], Crane T, NP   3 months ago Unstable angina (HCC)   [place] [facility] [place], [place] T, NP   3 months ago Longstanding persistent atrial fibrillation (HCC)   [place] [facility] [place], Jacona T, NP   5 months ago Type 2 diabetes mellitus with morbid obesity (HCC)   [place] [facility] [place], [place] T, NP   8 months ago Type 2 diabetes mellitus with morbid obesity (HCC)   [place] [facility] Mecum, Oswaldo Conroy, PA-C       Future Appointments             In 1 month Cannady, Dorie Rank, NP [place] [facility]              gabapentin (NEURONTIN) 600 MG tablet [Pharmacy Med Name: gabapentin 600 mg tablet] 180 tablet 0    Sig: TAKE TWO TABLETS BY MOUTH EVERYDAY AT BEDTIME     Neurology: Anticonvulsants - gabapentin Passed - 06/25/2023  4:37 PM      Passed - Cr in normal range and within 360 days    Creatinine  Date Value Ref Range Status  07/25/2014 1.21 0.60 - 1.30 mg/dL Final   Creatinine, Ser  Date Value Ref Range Status  04/25/2023 1.26 0.76 - 1.27 mg/dL Final         Passed - Completed PHQ-2 or PHQ-9 in the last 360 days      Passed - Valid encounter within last 12 months    Recent Outpatient Visits           2 months ago Type 2 diabetes mellitus with morbid obesity (HCC)   [place] [facility] [place], Dorie Rank, NP  3 months ago Unstable angina (HCC)   [place] [facility] [place], [place] T, NP   3 months ago Longstanding persistent atrial fibrillation (HCC)   [place] [facility] [place], [place] T, NP   5 months ago Type 2 diabetes mellitus with morbid obesity (HCC)   [place] [facility] [place], [place] T, NP   8 months ago Type 2 diabetes mellitus with morbid obesity (HCC)   [place] [facility] Mecum, Oswaldo Conroy, PA-C       Future Appointments             In 1 month Cannady, Dorie Rank, NP [place] [facility]

## 2023-07-16 ENCOUNTER — Other Ambulatory Visit: Payer: Self-pay | Admitting: Nurse Practitioner

## 2023-07-16 DIAGNOSIS — I482 Chronic atrial fibrillation, unspecified: Secondary | ICD-10-CM

## 2023-07-17 NOTE — Telephone Encounter (Signed)
Requested Prescriptions  Pending Prescriptions Disp Refills   clotrimazole-betamethasone (LOTRISONE) cream [Pharmacy Med Name: clotrimazole-betamethasone 1 %-0.05 % topical cream] 30 g 2    Sig: APPLY ONE application TOPICALLY twice daily AS DIRECTED     Off-Protocol Failed - 07/16/2023  8:02 AM      Failed - Medication not assigned to a protocol, review manually.      Passed - Valid encounter within last 12 months    Recent Outpatient Visits           2 months ago Type 2 diabetes mellitus with morbid obesity (HCC)   [place] [facility] [place], [place] T, NP   4 months ago Unstable angina (HCC)   [place] [facility] [place], [place] T, NP   4 months ago Longstanding persistent atrial fibrillation (HCC)   [place] [facility] [place], [place] T, NP   5 months ago Type 2 diabetes mellitus with morbid obesity (HCC)   [place] [facility] [place], [place] T, NP   9 months ago Type 2 diabetes mellitus with morbid obesity (HCC)   [place] [facility] Mecum, Oswaldo Conroy, PA-C       Future Appointments             In 1 week Cannady, Dorie Rank, NP [place] [facility]             furosemide (LASIX) 20 MG tablet [Pharmacy Med Name: furosemide 20 mg tablet] 180 tablet 0    Sig: TAKE ONE TABLET BY MOUTH EVERY MORNING and TAKE ONE TABLET BY MOUTH EVERYDAY AT BEDTIME     Cardiovascular:  Diuretics - Loop Failed - 07/16/2023  8:02 AM      Failed - Na in normal range and within 180 days    Sodium  Date Value Ref Range Status  04/25/2023 145 (H) 134 - 144 mmol/L Final  07/25/2014 139 136 - 145 mmol/L Final         Passed - K in normal range and within 180 days    Potassium  Date Value Ref Range Status  04/25/2023 3.8 3.5 - 5.2 mmol/L Final  07/25/2014 3.7 3.5 - 5.1 mmol/L Final         Passed - Ca in normal range and within 180 days    Calcium  Date Value Ref Range Status  04/25/2023  9.6 8.6 - 10.2 mg/dL Final   Calcium, Total  Date Value Ref Range Status  07/25/2014 8.9 8.5 - 10.1 mg/dL Final   Calcium, Ion  Date Value Ref Range Status  07/27/2010 1.08 (L) 1.12 - 1.32 mmol/L Final         Passed - Cr in normal range and within 180 days    Creatinine  Date Value Ref Range Status  07/25/2014 1.21 0.60 - 1.30 mg/dL Final   Creatinine, Ser  Date Value Ref Range Status  04/25/2023 1.26 0.76 - 1.27 mg/dL Final         Passed - Cl in normal range and within 180 days    Chloride  Date Value Ref Range Status  04/25/2023 105 96 - 106 mmol/L Final  07/25/2014 105 98 - 107 mmol/L Final         Passed - Mg Level in normal range and within 180 days    Magnesium  Date Value Ref Range Status  01/24/2023 1.7 1.6 - 2.3 mg/dL Final  16/09/9603 1.6 (L) mg/dL Final  Comment:    1.8-2.4 THERAPEUTIC RANGE: 4-7 mg/dL TOXIC: > 10 mg/dL  -----------------------          Passed - Last BP in normal range    BP Readings from Last 1 Encounters:  04/25/23 129/70         Passed - Valid encounter within last 6 months    Recent Outpatient Visits           2 months ago Type 2 diabetes mellitus with morbid obesity (HCC)   [place] [facility] [place], Lom[place] T, NP   4 months ago Unstable angina (HCC)   [place] [facility] [place], [place] T, NP   4 months ago Longstanding persistent atrial fibrillation (HCC)   [place] [facility] [place], [place] T, NP   5 months ago Type 2 diabetes mellitus with morbid obesity (HCC)   [place] [facility] [place], [place] T, NP   9 months ago Type 2 diabetes mellitus with morbid obesity (HCC)   [place] [facility] Mecum, Oswaldo Conroy, PA-C       Future Appointments             In 1 week Cannady, Dorie Rank, NP  [facility], [facility]

## 2023-07-17 NOTE — Telephone Encounter (Signed)
Requested medication (s) are due for refill today: yes  Requested medication (s) are on the active medication list: yes  Last refill:  04/25/23  Future visit scheduled: yes  Notes to clinic:   Medication not assigned to a protocol, review manually.      Requested Prescriptions  Pending Prescriptions Disp Refills   clotrimazole-betamethasone (LOTRISONE) cream [Pharmacy Med Name: clotrimazole-betamethasone 1 %-0.05 % topical cream] 30 g 2    Sig: APPLY ONE application TOPICALLY twice daily AS DIRECTED     Off-Protocol Failed - 07/16/2023  8:02 AM      Failed - Medication not assigned to a protocol, review manually.      Passed - Valid encounter within last 12 months    Recent Outpatient Visits           2 months ago Type 2 diabetes mellitus with morbid obesity (HCC)   [place] [facility] [place], [place] T, NP   4 months ago Unstable angina (HCC)   [place] [facility] [place], Sax[place] T, NP   4 months ago Longstanding persistent atrial fibrillation (HCC)   [place] [facility] [place], Chical T, NP   5 months ago Type 2 diabetes mellitus with morbid obesity (HCC)   [place] [facility] [place], [place] T, NP   9 months ago Type 2 diabetes mellitus with morbid obesity (HCC)   [place] [facility] Mecum, Oswaldo Conroy, PA-C       Future Appointments             In 1 week Cannady, Dorie Rank, NP [place] [facility]            Signed Prescriptions Disp Refills   furosemide (LASIX) 20 MG tablet 180 tablet 0    Sig: TAKE ONE TABLET BY MOUTH EVERY MORNING and TAKE ONE TABLET BY MOUTH EVERYDAY AT BEDTIME     Cardiovascular:  Diuretics - Loop Failed - 07/16/2023  8:02 AM      Failed - Na in normal range and within 180 days    Sodium  Date Value Ref Range Status  04/25/2023 145 (H) 134 - 144 mmol/L Final  07/25/2014 139 136 - 145 mmol/L Final         Passed - K in normal range and  within 180 days    Potassium  Date Value Ref Range Status  04/25/2023 3.8 3.5 - 5.2 mmol/L Final  07/25/2014 3.7 3.5 - 5.1 mmol/L Final         Passed - Ca in normal range and within 180 days    Calcium  Date Value Ref Range Status  04/25/2023 9.6 8.6 - 10.2 mg/dL Final   Calcium, Total  Date Value Ref Range Status  07/25/2014 8.9 8.5 - 10.1 mg/dL Final   Calcium, Ion  Date Value Ref Range Status  07/27/2010 1.08 (L) 1.12 - 1.32 mmol/L Final         Passed - Cr in normal range and within 180 days    Creatinine  Date Value Ref Range Status  07/25/2014 1.21 0.60 - 1.30 mg/dL Final   Creatinine, Ser  Date Value Ref Range Status  04/25/2023 1.26 0.76 - 1.27 mg/dL Final         Passed - Cl in normal range and within 180 days    Chloride  Date Value Ref Range Status  04/25/2023 105 96 - 106 mmol/L Final  07/25/2014 105 98 - 107 mmol/L  Final         Passed - Mg Level in normal range and within 180 days    Magnesium  Date Value Ref Range Status  01/24/2023 1.7 1.6 - 2.3 mg/dL Final  40/98/1191 1.6 (L) mg/dL Final    Comment:    4.7-8.2 THERAPEUTIC RANGE: 4-7 mg/dL TOXIC: > 10 mg/dL  -----------------------          Passed - Last BP in normal range    BP Readings from Last 1 Encounters:  04/25/23 129/70         Passed - Valid encounter within last 6 months    Recent Outpatient Visits           2 months ago Type 2 diabetes mellitus with morbid obesity (HCC)   [place] [facility] [place], Corrie Dandy T, NP   4 months ago Unstable angina (HCC)   [place] [facility] [place], [place] T, NP   4 months ago Longstanding persistent atrial fibrillation (HCC)   [place] [facility] [place], Marydel T, NP   5 months ago Type 2 diabetes mellitus with morbid obesity (HCC)   [place] [facility] [place], [place] T, NP   9 months ago Type 2 diabetes mellitus with morbid obesity (HCC)   [place] [facility] Mecum, Oswaldo Conroy, PA-C       Future Appointments             In 1 week Cannady, Dorie Rank, NP  [facility]

## 2023-07-19 NOTE — Patient Instructions (Addendum)
Pulmonary -- call to reschedule at [phone]  We are going to cut Ramipril in 1/2 to 5 MG daily.  Complete 10 MG tablets by cutting in 1/2 and then once they are gone start 5 MG pill I sent in.  This may improve your dizziness.  Be Involved in Caring For Your Health:  Taking Medications When medications are taken as directed, they can greatly improve your health. But if they are not taken as prescribed, they may not work. In some cases, not taking them correctly can be harmful. To help ensure your treatment remains effective and safe, understand your medications and how to take them. Bring your medications to each visit for review by your provider.  Your lab results, notes, and after visit summary will be available on My Chart. We strongly encourage you to use this feature. If lab results are abnormal the clinic will contact you with the appropriate steps. If the clinic does not contact you assume the results are satisfactory. You can always view your results on My Chart. If you have questions regarding your health or results, please contact the clinic during office hours. You can also ask questions on My Chart.  We at [facility] are grateful that you chose [place] to provide your care. We strive to provide evidence-based and compassionate care and are always looking for feedback. If you get a survey from the clinic please complete this so we can hear your opinions.  Food Basics for Chronic Kidney Disease Chronic kidney disease (CKD) is when your kidneys are not working well. They cannot remove waste, fluids, and other substances from your blood the way they should. These substances can build up, which can worsen kidney damage and affect how your body works. Eating certain foods can lead to a buildup of these substances. Changing your diet can help prevent more kidney damage. Diet changes may also delay dialysis or even keep you from needing it. What nutrients should I  limit? Work with your treatment team and a food expert (dietitian) to make a meal plan that's right for you. Foods you can eat and foods you should limit or avoid will depend on the stage of your kidney disease and any other health conditions you have. The items listed below are not a complete list. Talk with your dietitian to learn what is best for you. Potassium Potassium affects how steadily your heart beats. Too much potassium in your blood can cause an irregular heartbeat or even a heart attack. You may need to limit foods that are high in potassium, such as: Liquid milk and soy milk. Salt substitutes that contain potassium. Fruits like bananas, apricots, nectarines, melon, prunes, raisins, kiwi, and oranges. Vegetables, such as potatoes, sweet potatoes, yams, tomatoes, leafy greens, beets, avocado, pumpkin, and winter squash. Beans, like lima beans. Nuts. Phosphorus Phosphorus is a mineral found in your bones. You need a balance between calcium and phosphorus to build and maintain healthy bones. Too much added phosphorus from the foods you eat can pull calcium from your bones. Losing calcium can make your bones weak and more likely to break. Too much phosphorus can also make your skin itch. You may need to limit foods that are high in phosphorus or that have added phosphorus, such as: Liquid milk and dairy products. Dark-colored sodas or soft drinks. Bran cereals and oatmeal. Protein  Protein helps you make and keep muscle. Protein also helps to repair your body's cells and tissues. One of the natural  breakdown products of protein is a waste product called urea. When your kidneys are not working well, they cannot remove types of waste like urea. Reducing protein in your diet can help keep urea from building up in your blood. Depending on your stage of kidney disease, you may need to eat smaller portions of foods that are high in protein. Sources of animal protein include: Meat (all  types). Fish and seafood. Poultry. Eggs. Dairy. Other protein foods include: Beans and legumes. Nuts and nut butter. Soy, like tofu.  Sodium Salt (sodium) helps to keep a healthy balance of fluids in your body. Too much salt can increase your blood pressure, which can harm your heart and lungs. Extra salt can also cause your body to keep too much fluid, making your kidneys work harder. You may need to limit or avoid foods that are high in salt, such as: Salt seasonings. Soy and teriyaki sauce. Packaged, precooked, cured, or processed meats, such as sausages or meat loaves. Sardines. Salted crackers and snack foods. Fast food. Canned soups and most canned foods. Pickled foods. Vegetable juice. Boxed mixes or ready-to-eat boxed meals and side dishes. Bottled dressings, sauces, and marinades. Talk with your dietitian about how much potassium, phosphorus, protein, and salt you may have each day. Helpful tips Read food labels  Check the amount of salt in foods. Limit foods that have salt or sodium listed among the first five ingredients. Try to eat low-salt foods. Check the ingredient list for added phosphorus or potassium. "Phos" in an ingredient is a sign that phosphorus has been added. Do not buy foods that are calcium-enriched or that have calcium added to them (are fortified). Buy canned vegetables and beans that say "no salt added" and rinse them before eating. Lifestyle Limit the amount of protein you eat from animal sources each day. Focus on protein from plant sources, like tofu and dried beans, peas, and lentils. Do not add salt to food when cooking or before eating. Do not eat star fruit. It can be toxic for people with kidney problems. Talk with your health care provider before taking any vitamin or mineral supplements. If told by your health care provider, track how much liquid you drink so you can avoid drinking too much. You may need to include foods you eat that are  made mostly from water, like gelatin, ice cream, soups, and juicy fruits and vegetables. If you have diabetes: If you have diabetes (diabetes mellitus) and CKD, you need to keep your blood sugar (glucose) in the target range recommended by your health care provider. Follow your diabetes management plan. This may include: Checking your blood glucose regularly. Taking medicines by mouth, or taking insulin, or both. Exercising for at least 30 minutes on 5 or more days each week, or as told by your health care provider. Tracking how many servings of carbohydrates you eat at each meal. Not using orange juice to treat low blood sugars. Instead, use apple juice, cranberry juice, or clear soda. You may be given guidelines on what foods and nutrients you may eat, and how much you can have each day. This depends on your stage of kidney disease and whether you have high blood pressure (hypertension). Follow the meal plan your dietitian gives you. To learn more: [employer] of Diabetes and Digestive and Kidney Diseases: [employer]: kidney.org Summary Chronic kidney disease (CKD) is when your kidneys are not working well. They cannot remove waste, fluids, and other substances from your blood  the way they should. These substances can build up, which can worsen kidney damage and affect how your body works. Changing your diet can help prevent more kidney damage. Diet changes may also delay dialysis or even keep you from needing it. Diet changes are different for each person with CKD. Work with a dietitian to set up a meal plan that is right for you. This information is not intended to replace advice given to you by your health care provider. Make sure you discuss any questions you have with your health care provider. Document Revised: 03/23/2022 Document Reviewed: 03/29/2020 Elsevier Patient Education  2024 [employer].

## 2023-07-26 ENCOUNTER — Ambulatory Visit: Payer: Medicare HMO | Admitting: Nurse Practitioner

## 2023-07-26 ENCOUNTER — Encounter: Payer: Self-pay | Admitting: Nurse Practitioner

## 2023-07-26 DIAGNOSIS — G2581 Restless legs syndrome: Secondary | ICD-10-CM

## 2023-07-26 DIAGNOSIS — I4811 Longstanding persistent atrial fibrillation: Secondary | ICD-10-CM

## 2023-07-26 DIAGNOSIS — I152 Hypertension secondary to endocrine disorders: Secondary | ICD-10-CM | POA: Diagnosis not present

## 2023-07-26 DIAGNOSIS — G4733 Obstructive sleep apnea (adult) (pediatric): Secondary | ICD-10-CM

## 2023-07-26 DIAGNOSIS — I25111 Atherosclerotic heart disease of native coronary artery with angina pectoris with documented spasm: Secondary | ICD-10-CM

## 2023-07-26 DIAGNOSIS — E1169 Type 2 diabetes mellitus with other specified complication: Secondary | ICD-10-CM | POA: Diagnosis not present

## 2023-07-26 DIAGNOSIS — E6609 Other obesity due to excess calories: Secondary | ICD-10-CM | POA: Diagnosis not present

## 2023-07-26 DIAGNOSIS — E785 Hyperlipidemia, unspecified: Secondary | ICD-10-CM | POA: Diagnosis not present

## 2023-07-26 DIAGNOSIS — J439 Emphysema, unspecified: Secondary | ICD-10-CM | POA: Diagnosis not present

## 2023-07-26 DIAGNOSIS — E1159 Type 2 diabetes mellitus with other circulatory complications: Secondary | ICD-10-CM

## 2023-07-26 DIAGNOSIS — N1831 Chronic kidney disease, stage 3a: Secondary | ICD-10-CM | POA: Diagnosis not present

## 2023-07-26 LAB — BAYER DCA HB A1C WAIVED: HB A1C (BAYER DCA - WAIVED): 6.3 % — ABNORMAL HIGH (ref 4.8–5.6)

## 2023-07-26 MED ORDER — GABAPENTIN 600 MG PO TABS
ORAL_TABLET | ORAL | 4 refills | Status: DC
Start: 2023-07-26 — End: 2023-08-08

## 2023-07-26 MED ORDER — RAMIPRIL 5 MG PO CAPS: 5.0000 mg | ORAL_CAPSULE | Freq: Every day | ORAL | 4 refills | Status: DC

## 2023-07-26 MED ORDER — METFORMIN HCL ER 500 MG PO TB24: 500.0000 mg | ORAL_TABLET | Freq: Two times a day (BID) | ORAL | 4 refills | Status: DC

## 2023-07-26 MED ORDER — ATORVASTATIN CALCIUM 80 MG PO TABS: ORAL_TABLET | ORAL | 4 refills | Status: DC

## 2023-07-26 MED ORDER — EZETIMIBE 10 MG PO TABS: 10.0000 mg | ORAL_TABLET | Freq: Every day | ORAL | 4 refills | Status: DC

## 2023-07-26 MED ORDER — FUROSEMIDE 20 MG PO TABS
ORAL_TABLET | ORAL | 4 refills | Status: DC
Start: 2023-07-26 — End: 2023-08-08

## 2023-07-26 MED ORDER — ISOSORBIDE MONONITRATE ER 60 MG PO TB24: ORAL_TABLET | ORAL | 4 refills | Status: DC

## 2023-07-26 MED ORDER — DILTIAZEM HCL ER COATED BEADS 180 MG PO CP24: 180.0000 mg | ORAL_CAPSULE | Freq: Every day | ORAL | 4 refills | Status: DC

## 2023-07-26 NOTE — Assessment & Plan Note (Signed)
Chronic, no use of CPAP.  Recommend he use 100% of the time.

## 2023-07-26 NOTE — Assessment & Plan Note (Signed)
Chronic, ongoing.  History of smoking.  Levels >80%.  Continue current inhaler regimen and adjust as needed.  Recommend he consistently use his Breztri and not miss doses as may benefit his SOB, educated him on this.  Recommend he reschedule with pulmonary for further testing due to SOB.

## 2023-07-26 NOTE — Assessment & Plan Note (Signed)
Chronic, ongoing.  Continue Gabapentin at HS, which offers benefit.  Continue B12 supplement at home.

## 2023-07-26 NOTE — Assessment & Plan Note (Signed)
BMI 32.49 with 16 pounds loss present, underlying T2DM, HTN/HLD.  Recommended eating smaller high protein, low fat meals more frequently and exercising 30 mins a day 5 times a week with a goal of 10-15lb weight loss in the next 3 months. Patient voiced their understanding and motivation to adhere to these recommendations.

## 2023-07-26 NOTE — Assessment & Plan Note (Addendum)
Chronic, ongoing.  Continue collaboration with cardiology, recent note reviewed.  Minimal use of NTG.

## 2023-07-26 NOTE — Assessment & Plan Note (Signed)
Chronic, stable.  BP well below goal at home and in office.  Has some dizziness with position changes, suspect BP may be too soft. Will trial reducing Ramipril to 5 MG and maintaining rest of regimen as is.  Recommend he monitor BP at least a few mornings a week at home and document.  DASH diet at home.  Labs today: CMP.  Return in 5 weeks for BP check.

## 2023-07-26 NOTE — Assessment & Plan Note (Signed)
Chronic, ongoing.  A1c today 6.3% (no change), major trend down with GLP1 on board and has had weight loss of 16 pounds. Check urine ALB 30 February 2024, continue Ramipril for kidney protection.  Check BS 2-3 times a day, he is noticing a down trend in sugar levels.  Recommend heavy focus on diabetic diet and maintain current regimen at this time.  Return in 3 months. Praised for this success.  Continue to work with CCM team, new referral for pharmacist placed. - ACE and statin on board - Foot exam up to date, recommend he schedule eye exam - Vaccines up to date

## 2023-07-26 NOTE — Assessment & Plan Note (Signed)
Chronic, ongoing.  Continue current medication regimen and adjust as needed. Lipid panel today. 

## 2023-07-26 NOTE — Progress Notes (Signed)
BP (!) 104/57   Pulse 72   Temp 98 F (36.7 C) (Oral)   Ht 5\' 10"  (1.778 m)   Wt 226 lb 6.4 oz (102.7 kg)   SpO2 97%   BMI 32.49 kg/m    Subjective:    Patient ID: Ernest [name], male    DOB: 08-02-1951, 72 y.o.   MRN: [id number]  HPI: Ernest [name] is a 72 y.o. male  Chief Complaint  Patient presents with   Chronic Kidney Disease   COPD   Gastroesophageal Reflux   Diabetes    Pt states he has not had a recent eye exam    Hyperlipidemia   Hypertension   DIABETES A1c May was 6.3%.  Continues on Metformin XR 500 MG BID, Ozempic 0.5 MG weekly (prefers this over Trulicity as easier injection), and Farxiga 10 MG daily.  Higher doses of Metformin cause nausea.  Worked with [facility] pharmacist in past for medications and assistance -- [employer].  Gabapentin 1200 MG for RLS at night taken, without it can not sleep.   Hypoglycemic episodes:no Polydipsia/polyuria: no Visual disturbance: no Chest pain: no Paresthesias: no Glucose Monitoring: yes  Accucheck frequency: not checking  Fasting glucose:   Post prandial:  Evening:  Before meals: Taking Insulin?: no  Long acting insulin:  Short acting insulin: Blood Pressure Monitoring: weekly Retinal Examination: Not Up to Date -- [employer] Foot Exam: Up to Date Pneumovax: Up to Date Influenza: Up to Date Aspirin: no   HYPERTENSION / HYPERLIPIDEMIA Saw cardiology last on 04/11/23, they referred him to pulmonary due to ongoing SOB and overall reassuring cardiac work-up.  He has not had a visit with pulmonary as of yet -- he forgot about visit that was scheduled.  Continues on Imdur, Lasix, Cardizem, Zetia, Atorvastatin, Ramipril, Metoprolol.    CPAP is at home, but does not use.  Was waking up with it on floor all the time, even strapping to his head it would be off face. Satisfied with current treatment? yes Duration of hypertension: chronic BP monitoring frequency: occasionally BP range: <120/80 range   BP medication side effects: no Duration of hyperlipidemia: chronic Cholesterol medication side effects: no Cholesterol supplements: none Medication compliance: good compliance Aspirin: no Recent stressors: no Recurrent headaches: no Visual changes: no Palpitations: no Dyspnea: occasional with exertion Chest pain: had episode last week, went away on own with no NTG use Lower extremity edema: no Dizzy/lightheaded: with changing positions often  ATRIAL FIBRILLATION Continues on Xarelto and Metoprolol. Atrial fibrillation status: stable Satisfied with current treatment: yes  Medication side effects:  no Medication compliance: good compliance Etiology of atrial fibrillation:  Palpitations:   occasional Chest pain:  no Dyspnea on exertion:  no Orthopnea:  no Syncope:  no Edema:  no Ventricular rate control: B-blocker Anti-coagulation: long acting   CHRONIC KIDNEY DISEASE (Stage 3a) Last two lab checks function has been stable with no decline. CKD status: stable Medications renally dose: yes Previous renal evaluation: no Pneumovax:  Up to Date Influenza Vaccine:  Up to Date   COPD Continues on Breztri and Albuterol -- does not use Breztri twice a day as gets too busy and does not have time to rinse mouth out. Was a smoker 30 years ago, smoked about 4 PPD -- got hypnotized and quit. COPD status: stable Satisfied with current treatment?: yes Oxygen use: no Dyspnea frequency: no Cough frequency: no Rescue inhaler frequency:   Limitation of activity: no Productive cough: no Last Spirometry: 07/04/22 ---  FEV1/FVC >80% and FEV1 similar Pneumovax: Up to Date Influenza: Up to Date   Relevant past medical, surgical, family and social history reviewed and updated as indicated. Interim medical history since our last visit reviewed. Allergies and medications reviewed and updated.  Review of Systems  Constitutional:  Negative for activity change, diaphoresis, fatigue and fever.   Respiratory:  Negative for cough, chest tightness, shortness of breath and wheezing.   Cardiovascular:  Negative for chest pain, palpitations and leg swelling.  Gastrointestinal: Negative.   Endocrine: Negative for cold intolerance, heat intolerance, polydipsia, polyphagia and polyuria.  Neurological: Negative.   Psychiatric/Behavioral: Negative.     Per HPI unless specifically indicated above     Objective:    BP (!) 104/57   Pulse 72   Temp 98 F (36.7 C) (Oral)   Ht 5\' 10"  (1.778 m)   Wt 226 lb 6.4 oz (102.7 kg)   SpO2 97%   BMI 32.49 kg/m   Wt Readings from Last 3 Encounters:  07/26/23 226 lb 6.4 oz (102.7 kg)  04/25/23 232 lb 4.8 oz (105.4 kg)  04/11/23 235 lb (106.6 kg)    Physical Exam Vitals and nursing note reviewed.  Constitutional:      General: He is awake. He is not in acute distress.    Appearance: He is well-developed and well-groomed. He is obese. He is not ill-appearing or toxic-appearing.  HENT:     Head: Normocephalic.     Right Ear: Hearing and external ear normal.     Left Ear: Hearing and external ear normal.  Eyes:     General: Lids are normal.     Extraocular Movements: Extraocular movements intact.     Conjunctiva/sclera: Conjunctivae normal.  Neck:     Thyroid: No thyromegaly.     Vascular: No carotid bruit.  Cardiovascular:     Rate and Rhythm: Normal rate and regular rhythm.     Heart sounds: Normal heart sounds.  Pulmonary:     Effort: Pulmonary effort is normal. No accessory muscle usage or respiratory distress.     Breath sounds: Normal breath sounds.  Abdominal:     General: Bowel sounds are normal. There is no distension.     Palpations: Abdomen is soft.     Tenderness: There is no abdominal tenderness.  Musculoskeletal:     Cervical back: Full passive range of motion without pain.     Right lower leg: No edema.     Left lower leg: No edema.  Lymphadenopathy:     Cervical: No cervical adenopathy.  Skin:    General: Skin is  warm.     Capillary Refill: Capillary refill takes less than 2 seconds.  Neurological:     Mental Status: He is alert and oriented to person, place, and time.     Deep Tendon Reflexes: Reflexes are normal and symmetric.     Reflex Scores:      Brachioradialis reflexes are 2+ on the right side and 2+ on the left side.      Patellar reflexes are 2+ on the right side and 2+ on the left side. Psychiatric:        Attention and Perception: Attention normal.        Mood and Affect: Mood normal.        Speech: Speech normal.        Behavior: Behavior normal. Behavior is cooperative.        Thought Content: Thought content normal.    Results for  orders placed or performed in visit on 04/25/23  Bayer DCA Hb A1c Waived  Result Value Ref Range   HB A1C (BAYER DCA - WAIVED) 6.3 (H) 4.8 - 5.6 %  Comprehensive metabolic panel  Result Value Ref Range   Glucose 121 (H) 70 - 99 mg/dL   BUN 11 8 - 27 mg/dL   Creatinine, Ser 4.09 0.76 - 1.27 mg/dL   eGFR 61 >81 XB/JYN/8.29   BUN/Creatinine Ratio 9 (L) 10 - 24   Sodium 145 (H) 134 - 144 mmol/L   Potassium 3.8 3.5 - 5.2 mmol/L   Chloride 105 96 - 106 mmol/L   CO2 22 20 - 29 mmol/L   Calcium 9.6 8.6 - 10.2 mg/dL   Total Protein 6.4 6.0 - 8.5 g/dL   Albumin 4.4 3.8 - 4.8 g/dL   Globulin, Total 2.0 1.5 - 4.5 g/dL   Albumin/Globulin Ratio 2.2 1.2 - 2.2   Bilirubin Total 1.3 (H) 0.0 - 1.2 mg/dL   Alkaline Phosphatase 87 44 - 121 IU/L   AST 20 0 - 40 IU/L   ALT 17 0 - 44 IU/L  Lipid Panel w/o Chol/HDL Ratio  Result Value Ref Range   Cholesterol, Total 60 (L) 100 - 199 mg/dL   Triglycerides 90 0 - 149 mg/dL   HDL 32 (L) >56 mg/dL   VLDL Cholesterol Cal 18 5 - 40 mg/dL   LDL Chol Calc (NIH) 10 0 - 99 mg/dL  VITAMIN D 25 Hydroxy (Vit-D Deficiency, Fractures)  Result Value Ref Range   Vit D, 25-Hydroxy 69.7 30.0 - 100.0 ng/mL  Vitamin B12  Result Value Ref Range   Vitamin B-12 811 232 - 1,245 pg/mL      Assessment & Plan:   Problem List  Items Addressed This Visit       Cardiovascular and Mediastinum   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  Continue collaboration with cardiology, recent note reviewed.  Minimal use of NTG.      Relevant Medications   ramipril (ALTACE) 5 MG capsule   ezetimibe (ZETIA) 10 MG tablet   diltiazem (CARDIZEM CD) 180 MG 24 hr capsule   atorvastatin (LIPITOR) 80 MG tablet   furosemide (LASIX) 20 MG tablet   isosorbide mononitrate (IMDUR) 60 MG 24 hr tablet   Other Relevant Orders   Comprehensive metabolic panel   Lipid Panel w/o Chol/HDL Ratio   Atrial fibrillation (HCC)    Chronic, ongoing.  Rate controlled.  Continue collaboration with cardiology and current medication regimen as ordered by them.  Recent note reviewed.      Relevant Medications   ramipril (ALTACE) 5 MG capsule   ezetimibe (ZETIA) 10 MG tablet   diltiazem (CARDIZEM CD) 180 MG 24 hr capsule   atorvastatin (LIPITOR) 80 MG tablet   furosemide (LASIX) 20 MG tablet   isosorbide mononitrate (IMDUR) 60 MG 24 hr tablet   Other Relevant Orders   Comprehensive metabolic panel   Amb Referral to Clinical Pharmacist   Hypertension associated with diabetes (HCC)    Chronic, stable.  BP well below goal at home and in office.  Has some dizziness with position changes, suspect BP may be too soft. Will trial reducing Ramipril to 5 MG and maintaining rest of regimen as is.  Recommend he monitor BP at least a few mornings a week at home and document.  DASH diet at home.  Labs today: CMP.  Return in 5 weeks for BP check.       Relevant Medications   ramipril (ALTACE) 5 MG capsule   ezetimibe (ZETIA) 10 MG tablet   diltiazem (CARDIZEM CD) 180 MG 24 hr capsule   atorvastatin (LIPITOR) 80 MG tablet   furosemide (LASIX) 20 MG tablet   metFORMIN (GLUCOPHAGE-XR) 500 MG 24 hr tablet   isosorbide mononitrate (IMDUR) 60 MG 24 hr tablet   Other Relevant  Orders   Bayer DCA Hb A1c Waived   Comprehensive metabolic panel   Amb Referral to Clinical Pharmacist     Respiratory   COPD (chronic obstructive pulmonary disease) with emphysema (HCC)    Chronic, ongoing.  History of smoking.  Levels >80%.  Continue current inhaler regimen and adjust as needed.  Recommend he consistently use his Breztri and not miss doses as may benefit his SOB, educated him on this.  Recommend he reschedule with pulmonary for further testing due to SOB.      OSA on CPAP    Chronic, no use of CPAP.  Recommend he use 100% of the time.        Endocrine   Hyperlipidemia associated with type 2 diabetes mellitus (HCC)    Chronic, ongoing.  Continue current medication regimen and adjust as needed.  Lipid panel today.         Relevant Medications   ramipril (ALTACE) 5 MG capsule   ezetimibe (ZETIA) 10 MG tablet   diltiazem (CARDIZEM CD) 180 MG 24 hr capsule   atorvastatin (LIPITOR) 80 MG tablet   furosemide (LASIX) 20 MG tablet   metFORMIN (GLUCOPHAGE-XR) 500 MG 24 hr tablet   isosorbide mononitrate (IMDUR) 60 MG 24 hr tablet   Other Relevant Orders   Bayer DCA Hb A1c Waived   Comprehensive metabolic panel   Lipid Panel w/o Chol/HDL Ratio   Amb Referral to Clinical Pharmacist   Type 2 diabetes mellitus with morbid obesity (HCC) - Primary    Chronic, ongoing.  A1c today 6.3% (no change), major trend down with GLP1 on board and has had weight loss of 16 pounds. Check urine ALB 30 February 2024, continue Ramipril for kidney protection.  Check BS 2-3 times a day, he is noticing a down trend in sugar levels.  Recommend heavy focus on diabetic diet and maintain current regimen at this time.  Return in 3 months. Praised for this success.  Continue to work with CCM team, new referral for pharmacist placed. - ACE and statin on board - Foot exam up to date, recommend he schedule eye exam - Vaccines up to date      Relevant Medications   ramipril (ALTACE) 5 MG capsule    atorvastatin (LIPITOR) 80 MG tablet   metFORMIN (GLUCOPHAGE-XR) 500 MG 24 hr tablet   Other Relevant Orders   Bayer DCA Hb A1c Waived   Amb Referral to Clinical Pharmacist     Genitourinary   Stage 3a chronic kidney disease (CKD) (HCC)    Chronic, ongoing - recent labs normal range.  At this time continue Ramipril for kidney protection.  CMP today.      Relevant Orders   Comprehensive metabolic panel     Other   Obesity    BMI 32.49 with 16 pounds loss present, underlying T2DM, HTN/HLD.  Recommended eating smaller high protein, low fat meals more frequently and exercising 30 mins a day 5 times a week with a goal of 10-15lb weight loss in the next 3 months. Patient voiced their  understanding and motivation to adhere to these recommendations.       Relevant Medications   metFORMIN (GLUCOPHAGE-XR) 500 MG 24 hr tablet   Restless legs    Chronic, ongoing.  Continue Gabapentin at HS, which offers benefit.  Continue B12 supplement at home.      Relevant Medications   gabapentin (NEURONTIN) 600 MG tablet     Follow up plan: Return in about 5 weeks (around 08/30/2023) for HTN -- reduce Ramipril + 3 months for T2DM, HTN/HLD, CKD, COPD, A-FIB.

## 2023-07-26 NOTE — Assessment & Plan Note (Signed)
Chronic, ongoing.  Rate controlled.  Continue collaboration with cardiology and current medication regimen as ordered by them.  Recent note reviewed.

## 2023-07-26 NOTE — Assessment & Plan Note (Signed)
Chronic, ongoing - recent labs normal range.  At this time continue Ramipril for kidney protection.  CMP today.

## 2023-07-27 ENCOUNTER — Telehealth: Payer: Self-pay

## 2023-07-27 ENCOUNTER — Ambulatory Visit: Payer: Self-pay | Admitting: *Deleted

## 2023-07-27 NOTE — Progress Notes (Signed)
   Care Guide Note  07/27/2023 Name: Ernest [name] MRN: [id number] DOB: [birth year]  Referred by: Marjie Skiff, NP Reason for referral : Care Management (Outreach to schedule with pharm d )   Ernest [name] is a 72 y.o. year old male who is a primary care patient of Cannady, Dorie Rank, NP. [name] was referred to the pharmacist for assistance related to DM.    Successful contact was made with the patient to discuss pharmacy services including being ready for the pharmacist to call at least 5 minutes before the scheduled appointment time, to have medication bottles and any blood sugar or blood pressure readings ready for review. The patient agreed to meet with the pharmacist via with the pharmacist via telephone visit on (date/time).  08/13/2023  [name], RMA Care Guide [name]  [place], [place] [address] Direct Dial: [phone] [email or web address]

## 2023-07-27 NOTE — Progress Notes (Signed)
Error

## 2023-07-27 NOTE — Patient Outreach (Addendum)
  Care Coordination   Follow Up Visit Note   07/27/2023 Name: Ernest [name] MRN: [id number] DOB: [birth year]  Ernest [name] is a 72 y.o. year old male who sees Ernest Dials T, NP for primary care. I spoke with  Ernest [name] by phone today.  What matters to the patients health and wellness today?  Keep A1C at goal and stabilize blood pressure.   Update @ 1435:  Patient notified that per PCP, prescription for Ramipril 5mg  was sent to pharmacy yesterday and should be ready for pick up.   Goals Addressed             This Visit's Progress    Effective management of DM   On track    Care Coordination Interventions: Provided education to patient about basic DM disease process Reviewed medications with patient and discussed importance of medication adherence Discussed plans with patient for ongoing care management follow up and provided patient with direct contact information for care management team Advised patient, providing education and rationale, to check cbg daily and record, calling PCP for findings outside established parameters        SDOH assessments and interventions completed:  No     Care Coordination Interventions:  Yes, provided   Interventions Today    Flowsheet Row Most Recent Value  Chronic Disease   Chronic disease during today's visit Diabetes, Hypertension (HTN)  General Interventions   General Interventions Discussed/Reviewed General Interventions Reviewed, Durable Medical Equipment (DME), Doctor Visits, Communication with  Doctor Visits Discussed/Reviewed Doctor Visits Reviewed, PCP  [PCP for BP recheck on 9/11]  Durable Medical Equipment (DME) BP Cuff  PCP/Specialist Visits Compliance with follow-up visit  [PCP visit done yesterday]  Communication with PCP/Specialists  [facility] sent to PCP notifying that Ramipril can'[name] be broken in half to 5 mg.  Inquired about new prescription versus not taking medication at all]  Education  Interventions   Education Provided Provided Education  Provided Verbal Education On Other, Medication, When to see the doctor, Nutrition, Labs  [Monitoring blood pressure daily and recording trends to share with provider.  Ramipril has been decreased from 10mg  to 5mg , however patient didn'[name] take today because it is a capsule and can not be broken in half.]  Labs Reviewed Hgb A1c  [Currently 6.3, less than goal]        Follow up plan: Follow up call scheduled for 8/16    Encounter Outcome:  Pt. Visit Completed   Ernest Durie, RN, MSN, [facility] Care Management Care Management Coordinator [phone]

## 2023-07-27 NOTE — Progress Notes (Signed)
Good afternoon, please let Ernest [name] know his labs have returned and are overall stable.  Kidney and liver function good.  Cholesterol levels well at goal.  No medication changes needed.  Any questions? Keep being amazing!!  Thank you for allowing me to participate in your care.  I appreciate you. Kindest regards, 

## 2023-08-03 ENCOUNTER — Ambulatory Visit: Payer: Self-pay | Admitting: *Deleted

## 2023-08-03 NOTE — Patient Outreach (Signed)
  Care Coordination   08/03/2023 Name: Ernest [name] MRN: [id number] DOB: 10-27-1951   Care Coordination Outreach Attempts:  An unsuccessful telephone outreach was attempted for a scheduled appointment today.  Follow Up Plan:  Additional outreach attempts will be made to offer the patient care coordination information and services.   Encounter Outcome:  No Answer   Care Coordination Interventions:  No, not indicated    Kemper Durie, RN, MSN, [facility] Care Management Care Management Coordinator [phone]

## 2023-08-03 NOTE — Patient Outreach (Signed)
  Care Coordination   Follow Up Visit Note   08/03/2023 Name: Ernest [name] MRN: [id number] DOB: Apr 21, 1951  Ernest [name] is a 72 y.o. year old male who sees Aura Dials T, NP for primary care. I spoke with  [name] by phone today.  What matters to the patients health and wellness today?  Keep blood pressure and blood sugar managed     Goals Addressed             This Visit's Progress    Effective management of DM   On track    Care Coordination Interventions: Provided education to patient about basic DM disease process Reviewed medications with patient and discussed importance of medication adherence Discussed plans with patient for ongoing care management follow up and provided patient with direct contact information for care management team Advised patient, providing education and rationale, to check cbg daily and record, calling PCP for findings outside established parameters        SDOH assessments and interventions completed:  No     Care Coordination Interventions:  Yes, provided   Interventions Today    Flowsheet Row Most Recent Value  Chronic Disease   Chronic disease during today's visit Hypertension (HTN)  General Interventions   General Interventions Discussed/Reviewed General Interventions Reviewed, Doctor Visits  Doctor Visits Discussed/Reviewed PCP, Doctor Visits Reviewed  [pharmacy visit 8/21, PCP 9/11]  Durable Medical Equipment (DME) BP Cuff  Education Interventions   Education Provided Provided Education  Provided Verbal Education On Medication, When to see the doctor, Other  [Advised again to pick up Ramipril 5mg  from pharmacy and to continue checking blood pressure.  Today was 107/80]       Follow up plan: Follow up call scheduled for 9/13    Encounter Outcome:  Pt. Visit Completed   Kemper Durie, RN, MSN, [facility] Care Management Care Management Coordinator [phone]

## 2023-08-07 DIAGNOSIS — I2510 Atherosclerotic heart disease of native coronary artery without angina pectoris: Secondary | ICD-10-CM | POA: Diagnosis not present

## 2023-08-07 DIAGNOSIS — I252 Old myocardial infarction: Secondary | ICD-10-CM | POA: Diagnosis not present

## 2023-08-07 DIAGNOSIS — I4891 Unspecified atrial fibrillation: Secondary | ICD-10-CM | POA: Diagnosis not present

## 2023-08-07 DIAGNOSIS — D6869 Other thrombophilia: Secondary | ICD-10-CM | POA: Diagnosis not present

## 2023-08-07 DIAGNOSIS — E669 Obesity, unspecified: Secondary | ICD-10-CM | POA: Diagnosis not present

## 2023-08-07 DIAGNOSIS — K2270 Barrett's esophagus without dysplasia: Secondary | ICD-10-CM | POA: Diagnosis not present

## 2023-08-07 DIAGNOSIS — M1990 Unspecified osteoarthritis, unspecified site: Secondary | ICD-10-CM | POA: Diagnosis not present

## 2023-08-07 DIAGNOSIS — E1142 Type 2 diabetes mellitus with diabetic polyneuropathy: Secondary | ICD-10-CM | POA: Diagnosis not present

## 2023-08-07 DIAGNOSIS — K08109 Complete loss of teeth, unspecified cause, unspecified class: Secondary | ICD-10-CM | POA: Diagnosis not present

## 2023-08-07 DIAGNOSIS — I10 Essential (primary) hypertension: Secondary | ICD-10-CM | POA: Diagnosis not present

## 2023-08-07 DIAGNOSIS — K219 Gastro-esophageal reflux disease without esophagitis: Secondary | ICD-10-CM | POA: Diagnosis not present

## 2023-08-07 DIAGNOSIS — E785 Hyperlipidemia, unspecified: Secondary | ICD-10-CM | POA: Diagnosis not present

## 2023-08-08 ENCOUNTER — Other Ambulatory Visit (HOSPITAL_COMMUNITY): Payer: Self-pay

## 2023-08-08 ENCOUNTER — Other Ambulatory Visit: Payer: Medicare HMO

## 2023-08-08 ENCOUNTER — Encounter: Payer: Self-pay | Admitting: Oncology

## 2023-08-08 DIAGNOSIS — I4811 Longstanding persistent atrial fibrillation: Secondary | ICD-10-CM

## 2023-08-08 DIAGNOSIS — G2581 Restless legs syndrome: Secondary | ICD-10-CM

## 2023-08-08 MED ORDER — NITROGLYCERIN 0.4 MG SL SUBL
0.4000 mg | SUBLINGUAL_TABLET | SUBLINGUAL | 2 refills | Status: AC | PRN
  Filled 2023-08-08: qty 25, 30d supply, fill #0
  Filled 2023-11-26: qty 25, 1d supply, fill #0

## 2023-08-08 MED ORDER — DILTIAZEM HCL ER COATED BEADS 180 MG PO CP24
180.0000 mg | ORAL_CAPSULE | Freq: Every day | ORAL | 4 refills | Status: DC
  Filled 2023-08-08 – 2023-08-22 (×2): qty 90, 90d supply, fill #0
  Filled 2023-11-26: qty 90, 90d supply, fill #1
  Filled 2024-02-25: qty 90, 90d supply, fill #2
  Filled 2024-05-28: qty 90, 90d supply, fill #3

## 2023-08-08 MED ORDER — EZETIMIBE 10 MG PO TABS
10.0000 mg | ORAL_TABLET | Freq: Every day | ORAL | 4 refills | Status: DC
  Filled 2023-08-08 – 2023-08-22 (×2): qty 90, 90d supply, fill #0
  Filled 2023-11-26: qty 90, 90d supply, fill #1
  Filled 2024-02-25: qty 90, 90d supply, fill #2
  Filled 2024-05-28: qty 90, 90d supply, fill #3

## 2023-08-08 MED ORDER — CHOLECALCIFEROL 1.25 MG (50000 UT) PO TABS
1.0000 | ORAL_TABLET | ORAL | 4 refills | Status: DC
  Filled 2023-08-08: qty 12, fill #0

## 2023-08-08 MED ORDER — RIVAROXABAN 20 MG PO TABS
20.0000 mg | ORAL_TABLET | Freq: Every evening | ORAL | 4 refills | Status: DC
  Filled 2023-08-08 – 2023-08-21 (×2): qty 30, 30d supply, fill #0
  Filled 2023-09-25: qty 30, 30d supply, fill #1
  Filled 2023-10-30: qty 30, 30d supply, fill #2
  Filled 2023-11-26: qty 30, 30d supply, fill #3
  Filled 2024-01-07: qty 30, 30d supply, fill #4

## 2023-08-08 MED ORDER — FUROSEMIDE 20 MG PO TABS
ORAL_TABLET | ORAL | 4 refills | Status: DC
  Filled 2023-08-08 – 2023-08-22 (×2): qty 180, 90d supply, fill #0
  Filled 2024-02-25: qty 180, 90d supply, fill #1
  Filled 2024-05-28: qty 180, 90d supply, fill #2

## 2023-08-08 MED ORDER — PANTOPRAZOLE SODIUM 40 MG PO TBEC
40.0000 mg | DELAYED_RELEASE_TABLET | Freq: Every day | ORAL | 4 refills | Status: DC
  Filled 2023-08-08 – 2023-08-22 (×2): qty 90, 90d supply, fill #0
  Filled 2023-11-26: qty 90, 90d supply, fill #1
  Filled 2024-02-25: qty 90, 90d supply, fill #2
  Filled 2024-05-28: qty 90, 90d supply, fill #3

## 2023-08-08 MED ORDER — METFORMIN HCL ER 500 MG PO TB24
500.0000 mg | ORAL_TABLET | Freq: Two times a day (BID) | ORAL | 4 refills | Status: DC
  Filled 2023-08-08 – 2023-08-22 (×2): qty 200, 100d supply, fill #0
  Filled 2023-11-26: qty 200, 100d supply, fill #1
  Filled 2024-02-25: qty 200, 100d supply, fill #2
  Filled 2024-05-28: qty 200, 100d supply, fill #3

## 2023-08-08 MED ORDER — ISOSORBIDE MONONITRATE ER 60 MG PO TB24
ORAL_TABLET | ORAL | 4 refills | Status: DC
  Filled 2023-08-08 – 2023-08-22 (×2): qty 90, 90d supply, fill #0
  Filled 2023-11-26: qty 90, 90d supply, fill #1
  Filled 2024-02-25: qty 90, 90d supply, fill #2
  Filled 2024-05-28: qty 90, 90d supply, fill #3

## 2023-08-08 MED ORDER — METOPROLOL SUCCINATE ER 50 MG PO TB24
ORAL_TABLET | ORAL | 4 refills | Status: DC
  Filled 2023-08-08 – 2023-08-22 (×2): qty 90, 90d supply, fill #0
  Filled 2023-11-26: qty 90, 90d supply, fill #1
  Filled 2024-02-25: qty 90, 90d supply, fill #2

## 2023-08-08 MED ORDER — RAMIPRIL 5 MG PO CAPS
5.0000 mg | ORAL_CAPSULE | Freq: Every day | ORAL | 4 refills | Status: DC
  Filled 2023-08-08 – 2023-08-21 (×3): qty 90, 90d supply, fill #0

## 2023-08-08 MED ORDER — ATORVASTATIN CALCIUM 80 MG PO TABS
80.0000 mg | ORAL_TABLET | Freq: Every morning | ORAL | 4 refills | Status: DC
  Filled 2023-08-08: qty 90, 90d supply, fill #0

## 2023-08-08 MED ORDER — ALBUTEROL SULFATE HFA 108 (90 BASE) MCG/ACT IN AERS
2.0000 | INHALATION_SPRAY | RESPIRATORY_TRACT | 4 refills | Status: DC | PRN
  Filled 2023-08-08: qty 6.7, 17d supply, fill #0

## 2023-08-08 MED ORDER — GABAPENTIN 600 MG PO TABS
ORAL_TABLET | ORAL | 4 refills | Status: DC
Start: 2023-08-08 — End: 2024-08-04
  Filled 2023-08-08 – 2023-08-22 (×2): qty 180, 90d supply, fill #0
  Filled 2023-11-26: qty 180, 90d supply, fill #1
  Filled 2024-02-25: qty 180, 90d supply, fill #2
  Filled 2024-05-28: qty 180, 90d supply, fill #3

## 2023-08-08 MED ORDER — CLOTRIMAZOLE-BETAMETHASONE 1-0.05 % EX CREA
TOPICAL_CREAM | Freq: Two times a day (BID) | CUTANEOUS | 2 refills | Status: DC
  Filled 2023-08-08: qty 30, 15d supply, fill #0

## 2023-08-08 NOTE — Progress Notes (Signed)
08/08/2023 Name: Ernest [name] MRN: [id number] DOB: 1951-02-12  Chief Complaint  Patient presents with   Medication Management   Ernest [name] is [name] 72 y.o. year old male who presented for [name] telephone visit.   They were referred to the pharmacist by their PCP for assistance in managing medication access.   Subjective:  Care Team: Primary Care Provider: Marjie Skiff, [name] ; Next Scheduled Visit: 9/11  Medication Access/Adherence  Current Pharmacy:  [facility] [address] [place] [address] Phone: [phone] Fax: [phone]  -Patient reports affordability concerns with their medications: Yes -Xarelto is very expensive due to being in the donut hole  -Patient reports access/transportation concerns to their pharmacy: No  -Patient reports adherence concerns with their medications:  Yes  -has not yet received ramipril 5mg  from pharmacy.  [facility] where patient's prescriptions currently are is in the process of closing/changing ownership and are not refilling medications at this time per patient.  Diabetes: Current medications: Farxiga 10mg  daily, metformin XR 500mg  BID, Ozempic 0.5mg  weekly  -Patient uses Accu Check Guide for home BG testing, but he does not check regularly and could not provide readings -Patient denies hypoglycemic s/sx including dizziness, shakiness, sweating.  -Patient denies hyperglycemic symptoms including polyuria, polydipsia, polyphagia, nocturia, neuropathy, blurred vision. -Current medication access support: Novo PAP for Ozempic, [place]&Me for Farxiga (and [employer])  Hypertension: Current medications: diltiazem CD 180mg  daily, furosemide 20mg  BID, isosorbide MN ER 60mg  daily, metoprolol succ 50mg  daily, ramipril 5mg  daily  -Patient does not regularly monitor home BP -Ramipril was recently decreased from 10mg  daily to 5mg  daily, but patient has not received this prescription yet.  Currently is not taking  ramipril. -Patient denies hypotensive s/sx including dizziness, lightheadedness.  -Patient denies hypertensive symptoms including headache, chest pain, shortness of breath  Objective: Lab Results  Component Value Date   HGBA1C 6.3 (H) 07/26/2023   Lab Results  Component Value Date   CREATININE 1.20 07/26/2023   BUN 18 07/26/2023   NA 144 07/26/2023   K 4.2 07/26/2023   CL 103 07/26/2023   CO2 26 07/26/2023   Lab Results  Component Value Date   CHOL 68 (L) 07/26/2023   HDL 31 (L) 07/26/2023   LDLCALC 16 07/26/2023   TRIG 109 07/26/2023   CHOLHDL 2.6 04/03/2022   Medications Reviewed Today     Reviewed by Ernest [name], RPH (Pharmacist) on 08/08/23 at 0847  Med List Status: <None>   Medication Order Taking? Sig Documenting Provider Last Dose Status Informant  ACCU-CHEK GUIDE test strip [id number] Yes Use to check BG daily [provider] Taking Active   Accu-Chek Softclix Lancets lancets [id number] Yes by Other route. Use to check BG daily [provider] Taking Active   albuterol (VENTOLIN HFA) 108 (90 Base) MCG/ACT inhaler [id number] Yes Inhale 2 puffs into the lungs every 4 (four) hours as needed for wheezing or shortness of breath. Uses 2-3 times daily Ernest [name] Taking Active   atorvastatin (LIPITOR) 80 MG tablet [id number] Yes TAKE ONE TABLET BY MOUTH EVERY MORNING [name], Ernest T, [name] Taking Active   Budeson-Glycopyrrol-Formoterol (BREZTRI AEROSPHERE) 160-9-4.8 MCG/ACT AERO [id number] Yes Inhale 2 puffs into the lungs 2 (two) times daily. Ernest Skiff, [name] Taking Active            Med Note Ernest [name], Ernest [name]   Wed Aug 08, 2023  8:39 AM) PAP  Cholecalciferol 1.25 MG (50000 UT) TABS [id number] Yes  Take 1 tablet by mouth once [name] week. Ernest [name] Taking Active   clotrimazole-betamethasone (LOTRISONE) cream [id number] Yes APPLY ONE application TOPICALLY twice daily AS DIRECTED [name], Ernest T, [name] Taking Active   dapagliflozin  propanediol (FARXIGA) 10 MG TABS tablet [id number] Yes Take 1 tablet (10 mg total) by mouth daily. Ernest [name] Taking Active            Med Note Ernest [name], Ernest [name]   Wed Aug 08, 2023  8:42 AM) PAP  diltiazem (CARDIZEM CD) 180 MG 24 hr capsule [id number] Yes Take 1 capsule (180 mg total) by mouth daily. Ernest [name] Taking Active   ezetimibe (ZETIA) 10 MG tablet [id number] Yes Take 1 tablet (10 mg total) by mouth daily. Ernest [name] Taking Active   furosemide (LASIX) 20 MG tablet [id number] Yes TAKE ONE TABLET BY MOUTH EVERY MORNING and TAKE ONE TABLET BY MOUTH EVERYDAY AT BEDTIME [name], Ernest T, [name] Taking Active   gabapentin (NEURONTIN) 600 MG tablet [id number] Yes TAKE TWO TABLETS BY MOUTH EVERYDAY AT BEDTIME [name], Ernest T, [name] Taking Active   isosorbide mononitrate (IMDUR) 60 MG 24 hr tablet [id number] Yes TAKE ONE TABLET BY MOUTH EVERY MORNING [name], Ernest T, [name] Taking Active   metFORMIN (GLUCOPHAGE-XR) 500 MG 24 hr tablet [id number] Yes Take 1 tablet (500 mg total) by mouth in the morning and at bedtime. 1 tab bid Ernest [name] Taking Active   metoprolol succinate (TOPROL-XL) 50 MG 24 hr tablet [id number] Yes TAKE 1 TABLET BY MOUTH ONCE DAILY WITH OR IMMEDIATELY FOLLOWING [name] MEAL [name], Ernest T, [name] Taking Active   nitroGLYCERIN (NITROSTAT) 0.4 MG SL tablet [id number] Yes Place 1 tablet (0.4 mg total) under the tongue every 5 (five) minutes as needed for chest pain. Ernest [name] Taking Active   pantoprazole (PROTONIX) 40 MG tablet [id number] Yes TAKE 1 TABLET(40 MG) BY MOUTH DAILY [name], Ernest T, [name] Taking Active   ramipril (ALTACE) 5 MG capsule [id number] No Take 1 capsule (5 mg total) by mouth daily.  Patient not taking: Reported on 08/08/2023   Ernest [name] Not Taking Active            Med Note Ernest [name], Ernest [name]   Wed Aug 08, 2023  8:37 AM) Has not received from [facility] yet  rivaroxaban (XARELTO) 20 MG TABS tablet [id number] Yes TAKE ONE  TABLET BY MOUTH EVERY EVENING [name], Ernest T, [name] Taking Active   Semaglutide,0.25 or 0.5MG /DOS, (OZEMPIC, 0.25 OR 0.5 MG/DOSE,) 2 MG/3ML SOPN [id number] Yes Inject 0.5 mg into the skin once [name] week. Ernest Skiff, [name] Taking Active            Med Note Ernest [name], Rhythm Wigfall [name]   Wed Aug 08, 2023  8:45 AM) PAP           Assessment/Plan:   Medication Access/Adherence -Patient would qualify for Xarelto with Me assistance program that would make his prescription $89/month, which is cheaper than current copay being in the donut hole.  Coordinating with medication assistance team to get patient enrolled.  He does only have 5 tablets remaining at this time.  Checking to see if [facility] has [name] sample; if not, he did endorse he could afford to fill another month through [name]. -Will add patient's current PAPs (Ozempic, Bernadene Bell) to spreadsheet to ensure medication assistance team can help with program renewals when due -Coordinating with [facility] and PCP to get all prescriptions not supplied  by PAP over to be delivered to patient's home.  Verified mailing address on file is correct.  Diabetes: - Currently controlled - Continue current regimen and regular follow-up with PCP at this time  Hypertension: - Currently controlled - Initiate ramipril 5mg  daily once received by [facility] - Continue other antihypertensive therapies and regular follow-up with PCP  Follow Up Plan: Will notify if [facility] has Xarelto samples, or another month will need to be filled by [facility] and will schedule follow-up visit for 3mos out when I call, also.  Ernest [name], PharmD, DPLA

## 2023-08-09 ENCOUNTER — Telehealth: Payer: Self-pay

## 2023-08-09 ENCOUNTER — Other Ambulatory Visit (HOSPITAL_COMMUNITY): Payer: Self-pay

## 2023-08-09 NOTE — Progress Notes (Signed)
   08/09/2023  Patient ID: Ernest [name], male   DOB: 08/05/1951, 71 y.o.   MRN: [id number]  Telephone follow-up to provide patient with the following information:  -Xarelto with Me program enrollment must be done by the patient, as a debit/credit card is required for the copayment of $89/month, then medication will be mailed directly to patient (rx will have to be sent from provider) -All prescriptions are now on file at [facility], and they are filling 1 month supply of his Xarelto to last until enrolled in assistance program.  They are also working on ramipril 5mg  daily. -I did request that he be set up for home delivery and verified his physical address with the pharmacy; but they may call him for card information for copays.  Patient does not use email, so he is going to get daughter's email address over the weekend for me to send the Xarelto with me program link to her to assist with enrollment.  Lenna Gilford, PharmD, DPLA

## 2023-08-11 ENCOUNTER — Other Ambulatory Visit (HOSPITAL_COMMUNITY): Payer: Self-pay

## 2023-08-13 ENCOUNTER — Other Ambulatory Visit: Payer: Medicare HMO

## 2023-08-13 ENCOUNTER — Other Ambulatory Visit (HOSPITAL_COMMUNITY): Payer: Self-pay

## 2023-08-14 ENCOUNTER — Telehealth: Payer: Self-pay

## 2023-08-14 ENCOUNTER — Other Ambulatory Visit (HOSPITAL_COMMUNITY): Payer: Self-pay

## 2023-08-14 NOTE — Progress Notes (Signed)
   08/14/2023  Patient ID: [name], male   DOB: September 03, 1951, 72 y.o.   MRN: [id number]  Patient outreach to follow-up on affordability assistance for Xarelto   -Patient would qualify for Xarelto With Me program to get the medication for $89/month, which would be affordable for him -Patient has to apply to the program themselves; so Mr. Levar provided me with his daughters email to send application information and link to:  [email or web address] -Patient also states [facility] pharmacy needing information from [facility] -Contacted pharmacy, and all prescriptions have been received- he just needs to call to provide billing information, so scripts can be filled when due and delivered  Email sent to daughter with Vonzella Nipple With Me application link, phone number to [facility] to set up billing, and my direct phone number in case additional needs arise.    Lenna Gilford, PharmD, DPLA

## 2023-08-17 ENCOUNTER — Other Ambulatory Visit (HOSPITAL_COMMUNITY): Payer: Self-pay

## 2023-08-21 ENCOUNTER — Telehealth: Payer: Self-pay

## 2023-08-21 ENCOUNTER — Other Ambulatory Visit (HOSPITAL_COMMUNITY): Payer: Self-pay

## 2023-08-21 NOTE — Progress Notes (Signed)
   08/21/2023  Patient ID: Ernest [name], male   DOB: [birth year], 72 y.o.   MRN: [id number]  Patient called stating he has been out of his Xarelto for 2-3 days now. Contacted [facility] to check on processing/delivery status, and the patient had not yet been set up for mail order due to not having billing information on file.  Pharmacy is going to reach out to the patient to get this information and set up delivery.  Lenna Gilford, PharmD, DPLA

## 2023-08-22 ENCOUNTER — Other Ambulatory Visit: Payer: Self-pay

## 2023-08-22 ENCOUNTER — Telehealth: Payer: Self-pay

## 2023-08-22 ENCOUNTER — Other Ambulatory Visit (HOSPITAL_COMMUNITY): Payer: Self-pay

## 2023-08-22 NOTE — Progress Notes (Addendum)
   08/22/2023  Patient ID: Ernest [name], male   DOB: 1951-08-25, 72 y.o.   MRN: [id number]  Patient outreach to inform Mr. Srinivasan that [facility] we will be delivering his Xarelto 20mg  tomorrow, and this will be placed on a charge account for him.  I also verified that his daughter received my email with application information for the Xarelto With Me Program, so he can enroll to receive this for $89/month versus $150.  She did receive the email, and they have my direct phone number if additional needs arise.  Lenna Gilford, PharmD, DPLA

## 2023-08-26 NOTE — Patient Instructions (Incomplete)
Stop Ramipril 5 MG once you get the Ramipril 2.5 MG -- start this when it comes.    Be Involved in Caring For Your Health:  Taking Medications When medications are taken as directed, they can greatly improve your health. But if they are not taken as prescribed, they may not work. In some cases, not taking them correctly can be harmful. To help ensure your treatment remains effective and safe, understand your medications and how to take them. Bring your medications to each visit for review by your provider.  Your lab results, notes, and after visit summary will be available on My Chart. We strongly encourage you to use this feature. If lab results are abnormal the clinic will contact you with the appropriate steps. If the clinic does not contact you assume the results are satisfactory. You can always view your results on My Chart. If you have questions regarding your health or results, please contact the clinic during office hours. You can also ask questions on My Chart.  We at [facility] are grateful that you chose [place] to provide your care. We strive to provide evidence-based and compassionate care and are always looking for feedback. If you get a survey from the clinic please complete this so we can hear your opinions.  DASH Eating Plan DASH stands for Dietary Approaches to Stop Hypertension. The DASH eating plan is a healthy eating plan that has been shown to: Lower high blood pressure (hypertension). Reduce your risk for type 2 diabetes, heart disease, and stroke. Help with weight loss. What are tips for following this plan? Reading food labels Check food labels for the amount of salt (sodium) per serving. Choose foods with less than 5 percent of the Daily Value (DV) of sodium. In general, foods with less than 300 milligrams (mg) of sodium per serving fit into this eating plan. To find whole grains, look for the word "whole" as the first word in the ingredient list. Shopping Buy  products labeled as "low-sodium" or "no salt added." Buy fresh foods. Avoid canned foods and pre-made or frozen meals. Cooking Try not to add salt when you cook. Use salt-free seasonings or herbs instead of table salt or sea salt. Check with your health care provider or pharmacist before using salt substitutes. Do not fry foods. Cook foods in healthy ways, such as baking, boiling, grilling, roasting, or broiling. Cook using oils that are good for your heart. These include olive, canola, avocado, soybean, and sunflower oil. Meal planning  Eat a balanced diet. This should include: 4 or more servings of fruits and 4 or more servings of vegetables each day. Try to fill half of your plate with fruits and vegetables. 6-8 servings of whole grains each day. 6 or less servings of lean meat, poultry, or fish each day. 1 oz is 1 serving. A 3 oz (85 g) serving of meat is about the same size as the palm of your hand. One egg is 1 oz (28 g). 2-3 servings of low-fat dairy each day. One serving is 1 cup (237 mL). 1 serving of nuts, seeds, or beans 5 times each week. 2-3 servings of heart-healthy fats. Healthy fats called omega-3 fatty acids are found in foods such as walnuts, flaxseeds, fortified milks, and eggs. These fats are also found in cold-water fish, such as sardines, salmon, and mackerel. Limit how much you eat of: Canned or prepackaged foods. Food that is high in trans fat, such as fried foods. Food that is high  in saturated fat, such as fatty meat. Desserts and other sweets, sugary drinks, and other foods with added sugar. Full-fat dairy products. Do not salt foods before eating. Do not eat more than 4 egg yolks a week. Try to eat at least 2 vegetarian meals a week. Eat more home-cooked food and less restaurant, buffet, and fast food. Lifestyle When eating at a restaurant, ask if your food can be made with less salt or no salt. If you drink alcohol: Limit how much you have to: 0-1 drink a day  if you are male. 0-2 drinks a day if you are male. Know how much alcohol is in your drink. In the [place] one drink is one 12 oz bottle of beer (355 mL), one 5 oz glass of wine (148 mL), or one 1 oz glass of hard liquor (44 mL). General information Avoid eating more than 2,300 mg of salt a day. If you have hypertension, you may need to reduce your sodium intake to 1,500 mg a day. Work with your provider to stay at a healthy body weight or lose weight. Ask what the best weight range is for you. On most days of the week, get at least 30 minutes of exercise that causes your heart to beat faster. This may include walking, swimming, or biking. Work with your provider or dietitian to adjust your eating plan to meet your specific calorie needs. What foods should I eat? Fruits All fresh, dried, or frozen fruit. Canned fruits that are in their natural juice and do not have sugar added to them. Vegetables Fresh or frozen vegetables that are raw, steamed, roasted, or grilled. Low-sodium or reduced-sodium tomato and vegetable juice. Low-sodium or reduced-sodium tomato sauce and tomato paste. Low-sodium or reduced-sodium canned vegetables. Grains Whole-grain or whole-wheat bread. Whole-grain or whole-wheat pasta. Brown rice. Orpah Cobb. Bulgur. Whole-grain and low-sodium cereals. Pita bread. Low-fat, low-sodium crackers. Whole-wheat flour tortillas. Meats and other proteins Skinless chicken or [place]. Ground chicken or [place]. Pork with fat trimmed off. Fish and seafood. Egg whites. Dried beans, peas, or lentils. Unsalted nuts, nut butters, and seeds. Unsalted canned beans. Lean cuts of beef with fat trimmed off. Low-sodium, lean precooked or cured meat, such as sausages or meat loaves. Dairy Low-fat (1%) or fat-free (skim) milk. Reduced-fat, low-fat, or fat-free cheeses. Nonfat, low-sodium ricotta or cottage cheese. Low-fat or nonfat yogurt. Low-fat, low-sodium cheese. Fats and oils Soft margarine  without trans fats. Vegetable oil. Reduced-fat, low-fat, or light mayonnaise and salad dressings (reduced-sodium). Canola, safflower, olive, avocado, soybean, and sunflower oils. Avocado. Seasonings and condiments Herbs. Spices. Seasoning mixes without salt. Other foods Unsalted popcorn and pretzels. Fat-free sweets. The items listed above may not be all the foods and drinks you can have. Talk to a dietitian to learn more. What foods should I avoid? Fruits Canned fruit in a light or heavy syrup. Fried fruit. Fruit in cream or butter sauce. Vegetables Creamed or fried vegetables. Vegetables in a cheese sauce. Regular canned vegetables that are not marked as low-sodium or reduced-sodium. Regular canned tomato sauce and paste that are not marked as low-sodium or reduced-sodium. Regular tomato and vegetable juices that are not marked as low-sodium or reduced-sodium. Rosita Fire. Olives. Grains Baked goods made with fat, such as croissants, muffins, or some breads. Dry pasta or rice meal packs. Meats and other proteins Fatty cuts of meat. Ribs. Fried meat. Tomasa Blase. Bologna, salami, and other precooked or cured meats, such as sausages or meat loaves, that are not lean and low in  sodium. Fat from the back of a pig (fatback). Bratwurst. Salted nuts and seeds. Canned beans with added salt. Canned or smoked fish. Whole eggs or egg yolks. Chicken or [place] with skin. Dairy Whole or 2% milk, cream, and half-and-half. Whole or full-fat cream cheese. Whole-fat or sweetened yogurt. Full-fat cheese. Nondairy creamers. Whipped toppings. Processed cheese and cheese spreads. Fats and oils Butter. Stick margarine. Lard. Shortening. Ghee. Bacon fat. Tropical oils, such as coconut, palm kernel, or palm oil. Seasonings and condiments Onion salt, garlic salt, seasoned salt, table salt, and sea salt. Worcestershire sauce. Tartar sauce. Barbecue sauce. Teriyaki sauce. Soy sauce, including reduced-sodium soy sauce. Steak sauce.  Canned and packaged gravies. Fish sauce. Oyster sauce. Cocktail sauce. Store-bought horseradish. Ketchup. Mustard. Meat flavorings and tenderizers. Bouillon cubes. Hot sauces. Pre-made or packaged marinades. Pre-made or packaged taco seasonings. Relishes. Regular salad dressings. Other foods Salted popcorn and pretzels. The items listed above may not be all the foods and drinks you should avoid. Talk to a dietitian to learn more. Where to find more information National Heart, Lung, and Blood Institute (NHLBI): BuffaloDryCleaner.gl American Heart Association (AHA): heart.org Academy of Nutrition and Dietetics: eatright.org National Kidney Foundation (NKF): kidney.org This information is not intended to replace advice given to you by your health care provider. Make sure you discuss any questions you have with your health care provider. Document Revised: 12/21/2022 Document Reviewed: 12/21/2022 Elsevier Patient Education  2024 [employer].

## 2023-08-29 ENCOUNTER — Encounter: Payer: Self-pay | Admitting: Nurse Practitioner

## 2023-08-29 ENCOUNTER — Encounter: Payer: Self-pay | Admitting: Oncology

## 2023-08-29 ENCOUNTER — Other Ambulatory Visit (HOSPITAL_COMMUNITY): Payer: Self-pay

## 2023-08-29 ENCOUNTER — Ambulatory Visit (INDEPENDENT_AMBULATORY_CARE_PROVIDER_SITE_OTHER): Payer: Medicare HMO | Admitting: Nurse Practitioner

## 2023-08-29 VITALS — BP 100/57 | HR 68 | Temp 97.6°F | Ht 70.0 in | Wt 232.8 lb

## 2023-08-29 DIAGNOSIS — I152 Hypertension secondary to endocrine disorders: Secondary | ICD-10-CM

## 2023-08-29 DIAGNOSIS — E1159 Type 2 diabetes mellitus with other circulatory complications: Secondary | ICD-10-CM | POA: Diagnosis not present

## 2023-08-29 DIAGNOSIS — Z7985 Long-term (current) use of injectable non-insulin antidiabetic drugs: Secondary | ICD-10-CM | POA: Diagnosis not present

## 2023-08-29 DIAGNOSIS — Z23 Encounter for immunization: Secondary | ICD-10-CM

## 2023-08-29 MED ORDER — ACCU-CHEK GUIDE ME W/DEVICE KIT
PACK | 0 refills | Status: AC
  Filled 2023-08-29: qty 1, 30d supply, fill #0

## 2023-08-29 MED ORDER — ATORVASTATIN CALCIUM 80 MG PO TABS
80.0000 mg | ORAL_TABLET | Freq: Every morning | ORAL | 4 refills | Status: DC
  Filled 2023-08-29: qty 90, 90d supply, fill #0
  Filled 2023-11-26: qty 90, 90d supply, fill #1
  Filled 2024-02-25: qty 90, 90d supply, fill #2
  Filled 2024-05-28: qty 90, 90d supply, fill #3

## 2023-08-29 MED ORDER — RAMIPRIL 2.5 MG PO CAPS
2.5000 mg | ORAL_CAPSULE | Freq: Every day | ORAL | 4 refills | Status: DC
  Filled 2023-08-29: qty 90, 90d supply, fill #0
  Filled 2023-11-26: qty 90, 90d supply, fill #1
  Filled 2024-02-25: qty 90, 90d supply, fill #2
  Filled 2024-05-28: qty 90, 90d supply, fill #3

## 2023-08-29 NOTE — Assessment & Plan Note (Signed)
Chronic, stable.  BP remains soft on check today, although dizziness is improving, but still has occasional episodes with position changes.  Will reduce Ramipril to 2.5 MG daily, educated him on this change (maintain for kidney protection), and maintain rest of regimen as is.  Recommend he monitor BP at least a few mornings a week at home and document.  DASH diet at home.  Labs today: up to date.  Return around 10/26/23 for A1c.

## 2023-08-29 NOTE — Progress Notes (Signed)
BP (!) 100/57   Pulse 68   Temp 97.6 F (36.4 C) (Oral)   Ht 5\' 10"  (1.778 m)   Wt 232 lb 12.8 oz (105.6 kg)   SpO2 98%   BMI 33.40 kg/m    Subjective:    Patient ID: Ernest [name], male    DOB: Mar 27, 1951, 72 y.o.   MRN: [id number]  HPI: Ernest [name] is a 72 y.o. male  Chief Complaint  Patient presents with   Hypertension   HYPERTENSION with Chronic Kidney Disease Follow-up today for BP check, last visit reduced Ramipril to 5 MG due to soft BP levels and dizziness with this.  Returns today for check, he reports improved dizziness -- every once and awhile will have episode, but not like it was.    Currently taking Ramipril 5 MG daily, Lasix 20 MG BID, Metoprolol XL 50 MG once daily, and Cardizem 180 MG daily.  Follows with cardiology, last visit 04/11/23. Hypertension status: stable  Satisfied with current treatment? yes Duration of hypertension: chronic BP monitoring frequency:  not checking BP range:  BP medication side effects:  no Medication compliance: good compliance Aspirin: no Recurrent headaches: no Visual changes: no Palpitations: no Dyspnea: no Chest pain: no Lower extremity edema: no Dizzy/lightheaded: improved with medication reduction   Relevant past medical, surgical, family and social history reviewed and updated as indicated. Interim medical history since our last visit reviewed. Allergies and medications reviewed and updated.  Review of Systems  Constitutional:  Negative for activity change, diaphoresis, fatigue and fever.  Respiratory:  Negative for cough, chest tightness, shortness of breath and wheezing.   Cardiovascular:  Negative for chest pain, palpitations and leg swelling.  Gastrointestinal: Negative.   Neurological:  Positive for dizziness (improving). Negative for syncope, speech difficulty, weakness, numbness and headaches.  Psychiatric/Behavioral: Negative.     Per HPI unless specifically indicated above     Objective:     BP (!) 100/57   Pulse 68   Temp 97.6 F (36.4 C) (Oral)   Ht 5\' 10"  (1.778 m)   Wt 232 lb 12.8 oz (105.6 kg)   SpO2 98%   BMI 33.40 kg/m   Wt Readings from Last 3 Encounters:  08/29/23 232 lb 12.8 oz (105.6 kg)  07/26/23 226 lb 6.4 oz (102.7 kg)  04/25/23 232 lb 4.8 oz (105.4 kg)    Physical Exam Vitals and nursing note reviewed.  Constitutional:      General: He is awake. He is not in acute distress.    Appearance: He is well-developed and well-groomed. He is obese. He is not ill-appearing or toxic-appearing.  HENT:     Head: Normocephalic.     Right Ear: Hearing and external ear normal.     Left Ear: Hearing and external ear normal.  Eyes:     General: Lids are normal.     Extraocular Movements: Extraocular movements intact.     Conjunctiva/sclera: Conjunctivae normal.  Neck:     Thyroid: No thyromegaly.     Vascular: No carotid bruit.  Cardiovascular:     Rate and Rhythm: Normal rate and regular rhythm.     Heart sounds: Normal heart sounds.  Pulmonary:     Effort: Pulmonary effort is normal. No accessory muscle usage or respiratory distress.     Breath sounds: Normal breath sounds.  Abdominal:     General: Bowel sounds are normal. There is no distension.     Palpations: Abdomen is soft.  Tenderness: There is no abdominal tenderness.  Musculoskeletal:     Cervical back: Full passive range of motion without pain.     Right lower leg: No edema.     Left lower leg: No edema.  Lymphadenopathy:     Cervical: No cervical adenopathy.  Skin:    General: Skin is warm.     Capillary Refill: Capillary refill takes less than 2 seconds.  Neurological:     Mental Status: He is alert and oriented to person, place, and time.     Deep Tendon Reflexes: Reflexes are normal and symmetric.     Reflex Scores:      Brachioradialis reflexes are 2+ on the right side and 2+ on the left side.      Patellar reflexes are 2+ on the right side and 2+ on the left side. Psychiatric:         Attention and Perception: Attention normal.        Mood and Affect: Mood normal.        Speech: Speech normal.        Behavior: Behavior normal. Behavior is cooperative.        Thought Content: Thought content normal.    Results for orders placed or performed in visit on 07/26/23  Bayer DCA Hb A1c Waived  Result Value Ref Range   HB A1C (BAYER DCA - WAIVED) 6.3 (H) 4.8 - 5.6 %  Comprehensive metabolic panel  Result Value Ref Range   Glucose 120 (H) 70 - 99 mg/dL   BUN 18 8 - 27 mg/dL   Creatinine, Ser 4.09 0.76 - 1.27 mg/dL   eGFR 64 >81 XB/JYN/8.29   BUN/Creatinine Ratio 15 10 - 24   Sodium 144 134 - 144 mmol/L   Potassium 4.2 3.5 - 5.2 mmol/L   Chloride 103 96 - 106 mmol/L   CO2 26 20 - 29 mmol/L   Calcium 10.3 (H) 8.6 - 10.2 mg/dL   Total Protein 6.5 6.0 - 8.5 g/dL   Albumin 4.5 3.8 - 4.8 g/dL   Globulin, Total 2.0 1.5 - 4.5 g/dL   Bilirubin Total 1.3 (H) 0.0 - 1.2 mg/dL   Alkaline Phosphatase 79 44 - 121 IU/L   AST 13 0 - 40 IU/L   ALT 10 0 - 44 IU/L  Lipid Panel w/o Chol/HDL Ratio  Result Value Ref Range   Cholesterol, Total 68 (L) 100 - 199 mg/dL   Triglycerides 562 0 - 149 mg/dL   HDL 31 (L) >13 mg/dL   VLDL Cholesterol Cal 21 5 - 40 mg/dL   LDL Chol Calc (NIH) 16 0 - 99 mg/dL      Assessment & Plan:   Problem List Items Addressed This Visit       Cardiovascular and Mediastinum   Hypertension associated with diabetes (HCC) - Primary    Chronic, stable.  BP remains soft on check today, although dizziness is improving, but still has occasional episodes with position changes.  Will reduce Ramipril to 2.5 MG daily, educated him on this change (maintain for kidney protection), and maintain rest of regimen as is.  Recommend he monitor BP at least a few mornings a week at home and document.  DASH diet at home.  Labs today: up to date.  Return around 10/26/23 for A1c.       Relevant Medications   atorvastatin (LIPITOR) 80 MG tablet   ramipril (ALTACE) 2.5 MG  capsule   Other Visit Diagnoses     Flu  vaccine need       Flu vaccine provided today, educated on this.   Relevant Orders   Flu Vaccine Trivalent High Dose (Fluad) (Completed)        Follow up plan: Return in about 2 months (around 10/29/2023) for T2DM, HTN/HLD, A-FIB, CKD, COPD, RLS.

## 2023-08-31 ENCOUNTER — Other Ambulatory Visit (HOSPITAL_COMMUNITY): Payer: Self-pay

## 2023-08-31 ENCOUNTER — Ambulatory Visit: Payer: Self-pay | Admitting: *Deleted

## 2023-08-31 NOTE — Patient Outreach (Signed)
Care Coordination   Follow Up Visit Note   08/31/2023 Name: Ernest [name] MRN: [id number] DOB: 06/05/1951  Ernest [name] is a 72 y.o. year old male who sees Aura Dials T, NP for primary care. I spoke with  [name] by phone today.  What matters to the patients health and wellness today?  Keep blood pressure and blood sugars regulated    Goals Addressed             This Visit's Progress    Effective management of DM   On track    Care Coordination Interventions: Provided education to patient about basic DM disease process Reviewed medications with patient and discussed importance of medication adherence Discussed plans with patient for ongoing care management follow up and provided patient with direct contact information for care management team Advised patient, providing education and rationale, to check cbg daily and record, calling PCP for findings outside established parameters        SDOH assessments and interventions completed:  No     Care Coordination Interventions:  Yes, provided   Interventions Today    Flowsheet Row Most Recent Value  Chronic Disease   Chronic disease during today's visit Diabetes, Hypertension (HTN)  [now having issues with hypotension, asymptomatic]  General Interventions   General Interventions Discussed/Reviewed General Interventions Reviewed, Doctor Visits, Communication with, Durable Medical Equipment (DME)  Doctor Visits Discussed/Reviewed Doctor Visits Reviewed, PCP  [next PCP 11/12]  Durable Medical Equipment (DME) BP Cuff  [reminded to monitor blood pressure]  PCP/Specialist Visits Compliance with follow-up visit  [last seen PCP 9/11]  Communication with Pharmacists  [call placed to pharmacy to follow up on new prescriptions for Ramipril, Atorvastatin, and Vitamin D]  Education Interventions   Education Provided Provided Education  Provided Verbal Education On Medication, When to see the doctor, Blood  Sugar Monitoring  [Ramipril decreased from 5mg  to 2.5 mg]       Follow up plan: Follow up call scheduled for 10/18    Encounter Outcome:  Patient Visit Completed   Kemper Durie, RN, MSN, [facility] Care Management Care Management Coordinator [phone]

## 2023-09-07 ENCOUNTER — Telehealth: Payer: Self-pay

## 2023-09-07 NOTE — Telephone Encounter (Signed)
5 boxes of Ozempic received for the patient. Called and notified patient that medication was ready to be picked up.

## 2023-09-07 NOTE — Telephone Encounter (Signed)
Medication picked up by the patient.

## 2023-09-13 ENCOUNTER — Other Ambulatory Visit (HOSPITAL_COMMUNITY): Payer: Self-pay

## 2023-09-19 ENCOUNTER — Other Ambulatory Visit: Payer: Self-pay | Admitting: Nurse Practitioner

## 2023-09-19 MED ORDER — DAPAGLIFLOZIN PROPANEDIOL 10 MG PO TABS: 10.0000 mg | ORAL_TABLET | Freq: Every day | ORAL | 4 refills | Status: AC

## 2023-09-25 ENCOUNTER — Other Ambulatory Visit (HOSPITAL_COMMUNITY): Payer: Self-pay

## 2023-10-05 ENCOUNTER — Ambulatory Visit: Payer: Self-pay | Admitting: *Deleted

## 2023-10-05 NOTE — Patient Outreach (Signed)
Care Coordination   Follow Up Visit Note   10/05/2023 Name: Ernest [name] MRN: [id number] DOB: [birth year]  [name] is a 72 y.o. year old male who sees Aura Dials T, NP for primary care. I spoke with  [name] by phone today.  What matters to the patients health and wellness today?  Remaining stable, with chronic conditions well managed.  Denies any urgent concerns, encouraged to contact this care manager with questions.     Goals Addressed             This Visit's Progress    COMPLETED: Effective management of DM   On track    Care Coordination Interventions: Provided education to patient about basic DM disease process Reviewed medications with patient and discussed importance of medication adherence Discussed plans with patient for ongoing care management follow up and provided patient with direct contact information for care management team Advised patient, providing education and rationale, to check cbg daily and record, calling PCP for findings outside established parameters        SDOH assessments and interventions completed:  No     Care Coordination Interventions:  Yes, provided   Interventions Today    Flowsheet Row Most Recent Value  Chronic Disease   Chronic disease during today's visit Diabetes, Hypertension (HTN)  General Interventions   General Interventions Discussed/Reviewed General Interventions Reviewed, Doctor Visits  Doctor Visits Discussed/Reviewed Doctor Visits Reviewed, PCP  [Upcoming 11/12]  PCP/Specialist Visits Compliance with follow-up visit  Education Interventions   Education Provided Provided Education  Provided Verbal Education On Medication, When to see the doctor, Blood Sugar Monitoring, Labs  [Taking decreased dose of Ramipril, report blood pressure is back to normal.  Also taking Ozempic and Farxiga for DM management]  Labs Reviewed Hgb A1c  [A1C at goal, currently 6.3]       Follow up plan: No  further intervention required.   Encounter Outcome:  Patient Visit Completed   Kemper Durie RN, MSN, CCM [place]  [facility], [facility] RN Care Coordinator Direct Dial: [phone] / Main [phone] Fax [phone] Email: [email or web address] Website: [email or web address]

## 2023-10-09 ENCOUNTER — Other Ambulatory Visit (HOSPITAL_COMMUNITY): Payer: Self-pay

## 2023-10-28 NOTE — Patient Instructions (Incomplete)

## 2023-10-30 ENCOUNTER — Encounter: Payer: Self-pay | Admitting: Nurse Practitioner

## 2023-10-30 ENCOUNTER — Other Ambulatory Visit (HOSPITAL_COMMUNITY): Payer: Self-pay

## 2023-10-30 ENCOUNTER — Ambulatory Visit (INDEPENDENT_AMBULATORY_CARE_PROVIDER_SITE_OTHER): Payer: Medicare HMO | Admitting: Nurse Practitioner

## 2023-10-30 DIAGNOSIS — E1169 Type 2 diabetes mellitus with other specified complication: Secondary | ICD-10-CM | POA: Diagnosis not present

## 2023-10-30 DIAGNOSIS — I152 Hypertension secondary to endocrine disorders: Secondary | ICD-10-CM | POA: Diagnosis not present

## 2023-10-30 DIAGNOSIS — I4811 Longstanding persistent atrial fibrillation: Secondary | ICD-10-CM | POA: Diagnosis not present

## 2023-10-30 DIAGNOSIS — N1831 Chronic kidney disease, stage 3a: Secondary | ICD-10-CM | POA: Diagnosis not present

## 2023-10-30 DIAGNOSIS — I25111 Atherosclerotic heart disease of native coronary artery with angina pectoris with documented spasm: Secondary | ICD-10-CM | POA: Diagnosis not present

## 2023-10-30 DIAGNOSIS — E559 Vitamin D deficiency, unspecified: Secondary | ICD-10-CM

## 2023-10-30 DIAGNOSIS — E1159 Type 2 diabetes mellitus with other circulatory complications: Secondary | ICD-10-CM

## 2023-10-30 DIAGNOSIS — E785 Hyperlipidemia, unspecified: Secondary | ICD-10-CM

## 2023-10-30 DIAGNOSIS — E538 Deficiency of other specified B group vitamins: Secondary | ICD-10-CM

## 2023-10-30 DIAGNOSIS — E6609 Other obesity due to excess calories: Secondary | ICD-10-CM

## 2023-10-30 DIAGNOSIS — J439 Emphysema, unspecified: Secondary | ICD-10-CM | POA: Diagnosis not present

## 2023-10-30 DIAGNOSIS — E66811 Obesity, class 1: Secondary | ICD-10-CM

## 2023-10-30 DIAGNOSIS — G4733 Obstructive sleep apnea (adult) (pediatric): Secondary | ICD-10-CM

## 2023-10-30 LAB — BAYER DCA HB A1C WAIVED: HB A1C (BAYER DCA - WAIVED): 6.2 % — ABNORMAL HIGH (ref 4.8–5.6)

## 2023-10-30 NOTE — Assessment & Plan Note (Signed)
BMI 33.17 with underlying T2DM, HTN/HLD.  Recommended eating smaller high protein, low fat meals more frequently and exercising 30 mins a day 5 times a week with a goal of 10-15lb weight loss in the next 3 months. Patient voiced their understanding and motivation to adhere to these recommendations.

## 2023-10-30 NOTE — Assessment & Plan Note (Signed)
Chronic, ongoing.  Continue collaboration with cardiology, recent note reviewed.  Minimal use of NTG.  Recommend he schedule follow-up with cardiology.

## 2023-10-30 NOTE — Progress Notes (Signed)
BP (!) 123/58   Pulse 80   Temp 98.2 F (36.8 C) (Oral)   Ht 5\' 10"  (1.778 m)   Wt 231 lb 3.2 oz (104.9 kg)   SpO2 97%   BMI 33.17 kg/m    Subjective:    Patient ID: Ernest [name], male    DOB: [birth year], 72 y.o.   MRN: [id number]  HPI: Ernest [name] is a 72 y.o. male  Chief Complaint  Patient presents with   Atrial Fibrillation   Chronic Kidney Disease   COPD   Diabetes    Patient states he has not had a recent eye exam    Hyperlipidemia   Hypertension   RLS   DIABETES A1c 6.3% August.  Continues on Metformin XR 500 MG BID, Ozempic 0.5 MG weekly (does not like Trulicity), and Farxiga 10 MG daily.  Higher doses of Metformin cause nausea in past.  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 Takes Gabapentin 1200 MG for RLS at night taken, without it can not sleep.  Has lost 5 lbs since August. Hypoglycemic episodes:no Polydipsia/polyuria: no Visual disturbance: no Chest pain: no Paresthesias: no Glucose Monitoring: yes  Accucheck frequency: daily  Fasting glucose: 149 this morning  Post prandial:  Evening:  Before meals: Taking Insulin?: no  Long acting insulin:  Short acting insulin: Blood Pressure Monitoring: weekly Retinal Examination: Not Up to Date -- [employer] Exam: Up to Date Pneumovax: Up to Date Influenza: Up to Date Aspirin: no   HYPERTENSION / HYPERLIPIDEMIA Continues on Imdur, Lasix, Cardizem, Zetia, Atorvastatin, Ramipril, Metoprolol.  Last cardiology visit was 04/11/23, they referred him to pulmonary due to ongoing SOB and reassuring cardiac work-up. No visit with pulmonary as of yet. Has CPAP at home but does not use.  Wakes up with it on floor all the time, even strapping to his head it would be off face. Satisfied with current treatment? yes Duration of hypertension: chronic BP monitoring frequency: occasionally BP range: <120/80 range  BP medication side effects: no Duration of  hyperlipidemia: chronic Cholesterol medication side effects: no Cholesterol supplements: none Medication compliance: good compliance Aspirin: no Recent stressors: no Recurrent headaches: no Visual changes: no Palpitations: no Dyspnea: occasional with exertion Chest pain: none recent, no recent NTG use Lower extremity edema: no Dizzy/lightheaded: no  ATRIAL FIBRILLATION Continues on Xarelto and Metoprolol. Atrial fibrillation status: stable Satisfied with current treatment: yes  Medication side effects:  no Medication compliance: good compliance Etiology of atrial fibrillation:  Palpitations:   occasional Chest pain:  no Dyspnea on exertion:  no Orthopnea:  no Syncope:  no Edema:  no Ventricular rate control: B-blocker Anti-coagulation: long acting   CHRONIC KIDNEY DISEASE (Stage 3a) Recent labs have been stable. CKD status: stable Medications renally dose: yes Previous renal evaluation: no Pneumovax:  Up to Date Influenza Vaccine:  Up to Date   COPD Uses Breztri and Albuterol, he is trying to take Breztri twice a day. Smoked for 30 years ago, smoked about 4 PPD, got hypnotized and quit. COPD status: stable Satisfied with current treatment?: yes Oxygen use: no Dyspnea frequency: no Cough frequency: no Rescue inhaler frequency:   Limitation of activity: no Productive cough: no Last Spirometry: 07/04/22 --- FEV1/FVC >80% and FEV1 similar Pneumovax: Up to Date Influenza: Up to Date   Relevant past medical, surgical, family and social history reviewed and updated as indicated. Interim medical history since our last visit reviewed. Allergies and medications reviewed and updated.  Review of Systems  Constitutional:  Negative for activity change, diaphoresis, fatigue and fever.  Respiratory:  Negative for cough, chest tightness, shortness of breath and wheezing.   Cardiovascular:  Negative for chest pain, palpitations and leg swelling.  Gastrointestinal: Negative.    Endocrine: Negative for cold intolerance, heat intolerance, polydipsia, polyphagia and polyuria.  Neurological: Negative.   Psychiatric/Behavioral: Negative.     Per HPI unless specifically indicated above     Objective:    BP (!) 123/58   Pulse 80   Temp 98.2 F (36.8 C) (Oral)   Ht 5\' 10"  (1.778 m)   Wt 231 lb 3.2 oz (104.9 kg)   SpO2 97%   BMI 33.17 kg/m   Wt Readings from Last 3 Encounters:  10/30/23 231 lb 3.2 oz (104.9 kg)  08/29/23 232 lb 12.8 oz (105.6 kg)  07/26/23 226 lb 6.4 oz (102.7 kg)    Physical Exam Vitals and nursing note reviewed.  Constitutional:      General: He is awake. He is not in acute distress.    Appearance: He is well-developed and well-groomed. He is obese. He is not ill-appearing or toxic-appearing.  HENT:     Head: Normocephalic.     Right Ear: Hearing and external ear normal.     Left Ear: Hearing and external ear normal.  Eyes:     General: Lids are normal.     Extraocular Movements: Extraocular movements intact.     Conjunctiva/sclera: Conjunctivae normal.  Neck:     Thyroid: No thyromegaly.     Vascular: No carotid bruit.  Cardiovascular:     Rate and Rhythm: Normal rate and regular rhythm.     Heart sounds: Normal heart sounds.  Pulmonary:     Effort: Pulmonary effort is normal. No accessory muscle usage or respiratory distress.     Breath sounds: Normal breath sounds.  Abdominal:     General: Bowel sounds are normal. There is no distension.     Palpations: Abdomen is soft.     Tenderness: There is no abdominal tenderness.  Musculoskeletal:     Cervical back: Full passive range of motion without pain.     Right lower leg: No edema.     Left lower leg: No edema.  Lymphadenopathy:     Cervical: No cervical adenopathy.  Skin:    General: Skin is warm.     Capillary Refill: Capillary refill takes less than 2 seconds.  Neurological:     Mental Status: He is alert and oriented to person, place, and time.     Deep Tendon  Reflexes: Reflexes are normal and symmetric.     Reflex Scores:      Brachioradialis reflexes are 2+ on the right side and 2+ on the left side.      Patellar reflexes are 2+ on the right side and 2+ on the left side. Psychiatric:        Attention and Perception: Attention normal.        Mood and Affect: Mood normal.        Speech: Speech normal.        Behavior: Behavior normal. Behavior is cooperative.        Thought Content: Thought content normal.    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  Continue collaboration with cardiology, recent note reviewed.  Minimal use of NTG.  Recommend he schedule follow-up with cardiology.      Relevant Orders   Comprehensive metabolic panel   Lipid Panel w/o Chol/HDL Ratio   Atrial fibrillation (HCC)    Chronic, ongoing.  Rate controlled.  Continue collaboration with cardiology and current medication regimen as ordered by them.  Recent note reviewed. Recommend he schedule follow-up with cardiology.      Relevant Orders   Comprehensive metabolic panel   Lipid Panel w/o Chol/HDL Ratio   Hypertension associated with diabetes (HCC)    Chronic, stable.  BP remains well below goal and it is less soft then present checks.  Will continue Ramipril 2.5 MG daily, educated him on this (maintain for kidney protection), and maintain rest of regimen as is.  Recommend he monitor BP at least a few mornings a week at home and document.  DASH diet at home.  Labs today: CMP.  Return in 3 months.       Relevant Orders   Bayer DCA Hb A1c Waived (Completed)   Comprehensive metabolic panel     Respiratory   COPD  (chronic obstructive pulmonary disease) with emphysema (HCC)    Chronic, ongoing.  History of smoking.  Levels >80%.  Continue current inhaler regimen and adjust as needed.  Recommend he consistently use his Breztri and not miss doses as may benefit his SOB, educated him on this.  Recommend he reschedule with pulmonary for further testing due to SOB.        Endocrine   Hyperlipidemia associated with type 2 diabetes mellitus (HCC)    Chronic, ongoing.  Continue current medication regimen and adjust as needed.  Lipid panel today.         Relevant Orders   Bayer DCA Hb A1c Waived (Completed)   Comprehensive metabolic panel   Lipid Panel w/o Chol/HDL Ratio   Type 2 diabetes mellitus with morbid obesity (HCC) - Primary    Chronic, ongoing.  A1c today 6.2%, major trend down with GLP1 on board and has had weight loss of 16 pounds total. Check urine ALB 30 February 2024, continue Ramipril for kidney protection.  Check BS 2-3 times a day, he is noticing a down trend in sugar levels.  Recommend heavy focus on diabetic diet and maintain current regimen at this time.  Return in 3 months. Praised for this success.  Continue to work with CCM team, new referral for pharmacist placed. - ACE and statin on board - Foot exam up to date, recommend he schedule eye exam - Vaccines up to date      Relevant Orders   Bayer DCA Hb A1c Waived (Completed)     Genitourinary   Stage 3a chronic kidney disease (CKD) (HCC)    Chronic, ongoing - recent labs normal range.  At this time continue Ramipril for kidney protection.  CMP today.        Relevant Orders   Comprehensive metabolic panel   Magnesium     Other   B12 deficiency    Chronic.  Noted on past labs, check today and continue daily supplement at home.      Relevant Orders   Vitamin B12   Obesity    BMI 33.17 with  underlying T2DM, HTN/HLD.  Recommended eating smaller high protein, low fat meals more frequently and exercising 30 mins a day 5 times a  week with a goal of 10-15lb weight loss in the next 3 months. Patient voiced their understanding and motivation to adhere to these recommendations.       Vitamin D deficiency    On past labs, recheck today and continue supplement at home.      Relevant Orders   VITAMIN D 25 Hydroxy (Vit-D Deficiency, Fractures)     Follow up plan: Return in 3 months (on 01/30/2024) for T2DM, HTN/HLD, GERD.

## 2023-10-30 NOTE — Assessment & Plan Note (Signed)
Chronic, ongoing.  Continue current medication regimen and adjust as needed. Lipid panel today. 

## 2023-10-30 NOTE — Assessment & Plan Note (Signed)
Chronic.  Noted on past labs, check today and continue daily supplement at home.

## 2023-10-30 NOTE — Assessment & Plan Note (Signed)
Chronic, ongoing.  Rate controlled.  Continue collaboration with cardiology and current medication regimen as ordered by them.  Recent note reviewed. Recommend he schedule follow-up with cardiology.

## 2023-10-30 NOTE — Assessment & Plan Note (Signed)
Chronic, ongoing.  History of smoking.  Levels >80%.  Continue current inhaler regimen and adjust as needed.  Recommend he consistently use his Breztri and not miss doses as may benefit his SOB, educated him on this.  Recommend he reschedule with pulmonary for further testing due to SOB.

## 2023-10-30 NOTE — Assessment & Plan Note (Signed)
Chronic, ongoing.  A1c today 6.2%, major trend down with GLP1 on board and has had weight loss of 16 pounds total. Check urine ALB 30 February 2024, continue Ramipril for kidney protection.  Check BS 2-3 times a day, he is noticing a down trend in sugar levels.  Recommend heavy focus on diabetic diet and maintain current regimen at this time.  Return in 3 months. Praised for this success.  Continue to work with CCM team, new referral for pharmacist placed. - ACE and statin on board - Foot exam up to date, recommend he schedule eye exam - Vaccines up to date

## 2023-10-30 NOTE — Assessment & Plan Note (Signed)
Chronic, stable.  BP remains well below goal and it is less soft then present checks.  Will continue Ramipril 2.5 MG daily, educated him on this (maintain for kidney protection), and maintain rest of regimen as is.  Recommend he monitor BP at least a few mornings a week at home and document.  DASH diet at home.  Labs today: CMP.  Return in 3 months.

## 2023-10-30 NOTE — Assessment & Plan Note (Signed)
On past labs, recheck today and continue supplement at home.

## 2023-10-30 NOTE — Assessment & Plan Note (Signed)
Chronic, ongoing - recent labs normal range.  At this time continue Ramipril for kidney protection.  CMP today.

## 2023-10-31 ENCOUNTER — Other Ambulatory Visit (HOSPITAL_COMMUNITY): Payer: Self-pay

## 2023-10-31 LAB — LIPID PANEL W/O CHOL/HDL RATIO
Cholesterol, Total: 64 mg/dL — ABNORMAL LOW (ref 100–199)
HDL: 30 mg/dL — ABNORMAL LOW (ref >39)
LDL Chol Calc (NIH): 15 mg/dL (ref 0–99)
Triglycerides: 92 mg/dL (ref 0–149)
VLDL Cholesterol Cal: 19 mg/dL (ref 5–40)

## 2023-10-31 LAB — COMPREHENSIVE METABOLIC PANEL
ALT: 13 [IU]/L (ref 0–44)
AST: 16 [IU]/L (ref 0–40)
Albumin: 4.2 g/dL (ref 3.8–4.8)
Alkaline Phosphatase: 93 [IU]/L (ref 44–121)
BUN/Creatinine Ratio: 13 (ref 10–24)
BUN: 16 mg/dL (ref 8–27)
Bilirubin Total: 1.7 mg/dL — ABNORMAL HIGH (ref 0.0–1.2)
CO2: 24 mmol/L (ref 20–29)
Calcium: 9.3 mg/dL (ref 8.6–10.2)
Chloride: 105 mmol/L (ref 96–106)
Creatinine, Ser: 1.26 mg/dL (ref 0.76–1.27)
Globulin, Total: 2.2 g/dL (ref 1.5–4.5)
Glucose: 100 mg/dL — ABNORMAL HIGH (ref 70–99)
Potassium: 4.2 mmol/L (ref 3.5–5.2)
Sodium: 144 mmol/L (ref 134–144)
Total Protein: 6.4 g/dL (ref 6.0–8.5)
eGFR: 60 mL/min/{1.73_m2} (ref >59)

## 2023-10-31 LAB — VITAMIN D 25 HYDROXY (VIT D DEFICIENCY, FRACTURES): Vit D, 25-Hydroxy: 42.5 ng/mL (ref 30.0–100.0)

## 2023-10-31 LAB — VITAMIN B12: Vitamin B-12: 1303 pg/mL — ABNORMAL HIGH (ref 232–1245)

## 2023-10-31 LAB — MAGNESIUM: Magnesium: 1.7 mg/dL (ref 1.6–2.3)

## 2023-10-31 NOTE — Progress Notes (Signed)
Good afternoon, please let Ernest [name] know his labs have returned and are overall stable.  His B12 level is a little high, I do recommend cutting back on this to every other day.  Any questions?  No other medication changes needed.  Great job!!! Keep being amazing!!  Thank you for allowing me to participate in your care.  I appreciate you. Kindest regards, Syrina Wake

## 2023-11-01 ENCOUNTER — Other Ambulatory Visit (HOSPITAL_COMMUNITY): Payer: Self-pay

## 2023-11-08 ENCOUNTER — Other Ambulatory Visit (HOSPITAL_COMMUNITY): Payer: Self-pay

## 2023-11-26 ENCOUNTER — Other Ambulatory Visit (HOSPITAL_COMMUNITY): Payer: Self-pay

## 2023-11-27 ENCOUNTER — Other Ambulatory Visit (HOSPITAL_COMMUNITY): Payer: Self-pay

## 2023-11-27 ENCOUNTER — Other Ambulatory Visit: Payer: Self-pay

## 2024-01-01 DIAGNOSIS — H5203 Hypermetropia, bilateral: Secondary | ICD-10-CM | POA: Diagnosis not present

## 2024-01-01 DIAGNOSIS — H5213 Myopia, bilateral: Secondary | ICD-10-CM | POA: Diagnosis not present

## 2024-01-01 DIAGNOSIS — H52209 Unspecified astigmatism, unspecified eye: Secondary | ICD-10-CM | POA: Diagnosis not present

## 2024-01-01 DIAGNOSIS — H524 Presbyopia: Secondary | ICD-10-CM | POA: Diagnosis not present

## 2024-01-07 ENCOUNTER — Other Ambulatory Visit (HOSPITAL_COMMUNITY): Payer: Self-pay

## 2024-01-08 ENCOUNTER — Other Ambulatory Visit: Payer: Self-pay

## 2024-01-14 ENCOUNTER — Telehealth: Payer: Self-pay

## 2024-01-14 ENCOUNTER — Other Ambulatory Visit (HOSPITAL_COMMUNITY): Payer: Self-pay

## 2024-01-14 NOTE — Telephone Encounter (Signed)
PAP: Application for Ernest [name] and Ernest [name] has been submitted to [employer] ([employer]), via fax   PLEASE BE ADVISED SUBMITTED ON BEHALF OF PT WITH PT CONSENT OF SIGNING ON HIS BEHALF . PT PAGES WITH CONTIGENT OF APPROVAL FROM COMPANY PER COMPANY REQUEST.

## 2024-01-14 NOTE — Progress Notes (Signed)
 Pharmacy Medication Assistance Program Note    01/14/2024  Patient ID: Ernest [name], male   DOB: [birth year], 73 y.o.   MRN: [id number]     01/14/2024  Outreach Medication One  Manufacturer Medication One Nurse, adult Drugs Bretztri  Dose of Breztri 160-9-4.8 MCG  Dose of Farxiga 10MG   Type of Radiographer, therapeutic Assistance  Date Application Sent to Prescriber 01/14/2024  Name of Prescriber Aura Dials NP  Date Application Received From Patient 01/14/2024  Method Application Sent to Manufacturer Fax     Signature    01/14/2024  Patient ID: [name], male  DOB: 1951-08-05, 73 y.o.  MRN:  [id number]     01/14/2024  Outreach Medication Two  Initial Outreach Date (Medication Two) 01/14/2024  Manufacturer Medication Two Novo Nordisk  [facility] Ozempic  Dose of Ozempic 0.25MG /0.5  Date Application Received From Patient 01/14/2024  Application Items Received From Patient Application  Method Application Sent to [employer]

## 2024-01-14 NOTE — Telephone Encounter (Signed)
PAP: Application for Ozempic has been submitted to [employer], via fax  FAXING PROVIDER PAGES TO PROVIDER OFFICE OF Aura Dials NP FOR PROCESSING.  PLEASE BE ADVISED SUBMITTED ON BEHALF OF PT WITH PT CONSENT OF SIGNING ON HIS BEHALF

## 2024-01-15 DIAGNOSIS — H25813 Combined forms of age-related cataract, bilateral: Secondary | ICD-10-CM | POA: Diagnosis not present

## 2024-01-15 DIAGNOSIS — H527 Unspecified disorder of refraction: Secondary | ICD-10-CM | POA: Diagnosis not present

## 2024-01-16 HISTORY — PX: CATARACT EXTRACTION: SUR2

## 2024-01-23 DIAGNOSIS — H25812 Combined forms of age-related cataract, left eye: Secondary | ICD-10-CM | POA: Diagnosis not present

## 2024-01-27 DIAGNOSIS — E119 Type 2 diabetes mellitus without complications: Secondary | ICD-10-CM | POA: Insufficient documentation

## 2024-01-27 NOTE — Patient Instructions (Signed)

## 2024-01-30 ENCOUNTER — Encounter: Payer: Self-pay | Admitting: Nurse Practitioner

## 2024-01-30 ENCOUNTER — Other Ambulatory Visit: Payer: Self-pay

## 2024-01-30 ENCOUNTER — Ambulatory Visit (INDEPENDENT_AMBULATORY_CARE_PROVIDER_SITE_OTHER): Payer: Medicare HMO | Admitting: Nurse Practitioner

## 2024-01-30 DIAGNOSIS — N1831 Chronic kidney disease, stage 3a: Secondary | ICD-10-CM

## 2024-01-30 DIAGNOSIS — E559 Vitamin D deficiency, unspecified: Secondary | ICD-10-CM

## 2024-01-30 DIAGNOSIS — E119 Type 2 diabetes mellitus without complications: Secondary | ICD-10-CM

## 2024-01-30 DIAGNOSIS — E1159 Type 2 diabetes mellitus with other circulatory complications: Secondary | ICD-10-CM

## 2024-01-30 DIAGNOSIS — D500 Iron deficiency anemia secondary to blood loss (chronic): Secondary | ICD-10-CM

## 2024-01-30 DIAGNOSIS — J439 Emphysema, unspecified: Secondary | ICD-10-CM

## 2024-01-30 DIAGNOSIS — I700 Atherosclerosis of aorta: Secondary | ICD-10-CM

## 2024-01-30 DIAGNOSIS — I4811 Longstanding persistent atrial fibrillation: Secondary | ICD-10-CM | POA: Diagnosis not present

## 2024-01-30 DIAGNOSIS — E785 Hyperlipidemia, unspecified: Secondary | ICD-10-CM | POA: Diagnosis not present

## 2024-01-30 DIAGNOSIS — E6609 Other obesity due to excess calories: Secondary | ICD-10-CM

## 2024-01-30 DIAGNOSIS — I152 Hypertension secondary to endocrine disorders: Secondary | ICD-10-CM | POA: Diagnosis not present

## 2024-01-30 DIAGNOSIS — E1169 Type 2 diabetes mellitus with other specified complication: Secondary | ICD-10-CM | POA: Diagnosis not present

## 2024-01-30 DIAGNOSIS — I25111 Atherosclerotic heart disease of native coronary artery with angina pectoris with documented spasm: Secondary | ICD-10-CM

## 2024-01-30 DIAGNOSIS — E66811 Obesity, class 1: Secondary | ICD-10-CM | POA: Diagnosis not present

## 2024-01-30 LAB — MICROALBUMIN, URINE WAIVED
Creatinine, Urine Waived: 300 mg/dL (ref 10–300)
Microalb, Ur Waived: 80 mg/L — ABNORMAL HIGH (ref 0–19)

## 2024-01-30 LAB — BAYER DCA HB A1C WAIVED: HB A1C (BAYER DCA - WAIVED): 5.9 % — ABNORMAL HIGH (ref 4.8–5.6)

## 2024-01-30 MED ORDER — BREZTRI AEROSPHERE 160-9-4.8 MCG/ACT IN AERO: 2.0000 | INHALATION_SPRAY | Freq: Two times a day (BID) | RESPIRATORY_TRACT | 3 refills | Status: AC

## 2024-01-30 NOTE — Assessment & Plan Note (Signed)
Chronic, ongoing. Continue statin therapy and Xarelto as ordered for prevention.

## 2024-01-30 NOTE — Assessment & Plan Note (Signed)
On past labs, recheck today and continue supplement at home.

## 2024-01-30 NOTE — Assessment & Plan Note (Signed)
Chronic, ongoing. Rate controlled. Continue current medication regimen as ordered by cardiology.

## 2024-01-30 NOTE — Assessment & Plan Note (Signed)
Chronic, ongoing. Continue collaboration with cardiology.

## 2024-01-30 NOTE — Telephone Encounter (Signed)
FAXING PROVIDER PAGES FOR OZEMPIC  TO PROVIDER OFFICE OF Aura Dials NP FOR REVIEW AND PROCESSING.    PLEASE BE ADVISED

## 2024-01-30 NOTE — Assessment & Plan Note (Signed)
Chronic, stable. Continue current supplements. Follow-up in 3 months.

## 2024-01-30 NOTE — Assessment & Plan Note (Signed)
Chronic, ongoing. Continue current medication regimen and adjust as needed. Follow-up in 3 months.

## 2024-01-30 NOTE — Assessment & Plan Note (Signed)
Chronic, ongoing. Continue current medication regimen. Follow-up in 3 months.

## 2024-01-30 NOTE — Telephone Encounter (Signed)
PAP: Patient assistance application for Markus Daft has been approved by PAP Companies: [employer] from 01/15/2024 to 12/17/2024. Medication should be delivered to PAP Delivery: Home. For further shipping updates, please contact [employer] ([employer]) at [phone]. Patient ID is: [id number] PLEASE BE ADVISE PT NEED A REFILL SCRIPT SENT TO [facility](IS ON PREFERRED PHARMACIES LIST) PT IS OUT OF REFILL FOR BREZTRI  PAP: Patient assistance application for Marcelline Deist has been approved by PAP Companies: [employer] from 01/15/2024 to 12/17/2024. Medication should be delivered to PAP Delivery: Home. For further shipping updates, please contact [employer] at [phone]. Patient ID is: [id number]  PLEASE BE ADVISE SHIPMENT WAS SENT FOR FARXIGA ON 01/25/2024.

## 2024-01-30 NOTE — Assessment & Plan Note (Signed)
Chronic, ongoing. A1c today 5.9%, continues to trend down with GLP1 on board. Recommend checking BS 2-3 times weekly. Has focused on diet and removing excess sugar from diet. Continue current medication regimen. Follow-up in 3 months. Praised for this continued success.

## 2024-01-30 NOTE — Assessment & Plan Note (Signed)
Continue current statin therapy

## 2024-01-30 NOTE — Assessment & Plan Note (Signed)
BMI 33.43 with underlying T2DM, HTN/HLD. Continue to recommend eating smaller high protein, low fat meals more frequently. When weather improves he is more active outdoors. Has also cut sugar from current diet plan. Follow-up in 3 months.

## 2024-01-30 NOTE — Assessment & Plan Note (Signed)
Chronic, ongoing. BP stable. Continue current medication regimen. Recommend monitor BP at home a few times weekly in the mornings and document. Return in 3 months.

## 2024-01-30 NOTE — Progress Notes (Deleted)
There were no vitals taken for this visit.   Subjective:    Patient ID: Ernest [name], male    DOB: Mar 27, 1951, 73 y.o.   MRN: [id number]  HPI: Ernest [name] is a 73 y.o. male  Chief Complaint  Patient presents with   Hyperlipidemia   Hypertension   Diabetes   Anemia   DIABETES In November A1c 6.3%.  Takes Metformin XR 500 MG BID, Ozempic 0.5 MG weekly, and Farxiga 10 MG daily. Takes Gabapentin 1200 MG for RLS at night taken, without it can not sleep.    Higher doses of Metformin cause nausea in past.  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  Hypoglycemic episodes:{Blank single:19197::"yes","no"} Polydipsia/polyuria: {Blank single:19197::"yes","no"} Visual disturbance: {Blank single:19197::"yes","no"} Chest pain: {Blank single:19197::"yes","no"} Paresthesias: {Blank single:19197::"yes","no"} Glucose Monitoring: {Blank single:19197::"yes","no"}  Accucheck frequency: {Blank single:19197::"Not Checking","Daily","BID","TID"}  Fasting glucose:  Post prandial:  Evening:  Before meals: Taking Insulin?: {Blank single:19197::"yes","no"}  Long acting insulin:  Short acting insulin: Blood Pressure Monitoring: {Blank single:19197::"not checking","rarely","daily","weekly","monthly","a few times a day","a few times a week","a few times a month"} Retinal Examination: {Blank single:19197::"Up to Date","Not up to Date"} Foot Exam: {Blank single:19197::"Up to Date","Not up to Date"} Diabetic Education: {Blank single:19197::"Completed","Not Completed"} Pneumovax: {Blank single:19197::"Up to Date","Not up to Date","unknown"} Influenza: {Blank single:19197::"Up to Date","Not up to Date","unknown"} Aspirin: {Blank single:19197::"yes","no"}   HYPERTENSION / HYPERLIPIDEMIA Taking Imdur, Lasix, Cardizem, Zetia, Atorvastatin, Ramipril, Metoprolol.  Last visit with cardiology was on 04/11/23.  They referred him to pulmonary due to ongoing SOB and  reassuring cardiac work-up, but he has not attended visit with them.   CPAP at home but does not use., always wakes up with it on the floor. Satisfied with current treatment? yes Satisfied with current treatment? {Blank single:19197::"yes","no"} Duration of hypertension: {Blank single:19197::"chronic","months","years"} BP monitoring frequency: {Blank single:19197::"not checking","rarely","daily","weekly","monthly","a few times a day","a few times a week","a few times a month"} BP range:  BP medication side effects: {Blank single:19197::"yes","no"} Duration of hyperlipidemia: {Blank single:19197::"chronic","months","years"} Cholesterol medication side effects: {Blank single:19197::"yes","no"} Cholesterol supplements: {Blank multiple:19196::"none","fish oil","niacin","red yeast rice"} Medication compliance: {Blank single:19197::"excellent compliance","good compliance","fair compliance","poor compliance"} Aspirin: {Blank single:19197::"yes","no"} Recent stressors: {Blank single:19197::"yes","no"} Recurrent headaches: {Blank single:19197::"yes","no"} Visual changes: {Blank single:19197::"yes","no"} Palpitations: {Blank single:19197::"yes","no"} Dyspnea: {Blank single:19197::"yes","no"} Chest pain: {Blank single:19197::"yes","no"} Lower extremity edema: {Blank single:19197::"yes","no"} Dizzy/lightheaded: {Blank single:19197::"yes","no"}   ATRIAL FIBRILLATION Takes Xarelto and Metoprolol. Atrial fibrillation status: {Blank single:19197::"controlled","uncontrolled","better","worse","exacerbated","stable"} Satisfied with current treatment: {Blank single:19197::"yes","no"}  Medication side effects:  {Blank single:19197::"yes","no"} Medication compliance: {Blank single:19197::"excellent compliance","good compliance","fair compliance","poor compliance"} Etiology of atrial fibrillation:  Palpitations:  {Blank single:19197::"yes","no"} Chest pain:  {Blank single:19197::"yes","no"} Dyspnea on  exertion:  {Blank single:19197::"yes","no"} Orthopnea:  {Blank single:19197::"yes","no"} Syncope:  {Blank single:19197::"yes","no"} Edema:  {Blank single:19197::"yes","no"} Ventricular rate control: {Blank single:19197::"Not indicated","diltiazem","verapamil","B-blocker"} Anti-coagulation: {Blank single:19197::"not indicated","aspirin","warfarin","long acting"}   COPD Breztri and Albuterol regimen Smoked for 30 years ago, smoked about 4 PPD. Was hypnotized and quit. COPD status: {Blank single:19197::"controlled","uncontrolled","better","worse","exacerbated","stable"} Satisfied with current treatment?: {Blank single:19197::"yes","no"} Oxygen use: {Blank single:19197::"yes","no"} Dyspnea frequency:  Cough frequency:  Rescue inhaler frequency:   Limitation of activity: {Blank single:19197::"yes","no"} Productive cough:  Last Spirometry:  Pneumovax: {Blank single:19197::"Up to Date","Not up to Date","unknown"} Influenza: {Blank single:19197::"Up to Date","Not up to Date","unknown"}   CHRONIC KIDNEY DISEASE (Stage 3a) & ANEMIA Compliance with treatment: {Blank single:19197::"excellent compliance","good compliance","fair compliance","poor compliance"} Iron supplementation side effects: {Blank single:19197::"yes","no"} Severity of anemia: {Blank single:19197::"mild","moderate","severe"} Fatigue: {Blank single:19197::"yes","no"} Decreased exercise tolerance: {Blank single:19197::"yes","no"}  Dyspnea on exertion: {Blank single:19197::"yes","no"} Palpitations: {Blank single:19197::"yes","no"} Bleeding: {Blank single:19197::"yes","no"} Pica: {Blank single:19197::"yes","no"}  Relevant past medical, surgical, family and social history reviewed and updated as indicated. Interim medical history since our last visit reviewed. Allergies and medications reviewed and updated.  Review of Systems  Constitutional:  Negative for activity change, diaphoresis, fatigue and fever.  Respiratory:  Negative  for cough, chest tightness, shortness of breath and wheezing.   Cardiovascular:  Negative for chest pain, palpitations and leg swelling.  Gastrointestinal: Negative.   Endocrine: Negative for cold intolerance, heat intolerance, polydipsia, polyphagia and polyuria.  Neurological: Negative.   Psychiatric/Behavioral: Negative.     Per HPI unless specifically indicated above     Objective:    There were no vitals taken for this visit.  Wt Readings from Last 3 Encounters:  10/30/23 231 lb 3.2 oz (104.9 kg)  08/29/23 232 lb 12.8 oz (105.6 kg)  07/26/23 226 lb 6.4 oz (102.7 kg)    Physical Exam Vitals and nursing note reviewed.  Constitutional:      General: He is awake. He is not in acute distress.    Appearance: He is well-developed and well-groomed. He is obese. He is not ill-appearing or toxic-appearing.  HENT:     Head: Normocephalic.     Right Ear: Hearing and external ear normal.     Left Ear: Hearing and external ear normal.  Eyes:     General: Lids are normal.     Extraocular Movements: Extraocular movements intact.     Conjunctiva/sclera: Conjunctivae normal.  Neck:     Thyroid: No thyromegaly.     Vascular: No carotid bruit.  Cardiovascular:     Rate and Rhythm: Normal rate and regular rhythm.     Heart sounds: Normal heart sounds.  Pulmonary:     Effort: Pulmonary effort is normal. No accessory muscle usage or respiratory distress.     Breath sounds: Normal breath sounds.  Abdominal:     General: Bowel sounds are normal. There is no distension.     Palpations: Abdomen is soft.     Tenderness: There is no abdominal tenderness.  Musculoskeletal:     Cervical back: Full passive range of motion without pain.     Right lower leg: No edema.     Left lower leg: No edema.  Lymphadenopathy:     Cervical: No cervical adenopathy.  Skin:    General: Skin is warm.     Capillary Refill: Capillary refill takes less than 2 seconds.  Neurological:     Mental Status: He is  alert and oriented to person, place, and time.     Deep Tendon Reflexes: Reflexes are normal and symmetric.     Reflex Scores:      Brachioradialis reflexes are 2+ on the right side and 2+ on the left side.      Patellar reflexes are 2+ on the right side and 2+ on the left side. Psychiatric:        Attention and Perception: Attention normal.        Mood and Affect: Mood normal.        Speech: Speech normal.        Behavior: Behavior normal. Behavior is cooperative.        Thought Content: Thought content normal.    Results for orders placed or performed in visit on 10/30/23  Bayer DCA Hb A1c Waived   Collection Time: 10/30/23  2:15 PM  Result Value Ref Range   HB A1C (BAYER DCA - WAIVED) 6.2 (H) 4.8 - 5.6 %  Comprehensive metabolic panel  Collection Time: 10/30/23  2:17 PM  Result Value Ref Range   Glucose 100 (H) 70 - 99 mg/dL   BUN 16 8 - 27 mg/dL   Creatinine, Ser 4.09 0.76 - 1.27 mg/dL   eGFR 60 >81 XB/JYN/8.29   BUN/Creatinine Ratio 13 10 - 24   Sodium 144 134 - 144 mmol/L   Potassium 4.2 3.5 - 5.2 mmol/L   Chloride 105 96 - 106 mmol/L   CO2 24 20 - 29 mmol/L   Calcium 9.3 8.6 - 10.2 mg/dL   Total Protein 6.4 6.0 - 8.5 g/dL   Albumin 4.2 3.8 - 4.8 g/dL   Globulin, Total 2.2 1.5 - 4.5 g/dL   Bilirubin Total 1.7 (H) 0.0 - 1.2 mg/dL   Alkaline Phosphatase 93 44 - 121 IU/L   AST 16 0 - 40 IU/L   ALT 13 0 - 44 IU/L  Lipid Panel w/o Chol/HDL Ratio   Collection Time: 10/30/23  2:17 PM  Result Value Ref Range   Cholesterol, Total 64 (L) 100 - 199 mg/dL   Triglycerides 92 0 - 149 mg/dL   HDL 30 (L) >56 mg/dL   VLDL Cholesterol Cal 19 5 - 40 mg/dL   LDL Chol Calc (NIH) 15 0 - 99 mg/dL  VITAMIN D 25 Hydroxy (Vit-D Deficiency, Fractures)   Collection Time: 10/30/23  2:17 PM  Result Value Ref Range   Vit D, 25-Hydroxy 42.5 30.0 - 100.0 ng/mL  Vitamin B12   Collection Time: 10/30/23  2:17 PM  Result Value Ref Range   Vitamin B-12 1,303 (H) 232 - 1,245 pg/mL  Magnesium    Collection Time: 10/30/23  2:17 PM  Result Value Ref Range   Magnesium 1.7 1.6 - 2.3 mg/dL      Assessment & Plan:   Problem List Items Addressed This Visit       Cardiovascular and Mediastinum   Aortic atherosclerosis (HCC)   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)   Atrial fibrillation (HCC)   Calcification of abdominal aorta (HCC)   Hypertension associated with diabetes (HCC)   Relevant Orders   Bayer DCA Hb A1c Waived   Microalbumin, Urine Waived   Comprehensive metabolic panel   TSH     Respiratory   COPD (chronic obstructive pulmonary disease) with emphysema (HCC)     Endocrine   Diabetes mellitus treated with injections of non-insulin medication (HCC)   Hyperlipidemia associated with type 2 diabetes mellitus (HCC)   Relevant Orders   Bayer DCA Hb A1c Waived   Comprehensive metabolic panel   Lipid Panel w/o Chol/HDL Ratio   Type 2 diabetes mellitus with morbid obesity (HCC) - Primary   Relevant Orders   Bayer DCA Hb A1c Waived   Microalbumin, Urine Waived     Genitourinary   Stage 3a chronic kidney disease (CKD) (HCC)     Other   Iron deficiency anemia due to chronic blood loss   Relevant Orders   Ferritin   Iron   Vitamin B12   CBC with Differential/Platelet   Obesity   Vitamin D deficiency   Relevant Orders   VITAMIN D 25 Hydroxy (Vit-D Deficiency, Fractures)     Follow up plan: No follow-ups on file.

## 2024-01-30 NOTE — Assessment & Plan Note (Signed)
Refer to diabetes with obesity plan of care.

## 2024-01-30 NOTE — Progress Notes (Signed)
BP 138/64   Pulse 89   Temp (!) 97.5 F (36.4 C) (Oral)   Ht 5\' 10"  (1.778 m)   Wt 233 lb (105.7 kg)   SpO2 98%   BMI 33.43 kg/m    Subjective:    Patient ID: Ernest [name], male    DOB: 1951/05/27, 73 y.o.   MRN: [id number]  HPI: Ernest [name] is a 73 y.o. male  Chief Complaint  Patient presents with   Hyperlipidemia   Hypertension   Diabetes   Anemia   NOTE WRITTEN BY DNP STUDENT.  ASSESSMENT AND PLAN OF CARE REVIEWED WITH STUDENT, AGREE WITH ABOVE FINDINGS AND PLAN.   DIABETES In November A1c 6.3%.  Takes Metformin XR 500 MG BID, Ozempic 0.5 MG weekly, and Farxiga 10 MG daily. Takes Gabapentin 1200 MG for RLS at night taken, without it can not sleep.    Higher doses of Metformin cause nausea in past.  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  Hypoglycemic episodes:no Polydipsia/polyuria: no Visual disturbance: no Chest pain: no Paresthesias: no Glucose Monitoring:  Once in a while  Accucheck frequency:  Occasionally Taking Insulin?: no Blood Pressure Monitoring:  Occasionally Retinal Examination: Up to Date Foot Exam: Up to Date Diabetic Education: Completed Pneumovax: Up to Date Influenza: Up to Date Aspirin: no   HYPERTENSION / HYPERLIPIDEMIA Taking Imdur, Lasix, Cardizem, Zetia, Atorvastatin, Ramipril, Metoprolol.  Last visit with cardiology was on 04/11/23.  They referred him to pulmonary due to ongoing SOB and reassuring cardiac work-up, but he has not attended visit with them.   CPAP at home but does not use., always wakes up with it on the floor. Satisfied with current treatment? yes Satisfied with current treatment? no Duration of hypertension: years BP monitoring frequency: rarely BP range:  BP medication side effects: no Duration of hyperlipidemia: years Cholesterol medication side effects: no Cholesterol supplements: none Medication compliance: good compliance Aspirin: no Recent stressors:  no Recurrent headaches: no Visual changes: no Palpitations: no Dyspnea: no Chest pain:  When having an episode of A-fib Lower extremity edema: no Dizzy/lightheaded: no   ATRIAL FIBRILLATION Takes Xarelto and Metoprolol. Atrial fibrillation status: stable Satisfied with current treatment: yes  Medication side effects:  no Medication compliance: good compliance Etiology of atrial fibrillation:  Palpitations:  no Chest pain:   When having an episode of A-fib Dyspnea on exertion:   Occasionally Orthopnea:  no Syncope:  no Edema:  no Ventricular rate control: B-blocker Anti-coagulation: long acting   COPD Breztri and Albuterol regimen Smoked for 30 years ago, smoked about 4 PPD. Was hypnotized and quit. COPD status: stable Satisfied with current treatment?: no Oxygen use: no Dyspnea frequency: No Cough frequency: Occasional dry cough Rescue inhaler frequency:  When he can find it Limitation of activity: no Productive cough: No Last Spirometry:  Pneumovax: Up to Date Influenza: Up to Date   CHRONIC KIDNEY DISEASE (Stage 3a) & ANEMIA Compliance with treatment: good compliance Iron supplementation side effects: no Severity of anemia: mild Fatigue: no Decreased exercise tolerance: no  Dyspnea on exertion: no Palpitations: no Bleeding: no Pica: no   Relevant past medical, surgical, family and social history reviewed and updated as indicated. Interim medical history since our last visit reviewed. Allergies and medications reviewed and updated.  Review of Systems  Constitutional:  Negative for activity change, appetite change, fatigue and fever.  Respiratory:  Negative for cough, chest tightness, shortness of breath and wheezing.   Cardiovascular:  Negative  for chest pain, palpitations and leg swelling.  Gastrointestinal:  Positive for constipation. Negative for abdominal pain, diarrhea, nausea and vomiting.  Endocrine: Negative for cold intolerance, heat intolerance,  polydipsia and polyuria.  Genitourinary:  Negative for difficulty urinating and frequency.  Neurological:  Negative for dizziness, light-headedness and headaches.  Psychiatric/Behavioral:  Negative for agitation, behavioral problems and confusion.    Per HPI unless specifically indicated above     Objective:    BP 138/64   Pulse 89   Temp (!) 97.5 F (36.4 C) (Oral)   Ht 5\' 10"  (1.778 m)   Wt 233 lb (105.7 kg)   SpO2 98%   BMI 33.43 kg/m   Wt Readings from Last 3 Encounters:  01/30/24 233 lb (105.7 kg)  10/30/23 231 lb 3.2 oz (104.9 kg)  08/29/23 232 lb 12.8 oz (105.6 kg)    Physical Exam Vitals and nursing note reviewed.  Constitutional:      General: He is not in acute distress.    Appearance: Normal appearance. He is not ill-appearing or toxic-appearing.  HENT:     Right Ear: Tympanic membrane and ear canal normal. There is impacted cerumen.     Left Ear: Tympanic membrane and ear canal normal. There is no impacted cerumen.     Nose: Nose normal. No congestion.     Mouth/Throat:     Mouth: Mucous membranes are moist.     Pharynx: Oropharynx is clear. No oropharyngeal exudate or posterior oropharyngeal erythema.  Neck:     Thyroid: No thyroid mass or thyroid tenderness.     Vascular: No carotid bruit.  Cardiovascular:     Rate and Rhythm: Normal rate and regular rhythm.     Heart sounds: Normal heart sounds. No murmur heard. Pulmonary:     Effort: Pulmonary effort is normal. No respiratory distress.     Breath sounds: Normal breath sounds. No decreased breath sounds or wheezing.  Abdominal:     General: Bowel sounds are normal.     Palpations: Abdomen is soft.     Tenderness: There is no abdominal tenderness.  Musculoskeletal:     Cervical back: Full passive range of motion without pain, normal range of motion and neck supple.     Right lower leg: No edema.     Left lower leg: No edema.  Lymphadenopathy:     Cervical: No cervical adenopathy.     Right  cervical: No superficial cervical adenopathy.    Left cervical: No superficial cervical adenopathy.  Skin:    General: Skin is warm and dry.     Findings: No bruising, rash or wound.  Neurological:     General: No focal deficit present.     Mental Status: He is alert and oriented to person, place, and time.     Deep Tendon Reflexes: Reflexes are normal and symmetric.  Psychiatric:        Attention and Perception: Attention and perception normal.        Mood and Affect: Mood and affect normal.        Speech: Speech normal.        Behavior: Behavior normal. Behavior is cooperative.        Thought Content: Thought content normal.        Cognition and Memory: Cognition normal.        Judgment: Judgment normal.     Results for orders placed or performed in visit on 10/30/23  Bayer DCA Hb A1c Waived   Collection Time:  10/30/23  2:15 PM  Result Value Ref Range   HB A1C (BAYER DCA - WAIVED) 6.2 (H) 4.8 - 5.6 %  Comprehensive metabolic panel   Collection Time: 10/30/23  2:17 PM  Result Value Ref Range   Glucose 100 (H) 70 - 99 mg/dL   BUN 16 8 - 27 mg/dL   Creatinine, Ser 1.61 0.76 - 1.27 mg/dL   eGFR 60 >09 UE/AVW/0.98   BUN/Creatinine Ratio 13 10 - 24   Sodium 144 134 - 144 mmol/L   Potassium 4.2 3.5 - 5.2 mmol/L   Chloride 105 96 - 106 mmol/L   CO2 24 20 - 29 mmol/L   Calcium 9.3 8.6 - 10.2 mg/dL   Total Protein 6.4 6.0 - 8.5 g/dL   Albumin 4.2 3.8 - 4.8 g/dL   Globulin, Total 2.2 1.5 - 4.5 g/dL   Bilirubin Total 1.7 (H) 0.0 - 1.2 mg/dL   Alkaline Phosphatase 93 44 - 121 IU/L   AST 16 0 - 40 IU/L   ALT 13 0 - 44 IU/L  Lipid Panel w/o Chol/HDL Ratio   Collection Time: 10/30/23  2:17 PM  Result Value Ref Range   Cholesterol, Total 64 (L) 100 - 199 mg/dL   Triglycerides 92 0 - 149 mg/dL   HDL 30 (L) >11 mg/dL   VLDL Cholesterol Cal 19 5 - 40 mg/dL   LDL Chol Calc (NIH) 15 0 - 99 mg/dL  VITAMIN D 25 Hydroxy (Vit-D Deficiency, Fractures)   Collection Time: 10/30/23  2:17 PM   Result Value Ref Range   Vit D, 25-Hydroxy 42.5 30.0 - 100.0 ng/mL  Vitamin B12   Collection Time: 10/30/23  2:17 PM  Result Value Ref Range   Vitamin B-12 1,303 (H) 232 - 1,245 pg/mL  Magnesium   Collection Time: 10/30/23  2:17 PM  Result Value Ref Range   Magnesium 1.7 1.6 - 2.3 mg/dL      Assessment & Plan:   Problem List Items Addressed This Visit       Cardiovascular and Mediastinum   Aortic atherosclerosis (HCC)   Chronic, ongoing. Continue statin therapy and Xarelto as ordered for prevention.      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 Continue collaboration with cardiology.      Atrial fibrillation (HCC)   Chronic, ongoing. Rate controlled. Continue current medication regimen as ordered by cardiology.      Calcification of abdominal aorta (HCC)   Continue current statin therapy.      Hypertension associated with diabetes (HCC)   Chronic, ongoing. BP stable. Continue current medication regimen. Recommend monitor BP at home a few times weekly in the mornings and document. Return in 3 months.      Relevant Orders   Bayer DCA Hb A1c Waived   Microalbumin, Urine Waived   Comprehensive metabolic panel   TSH     Respiratory   COPD (chronic obstructive pulmonary disease) with emphysema (HCC)   Chronic, ongoing. History of smoking. Continue current inhaler regimen, adjust as needed. Recommend he not misplace his rescue inhaler. Schedule 3 month follow-up.        Endocrine   Diabetes mellitus treated with injections of non-insulin medication (HCC)   Refer to diabetes with obesity plan of care.      Hyperlipidemia associated with type 2 diabetes mellitus (HCC)   Chronic, ongoing. Continue current medication regimen and adjust as needed. Follow-up in 3 months.  Relevant Orders   Bayer DCA Hb A1c Waived   Comprehensive metabolic panel   Lipid Panel w/o Chol/HDL Ratio    Type 2 diabetes mellitus with morbid obesity (HCC) - Primary   Chronic, ongoing. A1c today 5.9%, continues to trend down with GLP1 on board. Recommend checking BS 2-3 times weekly. Has focused on diet and removing excess sugar from diet. Continue current medication regimen. Follow-up in 3 months. Praised for this continued success.       Relevant Orders   Bayer DCA Hb A1c Waived   Microalbumin, Urine Waived     Genitourinary   Stage 3a chronic kidney disease (CKD) (HCC)   Chronic, ongoing. Continue current medication regimen. Follow-up in 3 months.        Other   Iron deficiency anemia due to chronic blood loss   Chronic, stable. Continue current supplements. Follow-up in 3 months.      Relevant Orders   Ferritin   Iron   Vitamin B12   CBC with Differential/Platelet   Obesity   BMI 33.43 with underlying T2DM, HTN/HLD. Continue to recommend eating smaller high protein, low fat meals more frequently. When weather improves he is more active outdoors. Has also cut sugar from current diet plan. Follow-up in 3 months.      Vitamin D deficiency   On past labs, recheck today and continue supplement at home.      Relevant Orders   VITAMIN D 25 Hydroxy (Vit-D Deficiency, Fractures)     Follow up plan: Return in about 3 months (around 04/28/2024) for T2DM, HTN/HLD, A-FIB.

## 2024-01-30 NOTE — Assessment & Plan Note (Signed)
Chronic, ongoing. History of smoking. Continue current inhaler regimen, adjust as needed. Recommend he not misplace his rescue inhaler. Schedule 3 month follow-up.

## 2024-01-31 ENCOUNTER — Other Ambulatory Visit: Payer: Self-pay | Admitting: Nurse Practitioner

## 2024-01-31 LAB — CBC WITH DIFFERENTIAL/PLATELET
Basophils Absolute: 0 10*3/uL (ref 0.0–0.2)
Basos: 1 %
EOS (ABSOLUTE): 0.1 10*3/uL (ref 0.0–0.4)
Eos: 3 %
Hematocrit: 33.9 % — ABNORMAL LOW (ref 37.5–51.0)
Hemoglobin: 9.3 g/dL — ABNORMAL LOW (ref 13.0–17.7)
Immature Grans (Abs): 0 10*3/uL (ref 0.0–0.1)
Immature Granulocytes: 0 %
Lymphocytes Absolute: 1.6 10*3/uL (ref 0.7–3.1)
Lymphs: 37 %
MCH: 19.6 pg — ABNORMAL LOW (ref 26.6–33.0)
MCHC: 27.4 g/dL — ABNORMAL LOW (ref 31.5–35.7)
MCV: 71 fL — ABNORMAL LOW (ref 79–97)
Monocytes Absolute: 0.3 10*3/uL (ref 0.1–0.9)
Monocytes: 6 %
Neutrophils Absolute: 2.2 10*3/uL (ref 1.4–7.0)
Neutrophils: 53 %
Platelets: 196 10*3/uL (ref 150–450)
RBC: 4.75 x10E6/uL (ref 4.14–5.80)
RDW: 16.2 % — ABNORMAL HIGH (ref 11.6–15.4)
WBC: 4.2 10*3/uL (ref 3.4–10.8)

## 2024-01-31 LAB — VITAMIN D 25 HYDROXY (VIT D DEFICIENCY, FRACTURES): Vit D, 25-Hydroxy: 43.7 ng/mL (ref 30.0–100.0)

## 2024-01-31 LAB — COMPREHENSIVE METABOLIC PANEL
ALT: 14 [IU]/L (ref 0–44)
AST: 15 [IU]/L (ref 0–40)
Albumin: 4.6 g/dL (ref 3.8–4.8)
Alkaline Phosphatase: 88 [IU]/L (ref 44–121)
BUN/Creatinine Ratio: 13 (ref 10–24)
BUN: 16 mg/dL (ref 8–27)
Bilirubin Total: 1.5 mg/dL — ABNORMAL HIGH (ref 0.0–1.2)
CO2: 25 mmol/L (ref 20–29)
Calcium: 9.6 mg/dL (ref 8.6–10.2)
Chloride: 102 mmol/L (ref 96–106)
Creatinine, Ser: 1.19 mg/dL (ref 0.76–1.27)
Globulin, Total: 2 g/dL (ref 1.5–4.5)
Glucose: 110 mg/dL — ABNORMAL HIGH (ref 70–99)
Potassium: 4 mmol/L (ref 3.5–5.2)
Sodium: 142 mmol/L (ref 134–144)
Total Protein: 6.6 g/dL (ref 6.0–8.5)
eGFR: 64 mL/min/{1.73_m2} (ref >59)

## 2024-01-31 LAB — LIPID PANEL W/O CHOL/HDL RATIO
Cholesterol, Total: 67 mg/dL — ABNORMAL LOW (ref 100–199)
HDL: 32 mg/dL — ABNORMAL LOW (ref >39)
LDL Chol Calc (NIH): 18 mg/dL (ref 0–99)
Triglycerides: 84 mg/dL (ref 0–149)
VLDL Cholesterol Cal: 17 mg/dL (ref 5–40)

## 2024-01-31 LAB — TSH: TSH: 4.34 u[IU]/mL (ref 0.450–4.500)

## 2024-01-31 LAB — VITAMIN B12: Vitamin B-12: 978 pg/mL (ref 232–1245)

## 2024-01-31 LAB — FERRITIN: Ferritin: 21 ng/mL — ABNORMAL LOW (ref 30–400)

## 2024-01-31 LAB — IRON: Iron: 25 ug/dL — ABNORMAL LOW (ref 38–169)

## 2024-01-31 MED ORDER — IRON-VITAMIN C 65-125 MG PO TABS
1.0000 | ORAL_TABLET | Freq: Every day | ORAL | 2 refills | Status: DC
  Filled 2024-01-31: qty 60, 60d supply, fill #0

## 2024-01-31 NOTE — Progress Notes (Signed)
Please let Amada Jupiter know his labs have returned and overall are stable with exception of one thing I am concerned about.  Blood count is showing a low hemoglobin and hematocrit which is new for him and iron level is low at 25, would like to see above 40.  Have you had any bleeding recently, either in stool or urine? I would like him to start taking iron supplement I am sending in + return in 4 weeks for office visit and repeat labs, however if any increase shortness of breath, fatigue, or cold intolerance prior to lab check OR if notices any bleeding I want him to come in sooner.  If you continue to show anemia on repeat labs then I would like to get you into either the GI doctor or hematology depending on repeat levels or symptoms.  Any questions?  (Staff please schedule 4 week follow-up visit for anemia) Keep being amazing!!  Thank you for allowing me to participate in your care.  I appreciate you. Kindest regards, Melaina Howerton  Keep being amazing!!  Thank you for allowing me to participate in your care.  I appreciate you. Kindest regards, Aaliah Jorgenson

## 2024-02-01 ENCOUNTER — Other Ambulatory Visit: Payer: Self-pay

## 2024-02-01 ENCOUNTER — Encounter: Payer: Self-pay | Admitting: Oncology

## 2024-02-01 ENCOUNTER — Other Ambulatory Visit (HOSPITAL_COMMUNITY): Payer: Self-pay

## 2024-02-07 DIAGNOSIS — H25811 Combined forms of age-related cataract, right eye: Secondary | ICD-10-CM | POA: Diagnosis not present

## 2024-02-11 ENCOUNTER — Other Ambulatory Visit (HOSPITAL_COMMUNITY): Payer: Self-pay

## 2024-02-11 ENCOUNTER — Other Ambulatory Visit: Payer: Self-pay | Admitting: Nurse Practitioner

## 2024-02-11 NOTE — Telephone Encounter (Signed)
 Resent provider pages to office of Aura Dials NP.    Please be advised

## 2024-02-12 ENCOUNTER — Other Ambulatory Visit (HOSPITAL_COMMUNITY): Payer: Self-pay

## 2024-02-12 ENCOUNTER — Other Ambulatory Visit: Payer: Self-pay

## 2024-02-12 MED ORDER — RIVAROXABAN 20 MG PO TABS
20.0000 mg | ORAL_TABLET | Freq: Every evening | ORAL | 4 refills | Status: DC
  Filled 2024-02-12: qty 30, 30d supply, fill #0
  Filled 2024-04-01: qty 30, 30d supply, fill #1
  Filled 2024-05-06: qty 30, 30d supply, fill #2
  Filled 2024-05-28 – 2024-05-29 (×2): qty 30, 30d supply, fill #3
  Filled 2024-07-09: qty 30, 30d supply, fill #4

## 2024-02-12 NOTE — Telephone Encounter (Signed)
 Requested medications are due for refill today.  yes  Requested medications are on the active medications list.  yes  Last refill. 08/08/2023 #30 4 rf  Future visit scheduled.   yes  Notes to clinic.  Abnormal labs    Requested Prescriptions  Pending Prescriptions Disp Refills   rivaroxaban (XARELTO) 20 MG TABS tablet 30 tablet 4    Sig: Take 1 tablet (20 mg total) by mouth every evening.     Hematology: Anticoagulants - rivaroxaban Failed - 02/12/2024  3:29 PM      Failed - HCT in normal range and within 360 days    Hematocrit  Date Value Ref Range Status  01/30/2024 33.9 (L) 37.5 - 51.0 % Final         Failed - HGB in normal range and within 360 days    Hemoglobin  Date Value Ref Range Status  01/30/2024 9.3 (L) 13.0 - 17.7 g/dL Final         Passed - ALT in normal range and within 360 days    ALT  Date Value Ref Range Status  01/30/2024 14 0 - 44 IU/L Final   ALT (SGPT) Piccolo, Waived  Date Value Ref Range Status  08/01/2017 33 10 - 47 U/L Final         Passed - AST in normal range and within 360 days    AST  Date Value Ref Range Status  01/30/2024 15 0 - 40 IU/L Final   AST (SGOT) Piccolo, Waived  Date Value Ref Range Status  08/01/2017 34 11 - 38 U/L Final         Passed - Cr in normal range and within 360 days    Creatinine  Date Value Ref Range Status  07/25/2014 1.21 0.60 - 1.30 mg/dL Final   Creatinine, Ser  Date Value Ref Range Status  01/30/2024 1.19 0.76 - 1.27 mg/dL Final         Passed - PLT in normal range and within 360 days    Platelets  Date Value Ref Range Status  01/30/2024 196 150 - 450 x10E3/uL Final         Passed - eGFR is 15 or above and within 360 days    EGFR (African American)  Date Value Ref Range Status  07/25/2014 >60  Final   GFR calc Af Amer  Date Value Ref Range Status  11/19/2020 81 >59 mL/min/1.73 Final    Comment:    **In accordance with recommendations from the NKF-ASN Task force,**   [facility] is in the  process of updating its eGFR calculation to the   2021 CKD-EPI creatinine equation that estimates kidney function   without a race variable.    EGFR (Non-African Amer.)  Date Value Ref Range Status  07/25/2014 >60  Final    Comment:    eGFR values <26mL/min/1.73 m2 may be an indication of chronic kidney disease (CKD). Calculated eGFR is useful in patients with stable renal function. The eGFR calculation will not be reliable in acutely ill patients when serum creatinine is changing rapidly. It is not useful in  patients on dialysis. The eGFR calculation may not be applicable to patients at the low and high extremes of body sizes, pregnant women, and vegetarians.    GFR, Estimated  Date Value Ref Range Status  04/04/2021 >60 >60 mL/min Final    Comment:    (NOTE) Calculated using the CKD-EPI Creatinine Equation (2021)    eGFR  Date Value Ref Range  Status  01/30/2024 64 >59 mL/min/1.73 Final         Passed - Patient is not pregnant      Passed - Valid encounter within last 12 months    Recent Outpatient Visits           3 months ago Type 2 diabetes mellitus with morbid obesity (HCC)   [facility] [place], [place] T, NP   5 months ago Hypertension associated with diabetes (HCC)   [place] [facility] [place], [place] T, NP   6 months ago Type 2 diabetes mellitus with morbid obesity (HCC)   [place] [facility] [place], [place] T, NP   9 months ago Type 2 diabetes mellitus with morbid obesity (HCC)   [place] [facility] [place], Corrie Dandy T, NP   11 months ago Unstable angina [facility])   [place] [facility] [place], Dorie Rank, NP       Future Appointments             In 2 weeks [place], Dorie Rank, NP [place] [facility]   In 2 months [place], Dorie Rank, NP [place] [facility], [facility]

## 2024-02-13 ENCOUNTER — Other Ambulatory Visit (HOSPITAL_COMMUNITY): Payer: Self-pay

## 2024-02-18 ENCOUNTER — Telehealth: Payer: Self-pay

## 2024-02-22 NOTE — Telephone Encounter (Signed)
 Following up on pt PAP for [employer] we are still waiting on provider portion per [facility] if not received by next week she will ask Dr to sign when she is in the office next week.

## 2024-02-24 NOTE — Patient Instructions (Signed)
Iron Deficiency Anemia, Adult  Iron deficiency anemia is when you do not have enough red blood cells or hemoglobin in your blood. This happens because you have too little iron in your body. Hemoglobin carries oxygen to parts of the body. Anemia can cause your body to not get enough oxygen. What are the causes? Not eating enough foods that have iron in them. The body not being able to take in iron well. Blood loss. What increases the risk? Having menstrual periods. Being pregnant. What are the signs or symptoms? Pale skin, lips, and nails. Weakness, dizziness, and getting tired easily. Feeling like you cannot breathe well when moving (shortness of breath). Cold hands and feet. Mild anemia may not cause any symptoms. How is this treated? This condition is treated by finding out why you do not have enough iron and then getting more iron. It may include: Adding foods to your diet that have a lot of iron. Taking iron pills (supplements). If you are pregnant or breastfeeding, you may need to take extra iron. Your diet often does not provide the amount of iron that you need. Getting more vitamin C in your diet. Vitamin C helps your body take in iron. You may need to take iron pills with a glass of orange juice or vitamin C pills. Medicines to make heavy menstrual periods lighter. Surgery or testing procedures to find what is causing the condition. You may need blood tests to see if treatment is working. If the treatment does not seem to be working, you may need more tests. Follow these instructions at home: Medicines Take over-the-counter and prescription medicines only as told by your doctor. This includes iron pills and vitamins. Taking them as told is important because too much iron can be harmful. Take iron pills when your stomach is empty. If you cannot handle this, take them with food. Do not drink milk or take antacids at the same time as your iron pills. Iron pills may turn your poop  (stool)black. If you cannot handle taking iron pills by mouth, ask your doctor about getting iron through: An IV tube. A shot (injection) into a muscle. Eating and drinking Talk with your doctor before changing the foods you eat. Your doctor may tell you to eat foods that have a lot of iron, such as: Liver. Low-fat (lean) beef. Breads and cereals that have iron added to them. Eggs. Dried fruit. Dark green, leafy vegetables. Eat fresh fruits and vegetables that are high in vitamin C. They help your body use iron. Foods with a lot of vitamin C include: Oranges. Peppers. Tomatoes. Mangoes. Managing constipation If you are taking iron pills, they may cause trouble pooping (constipation). To prevent or treat this, you may need to: Drink enough fluid to keep your pee (urine) pale yellow. Take over-the-counter or prescription medicines. Eat foods that are high in fiber. These include beans, whole grains, and fresh fruits and vegetables. Limit foods that are high in fat and sugar. These include fried or sweet foods. General instructions Return to your normal activities when your doctor says that it is safe. Keep all follow-up visits. Contact a doctor if: You feel like you may vomit (nauseous), or you vomit. You feel weak. You get light-headed when getting up from sitting or lying down. You are sweating for no reason. You have trouble pooping. You have worse breathing with physical activity. You have heaviness in your chest. Get help right away if: You faint. If this happens, do not drive yourself  to the hospital. You have a fast heartbeat, or a heartbeat that does not feel regular. Summary Iron deficiency anemia happens when you have too little iron in your body. This condition is treated by finding out why you do not have enough iron in your body and then getting more iron. Take over-the-counter and prescription medicines only as told by your doctor. Eat fresh fruits and vegetables  that are high in vitamin C. Contact a doctor if you have trouble pooping or feel weak. This information is not intended to replace advice given to you by your health care provider. Make sure you discuss any questions you have with your health care provider. Document Revised: 01/12/2022 Document Reviewed: 01/12/2022 Elsevier Patient Education  2024 [employer].

## 2024-02-25 ENCOUNTER — Other Ambulatory Visit (HOSPITAL_COMMUNITY): Payer: Self-pay

## 2024-02-27 ENCOUNTER — Encounter: Payer: Self-pay | Admitting: Nurse Practitioner

## 2024-02-27 ENCOUNTER — Ambulatory Visit (INDEPENDENT_AMBULATORY_CARE_PROVIDER_SITE_OTHER): Payer: Medicare HMO | Admitting: Nurse Practitioner

## 2024-02-27 VITALS — BP 136/56 | HR 86 | Temp 98.2°F | Ht 70.0 in | Wt 233.2 lb

## 2024-02-27 DIAGNOSIS — D500 Iron deficiency anemia secondary to blood loss (chronic): Secondary | ICD-10-CM

## 2024-02-27 NOTE — Assessment & Plan Note (Signed)
 Chronic with intermittent episodes of low levels presenting, has receive infusions in past and had GI work-up. Recent levels were lower and started on Vitron, taking this daily without ADR.  Recheck labs today, may be too early to see full benefit (4 weeks) but will see if any trend up.  Goal is to avoid infusions if possible, but he will return if needed to see hematology.  Denies any recent bleeding.

## 2024-02-27 NOTE — Progress Notes (Signed)
   02/27/2024  Patient ID: Ernest [name], male   DOB: 28-Oct-1951, 73 y.o.   MRN: [id number]  Provider portion of [employer] patient assistance program application for Ozempic 0.5 mg weekly has not yet been received by the medication assistance team.  Plre-filled information on provider portion, and placed in PCPs folder to review, sign/date, and fax back to medication assistance team.  Lenna Gilford, PharmD, DPLA

## 2024-02-27 NOTE — Progress Notes (Signed)
 BP (!) 136/56   Pulse 86   Temp 98.2 F (36.8 C) (Oral)   Ht 5\' 10"  (1.778 m)   Wt 233 lb 3.2 oz (105.8 kg)   SpO2 96%   BMI 33.46 kg/m    Subjective:    Patient ID: Ernest [name], male    DOB: September 02, 1951, 72 y.o.   MRN: [id number]  HPI: Ernest [name] is a 73 y.o. male  Chief Complaint  Patient presents with   Anemia   ANEMIA Follow-up for iron deficiency anemia noted on labs 01/30/24.  Ordered Vitron at time and he has been taking as ordered.  No side effects with this.  Has history of similar and had to have iron infusions, last in 2022 (08/09/21).  Did have colonoscopy, 05/23/2021, with polyps noted. Anemia status: stable Etiology of anemia: iron deficiency -- unknown Duration of anemia treatment: chronic with acute episodes Compliance with treatment: good compliance Iron supplementation side effects: no Severity of anemia: moderate Fatigue:  occasionally Decreased exercise tolerance: occasionally  Dyspnea on exertion: no Palpitations: no Bleeding: no Pica: no   Relevant past medical, surgical, family and social history reviewed and updated as indicated. Interim medical history since our last visit reviewed. Allergies and medications reviewed and updated.  Review of Systems  Constitutional:  Positive for fatigue (occasional). Negative for activity change, appetite change, diaphoresis and fever.  Respiratory:  Negative for cough, chest tightness, shortness of breath and wheezing.   Cardiovascular:  Negative for chest pain, palpitations and leg swelling.  Gastrointestinal: Negative.   Neurological: Negative.   Psychiatric/Behavioral: Negative.      Per HPI unless specifically indicated above     Objective:    BP (!) 136/56   Pulse 86   Temp 98.2 F (36.8 C) (Oral)   Ht 5\' 10"  (1.778 m)   Wt 233 lb 3.2 oz (105.8 kg)   SpO2 96%   BMI 33.46 kg/m   Wt Readings from Last 3 Encounters:  02/27/24 233 lb 3.2 oz (105.8 kg)  01/30/24 233 lb (105.7 kg)   10/30/23 231 lb 3.2 oz (104.9 kg)    Physical Exam Vitals and nursing note reviewed.  Constitutional:      General: He is awake. He is not in acute distress.    Appearance: He is well-developed and well-groomed. He is obese. He is not ill-appearing or toxic-appearing.     Comments: Less facial paleness today.  Palmar creases pink.  HENT:     Head: Normocephalic.     Right Ear: Hearing and external ear normal.     Left Ear: Hearing and external ear normal.  Eyes:     General: Lids are normal.     Extraocular Movements: Extraocular movements intact.     Conjunctiva/sclera: Conjunctivae normal.  Neck:     Thyroid: No thyromegaly.     Vascular: No carotid bruit.  Cardiovascular:     Rate and Rhythm: Normal rate and regular rhythm.     Heart sounds: Normal heart sounds.  Pulmonary:     Effort: Pulmonary effort is normal. No accessory muscle usage or respiratory distress.     Breath sounds: Normal breath sounds.  Abdominal:     General: Bowel sounds are normal. There is no distension.     Palpations: Abdomen is soft.     Tenderness: There is no abdominal tenderness.  Musculoskeletal:     Cervical back: Full passive range of motion without pain.     Right lower leg: No  edema.     Left lower leg: No edema.  Lymphadenopathy:     Cervical: No cervical adenopathy.  Skin:    General: Skin is warm.     Capillary Refill: Capillary refill takes less than 2 seconds.  Neurological:     Mental Status: He is alert and oriented to person, place, and time.     Deep Tendon Reflexes: Reflexes are normal and symmetric.     Reflex Scores:      Brachioradialis reflexes are 2+ on the right side and 2+ on the left side.      Patellar reflexes are 2+ on the right side and 2+ on the left side. Psychiatric:        Attention and Perception: Attention normal.        Mood and Affect: Mood normal.        Speech: Speech normal.        Behavior: Behavior normal. Behavior is cooperative.        Thought  Content: Thought content normal.     Results for orders placed or performed in visit on 01/30/24  Bayer DCA Hb A1c Waived   Collection Time: 01/30/24  1:27 PM  Result Value Ref Range   HB A1C (BAYER DCA - WAIVED) 5.9 (H) 4.8 - 5.6 %  Microalbumin, Urine Waived   Collection Time: 01/30/24  1:27 PM  Result Value Ref Range   Microalb, Ur Waived 80 (H) 0 - 19 mg/L   Creatinine, Urine Waived 300 10 - 300 mg/dL   Microalb/Creat Ratio 30-300 (H) <30 mg/g  Comprehensive metabolic panel   Collection Time: 01/30/24  1:28 PM  Result Value Ref Range   Glucose 110 (H) 70 - 99 mg/dL   BUN 16 8 - 27 mg/dL   Creatinine, Ser 1.61 0.76 - 1.27 mg/dL   eGFR 64 >09 UE/AVW/0.98   BUN/Creatinine Ratio 13 10 - 24   Sodium 142 134 - 144 mmol/L   Potassium 4.0 3.5 - 5.2 mmol/L   Chloride 102 96 - 106 mmol/L   CO2 25 20 - 29 mmol/L   Calcium 9.6 8.6 - 10.2 mg/dL   Total Protein 6.6 6.0 - 8.5 g/dL   Albumin 4.6 3.8 - 4.8 g/dL   Globulin, Total 2.0 1.5 - 4.5 g/dL   Bilirubin Total 1.5 (H) 0.0 - 1.2 mg/dL   Alkaline Phosphatase 88 44 - 121 IU/L   AST 15 0 - 40 IU/L   ALT 14 0 - 44 IU/L  Lipid Panel w/o Chol/HDL Ratio   Collection Time: 01/30/24  1:28 PM  Result Value Ref Range   Cholesterol, Total 67 (L) 100 - 199 mg/dL   Triglycerides 84 0 - 149 mg/dL   HDL 32 (L) >11 mg/dL   VLDL Cholesterol Cal 17 5 - 40 mg/dL   LDL Chol Calc (NIH) 18 0 - 99 mg/dL  Ferritin   Collection Time: 01/30/24  1:28 PM  Result Value Ref Range   Ferritin 21 (L) 30 - 400 ng/mL  Iron   Collection Time: 01/30/24  1:28 PM  Result Value Ref Range   Iron 25 (L) 38 - 169 ug/dL  TSH   Collection Time: 01/30/24  1:28 PM  Result Value Ref Range   TSH 4.340 0.450 - 4.500 uIU/mL  VITAMIN D 25 Hydroxy (Vit-D Deficiency, Fractures)   Collection Time: 01/30/24  1:28 PM  Result Value Ref Range   Vit D, 25-Hydroxy 43.7 30.0 - 100.0 ng/mL  Vitamin B12  Collection Time: 01/30/24  1:28 PM  Result Value Ref Range   Vitamin B-12  978 232 - 1,245 pg/mL  CBC with Differential/Platelet   Collection Time: 01/30/24  1:28 PM  Result Value Ref Range   WBC 4.2 3.4 - 10.8 x10E3/uL   RBC 4.75 4.14 - 5.80 x10E6/uL   Hemoglobin 9.3 (L) 13.0 - 17.7 g/dL   Hematocrit 47.8 (L) 29.5 - 51.0 %   MCV 71 (L) 79 - 97 fL   MCH 19.6 (L) 26.6 - 33.0 pg   MCHC 27.4 (L) 31.5 - 35.7 g/dL   RDW 62.1 (H) 30.8 - 65.7 %   Platelets 196 150 - 450 x10E3/uL   Neutrophils 53 Not Estab. %   Lymphs 37 Not Estab. %   Monocytes 6 Not Estab. %   Eos 3 Not Estab. %   Basos 1 Not Estab. %   Neutrophils Absolute 2.2 1.4 - 7.0 x10E3/uL   Lymphocytes Absolute 1.6 0.7 - 3.1 x10E3/uL   Monocytes Absolute 0.3 0.1 - 0.9 x10E3/uL   EOS (ABSOLUTE) 0.1 0.0 - 0.4 x10E3/uL   Basophils Absolute 0.0 0.0 - 0.2 x10E3/uL   Immature Granulocytes 0 Not Estab. %   Immature Grans (Abs) 0.0 0.0 - 0.1 x10E3/uL      Assessment & Plan:   Problem List Items Addressed This Visit       Other   Iron deficiency anemia due to chronic blood loss - Primary   Chronic with intermittent episodes of low levels presenting, has receive infusions in past and had GI work-up. Recent levels were lower and started on Vitron, taking this daily without ADR.  Recheck labs today, may be too early to see full benefit (4 weeks) but will see if any trend up.  Goal is to avoid infusions if possible, but he will return if needed to see hematology.  Denies any recent bleeding.      Relevant Orders   CBC with Differential/Platelet   Ferritin   Iron   Folate     Follow up plan: Return for as scheduled in May.

## 2024-02-28 LAB — CBC WITH DIFFERENTIAL/PLATELET
Basophils Absolute: 0 10*3/uL (ref 0.0–0.2)
Basos: 1 %
EOS (ABSOLUTE): 0.1 10*3/uL (ref 0.0–0.4)
Eos: 2 %
Hematocrit: 33.4 % — ABNORMAL LOW (ref 37.5–51.0)
Hemoglobin: 9.3 g/dL — ABNORMAL LOW (ref 13.0–17.7)
Immature Grans (Abs): 0 10*3/uL (ref 0.0–0.1)
Immature Granulocytes: 0 %
Lymphocytes Absolute: 1.7 10*3/uL (ref 0.7–3.1)
Lymphs: 38 %
MCH: 21.3 pg — ABNORMAL LOW (ref 26.6–33.0)
MCHC: 27.8 g/dL — ABNORMAL LOW (ref 31.5–35.7)
MCV: 77 fL — ABNORMAL LOW (ref 79–97)
Monocytes Absolute: 0.3 10*3/uL (ref 0.1–0.9)
Monocytes: 6 %
Neutrophils Absolute: 2.4 10*3/uL (ref 1.4–7.0)
Neutrophils: 53 %
Platelets: 212 10*3/uL (ref 150–450)
RBC: 4.36 x10E6/uL (ref 4.14–5.80)
RDW: 21.6 % — ABNORMAL HIGH (ref 11.6–15.4)
WBC: 4.5 10*3/uL (ref 3.4–10.8)

## 2024-02-28 LAB — FOLATE: Folate: 11.2 ng/mL (ref >3.0)

## 2024-02-28 LAB — FERRITIN: Ferritin: 32 ng/mL (ref 30–400)

## 2024-02-28 LAB — IRON: Iron: 83 ug/dL (ref 38–169)

## 2024-02-28 NOTE — Telephone Encounter (Signed)
 Received provider portion of PAP Novo Nordisk faxed it to Company will give a few days to follow up on status of application.

## 2024-02-29 ENCOUNTER — Other Ambulatory Visit: Payer: Self-pay | Admitting: Nurse Practitioner

## 2024-02-29 DIAGNOSIS — D500 Iron deficiency anemia secondary to blood loss (chronic): Secondary | ICD-10-CM

## 2024-02-29 NOTE — Progress Notes (Signed)
 Needs 4 week outpatient lab visit please  Spoke to patient on phone with results.  Has been on iron for 4 weeks only.  Will repeat labs in 4 weeks and if H/H remains low may send to hematology.  Iron is beginning to creep up.

## 2024-03-03 NOTE — Progress Notes (Signed)
 Lab appt scheduled.

## 2024-03-03 NOTE — Telephone Encounter (Signed)
 Following up on pt PAP Novo Nordisk [employer]) pt has been APPROVED for 2025,gave pt a call letting him know med has been APPROVED to expect a call from Dr office the company will be mailing to Dr office with in 10-14 days pt is aware.

## 2024-03-12 DIAGNOSIS — H25811 Combined forms of age-related cataract, right eye: Secondary | ICD-10-CM | POA: Diagnosis not present

## 2024-03-17 ENCOUNTER — Ambulatory Visit: Payer: Medicare HMO

## 2024-03-17 ENCOUNTER — Telehealth: Payer: Self-pay

## 2024-03-17 VITALS — BP 136/56 | Ht 70.0 in | Wt 233.0 lb

## 2024-03-17 DIAGNOSIS — Z789 Other specified health status: Secondary | ICD-10-CM

## 2024-03-17 DIAGNOSIS — Z5941 Food insecurity: Secondary | ICD-10-CM

## 2024-03-17 DIAGNOSIS — Z5320 Procedure and treatment not carried out because of patient's decision for unspecified reasons: Secondary | ICD-10-CM

## 2024-03-17 DIAGNOSIS — Z28310 Unvaccinated for covid-19: Secondary | ICD-10-CM

## 2024-03-17 DIAGNOSIS — Z0000 Encounter for general adult medical examination without abnormal findings: Secondary | ICD-10-CM | POA: Diagnosis not present

## 2024-03-17 NOTE — Patient Instructions (Signed)
 [name] , Thank you for taking time to come for your Medicare Wellness Visit. I appreciate your ongoing commitment to your health goals. Please review the following plan we discussed and let me know if I can assist you in the future.   Referrals/Orders/Follow-Ups/Clinician Recommendations: follow up in one year.  This is a list of the screening recommended for you and due dates:  Health Maintenance  Topic Date Due   COVID-19 Vaccine (3 - Pfizer risk series) 01/10/2021   Eye exam for diabetics  06/15/2023   Complete foot exam   01/25/2024   Colon Cancer Screening  05/23/2024   Zoster (Shingles) Vaccine (1 of 2) 05/26/2024*   Hemoglobin A1C  07/29/2024   Yearly kidney function blood test for diabetes  01/29/2025   Yearly kidney health urinalysis for diabetes  01/29/2025   Medicare Annual Wellness Visit  03/17/2025   DTaP/Tdap/Td vaccine (3 - Td or Tdap) 01/24/2033   Pneumonia Vaccine  Completed   Flu Shot  Completed   Hepatitis C Screening  Completed   HPV Vaccine  Aged Out  *Topic was postponed. The date shown is not the original due date.    Advanced directives: (Copy Requested) Please bring a copy of your health care power of attorney and living will to the office to be added to your chart at your convenience. You can mail to [facility] [address] [place], [place] [address] or email to [email or web address]  Next Medicare Annual Wellness Visit scheduled for next year: Yes

## 2024-03-17 NOTE — Progress Notes (Signed)
 Because this visit was a virtual/telehealth visit,  certain criteria was not obtained, such a blood pressure, CBG if applicable, and timed get up and go. Any medications not marked as "taking" were not mentioned during the medication reconciliation part of the visit. Any vitals not documented were not able to be obtained due to this being a telehealth visit or patient was unable to self-report a recent blood pressure reading due to a lack of equipment at home via telehealth. Vitals that have been documented are verbally provided by the patient.   Subjective:   Ernest [name] is a 73 y.o. who presents for a Medicare Wellness preventive visit.  Visit Complete: Virtual I connected with  Ernest [name] on 03/17/24 by a audio enabled telemedicine application and verified that I am speaking with the correct person using two identifiers.  Patient Location: Home  Provider Location: Home Office  I discussed the limitations of evaluation and management by telemedicine. The patient expressed understanding and agreed to proceed.  Vital Signs: Because this visit was a virtual/telehealth visit, some criteria may be missing or patient reported. Any vitals not documented were not able to be obtained and vitals that have been documented are patient reported.  VideoDeclined- This patient declined Librarian, academic. Therefore the visit was completed with audio only.  Persons Participating in Visit: Patient.  AWV Questionnaire: No: Patient Medicare AWV questionnaire was not completed prior to this visit.  Cardiac Risk Factors include: advanced age (>54men, >89 women);male gender;hypertension;diabetes mellitus;obesity (BMI >30kg/m2);dyslipidemia     Objective:    Today's Vitals   03/17/24 1533 03/17/24 1535  BP: (!) 136/56   Weight: 233 lb (105.7 kg)   Height: 5\' 10"  (1.778 m)   PainSc:  0-No pain   Body mass index is 33.43 kg/m.     03/17/2024    3:39 PM  03/12/2023    3:38 PM 08/19/2021    2:21 PM 08/09/2021    2:38 PM 05/23/2021    8:57 AM 04/11/2021    1:28 PM 04/04/2021   10:52 AM  Advanced Directives  Does Patient Have a Medical Advance Directive? No Yes No No No No No  Type of Furniture conservator/restorer;Living will       Does patient want to make changes to medical advance directive?  No - Patient declined       Copy of Healthcare Power of Attorney in Chart?  Yes - validated most recent copy scanned in chart (See row information)       Would patient like information on creating a medical advance directive? No - Patient declined  No - Patient declined Yes (MAU/Ambulatory/Procedural Areas - Information given)  No - Patient declined Yes (MAU/Ambulatory/Procedural Areas - Information given)    Current Medications (verified) Outpatient Encounter Medications as of 03/17/2024  Medication Sig   ACCU-CHEK GUIDE test strip Use to check BG daily   Accu-Chek Softclix Lancets lancets by Other route. Use to check BG daily   albuterol (VENTOLIN HFA) 108 (90 Base) MCG/ACT inhaler Inhale 2 puffs into the lungs every 4 (four) hours as needed for wheezing or shortness of breath. Uses 2-3 times daily   atorvastatin (LIPITOR) 80 MG tablet Take 1 tablet (80 mg total) by mouth in the morning.   Blood Glucose Monitoring Suppl (ACCU-CHEK GUIDE ME) w/Device KIT Use to check blood sugars 2-3 times daily with goals = <130 fasting in morning and <180 two hours after eating.  Bring blood  sugar log to appointments.   Budeson-Glycopyrrol-Formoterol (BREZTRI AEROSPHERE) 160-9-4.8 MCG/ACT AERO Inhale 2 puffs into the lungs 2 (two) times daily.   Cholecalciferol 1.25 MG (50000 UT) TABS Take 1 tablet by mouth once a week.   clotrimazole-betamethasone (LOTRISONE) cream Apply topically 2 (two) times daily. APPLY ONE application TOPICALLY twice daily AS DIRECTED   dapagliflozin propanediol (FARXIGA) 10 MG TABS tablet Take 1 tablet (10 mg total) by mouth daily.    diltiazem (CARDIZEM CD) 180 MG 24 hr capsule Take 1 capsule (180 mg total) by mouth daily.   ezetimibe (ZETIA) 10 MG tablet Take 1 tablet (10 mg total) by mouth daily.   furosemide (LASIX) 20 MG tablet TAKE ONE TABLET BY MOUTH EVERY MORNING and TAKE ONE TABLET BY MOUTH EVERYDAY AT BEDTIME   gabapentin (NEURONTIN) 600 MG tablet TAKE TWO TABLETS BY MOUTH EVERYDAY AT BEDTIME   Iron-Vitamin C 65-125 MG TABS Take 1 tablet by mouth daily.   isosorbide mononitrate (IMDUR) 60 MG 24 hr tablet TAKE ONE TABLET BY MOUTH EVERY MORNING   ketorolac (ACULAR) 0.5 % ophthalmic solution Place 1 drop into the left eye 4 (four) times daily.   metFORMIN (GLUCOPHAGE-XR) 500 MG 24 hr tablet Take 1 tablet (500 mg total) by mouth in the morning and at bedtime.   metoprolol succinate (TOPROL-XL) 50 MG 24 hr tablet TAKE 1 TABLET BY MOUTH ONCE DAILY WITH OR IMMEDIATELY FOLLOWING A MEAL   nitroGLYCERIN (NITROSTAT) 0.4 MG SL tablet Place 1 tablet (0.4 mg total) under the tongue every 5 (five) minutes as needed for chest pain.   pantoprazole (PROTONIX) 40 MG tablet Take 1 tablet (40 mg total) by mouth daily.   prednisoLONE acetate (PRED FORTE) 1 % ophthalmic suspension Place 1 drop into the left eye 4 (four) times daily.   ramipril (ALTACE) 2.5 MG capsule Take 1 capsule (2.5 mg total) by mouth daily.   rivaroxaban (XARELTO) 20 MG TABS tablet Take 1 tablet (20 mg total) by mouth every evening.   Semaglutide,0.25 or 0.5MG /DOS, (OZEMPIC, 0.25 OR 0.5 MG/DOSE,) 2 MG/3ML SOPN Inject 0.5 mg into the skin once a week.   trimethoprim-polymyxin b (POLYTRIM) ophthalmic solution Place 1 drop into the left eye 4 (four) times daily.   No facility-administered encounter medications on file as of 03/17/2024.    Allergies (verified) Doxycycline   History: Past Medical History:  Diagnosis Date   A-fib (HCC)    Anemia    Asthma    COPD (chronic obstructive pulmonary disease) (HCC)    Diabetes mellitus    Type II   Early Pulmonary  fibrosis (HCC)    GERD (gastroesophageal reflux disease)    History of echocardiogram    a. 02/2017 Echo: EF 60-65%, no rwma, mild MR, mod dil LA. Nl RV fxn. PASP .   Hyperlipidemia    Hypertension    Morbid obesity (HCC)    Myocardial infarction [facility])    "light"   Non-obstructive CAD (coronary artery disease)    a. 2011 Cath: nonobs dzs; b. 07/2014 Cath: LM 30, LAD nl, LCX nl, RCA 20p, 57m.   Permanent atrial fibrillation (HCC)    a. CHA2DS2VASc = 4-->xarelto.   Polyp of sigmoid colon    Past Surgical History:  Procedure Laterality Date   ANKLE SURGERY     CARDIAC CATHETERIZATION  05/2010   [facility]   CARDIAC CATHETERIZATION     [facility]   CARDIAC [facility]HETERIZATION     [facility]   CARDIAC CATHETERIZATION  07/2014   [facility]  CATARACT EXTRACTION Left 01/16/2024   COLONOSCOPY     COLONOSCOPY WITH PROPOFOL N/A 06/26/2018   Procedure: COLONOSCOPY WITH PROPOFOL;  Surgeon: Pasty Spillers, MD;  Location: [facility] ENDOSCOPY;  Service: Endoscopy;  Laterality: N/A;   COLONOSCOPY WITH PROPOFOL N/A 05/23/2021   Procedure: COLONOSCOPY WITH PROPOFOL;  Surgeon: Pasty Spillers, MD;  Location: [facility] ENDOSCOPY;  Service: Endoscopy;  Laterality: N/A;   CORONARY ARTERY BYPASS GRAFT     CORONARY STENT INTERVENTION N/A 03/07/2019   Procedure: CORONARY STENT INTERVENTION;  Surgeon: Yvonne Kendall, MD;  Location: [facility] INVASIVE CV LAB;  Service: Cardiovascular;  Laterality: N/A;   ENTEROSCOPY N/A 08/05/2018   Procedure: ENTEROSCOPY;  Surgeon: Pasty Spillers, MD;  Location: [facility] ENDOSCOPY;  Service: Endoscopy;  Laterality: N/A;   ESOPHAGOGASTRODUODENOSCOPY (EGD) WITH PROPOFOL N/A 06/26/2018   Procedure: ESOPHAGOGASTRODUODENOSCOPY (EGD) WITH PROPOFOL;  Surgeon: Pasty Spillers, MD;  Location: [facility] ENDOSCOPY;  Service: Endoscopy;  Laterality: N/A;   ESOPHAGOGASTRODUODENOSCOPY (EGD) WITH PROPOFOL N/A 05/23/2021   Procedure: PUSH ESOPHAGOGASTRODUODENOSCOPY (EGD) WITH PROPOFOL;  Surgeon: Pasty Spillers, MD;  Location: [facility] ENDOSCOPY;  Service: Endoscopy;  Laterality: N/A;   LEFT HEART CATH AND CORONARY ANGIOGRAPHY N/A 03/07/2019   Procedure: LEFT HEART CATH AND CORONARY ANGIOGRAPHY;  Surgeon: Yvonne Kendall, MD;  Location: [facility] INVASIVE CV LAB;  Service: Cardiovascular;  Laterality: N/A;   TESTICLE SURGERY  70's   TUMOR EXCISION     Neck and finger; benign   WRIST SURGERY     Family History  Problem Relation Age of Onset   Hypertension Mother    Hyperlipidemia Mother    Diabetes Mother    Heart disease Mother        CABG   Pneumonia Father        rare type   Diabetes Other    Depression Other    Coronary artery disease Other    Alcohol abuse Other    Hypertension Other    Hyperlipidemia Other    Cancer Brother    Kidney Stones Brother    Social History   Socioeconomic History   Marital status: Widowed    Spouse name: Not on file   Number of children: Not on file   Years of education: Not on file   Highest education level: 8th grade  Occupational History   Occupation: part time   Tobacco Use   Smoking status: Former    Current packs/day: 0.00    Average packs/day: 4.0 packs/day for 28.0 years (112.0 ttl pk-yrs)    Types: Cigarettes    Start date: 08/25/1965    Quit date: 08/25/1993    Years since quitting: 30.5   Smokeless tobacco: Never  Vaping Use   Vaping status: Never Used  Substance and Sexual Activity   Alcohol use: Not Currently   Drug use: No   Sexual activity: Not Currently  Other Topics Concern   Not on file  Social History Narrative   No regular exercise.   Social Drivers of Corporate investment banker Strain: Low Risk  (03/17/2024)   Overall Financial Resource Strain (CARDIA)    Difficulty of Paying Living Expenses: Not very hard  Food Insecurity: No Food Insecurity (03/17/2024)   Hunger Vital Sign    Worried About Running Out of Food in the Last Year: Never true    Ran Out of Food in the Last Year: Never true  Transportation Needs:  No Transportation Needs (03/17/2024)   PRAPARE - Administrator, Civil Service (Medical):  No    Lack of Transportation (Non-Medical): No  Physical Activity: Insufficiently Active (03/17/2024)   Exercise Vital Sign    Days of Exercise per Week: 3 days    Minutes of Exercise per Session: 30 min  Stress: No Stress Concern Present (03/17/2024)   [employer] of Occupational Health - Occupational Stress Questionnaire    Feeling of Stress : Only a little  Social Connections: Socially Isolated (03/17/2024)   Social Connection and Isolation Panel [NHANES]    Frequency of Communication with Friends and Family: Twice a week    Frequency of Social Gatherings with Friends and Family: Twice a week    Attends Religious Services: Never    Database administrator or Organizations: No    Attends Banker Meetings: Never    Marital Status: Widowed    Tobacco Counseling Counseling given: Not Answered    Clinical Intake:  Pre-visit preparation completed: Yes  Pain : No/denies pain Pain Score: 0-No pain     BMI - recorded: 33.43 Nutritional Status: BMI > 30  Obese Nutritional Risks: None Diabetes: Yes CBG done?: No Did pt. bring in CBG monitor from home?: No  Lab Results  Component Value Date   HGBA1C 5.9 (H) 01/30/2024   HGBA1C 6.2 (H) 10/30/2023   HGBA1C 6.3 (H) 07/26/2023     How often do you need to have someone help you when you read instructions, pamphlets, or other written materials from your doctor or pharmacy?: 1 - Never  Interpreter Needed?: No  Information entered by :: Estell Harpin   Activities of Daily Living     03/17/2024    3:38 PM  In your present state of health, do you have any difficulty performing the following activities:  Hearing? 1  Comment in left ear  Vision? 0  Difficulty concentrating or making decisions? 0  Walking or climbing stairs? 0  Dressing or bathing? 0  Doing errands, shopping? 0  Preparing Food and  eating ? N  Using the Toilet? N  In the past six months, have you accidently leaked urine? N  Do you have problems with loss of bowel control? N  Managing your Medications? N  Managing your Finances? N  Housekeeping or managing your Housekeeping? N    Patient Care Team: Marjie Skiff, NP as PCP - General (Nurse Practitioner) Antonieta Iba, MD as PCP - Cardiology (Cardiology) Antonieta Iba, MD as Consulting Physician (Cardiology) Lenna Gilford, [facility] (Pharmacist)  Indicate any recent Medical Services you may have received from other than [facility] providers in the past year (date may be approximate).     Assessment:   This is a routine wellness examination for [employer].  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   It is important to check your weight daily and watch how much salt and liquids you have.  It will help you to manage your heart failure.    Notes:        Depression Screen     03/17/2024    3:41 PM 01/30/2024    1:23 PM 07/26/2023    8:35 AM 03/12/2023    3:34 PM 04/03/2022   10:46 AM 02/15/2022   10:26 AM 12/13/2021   10:32 AM  PHQ 2/9 Scores  PHQ - 2 Score 0 0  0 0 0 0  PHQ- 9 Score 0 0  0 0  0  Exception Documentation   Patient refusal        Fall Risk     03/17/2024    3:39 PM 01/30/2024    1:23 PM 03/12/2023    3:39 PM  10/10/2022   10:51 AM 04/03/2022   10:46 AM  Fall Risk   Falls in the past year? 0 0 0 1 0  Number falls in past yr: 0 0 0 1 0  Injury with Fall? 0 0 0 1 0  Risk for fall due to : No Fall Risks No Fall Risks No Fall Risks No Fall Risks No Fall Risks  Follow up Falls evaluation completed Falls evaluation completed Falls prevention discussed;Falls evaluation completed Falls evaluation completed Falls evaluation completed    MEDICARE RISK AT HOME:  Medicare Risk at Home Any stairs in or around the home?: Yes If so, are there any without handrails?: No Home free of loose throw rugs in walkways, pet beds, electrical cords, etc?: Yes Adequate lighting in your home to reduce risk of falls?: Yes Life alert?: No Use of a cane, walker or w/c?: No Grab bars in the bathroom?: No Shower chair or bench in shower?: No Elevated toilet seat or a handicapped toilet?: No  TIMED UP AND GO:  Was the test performed?  No  Cognitive Function: 6CIT completed        03/17/2024    3:36 PM 03/12/2023    3:48 PM 02/14/2021    8:19 AM 02/03/2019    8:54 AM 02/01/2018    8:27 AM  6CIT Screen  What Year? 0 points 0 points 0 points 0 points 0 points  What month? 0 points 0 points 0 points 0 points 0 points  What time? 0 points 0 points 0 points 0 points 0 points  Count back from 20 0 points 0 points 0 points 0 points 0 points  Months in reverse 0 points 0 points 0 points 0 points 0 points  Repeat phrase 0 points 2 points 4 points 2 points 2 points  Total Score 0 points 2 points 4 points 2 points 2 points    Immunizations Immunization History  Administered Date(s) Administered   Fluad Quad(high Dose 65+) 11/21/2019, 11/19/2020, 09/12/2021, 10/10/2022   Fluad Trivalent(High Dose 65+) 08/29/2023   Influenza Split 08/25/2013   Influenza, High Dose Seasonal PF 09/20/2016, 11/02/2017, 10/10/2018   Influenza,inj,Quad PF,6+ Mos 11/17/2015   Influenza-Unspecified 12/23/2014   PFIZER(Purple Top)SARS-COV-2  Vaccination 11/22/2020, 12/13/2020   Pneumococcal Conjugate-13 11/25/2015   Pneumococcal Polysaccharide-23 10/25/2013, 02/03/2019   Pneumococcal-Unspecified 10/26/2009   Td 01/24/2023  Tdap 11/06/2012   Zoster, Live 02/04/2013    Screening Tests Health Maintenance  Topic Date Due   COVID-19 Vaccine (3 - Pfizer risk series) 01/10/2021   OPHTHALMOLOGY EXAM  06/15/2023   FOOT EXAM  01/25/2024   Colonoscopy  05/23/2024   Zoster Vaccines- Shingrix (1 of 2) 05/26/2024 (Originally 05/06/1970)   HEMOGLOBIN A1C  07/29/2024   Diabetic kidney evaluation - eGFR measurement  01/29/2025   Diabetic kidney evaluation - Urine ACR  01/29/2025   Medicare Annual Wellness (AWV)  03/17/2025   DTaP/Tdap/Td (3 - Td or Tdap) 01/24/2033   Pneumonia Vaccine 71+ Years old  Completed   INFLUENZA VACCINE  Completed   Hepatitis C Screening  Completed   HPV VACCINES  Aged Out    Health Maintenance  Health Maintenance Due  Topic Date Due   COVID-19 Vaccine (3 - Pfizer risk series) 01/10/2021   OPHTHALMOLOGY EXAM  06/15/2023   FOOT EXAM  01/25/2024   Colonoscopy  05/23/2024   Health Maintenance Items Addressed:  Additional Screening:  Vision Screening: Recommended annual ophthalmology exams for early detection of glaucoma and other disorders of the eye.  Dental Screening: Recommended annual dental exams for proper oral hygiene  Community Resource Referral / Chronic Care Management: CRR required this visit?  Yes   CCM required this visit?  No     Plan:     I have personally reviewed and noted the following in the patient's chart:   Medical and social history Use of alcohol, tobacco or illicit drugs  Current medications and supplements including opioid prescriptions. Patient is not currently taking opioid prescriptions. Functional ability and status Nutritional status Physical activity Advanced directives List of other physicians Hospitalizations, surgeries, and ER visits in previous 12  months Vitals Screenings to include cognitive, depression, and falls Referrals and appointments  In addition, I have reviewed and discussed with patient certain preventive protocols, quality metrics, and best practice recommendations. A written personalized care plan for preventive services as well as general preventive health recommendations were provided to patient.     Rudi Heap, [place]   03/17/2024   After Visit Summary: (MyChart) Due to this being a telephonic visit, the after visit summary with patients personalized plan was offered to patient via MyChart   Notes: Nothing significant to report at this time.

## 2024-03-17 NOTE — Telephone Encounter (Signed)
 Called patient to advise that his Ozempic was ready for pick-up. Patient acknowledged understanding.

## 2024-03-19 ENCOUNTER — Telehealth: Payer: Self-pay

## 2024-03-19 NOTE — Progress Notes (Signed)
 Complex Care Management Note  Care Guide Note 03/19/2024 Name: Ernest [name] MRN: [id number] DOB: [birth year]  Ernest [name] is a 73 y.o. year old male who sees Aura Dials T, NP for primary care. I reached out to Ernest [name] by phone today to offer complex care management services.  [name] was given information about Complex Care Management services today including:   The Complex Care Management services include support from the care team which includes your Nurse Care Manager, Clinical Social Worker, or Pharmacist.  The Complex Care Management team is here to help remove barriers to the health concerns and goals most important to you. Complex Care Management services are voluntary, and the patient may decline or stop services at any time by request to their care team member.   Complex Care Management Consent Status: Patient agreed to services and verbal consent obtained.   Follow up plan:  Telephone appointment with complex care management team member scheduled for:  04/11/2024  Encounter Outcome:  Patient Scheduled

## 2024-03-19 NOTE — Progress Notes (Signed)
   Telephone encounter was:  Successful.  Complex Care Management Note Care Guide Note  03/19/2024 Name: [name] MRN: [id number] DOB: 07-12-1951  Cloy Cozzens is a 73 y.o. year old male who is a primary care patient of Cannady, Dorie Rank, NP . The community resource team was consulted for assistance with Food Insecurity, Financial Difficulties related to financial strain, and [employer] and RX  SDOH screenings and interventions completed:  Yes  Social Drivers of Health From This Encounter   Food Insecurity: Food Insecurity Present (03/19/2024)   Hunger Vital Sign    Worried About Running Out of Food in the Last Year: Often true    Ran Out of Food in the Last Year: Often true  Financial Resource Strain: High Risk (03/19/2024)   Overall Financial Resource Strain (CARDIA)    Difficulty of Paying Living Expenses: Very hard  Utilities: At Risk (03/19/2024)   Utilities    Threatened with loss of utilities: Yes    SDOH Interventions Today    Flowsheet Row Most Recent Value  SDOH Interventions   Utilities Interventions Community Resources Provided, [id number] Referral  Financial Strain Interventions Community Resources Provided, [id number] Referral        Care guide performed the following interventions: Patient provided with information about care guide support team and interviewed to confirm resource needs. Pt is having financial strain PT cant afford his RX, blood thinner $150 He is out of his RX, (added referral). Patient is struggling to buy food and pay billls. I have added referrals for [employer] and [facility], [facility] and mailing resources   Follow Up Plan:  Care guide will follow up with patient by phone over the next 7 days  Encounter Outcome:  Patient Visit Completed    Lenard Forth [facility]  [facility] Guide, Phone: [phone] Fax: [phone] Website: [email or web address]

## 2024-03-19 NOTE — Progress Notes (Signed)
 Care Guide Pharmacy Note  03/19/2024 Name: Emrah Ariola MRN: [id number] DOB: 30-Jan-1951  Referred By: Marjie Skiff, NP Reason for referral: Care Coordination (Outreach to schedule with Pharm d due to med cost )   Cade Dashner is a 73 y.o. year old male who is a primary care patient of Cannady, Dorie Rank, NP.  [name] was referred to the pharmacist for assistance related to: DMII  An unsuccessful telephone outreach was attempted today to contact the patient who was referred to the pharmacy team for assistance with medication assistance. Additional attempts will be made to contact the patient.  [name] , RMA     [facility]  [facility] Guide  Direct Dial: [phone]  Website: [email or web address]

## 2024-03-27 DIAGNOSIS — H47011 Ischemic optic neuropathy, right eye: Secondary | ICD-10-CM | POA: Diagnosis not present

## 2024-03-28 ENCOUNTER — Other Ambulatory Visit

## 2024-03-28 DIAGNOSIS — D500 Iron deficiency anemia secondary to blood loss (chronic): Secondary | ICD-10-CM

## 2024-03-29 ENCOUNTER — Other Ambulatory Visit (HOSPITAL_BASED_OUTPATIENT_CLINIC_OR_DEPARTMENT_OTHER): Payer: Self-pay

## 2024-03-29 ENCOUNTER — Other Ambulatory Visit (HOSPITAL_COMMUNITY): Payer: Self-pay

## 2024-03-29 ENCOUNTER — Encounter: Payer: Self-pay | Admitting: Oncology

## 2024-03-29 ENCOUNTER — Other Ambulatory Visit: Payer: Self-pay | Admitting: Nurse Practitioner

## 2024-03-29 LAB — CBC WITH DIFFERENTIAL/PLATELET
Basophils Absolute: 0 10*3/uL (ref 0.0–0.2)
Basos: 1 %
EOS (ABSOLUTE): 0.1 10*3/uL (ref 0.0–0.4)
Eos: 2 %
Hematocrit: 38.5 % (ref 37.5–51.0)
Hemoglobin: 11.6 g/dL — ABNORMAL LOW (ref 13.0–17.7)
Immature Grans (Abs): 0 10*3/uL (ref 0.0–0.1)
Immature Granulocytes: 0 %
Lymphocytes Absolute: 1.5 10*3/uL (ref 0.7–3.1)
Lymphs: 41 %
MCH: 24.9 pg — ABNORMAL LOW (ref 26.6–33.0)
MCHC: 30.1 g/dL — ABNORMAL LOW (ref 31.5–35.7)
MCV: 83 fL (ref 79–97)
Monocytes Absolute: 0.3 10*3/uL (ref 0.1–0.9)
Monocytes: 8 %
Neutrophils Absolute: 1.8 10*3/uL (ref 1.4–7.0)
Neutrophils: 48 %
Platelets: 171 10*3/uL (ref 150–450)
RBC: 4.66 x10E6/uL (ref 4.14–5.80)
RDW: 21.1 % — ABNORMAL HIGH (ref 11.6–15.4)
WBC: 3.7 10*3/uL (ref 3.4–10.8)

## 2024-03-29 LAB — FOLATE: Folate: 14 ng/mL (ref >3.0)

## 2024-03-29 LAB — FERRITIN: Ferritin: 53 ng/mL (ref 30–400)

## 2024-03-29 LAB — IRON: Iron: 28 ug/dL — ABNORMAL LOW (ref 38–169)

## 2024-03-29 MED ORDER — IRON-VITAMIN C 65-125 MG PO TABS
1.0000 | ORAL_TABLET | Freq: Every day | ORAL | 2 refills | Status: DC
  Filled 2024-03-29: qty 60, 60d supply, fill #0

## 2024-03-29 NOTE — Progress Notes (Signed)
 [name] morning, please call Ernest [name] and let him know lab results have returned: - Hemoglobin and hematocrit levels improving, but iron level has trended down since March check.  Are you taking your iron daily? Let me know.  Please ensure you are taking daily, no missed doses.  We will recheck levels in May at your visit and if remain low we will get you into hematology.  Ferritin level is improving, so we can hold off on hematology visit until next lab check.  Any questions? Keep being stellar!!  Thank you for allowing me to participate in your care.  I appreciate you. Kindest regards, Kadisha Goodine

## 2024-03-31 ENCOUNTER — Other Ambulatory Visit: Payer: Self-pay

## 2024-04-01 ENCOUNTER — Other Ambulatory Visit (HOSPITAL_COMMUNITY): Payer: Self-pay

## 2024-04-11 ENCOUNTER — Other Ambulatory Visit: Payer: Self-pay

## 2024-04-11 NOTE — Patient Outreach (Signed)
 Complex Care Management   Visit Note  04/11/2024  Name:  Ernest [name] MRN: [id number] DOB: [birth year]  Situation: Referral received for Complex Care Management related to HTN, HLD. COPD, Diabetes, CKD, and Atrial Fibrillation I obtained verbal consent from Patient.  Visit completed with Ernest [name] via telephone.  Background:   Past Medical History:  Diagnosis Date   A-fib (HCC)    Anemia    Asthma    COPD (chronic obstructive pulmonary disease) (HCC)    Diabetes mellitus    Type II   Early Pulmonary fibrosis (HCC)    GERD (gastroesophageal reflux disease)    History of echocardiogram    a. 02/2017 Echo: EF 60-65%, no rwma, mild MR, mod dil LA. Nl RV fxn. PASP .   Hyperlipidemia    Hypertension    Morbid obesity (HCC)    Myocardial infarction [facility])    "light"   Non-obstructive CAD (coronary artery disease)    a. 2011 Cath: nonobs dzs; b. 07/2014 Cath: LM 30, LAD nl, LCX nl, RCA 20p, 42m.   Permanent atrial fibrillation (HCC)    a. CHA2DS2VASc = 4-->xarelto .   Polyp of sigmoid colon      Assessment: Patient Reported Symptoms: Cognitive Cognitive Status: Alert and oriented to person, place, and time, Normal speech and language skills Cognitive/Intellectual Conditions Management [RPT]: None reported or documented in medical history or problem list   Health Maintenance Behaviors: Annual physical exam Healing Pattern: Unsure Health Facilitated by: Rest  Neurological Neurological Review of Symptoms: No symptoms reported Neurological Conditions:  (Restless Leg Syndrome) Neurological Management Strategies: Routine screening, Medication therapy Neurological Self-Management Outcome: 4 (good)  HEENT HEENT Symptoms Reported: No symptoms reported HEENT Conditions: Vision problem(s) Vision Problems: cataract(s) (Reports Cataract Extraction) HEENT Management Strategies: Routine screening HEENT Comment: No Symptoms Reported Vision problem(s)  Cardiovascular  Cardiovascular Symptoms Reported: No symptoms reported Does patient have uncontrolled Hypertension?: No Cardiovascular Conditions: Dysrhythmia, High blood cholesterol, Hypertension, Coronary artery disease Cardiovascular Management Strategies: Adequate rest, Medical device, Medication therapy, Routine screening Cardiovascular Self-Management Outcome: 3 (uncertain)  Respiratory Respiratory Symptoms Reported: No symptoms reported Respiratory Conditions: COPD, Sleep disordered breathing Respiratory Self-Management Outcome: 4 (good)  Endocrine Patient reports the following symptoms related to hypoglycemia or hyperglycemia : No symptoms reported Is patient diabetic?: Yes Is patient checking blood sugars at home?: Yes Endocrine Conditions: Diabetes, Vitamin D  deficiency Endocrine Management Strategies: Diet modification, Medical device, Medication therapy, Routine screening Endocrine Self-Management Outcome: 3 (uncertain)  Gastrointestinal Gastrointestinal Symptoms Reported: No symptoms reported Gastrointestinal Conditions: Reflux/heartburn Gastrointestinal Management Strategies: Medication therapy, Coping strategies, Diet modification Gastrointestinal Self-Management Outcome: 4 (good) Nutrition Risk Screen (CP): No indicators present  Genitourinary Genitourinary Symptoms Reported: No symptoms reported Genitourinary Conditions: Chronic kidney disease (BPH) Genitourinary Management Strategies: Coping strategies, Medication therapy Genitourinary Self-Management Outcome: 4 (good)  Integumentary Integumentary Symptoms Reported: No symptoms reported Skin Management Strategies: Routine screening Skin Comment: No Symptoms Reported  Musculoskeletal Musculoskelatal Symptoms Reviewed: Other (Reports difficulty with prolonged standing and prolonged walking) Musculoskeletal Management Strategies: Routine screening Falls in the past year?: Yes Number of falls in past year: 2 or more Was there an injury  with Fall?: No (Reports accidental falls in the yard. Denies injuries) Fall Risk Category Calculator: 2 Patient Fall Risk Level: Moderate Fall Risk Patient at Risk for Falls Due to: History of fall(s), Medication side effect Fall risk Follow up: Falls prevention discussed  Psychosocial Psychosocial Symptoms Reported: No symptoms reported   Major Change/Loss/Stressor/Fears (CP): Denies Quality of Family Relationships: supportive Do you  feel physically threatened by others?: No      04/11/2024   11:40 AM  Depression screen PHQ 2/9  Decreased Interest 0  Down, Depressed, Hopeless 0  PHQ - 2 Score 0    Vitals:    Medications Reviewed Today     Reviewed by Roxie Cord, RN (Registered Nurse) on 04/11/24 at 1107  Med List Status: <None>   Medication Order Taking? Sig Documenting Provider Last Dose Status Informant  ACCU-CHEK GUIDE test strip [id number] No Use to check BG daily [provider] Taking Active   Accu-Chek Softclix Lancets lancets [id number] No by Other route. Use to check BG daily [provider] Taking Active   albuterol  (VENTOLIN  HFA) 108 (90 Base) MCG/ACT inhaler [id number] No Inhale 2 puffs into the lungs every 4 (four) hours as needed for wheezing or shortness of breath. Uses 2-3 times daily Cannady, Jolene T, NP Taking Active   atorvastatin  (LIPITOR ) 80 MG tablet [id number] No Take 1 tablet (80 mg total) by mouth in the morning. Lemar Pyles, NP Taking Active   Blood Glucose Monitoring Suppl (ACCU-CHEK GUIDE ME) w/Device KIT [id number] No Use to check blood sugars 2-3 times daily with goals = <130 fasting in morning and <180 two hours after eating.  Bring blood sugar log to appointments. Doran Galloway T, NP Taking Active   Budeson-Glycopyrrol-Formoterol  (BREZTRI  AEROSPHERE) 160-9-4.8 MCG/ACT AERO [id number] No Inhale 2 puffs into the lungs 2 (two) times daily. Lemar Pyles, NP Taking Active            Med Note [facility] Konor Noren N   Fri Apr 11, 2024 11:04 AM) Reports not using every day  Cholecalciferol  1.25 MG (50000 UT) TABS [id number] No Take 1 tablet by mouth once a week. Doran Galloway T, NP Taking Active   clotrimazole -betamethasone  (LOTRISONE ) cream [id number] No Apply topically 2 (two) times daily. APPLY ONE application TOPICALLY twice daily AS DIRECTED Cannady, Jolene T, NP Taking Active   dapagliflozin  propanediol (FARXIGA ) 10 MG TABS tablet [id number] No Take 1 tablet (10 mg total) by mouth daily. Cannady, Jolene T, NP Taking Active   diltiazem  (CARDIZEM  CD) 180 MG 24 hr capsule [id number] No Take 1 capsule (180 mg total) by mouth daily. Cannady, Jolene T, NP Taking Active   ezetimibe  (ZETIA ) 10 MG tablet [id number] No Take 1 tablet (10 mg total) by mouth daily. Cannady, Jolene T, NP Taking Active   furosemide  (LASIX ) 20 MG tablet [id number] No TAKE ONE TABLET BY MOUTH EVERY MORNING and TAKE ONE TABLET BY MOUTH EVERYDAY AT BEDTIME Cannady, Jolene T, NP Taking Active   gabapentin  (NEURONTIN ) 600 MG tablet [id number] No TAKE TWO TABLETS BY MOUTH EVERYDAY AT BEDTIME Cannady, Jolene T, NP Taking Active   Iron -Vitamin C  65-125 MG TABS [id number]  Take 1 tablet by mouth daily. Cannady, Jolene T, NP  Active   isosorbide  mononitrate (IMDUR ) 60 MG 24 hr tablet [id number] No TAKE ONE TABLET BY MOUTH EVERY MORNING Cannady, Jolene T, NP Taking Active   ketorolac (ACULAR) 0.5 % ophthalmic solution [id number] No Place 1 drop into the left eye 4 (four) times daily. [provider] Taking Active   metFORMIN  (GLUCOPHAGE -XR) 500 MG 24 hr tablet [id number] No Take 1 tablet (500 mg total) by mouth in the morning and at bedtime. Cannady, Jolene T, NP Taking Active   metoprolol  succinate (TOPROL -XL) 50 MG 24 hr tablet [id number] No TAKE 1 TABLET BY MOUTH ONCE DAILY WITH OR IMMEDIATELY FOLLOWING A MEAL Cannady, Jolene T, NP Taking Active  nitroGLYCERIN  (NITROSTAT ) 0.4 MG SL tablet [id number] No Place 1 tablet (0.4 mg total) under the tongue every 5 (five)  minutes as needed for chest pain. Cannady, Jolene T, NP Taking Active   pantoprazole  (PROTONIX ) 40 MG tablet [id number] No Take 1 tablet (40 mg total) by mouth daily. Doran Galloway T, NP Taking Active   prednisoLONE acetate (PRED FORTE) 1 % ophthalmic suspension [id number] No Place 1 drop into the left eye 4 (four) times daily. [provider] Taking Active   ramipril  (ALTACE ) 2.5 MG capsule [id number] No Take 1 capsule (2.5 mg total) by mouth daily. Doran Galloway T, NP Taking Active   rivaroxaban  (XARELTO ) 20 MG TABS tablet [id number] No Take 1 tablet (20 mg total) by mouth every evening. Doran Galloway T, NP Taking Active   Semaglutide ,0.25 or 0.5MG /DOS, (OZEMPIC , 0.25 OR 0.5 MG/DOSE,) 2 MG/3ML SOPN [id number] No Inject 0.5 mg into the skin once a week. Doran Galloway T, NP Taking Active            Med Note Finley Hugh, CHERYL A   Wed Aug 08, 2023  8:45 AM) PAP  trimethoprim-polymyxin b (POLYTRIM) ophthalmic solution [id number] No Place 1 drop into the left eye 4 (four) times daily. [provider] Taking Active             Recommendation:   PCP Follow-up on May 02, 2024   Follow Up Plan:   Telephone follow up appointment on May 23, 2024.   Roxie Cord [facility] Population Health RN Care Manager Direct Dial: [phone]  Fax: [phone] Website: [email or web address]

## 2024-04-11 NOTE — Patient Instructions (Addendum)
 Thank you for allowing the Complex Care Management team to participate in your care. It was great speaking with you today.    Reminders: Please attend your Cardiology appointment as scheduled on April 16, 2024. Please attend your PCP appointment as scheduled on May 02, 2024. I will message her regarding your need to reestablish care with a Pulmonologist.   Our next telephone outreach is scheduled for May 23, 2024 at 1500. Please do not hesitate to contact me if you require assistance prior to our next outreach.    Roxie Cord [facility] Population Health RN Care Manager Direct Dial: [phone]  Fax: [phone] Website: [email or web address]
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
## 2024-04-16 ENCOUNTER — Ambulatory Visit: Attending: Physician Assistant | Admitting: Physician Assistant

## 2024-04-16 ENCOUNTER — Other Ambulatory Visit: Payer: Self-pay

## 2024-04-16 ENCOUNTER — Encounter: Payer: Self-pay | Admitting: Physician Assistant

## 2024-04-16 ENCOUNTER — Other Ambulatory Visit (HOSPITAL_COMMUNITY): Payer: Self-pay

## 2024-04-16 VITALS — BP 122/60 | HR 71 | Ht 70.0 in | Wt 234.0 lb

## 2024-04-16 DIAGNOSIS — I10 Essential (primary) hypertension: Secondary | ICD-10-CM | POA: Diagnosis not present

## 2024-04-16 DIAGNOSIS — I2510 Atherosclerotic heart disease of native coronary artery without angina pectoris: Secondary | ICD-10-CM | POA: Diagnosis not present

## 2024-04-16 DIAGNOSIS — I4821 Permanent atrial fibrillation: Secondary | ICD-10-CM

## 2024-04-16 DIAGNOSIS — E785 Hyperlipidemia, unspecified: Secondary | ICD-10-CM

## 2024-04-16 MED ORDER — METOPROLOL SUCCINATE ER 25 MG PO TB24
25.0000 mg | ORAL_TABLET | Freq: Every day | ORAL | 3 refills | Status: DC
  Filled 2024-04-16: qty 90, 90d supply, fill #0

## 2024-04-16 NOTE — Patient Instructions (Signed)
 Medication Instructions:  Your physician recommends the following medication changes.  DECREASE: Metoprolol  to 25 mg once daily.   Follow-Up: At [facility], you and your health needs are our priority.  As part of our continuing mission to provide you with exceptional heart care, our providers are all part of one team.  This team includes your primary Cardiologist (physician) and Advanced Practice Providers or APPs (Physician Assistants and Nurse Practitioners) who all work together to provide you with the care you need, when you need it.  Your next appointment:   6 month(s)  Provider:   You may see Timothy Gollan, MD or one of the following Advanced Practice Providers on your designated Care Team:   Laneta Pintos, NP Gildardo Labrador, PA-C Varney Gentleman, PA-C Cadence Joiner, PA-C Ronald Cockayne, NP Morey Ar, NP    We recommend signing up for the patient portal called "MyChart".  Sign up information is provided on this After Visit Summary.  MyChart is used to connect with patients for Virtual Visits (Telemedicine).  Patients are able to view lab/test results, encounter notes, upcoming appointments, etc.  Non-urgent messages can be sent to your provider as well.   To learn more about what you can do with MyChart, go to [email or web address].

## 2024-04-27 NOTE — Patient Instructions (Signed)
Be Involved in Caring For Your Health:  Taking Medications When medications are taken as directed, they can greatly improve your health. But if they are not taken as prescribed, they may not work. In some cases, not taking them correctly can be harmful. To help ensure your treatment remains effective and safe, understand your medications and how to take them. Bring your medications to each visit for review by your provider.  Your lab results, notes, and after visit summary will be available on My Chart. We strongly encourage you to use this feature. If lab results are abnormal the clinic will contact you with the appropriate steps. If the clinic does not contact you assume the results are satisfactory. You can always view your results on My Chart. If you have questions regarding your health or results, please contact the clinic during office hours. You can also ask questions on My Chart.  We at [facility] are grateful that you chose [place] to provide your care. We strive to provide evidence-based and compassionate care and are always looking for feedback. If you get a survey from the clinic please complete this so we can hear your opinions.  Diabetes Mellitus and Exercise Regular exercise is important for your health, especially if you have diabetes mellitus. Exercise is not just about losing weight. It can also help you increase muscle strength and bone density and reduce body fat and stress. This can help your level of endurance and make you more fit and flexible. Why should I exercise if I have diabetes? Exercise has many benefits for people with diabetes. It can: Help lower and control your blood sugar (glucose). Help your body respond better and become more sensitive to the hormone insulin. Reduce how much insulin your body needs. Lower your risk for heart disease by: Lowering how much "bad" cholesterol and triglycerides you have in your body. Increasing how much "good" cholesterol  you have in your body. Lowering your blood pressure. Lowering your blood glucose levels. What is my activity plan? Your health care provider or an expert trained in diabetes care (certified diabetes educator) can help you make an activity plan. This plan can help you find the type of exercise that works for you. It may also tell you how often to exercise and for how long. Be sure to: Get at least 150 minutes of medium-intensity or high-intensity exercise each week. This may involve brisk walking, biking, or water aerobics. Do stretching and strengthening exercises at least 2 times a week. This may involve yoga or weight lifting. Spread out your activity over at least 3 days of the week. Get some form of physical activity each day. Do not go more than 2 days in a row without some kind of activity. Avoid being inactive for more than 30 minutes at a time. Take frequent breaks to walk or stretch. Choose activities that you enjoy. Set goals that you know you can accomplish. Start slowly and increase the intensity of your exercise over time. How do I manage my diabetes during exercise?  Monitor your blood glucose Check your blood glucose before and after you exercise. If your blood glucose is 240 mg/dL (40.9 mmol/L) or higher before you exercise, check your urine for ketones. These are chemicals created by the liver. If you have ketones in your urine, do not exercise until your blood glucose returns to normal. If your blood glucose is 100 mg/dL (5.6 mmol/L) or lower, eat a snack that has 15-20 grams of carbohydrate in  it. Check your blood glucose 15 minutes after the snack to make sure that your level is above 100 mg/dL (5.6 mmol/L) before you start to exercise. Your risk for low blood glucose (hypoglycemia) goes up during and after exercise. Know the symptoms of this condition and how to treat it. Follow these instructions at home: Keep a carbohydrate snack on hand for use before, during, and after  exercise. This can help prevent or treat hypoglycemia. Avoid injecting insulin into parts of your body that are going to be used during exercise. This may include: Your arms, when you are going to play tennis. Your legs, when you are about to go jogging. Keep track of your exercise habits. This can help you and your health care provider watch and adjust your activity plan. Write down: What you eat before and after you exercise. Blood glucose levels before and after you exercise. The type and amount of exercise you do. Talk to your health care provider before you start a new activity. They may need to: Make sure that the activity is safe for you. Adjust your insulin, other medicines, and food that you eat. Drink water while you exercise. This can stop you from losing too much water (dehydration). It can also prevent problems caused by having a lot of heat in your body (heat stroke). Where to find more information American Diabetes Association: diabetes.org Association of Diabetes Care & Education Specialists: diabeteseducator.org This information is not intended to replace advice given to you by your health care provider. Make sure you discuss any questions you have with your health care provider. Document Revised: 05/24/2022 Document Reviewed: 05/24/2022 Elsevier Patient Education  2024 [employer].

## 2024-04-28 NOTE — Progress Notes (Signed)
 Care Guide Pharmacy Note  04/28/2024 Name: Ernest [name] MRN: [id number] DOB: Feb 27, 1951  Referred By: Lemar Pyles, NP Reason for referral: Care Coordination (Outreach to schedule with Pharm d due to med cost )   [name] is a 73 y.o. year old male who is a primary care patient of Cannady, Jolene T, NP.  Washington Hacker was referred to the pharmacist for assistance related to: HTN and DMII  Successful contact was made with the patient to discuss pharmacy services including being ready for the pharmacist to call at least 5 minutes before the scheduled appointment time and to have medication bottles and any blood pressure readings ready for review. The patient agreed to meet with the pharmacist via telephone visit on (date/time).05/15/2024  Lenton Rail , RMA     [place]  [facility] Guide  Direct Dial: [phone]  Website: [email or web address]

## 2024-04-29 ENCOUNTER — Encounter: Payer: Self-pay | Admitting: Oncology

## 2024-05-01 ENCOUNTER — Encounter: Payer: Self-pay | Admitting: Oncology

## 2024-05-02 ENCOUNTER — Other Ambulatory Visit: Payer: Self-pay

## 2024-05-02 ENCOUNTER — Other Ambulatory Visit (HOSPITAL_COMMUNITY): Payer: Self-pay

## 2024-05-02 ENCOUNTER — Encounter: Payer: Self-pay | Admitting: Nurse Practitioner

## 2024-05-02 ENCOUNTER — Ambulatory Visit: Payer: Self-pay | Admitting: Nurse Practitioner

## 2024-05-02 ENCOUNTER — Ambulatory Visit (INDEPENDENT_AMBULATORY_CARE_PROVIDER_SITE_OTHER): Payer: Medicare HMO | Admitting: Nurse Practitioner

## 2024-05-02 ENCOUNTER — Encounter: Payer: Self-pay | Admitting: Oncology

## 2024-05-02 ENCOUNTER — Ambulatory Visit
Admission: RE | Admit: 2024-05-02 | Discharge: 2024-05-02 | Disposition: A | Discharge status: Home / Self Care | Source: Ambulatory Visit | Attending: Nurse Practitioner | Admitting: Nurse Practitioner

## 2024-05-02 DIAGNOSIS — M79662 Pain in left lower leg: Secondary | ICD-10-CM | POA: Diagnosis present

## 2024-05-02 DIAGNOSIS — I4811 Longstanding persistent atrial fibrillation: Secondary | ICD-10-CM

## 2024-05-02 DIAGNOSIS — G4733 Obstructive sleep apnea (adult) (pediatric): Secondary | ICD-10-CM

## 2024-05-02 DIAGNOSIS — E1159 Type 2 diabetes mellitus with other circulatory complications: Secondary | ICD-10-CM | POA: Diagnosis not present

## 2024-05-02 DIAGNOSIS — J439 Emphysema, unspecified: Secondary | ICD-10-CM

## 2024-05-02 DIAGNOSIS — E66811 Obesity, class 1: Secondary | ICD-10-CM

## 2024-05-02 DIAGNOSIS — Z7985 Long-term (current) use of injectable non-insulin antidiabetic drugs: Secondary | ICD-10-CM

## 2024-05-02 DIAGNOSIS — N1831 Chronic kidney disease, stage 3a: Secondary | ICD-10-CM

## 2024-05-02 DIAGNOSIS — E119 Type 2 diabetes mellitus without complications: Secondary | ICD-10-CM

## 2024-05-02 DIAGNOSIS — E6609 Other obesity due to excess calories: Secondary | ICD-10-CM

## 2024-05-02 DIAGNOSIS — D500 Iron deficiency anemia secondary to blood loss (chronic): Secondary | ICD-10-CM

## 2024-05-02 DIAGNOSIS — E1169 Type 2 diabetes mellitus with other specified complication: Secondary | ICD-10-CM | POA: Diagnosis not present

## 2024-05-02 DIAGNOSIS — I700 Atherosclerosis of aorta: Secondary | ICD-10-CM

## 2024-05-02 LAB — BAYER DCA HB A1C WAIVED: HB A1C (BAYER DCA - WAIVED): 5.6 % (ref 4.8–5.6)

## 2024-05-02 MED ORDER — IRON-VITAMIN C 65-125 MG PO TABS
1.0000 | ORAL_TABLET | Freq: Every day | ORAL | 2 refills | Status: DC
  Filled 2024-05-02: qty 60, 60d supply, fill #0

## 2024-05-02 MED ORDER — METOPROLOL SUCCINATE ER 25 MG PO TB24
25.0000 mg | ORAL_TABLET | Freq: Every day | ORAL | 3 refills | Status: DC
  Filled 2024-05-02 – 2024-07-09 (×2): qty 90, 90d supply, fill #0

## 2024-05-02 MED ORDER — OZEMPIC (0.25 OR 0.5 MG/DOSE) 2 MG/3ML ~~LOC~~ SOPN
0.5000 mg | PEN_INJECTOR | SUBCUTANEOUS | 4 refills | Status: AC
  Filled 2024-05-02 – 2024-12-01 (×2): qty 9, 84d supply, fill #0

## 2024-05-02 NOTE — Assessment & Plan Note (Signed)
 Chronic, stable.  BP remains well below goal and it is less soft then present checks.  Will continue Ramipril  2.5 MG daily, educated him on this (maintain for kidney protection), and maintain rest of regimen as is.  Recommend he monitor BP at least a few mornings a week at home and document.  DASH diet at home.  Labs today: CBC, CMP.  Return in 3 months.

## 2024-05-02 NOTE — Assessment & Plan Note (Signed)
 Chronic, ongoing.  A1c today 5.6%, major trend down with GLP1 on board and continues to lose weight. Urine ALB 80 February 2025, continue Ramipril  for kidney protection.  Check BS 2-3 times a day, he is noticing a down trend in sugar levels.  Recommend heavy focus on diabetic diet and maintain current regimen at this time.  Return in 3 months. Praised for this success.  Continue to work with [facility]. - ACE and statin on board - Foot exam up to date, recommend he schedule eye exam. - Vaccines up to date

## 2024-05-02 NOTE — Assessment & Plan Note (Signed)
 Chronic, ongoing.  Continue current medication regimen and adjust as needed. Lipid panel today.

## 2024-05-02 NOTE — Progress Notes (Signed)
 BP 129/62   Pulse (!) 57   Temp 97.7 F (36.5 C) (Oral)   Ht 5\' 10"  (1.778 m)   Wt 229 lb 9.6 oz (104.1 kg)   SpO2 99%   BMI 32.94 kg/m    Subjective:    Patient ID: Ernest [name], male    DOB: 06/08/1951, 73 y.o.   MRN: [id number]  HPI: Ernest [name] is a 73 y.o. male  Chief Complaint  Patient presents with   Atrial Fibrillation   Diabetes   Hyperlipidemia   Hypertension   DIABETES In February his A1c was 5.9%.  Taking Metformin  XR 500 MG BID, Ozempic  0.5 MG weekly (does not like Trulicity ), and Farxiga  10 MG daily.  Metformin  at higher doses caused GI issues. Has lost 5 lbs since recent visit. Takes Gabapentin  1200 MG for RLS at night taken, without it can not sleep.  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  This leg pain started off in ankle and now is up to knee and burns at times.  Rates this a 10/10 at worst.  Walking and movement makes it worse.  Resting helps, took Tylenol  which helped back and leg. 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  No swelling to leg.  Tingling all the time.  Works with [employer] [facility] for medications and assistance -- Breztri  & Farxiga .  Hypoglycemic episodes:no Polydipsia/polyuria: no Visual disturbance: no Chest pain: no Paresthesias: no Glucose Monitoring: yes  Accucheck frequency: daily varies on times he checks it  Fasting glucose:   Post prandial: 170 to 200  Evening:  Before meals: Taking Insulin ?: no  Long acting insulin :  Short acting insulin : Blood Pressure Monitoring: weekly Retinal Examination: Not Up to Date -- [employer] Foot Exam: Up to Date Pneumovax: Up to Date Influenza: Up to Date Aspirin : no   HYPERTENSION / HYPERLIPIDEMIA Takes Imdur , Lasix , Cardizem , Zetia , Atorvastatin , Ramipril , Metoprolol .  Saw cardiology last on 04/11/23, they referred him to pulmonary due to ongoing SOB with a reassuring cardiac work-up. Has not scheduled  with pulmonary yet. Has CPAP at home but does not use as wakes up with it on the floor all the time. Satisfied with current treatment? yes Duration of hypertension: chronic BP monitoring frequency: occasional BP range: <120/80 range  BP medication side effects: no Duration of hyperlipidemia: chronic Cholesterol medication side effects: no Cholesterol supplements: none Medication compliance: good compliance Aspirin : no Recent stressors: no Recurrent headaches: no Visual changes: no Palpitations: no Dyspnea: occasionally when active Chest pain: used NTG one time since last visit for one dose, 2 weeks ago Lower extremity edema: no Dizzy/lightheaded: no  ATRIAL FIBRILLATION Takes Xarelto  and Metoprolol . Atrial fibrillation status: stable Satisfied with current treatment: yes  Medication side effects:  no Medication compliance: good compliance Etiology of atrial fibrillation:  Palpitations:  occasional Chest pain:  no Dyspnea on exertion:  no Orthopnea:  no Syncope:  no Edema:  no Ventricular rate control: B-blocker Anti-coagulation: long acting   CHRONIC KIDNEY DISEASE (Stage 3a) CKD status: stable Medications renally dose: yes Previous renal evaluation: no Pneumovax:  Up to Date Influenza Vaccine:  Up to Date   COPD Continues to use Breztri  and Albuterol . Smoked for 30 years ago, smoked about 4 PPD, got hypnotized and quit. COPD status: stable Satisfied with current treatment?: yes Oxygen use: no Dyspnea frequency: no Cough frequency: no Rescue inhaler frequency:   Limitation of activity: no Productive  cough: no Last Spirometry: 07/04/22 --- FEV1/FVC >80% and FEV1 similar Pneumovax: Up to Date Influenza: Up to Date   Relevant past medical, surgical, family and social history reviewed and updated as indicated. Interim medical history since our last visit reviewed. Allergies and medications reviewed and updated.  Review of Systems  Constitutional:  Negative for  activity change, diaphoresis, fatigue and fever.  Respiratory:  Negative for cough, chest tightness, shortness of breath and wheezing.   Cardiovascular:  Negative for chest pain, palpitations and leg swelling.  Gastrointestinal: Negative.   Endocrine: Negative for cold intolerance, heat intolerance, polydipsia, polyphagia and polyuria.  Musculoskeletal:  Positive for arthralgias.  Neurological: Negative.   Psychiatric/Behavioral: Negative.     Per HPI unless specifically indicated above     Objective:    BP 129/62   Pulse (!) 57   Temp 97.7 F (36.5 C) (Oral)   Ht 5\' 10"  (1.778 m)   Wt 229 lb 9.6 oz (104.1 kg)   SpO2 99%   BMI 32.94 kg/m   Wt Readings from Last 3 Encounters:  05/02/24 229 lb 9.6 oz (104.1 kg)  04/16/24 234 lb (106.1 kg)  03/17/24 233 lb (105.7 kg)    Physical Exam Vitals and nursing note reviewed.  Constitutional:      General: He is awake. He is not in acute distress.    Appearance: He is well-developed and well-groomed. He is obese. He is not ill-appearing or toxic-appearing.  HENT:     Head: Normocephalic.     Right Ear: Hearing and external ear normal.     Left Ear: Hearing and external ear normal.  Eyes:     General: Lids are normal.     Extraocular Movements: Extraocular movements intact.     Conjunctiva/sclera: Conjunctivae normal.  Neck:     Thyroid : No thyromegaly.     Vascular: No carotid bruit.  Cardiovascular:     Rate and Rhythm: Regular rhythm. Bradycardia present.     Pulses:          Popliteal pulses are 1+ on the right side and 1+ on the left side.       Dorsalis pedis pulses are 2+ on the right side and 2+ on the left side.     Heart sounds: Normal heart sounds.     Comments: Negative Homans left leg.  Varicose veins lower legs bilaterally. Pulmonary:     Effort: Pulmonary effort is normal. No accessory muscle usage or respiratory distress.     Breath sounds: Normal breath sounds.  Abdominal:     General: Bowel sounds are  normal. There is no distension.     Palpations: Abdomen is soft.     Tenderness: There is no abdominal tenderness.  Musculoskeletal:     Cervical back: Full passive range of motion without pain.     Right lower leg: Edema (trace at sock line) present.     Left lower leg: Edema (trace at sock line) present.  Lymphadenopathy:     Cervical: No cervical adenopathy.  Skin:    General: Skin is warm.     Capillary Refill: Capillary refill takes less than 2 seconds.  Neurological:     Mental Status: He is alert and oriented to person, place, and time.     Deep Tendon Reflexes: Reflexes are normal and symmetric.     Reflex Scores:      Brachioradialis reflexes are 2+ on the right side and 2+ on the left side.  Patellar reflexes are 2+ on the right side and 2+ on the left side. Psychiatric:        Attention and Perception: Attention normal.        Mood and Affect: Mood normal.        Speech: Speech normal.        Behavior: Behavior normal. Behavior is cooperative.        Thought Content: Thought content normal.    Diabetic Foot Exam - Simple   Simple Foot Form Visual Inspection See comments: Yes Sensation Testing See comments: Yes Pulse Check See comments: Yes Comments Dry skin and thick toenails to both feet.  Pulses both feet DP 2+ and PT 1+.  Sensation left 8/10 and right 6/10.     Results for orders placed or performed in visit on 03/28/24  CBC with Differential/Platelet   Collection Time: 03/28/24  1:35 PM  Result Value Ref Range   WBC 3.7 3.4 - 10.8 x10E3/uL   RBC 4.66 4.14 - 5.80 x10E6/uL   Hemoglobin 11.6 (L) 13.0 - 17.7 g/dL   Hematocrit 56.2 13.0 - 51.0 %   MCV 83 79 - 97 fL   MCH 24.9 (L) 26.6 - 33.0 pg   MCHC 30.1 (L) 31.5 - 35.7 g/dL   RDW 86.5 (H) 78.4 - 69.6 %   Platelets 171 150 - 450 x10E3/uL   Neutrophils 48 Not Estab. %   Lymphs 41 Not Estab. %   Monocytes 8 Not Estab. %   Eos 2 Not Estab. %   Basos 1 Not Estab. %   Neutrophils Absolute 1.8 1.4 -  7.0 x10E3/uL   Lymphocytes Absolute 1.5 0.7 - 3.1 x10E3/uL   Monocytes Absolute 0.3 0.1 - 0.9 x10E3/uL   EOS (ABSOLUTE) 0.1 0.0 - 0.4 x10E3/uL   Basophils Absolute 0.0 0.0 - 0.2 x10E3/uL   Immature Granulocytes 0 Not Estab. %   Immature Grans (Abs) 0.0 0.0 - 0.1 x10E3/uL  Iron    Collection Time: 03/28/24  1:35 PM  Result Value Ref Range   Iron  28 (L) 38 - 169 ug/dL  Ferritin   Collection Time: 03/28/24  1:35 PM  Result Value Ref Range   Ferritin 53 30 - 400 ng/mL  Folate   Collection Time: 03/28/24  1:35 PM  Result Value Ref Range   Folate 14.0 >3.0 ng/mL      Assessment & Plan:   Problem List Items Addressed This Visit       Cardiovascular and Mediastinum   Hypertension associated with diabetes (HCC)   Chronic, stable.  BP remains well below goal and it is less soft then present checks.  Will continue Ramipril  2.5 MG daily, educated him on this (maintain for kidney protection), and maintain rest of regimen as is.  Recommend he monitor BP at least a few mornings a week at home and document.  DASH diet at home.  Labs today: CBC, CMP.  Return in 3 months.       Relevant Medications   Semaglutide ,0.25 or 0.5MG /DOS, (OZEMPIC , 0.25 OR 0.5 MG/DOSE,) 2 MG/3ML SOPN   metoprolol  succinate (TOPROL -XL) 25 MG 24 hr tablet   Other Relevant Orders   Bayer DCA Hb A1c Waived   Calcification of abdominal aorta (HCC)   Noted on CT February 2018, continue statin therapy as ordered.      Relevant Medications   metoprolol  succinate (TOPROL -XL) 25 MG 24 hr tablet   Atrial fibrillation (HCC)   Chronic, ongoing.  Rate controlled.  Continue collaboration with cardiology  and current medication regimen as ordered by them.  Recent note reviewed. Recommend he schedule follow-up with cardiology.      Relevant Medications   metoprolol  succinate (TOPROL -XL) 25 MG 24 hr tablet   Aortic atherosclerosis (HCC)   Chronic, ongoing.  Noted on imaging 03/05/18.  Continue statin therapy and Eliquis for  prevention.      Relevant Medications   metoprolol  succinate (TOPROL -XL) 25 MG 24 hr tablet     Respiratory   COPD (chronic obstructive pulmonary disease) with emphysema (HCC)   Chronic, ongoing.  History of smoking.  Levels >80%.  Continue current inhaler regimen and adjust as needed.  Recommend he consistently use his Breztri  and not miss doses as may benefit his SOB, educated him on this.  Recommend he reschedule with pulmonary for further testing due to SOB.        Endocrine   Type 2 diabetes mellitus with morbid obesity (HCC) - Primary   Chronic, ongoing.  A1c today 5.6%, major trend down with GLP1 on board and continues to lose weight. Urine ALB 80 February 2025, continue Ramipril  for kidney protection.  Check BS 2-3 times a day, he is noticing a down trend in sugar levels.  Recommend heavy focus on diabetic diet and maintain current regimen at this time.  Return in 3 months. Praised for this success.  Continue to work with [facility]. - ACE and statin on board - Foot exam up to date, recommend he schedule eye exam. - Vaccines up to date      Relevant Medications   Semaglutide ,0.25 or 0.5MG /DOS, (OZEMPIC , 0.25 OR 0.5 MG/DOSE,) 2 MG/3ML SOPN   Other Relevant Orders   Bayer DCA Hb A1c Waived   Hyperlipidemia associated with type 2 diabetes mellitus (HCC)   Chronic, ongoing.  Continue current medication regimen and adjust as needed.  Lipid panel today.         Relevant Medications   Semaglutide ,0.25 or 0.5MG /DOS, (OZEMPIC , 0.25 OR 0.5 MG/DOSE,) 2 MG/3ML SOPN   metoprolol  succinate (TOPROL -XL) 25 MG 24 hr tablet   Other Relevant Orders   Bayer DCA Hb A1c Waived   Lipid Panel w/o Chol/HDL Ratio   Comprehensive metabolic panel with GFR   Diabetes mellitus treated with injections of non-insulin  medication (HCC)   Refer to diabetes with obesity plan of care.      Relevant Medications   Semaglutide ,0.25 or 0.5MG /DOS, (OZEMPIC , 0.25 OR 0.5 MG/DOSE,) 2 MG/3ML SOPN   Other  Relevant Orders   Bayer DCA Hb A1c Waived     Genitourinary   Stage 3a chronic kidney disease (CKD) (HCC)   Chronic, ongoing - recent labs normal range.  At this time continue Ramipril  for kidney protection.  CMP today.        Relevant Orders   CBC with Differential/Platelet   Comprehensive metabolic panel with GFR     Other   Pain of left lower leg   Ongoing for 2 months.  Will obtain imaging of lower leg to ensure no DVT, low suspicion for this but based on symptoms would be beneficial to further assess.  If imaging normal consider referral to ortho as pain may be coming from hip or lower back.      Relevant Orders   US  Venous Img Lower Unilateral Left (DVT)   Obesity   BMI 32.94 with underlying T2DM, HTN/HLD.  Recommended eating smaller high protein, low fat meals more frequently and exercising 30 mins a day 5 times a week with a goal  of 10-15lb weight loss in the next 3 months. Patient voiced their understanding and motivation to adhere to these recommendations.       Relevant Medications   Semaglutide ,0.25 or 0.5MG /DOS, (OZEMPIC , 0.25 OR 0.5 MG/DOSE,) 2 MG/3ML SOPN   Iron  deficiency anemia due to chronic blood loss   Chronic with intermittent episodes of low levels presenting, has received infusions in past and had GI work-up. Continue supplement.  Recheck labs today as has been on supplement for >6 weeks.  Goal is to avoid infusions if possible, but he will return if needed to see hematology.  Denies any recent bleeding.      Relevant Medications   Iron -Vitamin C  65-125 MG TABS   Other Relevant Orders   CBC with Differential/Platelet   Ferritin   Iron      Follow up plan: Return in about 3 months (around 08/02/2024) for T2DM, HTN/HLD, A-FIB, CKD.

## 2024-05-02 NOTE — Assessment & Plan Note (Signed)
 Refer to diabetes with obesity plan of care.

## 2024-05-02 NOTE — Assessment & Plan Note (Signed)
 Chronic with intermittent episodes of low levels presenting, has received infusions in past and had GI work-up. Continue supplement.  Recheck labs today as has been on supplement for >6 weeks.  Goal is to avoid infusions if possible, but he will return if needed to see hematology.  Denies any recent bleeding.

## 2024-05-02 NOTE — Assessment & Plan Note (Signed)
Chronic, ongoing.  Noted on imaging 03/05/18.  Continue statin therapy and Eliquis for prevention.

## 2024-05-02 NOTE — Assessment & Plan Note (Signed)
Noted on CT February 2018, continue statin therapy as ordered.

## 2024-05-02 NOTE — Assessment & Plan Note (Signed)
 BMI 32.94 with underlying T2DM, HTN/HLD.  Recommended eating smaller high protein, low fat meals more frequently and exercising 30 mins a day 5 times a week with a goal of 10-15lb weight loss in the next 3 months. Patient voiced their understanding and motivation to adhere to these recommendations.

## 2024-05-02 NOTE — Assessment & Plan Note (Signed)
Chronic, ongoing.  History of smoking.  Levels >80%.  Continue current inhaler regimen and adjust as needed.  Recommend he consistently use his Breztri and not miss doses as may benefit his SOB, educated him on this.  Recommend he reschedule with pulmonary for further testing due to SOB.

## 2024-05-02 NOTE — Assessment & Plan Note (Signed)
 Chronic, ongoing.  Rate controlled.  Continue collaboration with cardiology and current medication regimen as ordered by them.  Recent note reviewed. Recommend he schedule follow-up with cardiology.

## 2024-05-02 NOTE — Assessment & Plan Note (Signed)
Chronic, ongoing - recent labs normal range.  At this time continue Ramipril for kidney protection.  CMP today.

## 2024-05-02 NOTE — Assessment & Plan Note (Signed)
 Ongoing for 2 months.  Will obtain imaging of lower leg to ensure no DVT, low suspicion for this but based on symptoms would be beneficial to further assess.  If imaging normal consider referral to ortho as pain may be coming from hip or lower back.

## 2024-05-03 LAB — COMPREHENSIVE METABOLIC PANEL WITH GFR
ALT: 25 IU/L (ref 0–44)
AST: 24 IU/L (ref 0–40)
Albumin: 4.7 g/dL (ref 3.8–4.8)
Alkaline Phosphatase: 97 IU/L (ref 44–121)
BUN/Creatinine Ratio: 17 (ref 10–24)
BUN: 20 mg/dL (ref 8–27)
Bilirubin Total: 1.4 mg/dL — ABNORMAL HIGH (ref 0.0–1.2)
CO2: 25 mmol/L (ref 20–29)
Calcium: 10.2 mg/dL (ref 8.6–10.2)
Chloride: 100 mmol/L (ref 96–106)
Creatinine, Ser: 1.19 mg/dL (ref 0.76–1.27)
Globulin, Total: 2.2 g/dL (ref 1.5–4.5)
Glucose: 96 mg/dL (ref 70–99)
Potassium: 4 mmol/L (ref 3.5–5.2)
Sodium: 140 mmol/L (ref 134–144)
Total Protein: 6.9 g/dL (ref 6.0–8.5)
eGFR: 64 mL/min/{1.73_m2} (ref >59)

## 2024-05-03 LAB — CBC WITH DIFFERENTIAL/PLATELET
Basophils Absolute: 0 10*3/uL (ref 0.0–0.2)
Basos: 1 %
EOS (ABSOLUTE): 0.1 10*3/uL (ref 0.0–0.4)
Eos: 3 %
Hematocrit: 45.1 % (ref 37.5–51.0)
Hemoglobin: 13.9 g/dL (ref 13.0–17.7)
Immature Grans (Abs): 0 10*3/uL (ref 0.0–0.1)
Immature Granulocytes: 0 %
Lymphocytes Absolute: 1.7 10*3/uL (ref 0.7–3.1)
Lymphs: 36 %
MCH: 26.3 pg — ABNORMAL LOW (ref 26.6–33.0)
MCHC: 30.8 g/dL — ABNORMAL LOW (ref 31.5–35.7)
MCV: 85 fL (ref 79–97)
Monocytes Absolute: 0.4 10*3/uL (ref 0.1–0.9)
Monocytes: 8 %
Neutrophils Absolute: 2.5 10*3/uL (ref 1.4–7.0)
Neutrophils: 52 %
Platelets: 158 10*3/uL (ref 150–450)
RBC: 5.28 x10E6/uL (ref 4.14–5.80)
RDW: 16.2 % — ABNORMAL HIGH (ref 11.6–15.4)
WBC: 4.7 10*3/uL (ref 3.4–10.8)

## 2024-05-03 LAB — LIPID PANEL W/O CHOL/HDL RATIO
Cholesterol, Total: 71 mg/dL — ABNORMAL LOW (ref 100–199)
HDL: 28 mg/dL — ABNORMAL LOW (ref >39)
LDL Chol Calc (NIH): 20 mg/dL (ref 0–99)
Triglycerides: 125 mg/dL (ref 0–149)
VLDL Cholesterol Cal: 23 mg/dL (ref 5–40)

## 2024-05-03 LAB — IRON: Iron: 47 ug/dL (ref 38–169)

## 2024-05-03 LAB — FERRITIN: Ferritin: 64 ng/mL (ref 30–400)

## 2024-05-03 NOTE — Progress Notes (Signed)
 [name] morning, please let Ernest [name] know his labs have returned and overall are stable.  Anemia is improving.  I want him to continue his iron  supplement daily.  No medication changes needed.  Any questions? Keep being amazing!!  Thank you for allowing me to participate in your care.  I appreciate you. Kindest regards, Davaun Quintela

## 2024-05-06 ENCOUNTER — Other Ambulatory Visit: Payer: Self-pay

## 2024-05-06 ENCOUNTER — Other Ambulatory Visit (HOSPITAL_COMMUNITY): Payer: Self-pay

## 2024-05-06 ENCOUNTER — Encounter: Payer: Self-pay | Admitting: Oncology

## 2024-05-09 ENCOUNTER — Other Ambulatory Visit (HOSPITAL_COMMUNITY): Payer: Self-pay

## 2024-05-15 ENCOUNTER — Other Ambulatory Visit: Payer: Self-pay

## 2024-05-15 NOTE — Progress Notes (Signed)
 05/15/2024 Name: Ernest [name] MRN: [id number] DOB: [birth year]  Chief Complaint  Patient presents with   Medication Assistance   Ernest [name] is a 73 y.o. year old male who presented for a telephone visit.   They were referred to the pharmacist by their PCP for assistance in managing medication access.   Subjective:  Care Team: Primary Care Provider: Lemar Pyles, NP ; Next Scheduled Visit: 08/04/2024  Medication Access/Adherence  Current Pharmacy:  [facility] - [facility] [address] [place] [address] Phone: [phone] Fax: [phone]  [facility] [place], [place] - [address] [place] [address] Phone: [phone] Fax: [phone]  -Patient reports affordability concerns with their medications: No  -Patient reports access/transportation concerns to their pharmacy: No  -Patient reports adherence concerns with their medications:  No    Diabetes: Current medications: Farxiga  10mg  daily, Ozempic  0.5mg  weekly -Patient does monitor home BG on occasion but does not provide values -A1c 2 weeks ago 5.6% -Patient denies hypoglycemic s/sx including dizziness, shakiness, sweating.  -Patient denies hyperglycemic symptoms including polyuria, polydipsia, polyphagia, nocturia, neuropathy, blurred vision. -Current medication access support: Currently enrolled in [employer] PAP for Ozempic  0.5mg  and endorses receiving medication regularly through program.  Also enrolled in AZ&Me PAP for Farxiga  10mg  daily and endorses receiving medication regularly through program.  Hypertension: Current medications: diltizaem CD 180mg  daily, furosemide  20mg  BID, isosorbide  MN ER 60, metoprolol  XL 25mg  daily, nitroglycerin  PRN, ramipril  2.5mg  daily -Patient does not  monitor home BP regularly -Last office BP reading was 129/62 -Patient denies hypotensive s/sx including dizziness, lightheadedness.  -Patient denies hypertensive  symptoms including headache, chest pain, shortness of breath  Hyperlipidemia/ASCVD Risk Reduction Current lipid lowering medications: atorvastatin  80mg  daily, ezetimibe  10mg  daily  ASCVD History: PMH of HI, CAD Family History:  Mother- heart disease Risk Factors:  former smoker, HTN, Diabetes  PREVENT Risk Score: 10 year risk of CVD: 36% - 10 year risk of ASCVD: 20.2% - 10 year risk of HF: 31.4%  Atrial Fibrillation: Current medications: Xarelto  20mg  daily, metoprolol  XL 25mg  daily, diltiazem  CD 180mg  daily -Patient states he was able to change insurance plans to help with affordability of Xarelto , and his copay is now approximately $41/month, which is affordable  COPD: Current medications: Breztri  2 puffs BID, albuterol  rescue inhaler  -Reports 0 exacerbations in the past year -Discussed importance of using Breztri  daily as maintenance inhaler to help prevent exacerbations -Current medication access support: enrolled in AZ&Me PAP for Breztri  and endorses receiving medication regularly through program.  Objective:  Lab Results  Component Value Date   HGBA1C 5.6 05/02/2024   Lab Results  Component Value Date   CREATININE 1.19 05/02/2024   BUN 20 05/02/2024   NA 140 05/02/2024   K 4.0 05/02/2024   CL 100 05/02/2024   CO2 25 05/02/2024   Lab Results  Component Value Date   CHOL 71 (L) 05/02/2024   HDL 28 (L) 05/02/2024   LDLCALC 20 05/02/2024   TRIG 125 05/02/2024   CHOLHDL 2.6 04/03/2022   Medications Reviewed Today     Reviewed by Linn Rich, RPH (Pharmacist) on 05/15/24 at 1107  Med List Status: <None>   Medication Order Taking? Sig Documenting Provider Last Dose Status Informant  ACCU-CHEK GUIDE test strip [id number] Yes Use to check BG daily [provider] Taking Active   Accu-Chek Softclix Lancets lancets [id number] Yes by Other route. Use to check BG  daily [provider] Taking Active   albuterol  (VENTOLIN  HFA) 108 (90 Base) MCG/ACT  inhaler [id number] Yes Inhale 2 puffs into the lungs every 4 (four) hours as needed for wheezing or shortness of breath. Uses 2-3 times daily Cannady, Jolene T, NP Taking Active   atorvastatin  (LIPITOR ) 80 MG tablet [id number] Yes Take 1 tablet (80 mg total) by mouth in the morning. Doran Galloway T, NP Taking Active   Blood Glucose Monitoring Suppl (ACCU-CHEK GUIDE ME) w/Device KIT [id number] Yes Use to check blood sugars 2-3 times daily with goals = <130 fasting in morning and <180 two hours after eating.  Bring blood sugar log to appointments. Doran Galloway T, NP Taking Active   Budeson-Glycopyrrol-Formoterol  (BREZTRI  AEROSPHERE) 160-9-4.8 MCG/ACT AERO [id number] Yes Inhale 2 puffs into the lungs 2 (two) times daily.  Patient taking differently: Inhale 2 puffs into the lungs as needed (sob).   Lemar Pyles, NP Taking Active            Med Note (BRIDGES, JACQUELINE L   Wed Apr 16, 2024  2:34 PM)    Cholecalciferol  1.25 MG (50000 UT) TABS [id number]  Take 1 tablet by mouth once a week. Cannady, Jolene T, NP  Active   clotrimazole -betamethasone  (LOTRISONE ) cream [id number]  Apply topically 2 (two) times daily. APPLY ONE application TOPICALLY twice daily AS DIRECTED Cannady, Jolene T, NP  Active   dapagliflozin  propanediol (FARXIGA ) 10 MG TABS tablet [id number] Yes Take 1 tablet (10 mg total) by mouth daily. Cannady, Jolene T, NP Taking Active   diltiazem  (CARDIZEM  CD) 180 MG 24 hr capsule [id number] Yes Take 1 capsule (180 mg total) by mouth daily. Cannady, Jolene T, NP Taking Active   ezetimibe  (ZETIA ) 10 MG tablet [id number] Yes Take 1 tablet (10 mg total) by mouth daily. Doran Galloway T, NP Taking Active   furosemide  (LASIX ) 20 MG tablet [id number] Yes TAKE ONE TABLET BY MOUTH EVERY MORNING and TAKE ONE TABLET BY MOUTH EVERYDAY AT BEDTIME Cannady, Jolene T, NP Taking Active   gabapentin  (NEURONTIN ) 600 MG tablet [id number] Yes TAKE TWO TABLETS BY MOUTH EVERYDAY AT BEDTIME Cannady, Jolene T, NP Taking  Active   Iron -Vitamin C  65-125 MG TABS [id number]  Take 1 tablet by mouth daily. Cannady, Jolene T, NP  Active   isosorbide  mononitrate (IMDUR ) 60 MG 24 hr tablet [id number] Yes TAKE ONE TABLET BY MOUTH EVERY MORNING Cannady, Jolene T, NP Taking Active   ketorolac (ACULAR) 0.5 % ophthalmic solution [id number]  Place 1 drop into the left eye 4 (four) times daily. [provider]  Active   metFORMIN  (GLUCOPHAGE -XR) 500 MG 24 hr tablet [id number] Yes Take 1 tablet (500 mg total) by mouth in the morning and at bedtime. Cannady, Jolene T, NP Taking Active   metoprolol  succinate (TOPROL -XL) 25 MG 24 hr tablet [id number] Yes Take 1 tablet (25 mg total) by mouth daily. Doran Galloway T, NP Taking Active   nitroGLYCERIN  (NITROSTAT ) 0.4 MG SL tablet [id number] Yes Place 1 tablet (0.4 mg total) under the tongue every 5 (five) minutes as needed for chest pain. Cannady, Jolene T, NP Taking Active   pantoprazole  (PROTONIX ) 40 MG tablet [id number] Yes Take 1 tablet (40 mg total) by mouth daily. Cannady, Jolene T, NP Taking Active   ramipril  (ALTACE ) 2.5 MG capsule [id number] Yes Take 1 capsule (2.5 mg total) by mouth daily. Cannady, Jolene T, NP Taking Active   rivaroxaban  (XARELTO ) 20 MG TABS tablet [id number] Yes Take 1 tablet (20 mg total) by mouth  every evening. Doran Galloway T, NP Taking Active   Semaglutide ,0.25 or 0.5MG /DOS, (OZEMPIC , 0.25 OR 0.5 MG/DOSE,) 2 MG/3ML SOPN [id number] Yes Inject 0.5 mg into the skin once a week. Lemar Pyles, NP Taking Active            Med Note Finley Hugh, Catrinia Racicot A   Thu May 15, 2024 11:04 AM) PAP           Assessment/Plan:   Diabetes: -Currently controlled -Continue current regimen and regular follow-up with PCP and PharmD  Hypertension: -Currently controlled -Continue current regimen and regular follow-up with PCP and PharmD  Hyperlipidemia/ASCVD Risk Reduction: -Currently controlled.  -Continue current regimen and regular follow-up with PCP and  PharmD  Atrial Fibrillation: -Currently controlled -Continue current regimen and regular follow-up with PCP and PharmD  COPD: -Currently controlled.  -Continue current regimen and regular follow-up with PCP and PharmD  Follow Up Plan: Telephone visit in December to make sure PAP renewals are being worked on for AZ&Me and [employer] PAP for 2026  Linn Rich, PharmD, DPLA

## 2024-05-23 ENCOUNTER — Telehealth: Payer: Self-pay

## 2024-05-23 ENCOUNTER — Telehealth

## 2024-05-28 ENCOUNTER — Other Ambulatory Visit: Payer: Self-pay

## 2024-05-28 ENCOUNTER — Other Ambulatory Visit (HOSPITAL_COMMUNITY): Payer: Self-pay

## 2024-05-29 ENCOUNTER — Other Ambulatory Visit: Payer: Self-pay

## 2024-05-30 ENCOUNTER — Other Ambulatory Visit: Payer: Self-pay

## 2024-06-05 ENCOUNTER — Other Ambulatory Visit: Payer: Self-pay

## 2024-06-05 NOTE — Patient Instructions (Signed)
 Thank you for allowing the Chronic Care Management team to participate in your care. It was great speaking with you.   We will follow up on July 15, 2024 at 3:30pm. Please do not hesitate to contact me if you require assistance prior to the next outreach.   , Roxie Cord [facility] RN Care Manager Direct Dial: [phone]  Fax: [phone] Website: [email or web address]

## 2024-06-05 NOTE — Patient Outreach (Signed)
 Complex Care Management   Visit Note  06/05/2024  Name:  Ernest [name] MRN: [id number] DOB: [birth year]  Situation: Referral received for Complex Care Management related to COPD, Atrial Fibrillation, and Diabetes I obtained verbal consent from Patient.  Visit completed with Ernest [name] via telephone.  Background:   Past Medical History:  Diagnosis Date   A-fib (HCC)    Anemia    Asthma    COPD (chronic obstructive pulmonary disease) (HCC)    Diabetes mellitus    Type II   Early Pulmonary fibrosis (HCC)    GERD (gastroesophageal reflux disease)    History of echocardiogram    a. 02/2017 Echo: EF 60-65%, no rwma, mild MR, mod dil LA. Nl RV fxn. PASP .   Hyperlipidemia    Hypertension    Morbid obesity (HCC)    Myocardial infarction (HCC)    light   Non-obstructive CAD (coronary artery disease)    a. 2011 Cath: nonobs dzs; b. 07/2014 Cath: LM 30, LAD nl, LCX nl, RCA 20p, 21m.   Permanent atrial fibrillation (HCC)    a. CHA2DS2VASc = 4-->xarelto .   Polyp of sigmoid colon     Assessment: Patient Reported Symptoms: Cognitive Cognitive Status: Alert and oriented to person, place, and time, Normal speech and language skills Cognitive/Intellectual Conditions Management [RPT]: None reported or documented in medical history or problem list Health Maintenance Behaviors: Annual physical exam Healing Pattern: Unsure Health Facilitated by: Rest  Neurological Neurological Review of Symptoms: No symptoms reported  HEENT HEENT Symptoms Reported: No symptoms reported  Cardiovascular Cardiovascular Symptoms Reported: No symptoms reported  Respiratory Respiratory Symptoms Reported: No symptoms reported  Endocrine Patient reports the following symptoms related to hypoglycemia or hyperglycemia : No symptoms reported Is patient diabetic?: Yes Is patient checking blood sugars at home?: Yes  Gastrointestinal Gastrointestinal Symptoms Reported: No symptoms reported  Genitourinary  Genitourinary Symptoms Reported: No symptoms reported  Integumentary Integumentary Symptoms Reported: No symptoms reported  Musculoskeletal Musculoskelatal Symptoms Reviewed: Other Other Musculoskeletal Symptoms: Reports using device with prolonged walking or prolonged standing  Psychosocial Psychosocial Symptoms Reported: No symptoms reported      06/05/2024    4:11 PM  Depression screen PHQ 2/9  Decreased Interest 0  Down, Depressed, Hopeless 0  PHQ - 2 Score 0   There were no vitals filed for this visit.  Outpatient Encounter Medications as of 06/05/2024  Medication Sig Note   ACCU-CHEK GUIDE test strip Use to check BG daily    Accu-Chek Softclix Lancets lancets by Other route. Use to check BG daily    albuterol  (VENTOLIN  HFA) 108 (90 Base) MCG/ACT inhaler Inhale 2 puffs into the lungs every 4 (four) hours as needed for wheezing or shortness of breath. Uses 2-3 times daily    atorvastatin  (LIPITOR ) 80 MG tablet Take 1 tablet (80 mg total) by mouth in the morning.    Blood Glucose Monitoring Suppl (ACCU-CHEK GUIDE ME) w/Device KIT Use to check blood sugars 2-3 times daily with goals = <130 fasting in morning and <180 two hours after eating.  Bring blood sugar log to appointments.    Budeson-Glycopyrrol-Formoterol  (BREZTRI  AEROSPHERE) 160-9-4.8 MCG/ACT AERO Inhale 2 puffs into the lungs 2 (two) times daily. (Patient taking differently: Inhale 2 puffs into the lungs as needed (sob).)    Cholecalciferol  1.25 MG (50000 UT) TABS Take 1 tablet by mouth once a week.    clotrimazole -betamethasone  (LOTRISONE ) cream Apply topically 2 (two) times daily. APPLY ONE application TOPICALLY twice daily AS DIRECTED  dapagliflozin  propanediol (FARXIGA ) 10 MG TABS tablet Take 1 tablet (10 mg total) by mouth daily.    diltiazem  (CARDIZEM  CD) 180 MG 24 hr capsule Take 1 capsule (180 mg total) by mouth daily.    ezetimibe  (ZETIA ) 10 MG tablet Take 1 tablet (10 mg total) by mouth daily.    furosemide  (LASIX )  20 MG tablet TAKE ONE TABLET BY MOUTH EVERY MORNING and TAKE ONE TABLET BY MOUTH EVERYDAY AT BEDTIME    gabapentin  (NEURONTIN ) 600 MG tablet TAKE TWO TABLETS BY MOUTH EVERYDAY AT BEDTIME    Iron -Vitamin C  65-125 MG TABS Take 1 tablet by mouth daily.    isosorbide  mononitrate (IMDUR ) 60 MG 24 hr tablet TAKE ONE TABLET BY MOUTH EVERY MORNING    ketorolac (ACULAR) 0.5 % ophthalmic solution Place 1 drop into the left eye 4 (four) times daily.    metFORMIN  (GLUCOPHAGE -XR) 500 MG 24 hr tablet Take 1 tablet (500 mg total) by mouth in the morning and at bedtime.    metoprolol  succinate (TOPROL -XL) 25 MG 24 hr tablet Take 1 tablet (25 mg total) by mouth daily.    nitroGLYCERIN  (NITROSTAT ) 0.4 MG SL tablet Place 1 tablet (0.4 mg total) under the tongue every 5 (five) minutes as needed for chest pain.    pantoprazole  (PROTONIX ) 40 MG tablet Take 1 tablet (40 mg total) by mouth daily.    ramipril  (ALTACE ) 2.5 MG capsule Take 1 capsule (2.5 mg total) by mouth daily.    rivaroxaban  (XARELTO ) 20 MG TABS tablet Take 1 tablet (20 mg total) by mouth every evening.    Semaglutide ,0.25 or 0.5MG /DOS, (OZEMPIC , 0.25 OR 0.5 MG/DOSE,) 2 MG/3ML SOPN Inject 0.5 mg into the skin once a week. 05/15/2024: PAP   No facility-administered encounter medications on file as of 06/05/2024.    Recommendation:   Continue Current Plan of Care  Follow Up Plan:   Telephone follow up appointment with Nurse Case Manager on July 15, 2024    Roxie Cord [facility] RN Care Manager Direct Dial: [phone]  Fax: [phone] Website: [email or web address]

## 2024-06-23 ENCOUNTER — Telehealth: Payer: Self-pay

## 2024-06-23 NOTE — Telephone Encounter (Signed)
 Called patient to inform that their shipment through PAP (Patient Assistance Program) has been received in office and is available for pickup Monday-Friday 8am - 5pm (lunch 12:15pm-12:45pm office closed).  Patient aware to receive they will need to sign and date the pick up paperwork at the front desk folder.   Company: [employer] Medication received: Ozempic  How many units: 4 boxes

## 2024-07-09 ENCOUNTER — Other Ambulatory Visit (HOSPITAL_COMMUNITY): Payer: Self-pay

## 2024-07-09 ENCOUNTER — Other Ambulatory Visit: Payer: Self-pay

## 2024-07-15 ENCOUNTER — Telehealth: Payer: Self-pay

## 2024-08-02 NOTE — Patient Instructions (Signed)

## 2024-08-04 ENCOUNTER — Other Ambulatory Visit (HOSPITAL_COMMUNITY): Payer: Self-pay

## 2024-08-04 ENCOUNTER — Ambulatory Visit (INDEPENDENT_AMBULATORY_CARE_PROVIDER_SITE_OTHER): Admitting: Nurse Practitioner

## 2024-08-04 ENCOUNTER — Encounter: Payer: Self-pay | Admitting: Nurse Practitioner

## 2024-08-04 DIAGNOSIS — G2581 Restless legs syndrome: Secondary | ICD-10-CM

## 2024-08-04 DIAGNOSIS — Z7985 Long-term (current) use of injectable non-insulin antidiabetic drugs: Secondary | ICD-10-CM

## 2024-08-04 DIAGNOSIS — E1169 Type 2 diabetes mellitus with other specified complication: Secondary | ICD-10-CM

## 2024-08-04 DIAGNOSIS — J439 Emphysema, unspecified: Secondary | ICD-10-CM

## 2024-08-04 DIAGNOSIS — D500 Iron deficiency anemia secondary to blood loss (chronic): Secondary | ICD-10-CM

## 2024-08-04 DIAGNOSIS — N1831 Chronic kidney disease, stage 3a: Secondary | ICD-10-CM | POA: Diagnosis not present

## 2024-08-04 DIAGNOSIS — I4811 Longstanding persistent atrial fibrillation: Secondary | ICD-10-CM

## 2024-08-04 DIAGNOSIS — E66811 Obesity, class 1: Secondary | ICD-10-CM

## 2024-08-04 DIAGNOSIS — E1159 Type 2 diabetes mellitus with other circulatory complications: Secondary | ICD-10-CM | POA: Diagnosis not present

## 2024-08-04 DIAGNOSIS — E785 Hyperlipidemia, unspecified: Secondary | ICD-10-CM | POA: Diagnosis not present

## 2024-08-04 DIAGNOSIS — I700 Atherosclerosis of aorta: Secondary | ICD-10-CM

## 2024-08-04 DIAGNOSIS — E6609 Other obesity due to excess calories: Secondary | ICD-10-CM

## 2024-08-04 DIAGNOSIS — E119 Type 2 diabetes mellitus without complications: Secondary | ICD-10-CM

## 2024-08-04 LAB — BAYER DCA HB A1C WAIVED: HB A1C (BAYER DCA - WAIVED): 6.3 % — ABNORMAL HIGH (ref 4.8–5.6)

## 2024-08-04 MED ORDER — PANTOPRAZOLE SODIUM 40 MG PO TBEC
40.0000 mg | DELAYED_RELEASE_TABLET | Freq: Every day | ORAL | 4 refills | Status: AC
  Filled 2024-08-04: qty 90, 90d supply, fill #0
  Filled 2024-11-17: qty 90, 90d supply, fill #1

## 2024-08-04 MED ORDER — RAMIPRIL 2.5 MG PO CAPS
2.5000 mg | ORAL_CAPSULE | Freq: Every day | ORAL | 4 refills | Status: AC
  Filled 2024-08-04: qty 90, 90d supply, fill #0
  Filled 2024-11-17: qty 90, 90d supply, fill #1

## 2024-08-04 MED ORDER — GABAPENTIN 600 MG PO TABS
ORAL_TABLET | ORAL | 4 refills | Status: AC
  Filled 2024-08-04: qty 180, 90d supply, fill #0
  Filled 2024-11-17: qty 180, 90d supply, fill #1

## 2024-08-04 MED ORDER — ALBUTEROL SULFATE HFA 108 (90 BASE) MCG/ACT IN AERS
2.0000 | INHALATION_SPRAY | RESPIRATORY_TRACT | 4 refills | Status: AC | PRN
  Filled 2024-08-04: qty 6.7, 30d supply, fill #0

## 2024-08-04 MED ORDER — ATORVASTATIN CALCIUM 80 MG PO TABS
80.0000 mg | ORAL_TABLET | Freq: Every morning | ORAL | 4 refills | Status: AC
  Filled 2024-08-04: qty 90, 90d supply, fill #0
  Filled 2024-11-17: qty 90, 90d supply, fill #1

## 2024-08-04 MED ORDER — DILTIAZEM HCL ER COATED BEADS 180 MG PO CP24
180.0000 mg | ORAL_CAPSULE | Freq: Every day | ORAL | 4 refills | Status: AC
  Filled 2024-08-04: qty 90, 90d supply, fill #0
  Filled 2024-11-17: qty 90, 90d supply, fill #1

## 2024-08-04 MED ORDER — METFORMIN HCL ER 500 MG PO TB24
500.0000 mg | ORAL_TABLET | Freq: Two times a day (BID) | ORAL | 4 refills | Status: AC
  Filled 2024-08-04 – 2024-10-15 (×2): qty 200, 100d supply, fill #0

## 2024-08-04 MED ORDER — FUROSEMIDE 20 MG PO TABS
ORAL_TABLET | ORAL | 4 refills | Status: AC
  Filled 2024-08-04: qty 180, 90d supply, fill #0

## 2024-08-04 MED ORDER — EZETIMIBE 10 MG PO TABS
10.0000 mg | ORAL_TABLET | Freq: Every day | ORAL | 4 refills | Status: AC
  Filled 2024-08-04: qty 90, 90d supply, fill #0
  Filled 2024-11-17: qty 90, 90d supply, fill #1

## 2024-08-04 NOTE — Assessment & Plan Note (Signed)
 Refer to diabetes with obesity plan of care.

## 2024-08-04 NOTE — Assessment & Plan Note (Signed)
Chronic, ongoing - recent labs normal range.  At this time continue Ramipril for kidney protection.  CMP today.

## 2024-08-04 NOTE — Assessment & Plan Note (Signed)
Noted on CT February 2018, continue statin therapy as ordered.

## 2024-08-04 NOTE — Assessment & Plan Note (Signed)
 BMI 34.49 with underlying T2DM, HTN/HLD.  Recommended eating smaller high protein, low fat meals more frequently and exercising 30 mins a day 5 times a week with a goal of 10-15lb weight loss in the next 3 months. Patient voiced their understanding and motivation to adhere to these recommendations.

## 2024-08-04 NOTE — Assessment & Plan Note (Signed)
 Chronic, ongoing.  A1c today 6.3%, slight trend up from previous with some weight gain. Urine ALB 80 February 2025, continue Ramipril  for kidney protection.  Check BS 2-3 times a day, he is noticing a down trend in sugar levels.  Recommend heavy focus on diabetic diet and maintain current regimen at this time.  Return in 3 months. Praised for this success.  Continue to work with [facility]. - ACE and statin on board - Foot exam up to date, recommend he schedule eye exam. - Vaccines up to date

## 2024-08-04 NOTE — Progress Notes (Signed)
 BP 131/71   Pulse 79   Temp 97.8 F (36.6 C) (Oral)   Ht 5' 10 (1.778 m)   Wt 240 lb 6.4 oz (109 kg)   SpO2 96%   BMI 34.49 kg/m    Subjective:    Patient ID: Ernest [name], male    DOB: Aug 15, 1951, 73 y.o.   MRN: [id number]  HPI: Ernest [name] is a 73 y.o. male  Chief Complaint  Patient presents with   Atrial Fibrillation   Chronic Kidney Disease   Diabetes   Hyperlipidemia   Hypertension   DIABETES May A1c 5.6%. Continues Metformin  XR 500 MG BID, Ozempic  0.5 MG weekly, and Farxiga  10 MG daily.  Higher doses of Metformin  caused GI issues. Gabapentin  1200 MG for RLS at night taken, without it cannot sleep. Has gained some weight since last visit.  Works with [employer] [facility] for medications and assistance -- Breztri  & Farxiga .  Hypoglycemic episodes:no Polydipsia/polyuria: no Visual disturbance: no Chest pain: no Paresthesias: no Glucose Monitoring: yes  Accucheck frequency: varies  Fasting glucose: 130 range last time he checked  Post prandial:  Evening:  Before meals: Taking Insulin ?: no  Long acting insulin :  Short acting insulin : Blood Pressure Monitoring: weekly Retinal Examination: Not Up to Date -- [employer] Exam: Up to Date Pneumovax: Up to Date Influenza: Up to Date Aspirin : no   HYPERTENSION / HYPERLIPIDEMIA Takes Imdur , Lasix , Cardizem , Zetia , Atorvastatin , Ramipril , Metoprolol .  Saw cardiology last on 04/11/23, they referred him to pulmonary due to ongoing SOB with a reassuring cardiac work-up. Has not scheduled with pulmonary yet. Has CPAP at home but does not use as wakes up with it on the floor all the time. Satisfied with current treatment? yes Duration of hypertension: chronic BP monitoring frequency: occasional BP range: <120/80 range  BP medication side effects: no Duration of hyperlipidemia: chronic Cholesterol medication side effects: no Cholesterol supplements: none Medication compliance: good compliance Aspirin :  no Recent stressors: no Recurrent headaches: no Visual changes: no Palpitations: occasional Dyspnea: occasional with the heat, Albuterol  helps Chest pain: no Lower extremity edema: no Dizzy/lightheaded: no  ATRIAL FIBRILLATION Takes Xarelto  and Metoprolol . Atrial fibrillation status: stable Satisfied with current treatment: yes  Medication side effects:  no Medication compliance: good compliance Etiology of atrial fibrillation:  Palpitations:  occasional Chest pain:  no Dyspnea on exertion:  as above Orthopnea:  no Syncope:  no Edema:  no Ventricular rate control: B-blocker Anti-coagulation: long acting   CHRONIC KIDNEY DISEASE (Stage 3a) CKD status: stable Medications renally dose: yes Previous renal evaluation: no Pneumovax:  Up to Date Influenza Vaccine:  Up to Date   COPD Uses Breztri  and Albuterol . Smoked for 30 years ago, smoked about 4 PPD, got hypnotized and quit. COPD status: stable Satisfied with current treatment?: yes Oxygen use: no Dyspnea frequency: no Cough frequency: no Rescue inhaler frequency:   Limitation of activity: no Productive cough: no Last Spirometry: 07/04/22 --- FEV1/FVC >80% and FEV1 similar Pneumovax: Up to Date Influenza: Up to Date   ANEMIA Ran out of supplements awhile back and has not been taking. Anemia status: stable Etiology of anemia: unknown Duration of anemia treatment: chronic Compliance with treatment: good compliance Iron  supplementation side effects: no Severity of anemia: mild Fatigue: no Decreased exercise tolerance: no  Dyspnea on exertion: no Palpitations: no Bleeding: no Pica: no   Relevant past medical, surgical, family and social history reviewed and updated as indicated. Interim medical history since our last visit reviewed. Allergies and  medications reviewed and updated.  Review of Systems  Constitutional:  Negative for activity change, diaphoresis, fatigue and fever.  Respiratory:  Negative for  cough, chest tightness, shortness of breath and wheezing.   Cardiovascular:  Negative for chest pain, palpitations and leg swelling.  Gastrointestinal: Negative.   Endocrine: Negative for cold intolerance, heat intolerance, polydipsia, polyphagia and polyuria.  Neurological: Negative.   Psychiatric/Behavioral: Negative.     Per HPI unless specifically indicated above     Objective:    BP 131/71   Pulse 79   Temp 97.8 F (36.6 C) (Oral)   Ht 5' 10 (1.778 m)   Wt 240 lb 6.4 oz (109 kg)   SpO2 96%   BMI 34.49 kg/m   Wt Readings from Last 3 Encounters:  08/04/24 240 lb 6.4 oz (109 kg)  06/05/24 226 lb (102.5 kg)  05/02/24 229 lb 9.6 oz (104.1 kg)    Physical Exam Vitals and nursing note reviewed.  Constitutional:      General: He is awake. He is not in acute distress.    Appearance: He is well-developed and well-groomed. He is obese. He is not ill-appearing or toxic-appearing.  HENT:     Head: Normocephalic.     Right Ear: Hearing and external ear normal.     Left Ear: Hearing and external ear normal.  Eyes:     General: Lids are normal.     Extraocular Movements: Extraocular movements intact.     Conjunctiva/sclera: Conjunctivae normal.  Neck:     Thyroid : No thyromegaly.     Vascular: No carotid bruit.  Cardiovascular:     Rate and Rhythm: Normal rate and regular rhythm.     Heart sounds: Normal heart sounds. No murmur heard.    No gallop.  Pulmonary:     Effort: No accessory muscle usage or respiratory distress.     Breath sounds: Normal breath sounds.  Abdominal:     General: Bowel sounds are normal. There is no distension.     Palpations: Abdomen is soft.     Tenderness: There is no abdominal tenderness.  Musculoskeletal:     Cervical back: Full passive range of motion without pain.     Right lower leg: No edema.     Left lower leg: No edema.  Lymphadenopathy:     Cervical: No cervical adenopathy.  Skin:    General: Skin is warm.     Capillary Refill:  Capillary refill takes less than 2 seconds.  Neurological:     Mental Status: He is alert and oriented to person, place, and time.     Deep Tendon Reflexes: Reflexes are normal and symmetric.     Reflex Scores:      Brachioradialis reflexes are 2+ on the right side and 2+ on the left side.      Patellar reflexes are 2+ on the right side and 2+ on the left side. Psychiatric:        Attention and Perception: Attention normal.        Mood and Affect: Mood normal.        Speech: Speech normal.        Behavior: Behavior normal. Behavior is cooperative.        Thought Content: Thought content normal.    Results for orders placed or performed in visit on 05/02/24  Bayer DCA Hb A1c Waived   Collection Time: 05/02/24  2:43 PM  Result Value Ref Range   HB A1C (BAYER DCA - WAIVED)  5.6 4.8 - 5.6 %  CBC with Differential/Platelet   Collection Time: 05/02/24  2:44 PM  Result Value Ref Range   WBC 4.7 3.4 - 10.8 x10E3/uL   RBC 5.28 4.14 - 5.80 x10E6/uL   Hemoglobin 13.9 13.0 - 17.7 g/dL   Hematocrit 54.8 62.4 - 51.0 %   MCV 85 79 - 97 fL   MCH 26.3 (L) 26.6 - 33.0 pg   MCHC 30.8 (L) 31.5 - 35.7 g/dL   RDW 83.7 (H) 88.3 - 84.5 %   Platelets 158 150 - 450 x10E3/uL   Neutrophils 52 Not Estab. %   Lymphs 36 Not Estab. %   Monocytes 8 Not Estab. %   Eos 3 Not Estab. %   Basos 1 Not Estab. %   Neutrophils Absolute 2.5 1.4 - 7.0 x10E3/uL   Lymphocytes Absolute 1.7 0.7 - 3.1 x10E3/uL   Monocytes Absolute 0.4 0.1 - 0.9 x10E3/uL   EOS (ABSOLUTE) 0.1 0.0 - 0.4 x10E3/uL   Basophils Absolute 0.0 0.0 - 0.2 x10E3/uL   Immature Granulocytes 0 Not Estab. %   Immature Grans (Abs) 0.0 0.0 - 0.1 x10E3/uL  Ferritin   Collection Time: 05/02/24  2:44 PM  Result Value Ref Range   Ferritin 64 30 - 400 ng/mL  Iron    Collection Time: 05/02/24  2:44 PM  Result Value Ref Range   Iron  47 38 - 169 ug/dL  Lipid Panel w/o Chol/HDL Ratio   Collection Time: 05/02/24  2:44 PM  Result Value Ref Range    Cholesterol, Total 71 (L) 100 - 199 mg/dL   Triglycerides 874 0 - 149 mg/dL   HDL 28 (L) >60 mg/dL   VLDL Cholesterol Cal 23 5 - 40 mg/dL   LDL Chol Calc (NIH) 20 0 - 99 mg/dL  Comprehensive metabolic panel with GFR   Collection Time: 05/02/24  2:44 PM  Result Value Ref Range   Glucose 96 70 - 99 mg/dL   BUN 20 8 - 27 mg/dL   Creatinine, Ser 8.80 0.76 - 1.27 mg/dL   eGFR 64 >40 fO/fpw/8.26   BUN/Creatinine Ratio 17 10 - 24   Sodium 140 134 - 144 mmol/L   Potassium 4.0 3.5 - 5.2 mmol/L   Chloride 100 96 - 106 mmol/L   CO2 25 20 - 29 mmol/L   Calcium  10.2 8.6 - 10.2 mg/dL   Total Protein 6.9 6.0 - 8.5 g/dL   Albumin 4.7 3.8 - 4.8 g/dL   Globulin, Total 2.2 1.5 - 4.5 g/dL   Bilirubin Total 1.4 (H) 0.0 - 1.2 mg/dL   Alkaline Phosphatase 97 44 - 121 IU/L   AST 24 0 - 40 IU/L   ALT 25 0 - 44 IU/L      Assessment & Plan:   Problem List Items Addressed This Visit       Cardiovascular and Mediastinum   Hypertension associated with diabetes (HCC)   Chronic, stable.  BP remains well below goal.  Will continue Ramipril  2.5 MG daily, educated him on this (maintain for kidney protection), and maintain rest of regimen as is.  Recommend he monitor BP at least a few mornings a week at home and document.  DASH diet at home.  Labs today: CBC, CMP.  Return in 3 months.       Relevant Medications   atorvastatin  (LIPITOR ) 80 MG tablet   diltiazem  (CARDIZEM  CD) 180 MG 24 hr capsule   ezetimibe  (ZETIA ) 10 MG tablet   furosemide  (LASIX ) 20 MG tablet  metFORMIN  (GLUCOPHAGE -XR) 500 MG 24 hr tablet   ramipril  (ALTACE ) 2.5 MG capsule   Other Relevant Orders   Bayer DCA Hb A1c Waived   Calcification of abdominal aorta (HCC)   Noted on CT February 2018, continue statin therapy as ordered.      Relevant Medications   atorvastatin  (LIPITOR ) 80 MG tablet   diltiazem  (CARDIZEM  CD) 180 MG 24 hr capsule   ezetimibe  (ZETIA ) 10 MG tablet   furosemide  (LASIX ) 20 MG tablet   ramipril  (ALTACE ) 2.5 MG  capsule   Atrial fibrillation (HCC)   Chronic, ongoing.  Rate controlled.  Continue collaboration with cardiology and current medication regimen as ordered by them.  Recent note reviewed. Recommend he schedule follow-up with cardiology.      Relevant Medications   atorvastatin  (LIPITOR ) 80 MG tablet   diltiazem  (CARDIZEM  CD) 180 MG 24 hr capsule   ezetimibe  (ZETIA ) 10 MG tablet   furosemide  (LASIX ) 20 MG tablet   ramipril  (ALTACE ) 2.5 MG capsule   Aortic atherosclerosis (HCC)   Chronic, ongoing.  Noted on imaging 03/05/18.  Continue statin therapy and Eliquis for prevention.      Relevant Medications   atorvastatin  (LIPITOR ) 80 MG tablet   diltiazem  (CARDIZEM  CD) 180 MG 24 hr capsule   ezetimibe  (ZETIA ) 10 MG tablet   furosemide  (LASIX ) 20 MG tablet   ramipril  (ALTACE ) 2.5 MG capsule   Other Relevant Orders   Comprehensive metabolic panel with GFR   Lipid Panel w/o Chol/HDL Ratio     Respiratory   COPD (chronic obstructive pulmonary disease) with emphysema (HCC)   Chronic, ongoing.  History of smoking.  Levels >80%.  Continue current inhaler regimen and adjust as needed.  Recommend he consistently use his Breztri  and not miss doses as may benefit his SOB, educated him on this.  Recommend he reschedule with pulmonary for further testing due to SOB.      Relevant Medications   albuterol  (VENTOLIN  HFA) 108 (90 Base) MCG/ACT inhaler   Other Relevant Orders   CBC with Differential/Platelet     Endocrine   Type 2 diabetes mellitus with morbid obesity (HCC) - Primary   Chronic, ongoing.  A1c today 6.3%, slight trend up from previous with some weight gain. Urine ALB 80 February 2025, continue Ramipril  for kidney protection.  Check BS 2-3 times a day, he is noticing a down trend in sugar levels.  Recommend heavy focus on diabetic diet and maintain current regimen at this time.  Return in 3 months. Praised for this success.  Continue to work with [facility]. - ACE and statin on board -  Foot exam up to date, recommend he schedule eye exam. - Vaccines up to date      Relevant Medications   atorvastatin  (LIPITOR ) 80 MG tablet   metFORMIN  (GLUCOPHAGE -XR) 500 MG 24 hr tablet   ramipril  (ALTACE ) 2.5 MG capsule   Other Relevant Orders   Bayer DCA Hb A1c Waived   Hyperlipidemia associated with type 2 diabetes mellitus (HCC)   Chronic, ongoing.  Continue current medication regimen and adjust as needed.  Lipid panel today.         Relevant Medications   atorvastatin  (LIPITOR ) 80 MG tablet   diltiazem  (CARDIZEM  CD) 180 MG 24 hr capsule   ezetimibe  (ZETIA ) 10 MG tablet   furosemide  (LASIX ) 20 MG tablet   metFORMIN  (GLUCOPHAGE -XR) 500 MG 24 hr tablet   ramipril  (ALTACE ) 2.5 MG capsule   Other Relevant Orders   Bayer DCA Hb  A1c Waived   Comprehensive metabolic panel with GFR   Lipid Panel w/o Chol/HDL Ratio   Diabetes mellitus treated with injections of non-insulin  medication (HCC)   Refer to diabetes with obesity plan of care.      Relevant Medications   atorvastatin  (LIPITOR ) 80 MG tablet   metFORMIN  (GLUCOPHAGE -XR) 500 MG 24 hr tablet   ramipril  (ALTACE ) 2.5 MG capsule   Other Relevant Orders   Bayer DCA Hb A1c Waived     Genitourinary   Stage 3a chronic kidney disease (CKD) (HCC)   Chronic, ongoing - recent labs normal range.  At this time continue Ramipril  for kidney protection.  CMP today.        Relevant Orders   CBC with Differential/Platelet   Comprehensive metabolic panel with GFR     Other   Restless legs   Chronic, ongoing.  Continue Gabapentin  at HS, which offers benefit.  Continue B12 supplement at home.      Relevant Medications   gabapentin  (NEURONTIN ) 600 MG tablet   Obesity   BMI 34.49 with underlying T2DM, HTN/HLD.  Recommended eating smaller high protein, low fat meals more frequently and exercising 30 mins a day 5 times a week with a goal of 10-15lb weight loss in the next 3 months. Patient voiced their understanding and motivation to  adhere to these recommendations.       Relevant Medications   metFORMIN  (GLUCOPHAGE -XR) 500 MG 24 hr tablet   Iron  deficiency anemia due to chronic blood loss   Chronic with intermittent episodes of low levels presenting, has received infusions in past and had GI work-up. Has been out of supplements.  Recheck labs today and restart as needed.  Goal is to avoid infusions if possible, but he will return if needed to see hematology.  Denies any recent bleeding.      Relevant Orders   Ferritin   Iron    CBC with Differential/Platelet     Follow up plan: Return in about 3 months (around 11/04/2024) for T2DM, HTN/HLD, A-FIB, COPD, ANEMIA.

## 2024-08-04 NOTE — Assessment & Plan Note (Signed)
 Chronic, ongoing.  Continue current medication regimen and adjust as needed. Lipid panel today.

## 2024-08-04 NOTE — Assessment & Plan Note (Signed)
Chronic, ongoing.  Noted on imaging 03/05/18.  Continue statin therapy and Eliquis for prevention.

## 2024-08-04 NOTE — Assessment & Plan Note (Signed)
 Chronic with intermittent episodes of low levels presenting, has received infusions in past and had GI work-up. Has been out of supplements.  Recheck labs today and restart as needed.  Goal is to avoid infusions if possible, but he will return if needed to see hematology.  Denies any recent bleeding.

## 2024-08-04 NOTE — Assessment & Plan Note (Signed)
Chronic, ongoing.  History of smoking.  Levels >80%.  Continue current inhaler regimen and adjust as needed.  Recommend he consistently use his Breztri and not miss doses as may benefit his SOB, educated him on this.  Recommend he reschedule with pulmonary for further testing due to SOB.

## 2024-08-04 NOTE — Assessment & Plan Note (Signed)
 Chronic, stable.  BP remains well below goal.  Will continue Ramipril  2.5 MG daily, educated him on this (maintain for kidney protection), and maintain rest of regimen as is.  Recommend he monitor BP at least a few mornings a week at home and document.  DASH diet at home.  Labs today: CBC, CMP.  Return in 3 months.

## 2024-08-04 NOTE — Assessment & Plan Note (Signed)
Chronic, ongoing.  Continue Gabapentin at HS, which offers benefit.  Continue B12 supplement at home.

## 2024-08-04 NOTE — Assessment & Plan Note (Signed)
 Chronic, ongoing.  Rate controlled.  Continue collaboration with cardiology and current medication regimen as ordered by them.  Recent note reviewed. Recommend he schedule follow-up with cardiology.

## 2024-08-04 NOTE — Assessment & Plan Note (Signed)
 Chronic, ongoing.  Continue collaboration with cardiology, recent note reviewed.  Minimal use of NTG.  Recommend he schedule follow-up with cardiology.

## 2024-08-05 ENCOUNTER — Ambulatory Visit: Payer: Self-pay | Admitting: Nurse Practitioner

## 2024-08-05 LAB — CBC WITH DIFFERENTIAL/PLATELET
Basophils Absolute: 0.1 x10E3/uL (ref 0.0–0.2)
Basos: 1 %
EOS (ABSOLUTE): 0.2 x10E3/uL (ref 0.0–0.4)
Eos: 4 %
Hematocrit: 44.4 % (ref 37.5–51.0)
Hemoglobin: 13.8 g/dL (ref 13.0–17.7)
Immature Grans (Abs): 0 x10E3/uL (ref 0.0–0.1)
Immature Granulocytes: 0 %
Lymphocytes Absolute: 1.8 x10E3/uL (ref 0.7–3.1)
Lymphs: 34 %
MCH: 26.6 pg (ref 26.6–33.0)
MCHC: 31.1 g/dL — ABNORMAL LOW (ref 31.5–35.7)
MCV: 86 fL (ref 79–97)
Monocytes Absolute: 0.5 x10E3/uL (ref 0.1–0.9)
Monocytes: 9 %
Neutrophils Absolute: 2.7 x10E3/uL (ref 1.4–7.0)
Neutrophils: 52 %
Platelets: 165 x10E3/uL (ref 150–450)
RBC: 5.18 x10E6/uL (ref 4.14–5.80)
RDW: 14.2 % (ref 11.6–15.4)
WBC: 5.1 x10E3/uL (ref 3.4–10.8)

## 2024-08-05 LAB — COMPREHENSIVE METABOLIC PANEL WITH GFR
ALT: 25 IU/L (ref 0–44)
AST: 23 IU/L (ref 0–40)
Albumin: 4.6 g/dL (ref 3.8–4.8)
Alkaline Phosphatase: 89 IU/L (ref 44–121)
BUN/Creatinine Ratio: 14 (ref 10–24)
BUN: 18 mg/dL (ref 8–27)
Bilirubin Total: 2.1 mg/dL — ABNORMAL HIGH (ref 0.0–1.2)
CO2: 23 mmol/L (ref 20–29)
Calcium: 10 mg/dL (ref 8.6–10.2)
Chloride: 101 mmol/L (ref 96–106)
Creatinine, Ser: 1.29 mg/dL — ABNORMAL HIGH (ref 0.76–1.27)
Globulin, Total: 2.1 g/dL (ref 1.5–4.5)
Glucose: 140 mg/dL — ABNORMAL HIGH (ref 70–99)
Potassium: 4.4 mmol/L (ref 3.5–5.2)
Sodium: 140 mmol/L (ref 134–144)
Total Protein: 6.7 g/dL (ref 6.0–8.5)
eGFR: 58 mL/min/1.73 — ABNORMAL LOW (ref >59)

## 2024-08-05 LAB — IRON: Iron: 63 ug/dL (ref 38–169)

## 2024-08-05 LAB — LIPID PANEL W/O CHOL/HDL RATIO
Cholesterol, Total: 91 mg/dL — ABNORMAL LOW (ref 100–199)
HDL: 32 mg/dL — ABNORMAL LOW (ref >39)
LDL Chol Calc (NIH): 30 mg/dL (ref 0–99)
Triglycerides: 173 mg/dL — ABNORMAL HIGH (ref 0–149)
VLDL Cholesterol Cal: 29 mg/dL (ref 5–40)

## 2024-08-05 LAB — FERRITIN: Ferritin: 59 ng/mL (ref 30–400)

## 2024-08-05 NOTE — Progress Notes (Signed)
 Good morning, please let Ernest [name] know his labs have returned and overall remain at baseline and stable. Kidney function did drop a little this check, ensure to get good water intake daily and we will recheck next visit.  Iron  level better, I would continue supplement at least a few days a week.  Overall stable labs.  Any questions? Keep being amazing!!  Thank you for allowing me to participate in your care.  I appreciate you. Kindest regards, Elyna Pangilinan

## 2024-08-14 ENCOUNTER — Other Ambulatory Visit (HOSPITAL_COMMUNITY): Payer: Self-pay

## 2024-08-14 ENCOUNTER — Other Ambulatory Visit: Payer: Self-pay | Admitting: Nurse Practitioner

## 2024-08-15 ENCOUNTER — Other Ambulatory Visit (HOSPITAL_COMMUNITY): Payer: Self-pay

## 2024-08-15 ENCOUNTER — Telehealth: Payer: Self-pay

## 2024-08-15 NOTE — Telephone Encounter (Signed)
 Requested medications are due for refill today.  yes  Requested medications are on the active medications list.  yes  Last refill. 02/12/2024 #30 4 rf  Future visit scheduled.   yes  Notes to clinic.  Please review for refill.    Requested Prescriptions  Pending Prescriptions Disp Refills   rivaroxaban  (XARELTO ) 20 MG TABS tablet 30 tablet 4    Sig: Take 1 tablet (20 mg total) by mouth every evening.     Hematology: Anticoagulants - rivaroxaban  Failed - 08/15/2024  5:31 PM      Failed - Cr in normal range and within 360 days    Creatinine  Date Value Ref Range Status  07/25/2014 1.21 0.60 - 1.30 mg/dL Final   Creatinine, Ser  Date Value Ref Range Status  08/04/2024 1.29 (H) 0.76 - 1.27 mg/dL Final         Passed - ALT in normal range and within 360 days    ALT  Date Value Ref Range Status  08/04/2024 25 0 - 44 IU/L Final   ALT (SGPT) Piccolo, Waived  Date Value Ref Range Status  08/01/2017 33 10 - 47 U/L Final         Passed - AST in normal range and within 360 days    AST  Date Value Ref Range Status  08/04/2024 23 0 - 40 IU/L Final   AST (SGOT) Piccolo, Waived  Date Value Ref Range Status  08/01/2017 34 11 - 38 U/L Final         Passed - HCT in normal range and within 360 days    Hematocrit  Date Value Ref Range Status  08/04/2024 44.4 37.5 - 51.0 % Final         Passed - HGB in normal range and within 360 days    Hemoglobin  Date Value Ref Range Status  08/04/2024 13.8 13.0 - 17.7 g/dL Final         Passed - PLT in normal range and within 360 days    Platelets  Date Value Ref Range Status  08/04/2024 165 150 - 450 x10E3/uL Final         Passed - eGFR is 15 or above and within 360 days    EGFR (African American)  Date Value Ref Range Status  07/25/2014 >60  Final   GFR calc Af Amer  Date Value Ref Range Status  11/19/2020 81 >59 mL/min/1.73 Final    Comment:    **In accordance with recommendations from the NKF-ASN Task force,**   [facility] is  in the process of updating its eGFR calculation to the   2021 CKD-EPI creatinine equation that estimates kidney function   without a race variable.    EGFR (Non-African Amer.)  Date Value Ref Range Status  07/25/2014 >60  Final    Comment:    eGFR values <29mL/min/1.73 m2 may be an indication of chronic kidney disease (CKD). Calculated eGFR is useful in patients with stable renal function. The eGFR calculation will not be reliable in acutely ill patients when serum creatinine is changing rapidly. It is not useful in  patients on dialysis. The eGFR calculation may not be applicable to patients at the low and high extremes of body sizes, pregnant women, and vegetarians.    GFR, Estimated  Date Value Ref Range Status  04/04/2021 >60 >60 mL/min Final    Comment:    (NOTE) Calculated using the CKD-EPI Creatinine Equation (2021)    eGFR  Date Value Ref  Range Status  08/04/2024 58 (L) >59 mL/min/1.73 Final         Passed - Patient is not pregnant      Passed - Valid encounter within last 12 months    Recent Outpatient Visits           1 week ago Type 2 diabetes mellitus with morbid obesity (HCC)   [place] [facility] [place], [place] T, NP   3 months ago Type 2 diabetes mellitus with morbid obesity (HCC)   [place] [facility] [place], [place] T, NP   5 months ago Iron  deficiency anemia due to chronic blood loss   [place] [facility] [place], Delton T, NP   6 months ago Type 2 diabetes mellitus with morbid obesity (HCC)   [place] [facility] [place], Melanie DASEN, NP

## 2024-08-19 ENCOUNTER — Other Ambulatory Visit: Payer: Self-pay

## 2024-08-19 ENCOUNTER — Other Ambulatory Visit (HOSPITAL_COMMUNITY): Payer: Self-pay

## 2024-08-19 MED ORDER — RIVAROXABAN 20 MG PO TABS
20.0000 mg | ORAL_TABLET | Freq: Every evening | ORAL | 4 refills | Status: DC
  Filled 2024-08-19: qty 30, 30d supply, fill #0
  Filled 2024-09-18: qty 30, 30d supply, fill #1
  Filled 2024-10-15: qty 30, 30d supply, fill #2
  Filled 2024-11-17: qty 30, 30d supply, fill #3
  Filled 2024-12-16: qty 30, 30d supply, fill #4

## 2024-09-18 ENCOUNTER — Other Ambulatory Visit (HOSPITAL_COMMUNITY): Payer: Self-pay

## 2024-09-18 ENCOUNTER — Other Ambulatory Visit: Payer: Self-pay | Admitting: Nurse Practitioner

## 2024-09-19 ENCOUNTER — Other Ambulatory Visit (HOSPITAL_COMMUNITY): Payer: Self-pay

## 2024-09-19 ENCOUNTER — Other Ambulatory Visit: Payer: Self-pay

## 2024-09-19 MED ORDER — ISOSORBIDE MONONITRATE ER 60 MG PO TB24
60.0000 mg | ORAL_TABLET | Freq: Every morning | ORAL | 1 refills | Status: AC
  Filled 2024-09-19: qty 90, 90d supply, fill #0
  Filled 2024-12-16: qty 90, 90d supply, fill #1

## 2024-09-19 NOTE — Telephone Encounter (Signed)
 Requested Prescriptions  Pending Prescriptions Disp Refills   isosorbide  mononitrate (IMDUR ) 60 MG 24 hr tablet 90 tablet 1    Sig: TAKE ONE TABLET BY MOUTH EVERY MORNING     Cardiovascular:  Nitrates Passed - 09/19/2024  2:29 PM      Passed - Last BP in normal range    BP Readings from Last 1 Encounters:  08/04/24 131/71         Passed - Last Heart Rate in normal range    Pulse Readings from Last 1 Encounters:  08/04/24 79         Passed - Valid encounter within last 12 months    Recent Outpatient Visits           1 month ago Type 2 diabetes mellitus with morbid obesity (HCC)   [place] [facility] [place], [place] T, NP   4 months ago Type 2 diabetes mellitus with morbid obesity (HCC)   [place] [facility] [place], [place] T, NP   6 months ago Iron  deficiency anemia due to chronic blood loss   [place] [facility] [place], Parks T, NP   7 months ago Type 2 diabetes mellitus with morbid obesity (HCC)   [place] [name] [place], Melanie DASEN, NP
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
## 2024-10-06 ENCOUNTER — Other Ambulatory Visit: Payer: Self-pay

## 2024-10-06 ENCOUNTER — Encounter: Payer: Self-pay | Admitting: Physician Assistant

## 2024-10-06 ENCOUNTER — Ambulatory Visit: Attending: Physician Assistant | Admitting: Physician Assistant

## 2024-10-06 ENCOUNTER — Ambulatory Visit (INDEPENDENT_AMBULATORY_CARE_PROVIDER_SITE_OTHER)

## 2024-10-06 VITALS — BP 120/70 | HR 76 | Ht 68.0 in | Wt 242.0 lb

## 2024-10-06 DIAGNOSIS — E785 Hyperlipidemia, unspecified: Secondary | ICD-10-CM | POA: Diagnosis not present

## 2024-10-06 DIAGNOSIS — I4821 Permanent atrial fibrillation: Secondary | ICD-10-CM

## 2024-10-06 DIAGNOSIS — I10 Essential (primary) hypertension: Secondary | ICD-10-CM | POA: Diagnosis not present

## 2024-10-06 DIAGNOSIS — I2510 Atherosclerotic heart disease of native coronary artery without angina pectoris: Secondary | ICD-10-CM | POA: Diagnosis not present

## 2024-10-06 MED ORDER — METOPROLOL SUCCINATE ER 25 MG PO TB24
25.0000 mg | ORAL_TABLET | Freq: Two times a day (BID) | ORAL | 1 refills | Status: AC
  Filled 2024-10-06: qty 180, 90d supply, fill #0

## 2024-10-06 NOTE — Patient Instructions (Signed)
 Medication Instructions:  INCREASE the Metoprolol  (Toprol ) to 25 mg twice daily  *If you need a refill on your cardiac medications before your next appointment, please call your pharmacy*  Lab Work: None ordered If you have labs (blood work) drawn today and your tests are completely normal, you will receive your results only by: MyChart Message (if you have MyChart) OR A paper copy in the mail If you have any lab test that is abnormal or we need to change your treatment, we will call you to review the results.  Testing/Procedures: None ordered  Follow-Up: At [facility], you and your health needs are our priority.  As part of our continuing mission to provide you with exceptional heart care, our providers are all part of one team.  This team includes your primary Cardiologist (physician) and Advanced Practice Providers or APPs (Physician Assistants and Nurse Practitioners) who all work together to provide you with the care you need, when you need it.  Your next appointment:   2 month(s)  Provider:   Bernardino Bring, PA-C    We recommend signing up for the patient portal called MyChart.  Sign up information is provided on this After Visit Summary.  MyChart is used to connect with patients for Virtual Visits (Telemedicine).  Patients are able to view lab/test results, encounter notes, upcoming appointments, etc.  Non-urgent messages can be sent to your provider as well.   To learn more about what you can do with MyChart, go to [email or web address].   Other Instructions ZIO XT- Long Term Monitor Instructions  Your physician has requested you wear a ZIO patch monitor for 14 days.  This is a single patch monitor. [employer] supplies one patch monitor per enrollment. Additional stickers are not available. Please do not apply patch if you will be having a Nuclear Stress Test, Echocardiogram, Cardiac CT, MRI, or Chest Xray during the period you would be wearing the monitor. The patch  cannot be worn during these tests. You cannot remove and re-apply the ZIO XT patch monitor.  Your ZIO patch monitor will be mailed 3 day USPS to your address on file. It may take 3-5 days to receive your monitor after you have been enrolled. Once you have received your monitor, please review the enclosed instructions. Your monitor has already been registered assigning a specific monitor serial number to you.  Billing and Patient Assistance Program Information  We have supplied [employer] with any of your insurance information on file for billing purposes.  [employer] offers a sliding scale Patient Assistance Program for patients that do not have insurance, or whose insurance does not completely cover the cost of the ZIO monitor.  You must apply for the Patient Assistance Program to qualify for this discounted rate.  To apply, please call [employer] at [phone], select option 4, select option 2, ask to apply for Patient Assistance Program. Meredeth will ask your household income, and how many people are in your household. They will quote your out-of-pocket cost based on that information. [employer] will also be able to set up a 69-month, interest-free payment plan if needed.  Applying the monitor   Shave hair from upper left chest.  Hold abrader disc by orange tab. Rub abrader in 40 strokes over the upper left chest as indicated in your monitor instructions.  Clean area with 4 enclosed alcohol pads. Let dry.  Apply patch as indicated in monitor instructions. Patch will be placed under collarbone on left side of chest with arrow pointing upward.  Rub patch adhesive wings for 2 minutes. Remove white label marked 1. Remove the white label marked 2. Rub patch adhesive wings for 2 additional minutes.  While looking in a mirror, press and release button in center of patch. A small green light will flash 3-4 times. This will be your only indicator that the monitor has been turned on.  Do not shower for the first  24 hours. You may shower after the first 24 hours.  Press the button if you feel a symptom. You will hear a small click. Record Date, Time and Symptom in the Patient Logbook.  When you are ready to remove the patch, follow instructions on the last 2 pages of Patient Logbook.  Stick patch monitor into the tabs at the bottom of the return box.  Place Patient Logbook in the blue and white box. Use locking tab on box and tape box closed securely. The blue and white box has prepaid postage on it. Please place it in the mailbox as soon as possible. Your physician should have your test results approximately 7-14 days after the monitor has been mailed back to [facility].  Call [facility] Customer Care at [phone] if you have questions regarding your ZIO XT patch monitor.  Call them immediately if you see an orange light blinking on your monitor.  If your monitor falls off in less than 4 days, contact our Monitor department at [phone].  If your monitor becomes loose or falls off after 4 days call [employer] at [phone] for suggestions on securing your monitor.

## 2024-10-15 ENCOUNTER — Other Ambulatory Visit: Payer: Self-pay

## 2024-10-16 ENCOUNTER — Telehealth: Payer: Self-pay

## 2024-10-16 NOTE — Telephone Encounter (Signed)
 Pt call explain he has not received his pap in the mail for AZ[employer]ME and Novo Nordisk ,explain that on Novo Nordisk Ozmepic company will no longer cover he needs to see if his new Ins will cover this medication on [employer]ME Farxiga  and Breztri  pt will call [employer] to give consent to 2026 enrollment,pt will call back if he needs any assistance.

## 2024-10-30 NOTE — Telephone Encounter (Signed)
 Pt will coming on Nov. 19th will sign at provider office.

## 2024-11-02 NOTE — Patient Instructions (Incomplete)

## 2024-11-07 ENCOUNTER — Ambulatory Visit: Admitting: Nurse Practitioner

## 2024-11-07 DIAGNOSIS — J439 Emphysema, unspecified: Secondary | ICD-10-CM

## 2024-11-07 DIAGNOSIS — G4733 Obstructive sleep apnea (adult) (pediatric): Secondary | ICD-10-CM

## 2024-11-07 DIAGNOSIS — Z7985 Long-term (current) use of injectable non-insulin antidiabetic drugs: Secondary | ICD-10-CM

## 2024-11-07 DIAGNOSIS — I152 Hypertension secondary to endocrine disorders: Secondary | ICD-10-CM

## 2024-11-07 DIAGNOSIS — E1169 Type 2 diabetes mellitus with other specified complication: Secondary | ICD-10-CM

## 2024-11-07 DIAGNOSIS — D500 Iron deficiency anemia secondary to blood loss (chronic): Secondary | ICD-10-CM

## 2024-11-07 DIAGNOSIS — N1831 Chronic kidney disease, stage 3a: Secondary | ICD-10-CM

## 2024-11-07 DIAGNOSIS — I4811 Longstanding persistent atrial fibrillation: Secondary | ICD-10-CM

## 2024-11-07 DIAGNOSIS — Z23 Encounter for immunization: Secondary | ICD-10-CM

## 2024-11-10 NOTE — Telephone Encounter (Signed)
 Received pt portion today AZ[employer]ME Farxiga  and Breztri , faxing provider portion.not received.provider portion.

## 2024-11-11 ENCOUNTER — Ambulatory Visit: Payer: Self-pay | Admitting: Physician Assistant

## 2024-11-11 DIAGNOSIS — I4821 Permanent atrial fibrillation: Secondary | ICD-10-CM

## 2024-11-14 NOTE — Telephone Encounter (Signed)
 Received provider portion ,faxed to [employer] along provider portion.

## 2024-11-15 NOTE — Patient Instructions (Signed)

## 2024-11-17 ENCOUNTER — Other Ambulatory Visit (HOSPITAL_COMMUNITY): Payer: Self-pay

## 2024-11-18 ENCOUNTER — Other Ambulatory Visit: Payer: Self-pay

## 2024-11-19 NOTE — Telephone Encounter (Signed)
 Received approval letter from [employer] on Farxiga  and Breztri  thru 12/17/2025,approval letter index.

## 2024-11-19 NOTE — Progress Notes (Unsigned)
 11/20/24 Name: Christina Waldrop MRN: [id number] DOB: [birth year]  Gearold Wainer is a 73 y.o. year old male who presented for a telephone follow-up visit.   They were referred to the pharmacist by their PCP for assistance in managing medication access.   Subjective:  Care Team: Primary Care Provider: Valerio Melanie DASEN, NP ; Next Scheduled Visit: 08/04/2024  Medication Access/Adherence  Current Pharmacy:  [facility] - [facility] [address] [place] [address] Phone: [phone] Fax: [phone]  [facility] - [place], [place] - [address] [place] [address] Phone: [phone] Fax: [phone]  -Patient reports affordability concerns with their medications: No  -Patient reports access/transportation concerns to their pharmacy: No  -Patient reports adherence concerns with their medications:  No    Diabetes: Current medications: Farxiga  10mg  daily, Ozempic  0.5mg  weekly, metformin  xr 500mg  BID -Patient does monitor home BG on occasion but does not provide values -Patient denies hypoglycemic s/sx including dizziness, shakiness, sweating.  -Patient denies hyperglycemic symptoms including polyuria, polydipsia, polyphagia, nocturia, neuropathy, blurred vision. -Current medication access support: Currently enrolled in Novo PAP for Ozempic  0.5mg  and has 1 pen left currently.  This medication will not be available through Novo starting in January- made patient aware.  He is also enrolled in [employer] PAP for Farxiga  10mg  daily and has been approved for re-enrollment for 2026 -UACR 30-300 on 2/12  Hypertension: Current medications: diltizaem CD 180mg  daily, furosemide  20mg  BID, isosorbide  MN ER 60, metoprolol  XL 25mg  BID, nitroglycerin  PRN, ramipril  2.5mg  daily -Patient does not monitor home BP regularly -Last office BP reading was 120/70 -Patient denies hypotensive s/sx including dizziness, lightheadedness.  -Patient denies  hypertensive symptoms including headache, chest pain, shortness of breath  Hyperlipidemia/ASCVD Risk Reduction Current lipid lowering medications: atorvastatin  80mg  daily, ezetimibe  10mg  daily  ASCVD History: PMH of HI, CAD Family History:  Mother- heart disease Risk Factors:  former smoker, HTN, Diabetes  PREVENT Risk Score: 10 year risk of CVD: 36% - 10 year risk of ASCVD: 20.2% - 10 year risk of HF: 31.4%  Atrial Fibrillation: Current medications: Xarelto  20mg  daily, metoprolol  XL 25mg  daily, diltiazem  CD 180mg  daily -Patient states insurance covers Xarelto  at an affordable copay  COPD: Current medications: Breztri  2 puffs BID, albuterol  rescue inhaler  -Reports 0 exacerbations in the past year -Current medication access support: enrolled in [employer]Me PAP for Breztri  and has been approved for re-enrollment for 2026  Objective:  Lab Results  Component Value Date   HGBA1C 6.3 (H) 08/04/2024   Lab Results  Component Value Date   CREATININE 1.29 (H) 08/04/2024   BUN 18 08/04/2024   NA 140 08/04/2024   K 4.4 08/04/2024   CL 101 08/04/2024   CO2 23 08/04/2024   Lab Results  Component Value Date   CHOL 91 (L) 08/04/2024   HDL 32 (L) 08/04/2024   LDLCALC 30 08/04/2024   TRIG 173 (H) 08/04/2024   CHOLHDL 2.6 04/03/2022   Assessment/Plan:   Diabetes: -Currently controlled with A1c <7%, but this had increased slightly from 5.6% to 6.3% -Continue current regimen at this time -Test claim reflects insurance will cover Ozempic  and current copay would be $0; this may change at the beginning of th year, though, based on a deductible. -Patient sees PCP again 12/9 and will be due for A1c; if this has increased further, consider increasing Ozempic  to 1mg  weekly- I recommend sending a 3 month supply of Ozempic  into into  [facility] at upcoming visit, so patient will start the year with a good amount on hand.   -Due for UACR  Hypertension: -Currently controlled -Continue current regimen and  regular follow-up with PCP and PharmD  Hyperlipidemia/ASCVD Risk Reduction: -Currently controlled.  -Continue current regimen and regular follow-up with PCP and PharmD  Atrial Fibrillation: -Currently controlled -Continue current regimen and regular follow-up with PCP and PharmD  COPD: -Currently controlled.  -Continue current regimen and regular follow-up with PCP and PharmD  Follow Up Plan: 6 months  Channing DELENA Mealing, PharmD, DPLA

## 2024-11-20 ENCOUNTER — Other Ambulatory Visit: Payer: Self-pay

## 2024-11-20 DIAGNOSIS — J439 Emphysema, unspecified: Secondary | ICD-10-CM

## 2024-11-20 DIAGNOSIS — E1169 Type 2 diabetes mellitus with other specified complication: Secondary | ICD-10-CM

## 2024-11-20 DIAGNOSIS — I152 Hypertension secondary to endocrine disorders: Secondary | ICD-10-CM

## 2024-11-20 DIAGNOSIS — I4811 Longstanding persistent atrial fibrillation: Secondary | ICD-10-CM

## 2024-11-20 NOTE — Progress Notes (Signed)
 Noted

## 2024-11-25 ENCOUNTER — Encounter: Payer: Self-pay | Admitting: Nurse Practitioner

## 2024-11-25 ENCOUNTER — Ambulatory Visit: Admitting: Nurse Practitioner

## 2024-11-25 DIAGNOSIS — E1159 Type 2 diabetes mellitus with other circulatory complications: Secondary | ICD-10-CM

## 2024-11-25 DIAGNOSIS — N1831 Chronic kidney disease, stage 3a: Secondary | ICD-10-CM

## 2024-11-25 DIAGNOSIS — D500 Iron deficiency anemia secondary to blood loss (chronic): Secondary | ICD-10-CM

## 2024-11-25 DIAGNOSIS — E119 Type 2 diabetes mellitus without complications: Secondary | ICD-10-CM

## 2024-11-25 DIAGNOSIS — Z23 Encounter for immunization: Secondary | ICD-10-CM

## 2024-11-25 DIAGNOSIS — G4733 Obstructive sleep apnea (adult) (pediatric): Secondary | ICD-10-CM

## 2024-11-25 DIAGNOSIS — I4811 Longstanding persistent atrial fibrillation: Secondary | ICD-10-CM

## 2024-11-25 DIAGNOSIS — G2581 Restless legs syndrome: Secondary | ICD-10-CM

## 2024-11-25 DIAGNOSIS — E1169 Type 2 diabetes mellitus with other specified complication: Secondary | ICD-10-CM

## 2024-11-25 DIAGNOSIS — J439 Emphysema, unspecified: Secondary | ICD-10-CM

## 2024-11-25 DIAGNOSIS — E66811 Obesity, class 1: Secondary | ICD-10-CM

## 2024-11-25 NOTE — Assessment & Plan Note (Signed)
 Chronic, ongoing.  A1c today 6.1%, slight trend down from previous with some weight gain. Urine ALB 80 February 2025, continue Ramipril  for kidney protection.  Check BS 2-3 times a day, he is noticing a down trend in sugar levels.  Recommend heavy focus on diabetic diet and maintain current regimen at this time.  Return in 3 months. Praised for this success.  Continue to work with [facility]. - ACE and statin on board - Foot exam up to date, recommend he schedule eye exam. - Vaccines up to date

## 2024-11-25 NOTE — Assessment & Plan Note (Signed)
 Chronic, ongoing.  History of smoking.  Levels >80% past check.  Continue current inhaler regimen and adjust as needed.  Recommend he consistently use his Breztri  and not miss doses as may benefit his SOB, educated him on this.  Recommend he reschedule with pulmonary for further testing due to SOB.

## 2024-11-25 NOTE — Assessment & Plan Note (Signed)
 Chronic, stable.  BP remains well below goal.  Will continue Ramipril  2.5 MG daily, educated him on this (maintain for kidney protection), and maintain rest of regimen as is.  Recommend he monitor BP at least a few mornings a week at home and document.  DASH diet at home.  Labs today: CBC, CMP.  Return in 3 months.

## 2024-11-25 NOTE — Assessment & Plan Note (Signed)
 Chronic, ongoing - recent labs stable.  At this time continue Ramipril  for kidney protection.  CMP today.

## 2024-11-25 NOTE — Assessment & Plan Note (Signed)
 Refer to diabetes with obesity plan of care.

## 2024-11-25 NOTE — Assessment & Plan Note (Signed)
 Chronic, ongoing.  Continue current medication regimen and adjust as needed. Lipid panel today.

## 2024-11-25 NOTE — Assessment & Plan Note (Signed)
 BMI 37.11 with underlying T2DM, HTN/HLD.  Recommended eating smaller high protein, low fat meals more frequently and exercising 30 mins a day 5 times a week with a goal of 10-15lb weight loss in the next 3 months. Patient voiced their understanding and motivation to adhere to these recommendations.

## 2024-11-25 NOTE — Assessment & Plan Note (Signed)
 Chronic with intermittent episodes of low levels presenting, has received infusions in past and had GI work-up. Not taking supplement as recommended.  Recheck labs today and restart as needed.  Goal is to avoid infusions if possible, but he will return if needed to see hematology.  Denies any recent bleeding.

## 2024-11-25 NOTE — Progress Notes (Signed)
 BP 121/72 (BP Location: Left Arm, Patient Position: Sitting, Cuff Size: Large)   Pulse 83   Temp 97.9 F (36.6 C) (Oral)   Resp 17   Ht 5' 7.99 (1.727 m)   Wt 244 lb (110.7 kg)   SpO2 98%   BMI 37.11 kg/m    Subjective:    Patient ID: Ernest [name], male    DOB: 05/19/1951, 73 y.o.   MRN: [id number]  HPI: Ernest [name] is a 73 y.o. male  Chief Complaint  Patient presents with   Diabetes    Had not been able to find his BG monitor but found last night. Diet not the best.    HTN/HLD   Atrial Fibrillation    See's cardiology on 12/19 after being on 2 week holter monitor.    COPD    Feels he is at his baseline with his breathing.    Anemia   DIABETES August A1c 6.3%. Takes Metformin  XR 500 MG BID, Ozempic  0.5 MG weekly, and Farxiga  10 MG daily.  Higher doses of Metformin  caused GI issues. Continues Gabapentin  1200 MG for RLS at night taken. Cannot sleep without this on board.  Works with [employer] [facility] for medications and assistance -- Breztri  & Farxiga .  Hypoglycemic episodes:no Polydipsia/polyuria: no Visual disturbance: no Chest pain: no Paresthesias: no Glucose Monitoring: yes  Accucheck frequency: lost monitor, but found it last night  Fasting glucose:   Post prandial:  Evening:  Before meals: Taking Insulin ?: no  Long acting insulin :  Short acting insulin : Blood Pressure Monitoring: weekly Retinal Examination: Not Up to Date -- [employer] Exam: Up to Date Pneumovax: Up to Date Influenza: Up to Date Aspirin : no   HYPERTENSION / HYPERLIPIDEMIA Taking Imdur , Lasix , Cardizem , Zetia , Atorvastatin , Ramipril , Metoprolol . Last cardiology visit was on 10/06/24 when they increased his Toprol  XL dose and was ordered Holter due to a-fib issues. Has not scheduled with pulmonary yet, as recommended by cardiology in the past. Has CPAP at home but does not use as wakes up with it on the floor all the time. Satisfied with current treatment? yes Duration  of hypertension: chronic BP monitoring frequency: not checking BP range:  BP medication side effects: no Duration of hyperlipidemia: chronic Cholesterol medication side effects: no Cholesterol supplements: none Medication compliance: good compliance Aspirin : no Recent stressors: no Recurrent headaches: no Visual changes: no Palpitations: no Dyspnea: baseline, uses Albuterol  Chest pain: occasional little pain that will go away quickly Lower extremity edema: no Dizzy/lightheaded: no  ATRIAL FIBRILLATION Continues Xarelto  and Metoprolol . Atrial fibrillation status: stable Satisfied with current treatment: yes  Medication side effects:  no Medication compliance: good compliance Etiology of atrial fibrillation:  Palpitations: no Chest pain: occasional little pain that will go away quickly Dyspnea on exertion:  as above Orthopnea:  no Syncope:  no Edema:  no Ventricular rate control: B-blocker Anti-coagulation: long acting   CHRONIC KIDNEY DISEASE (Stage 3a) CKD status: stable Medications renally dose: yes Previous renal evaluation: no Pneumovax:  Up to Date Influenza Vaccine:  Up to Date   COPD Using Breztri  and Albuterol . Smoked for 30 years ago, smoked about 4 PPD, got hypnotized and quit. COPD status: stable Satisfied with current treatment?: yes Oxygen use: no Dyspnea frequency: as above Cough frequency: no Rescue inhaler frequency: rarely Limitation of activity: no Productive cough: no Last Spirometry: 07/04/22 --- FEV1/FVC >80% and FEV1 similar Pneumovax: Up to Date Influenza: Up to Date   ANEMIA No current supplements. Took in past. Had  infusions in the past. Anemia status: stable Etiology of anemia: unknown Duration of anemia treatment: chronic Compliance with treatment: good compliance Iron  supplementation side effects: no Severity of anemia: mild Fatigue: no Decreased exercise tolerance: no  Dyspnea on exertion: no Palpitations: no Bleeding:  no Pica: no   Relevant past medical, surgical, family and social history reviewed and updated as indicated. Interim medical history since our last visit reviewed. Allergies and medications reviewed and updated.  Review of Systems  Constitutional:  Negative for activity change, diaphoresis, fatigue and fever.  Respiratory:  Positive for shortness of breath (at baseline). Negative for cough, chest tightness and wheezing.   Cardiovascular:  Positive for chest pain (occasional brief episodes). Negative for palpitations and leg swelling.  Gastrointestinal: Negative.   Endocrine: Negative for cold intolerance, heat intolerance, polydipsia, polyphagia and polyuria.  Neurological: Negative.   Psychiatric/Behavioral: Negative.     Per HPI unless specifically indicated above     Objective:    BP 121/72 (BP Location: Left Arm, Patient Position: Sitting, Cuff Size: Large)   Pulse 83   Temp 97.9 F (36.6 C) (Oral)   Resp 17   Ht 5' 7.99 (1.727 m)   Wt 244 lb (110.7 kg)   SpO2 98%   BMI 37.11 kg/m   Wt Readings from Last 3 Encounters:  11/25/24 244 lb (110.7 kg)  10/06/24 242 lb (109.8 kg)  08/04/24 240 lb 6.4 oz (109 kg)    Physical Exam Vitals and nursing note reviewed.  Constitutional:      General: He is awake. He is not in acute distress.    Appearance: He is well-developed and well-groomed. He is obese. He is not ill-appearing or toxic-appearing.  HENT:     Head: Normocephalic.     Right Ear: Hearing and external ear normal.     Left Ear: Hearing and external ear normal.  Eyes:     General: Lids are normal.     Extraocular Movements: Extraocular movements intact.     Conjunctiva/sclera: Conjunctivae normal.  Neck:     Thyroid : No thyromegaly.     Vascular: No carotid bruit.  Cardiovascular:     Rate and Rhythm: Normal rate and regular rhythm.     Heart sounds: Normal heart sounds. No murmur heard.    No gallop.  Pulmonary:     Effort: No accessory muscle usage or  respiratory distress.     Breath sounds: Normal breath sounds.  Abdominal:     General: Bowel sounds are normal. There is no distension.     Palpations: Abdomen is soft.     Tenderness: There is no abdominal tenderness.  Musculoskeletal:     Cervical back: Full passive range of motion without pain.     Right lower leg: No edema.     Left lower leg: No edema.  Lymphadenopathy:     Cervical: No cervical adenopathy.  Skin:    General: Skin is warm.     Capillary Refill: Capillary refill takes less than 2 seconds.  Neurological:     Mental Status: He is alert and oriented to person, place, and time.     Deep Tendon Reflexes: Reflexes are normal and symmetric.     Reflex Scores:      Brachioradialis reflexes are 2+ on the right side and 2+ on the left side.      Patellar reflexes are 2+ on the right side and 2+ on the left side. Psychiatric:        Attention  and Perception: Attention normal.        Mood and Affect: Mood normal.        Speech: Speech normal.        Behavior: Behavior normal. Behavior is cooperative.        Thought Content: Thought content normal.    Results for orders placed or performed in visit on 08/04/24  Bayer DCA Hb A1c Waived   Collection Time: 08/04/24  2:05 PM  Result Value Ref Range   HB A1C (BAYER DCA - WAIVED) 6.3 (H) 4.8 - 5.6 %  Ferritin   Collection Time: 08/04/24  2:07 PM  Result Value Ref Range   Ferritin 59 30 - 400 ng/mL  Iron    Collection Time: 08/04/24  2:07 PM  Result Value Ref Range   Iron  63 38 - 169 ug/dL  CBC with Differential/Platelet   Collection Time: 08/04/24  2:07 PM  Result Value Ref Range   WBC 5.1 3.4 - 10.8 x10E3/uL   RBC 5.18 4.14 - 5.80 x10E6/uL   Hemoglobin 13.8 13.0 - 17.7 g/dL   Hematocrit 55.5 62.4 - 51.0 %   MCV 86 79 - 97 fL   MCH 26.6 26.6 - 33.0 pg   MCHC 31.1 (L) 31.5 - 35.7 g/dL   RDW 85.7 88.3 - 84.5 %   Platelets 165 150 - 450 x10E3/uL   Neutrophils 52 Not Estab. %   Lymphs 34 Not Estab. %   Monocytes  9 Not Estab. %   Eos 4 Not Estab. %   Basos 1 Not Estab. %   Neutrophils Absolute 2.7 1.4 - 7.0 x10E3/uL   Lymphocytes Absolute 1.8 0.7 - 3.1 x10E3/uL   Monocytes Absolute 0.5 0.1 - 0.9 x10E3/uL   EOS (ABSOLUTE) 0.2 0.0 - 0.4 x10E3/uL   Basophils Absolute 0.1 0.0 - 0.2 x10E3/uL   Immature Granulocytes 0 Not Estab. %   Immature Grans (Abs) 0.0 0.0 - 0.1 x10E3/uL  Comprehensive metabolic panel with GFR   Collection Time: 08/04/24  2:07 PM  Result Value Ref Range   Glucose 140 (H) 70 - 99 mg/dL   BUN 18 8 - 27 mg/dL   Creatinine, Ser 8.70 (H) 0.76 - 1.27 mg/dL   eGFR 58 (L) >40 fO/fpw/8.26   BUN/Creatinine Ratio 14 10 - 24   Sodium 140 134 - 144 mmol/L   Potassium 4.4 3.5 - 5.2 mmol/L   Chloride 101 96 - 106 mmol/L   CO2 23 20 - 29 mmol/L   Calcium  10.0 8.6 - 10.2 mg/dL   Total Protein 6.7 6.0 - 8.5 g/dL   Albumin 4.6 3.8 - 4.8 g/dL   Globulin, Total 2.1 1.5 - 4.5 g/dL   Bilirubin Total 2.1 (H) 0.0 - 1.2 mg/dL   Alkaline Phosphatase 89 44 - 121 IU/L   AST 23 0 - 40 IU/L   ALT 25 0 - 44 IU/L  Lipid Panel w/o Chol/HDL Ratio   Collection Time: 08/04/24  2:07 PM  Result Value Ref Range   Cholesterol, Total 91 (L) 100 - 199 mg/dL   Triglycerides 826 (H) 0 - 149 mg/dL   HDL 32 (L) >60 mg/dL   VLDL Cholesterol Cal 29 5 - 40 mg/dL   LDL Chol Calc (NIH) 30 0 - 99 mg/dL      Assessment & Plan:   Problem List Items Addressed This Visit       Cardiovascular and Mediastinum   Hypertension associated with diabetes (HCC)   Chronic, stable.  BP remains well  below goal.  Will continue Ramipril  2.5 MG daily, educated him on this (maintain for kidney protection), and maintain rest of regimen as is.  Recommend he monitor BP at least a few mornings a week at home and document.  DASH diet at home.  Labs today: CBC, CMP.  Return in 3 months.       Relevant Orders   Comprehensive metabolic panel with GFR   Bayer DCA Hb A1c Waived   Atrial fibrillation (HCC)   Chronic, ongoing.  Rate  controlled.  Continue collaboration with cardiology and current medication regimen as ordered by them.  Recent note reviewed and medication changes.      Relevant Orders   Comprehensive metabolic panel with GFR     Respiratory   OSA on CPAP   Chronic, no use of CPAP.  Recommend he use 100% of the time.      COPD (chronic obstructive pulmonary disease) with emphysema (HCC)   Chronic, ongoing.  History of smoking.  Levels >80% past check.  Continue current inhaler regimen and adjust as needed.  Recommend he consistently use his Breztri  and not miss doses as may benefit his SOB, educated him on this.  Recommend he reschedule with pulmonary for further testing due to SOB.        Endocrine   Type 2 diabetes mellitus with morbid obesity (HCC) - Primary   Chronic, ongoing.  A1c today 6.1%, slight trend down from previous with some weight gain. Urine ALB 80 February 2025, continue Ramipril  for kidney protection.  Check BS 2-3 times a day, he is noticing a down trend in sugar levels.  Recommend heavy focus on diabetic diet and maintain current regimen at this time.  Return in 3 months. Praised for this success.  Continue to work with [facility]. - ACE and statin on board - Foot exam up to date, recommend he schedule eye exam. - Vaccines up to date      Relevant Orders   Bayer DCA Hb A1c Waived   Hyperlipidemia associated with type 2 diabetes mellitus (HCC)   Chronic, ongoing.  Continue current medication regimen and adjust as needed.  Lipid panel today.         Relevant Orders   Comprehensive metabolic panel with GFR   Lipid Panel w/o Chol/HDL Ratio   Bayer DCA Hb A1c Waived   Diabetes mellitus treated with injections of non-insulin  medication (HCC)   Refer to diabetes with obesity plan of care.      Relevant Orders   Bayer DCA Hb A1c Waived     Genitourinary   Stage 3a chronic kidney disease (CKD) (HCC)   Chronic, ongoing - recent labs stable.  At this time continue Ramipril  for  kidney protection.  CMP today.        Relevant Orders   Comprehensive metabolic panel with GFR     Other   Restless legs   Chronic, ongoing.  Continue Gabapentin  at HS, which offers benefit.  Continue B12 supplement at home. Renal dose Gabapentin  as needed.      Obesity   BMI 37.11 with underlying T2DM, HTN/HLD.  Recommended eating smaller high protein, low fat meals more frequently and exercising 30 mins a day 5 times a week with a goal of 10-15lb weight loss in the next 3 months. Patient voiced their understanding and motivation to adhere to these recommendations.       Iron  deficiency anemia due to chronic blood loss   Chronic with intermittent episodes of  low levels presenting, has received infusions in past and had GI work-up. Not taking supplement as recommended.  Recheck labs today and restart as needed.  Goal is to avoid infusions if possible, but he will return if needed to see hematology.  Denies any recent bleeding.      Relevant Orders   Ferritin   Iron    CBC with Differential/Platelet   Other Visit Diagnoses       Flu vaccine need       Flu vaccine today, educated patient.        Follow up plan: Return in about 3 months (around 02/23/2025) for Annual Physical with diabetes check.

## 2024-11-25 NOTE — Assessment & Plan Note (Signed)
 Chronic, ongoing.  Continue Gabapentin  at HS, which offers benefit.  Continue B12 supplement at home. Renal dose Gabapentin  as needed.

## 2024-11-25 NOTE — Assessment & Plan Note (Addendum)
 Chronic, ongoing.  Rate controlled.  Continue collaboration with cardiology and current medication regimen as ordered by them.  Recent note reviewed and medication changes.

## 2024-11-25 NOTE — Assessment & Plan Note (Signed)
Chronic, no use of CPAP.  Recommend he use 100% of the time.

## 2024-11-26 ENCOUNTER — Ambulatory Visit: Payer: Self-pay | Admitting: Nurse Practitioner

## 2024-11-26 LAB — COMPREHENSIVE METABOLIC PANEL WITH GFR
ALT: 25 IU/L (ref 0–44)
AST: 20 IU/L (ref 0–40)
Albumin: 4.4 g/dL (ref 3.8–4.8)
Alkaline Phosphatase: 100 IU/L (ref 47–123)
BUN/Creatinine Ratio: 13 (ref 10–24)
BUN: 16 mg/dL (ref 8–27)
Bilirubin Total: 1.4 mg/dL — ABNORMAL HIGH (ref 0.0–1.2)
CO2: 26 mmol/L (ref 20–29)
Calcium: 10.1 mg/dL (ref 8.6–10.2)
Chloride: 101 mmol/L (ref 96–106)
Creatinine, Ser: 1.2 mg/dL (ref 0.76–1.27)
Globulin, Total: 2.1 g/dL (ref 1.5–4.5)
Glucose: 113 mg/dL — ABNORMAL HIGH (ref 70–99)
Potassium: 4.5 mmol/L (ref 3.5–5.2)
Sodium: 142 mmol/L (ref 134–144)
Total Protein: 6.5 g/dL (ref 6.0–8.5)
eGFR: 63 mL/min/1.73 (ref >59)

## 2024-11-26 LAB — CBC WITH DIFFERENTIAL/PLATELET
Basophils Absolute: 0 x10E3/uL (ref 0.0–0.2)
Basos: 1 %
EOS (ABSOLUTE): 0.1 x10E3/uL (ref 0.0–0.4)
Eos: 3 %
Hematocrit: 39.8 % (ref 37.5–51.0)
Hemoglobin: 12.3 g/dL — ABNORMAL LOW (ref 13.0–17.7)
Immature Grans (Abs): 0 x10E3/uL (ref 0.0–0.1)
Immature Granulocytes: 0 %
Lymphocytes Absolute: 1.8 x10E3/uL (ref 0.7–3.1)
Lymphs: 38 %
MCH: 25.7 pg — ABNORMAL LOW (ref 26.6–33.0)
MCHC: 30.9 g/dL — ABNORMAL LOW (ref 31.5–35.7)
MCV: 83 fL (ref 79–97)
Monocytes Absolute: 0.4 x10E3/uL (ref 0.1–0.9)
Monocytes: 9 %
Neutrophils Absolute: 2.3 x10E3/uL (ref 1.4–7.0)
Neutrophils: 49 %
Platelets: 174 x10E3/uL (ref 150–450)
RBC: 4.79 x10E6/uL (ref 4.14–5.80)
RDW: 14.3 % (ref 11.6–15.4)
WBC: 4.7 x10E3/uL (ref 3.4–10.8)

## 2024-11-26 LAB — LIPID PANEL W/O CHOL/HDL RATIO
Cholesterol, Total: 66 mg/dL — ABNORMAL LOW (ref 100–199)
HDL: 30 mg/dL — ABNORMAL LOW (ref >39)
LDL Chol Calc (NIH): 16 mg/dL (ref 0–99)
Triglycerides: 105 mg/dL (ref 0–149)
VLDL Cholesterol Cal: 20 mg/dL (ref 5–40)

## 2024-11-26 LAB — BAYER DCA HB A1C WAIVED: HB A1C (BAYER DCA - WAIVED): 6.1 % — ABNORMAL HIGH (ref 4.8–5.6)

## 2024-11-26 LAB — IRON: Iron: 46 ug/dL (ref 38–169)

## 2024-11-26 LAB — FERRITIN: Ferritin: 35 ng/mL (ref 30–400)

## 2024-11-26 NOTE — Progress Notes (Signed)
 Good afternoon, please let Cheryl know his labs have returned: - Kidney and liver function are normal. - Lipid panel shows levels well at goal, continue statin therapy. - CBC shows hemoglobin trended down a little and iron  is in normal range, but low normal and has trended down some. Please take your iron  supplement at least 3 days a week. Any questions? Keep being amazing!!  Thank you for allowing me to participate in your care.  I appreciate you. Kindest regards, Stoy Fenn
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
## 2024-12-01 ENCOUNTER — Other Ambulatory Visit: Payer: Self-pay

## 2024-12-01 ENCOUNTER — Other Ambulatory Visit (HOSPITAL_COMMUNITY): Payer: Self-pay

## 2024-12-05 ENCOUNTER — Ambulatory Visit: Attending: Physician Assistant | Admitting: Physician Assistant

## 2024-12-05 ENCOUNTER — Encounter: Payer: Self-pay | Admitting: Physician Assistant

## 2024-12-05 VITALS — BP 130/65 | HR 69 | Ht 68.0 in | Wt 247.0 lb

## 2024-12-05 DIAGNOSIS — I2510 Atherosclerotic heart disease of native coronary artery without angina pectoris: Secondary | ICD-10-CM | POA: Diagnosis not present

## 2024-12-05 DIAGNOSIS — E785 Hyperlipidemia, unspecified: Secondary | ICD-10-CM

## 2024-12-05 DIAGNOSIS — I10 Essential (primary) hypertension: Secondary | ICD-10-CM | POA: Diagnosis not present

## 2024-12-05 DIAGNOSIS — I4821 Permanent atrial fibrillation: Secondary | ICD-10-CM

## 2024-12-05 NOTE — Patient Instructions (Signed)
 Medication Instructions:  Your physician recommends that you continue on your current medications as directed. Please refer to the Current Medication list given to you today.   *If you need a refill on your cardiac medications before your next appointment, please call your pharmacy*  Lab Work: None ordered at this time   Follow-Up: At [facility], you and your health needs are our priority.  As part of our continuing mission to provide you with exceptional heart care, our providers are all part of one team.  This team includes your primary Cardiologist (physician) and Advanced Practice Providers or APPs (Physician Assistants and Nurse Practitioners) who all work together to provide you with the care you need, when you need it.  Your next appointment:   6 month(s)  Provider:   You may see Timothy Gollan, MD or Bernardino Bring, PA-C  We recommend signing up for the patient portal called MyChart.  Sign up information is provided on this After Visit Summary.  MyChart is used to connect with patients for Virtual Visits (Telemedicine).  Patients are able to view lab/test results, encounter notes, upcoming appointments, etc.  Non-urgent messages can be sent to your provider as well.   To learn more about what you can do with MyChart, go to [email or web address].

## 2024-12-16 ENCOUNTER — Other Ambulatory Visit (HOSPITAL_COMMUNITY): Payer: Self-pay

## 2024-12-19 ENCOUNTER — Other Ambulatory Visit (HOSPITAL_COMMUNITY): Payer: Self-pay

## 2024-12-30 ENCOUNTER — Encounter: Payer: Self-pay | Admitting: Nurse Practitioner

## 2025-01-13 ENCOUNTER — Telehealth: Payer: Self-pay

## 2025-01-13 NOTE — Progress Notes (Signed)
 Pharmacy Quality Measure Review  This patient is appearing on a report for being at risk of failing the adherence measure for diabetes medications this calendar year.   Medication: Ozempic  2mg /63mL Inh Last fill date: 12/02/2024 for 84 day supply  Patient is currently adherent. Next refill will be due on 02/24/2025.   Lisle Slocumb Student - PharmD.

## 2025-01-15 ENCOUNTER — Other Ambulatory Visit: Payer: Self-pay

## 2025-01-15 ENCOUNTER — Other Ambulatory Visit (HOSPITAL_COMMUNITY): Payer: Self-pay

## 2025-01-15 ENCOUNTER — Other Ambulatory Visit: Payer: Self-pay | Admitting: Nurse Practitioner

## 2025-01-15 MED ORDER — RIVAROXABAN 20 MG PO TABS
20.0000 mg | ORAL_TABLET | Freq: Every evening | ORAL | 4 refills | Status: AC
  Filled 2025-01-15 – 2025-01-16 (×2): qty 30, 30d supply, fill #0

## 2025-01-16 ENCOUNTER — Other Ambulatory Visit: Payer: Self-pay

## 2025-01-16 ENCOUNTER — Other Ambulatory Visit (HOSPITAL_COMMUNITY): Payer: Self-pay

## 2025-01-19 ENCOUNTER — Other Ambulatory Visit (HOSPITAL_COMMUNITY): Payer: Self-pay

## 2025-02-18 ENCOUNTER — Encounter: Admitting: Nurse Practitioner

## 2025-02-24 ENCOUNTER — Encounter: Admitting: Nurse Practitioner

## 2025-03-31 ENCOUNTER — Ambulatory Visit

## 2025-04-20 ENCOUNTER — Other Ambulatory Visit

## 2025-06-08 ENCOUNTER — Ambulatory Visit: Admitting: Physician Assistant
